# Patient Record
Sex: Female | Born: 1935
Health system: Southern US, Community
[De-identification: ages and names within clinical notes are randomized; demographics above are authoritative.]

## PROBLEM LIST (undated history)

## (undated) DIAGNOSIS — H409 Unspecified glaucoma: Secondary | ICD-10-CM

## (undated) DIAGNOSIS — J449 Chronic obstructive pulmonary disease, unspecified: Secondary | ICD-10-CM

## (undated) DIAGNOSIS — M1990 Unspecified osteoarthritis, unspecified site: Secondary | ICD-10-CM

## (undated) DIAGNOSIS — M79669 Pain in unspecified lower leg: Secondary | ICD-10-CM

## (undated) DIAGNOSIS — I739 Peripheral vascular disease, unspecified: Secondary | ICD-10-CM

## (undated) DIAGNOSIS — D509 Iron deficiency anemia, unspecified: Secondary | ICD-10-CM

## (undated) DIAGNOSIS — E119 Type 2 diabetes mellitus without complications: Secondary | ICD-10-CM

## (undated) DIAGNOSIS — C801 Malignant (primary) neoplasm, unspecified: Secondary | ICD-10-CM

## (undated) DIAGNOSIS — L930 Discoid lupus erythematosus: Secondary | ICD-10-CM

## (undated) DIAGNOSIS — J45909 Unspecified asthma, uncomplicated: Secondary | ICD-10-CM

## (undated) DIAGNOSIS — G4730 Sleep apnea, unspecified: Secondary | ICD-10-CM

## (undated) DIAGNOSIS — K219 Gastro-esophageal reflux disease without esophagitis: Secondary | ICD-10-CM

## (undated) DIAGNOSIS — I639 Cerebral infarction, unspecified: Secondary | ICD-10-CM

## (undated) DIAGNOSIS — M7989 Other specified soft tissue disorders: Secondary | ICD-10-CM

## (undated) DIAGNOSIS — J349 Unspecified disorder of nose and nasal sinuses: Secondary | ICD-10-CM

## (undated) DIAGNOSIS — E785 Hyperlipidemia, unspecified: Secondary | ICD-10-CM

## (undated) DIAGNOSIS — I10 Essential (primary) hypertension: Secondary | ICD-10-CM

## (undated) DIAGNOSIS — M2550 Pain in unspecified joint: Secondary | ICD-10-CM

## (undated) HISTORY — DX: Unspecified osteoarthritis, unspecified site: M19.90

## (undated) HISTORY — DX: Pain in unspecified lower leg: M79.669

## (undated) HISTORY — DX: Chronic obstructive pulmonary disease, unspecified: J44.9

## (undated) HISTORY — DX: Type 2 diabetes mellitus without complications: E11.9

## (undated) HISTORY — DX: Cerebral infarction, unspecified: I63.9

## (undated) HISTORY — DX: Iron deficiency anemia, unspecified: D50.9

## (undated) HISTORY — PX: EYE SURGERY: SHX253

## (undated) HISTORY — PX: BREAST SURGERY: SHX581

## (undated) HISTORY — DX: Sleep apnea, unspecified: G47.30

## (undated) HISTORY — DX: Hyperlipidemia, unspecified: E78.5

## (undated) HISTORY — DX: Discoid lupus erythematosus: L93.0

## (undated) HISTORY — DX: Essential (primary) hypertension: I10

## (undated) HISTORY — DX: Peripheral vascular disease, unspecified: I73.9

## (undated) HISTORY — DX: Gastro-esophageal reflux disease without esophagitis: K21.9

## (undated) HISTORY — PX: STENT PLACEMENT VASCULAR (ARMC HX): HXRAD1737

## (undated) HISTORY — PX: CAROTID ENDARTERECTOMY: SUR193

## (undated) HISTORY — DX: Unspecified disorder of nose and nasal sinuses: J34.9

## (undated) HISTORY — DX: Pain in unspecified joint: M25.50

## (undated) HISTORY — PX: CORONARY STENT PLACEMENT: SHX1402

## (undated) HISTORY — PX: TUBAL LIGATION: SHX77

## (undated) HISTORY — DX: Unspecified glaucoma: H40.9

## (undated) HISTORY — DX: Unspecified asthma, uncomplicated: J45.909

## (undated) HISTORY — DX: Other specified soft tissue disorders: M79.89

---

## 1985-01-08 DIAGNOSIS — I639 Cerebral infarction, unspecified: Secondary | ICD-10-CM

## 1985-01-08 HISTORY — DX: Cerebral infarction, unspecified: I63.9

## 2003-07-19 ENCOUNTER — Other Ambulatory Visit: Payer: Self-pay

## 2004-05-24 ENCOUNTER — Ambulatory Visit: Payer: Self-pay | Admitting: Surgery

## 2005-04-16 ENCOUNTER — Ambulatory Visit: Payer: Self-pay

## 2005-05-28 ENCOUNTER — Ambulatory Visit: Payer: Self-pay | Admitting: Internal Medicine

## 2005-12-24 ENCOUNTER — Ambulatory Visit (HOSPITAL_COMMUNITY): Admission: RE | Admit: 2005-12-24 | Discharge: 2005-12-24 | Payer: Self-pay | Admitting: *Deleted

## 2006-02-22 ENCOUNTER — Ambulatory Visit: Payer: Self-pay | Admitting: Internal Medicine

## 2006-03-13 ENCOUNTER — Ambulatory Visit (HOSPITAL_COMMUNITY): Admission: RE | Admit: 2006-03-13 | Discharge: 2006-03-14 | Payer: Self-pay | Admitting: *Deleted

## 2006-03-15 ENCOUNTER — Ambulatory Visit: Payer: Self-pay | Admitting: *Deleted

## 2006-04-02 ENCOUNTER — Ambulatory Visit: Payer: Self-pay | Admitting: Gastroenterology

## 2006-06-20 ENCOUNTER — Ambulatory Visit: Payer: Self-pay | Admitting: Family Medicine

## 2006-09-19 ENCOUNTER — Ambulatory Visit: Payer: Self-pay | Admitting: Gastroenterology

## 2007-01-10 ENCOUNTER — Ambulatory Visit: Payer: Self-pay | Admitting: Internal Medicine

## 2007-01-27 ENCOUNTER — Ambulatory Visit: Payer: Self-pay | Admitting: Family Medicine

## 2007-02-21 ENCOUNTER — Other Ambulatory Visit: Payer: Self-pay

## 2007-02-21 ENCOUNTER — Inpatient Hospital Stay: Payer: Self-pay | Admitting: Internal Medicine

## 2007-04-30 ENCOUNTER — Ambulatory Visit: Payer: Self-pay | Admitting: Internal Medicine

## 2007-05-27 ENCOUNTER — Ambulatory Visit: Payer: Self-pay | Admitting: Vascular Surgery

## 2007-07-22 ENCOUNTER — Ambulatory Visit: Payer: Self-pay | Admitting: Internal Medicine

## 2007-11-13 ENCOUNTER — Ambulatory Visit: Payer: Self-pay | Admitting: Internal Medicine

## 2008-07-17 ENCOUNTER — Inpatient Hospital Stay: Payer: Self-pay | Admitting: Internal Medicine

## 2008-09-09 ENCOUNTER — Ambulatory Visit: Payer: Self-pay | Admitting: Internal Medicine

## 2009-02-17 ENCOUNTER — Ambulatory Visit: Payer: Self-pay | Admitting: Gastroenterology

## 2009-04-11 ENCOUNTER — Ambulatory Visit: Payer: Self-pay | Admitting: Gastroenterology

## 2009-09-22 ENCOUNTER — Ambulatory Visit: Payer: Self-pay | Admitting: Internal Medicine

## 2010-03-13 ENCOUNTER — Ambulatory Visit: Payer: Self-pay | Admitting: Internal Medicine

## 2010-10-10 ENCOUNTER — Ambulatory Visit: Payer: Self-pay | Admitting: Internal Medicine

## 2011-01-09 HISTORY — PX: CATARACT EXTRACTION: SUR2

## 2011-07-26 ENCOUNTER — Ambulatory Visit: Payer: Self-pay | Admitting: Ophthalmology

## 2011-07-26 LAB — HEMOGLOBIN: HGB: 11.4 g/dL — ABNORMAL LOW (ref 12.0–16.0)

## 2011-08-06 ENCOUNTER — Ambulatory Visit: Payer: Self-pay | Admitting: Ophthalmology

## 2011-09-27 ENCOUNTER — Emergency Department: Payer: Self-pay | Admitting: Emergency Medicine

## 2011-09-27 LAB — CBC WITH DIFFERENTIAL/PLATELET
Basophil #: 0.1 10*3/uL (ref 0.0–0.1)
Eosinophil #: 0.1 10*3/uL (ref 0.0–0.7)
HCT: 33.6 % — ABNORMAL LOW (ref 35.0–47.0)
HGB: 10.7 g/dL — ABNORMAL LOW (ref 12.0–16.0)
Lymphocyte #: 2.5 10*3/uL (ref 1.0–3.6)
Lymphocyte %: 22.6 %
MCH: 25 pg — ABNORMAL LOW (ref 26.0–34.0)
MCHC: 32 g/dL (ref 32.0–36.0)
MCV: 78 fL — ABNORMAL LOW (ref 80–100)
Monocyte #: 1 x10 3/mm — ABNORMAL HIGH (ref 0.2–0.9)
Monocyte %: 8.6 %
Neutrophil #: 7.5 10*3/uL — ABNORMAL HIGH (ref 1.4–6.5)
Neutrophil %: 67.1 %
Platelet: 257 10*3/uL (ref 150–440)
RBC: 4.29 10*6/uL (ref 3.80–5.20)
RDW: 16.2 % — ABNORMAL HIGH (ref 11.5–14.5)

## 2011-09-27 LAB — COMPREHENSIVE METABOLIC PANEL
Anion Gap: 8 (ref 7–16)
BUN: 24 mg/dL — ABNORMAL HIGH (ref 7–18)
Calcium, Total: 9.6 mg/dL (ref 8.5–10.1)
Chloride: 108 mmol/L — ABNORMAL HIGH (ref 98–107)
Co2: 26 mmol/L (ref 21–32)
EGFR (African American): >60
EGFR (Non-African Amer.): >60
Potassium: 3.9 mmol/L (ref 3.5–5.1)
SGOT(AST): 14 U/L — ABNORMAL LOW (ref 15–37)
Sodium: 142 mmol/L (ref 136–145)
Total Protein: 7.8 g/dL (ref 6.4–8.2)

## 2011-10-15 ENCOUNTER — Ambulatory Visit: Payer: Self-pay

## 2012-10-15 ENCOUNTER — Ambulatory Visit: Payer: Self-pay

## 2013-02-19 ENCOUNTER — Encounter: Payer: Self-pay | Admitting: Podiatrist

## 2013-03-05 ENCOUNTER — Ambulatory Visit: Payer: Self-pay | Admitting: Podiatrist

## 2013-03-19 ENCOUNTER — Ambulatory Visit (INDEPENDENT_AMBULATORY_CARE_PROVIDER_SITE_OTHER): Payer: Medicare HMO

## 2013-03-19 ENCOUNTER — Encounter: Payer: Self-pay | Admitting: Podiatrist

## 2013-03-19 ENCOUNTER — Ambulatory Visit (INDEPENDENT_AMBULATORY_CARE_PROVIDER_SITE_OTHER): Payer: Medicare HMO | Admitting: Podiatrist

## 2013-03-19 VITALS — BP 178/69 | HR 59 | Resp 16 | Ht 67.0 in | Wt 222.0 lb

## 2013-03-19 DIAGNOSIS — M79609 Pain in unspecified limb: Secondary | ICD-10-CM

## 2013-03-19 DIAGNOSIS — B351 Tinea unguium: Secondary | ICD-10-CM

## 2013-03-19 DIAGNOSIS — G629 Polyneuropathy, unspecified: Secondary | ICD-10-CM

## 2013-03-19 DIAGNOSIS — L84 Corns and callosities: Secondary | ICD-10-CM

## 2013-03-19 DIAGNOSIS — M79673 Pain in unspecified foot: Secondary | ICD-10-CM

## 2013-03-19 DIAGNOSIS — M216X9 Other acquired deformities of unspecified foot: Secondary | ICD-10-CM

## 2013-03-19 DIAGNOSIS — M19079 Primary osteoarthritis, unspecified ankle and foot: Secondary | ICD-10-CM

## 2013-03-19 DIAGNOSIS — G589 Mononeuropathy, unspecified: Secondary | ICD-10-CM

## 2013-03-19 MED ORDER — AMITRIPTYLINE HCL 25 MG PO TABS
25.0000 mg | ORAL_TABLET | Freq: Every day | ORAL | Status: DC
Start: 2013-03-19 — End: 2015-12-08

## 2013-03-19 NOTE — Patient Instructions (Signed)
I am starting you on a medication for your neuropathy- it has been called into your pharmacy for you.

## 2013-03-19 NOTE — Progress Notes (Signed)
   Subjective:    Patient ID: Jennifer Carrillo, female    DOB: [birth year], 80 y.o.   MRN: [id number]  HPI Comments: N PAIN L DORSAL B/L/ TRIM TOENAILS AND CALLUS D 4-5 MONTHS O SLOWLY C WORSE A ? T 0  Foot Pain Associated symptoms include abdominal pain and fatigue.      Review of Systems  Constitutional: Positive for appetite change and fatigue.  HENT:       SINUS PROBLEMS HEARING LOSS RINGING IN EARS EAR PAIN  Eyes: Negative.   Respiratory: Positive for shortness of breath.   Cardiovascular: Positive for leg swelling.       CALF PAIN WHEN WALKING  Gastrointestinal: Positive for abdominal pain, diarrhea and blood in stool.  Endocrine: Negative.   Genitourinary: Positive for urgency.  Musculoskeletal:       JOINT PAIN  Skin:       CHANGE IN NAILS  Allergic/Immunologic: Negative.   Neurological: Negative.   Hematological: Negative.   Psychiatric/Behavioral: Negative.        Objective:   Physical Exam Vascular exam reveals palpable pedal pulses are 1/4 DP and PT bilateral. Capillary refill time is within normal limits bilateral. Neurological sensation is decreased epicriticaly to [employer] monofilament at 2/5 sites bilaterally.  She has generalized arthritic changes bilateral feet and ankles which are uncomfortable. No specific area of pinpoint pain is noted. Generalized discomfort metatarsal region bilaterally is present. Patient's toenails are elongated, thickened, discolored, dystrophic, onychomycotic and uncomfortable per the patient. She has hyperkeratotic lesions which are painful and symptomatic bilateral feet as well. Cavus deformity bilaterally noted with forefoot prominence.      Assessment & Plan:  Neuropathy, osteoarthritis, symptomatic mycotic toenails, cavus deformity, plantarflexed metatarsals, porokeratotic lesions  Plan: X-rays were taken and showed no acute abnormalities. Generalized arthritic changes noted. Recommended Elavil and a topical pain  medication for her neuropathy. I debrided her toenails and the lesions without complication. She'll be seen back as needed for followup.

## 2013-05-18 ENCOUNTER — Observation Stay: Payer: Self-pay | Admitting: Internal Medicine

## 2013-05-18 LAB — COMPREHENSIVE METABOLIC PANEL
Albumin: 3.3 g/dL — ABNORMAL LOW (ref 3.4–5.0)
Alkaline Phosphatase: 104 U/L
Anion Gap: 6 — ABNORMAL LOW (ref 7–16)
BILIRUBIN TOTAL: 0.3 mg/dL (ref 0.2–1.0)
BUN: 25 mg/dL — AB (ref 7–18)
CHLORIDE: 109 mmol/L — AB (ref 98–107)
CO2: 28 mmol/L (ref 21–32)
Calcium, Total: 9.4 mg/dL (ref 8.5–10.1)
Creatinine: 1.35 mg/dL — ABNORMAL HIGH (ref 0.60–1.30)
GFR CALC AF AMER: 44 — AB
GFR CALC NON AF AMER: 38 — AB
GLUCOSE: 213 mg/dL — AB (ref 65–99)
OSMOLALITY: 296 (ref 275–301)
Potassium: 4.3 mmol/L (ref 3.5–5.1)
SGOT(AST): 21 U/L (ref 15–37)
SGPT (ALT): 17 U/L (ref 12–78)
SODIUM: 143 mmol/L (ref 136–145)
TOTAL PROTEIN: 8 g/dL (ref 6.4–8.2)

## 2013-05-18 LAB — CBC
HCT: 34 % — ABNORMAL LOW (ref 35.0–47.0)
HGB: 10.7 g/dL — ABNORMAL LOW (ref 12.0–16.0)
MCH: 24.4 pg — ABNORMAL LOW (ref 26.0–34.0)
MCHC: 31.6 g/dL — AB (ref 32.0–36.0)
MCV: 77 fL — ABNORMAL LOW (ref 80–100)
Platelet: 261 10*3/uL (ref 150–440)
RBC: 4.4 10*6/uL (ref 3.80–5.20)
RDW: 17 % — ABNORMAL HIGH (ref 11.5–14.5)
WBC: 8.8 10*3/uL (ref 3.6–11.0)

## 2013-05-18 LAB — HEMATOCRIT: HCT: 32.5 % — ABNORMAL LOW (ref 35.0–47.0)

## 2013-05-19 LAB — CBC WITH DIFFERENTIAL/PLATELET
HCT: 30.5 % — ABNORMAL LOW (ref 35.0–47.0)
HGB: 9.7 g/dL — ABNORMAL LOW (ref 12.0–16.0)
MCV: 78 fL — ABNORMAL LOW (ref 80–100)
Platelet: 231 10*3/uL (ref 150–440)
RBC: 3.93 10*6/uL (ref 3.80–5.20)
WBC: 9.4 10*3/uL (ref 3.6–11.0)

## 2013-05-19 LAB — BASIC METABOLIC PANEL
Anion Gap: 10 (ref 7–16)
BUN: 20 mg/dL — ABNORMAL HIGH (ref 7–18)
CALCIUM: 8.7 mg/dL (ref 8.5–10.1)
CHLORIDE: 106 mmol/L (ref 98–107)
CO2: 27 mmol/L (ref 21–32)
CREATININE: 1.09 mg/dL (ref 0.60–1.30)
EGFR (Non-African Amer.): 49 — ABNORMAL LOW
GFR CALC AF AMER: 57 — AB
GLUCOSE: 204 mg/dL — AB (ref 65–99)
Osmolality: 293 (ref 275–301)
POTASSIUM: 3.5 mmol/L (ref 3.5–5.1)
Sodium: 143 mmol/L (ref 136–145)

## 2013-05-20 LAB — CBC WITH DIFFERENTIAL/PLATELET
BASOS PCT: 1.2 %
Basophil #: 0.1 10*3/uL (ref 0.0–0.1)
EOS ABS: 0.2 10*3/uL (ref 0.0–0.7)
EOS PCT: 1.8 %
HCT: 32.4 % — ABNORMAL LOW (ref 35.0–47.0)
HGB: 10.5 g/dL — ABNORMAL LOW (ref 12.0–16.0)
Lymphocyte #: 3.9 10*3/uL — ABNORMAL HIGH (ref 1.0–3.6)
Lymphocyte %: 37 %
MCH: 25.3 pg — ABNORMAL LOW (ref 26.0–34.0)
MCHC: 32.5 g/dL (ref 32.0–36.0)
MCV: 78 fL — ABNORMAL LOW (ref 80–100)
Monocyte #: 1 x10 3/mm — ABNORMAL HIGH (ref 0.2–0.9)
Monocyte %: 9.3 %
Neutrophil #: 5.4 10*3/uL (ref 1.4–6.5)
Neutrophil %: 50.7 %
PLATELETS: 254 10*3/uL (ref 150–440)
RBC: 4.16 10*6/uL (ref 3.80–5.20)
RDW: 16.8 % — AB (ref 11.5–14.5)
WBC: 10.7 10*3/uL (ref 3.6–11.0)

## 2013-05-20 LAB — BASIC METABOLIC PANEL
Anion Gap: 4 — ABNORMAL LOW (ref 7–16)
BUN: 13 mg/dL (ref 7–18)
Calcium, Total: 8.9 mg/dL (ref 8.5–10.1)
Chloride: 108 mmol/L — ABNORMAL HIGH (ref 98–107)
Co2: 30 mmol/L (ref 21–32)
Creatinine: 0.92 mg/dL (ref 0.60–1.30)
Glucose: 108 mg/dL — ABNORMAL HIGH (ref 65–99)
OSMOLALITY: 284 (ref 275–301)
Potassium: 3.7 mmol/L (ref 3.5–5.1)
SODIUM: 142 mmol/L (ref 136–145)

## 2013-06-23 ENCOUNTER — Ambulatory Visit: Payer: Self-pay | Admitting: Gastroenterology

## 2013-10-05 ENCOUNTER — Ambulatory Visit: Payer: Self-pay

## 2014-01-08 DIAGNOSIS — C801 Malignant (primary) neoplasm, unspecified: Secondary | ICD-10-CM

## 2014-01-08 HISTORY — DX: Malignant (primary) neoplasm, unspecified: C80.1

## 2014-03-05 ENCOUNTER — Emergency Department: Payer: Self-pay | Admitting: Emergency Medicine

## 2014-03-05 DIAGNOSIS — Z7982 Long term (current) use of aspirin: Secondary | ICD-10-CM | POA: Diagnosis not present

## 2014-03-05 DIAGNOSIS — J019 Acute sinusitis, unspecified: Secondary | ICD-10-CM | POA: Diagnosis not present

## 2014-03-05 DIAGNOSIS — Z79899 Other long term (current) drug therapy: Secondary | ICD-10-CM | POA: Diagnosis not present

## 2014-03-05 DIAGNOSIS — H9209 Otalgia, unspecified ear: Secondary | ICD-10-CM | POA: Diagnosis not present

## 2014-03-05 DIAGNOSIS — R51 Headache: Secondary | ICD-10-CM | POA: Diagnosis not present

## 2014-03-05 DIAGNOSIS — I10 Essential (primary) hypertension: Secondary | ICD-10-CM | POA: Diagnosis not present

## 2014-03-05 DIAGNOSIS — J3489 Other specified disorders of nose and nasal sinuses: Secondary | ICD-10-CM | POA: Diagnosis not present

## 2014-03-05 DIAGNOSIS — Z794 Long term (current) use of insulin: Secondary | ICD-10-CM | POA: Diagnosis not present

## 2014-03-05 DIAGNOSIS — R0981 Nasal congestion: Secondary | ICD-10-CM | POA: Diagnosis not present

## 2014-03-05 DIAGNOSIS — E119 Type 2 diabetes mellitus without complications: Secondary | ICD-10-CM | POA: Diagnosis not present

## 2014-03-06 DIAGNOSIS — Z794 Long term (current) use of insulin: Secondary | ICD-10-CM | POA: Diagnosis not present

## 2014-03-06 DIAGNOSIS — R51 Headache: Secondary | ICD-10-CM | POA: Diagnosis not present

## 2014-03-06 DIAGNOSIS — I10 Essential (primary) hypertension: Secondary | ICD-10-CM | POA: Diagnosis not present

## 2014-03-06 DIAGNOSIS — J019 Acute sinusitis, unspecified: Secondary | ICD-10-CM | POA: Diagnosis not present

## 2014-03-06 DIAGNOSIS — J3489 Other specified disorders of nose and nasal sinuses: Secondary | ICD-10-CM | POA: Diagnosis not present

## 2014-03-06 DIAGNOSIS — Z7982 Long term (current) use of aspirin: Secondary | ICD-10-CM | POA: Diagnosis not present

## 2014-03-06 DIAGNOSIS — H9209 Otalgia, unspecified ear: Secondary | ICD-10-CM | POA: Diagnosis not present

## 2014-03-06 DIAGNOSIS — Z79899 Other long term (current) drug therapy: Secondary | ICD-10-CM | POA: Diagnosis not present

## 2014-03-06 DIAGNOSIS — E119 Type 2 diabetes mellitus without complications: Secondary | ICD-10-CM | POA: Diagnosis not present

## 2014-03-18 DIAGNOSIS — I10 Essential (primary) hypertension: Secondary | ICD-10-CM | POA: Diagnosis not present

## 2014-03-18 DIAGNOSIS — J449 Chronic obstructive pulmonary disease, unspecified: Secondary | ICD-10-CM | POA: Diagnosis not present

## 2014-03-18 DIAGNOSIS — E538 Deficiency of other specified B group vitamins: Secondary | ICD-10-CM | POA: Diagnosis not present

## 2014-03-18 DIAGNOSIS — K219 Gastro-esophageal reflux disease without esophagitis: Secondary | ICD-10-CM | POA: Diagnosis not present

## 2014-03-18 DIAGNOSIS — E119 Type 2 diabetes mellitus without complications: Secondary | ICD-10-CM | POA: Diagnosis not present

## 2014-03-18 DIAGNOSIS — D6481 Anemia due to antineoplastic chemotherapy: Secondary | ICD-10-CM | POA: Diagnosis not present

## 2014-04-23 DIAGNOSIS — D649 Anemia, unspecified: Secondary | ICD-10-CM | POA: Diagnosis not present

## 2014-04-23 DIAGNOSIS — K5900 Constipation, unspecified: Secondary | ICD-10-CM | POA: Diagnosis not present

## 2014-04-23 DIAGNOSIS — J449 Chronic obstructive pulmonary disease, unspecified: Secondary | ICD-10-CM | POA: Diagnosis not present

## 2014-04-23 DIAGNOSIS — E1165 Type 2 diabetes mellitus with hyperglycemia: Secondary | ICD-10-CM | POA: Diagnosis not present

## 2014-04-23 DIAGNOSIS — I10 Essential (primary) hypertension: Secondary | ICD-10-CM | POA: Diagnosis not present

## 2014-04-23 DIAGNOSIS — R944 Abnormal results of kidney function studies: Secondary | ICD-10-CM | POA: Diagnosis not present

## 2014-04-27 NOTE — Op Note (Signed)
PATIENT NAME:  Jennifer Carrillo, Jennifer Carrillo MR#:  [id number] DATE OF BIRTH:  [birth year]  DATE OF PROCEDURE:  08/06/2011  PREOPERATIVE DIAGNOSIS: Visually significant cataract of the right eye.   POSTOPERATIVE DIAGNOSIS: Visually significant cataract of the right eye.   OPERATIVE PROCEDURE: Cataract extraction by phacoemulsification with implant of intraocular lens to the right eye.   SURGEON: Birder Robson, MD.   ANESTHESIA:  1. Managed anesthesia care.  2. Topical tetracaine drops followed by 2% Xylocaine jelly applied in the preoperative holding area.   COMPLICATIONS: None.   TECHNIQUE:   Stop and chop.  DESCRIPTION OF PROCEDURE: The patient was examined and consented in the preoperative holding area where the aforementioned topical anesthesia was applied to the right eye and then brought back to the operating room where the right eye was prepped and draped in the usual sterile ophthalmic fashion and a lid speculum was placed. A paracentesis was created with the side port blade and the anterior chamber was filled with viscoelastic. A near clear corneal incision was performed with the steel keratome. A continuous curvilinear capsulorrhexis was performed with a cystotome followed by the capsulorrhexis forceps. Hydrodissection and hydrodelineation were carried out with BSS on a blunt cannula. The lens was removed in a stop and chop technique and the remaining cortical material was removed with the irrigation-aspiration handpiece. The capsular bag was inflated with viscoelastic and the [employer] ZCB00 20-diopter lens, serial number [id number] was placed in the capsular bag without complication. The remaining viscoelastic was removed from the eye with the irrigation-aspiration handpiece. The wounds were hydrated. The anterior chamber was flushed with Miostat and the eye was inflated to physiologic pressure. The wounds were found to be water tight. The eye was dressed with Vigamox. The patient was given  protective glasses to wear throughout the day and a shield with which to sleep tonight. The patient was also given drops with which to begin a drop regimen today and will follow up with me in one day.    ____________________________ Livingston Diones. Arlys Scatena, MD wlp:bjt D: 08/06/2011 20:09:33 ET T: 08/07/2011 09:39:14 ET JOB#: [id number]  cc: Gustav Knueppel L. Tehilla Coffel, MD, <Dictator> Livingston Diones Mathilda Maguire MD ELECTRONICALLY SIGNED 08/14/2011 12:49

## 2014-05-01 NOTE — H&P (Signed)
PATIENT NAME:  Jennifer Carrillo, COWLEY MR#:  [id number] DATE OF BIRTH:  08/06/1933  DATE OF ADMISSION:  05/18/2013  PRIMARY CARE PROVIDER: At [place]: Orlie Dakin, MD  CHIEF COMPLAINT: She has maroon-colored stools x 3 episodes since yesterday.   HISTORY OF PRESENT ILLNESS: The patient is a 81 year old African American female with history of previous colonoscopy in 2011 showing some internal hemorrhoids. Has a history of duodenitis. States that she has been doing well except yesterday when she started having some maroon-colored stools, initially mixed with her stool and then she was able to wipe some blood off of her rectum, and then had 2 maroon-colored episodes of stool since yesterday. She came to the ED, was seen by the ED MD, had a guaiac stool which was positive. He discussed the case with Dr. Vira Agar, who recommends the patient be admitted. Otherwise, she complains of abdominal discomfort. Denies any nausea, vomiting, diarrhea. No fevers or chills. No chest pains or shortness of breath. She does have issues with intermittent chronic constipation. She otherwise is not having any hematemesis.   PAST MEDICAL HISTORY: 1.  History of anemia in the past. 2.  History of sleep apnea, but not wearing her CPAP machine.  3.  Diabetes type 2.  4.  Hypertension.  5.  Hyperlipidemia.  6.  History of lupus.  7.  History of SVT. 8.  History of peripheral vascular disease with a stent to the lower extremity.   PAST SURGICAL HISTORY: Had some sort of a lump removal from her breast; she is not sure what it was. Status post bilateral tubal ligation. Also has a history of surgery for ectopic pregnancy.   ALLERGIES: ACE INHIBITOR AND LIPITOR.   CURRENT MEDICATIONS: She is on 70/30 with 35 units of insulin b.i.d. She is on losartan 100 daily, spironolactone 100 daily, carvedilol 25 b.i.d., amlodipine 10 daily, HCTZ 25 daily, digoxin 125 mcg daily, metformin 1000  b.i.d., aspirin 81 mg 1 tab p.o. daily,   SOCIAL HISTORY: Does not smoke. Does not drink. No drugs.   FAMILY HISTORY: Positive for hypertension.   REVIEW OF SYSTEMS:    CONSTITUTIONAL: Denies any fevers. Complains of some fatigue. No pain. No weight loss. No weight gain.  EYES: No blurred or double vision. No pain. No redness. No inflammation.  EARS, NOSE, THROAT: No tinnitus. No ear pain. No hearing loss. No seasonal or year-round allergies. No epistaxis.  RESPIRATORY: Denies any cough, wheezing. No COPD. No TB.  CARDIOVASCULAR: Denies any chest pain, orthopnea, edema, or arrhythmia.  GASTROINTESTINAL: No nausea, vomiting, diarrhea. No abdominal pain. No hematemesis. Complains of maroon-colored stools. No IBS. No jaundice.  GENITOURINARY: Denies any dysuria, hematuria, renal calculus or frequency.  ENDOCRINE: Denies any polyuria or nocturia.  HEMATOLOGIC AND LYMPHATIC: Has a history of anemia. No easy bruisability or bleeding.  SKIN: No acne. No rash.  MUSCULOSKELETAL: Pain related to osteoarthritis.  NEUROLOGIC: No CVA, TIA or seizures.  PSYCHIATRIC: No anxiety, insomnia.   PHYSICAL EXAMINATION: VITAL SIGNS: Temperature 98.3, pulse 66, respirations 18, blood pressure 192/102, O2 sat 99%.  GENERAL: The patient is a well-built, well-nourished female in no acute distress.  HEENT: Head atraumatic, normocephalic. Pupils equally round, reactive to light and accommodation. There is no conjunctival pallor. No scleral icterus. Nasal exam shows no drainage or ulceration. Oropharynx is clear without any exudate.  NECK: Supple without any JVD.  CARDIOVASCULAR: Regular rate and rhythm. No murmurs, rubs, clicks, or gallops.  LUNGS: Clear  to auscultation bilaterally without any rales, rhonchi, wheezing.  ABDOMEN: Soft, nontender, nondistended. Positive bowel sounds x 4.  EXTREMITIES: No clubbing, cyanosis, or edema.  SKIN: No rash.  LYMPHATIC: No lymph nodes palpable.  VASCULAR: Good DP, PT  pulses.  PSYCHIATRIC: Not anxious or depressed.  NEUROLOGIC: Awake, alert, oriented x 3. No focal deficits.   LABORATORY DATA: Glucose 213, BUN 25, creatinine 1.35, sodium 143, potassium 4.3, chloride 109, CO2 of 28, calcium 9.4. LFTs show albumin of 3.3; otherwise, LFTs are normal. WBC 8.8, hemoglobin 10.7, platelet count 261.   ASSESSMENT AND PLAN: The patient is a 81 year old African American female with history of internal hemorrhoids, duodenitis, presents with 3 episodes of blood in the stool. The ED MD spoke to Dr. Vira Agar, who recommends admission.  1.  Gastrointestinal bleed, likely lower: Will follow hemoglobin and hematocrit. GI consult. Transfuse as needed. Further work-up based on GI evaluation. I will place her on Protonix b.i.d. in light of her duodenitis in the past. 2,  Diabetes type 2: Will do sliding scale and 70/30 insulin. Will hold metformin in light of elevated creatinine.  3.  Hypertension: Continue losartan, carvedilol, amlodipine. I will hold HCTZ and spironolactone in light of elevated creatinine.  4.  Mild renal insufficiency: Possibly due to diuretic therapy. Will give her IV fluids. Monitor her renal function.  5.  History of supraventricular tachycardia: On an unusual regimen of digoxin, however, will continue.  6.  Peripheral vascular disease: Will hold aspirin.  7.  Miscellaneous: The patient will be on sequential compression devices for deep vein thrombosis prophylaxis.   TIME SPENT: 50 minutes.    ____________________________ Lafonda Mosses Posey Pronto, MD shp:jcm D: 05/18/2013 16:30:50 ET T: 05/18/2013 17:47:43 ET JOB#: [id number]  cc: Leilanie Rauda H. Posey Pronto, MD, <Dictator> Alric Seton MD ELECTRONICALLY SIGNED 05/22/2013 8:31

## 2014-05-01 NOTE — Consult Note (Signed)
PATIENT NAME:  Jennifer Carrillo, Jennifer Carrillo MR#:  [id number] DATE OF BIRTH:  1933-03-19  DATE OF CONSULTATION:  05/19/2013  REFERRING PHYSICIAN:  Shreyang H. Posey Pronto, MD CONSULTING PHYSICIAN:  Arther Dames, MD; Corky Sox. Tiffanee Mcnee, PA-C  REASON FOR CONSULTATION: GI bleed.   HISTORY OF PRESENT ILLNESS: This is an extremely pleasant 81 year old [place] American female who presented with maroon-colored stools beginning this past Sunday, May 10, about 2 to 3 days ago. She was admitted yesterday. Each time she has a bowel movement, she notices the maroonish-red color mixed throughout the consistency of her stool. She denies anything black. She is quite confident that the blood is mixed into the consistency, as opposed to being on the surface or on the toilet tissue. She is denying any associated abdominal pain, although periodically over this past month she has noticed an intermittent left-sided discomfort. Currently she is not experiencing any pain. There is no rectal pain. No recent straining, diarrhea or constipation. Her last colonoscopy was in 2011, notable for internal hemorrhoids, and per the patient she also has a history of diverticulosis with diverticulitis. Last EGD was about 2011 as well according to the patient, with a history of duodenitis. Per the patient, she also recalls that she may have had a history of peptic ulcer disease at that time. She does get occasional indigestion and reflux-like symptoms and is not currently on any stomach medication at home. She is tolerating a regular diet and had a full breakfast this morning without any discomfort. There is no nausea or vomiting. No dysphagia or indigestion currently. She has not yet had a bowel movement today and per the patient, this is normal for her. Her normal pattern is about every other day, and the last time she saw blood in her stool was yesterday. There is no chest pain or shortness of breath. There is no fever or chills. No lightheadedness or dizziness.  No unintentional weight changes.   PAST MEDICAL HISTORY: Sleep apnea but the patient does not utilize CPAP, dyslipidemia, hypertension, diabetes mellitus, lupus, SVT, peripheral vascular disease.   PAST SURGICAL HISTORY: A stent in the lower extremities secondary to peripheral vascular disease, breast biopsy of a benign lump, bilateral tubal ligation, ectopic pregnancy requiring surgery.   ALLERGIES: ACE INHIBITORS AND LIPITOR.   HOME MEDICATIONS: Insulin, losartan, metformin, aspirin, spironolactone, carvedilol, hydrochlorothiazide, digoxin, amlodipine.   FAMILY HISTORY: There is no known family history of GI malignancy, colon polyps or IBD.   SOCIAL HISTORY: The patient denies any alcohol, tobacco or illicit drug use. No NSAIDs outside of a daily 81 mg aspirin.   REVIEW OF SYSTEMS: Ten-system review of systems was obtained on the patient. Pertinent positives are mentioned above and otherwise negative.   OBJECTIVE: VITAL SIGNS: Blood pressure 155/62, heart rate 58, respirations 18, temperature 97.9, bedside pulse oximetry 95%.  GENERAL: This is a pleasant 81 year old female resting quietly and comfortably in bed in no acute distress. Alert and oriented x 3.  HEAD: Atraumatic, normocephalic.  NECK: Supple. No lymphadenopathy noted.  HEENT: Sclerae anicteric. Mucous membranes moist.  LUNGS: Respirations are even and unlabored. Clear to auscultation bilateral anterior lung fields.  HEART: Regular rate and rhythm. S1, S2 noted.  ABDOMEN: Soft, nontender, nondistended. Normoactive bowel sounds noted in all 4 quadrants. No guarding or rebound. No masses, hernias or organomegaly appreciated.  PSYCHIATRIC: Appropriate mood and affect.  NEUROLOGIC: Cranial nerves II through XII are grossly intact.  EXTREMITIES: Negative for lower extremity edema, 2+ pulses noted in bilateral upper extremities.  LABORATORY DATA: White blood cells 9.4; hemoglobin is 9.7, down from 10.7; hematocrit 30.5, platelets  231, MCV 78. Heme-positive stool. Sodium 143, potassium 3.5, BUN 20, creatinine 1.09, glucose 204, bilirubin 0.3, alkaline phosphatase 104, ALT 17, AST 21.   ASSESSMENT: 1.  Gastrointestinal bleed described as hematochezia with maroon-colored stools for the past 3 days.  2.  Microcytic anemia, likely secondary to the above-mentioned gastrointestinal bleed.  3.  Heme-positive stool.  4.  History of sleep apnea, diabetes, and peripheral vascular disease.  5.  History of internal hemorrhoids and diverticulosis on her last colonoscopy in 2011. Per the patient, she also has a history of duodenitis and peptic ulcer disease.   PLAN: I have discussed this patient's case in detail with Dr. Arther Dames who is involved in the development of the patient's plan of care. At this time, the overall clinical picture is suggestive of a lower GI bleed. Therefore, we do recommend keeping a close eye on her hemoglobin by checking this serially and being prepared to transfuse as necessary. We do agree with the patient being on a PPI as well. We do feel she can benefit from a repeat colonoscopy, and this can be considered inpatient versus outpatient pending what her hemoglobin continues to do over the next day or so. We will start her on a clear liquid diet today in anticipation of potentially starting her on a colon prep if necessary. This was discussed with the patient, who verbalized understanding and is in agreement with these recommendations. All questions were answered.   Thank you so much for this consultation and for allowing [place] to participate in the patient's plan of care.   ATTENDING GASTROENTEROLOGIST: Arther Dames, MD  ____________________________ Corky Sox. Bradrick Kamau, PA-C kme:jcm D: 05/19/2013 14:12:33 ET T: 05/19/2013 15:05:21 ET JOB#: [id number]  cc: Corky Sox. Mana Haberl, PA-C, <Dictator> [place] PA ELECTRONICALLY SIGNED 05/19/2013 15:36

## 2014-05-01 NOTE — Consult Note (Signed)
Details:   - GI note:  I have seen and examined Jennifer Carrillo and agree with [place]'s a/p.   maroon stool for 2 days.  Hgb 10.7 to 9.7 after fluid.    Last colon 2011.  Recs: - she has been eating today so can't do colon tomorrow.  - Let's monitior her Hgb and see if she has further bleeding. - if Hgb stable tomorrow am and no bleeding by then, will plan to perform colon as outpt - otherwise, will plan for potential colon on Thursday. - clear liquids for now.   Electronic Signatures: Arther Dames (MD)  (Signed 12-May-15 17:29)  Authored: Details   Last Updated: 12-May-15 17:29 by Arther Dames (MD)

## 2014-05-01 NOTE — Discharge Summary (Signed)
PATIENT NAME:  Jennifer Carrillo, TWIDDY MR#:  [id number] DATE OF BIRTH:  05-04-1933  DATE OF ADMISSION:  05/18/2013 DATE OF DISCHARGE:  05/20/2013  DISCHARGE DIAGNOSES: 1. Rectal bleed. 2. Hematochezia.  3. Hypertension.  4. Diabetes mellitus type 2.  5. Lupus. 6. Peripheral vascular disease. 7. Sleep apnea.   DISCHARGE MEDICATIONS:  1. Aldactone 100 mg p.o. daily.  2. Pantoprazole 40 mg p.o. daily.  3. Digoxin 125 mcg p.o. daily.  4. Metformin 1 gram p.o. b.i.d.  5. Norvasc 10 mg p.o. daily.  6. Losartan 100 mg p.o. daily.  7. Hydrochlorothiazide 25 mg p.o. daily.  8. NovoLog Mix 70/30, 30 units and 70 units 35 mL b.i.d.  9. Coreg 3.125 mg p.o. b.i.d.   FOLLOWUP: The patient with followup appointment with Dr. Rayann Heman on May 27th at 2:00 p.m. and followup appointment with Dr. Devona Konig on May 21st, Thursday, at 11:30 a.m.   CONSULTATIONS: GI consult with Dr. Arther Dames.  HOSPITAL COURSE: A 81 year old African-American female with history of colonoscopy in 2011 showing internal hemorrhoids, came in because of maroon-colored stools for 3 episodes the day before admission. The patient admitted for GI bleed. The patient's aspirin was stopped. Hemoglobin was 10.7 on admission. The patient given IV Protonix for history of duodenitis. The patient's hemoglobin stayed stable around 9.7 and did not have any further hematochezia. The patient seen by GI, Dr. Arther Dames and plans to do a colonoscopy as an outpatient unless she gets worsening bleeding. The patient started on clear liquids by GI. She tolerated the clear liquids and did not have any further GI or rectal bleeding. We advanced her diet to regular diet, and she tolerated diet, and because she did not have any further bleeding, GI recommended that she can have a colonoscopy as an outpatient. Hemoglobin at the time of discharge 10.5, hematocrit 32.4, so I told her that she can go home and follow up with GI as an outpatient for colonoscopy.    PHYSICAL EXAMINATION AT DISCHARGE: VITAL SIGNS: Temperature 97.5, heart rate 59, blood pressure 170/80, saturation is 96% on room air. GENERAL: Alert, awake, oriented, elderly female not in distress.  CARDIOVASCULAR: S1, S2 regular. No murmurs.  LUNGS: Clear to auscultation. No wheeze. No rales.  GASTROINTESTINAL: Abdomen soft, nontender, nondistended. Bowel sounds present.   DISPOSITION: The patient discharged home in stable condition. The patient advised to follow up with her primary doctor and also GI.   TIME SPENT: More than 30 minutes.   ____________________________ Epifanio Lesches, MD sk:lb D: 05/22/2013 12:10:52 ET T: 05/22/2013 12:56:27 ET JOB#: [id number]  cc: Epifanio Lesches, MD, <Dictator> Epifanio Lesches MD ELECTRONICALLY SIGNED 06/03/2013 9:48

## 2014-05-03 DIAGNOSIS — R944 Abnormal results of kidney function studies: Secondary | ICD-10-CM | POA: Diagnosis not present

## 2014-05-03 DIAGNOSIS — E1165 Type 2 diabetes mellitus with hyperglycemia: Secondary | ICD-10-CM | POA: Diagnosis not present

## 2014-05-20 DIAGNOSIS — R944 Abnormal results of kidney function studies: Secondary | ICD-10-CM | POA: Diagnosis not present

## 2014-05-20 DIAGNOSIS — I10 Essential (primary) hypertension: Secondary | ICD-10-CM | POA: Diagnosis not present

## 2014-05-20 DIAGNOSIS — E119 Type 2 diabetes mellitus without complications: Secondary | ICD-10-CM | POA: Diagnosis not present

## 2014-06-10 DIAGNOSIS — E1165 Type 2 diabetes mellitus with hyperglycemia: Secondary | ICD-10-CM | POA: Diagnosis not present

## 2014-06-10 DIAGNOSIS — Z733 Stress, not elsewhere classified: Secondary | ICD-10-CM | POA: Diagnosis not present

## 2014-06-10 DIAGNOSIS — R300 Dysuria: Secondary | ICD-10-CM | POA: Diagnosis not present

## 2014-06-10 DIAGNOSIS — I10 Essential (primary) hypertension: Secondary | ICD-10-CM | POA: Diagnosis not present

## 2014-06-10 DIAGNOSIS — Z0001 Encounter for general adult medical examination with abnormal findings: Secondary | ICD-10-CM | POA: Diagnosis not present

## 2014-10-11 DIAGNOSIS — Z9289 Personal history of other medical treatment: Secondary | ICD-10-CM | POA: Diagnosis not present

## 2014-10-11 DIAGNOSIS — Z1231 Encounter for screening mammogram for malignant neoplasm of breast: Secondary | ICD-10-CM | POA: Diagnosis not present

## 2014-10-14 DIAGNOSIS — I679 Cerebrovascular disease, unspecified: Secondary | ICD-10-CM | POA: Diagnosis not present

## 2014-10-14 DIAGNOSIS — E119 Type 2 diabetes mellitus without complications: Secondary | ICD-10-CM | POA: Diagnosis not present

## 2014-10-14 DIAGNOSIS — I10 Essential (primary) hypertension: Secondary | ICD-10-CM | POA: Diagnosis not present

## 2014-10-14 DIAGNOSIS — I482 Chronic atrial fibrillation: Secondary | ICD-10-CM | POA: Diagnosis not present

## 2014-10-14 DIAGNOSIS — N182 Chronic kidney disease, stage 2 (mild): Secondary | ICD-10-CM | POA: Diagnosis not present

## 2014-10-20 DIAGNOSIS — R0989 Other specified symptoms and signs involving the circulatory and respiratory systems: Secondary | ICD-10-CM | POA: Diagnosis not present

## 2014-10-27 ENCOUNTER — Other Ambulatory Visit: Payer: Self-pay | Admitting: Nurse Practitioner

## 2014-10-27 DIAGNOSIS — R0602 Shortness of breath: Secondary | ICD-10-CM

## 2014-10-27 DIAGNOSIS — I10 Essential (primary) hypertension: Secondary | ICD-10-CM

## 2014-11-09 ENCOUNTER — Ambulatory Visit: Payer: Self-pay

## 2015-02-03 DIAGNOSIS — M064 Inflammatory polyarthropathy: Secondary | ICD-10-CM | POA: Diagnosis not present

## 2015-02-03 DIAGNOSIS — E1165 Type 2 diabetes mellitus with hyperglycemia: Secondary | ICD-10-CM | POA: Diagnosis not present

## 2015-02-03 DIAGNOSIS — I6522 Occlusion and stenosis of left carotid artery: Secondary | ICD-10-CM | POA: Diagnosis not present

## 2015-02-03 DIAGNOSIS — I119 Hypertensive heart disease without heart failure: Secondary | ICD-10-CM | POA: Diagnosis not present

## 2015-02-03 DIAGNOSIS — R42 Dizziness and giddiness: Secondary | ICD-10-CM | POA: Diagnosis not present

## 2015-02-03 DIAGNOSIS — N182 Chronic kidney disease, stage 2 (mild): Secondary | ICD-10-CM | POA: Diagnosis not present

## 2015-05-05 DIAGNOSIS — D6481 Anemia due to antineoplastic chemotherapy: Secondary | ICD-10-CM | POA: Diagnosis not present

## 2015-05-05 DIAGNOSIS — I482 Chronic atrial fibrillation: Secondary | ICD-10-CM | POA: Diagnosis not present

## 2015-05-05 DIAGNOSIS — I119 Hypertensive heart disease without heart failure: Secondary | ICD-10-CM | POA: Diagnosis not present

## 2015-05-05 DIAGNOSIS — E1122 Type 2 diabetes mellitus with diabetic chronic kidney disease: Secondary | ICD-10-CM | POA: Diagnosis not present

## 2015-05-05 DIAGNOSIS — E538 Deficiency of other specified B group vitamins: Secondary | ICD-10-CM | POA: Diagnosis not present

## 2015-05-05 DIAGNOSIS — I10 Essential (primary) hypertension: Secondary | ICD-10-CM | POA: Diagnosis not present

## 2015-07-28 DIAGNOSIS — I10 Essential (primary) hypertension: Secondary | ICD-10-CM | POA: Diagnosis not present

## 2015-07-28 DIAGNOSIS — N182 Chronic kidney disease, stage 2 (mild): Secondary | ICD-10-CM | POA: Diagnosis not present

## 2015-07-28 DIAGNOSIS — K5900 Constipation, unspecified: Secondary | ICD-10-CM | POA: Diagnosis not present

## 2015-07-28 DIAGNOSIS — K921 Melena: Secondary | ICD-10-CM | POA: Diagnosis not present

## 2015-07-28 DIAGNOSIS — Z0001 Encounter for general adult medical examination with abnormal findings: Secondary | ICD-10-CM | POA: Diagnosis not present

## 2015-07-28 DIAGNOSIS — E119 Type 2 diabetes mellitus without complications: Secondary | ICD-10-CM | POA: Diagnosis not present

## 2015-08-05 DIAGNOSIS — K921 Melena: Secondary | ICD-10-CM | POA: Diagnosis not present

## 2015-08-05 DIAGNOSIS — D509 Iron deficiency anemia, unspecified: Secondary | ICD-10-CM | POA: Diagnosis not present

## 2015-09-29 DIAGNOSIS — K921 Melena: Secondary | ICD-10-CM | POA: Diagnosis not present

## 2015-11-01 DIAGNOSIS — G2581 Restless legs syndrome: Secondary | ICD-10-CM | POA: Diagnosis not present

## 2015-11-01 DIAGNOSIS — D649 Anemia, unspecified: Secondary | ICD-10-CM | POA: Diagnosis not present

## 2015-11-01 DIAGNOSIS — N182 Chronic kidney disease, stage 2 (mild): Secondary | ICD-10-CM | POA: Diagnosis not present

## 2015-11-01 DIAGNOSIS — I119 Hypertensive heart disease without heart failure: Secondary | ICD-10-CM | POA: Diagnosis not present

## 2015-11-01 DIAGNOSIS — R350 Frequency of micturition: Secondary | ICD-10-CM | POA: Diagnosis not present

## 2015-11-01 DIAGNOSIS — I10 Essential (primary) hypertension: Secondary | ICD-10-CM | POA: Diagnosis not present

## 2015-11-01 DIAGNOSIS — E1165 Type 2 diabetes mellitus with hyperglycemia: Secondary | ICD-10-CM | POA: Diagnosis not present

## 2015-11-10 DIAGNOSIS — Z1231 Encounter for screening mammogram for malignant neoplasm of breast: Secondary | ICD-10-CM | POA: Diagnosis not present

## 2015-11-17 ENCOUNTER — Encounter: Payer: Self-pay | Admitting: Urology

## 2015-11-17 ENCOUNTER — Ambulatory Visit (INDEPENDENT_AMBULATORY_CARE_PROVIDER_SITE_OTHER): Payer: Commercial Managed Care - HMO | Admitting: Urology

## 2015-11-17 VITALS — BP 197/72 | HR 64 | Ht 67.0 in | Wt 215.8 lb

## 2015-11-17 DIAGNOSIS — R351 Nocturia: Secondary | ICD-10-CM

## 2015-11-17 DIAGNOSIS — R350 Frequency of micturition: Secondary | ICD-10-CM | POA: Diagnosis not present

## 2015-11-17 DIAGNOSIS — N952 Postmenopausal atrophic vaginitis: Secondary | ICD-10-CM | POA: Diagnosis not present

## 2015-11-17 LAB — BLADDER SCAN AMB NON-IMAGING: SCAN RESULT: 81

## 2015-11-17 MED ORDER — ESTRADIOL 0.1 MG/GM VA CREA: TOPICAL_CREAM | VAGINAL | 12 refills | Status: DC

## 2015-11-17 MED ORDER — ESTROGENS, CONJUGATED 0.625 MG/GM VA CREA: 1.0000 | TOPICAL_CREAM | Freq: Every day | VAGINAL | 12 refills | Status: DC

## 2015-11-17 NOTE — Patient Instructions (Signed)
   I have given you two prescriptions for a vaginal estrogen cream.  Estrace and Premarin.  Please take these to your pharmacy and see which one your insurance covers.  If both are too expensive, please call the office at [phone] for an alternative.  You are given a sample of vaginal estrogen cream (Premarin) and instructed to apply 0.5mg  (pea-sized amount)  just inside the vaginal introitus with a finger-tip every night for two weeks.    I have given you a prescription for prasterone [facility]).  If you find the medication too expensive, please call the office at [phone] for an alternative.

## 2015-11-17 NOTE — Progress Notes (Signed)
11/17/2015 3:28 PM   Jennifer Carrillo 01-Oct-1933 [id number]  Referring provider: [name], MD [address] [place], [place] [address]  Chief Complaint  Patient presents with  . New Patient (Initial Visit)    urinary frequency referred by Dr. Humphrey Rolls    HPI: Patient is an 82 year old African-American female who is referred to [place] by Dr. Humphrey Rolls for urinary frequency.  Patient states that she has had urinary frequency for the last 6 to 7 years.  She states that she is having to use the restroom every hour and a half during the day.  She is experiencing nocturia 4 times.  She wears 2 depends daily for urinary incontinence. She states she loses urine without her awareness.  She is also having urgency and a weak urinary stream.   She does not have a history of urinary tract infections, STI's or injury to the bladder.   She denies dysuria, gross hematuria, suprapubic pain, back pain, abdominal pain or flank pain.  She has not had any recent fevers, chills, nausea or vomiting.   She does not have a history of nephrolithiasis, GU surgery or GU trauma.   She is not sexually active.  She is post menopausal.   She denies constipation and/or diarrhea.   She is not having pain with bladder filling.    She has not had any recent imaging studies.    She is drinking 2 to 3 glasses of water daily.   She is drinking 3 caffeinated beverages daily.  She is not drinking alcohol,  Her risk factors for incontinence and frequency are obesity, a family history of incontinence, age, caffeine, diabetes, stroke, vaginal atrophy and pelvic surgery.  She is taking ACE inhibitors and diuretics.    Her PVR was 81 mL.    PMH: Past Medical History:  Diagnosis Date  . Arthritis   . Asthma   . Calf pain   . COPD (chronic obstructive pulmonary disease) ([place])   . Diabetes ([place])   . Discoid lupus   . Gastro-esophageal reflux   . Glaucoma   . Hyperlipemia   . Hypertension   . Iron deficiency anemia    . Joint pain   . Leg swelling   . Osteoarthritis   . PVD (peripheral vascular disease) ([place])   . Sinus problem   . Sleep apnea   . Stroke [facility])     Surgical History: Past Surgical History:  Procedure Laterality Date  . BREAST SURGERY    . CAROTID ENDARTERECTOMY Right   . CATARACT EXTRACTION Right 2013  . CORONARY STENT PLACEMENT     L. L. E.  . TUBAL LIGATION      Home Medications:    Medication List       Accurate as of 11/17/15  3:28 PM. Always use your most recent med list.          amitriptyline 25 MG tablet Commonly known as:  ELAVIL Take 1 tablet (25 mg total) by mouth at bedtime.   amLODipine 10 MG tablet Commonly known as:  NORVASC Take 10 mg by mouth daily.   aspirin 81 MG tablet Take 81 mg by mouth daily.   carvedilol 25 MG tablet Commonly known as:  COREG Take 25 mg by mouth 2 (two) times daily with a meal.   conjugated estrogens vaginal cream Commonly known as:  PREMARIN Place 1 Applicatorful vaginally daily. Apply 0.5mg  (pea-sized amount)  just inside the vaginal introitus with a finger-tip every night for  two weeks and then Monday, Wednesday and Friday nights.   digoxin 0.125 MG tablet Commonly known as:  LANOXIN Take by mouth daily.   docusate sodium 100 MG capsule Commonly known as:  COLACE Take 100 mg by mouth daily as needed for mild constipation.   DRISDOL 77824 units capsule Generic drug:  ergocalciferol Take 50,000 Units by mouth once a week.   estradiol 0.1 MG/GM vaginal cream Commonly known as:  ESTRACE VAGINAL Apply 0.5mg  (pea-sized amount)  just inside the vaginal introitus with a finger-tip every night for two weeks and then Monday, Wednesday and Friday nights.   hydrochlorothiazide 25 MG tablet Commonly known as:  HYDRODIURIL Take by mouth.   lisinopril 40 MG tablet Commonly known as:  PRINIVIL,ZESTRIL Take 40 mg by mouth daily.   losartan 100 MG tablet Commonly known as:  COZAAR Take 100 mg by mouth daily.     metFORMIN 1000 MG tablet Commonly known as:  GLUCOPHAGE Take 1,000 mg by mouth 2 (two) times daily with a meal.   MYRBETRIQ 50 MG Tb24 tablet Generic drug:  mirabegron ER Take 50 mg by mouth daily.   NOVOLOG MIX 70/30 (70-30) 100 UNIT/ML injection Generic drug:  insulin aspart protamine- aspart Inject 35 Units into the skin 2 (two) times daily with a meal.   pantoprazole 40 MG tablet Commonly known as:  PROTONIX Take 40 mg by mouth daily.   rOPINIRole 0.5 MG tablet Commonly known as:  REQUIP Take 0.5 mg by mouth at bedtime.   spironolactone 100 MG tablet Commonly known as:  ALDACTONE Take 150 mg by mouth daily.   ZETIA 10 MG tablet Generic drug:  ezetimibe Take 10 mg by mouth daily.       Allergies:  Allergies  Allergen Reactions  . Benazepril Other (See Comments)  . Statins     Family History: Family History  Problem Relation Age of Onset  . Diabetes Other   . Hypertension Other   . Bladder Cancer Neg Hx   . Kidney disease Neg Hx   . Prostate cancer Neg Hx     Social History:  reports that she has quit smoking. She has never used smokeless tobacco. She reports that she does not drink alcohol or use drugs.  ROS: UROLOGY Frequent Urination?: Yes Hard to postpone urination?: Yes Burning/pain with urination?: No Get up at night to urinate?: Yes Leakage of urine?: Yes Urine stream starts and stops?: No Trouble starting stream?: No Do you have to strain to urinate?: No Blood in urine?: No Urinary tract infection?: No Sexually transmitted disease?: No Injury to kidneys or bladder?: No Painful intercourse?: No Weak stream?: Yes Currently pregnant?: No Vaginal bleeding?: No Last menstrual period?: n  Gastrointestinal Nausea?: No Vomiting?: No Indigestion/heartburn?: No Diarrhea?: No Constipation?: No  Constitutional Fever: No Night sweats?: No Weight loss?: No Fatigue?: Yes  Skin Skin rash/lesions?: No Itching?: No  Eyes Blurred  vision?: Yes Double vision?: No  Ears/Nose/Throat Sore throat?: No Sinus problems?: Yes  Hematologic/Lymphatic Swollen glands?: No Easy bruising?: No  Cardiovascular Leg swelling?: Yes Chest pain?: No  Respiratory Cough?: No Shortness of breath?: Yes  Endocrine Excessive thirst?: No  Musculoskeletal Back pain?: Yes Joint pain?: Yes  Neurological Headaches?: Yes Dizziness?: No  Psychologic Depression?: No Anxiety?: No  Physical Exam: BP (!) 197/72   Pulse 64   Ht 5\' 7"  (1.702 m)   Wt 215 lb 12.8 oz (97.9 kg)   BMI 33.80 kg/m   Constitutional: Well nourished. Alert and oriented,  No acute distress. HEENT: [place] AT, moist mucus membranes. Trachea midline, no masses. Cardiovascular: No clubbing, cyanosis, or edema. Respiratory: Normal respiratory effort, no increased work of breathing. GI: Abdomen is soft, non tender, non distended, no abdominal masses. Liver and spleen not palpable.  No hernias appreciated.  Stool sample for occult testing is not indicated.   GU: No CVA tenderness.  No bladder fullness or masses.  Atrophic external genitalia, normal pubic hair distribution, no lesions.  Normal urethral meatus, no lesions, no prolapse, no discharge.   No urethral masses, tenderness and/or tenderness. No bladder fullness, tenderness or masses. Pale vagina mucosa, poor estrogen effect, no discharge, no lesions, good pelvic support, Grade II cystocele is noted.  No rectocele noted.  Cervix and uterus are surgically absent.   No adnexal/parametria masses or tenderness noted.  Anus and perineum are without rashes or lesions.    Skin: No rashes, bruises or suspicious lesions. Lymph: No cervical or inguinal adenopathy. Neurologic: Grossly intact, no focal deficits, moving all 4 extremities. Psychiatric: Normal mood and affect.  Laboratory Data: Lab Results  Component Value Date   WBC 10.7 05/20/2013   HGB 10.5 (L) 05/20/2013   HCT 32.4 (L) 05/20/2013   MCV 78 (L)  05/20/2013   PLT 254 05/20/2013    Lab Results  Component Value Date   CREATININE 0.92 05/20/2013    Lab Results  Component Value Date   AST 21 05/18/2013   Lab Results  Component Value Date   ALT 17 05/18/2013    Pertinent Imaging: Results for ALEE, GRESSMAN (MRN [id number]) as of 11/27/2015 19:44  Ref. Range 11/17/2015 15:19  Scan Result Unknown 81    Assessment & Plan:    1. Vaginal atrophy  - I explained to the patient that when women go through menopause and her estrogen levels are severely diminished, the normal vaginal flora will change.  This is due to an increase of the vaginal canal's pH. Because of this, the vaginal canal may be colonized by bacteria from the rectum instead of the protective lactobacillus.  This accompanied by the loss of the mucus barrier with vaginal atrophy is a cause of recurrent urinary tract infections.  - In some studies, the use of vaginal estrogen cream has been demonstrated to reduce  recurrent urinary tract infections to one a year.   - Patient was given a sample of vaginal estrogen cream (Premarin) and instructed to apply 0.5mg  (pea-sized amount)  just inside the vaginal introitus with a finger-tip every night for two weeks and then Monday, Wednesday and Friday nights.  I explained to the patient that vaginally administered estrogen, which causes only a slight increase in the blood estrogen levels, have fewer contraindications and adverse systemic effects that oral HT.  - I have also given prescriptions for the Estrace cream and Premarin cream, so that the patient may carry them to the pharmacy to see which one of the branded creams would be most economical for her.  If she finds both medications cost prohibitive, she is instructed to call the office.  We can then call in a compounded vaginal estrogen cream for the patient that may be more affordable.    - She will follow up in one month for an exam.    2. Frequency  - may be due to the  vaginal atrophy  - will reassess when she returns in one month  3. Nocturia  - I explained to the patient that nocturia is often multi-factorial and difficult  to treat.  Sleeping disorders, heart conditions, peripheral vascular disease, diabetes, an enlarged prostate for men, an urethral stricture causing bladder outlet obstruction and/or certain medications can contribute to nocturia.  - I have suggested that the patient avoid caffeine after noon and alcohol in the evening.  He or she may also benefit from fluid restrictions after 6:00 in the evening and voiding just prior to bedtime.  - I have explained that research studies have showed that over 84% of patients with sleep apnea reported frequent nighttime urination.   With sleep apnea, oxygen decreases, carbon dioxide increases, the blood become more acidic, the heart rate drops and blood vessels in the lung constrict.  The body is then alerted that something is very wrong. The sleeper must wake enough to reopen the airway. By this time, the heart is racing and experiences a false signal of fluid overload. The heart excretes a hormone-like protein that tells the body to get rid of sodium and water, resulting in nocturia.  -  I also informed the patient that a recent study noted that decreasing sodium intake to 2.3 grams daily, if they don't have issues with hyponatremia, can also reduce the number of nightly voids  - The patient may benefit from a discussion with his or her primary care physician to see if he or she has risk factors for sleep apnea or other sleep disturbances and obtaining a sleep study.    Return in about 1 month (around 12/17/2015) for exam.  These notes generated with voice recognition software. I apologize for typographical errors.  Zara Council, [facility] [address], [place], [place] [address] [phone]

## 2015-11-18 ENCOUNTER — Telehealth: Payer: Self-pay | Admitting: Urology

## 2015-11-18 NOTE — Telephone Encounter (Signed)
I believe that the compounded estrogen costs the same, but would you call it in for the patient?

## 2015-11-18 NOTE — Telephone Encounter (Signed)
Patient said that the medication you gave her yesterday was to much and she wants to know what else you can giver her? She said he cost $47.00   Thanks Sharyn Lull

## 2015-11-21 ENCOUNTER — Telehealth: Payer: Self-pay | Admitting: *Deleted

## 2015-11-21 NOTE — Telephone Encounter (Signed)
Returned patient call regarding co-pay for iron infusion. Explained that there would only be a co-pay if she was seeing the doctor. I also informed her that she could be billed for co-pay if necessary.

## 2015-11-21 NOTE — Telephone Encounter (Signed)
No answer

## 2015-11-22 NOTE — Telephone Encounter (Signed)
Spoke with pt in reference to estrace cream. Pt stated that she is going to work with the pharmacy in reference to $47 payments.

## 2015-11-22 NOTE — Telephone Encounter (Signed)
LMOM

## 2015-11-30 DIAGNOSIS — R922 Inconclusive mammogram: Secondary | ICD-10-CM | POA: Diagnosis not present

## 2015-11-30 DIAGNOSIS — N6323 Unspecified lump in the left breast, lower outer quadrant: Secondary | ICD-10-CM | POA: Diagnosis not present

## 2015-11-30 DIAGNOSIS — R928 Other abnormal and inconclusive findings on diagnostic imaging of breast: Secondary | ICD-10-CM | POA: Diagnosis not present

## 2015-12-07 DIAGNOSIS — N6320 Unspecified lump in the left breast, unspecified quadrant: Secondary | ICD-10-CM | POA: Diagnosis not present

## 2015-12-07 DIAGNOSIS — C50912 Malignant neoplasm of unspecified site of left female breast: Secondary | ICD-10-CM | POA: Diagnosis not present

## 2015-12-07 DIAGNOSIS — N6323 Unspecified lump in the left breast, lower outer quadrant: Secondary | ICD-10-CM | POA: Diagnosis not present

## 2015-12-07 DIAGNOSIS — R928 Other abnormal and inconclusive findings on diagnostic imaging of breast: Secondary | ICD-10-CM | POA: Diagnosis not present

## 2015-12-08 ENCOUNTER — Inpatient Hospital Stay: Payer: Commercial Managed Care - HMO | Attending: Hematology and Oncology | Admitting: Hematology and Oncology

## 2015-12-08 ENCOUNTER — Encounter: Payer: Self-pay | Admitting: Hematology and Oncology

## 2015-12-08 ENCOUNTER — Inpatient Hospital Stay: Payer: Commercial Managed Care - HMO

## 2015-12-08 DIAGNOSIS — I10 Essential (primary) hypertension: Secondary | ICD-10-CM | POA: Diagnosis not present

## 2015-12-08 DIAGNOSIS — J45909 Unspecified asthma, uncomplicated: Secondary | ICD-10-CM | POA: Diagnosis not present

## 2015-12-08 DIAGNOSIS — Z8673 Personal history of transient ischemic attack (TIA), and cerebral infarction without residual deficits: Secondary | ICD-10-CM

## 2015-12-08 DIAGNOSIS — M1990 Unspecified osteoarthritis, unspecified site: Secondary | ICD-10-CM | POA: Diagnosis not present

## 2015-12-08 DIAGNOSIS — I739 Peripheral vascular disease, unspecified: Secondary | ICD-10-CM | POA: Diagnosis not present

## 2015-12-08 DIAGNOSIS — Z86010 Personal history of colonic polyps: Secondary | ICD-10-CM | POA: Diagnosis not present

## 2015-12-08 DIAGNOSIS — D649 Anemia, unspecified: Secondary | ICD-10-CM

## 2015-12-08 DIAGNOSIS — M129 Arthropathy, unspecified: Secondary | ICD-10-CM | POA: Insufficient documentation

## 2015-12-08 DIAGNOSIS — E785 Hyperlipidemia, unspecified: Secondary | ICD-10-CM | POA: Insufficient documentation

## 2015-12-08 DIAGNOSIS — Z87891 Personal history of nicotine dependence: Secondary | ICD-10-CM | POA: Diagnosis not present

## 2015-12-08 DIAGNOSIS — Z79899 Other long term (current) drug therapy: Secondary | ICD-10-CM | POA: Diagnosis not present

## 2015-12-08 DIAGNOSIS — K219 Gastro-esophageal reflux disease without esophagitis: Secondary | ICD-10-CM | POA: Diagnosis not present

## 2015-12-08 DIAGNOSIS — M2550 Pain in unspecified joint: Secondary | ICD-10-CM | POA: Diagnosis not present

## 2015-12-08 DIAGNOSIS — R5383 Other fatigue: Secondary | ICD-10-CM | POA: Diagnosis not present

## 2015-12-08 DIAGNOSIS — J449 Chronic obstructive pulmonary disease, unspecified: Secondary | ICD-10-CM | POA: Diagnosis not present

## 2015-12-08 DIAGNOSIS — Z7982 Long term (current) use of aspirin: Secondary | ICD-10-CM | POA: Insufficient documentation

## 2015-12-08 DIAGNOSIS — Z801 Family history of malignant neoplasm of trachea, bronchus and lung: Secondary | ICD-10-CM | POA: Insufficient documentation

## 2015-12-08 DIAGNOSIS — E119 Type 2 diabetes mellitus without complications: Secondary | ICD-10-CM | POA: Diagnosis not present

## 2015-12-08 DIAGNOSIS — K649 Unspecified hemorrhoids: Secondary | ICD-10-CM | POA: Diagnosis not present

## 2015-12-08 DIAGNOSIS — G4730 Sleep apnea, unspecified: Secondary | ICD-10-CM

## 2015-12-08 DIAGNOSIS — Z794 Long term (current) use of insulin: Secondary | ICD-10-CM | POA: Diagnosis not present

## 2015-12-08 DIAGNOSIS — F5089 Other specified eating disorder: Secondary | ICD-10-CM | POA: Diagnosis not present

## 2015-12-08 DIAGNOSIS — D509 Iron deficiency anemia, unspecified: Secondary | ICD-10-CM | POA: Diagnosis not present

## 2015-12-08 LAB — CBC WITH DIFFERENTIAL/PLATELET
Basophils Absolute: 0 10*3/uL (ref 0–0.1)
Basophils Relative: 0 %
Eosinophils Absolute: 0.1 10*3/uL (ref 0–0.7)
Eosinophils Relative: 1 %
HCT: 30.4 % — ABNORMAL LOW (ref 35.0–47.0)
Hemoglobin: 9.6 g/dL — ABNORMAL LOW (ref 12.0–16.0)
Lymphocytes Relative: 37 %
Lymphs Abs: 2.8 10*3/uL (ref 1.0–3.6)
MCH: 21.3 pg — ABNORMAL LOW (ref 26.0–34.0)
MCHC: 31.4 g/dL — ABNORMAL LOW (ref 32.0–36.0)
MCV: 67.7 fL — ABNORMAL LOW (ref 80.0–100.0)
Monocytes Absolute: 0.8 10*3/uL (ref 0.2–0.9)
Monocytes Relative: 10 %
Neutro Abs: 4 10*3/uL (ref 1.4–6.5)
Neutrophils Relative %: 52 %
Platelets: 328 10*3/uL (ref 150–440)
RBC: 4.49 MIL/uL (ref 3.80–5.20)
RDW: 20.1 % — ABNORMAL HIGH (ref 11.5–14.5)
WBC: 7.8 10*3/uL (ref 3.6–11.0)

## 2015-12-08 LAB — IRON AND TIBC
Iron: 17 ug/dL — ABNORMAL LOW (ref 28–170)
Saturation Ratios: 5 % — ABNORMAL LOW (ref 10.4–31.8)
TIBC: 331 ug/dL (ref 250–450)
UIBC: 314 ug/dL

## 2015-12-08 LAB — RETICULOCYTES
RBC.: 4.49 MIL/uL (ref 3.80–5.20)
Retic Count, Absolute: 58.4 10*3/uL (ref 19.0–183.0)
Retic Ct Pct: 1.3 % (ref 0.4–3.1)

## 2015-12-08 LAB — FERRITIN: Ferritin: 19 ng/mL (ref 11–307)

## 2015-12-08 LAB — SEDIMENTATION RATE: Sed Rate: 100 mm/hr — ABNORMAL HIGH (ref 0–30)

## 2015-12-08 NOTE — Progress Notes (Signed)
[facility] day:  12/08/2015  Chief Complaint: Jennifer Carrillo is a 82 y.o. female with low iron who is referred in consultation by Dr. Suzan Garibaldi.  HPI: The patient notes a history of iron deficiency off and on for the past 15 years.  She states her diet is "not great". She eats fruit, greens, and dried beans. She eats meat 7 days a week. She can eat ice all day. She has had ice pica for the past 7-8 years.  She notes that 2 weeks ago, she couldn't get enough ice.     She notes some hemorrhoidal bleeding once in a while. She denies any hematuria or vaginal bleeding.  She states that she had an EGD in 2015 which was "okay".  Colonoscopy on 02/17/2009 revealed polyps that were resected. Colonoscopy on 06/23/2013 revealed a single nonbleeding angioectasia. There were internal and external hemorrhoids.  She has been off and on oral iron.  She was on oral iron was about 3-1/2 years ago. Oral iron has helped. She has not taken oral iron with orange juice or vitamin C. She is interested in IV iron.   Review of available labs reveals the following:  CBC on 05/03/2014 revealed a hematocrit 33.9, hemoglobin 10.6 and MCV 74.  Creatinine was 1.08.  CBC on 05/05/2015 revealed a hematocrit of 32.2, hemoglobin 9.9, MCV 77. Creatinine was 1.21. CBC on 08/05/2015 revealed a hematocrit of 30.8, hemoglobin 9.5, and MCV 73. Creatinine was 1.19.    CBC on 11/01/2015 included a hematocrit 29.5, hemoglobin 8.9, MCV 70, platelets 379,000, white count 8400 with an ANC of 4500.  Differential was unremarkable.  Creatinine was 1.3 (CrCl 39-45 ml/min), calcium 9.6, protein 6.9, and albumin 3.5.  B12 was 410 and folate 12.8.  Ferritin was 39.  Symptomatically, she feels tired. She feels "cold".  She states that she had a left breast biopsy yesterday.   Past Medical History:  Diagnosis Date  . Arthritis   . Asthma   . Calf pain   . COPD (chronic obstructive pulmonary disease)  ([place])   . Diabetes ([place])   . Discoid lupus   . Gastro-esophageal reflux   . Glaucoma   . Hyperlipemia   . Hypertension   . Iron deficiency anemia   . Joint pain   . Leg swelling   . Osteoarthritis   . PVD (peripheral vascular disease) ([place])   . Sinus problem   . Sleep apnea   . Stroke [facility])     Past Surgical History:  Procedure Laterality Date  . BREAST SURGERY    . CAROTID ENDARTERECTOMY Right   . CATARACT EXTRACTION Right 2013  . CORONARY STENT PLACEMENT     L. L. E.  . TUBAL LIGATION      Family History  Problem Relation Age of Onset  . Heart disease Mother   . Diabetes Other   . Hypertension Other   . Diabetes Sister   . Lung cancer Brother   . Leukemia Daughter   . Bladder Cancer Neg Hx   . Kidney disease Neg Hx   . Prostate cancer Neg Hx     Social History:  reports that she has quit smoking. She has never used smokeless tobacco. She reports that she does not drink alcohol or use drugs.  She lives in [place].  The patient is  alone today.  Allergies:  Allergies  Allergen Reactions  . Benazepril Other (See Comments)  . Statins  Current Medications: Current Outpatient Prescriptions  Medication Sig Dispense Refill  . amLODipine (NORVASC) 10 MG tablet Take 10 mg by mouth daily.    Marland Kitchen aspirin 81 MG tablet Take 81 mg by mouth daily.    . carvedilol (COREG) 25 MG tablet Take 25 mg by mouth 2 (two) times daily with a meal.    . conjugated estrogens (PREMARIN) vaginal cream Place 1 Applicatorful vaginally daily. Apply 0.76m (pea-sized amount)  just inside the vaginal introitus with a finger-tip every night for two weeks and then Monday, Wednesday and Friday nights. 30 g 12  . digoxin (LANOXIN) 0.125 MG tablet Take by mouth daily.    .Marland Kitchendocusate sodium (COLACE) 100 MG capsule Take 100 mg by mouth daily as needed for mild constipation.    .Marland Kitchenestradiol (ESTRACE VAGINAL) 0.1 MG/GM vaginal cream Apply 0.537m(pea-sized amount)  just inside the vaginal introitus  with a finger-tip every night for two weeks and then Monday, Wednesday and Friday nights. 30 g 12  . insulin aspart protamine- aspart (NOVOLOG MIX 70/30) (70-30) 100 UNIT/ML injection Inject 35 Units into the skin 2 (two) times daily with a meal.    . lisinopril (PRINIVIL,ZESTRIL) 40 MG tablet Take 40 mg by mouth daily.    . Marland Kitchenosartan (COZAAR) 100 MG tablet Take 100 mg by mouth daily.    . metFORMIN (GLUCOPHAGE) 1000 MG tablet Take 1,000 mg by mouth 2 (two) times daily with a meal.    . pantoprazole (PROTONIX) 40 MG tablet Take 40 mg by mouth daily.    . Marland KitchenOPINIRole (REQUIP) 0.5 MG tablet Take 0.5 mg by mouth at bedtime.    . Marland Kitchenpironolactone (ALDACTONE) 100 MG tablet Take 150 mg by mouth daily.    . ergocalciferol (DRISDOL) 50000 UNITS capsule Take 50,000 Units by mouth once a week.    . ezetimibe (ZETIA) 10 MG tablet Take 10 mg by mouth daily.    . hydrochlorothiazide (HYDRODIURIL) 25 MG tablet Take by mouth.     No current facility-administered medications for this visit.     Review of Systems:  GENERAL:  Feels tired and cold.  No fevers, sweats or weight loss. PERFORMANCE STATUS (ECOG):  1 HEENT:  No visual changes, runny nose, sore throat, mouth sores or tenderness. Lungs: No shortness of breath or cough.  No hemoptysis. Cardiac:  No chest pain, palpitations, orthopnea, or PND. GI:  No nausea, vomiting, diarrhea, constipation, melena or hematochezia. GU:  No urgency, frequency, dysuria, or hematuria. Musculoskeletal:  No back pain.  No joint pain.  No muscle tenderness. Extremities:  No pain or swelling. Skin:  No rashes or skin changes. Neuro:  No headache, numbness or weakness, balance or coordination issues. Endocrine:  Diabetes.  No thyroid issues, hot flashes or night sweats. Psych:  No mood changes, depression or anxiety. Pain:  No focal pain. Review of systems:  All other systems reviewed and found to be negative.  Physical Exam: Blood pressure (!) 180/72, pulse 64,  temperature (!) 96.9 F (36.1 C), temperature source Tympanic, resp. rate 18, height '5\' 7"'  (1.702 m), weight 213 lb 3 oz (96.7 kg), SpO2 100 %. GENERAL:  Well developed, well nourished, woman sitting comfortably in the exam room in no acute distress. MENTAL STATUS:  Alert and oriented to person, place and time. HEAD:  Styled auburn wig.  Normocephalic, atraumatic, face symmetric, no Cushingoid features. EYES:  Brown eyes.  Pupils equal round and reactive to light and accomodation.  No conjunctivitis or scleral icterus. ENT:  Oropharynx clear without lesion.  Tongue normal. Mucous membranes moist.  RESPIRATORY:  Clear to auscultation without rales, wheezes or rhonchi. CARDIOVASCULAR:  Regular rate and rhythm without murmur, rub or gallop. ABDOMEN:  Soft, non-tender, with active bowel sounds, and no appreciable hepatosplenomegaly.  No masses. SKIN:  No rashes, ulcers or lesions. EXTREMITIES: No edema, no skin discoloration or tenderness.  No palpable cords. LYMPH NODES: No palpable cervical, supraclavicular, axillary or inguinal adenopathy  NEUROLOGICAL: Unremarkable. PSYCH:  Appropriate.   No visits with results within 3 Day(s) from this visit.  Latest known visit with results is:  Office Visit on 11/17/2015  Component Date Value Ref Range Status  . Scan Result 11/17/2015 81   Final    Assessment:  Jennifer Carrillo is an 82 y.o. female with iron deficiency anemia.  Per her history, she has had iron derfciency for 15 years.  She has been off/on oral iron (last 3 1/2 years ago).  Diet appears good.  She denies any melena, hematochezia, hematuria, or vaginal bleeding.  She has had ice pica for 7-8 years.  EGD in 2015 which was "okay", per patient report.  Colonoscopy on 02/17/2009 revealed polyps that were resected. Colonoscopy on 06/23/2013 revealed a single nonbleeding angioectasia. There were internal and external hemorrhoids.  Labs on 10/202/2017 revealed the following:  hematocrit 29.5,  hemoglobin 8.9, MCV 70, platelets 379,000, white count 8400 with an ANC of 4500.  Creatinine was 1.3 (CrCl 39-45 ml/min).  B12 and folate were normal.  Ferritin was 39.  Symptomatically, she is fatigued.  She denies any melena, hematochezia, hematuria, or vaginal bleeding.  Exam is unremarkable.  Plan: 1.  Review diagnosis and management of iron deficiency anemia.  No evidence of bleeding.  Discuss diet and oral iron. Discuss IV iron.  Discuss risks and benefits.  Discuss weekly Venofer. 2.  Labs today:  CBC with diff, ferritin, iron studies, SPEP, free light chains, ESR, retic. 3.  Discuss trial of oral iron with OJ or vitamin C. 4.  Preauth IV iron (Venofer). 5.  RTC in 3 weeks for MD assessment, labs (CBC with diff, ferritin, retic- day before) and +/- Venofer   Lequita Asal, MD  12/08/2015, 2:46 PM

## 2015-12-09 LAB — KAPPA/LAMBDA LIGHT CHAINS
Kappa free light chain: 64.5 mg/L — ABNORMAL HIGH (ref 3.3–19.4)
Kappa, lambda light chain ratio: 0.97 (ref 0.26–1.65)
Lambda free light chains: 66.2 mg/L — ABNORMAL HIGH (ref 5.7–26.3)

## 2015-12-12 LAB — MULTIPLE MYELOMA PANEL, SERUM
Albumin SerPl Elph-Mcnc: 3.1 g/dL (ref 2.9–4.4)
Albumin/Glob SerPl: 0.8 (ref 0.7–1.7)
Alpha 1: 0.3 g/dL (ref 0.0–0.4)
Alpha2 Glob SerPl Elph-Mcnc: 0.9 g/dL (ref 0.4–1.0)
B-Globulin SerPl Elph-Mcnc: 1.5 g/dL — ABNORMAL HIGH (ref 0.7–1.3)
Gamma Glob SerPl Elph-Mcnc: 1.2 g/dL (ref 0.4–1.8)
Globulin, Total: 3.9 g/dL (ref 2.2–3.9)
IgA: 493 mg/dL — ABNORMAL HIGH (ref 64–422)
IgG (Immunoglobin G), Serum: 1356 mg/dL (ref 700–1600)
IgM, Serum: 80 mg/dL (ref 26–217)
Total Protein ELP: 7 g/dL (ref 6.0–8.5)

## 2015-12-14 DIAGNOSIS — C50919 Malignant neoplasm of unspecified site of unspecified female breast: Secondary | ICD-10-CM | POA: Diagnosis not present

## 2015-12-14 DIAGNOSIS — Z170 Estrogen receptor positive status [ER+]: Secondary | ICD-10-CM | POA: Diagnosis not present

## 2015-12-16 DIAGNOSIS — C50919 Malignant neoplasm of unspecified site of unspecified female breast: Secondary | ICD-10-CM | POA: Insufficient documentation

## 2015-12-16 DIAGNOSIS — Z170 Estrogen receptor positive status [ER+]: Secondary | ICD-10-CM

## 2015-12-16 DIAGNOSIS — C50912 Malignant neoplasm of unspecified site of left female breast: Secondary | ICD-10-CM | POA: Diagnosis not present

## 2015-12-19 ENCOUNTER — Ambulatory Visit: Payer: Commercial Managed Care - HMO | Admitting: Urology

## 2015-12-20 DIAGNOSIS — C50919 Malignant neoplasm of unspecified site of unspecified female breast: Secondary | ICD-10-CM | POA: Insufficient documentation

## 2015-12-20 DIAGNOSIS — C50912 Malignant neoplasm of unspecified site of left female breast: Secondary | ICD-10-CM | POA: Diagnosis not present

## 2015-12-20 DIAGNOSIS — Z6831 Body mass index (BMI) 31.0-31.9, adult: Secondary | ICD-10-CM | POA: Diagnosis not present

## 2015-12-20 DIAGNOSIS — Z170 Estrogen receptor positive status [ER+]: Secondary | ICD-10-CM | POA: Diagnosis not present

## 2015-12-20 DIAGNOSIS — E119 Type 2 diabetes mellitus without complications: Secondary | ICD-10-CM | POA: Diagnosis not present

## 2015-12-20 DIAGNOSIS — Z87891 Personal history of nicotine dependence: Secondary | ICD-10-CM | POA: Diagnosis not present

## 2015-12-20 DIAGNOSIS — I2510 Atherosclerotic heart disease of native coronary artery without angina pectoris: Secondary | ICD-10-CM | POA: Diagnosis not present

## 2015-12-20 DIAGNOSIS — I509 Heart failure, unspecified: Secondary | ICD-10-CM | POA: Diagnosis not present

## 2015-12-20 DIAGNOSIS — Z7984 Long term (current) use of oral hypoglycemic drugs: Secondary | ICD-10-CM | POA: Diagnosis not present

## 2015-12-20 DIAGNOSIS — Z79899 Other long term (current) drug therapy: Secondary | ICD-10-CM | POA: Diagnosis not present

## 2015-12-27 ENCOUNTER — Encounter: Payer: Self-pay | Admitting: Hematology and Oncology

## 2015-12-27 DIAGNOSIS — C50512 Malignant neoplasm of lower-outer quadrant of left female breast: Secondary | ICD-10-CM | POA: Diagnosis not present

## 2015-12-27 DIAGNOSIS — Z170 Estrogen receptor positive status [ER+]: Secondary | ICD-10-CM | POA: Diagnosis not present

## 2015-12-27 DIAGNOSIS — C50912 Malignant neoplasm of unspecified site of left female breast: Secondary | ICD-10-CM | POA: Diagnosis not present

## 2015-12-28 NOTE — Progress Notes (Signed)
[facility] day:  01/04/2016  Chief Complaint: Jennifer Carrillo is a 82 y.o. female with low iron who is referred in consultation by Dr. Suzan Garibaldi.  HPI: The patient notes a history of iron deficiency off and on for the past 15 years.  She states her diet is "not great". She eats fruit, greens, and dried beans. She eats meat 7 days a week. She can eat ice all day. She has had ice pica for the past 7-8 years.  She notes that 2 weeks ago, she couldn't get enough ice.     She notes some hemorrhoidal bleeding once in a while. She denies any hematuria or vaginal bleeding.  She states that she had an EGD in 2015 which was "okay".  Colonoscopy on 02/17/2009 revealed polyps that were resected. Colonoscopy on 06/23/2013 revealed a single nonbleeding angioectasia. There were internal and external hemorrhoids.  She has been off and on oral iron.  She was on oral iron was about 3-1/2 years ago. Oral iron has helped. She has not taken oral iron with orange juice or vitamin C. She is interested in IV iron.   Review of available labs reveals the following:  CBC on 05/03/2014 revealed a hematocrit 33.9, hemoglobin 10.6 and MCV 74.  Creatinine was 1.08.  CBC on 05/05/2015 revealed a hematocrit of 32.2, hemoglobin 9.9, MCV 77. Creatinine was 1.21. CBC on 08/05/2015 revealed a hematocrit of 30.8, hemoglobin 9.5, and MCV 73. Creatinine was 1.19.    CBC on 11/01/2015 included a hematocrit 29.5, hemoglobin 8.9, MCV 70, platelets 379,000, white count 8400 with an ANC of 4500.  Differential was unremarkable.  Creatinine was 1.3 (CrCl 39-45 ml/min), calcium 9.6, protein 6.9, and albumin 3.5.  B12 was 410 and folate 12.8.  Ferritin was 39.  CBC on 12/30/2015 included a hematocrit 29.1, hemoglobin 9.1, MCV 68.0, platelets 274,000, white count 7700 with an ANC of 3800.  Differential was unremarkable. Ferritin was 36.  Symptomatically, she feels tired. She feels "cold".  She states  that she had a left breast biopsy yesterday.   Past Medical History:  Diagnosis Date  . Arthritis   . Asthma   . Calf pain   . COPD (chronic obstructive pulmonary disease) ([place])   . Diabetes ([place])   . Discoid lupus   . Gastro-esophageal reflux   . Glaucoma   . Hyperlipemia   . Hypertension   . Iron deficiency anemia   . Joint pain   . Leg swelling   . Osteoarthritis   . PVD (peripheral vascular disease) ([place])   . Sinus problem   . Sleep apnea   . Stroke [facility])     Past Surgical History:  Procedure Laterality Date  . BREAST SURGERY    . CAROTID ENDARTERECTOMY Right   . CATARACT EXTRACTION Right 2013  . CORONARY STENT PLACEMENT     L. L. E.  . TUBAL LIGATION      Family History  Problem Relation Age of Onset  . Heart disease Mother   . Diabetes Other   . Hypertension Other   . Diabetes Sister   . Lung cancer Brother   . Leukemia Daughter   . Bladder Cancer Neg Hx   . Kidney disease Neg Hx   . Prostate cancer Neg Hx     Social History:  reports that she has quit smoking. She has never used smokeless tobacco. She reports that she does not drink alcohol or use drugs.  She lives in [place].  The patient is  alone today.  Allergies:  Allergies  Allergen Reactions  . Benazepril Other (See Comments)  . Statins     Current Medications: Current Outpatient Prescriptions  Medication Sig Dispense Refill  . amLODipine (NORVASC) 10 MG tablet Take 10 mg by mouth daily.    . carvedilol (COREG) 25 MG tablet Take 25 mg by mouth 2 (two) times daily with a meal.    . conjugated estrogens (PREMARIN) vaginal cream Place 1 Applicatorful vaginally daily. Apply 0.5mg  (pea-sized amount)  just inside the vaginal introitus with a finger-tip every night for two weeks and then Monday, Wednesday and Friday nights. 30 g 12  . digoxin (LANOXIN) 0.125 MG tablet Take by mouth daily.    Marland Kitchen docusate sodium (COLACE) 100 MG capsule Take 100 mg by mouth daily as needed for mild constipation.     . ergocalciferol (DRISDOL) 50000 UNITS capsule Take 50,000 Units by mouth once a week.    . estradiol (ESTRACE VAGINAL) 0.1 MG/GM vaginal cream Apply 0.5mg  (pea-sized amount)  just inside the vaginal introitus with a finger-tip every night for two weeks and then Monday, Wednesday and Friday nights. 30 g 12  . ezetimibe (ZETIA) 10 MG tablet Take 10 mg by mouth daily.    . hydrochlorothiazide (HYDRODIURIL) 25 MG tablet Take by mouth.    . insulin aspart protamine- aspart (NOVOLOG MIX 70/30) (70-30) 100 UNIT/ML injection Inject 35 Units into the skin 2 (two) times daily with a meal.    . lisinopril (PRINIVIL,ZESTRIL) 40 MG tablet Take 40 mg by mouth daily.    Marland Kitchen losartan (COZAAR) 100 MG tablet Take 100 mg by mouth daily.    . metFORMIN (GLUCOPHAGE) 1000 MG tablet Take 1,000 mg by mouth 2 (two) times daily with a meal.    . pantoprazole (PROTONIX) 40 MG tablet Take 40 mg by mouth daily.    Marland Kitchen rOPINIRole (REQUIP) 0.5 MG tablet Take 0.5 mg by mouth at bedtime.    Marland Kitchen spironolactone (ALDACTONE) 100 MG tablet Take 150 mg by mouth daily.    Marland Kitchen aspirin 81 MG tablet Take 81 mg by mouth daily.     No current facility-administered medications for this visit.     Review of Systems:  GENERAL:  Feels tired and cold.  No fevers, sweats or weight loss. PERFORMANCE STATUS (ECOG):  1 HEENT:  No visual changes, runny nose, sore throat, mouth sores or tenderness. Lungs: No shortness of breath or cough.  No hemoptysis. Cardiac:  No chest pain, palpitations, orthopnea, or PND. GI:  No nausea, vomiting, diarrhea, constipation, melena or hematochezia. GU:  No urgency, frequency, dysuria, or hematuria. Musculoskeletal:  No back pain.  No joint pain.  No muscle tenderness. Extremities:  No pain or swelling. Skin:  No rashes or skin changes. Neuro:  No headache, numbness or weakness, balance or coordination issues. Endocrine:  Diabetes.  No thyroid issues, hot flashes or night sweats. Psych:  No mood changes,  depression or anxiety. Pain:  No focal pain. Review of systems:  All other systems reviewed and found to be negative.  Physical Exam: Blood pressure (!) 174/74, pulse 60, temperature 98.6 F (37 C), temperature source Tympanic, resp. rate 18, weight 211 lb 3.2 oz (95.8 kg). GENERAL:  Well developed, well nourished, woman sitting comfortably in the exam room in no acute distress. MENTAL STATUS:  Alert and oriented to person, place and time. HEAD:  Styled [place] wig.  Normocephalic, atraumatic, face symmetric, no Cushingoid  features. EYES:  Brown eyes.  Pupils equal round and reactive to light and accomodation.  No conjunctivitis or scleral icterus. ENT:  Oropharynx clear without lesion.  Tongue normal. Mucous membranes moist.  RESPIRATORY:  Clear to auscultation without rales, wheezes or rhonchi. CARDIOVASCULAR:  Regular rate and rhythm without murmur, rub or gallop. ABDOMEN:  Soft, non-tender, with active bowel sounds, and no appreciable hepatosplenomegaly.  No masses. SKIN:  No rashes, ulcers or lesions. EXTREMITIES: No edema, no skin discoloration or tenderness.  No palpable cords. LYMPH NODES: No palpable cervical, supraclavicular, axillary or inguinal adenopathy  NEUROLOGICAL: Unremarkable. PSYCH:  Appropriate.   Appointment on 12/29/2015  Component Date Value Ref Range Status  . WBC 12/29/2015 7.7  3.6 - 11.0 K/uL Final  . RBC 12/29/2015 4.28  3.80 - 5.20 MIL/uL Final  . Hemoglobin 12/29/2015 9.1* 12.0 - 16.0 g/dL Final  . HCT 12/29/2015 29.1* 35.0 - 47.0 % Final  . MCV 12/29/2015 68.0* 80.0 - 100.0 fL Final  . MCH 12/29/2015 21.4* 26.0 - 34.0 pg Final  . MCHC 12/29/2015 31.4* 32.0 - 36.0 g/dL Final  . RDW 12/29/2015 21.1* 11.5 - 14.5 % Final  . Platelets 12/29/2015 274  150 - 440 K/uL Final  . Neutrophils Relative % 12/29/2015 50  % Final  . Neutro Abs 12/29/2015 3.8  1.4 - 6.5 K/uL Final  . Lymphocytes Relative 12/29/2015 37  % Final  . Lymphs Abs 12/29/2015 2.8  1.0 - 3.6  K/uL Final  . Monocytes Relative 12/29/2015 11  % Final  . Monocytes Absolute 12/29/2015 0.9  0.2 - 0.9 K/uL Final  . Eosinophils Relative 12/29/2015 1  % Final  . Eosinophils Absolute 12/29/2015 0.1  0 - 0.7 K/uL Final  . Basophils Relative 12/29/2015 1  % Final  . Basophils Absolute 12/29/2015 0.0  0 - 0.1 K/uL Final  . Ferritin 12/29/2015 36  11 - 307 ng/mL Final  . Retic Ct Pct 12/29/2015 1.1  0.4 - 3.1 % Final  . RBC. 12/29/2015 4.28  3.80 - 5.20 MIL/uL Final  . Retic Count, Manual 12/29/2015 47.1  19.0 - 183.0 K/uL Final    Assessment:  Jennifer Carrillo is an 82 y.o. female with iron deficiency anemia.  Per her history, she has had iron derfciency for 15 years.  She has been off/on oral iron (last 3 1/2 years ago).  Diet appears good.  She denies any melena, hematochezia, hematuria, or vaginal bleeding.  She has had ice pica for 7-8 years.  EGD in 2015 which was "okay", per patient report.  Colonoscopy on 02/17/2009 revealed polyps that were resected. Colonoscopy on 06/23/2013 revealed a single nonbleeding angioectasia. There were internal and external hemorrhoids.  Labs on 12/30/2015 revealed the following: hematocrit 29.1, hemoglobin 9.1, MCV 68.0, platelets 274,000, white count 7700 with an ANC of 3800.  Differential was unremarkable. Ferritin was 36.    Symptomatically, she feels better but still complains of fatigue.  She denies any melena, hematochezia, hematuria, or vaginal bleeding.  Exam is unremarkable.  Plan: 1.  Review diagnosis and management of iron deficiency anemia.  No evidence of bleeding. Patient is currently taking oral iron with orange juice daily. Will start 1st dose IV iron today. Will receive weekly for 4 weeks. 2.  Labs today:  CBC with diff, ferritin, iron studies 3.  Discuss trial of oral iron with OJ or vitamin C. She began taking oral iron with orange juice and is tolerating okay. She states she is taking a colace  with her PO iron and she is having no problem  with constipation. 4.  Give IV iron (Venofer) today. 5.  RTC weekly for Venofer for four weeks. 6. Return in four weeks for MD/Labs and assessment.  Faythe Casa, NP   Patient was seen and evaluated independently and I agree with the assessment and plan as dictated above.  Lloyd Huger, MD 01/04/16 1:35 PM

## 2015-12-29 ENCOUNTER — Inpatient Hospital Stay: Payer: Commercial Managed Care - HMO | Attending: Hematology and Oncology

## 2015-12-29 DIAGNOSIS — M129 Arthropathy, unspecified: Secondary | ICD-10-CM | POA: Diagnosis not present

## 2015-12-29 DIAGNOSIS — I10 Essential (primary) hypertension: Secondary | ICD-10-CM | POA: Insufficient documentation

## 2015-12-29 DIAGNOSIS — F5089 Other specified eating disorder: Secondary | ICD-10-CM | POA: Diagnosis not present

## 2015-12-29 DIAGNOSIS — D509 Iron deficiency anemia, unspecified: Secondary | ICD-10-CM | POA: Diagnosis not present

## 2015-12-29 DIAGNOSIS — G4730 Sleep apnea, unspecified: Secondary | ICD-10-CM | POA: Insufficient documentation

## 2015-12-29 DIAGNOSIS — I739 Peripheral vascular disease, unspecified: Secondary | ICD-10-CM | POA: Diagnosis not present

## 2015-12-29 DIAGNOSIS — K219 Gastro-esophageal reflux disease without esophagitis: Secondary | ICD-10-CM | POA: Insufficient documentation

## 2015-12-29 DIAGNOSIS — Z7982 Long term (current) use of aspirin: Secondary | ICD-10-CM | POA: Diagnosis not present

## 2015-12-29 DIAGNOSIS — Z8673 Personal history of transient ischemic attack (TIA), and cerebral infarction without residual deficits: Secondary | ICD-10-CM | POA: Insufficient documentation

## 2015-12-29 DIAGNOSIS — J449 Chronic obstructive pulmonary disease, unspecified: Secondary | ICD-10-CM | POA: Diagnosis not present

## 2015-12-29 DIAGNOSIS — E785 Hyperlipidemia, unspecified: Secondary | ICD-10-CM | POA: Diagnosis not present

## 2015-12-29 DIAGNOSIS — Z79899 Other long term (current) drug therapy: Secondary | ICD-10-CM | POA: Diagnosis not present

## 2015-12-29 DIAGNOSIS — E119 Type 2 diabetes mellitus without complications: Secondary | ICD-10-CM | POA: Insufficient documentation

## 2015-12-29 DIAGNOSIS — Z801 Family history of malignant neoplasm of trachea, bronchus and lung: Secondary | ICD-10-CM | POA: Diagnosis not present

## 2015-12-29 DIAGNOSIS — D649 Anemia, unspecified: Secondary | ICD-10-CM

## 2015-12-29 DIAGNOSIS — J45909 Unspecified asthma, uncomplicated: Secondary | ICD-10-CM | POA: Diagnosis not present

## 2015-12-29 DIAGNOSIS — M2550 Pain in unspecified joint: Secondary | ICD-10-CM | POA: Insufficient documentation

## 2015-12-29 LAB — RETICULOCYTES
RBC.: 4.28 MIL/uL (ref 3.80–5.20)
Retic Count, Absolute: 47.1 10*3/uL (ref 19.0–183.0)
Retic Ct Pct: 1.1 % (ref 0.4–3.1)

## 2015-12-29 LAB — CBC WITH DIFFERENTIAL/PLATELET
Basophils Absolute: 0 10*3/uL (ref 0–0.1)
Basophils Relative: 1 %
Eosinophils Absolute: 0.1 10*3/uL (ref 0–0.7)
Eosinophils Relative: 1 %
HCT: 29.1 % — ABNORMAL LOW (ref 35.0–47.0)
Hemoglobin: 9.1 g/dL — ABNORMAL LOW (ref 12.0–16.0)
Lymphocytes Relative: 37 %
Lymphs Abs: 2.8 10*3/uL (ref 1.0–3.6)
MCH: 21.4 pg — ABNORMAL LOW (ref 26.0–34.0)
MCHC: 31.4 g/dL — ABNORMAL LOW (ref 32.0–36.0)
MCV: 68 fL — ABNORMAL LOW (ref 80.0–100.0)
Monocytes Absolute: 0.9 10*3/uL (ref 0.2–0.9)
Monocytes Relative: 11 %
Neutro Abs: 3.8 10*3/uL (ref 1.4–6.5)
Neutrophils Relative %: 50 %
Platelets: 274 10*3/uL (ref 150–440)
RBC: 4.28 MIL/uL (ref 3.80–5.20)
RDW: 21.1 % — ABNORMAL HIGH (ref 11.5–14.5)
WBC: 7.7 10*3/uL (ref 3.6–11.0)

## 2015-12-29 LAB — FERRITIN: Ferritin: 36 ng/mL (ref 11–307)

## 2015-12-30 ENCOUNTER — Inpatient Hospital Stay: Payer: Commercial Managed Care - HMO

## 2015-12-30 ENCOUNTER — Inpatient Hospital Stay (HOSPITAL_BASED_OUTPATIENT_CLINIC_OR_DEPARTMENT_OTHER): Payer: Commercial Managed Care - HMO | Admitting: Oncology

## 2015-12-30 VITALS — BP 174/74 | HR 60 | Temp 98.6°F | Resp 18 | Wt 211.2 lb

## 2015-12-30 DIAGNOSIS — D509 Iron deficiency anemia, unspecified: Secondary | ICD-10-CM

## 2015-12-30 DIAGNOSIS — E785 Hyperlipidemia, unspecified: Secondary | ICD-10-CM

## 2015-12-30 DIAGNOSIS — J45909 Unspecified asthma, uncomplicated: Secondary | ICD-10-CM | POA: Diagnosis not present

## 2015-12-30 DIAGNOSIS — M129 Arthropathy, unspecified: Secondary | ICD-10-CM | POA: Diagnosis not present

## 2015-12-30 DIAGNOSIS — K219 Gastro-esophageal reflux disease without esophagitis: Secondary | ICD-10-CM | POA: Diagnosis not present

## 2015-12-30 DIAGNOSIS — Z7982 Long term (current) use of aspirin: Secondary | ICD-10-CM

## 2015-12-30 DIAGNOSIS — G4730 Sleep apnea, unspecified: Secondary | ICD-10-CM

## 2015-12-30 DIAGNOSIS — I739 Peripheral vascular disease, unspecified: Secondary | ICD-10-CM

## 2015-12-30 DIAGNOSIS — M2550 Pain in unspecified joint: Secondary | ICD-10-CM

## 2015-12-30 DIAGNOSIS — Z79899 Other long term (current) drug therapy: Secondary | ICD-10-CM | POA: Diagnosis not present

## 2015-12-30 DIAGNOSIS — Z8673 Personal history of transient ischemic attack (TIA), and cerebral infarction without residual deficits: Secondary | ICD-10-CM

## 2015-12-30 DIAGNOSIS — Z801 Family history of malignant neoplasm of trachea, bronchus and lung: Secondary | ICD-10-CM

## 2015-12-30 DIAGNOSIS — F5089 Other specified eating disorder: Secondary | ICD-10-CM | POA: Diagnosis not present

## 2015-12-30 DIAGNOSIS — E119 Type 2 diabetes mellitus without complications: Secondary | ICD-10-CM

## 2015-12-30 DIAGNOSIS — J449 Chronic obstructive pulmonary disease, unspecified: Secondary | ICD-10-CM | POA: Diagnosis not present

## 2015-12-30 DIAGNOSIS — I10 Essential (primary) hypertension: Secondary | ICD-10-CM

## 2015-12-30 MED ORDER — IRON SUCROSE 20 MG/ML IV SOLN
200.0000 mg | Freq: Once | INTRAVENOUS | Status: AC
  Administered 2015-12-30: 200 mg via INTRAVENOUS
  Filled 2015-12-30: qty 10

## 2015-12-30 MED ORDER — SODIUM CHLORIDE 0.9 % IV SOLN
Freq: Once | INTRAVENOUS | Status: AC
  Administered 2015-12-30: 11:00:00 via INTRAVENOUS
  Filled 2015-12-30: qty 1000

## 2015-12-30 MED ORDER — SODIUM CHLORIDE 0.9 % IV SOLN: 200.0000 mg | Freq: Once | INTRAVENOUS | Status: DC

## 2015-12-30 NOTE — Progress Notes (Signed)
Patient states she is here today for follow up for anemia.  Dr. Kem Parkinson patient.  States she had a tumor removed from her left breast @ [facility] last Wednesday.

## 2016-01-06 ENCOUNTER — Inpatient Hospital Stay: Payer: Commercial Managed Care - HMO

## 2016-01-06 ENCOUNTER — Inpatient Hospital Stay: Payer: Commercial Managed Care - HMO | Admitting: *Deleted

## 2016-01-06 VITALS — BP 163/72 | HR 66 | Resp 20

## 2016-01-06 DIAGNOSIS — M129 Arthropathy, unspecified: Secondary | ICD-10-CM | POA: Diagnosis not present

## 2016-01-06 DIAGNOSIS — K219 Gastro-esophageal reflux disease without esophagitis: Secondary | ICD-10-CM | POA: Diagnosis not present

## 2016-01-06 DIAGNOSIS — J45909 Unspecified asthma, uncomplicated: Secondary | ICD-10-CM | POA: Diagnosis not present

## 2016-01-06 DIAGNOSIS — D509 Iron deficiency anemia, unspecified: Secondary | ICD-10-CM | POA: Diagnosis not present

## 2016-01-06 DIAGNOSIS — E119 Type 2 diabetes mellitus without complications: Secondary | ICD-10-CM | POA: Diagnosis not present

## 2016-01-06 DIAGNOSIS — E785 Hyperlipidemia, unspecified: Secondary | ICD-10-CM | POA: Diagnosis not present

## 2016-01-06 DIAGNOSIS — J449 Chronic obstructive pulmonary disease, unspecified: Secondary | ICD-10-CM | POA: Diagnosis not present

## 2016-01-06 DIAGNOSIS — F5089 Other specified eating disorder: Secondary | ICD-10-CM | POA: Diagnosis not present

## 2016-01-06 DIAGNOSIS — Z79899 Other long term (current) drug therapy: Secondary | ICD-10-CM | POA: Diagnosis not present

## 2016-01-06 LAB — CBC WITH DIFFERENTIAL/PLATELET
Basophils Absolute: 0.1 10*3/uL (ref 0–0.1)
Basophils Relative: 1 %
Eosinophils Absolute: 0.1 10*3/uL (ref 0–0.7)
Eosinophils Relative: 1 %
HCT: 30.5 % — ABNORMAL LOW (ref 35.0–47.0)
Hemoglobin: 9.7 g/dL — ABNORMAL LOW (ref 12.0–16.0)
Lymphocytes Relative: 37 %
Lymphs Abs: 2.7 10*3/uL (ref 1.0–3.6)
MCH: 21.7 pg — ABNORMAL LOW (ref 26.0–34.0)
MCHC: 31.8 g/dL — ABNORMAL LOW (ref 32.0–36.0)
MCV: 68.1 fL — ABNORMAL LOW (ref 80.0–100.0)
Monocytes Absolute: 0.7 10*3/uL (ref 0.2–0.9)
Monocytes Relative: 10 %
Neutro Abs: 3.6 10*3/uL (ref 1.4–6.5)
Neutrophils Relative %: 51 %
Platelets: 320 10*3/uL (ref 150–440)
RBC: 4.48 MIL/uL (ref 3.80–5.20)
RDW: 21.4 % — ABNORMAL HIGH (ref 11.5–14.5)
WBC: 7.1 10*3/uL (ref 3.6–11.0)

## 2016-01-06 LAB — SEDIMENTATION RATE: Sed Rate: 79 mm/hr — ABNORMAL HIGH (ref 0–30)

## 2016-01-06 LAB — FERRITIN: Ferritin: 110 ng/mL (ref 11–307)

## 2016-01-06 MED ORDER — IRON SUCROSE 20 MG/ML IV SOLN
200.0000 mg | Freq: Once | INTRAVENOUS | Status: AC
  Administered 2016-01-06: 200 mg via INTRAVENOUS
  Filled 2016-01-06: qty 10

## 2016-01-06 MED ORDER — SODIUM CHLORIDE 0.9 % IV SOLN
Freq: Once | INTRAVENOUS | Status: AC
  Administered 2016-01-06: 15:00:00 via INTRAVENOUS
  Filled 2016-01-06: qty 1000

## 2016-01-06 MED ORDER — SODIUM CHLORIDE 0.9 % IV SOLN: 200.0000 mg | Freq: Once | INTRAVENOUS | Status: DC

## 2016-01-13 ENCOUNTER — Inpatient Hospital Stay: Payer: Medicare HMO

## 2016-01-13 ENCOUNTER — Inpatient Hospital Stay: Payer: Medicare HMO | Attending: Oncology

## 2016-01-13 DIAGNOSIS — G4730 Sleep apnea, unspecified: Secondary | ICD-10-CM | POA: Insufficient documentation

## 2016-01-13 DIAGNOSIS — M1990 Unspecified osteoarthritis, unspecified site: Secondary | ICD-10-CM | POA: Insufficient documentation

## 2016-01-13 DIAGNOSIS — E785 Hyperlipidemia, unspecified: Secondary | ICD-10-CM | POA: Insufficient documentation

## 2016-01-13 DIAGNOSIS — Z7982 Long term (current) use of aspirin: Secondary | ICD-10-CM | POA: Insufficient documentation

## 2016-01-13 DIAGNOSIS — M129 Arthropathy, unspecified: Secondary | ICD-10-CM | POA: Insufficient documentation

## 2016-01-13 DIAGNOSIS — Z806 Family history of leukemia: Secondary | ICD-10-CM | POA: Insufficient documentation

## 2016-01-13 DIAGNOSIS — D890 Polyclonal hypergammaglobulinemia: Secondary | ICD-10-CM | POA: Insufficient documentation

## 2016-01-13 DIAGNOSIS — M2550 Pain in unspecified joint: Secondary | ICD-10-CM | POA: Insufficient documentation

## 2016-01-13 DIAGNOSIS — Z801 Family history of malignant neoplasm of trachea, bronchus and lung: Secondary | ICD-10-CM | POA: Insufficient documentation

## 2016-01-13 DIAGNOSIS — Z170 Estrogen receptor positive status [ER+]: Secondary | ICD-10-CM | POA: Insufficient documentation

## 2016-01-13 DIAGNOSIS — L930 Discoid lupus erythematosus: Secondary | ICD-10-CM | POA: Insufficient documentation

## 2016-01-13 DIAGNOSIS — E119 Type 2 diabetes mellitus without complications: Secondary | ICD-10-CM | POA: Insufficient documentation

## 2016-01-13 DIAGNOSIS — Z79811 Long term (current) use of aromatase inhibitors: Secondary | ICD-10-CM | POA: Insufficient documentation

## 2016-01-13 DIAGNOSIS — M7989 Other specified soft tissue disorders: Secondary | ICD-10-CM | POA: Insufficient documentation

## 2016-01-13 DIAGNOSIS — Z79899 Other long term (current) drug therapy: Secondary | ICD-10-CM | POA: Insufficient documentation

## 2016-01-13 DIAGNOSIS — J449 Chronic obstructive pulmonary disease, unspecified: Secondary | ICD-10-CM | POA: Insufficient documentation

## 2016-01-13 DIAGNOSIS — K648 Other hemorrhoids: Secondary | ICD-10-CM | POA: Insufficient documentation

## 2016-01-13 DIAGNOSIS — Z794 Long term (current) use of insulin: Secondary | ICD-10-CM | POA: Insufficient documentation

## 2016-01-13 DIAGNOSIS — Z8673 Personal history of transient ischemic attack (TIA), and cerebral infarction without residual deficits: Secondary | ICD-10-CM | POA: Insufficient documentation

## 2016-01-13 DIAGNOSIS — C50912 Malignant neoplasm of unspecified site of left female breast: Secondary | ICD-10-CM | POA: Insufficient documentation

## 2016-01-13 DIAGNOSIS — I739 Peripheral vascular disease, unspecified: Secondary | ICD-10-CM | POA: Insufficient documentation

## 2016-01-13 DIAGNOSIS — K219 Gastro-esophageal reflux disease without esophagitis: Secondary | ICD-10-CM | POA: Insufficient documentation

## 2016-01-13 DIAGNOSIS — Z87891 Personal history of nicotine dependence: Secondary | ICD-10-CM | POA: Insufficient documentation

## 2016-01-13 DIAGNOSIS — Z9012 Acquired absence of left breast and nipple: Secondary | ICD-10-CM | POA: Insufficient documentation

## 2016-01-13 DIAGNOSIS — D509 Iron deficiency anemia, unspecified: Secondary | ICD-10-CM | POA: Insufficient documentation

## 2016-01-13 DIAGNOSIS — I10 Essential (primary) hypertension: Secondary | ICD-10-CM | POA: Insufficient documentation

## 2016-01-20 ENCOUNTER — Inpatient Hospital Stay: Payer: Medicare HMO

## 2016-01-20 DIAGNOSIS — Z9012 Acquired absence of left breast and nipple: Secondary | ICD-10-CM | POA: Diagnosis not present

## 2016-01-20 DIAGNOSIS — M1990 Unspecified osteoarthritis, unspecified site: Secondary | ICD-10-CM | POA: Diagnosis not present

## 2016-01-20 DIAGNOSIS — Z7982 Long term (current) use of aspirin: Secondary | ICD-10-CM | POA: Diagnosis not present

## 2016-01-20 DIAGNOSIS — Z87891 Personal history of nicotine dependence: Secondary | ICD-10-CM | POA: Diagnosis not present

## 2016-01-20 DIAGNOSIS — K648 Other hemorrhoids: Secondary | ICD-10-CM | POA: Diagnosis not present

## 2016-01-20 DIAGNOSIS — Z794 Long term (current) use of insulin: Secondary | ICD-10-CM | POA: Diagnosis not present

## 2016-01-20 DIAGNOSIS — D890 Polyclonal hypergammaglobulinemia: Secondary | ICD-10-CM | POA: Diagnosis not present

## 2016-01-20 DIAGNOSIS — Z8673 Personal history of transient ischemic attack (TIA), and cerebral infarction without residual deficits: Secondary | ICD-10-CM | POA: Diagnosis not present

## 2016-01-20 DIAGNOSIS — I10 Essential (primary) hypertension: Secondary | ICD-10-CM | POA: Diagnosis not present

## 2016-01-20 DIAGNOSIS — Z79811 Long term (current) use of aromatase inhibitors: Secondary | ICD-10-CM | POA: Diagnosis not present

## 2016-01-20 DIAGNOSIS — M129 Arthropathy, unspecified: Secondary | ICD-10-CM | POA: Diagnosis not present

## 2016-01-20 DIAGNOSIS — E785 Hyperlipidemia, unspecified: Secondary | ICD-10-CM | POA: Diagnosis not present

## 2016-01-20 DIAGNOSIS — D509 Iron deficiency anemia, unspecified: Secondary | ICD-10-CM

## 2016-01-20 DIAGNOSIS — Z170 Estrogen receptor positive status [ER+]: Secondary | ICD-10-CM | POA: Diagnosis not present

## 2016-01-20 DIAGNOSIS — M7989 Other specified soft tissue disorders: Secondary | ICD-10-CM | POA: Diagnosis not present

## 2016-01-20 DIAGNOSIS — C50912 Malignant neoplasm of unspecified site of left female breast: Secondary | ICD-10-CM | POA: Diagnosis not present

## 2016-01-20 DIAGNOSIS — L930 Discoid lupus erythematosus: Secondary | ICD-10-CM | POA: Diagnosis not present

## 2016-01-20 DIAGNOSIS — I739 Peripheral vascular disease, unspecified: Secondary | ICD-10-CM | POA: Diagnosis not present

## 2016-01-20 DIAGNOSIS — J449 Chronic obstructive pulmonary disease, unspecified: Secondary | ICD-10-CM | POA: Diagnosis not present

## 2016-01-20 DIAGNOSIS — G4730 Sleep apnea, unspecified: Secondary | ICD-10-CM | POA: Diagnosis not present

## 2016-01-20 DIAGNOSIS — Z79899 Other long term (current) drug therapy: Secondary | ICD-10-CM | POA: Diagnosis not present

## 2016-01-20 DIAGNOSIS — M2550 Pain in unspecified joint: Secondary | ICD-10-CM | POA: Diagnosis not present

## 2016-01-20 DIAGNOSIS — K219 Gastro-esophageal reflux disease without esophagitis: Secondary | ICD-10-CM | POA: Diagnosis not present

## 2016-01-20 DIAGNOSIS — E119 Type 2 diabetes mellitus without complications: Secondary | ICD-10-CM | POA: Diagnosis not present

## 2016-01-20 LAB — CBC WITH DIFFERENTIAL/PLATELET
Basophils Absolute: 0.1 10*3/uL (ref 0–0.1)
Basophils Relative: 1 %
Eosinophils Absolute: 0.1 10*3/uL (ref 0–0.7)
Eosinophils Relative: 1 %
HEMATOCRIT: 33.2 % — AB (ref 35.0–47.0)
Hemoglobin: 10.5 g/dL — ABNORMAL LOW (ref 12.0–16.0)
LYMPHS ABS: 3.3 10*3/uL (ref 1.0–3.6)
LYMPHS PCT: 38 %
MCH: 22 pg — AB (ref 26.0–34.0)
MCHC: 31.7 g/dL — AB (ref 32.0–36.0)
MCV: 69.6 fL — ABNORMAL LOW (ref 80.0–100.0)
MONOS PCT: 8 %
Monocytes Absolute: 0.7 10*3/uL (ref 0.2–0.9)
NEUTROS ABS: 4.5 10*3/uL (ref 1.4–6.5)
NEUTROS PCT: 52 %
Platelets: 242 10*3/uL (ref 150–440)
RBC: 4.77 MIL/uL (ref 3.80–5.20)
RDW: 23.3 % — ABNORMAL HIGH (ref 11.5–14.5)
WBC: 8.7 10*3/uL (ref 3.6–11.0)

## 2016-01-20 LAB — IRON AND TIBC
Iron: 40 ug/dL (ref 28–170)
SATURATION RATIOS: 13 % (ref 10.4–31.8)
TIBC: 305 ug/dL (ref 250–450)
UIBC: 265 ug/dL

## 2016-01-20 LAB — FERRITIN: FERRITIN: 104 ng/mL (ref 11–307)

## 2016-01-20 MED ORDER — SODIUM CHLORIDE 0.9 % IV SOLN
Freq: Once | INTRAVENOUS | Status: DC
  Filled 2016-01-20: qty 1000

## 2016-01-20 MED ORDER — IRON SUCROSE 20 MG/ML IV SOLN
200.0000 mg | Freq: Once | INTRAVENOUS | Status: DC
  Filled 2016-01-20: qty 10

## 2016-01-20 MED ORDER — SODIUM CHLORIDE 0.9 % IV SOLN: 200.0000 mg | Freq: Once | INTRAVENOUS | Status: DC

## 2016-01-24 DIAGNOSIS — D649 Anemia, unspecified: Secondary | ICD-10-CM | POA: Diagnosis not present

## 2016-01-24 DIAGNOSIS — I10 Essential (primary) hypertension: Secondary | ICD-10-CM | POA: Diagnosis not present

## 2016-01-24 DIAGNOSIS — M1990 Unspecified osteoarthritis, unspecified site: Secondary | ICD-10-CM | POA: Diagnosis not present

## 2016-01-24 DIAGNOSIS — C50512 Malignant neoplasm of lower-outer quadrant of left female breast: Secondary | ICD-10-CM | POA: Diagnosis not present

## 2016-01-24 DIAGNOSIS — M329 Systemic lupus erythematosus, unspecified: Secondary | ICD-10-CM | POA: Diagnosis not present

## 2016-01-24 DIAGNOSIS — Z9012 Acquired absence of left breast and nipple: Secondary | ICD-10-CM | POA: Diagnosis not present

## 2016-01-24 DIAGNOSIS — Z794 Long term (current) use of insulin: Secondary | ICD-10-CM | POA: Diagnosis not present

## 2016-01-24 DIAGNOSIS — E119 Type 2 diabetes mellitus without complications: Secondary | ICD-10-CM | POA: Diagnosis not present

## 2016-01-24 DIAGNOSIS — Z79811 Long term (current) use of aromatase inhibitors: Secondary | ICD-10-CM | POA: Diagnosis not present

## 2016-01-24 DIAGNOSIS — K219 Gastro-esophageal reflux disease without esophagitis: Secondary | ICD-10-CM | POA: Diagnosis not present

## 2016-01-24 DIAGNOSIS — Z170 Estrogen receptor positive status [ER+]: Secondary | ICD-10-CM | POA: Diagnosis not present

## 2016-01-27 ENCOUNTER — Other Ambulatory Visit: Payer: Self-pay | Admitting: *Deleted

## 2016-01-27 DIAGNOSIS — D508 Other iron deficiency anemias: Secondary | ICD-10-CM

## 2016-01-30 ENCOUNTER — Inpatient Hospital Stay: Payer: Medicare HMO

## 2016-01-30 ENCOUNTER — Other Ambulatory Visit: Payer: Self-pay | Admitting: Hematology and Oncology

## 2016-01-30 ENCOUNTER — Inpatient Hospital Stay (HOSPITAL_BASED_OUTPATIENT_CLINIC_OR_DEPARTMENT_OTHER): Payer: Medicare HMO | Admitting: Hematology and Oncology

## 2016-01-30 ENCOUNTER — Encounter: Payer: Self-pay | Admitting: Hematology and Oncology

## 2016-01-30 ENCOUNTER — Ambulatory Visit: Payer: Commercial Managed Care - HMO | Admitting: Oncology

## 2016-01-30 VITALS — BP 177/65 | HR 65 | Temp 95.9°F | Resp 18 | Wt 213.4 lb

## 2016-01-30 VITALS — BP 172/75 | HR 59 | Resp 18

## 2016-01-30 DIAGNOSIS — Z9012 Acquired absence of left breast and nipple: Secondary | ICD-10-CM

## 2016-01-30 DIAGNOSIS — M129 Arthropathy, unspecified: Secondary | ICD-10-CM | POA: Diagnosis not present

## 2016-01-30 DIAGNOSIS — C50912 Malignant neoplasm of unspecified site of left female breast: Secondary | ICD-10-CM

## 2016-01-30 DIAGNOSIS — E785 Hyperlipidemia, unspecified: Secondary | ICD-10-CM

## 2016-01-30 DIAGNOSIS — E119 Type 2 diabetes mellitus without complications: Secondary | ICD-10-CM | POA: Diagnosis not present

## 2016-01-30 DIAGNOSIS — Z170 Estrogen receptor positive status [ER+]: Secondary | ICD-10-CM | POA: Diagnosis not present

## 2016-01-30 DIAGNOSIS — K219 Gastro-esophageal reflux disease without esophagitis: Secondary | ICD-10-CM | POA: Diagnosis not present

## 2016-01-30 DIAGNOSIS — D509 Iron deficiency anemia, unspecified: Secondary | ICD-10-CM

## 2016-01-30 DIAGNOSIS — M1990 Unspecified osteoarthritis, unspecified site: Secondary | ICD-10-CM

## 2016-01-30 DIAGNOSIS — K648 Other hemorrhoids: Secondary | ICD-10-CM

## 2016-01-30 DIAGNOSIS — J449 Chronic obstructive pulmonary disease, unspecified: Secondary | ICD-10-CM

## 2016-01-30 DIAGNOSIS — D508 Other iron deficiency anemias: Secondary | ICD-10-CM

## 2016-01-30 DIAGNOSIS — I739 Peripheral vascular disease, unspecified: Secondary | ICD-10-CM

## 2016-01-30 DIAGNOSIS — Z79811 Long term (current) use of aromatase inhibitors: Secondary | ICD-10-CM | POA: Diagnosis not present

## 2016-01-30 DIAGNOSIS — Z794 Long term (current) use of insulin: Secondary | ICD-10-CM

## 2016-01-30 DIAGNOSIS — Z79899 Other long term (current) drug therapy: Secondary | ICD-10-CM

## 2016-01-30 DIAGNOSIS — G4730 Sleep apnea, unspecified: Secondary | ICD-10-CM

## 2016-01-30 DIAGNOSIS — Z7982 Long term (current) use of aspirin: Secondary | ICD-10-CM

## 2016-01-30 DIAGNOSIS — M2550 Pain in unspecified joint: Secondary | ICD-10-CM

## 2016-01-30 DIAGNOSIS — Z801 Family history of malignant neoplasm of trachea, bronchus and lung: Secondary | ICD-10-CM

## 2016-01-30 DIAGNOSIS — Z806 Family history of leukemia: Secondary | ICD-10-CM

## 2016-01-30 DIAGNOSIS — C50512 Malignant neoplasm of lower-outer quadrant of left female breast: Secondary | ICD-10-CM

## 2016-01-30 DIAGNOSIS — Z8673 Personal history of transient ischemic attack (TIA), and cerebral infarction without residual deficits: Secondary | ICD-10-CM

## 2016-01-30 DIAGNOSIS — D890 Polyclonal hypergammaglobulinemia: Secondary | ICD-10-CM

## 2016-01-30 DIAGNOSIS — M7989 Other specified soft tissue disorders: Secondary | ICD-10-CM

## 2016-01-30 DIAGNOSIS — I10 Essential (primary) hypertension: Secondary | ICD-10-CM

## 2016-01-30 DIAGNOSIS — Z87891 Personal history of nicotine dependence: Secondary | ICD-10-CM

## 2016-01-30 DIAGNOSIS — L930 Discoid lupus erythematosus: Secondary | ICD-10-CM | POA: Diagnosis not present

## 2016-01-30 LAB — CBC WITH DIFFERENTIAL/PLATELET
Basophils Absolute: 0 10*3/uL (ref 0–0.1)
Basophils Relative: 1 %
Eosinophils Absolute: 0.1 10*3/uL (ref 0–0.7)
Eosinophils Relative: 1 %
HCT: 33.5 % — ABNORMAL LOW (ref 35.0–47.0)
Hemoglobin: 10.6 g/dL — ABNORMAL LOW (ref 12.0–16.0)
Lymphocytes Relative: 36 %
Lymphs Abs: 2.6 10*3/uL (ref 1.0–3.6)
MCH: 22.1 pg — ABNORMAL LOW (ref 26.0–34.0)
MCHC: 31.7 g/dL — ABNORMAL LOW (ref 32.0–36.0)
MCV: 69.8 fL — ABNORMAL LOW (ref 80.0–100.0)
Monocytes Absolute: 0.8 10*3/uL (ref 0.2–0.9)
Monocytes Relative: 11 %
Neutro Abs: 3.8 10*3/uL (ref 1.4–6.5)
Neutrophils Relative %: 51 %
Platelets: 229 10*3/uL (ref 150–440)
RBC: 4.79 MIL/uL (ref 3.80–5.20)
RDW: 23.5 % — ABNORMAL HIGH (ref 11.5–14.5)
WBC: 7.3 10*3/uL (ref 3.6–11.0)

## 2016-01-30 LAB — IRON AND TIBC
Iron: 35 ug/dL (ref 28–170)
Saturation Ratios: 12 % (ref 10.4–31.8)
TIBC: 296 ug/dL (ref 250–450)
UIBC: 261 ug/dL

## 2016-01-30 LAB — SEDIMENTATION RATE: Sed Rate: 69 mm/hr — ABNORMAL HIGH (ref 0–30)

## 2016-01-30 LAB — FERRITIN: Ferritin: 78 ng/mL (ref 11–307)

## 2016-01-30 MED ORDER — SODIUM CHLORIDE 0.9 % IV SOLN
Freq: Once | INTRAVENOUS | Status: AC
  Administered 2016-01-30: 16:00:00 via INTRAVENOUS
  Filled 2016-01-30: qty 1000

## 2016-01-30 MED ORDER — IRON SUCROSE 20 MG/ML IV SOLN
200.0000 mg | Freq: Once | INTRAVENOUS | Status: AC
  Administered 2016-01-30: 200 mg via INTRAVENOUS
  Filled 2016-01-30: qty 10

## 2016-01-30 MED ORDER — IRON SUCROSE 20 MG/ML IV SOLN: 200.0000 mg | Freq: Once | INTRAVENOUS | Status: DC

## 2016-01-30 NOTE — Progress Notes (Signed)
[facility] day:  01/30/2016  Chief Complaint: Jennifer Carrillo is a 83 y.o. female with iron deficiency anemia who is seen for reassessment after IV iron.  HPI: The patient was last seen in the hematology clinic on 12/08/2015.  At that time, she was seen for initial consultation.  She was fatigued.  She denied any melena, hematochezia, hematuria, or vaginal bleeding.  We discussed oral and IV iron.  Decision was made to pursue a trial of oral iron.  Labs on 12/08/2015 included a hematocrit of 30.4, hemoglobin 9.6, and MCV 67.7.  Ferritin was 19.  Iron saturation was 5%.  Retic was 1.3%.  ESR was 100.  SPEP revealed a polyclonal gammopathy.  Kappa and lambda light chains were elevated with a normal ratio.  She saw Dr. Grayland Ormond in my absence on 12/30/2015.  CBC on 12/29/2015 revealed a hematocrit of 29.1, hemoglobin 9.1, and MCV 68.  Decision was made to pursue IV iron.  She received Venofer weekly x 2 (12/30/2015, 01/06/2016).  CBC on 01/20/2016 revealed a hematocrit of 33.2, hemoglobin 10.5, and MCV 69.6.  Ferritin was 104.  During the interim, she was diagnosed with breast cancer. Mammogram on 11/10/2015 revealed a 2 cm breast abnormality. Left breast biopsy at the 6:00 position revealed a grade I invasive ductal carcinoma. Tumor was ER+(100%), PR+ (1-2%) , and Her2/neu 2+ (FISH -).   She underwent left partial mastectomy on 12/20/2015 by Dr. Andi Devon at [facility].  Pathology revealed a 1.4 cm invasive carcinoma with associated ductal carcinoma in situ.  Final surgical margins were negative.  Pathologic stage was T1cNx.  She saw Dr. Su Grand at [facility].  Given her age, she was not benefit from radiation or chemotherapy.  She began Femara on 12/27/2015.  Symptomatically, she feels good.  She denies any breast pain.  Her ice pica has resolved.   Past Medical History:  Diagnosis Date  . Arthritis   . Asthma   . Calf pain   . COPD (chronic  obstructive pulmonary disease) ([place])   . Diabetes ([place])   . Discoid lupus   . Gastro-esophageal reflux   . Glaucoma   . Hyperlipemia   . Hypertension   . Iron deficiency anemia   . Joint pain   . Leg swelling   . Osteoarthritis   . PVD (peripheral vascular disease) ([place])   . Sinus problem   . Sleep apnea   . Stroke [facility])     Past Surgical History:  Procedure Laterality Date  . BREAST SURGERY    . CAROTID ENDARTERECTOMY Right   . CATARACT EXTRACTION Right 2013  . CORONARY STENT PLACEMENT     L. L. E.  . TUBAL LIGATION      Family History  Problem Relation Age of Onset  . Heart disease Mother   . Diabetes Other   . Hypertension Other   . Diabetes Sister   . Lung cancer Brother   . Leukemia Daughter   . Bladder Cancer Neg Hx   . Kidney disease Neg Hx   . Prostate cancer Neg Hx     Social History:  reports that she has quit smoking. She has never used smokeless tobacco. She reports that she does not drink alcohol or use drugs.  She lives in [place].  The patient is  alone today.  Allergies:  Allergies  Allergen Reactions  . Benazepril Other (See Comments)  . Statins     Current Medications: Current  Outpatient Prescriptions  Medication Sig Dispense Refill  . amLODipine (NORVASC) 10 MG tablet Take 10 mg by mouth daily.    Marland Kitchen aspirin 81 MG tablet Take 81 mg by mouth daily.    . carvedilol (COREG) 25 MG tablet Take 25 mg by mouth 2 (two) times daily with a meal.    . digoxin (LANOXIN) 0.125 MG tablet Take by mouth daily.    Marland Kitchen docusate sodium (COLACE) 100 MG capsule Take 100 mg by mouth daily as needed for mild constipation.    . ferrous sulfate 325 (65 FE) MG tablet Take 325 mg by mouth daily.    . insulin aspart protamine- aspart (NOVOLOG MIX 70/30) (70-30) 100 UNIT/ML injection Inject 35 Units into the skin 2 (two) times daily with a meal.    . lisinopril (PRINIVIL,ZESTRIL) 40 MG tablet Take 40 mg by mouth daily.    Marland Kitchen losartan (COZAAR) 100 MG tablet Take 100 mg  by mouth daily.    . metFORMIN (GLUCOPHAGE) 1000 MG tablet Take 1,000 mg by mouth 2 (two) times daily with a meal.    . rOPINIRole (REQUIP) 0.5 MG tablet Take 0.5 mg by mouth at bedtime.    . conjugated estrogens (PREMARIN) vaginal cream Place 1 Applicatorful vaginally daily. Apply 0.31m (pea-sized amount)  just inside the vaginal introitus with a finger-tip every night for two weeks and then Monday, Wednesday and Friday nights. (Patient not taking: Reported on 01/30/2016) 30 g 12  . ergocalciferol (DRISDOL) 50000 UNITS capsule Take 50,000 Units by mouth once a week.    . estradiol (ESTRACE VAGINAL) 0.1 MG/GM vaginal cream Apply 0.559m(pea-sized amount)  just inside the vaginal introitus with a finger-tip every night for two weeks and then Monday, Wednesday and Friday nights. (Patient not taking: Reported on 01/30/2016) 30 g 12  . ezetimibe (ZETIA) 10 MG tablet Take 10 mg by mouth daily.    . hydrochlorothiazide (HYDRODIURIL) 25 MG tablet Take by mouth.    . letrozole (FEMARA) 2.5 MG tablet Take 2.5 mg by mouth daily.    . pantoprazole (PROTONIX) 40 MG tablet Take 40 mg by mouth daily.    . Marland Kitchenpironolactone (ALDACTONE) 100 MG tablet Take 150 mg by mouth daily.     No current facility-administered medications for this visit.     Review of Systems:  GENERAL:  Feels good.  No fevers, sweats or weight loss. PERFORMANCE STATUS (ECOG):  1 HEENT:  No visual changes, runny nose, sore throat, mouth sores or tenderness. Lungs: No shortness of breath or cough.  No hemoptysis. Cardiac:  No chest pain, palpitations, orthopnea, or PND. GI:  No nausea, vomiting, diarrhea, constipation, melena or hematochezia. Ice pica resolved. GU:  No urgency, frequency, dysuria, or hematuria. Musculoskeletal:  No back pain.  No joint pain.  No muscle tenderness. Extremities:  No pain or swelling. Skin:  No rashes or skin changes. Neuro:  No headache, numbness or weakness, balance or coordination issues. Endocrine:   Diabetes.  No thyroid issues, hot flashes or night sweats. Psych:  No mood changes, depression or anxiety. Pain:  No focal pain. Review of systems:  All other systems reviewed and found to be negative.  Physical Exam: Blood pressure (!) 173/69, pulse 62, temperature (!) 95.9 F (35.5 C), temperature source Tympanic, resp. rate 18, weight 213 lb 6.5 oz (96.8 kg). GENERAL:  Well developed, well nourished, woman sitting comfortably in the exam room in no acute distress. MENTAL STATUS:  Alert and oriented to person, place and time.  HEAD:  Styled brown wig.  Normocephalic, atraumatic, face symmetric, no Cushingoid features. EYES:  Brown eyes.  Pupils equal round and reactive to light and accomodation.  No conjunctivitis or scleral icterus. ENT:  Oropharynx clear without lesion.  Tongue normal. Mucous membranes moist.  RESPIRATORY:  Clear to auscultation without rales, wheezes or rhonchi. CARDIOVASCULAR:  Regular rate and rhythm without murmur, rub or gallop. BREAST:  Right breast with mild fibrocystic changes.  No masses, skin changes or nipple discharge.  Left breast with post-operative edema at 6:30-7:00 position. No masses, skin changes or nipple discharge.  ABDOMEN:  Soft, non-tender, with active bowel sounds, and no appreciable hepatosplenomegaly.  No masses. SKIN:  No rashes, ulcers or lesions. EXTREMITIES: No edema, no skin discoloration or tenderness.  No palpable cords. LYMPH NODES: No palpable cervical, supraclavicular, axillary or inguinal adenopathy  NEUROLOGICAL: Unremarkable. PSYCH:  Appropriate.   Appointment on 01/30/2016  Component Date Value Ref Range Status  . WBC 01/30/2016 7.3  3.6 - 11.0 K/uL Final  . RBC 01/30/2016 4.79  3.80 - 5.20 MIL/uL Final  . Hemoglobin 01/30/2016 10.6* 12.0 - 16.0 g/dL Final  . HCT 01/30/2016 33.5* 35.0 - 47.0 % Final  . MCV 01/30/2016 69.8* 80.0 - 100.0 fL Final  . MCH 01/30/2016 22.1* 26.0 - 34.0 pg Final  . MCHC 01/30/2016 31.7* 32.0 - 36.0  g/dL Final  . RDW 01/30/2016 23.5* 11.5 - 14.5 % Final  . Platelets 01/30/2016 229  150 - 440 K/uL Final  . Neutrophils Relative % 01/30/2016 51  % Final  . Neutro Abs 01/30/2016 3.8  1.4 - 6.5 K/uL Final  . Lymphocytes Relative 01/30/2016 36  % Final  . Lymphs Abs 01/30/2016 2.6  1.0 - 3.6 K/uL Final  . Monocytes Relative 01/30/2016 11  % Final  . Monocytes Absolute 01/30/2016 0.8  0.2 - 0.9 K/uL Final  . Eosinophils Relative 01/30/2016 1  % Final  . Eosinophils Absolute 01/30/2016 0.1  0 - 0.7 K/uL Final  . Basophils Relative 01/30/2016 1  % Final  . Basophils Absolute 01/30/2016 0.0  0 - 0.1 K/uL Final  . Sed Rate 01/30/2016 69* 0 - 30 mm/hr Final    Assessment:  JEANIFER HALLIDAY is an 83 y.o. female with stage IA left breast cancer and iron deficiency anemia.   She underwent left partial mastectomy at [facility] on 12/20/2015.  Pathology revealed a 1.4 cm invasive carcinoma with associated ductal carcinoma in situ. Surgical margins were negative.   Tumor was ER+(100%), PR+ (1-2%) , and Her2/neu 2+ (FISH -).  Pathologic stage was T1cNx.  Given her age, she was not felt to  benefit from radiation or chemotherapy.  She began Femara on 12/27/2015.  She has had iron defciency for 15 years.  She has been off/on oral iron (last 3 1/2 years ago).  Diet appears good.  She denies any melena, hematochezia, hematuria, or vaginal bleeding.  She has had ice pica for 7-8 years.  EGD in 2015 which was "okay", per patient report.  Colonoscopy on 06/23/2013 revealed a single nonbleeding angioectasia. There were internal and external hemorrhoids.  Labs on 11/01/2015 revealed the following: hematocrit 29.5, hemoglobin 8.9, MCV 70, platelets 379,000, white count 8400 with an ANC of 4500.  Creatinine was 1.3 (CrCl 39-45 ml/min).  B12 and folate were normal.  Ferritin was 39.  She received 400 mg Venofer (12/30/2015 - 01/06/2016).  Ferritin has been followed: 19 on 12/08/2015, 36 on 12/29/2015, 110 on 01/06/2016,  104 on 01/20/2016,  and 78 on 01/30/2016.  ESR is elevated (69-100) which will falsely increase ferritin.  Symptomatically, she feels good.  She denies any melena, hematochezia, hematuria, or vaginal bleeding.  Ice pica has resolved.  Exam reveals post-operative changes in the left breast.  Hematocrit has improved.  Plan: 1.  Labs today:  CBC with diff, ferritin, iron studies, ESR. 2.  Discuss diagnosis, staging and management of breast cancer.  Discuss aromatase inhibitors and side effects.  Discuss bone density study. 3.  Discuss additional IV iron today.  Ferritin likely falsely elevated given increased ESR.  If RBC remain microcytic, without further increase in hemoglobin, may need to pursue hemoglobin electrophoresis to r/o thalassesmia.  Available MCVs dating back to 2013 reviewed (patient always microcytic; MCV 67.7-78). 4.  Venofer 200 mg IV 5.  Continue Femara. 6.  Bone density study. 7.  RTC in 6 weeks for labs (CBC, ferritin, ESR) 8.  RTC in 3 months for MD assess, labs (CBC with diff, ferritin, ESR, CMP, CA27.29- day before) +/- Venofer.   Lequita Asal, MD  01/30/2016, 3:03 PM

## 2016-03-06 DIAGNOSIS — I10 Essential (primary) hypertension: Secondary | ICD-10-CM | POA: Diagnosis not present

## 2016-03-06 DIAGNOSIS — C50912 Malignant neoplasm of unspecified site of left female breast: Secondary | ICD-10-CM | POA: Diagnosis not present

## 2016-03-06 DIAGNOSIS — J449 Chronic obstructive pulmonary disease, unspecified: Secondary | ICD-10-CM | POA: Diagnosis not present

## 2016-03-06 DIAGNOSIS — E1165 Type 2 diabetes mellitus with hyperglycemia: Secondary | ICD-10-CM | POA: Diagnosis not present

## 2016-03-08 DIAGNOSIS — M5136 Other intervertebral disc degeneration, lumbar region: Secondary | ICD-10-CM | POA: Diagnosis not present

## 2016-03-08 DIAGNOSIS — M7531 Calcific tendinitis of right shoulder: Secondary | ICD-10-CM | POA: Diagnosis not present

## 2016-03-08 DIAGNOSIS — M545 Low back pain: Secondary | ICD-10-CM | POA: Diagnosis not present

## 2016-03-12 ENCOUNTER — Inpatient Hospital Stay: Payer: Medicare HMO | Attending: Hematology and Oncology

## 2016-03-12 DIAGNOSIS — D509 Iron deficiency anemia, unspecified: Secondary | ICD-10-CM | POA: Insufficient documentation

## 2016-03-12 DIAGNOSIS — C50512 Malignant neoplasm of lower-outer quadrant of left female breast: Secondary | ICD-10-CM

## 2016-03-12 DIAGNOSIS — Z170 Estrogen receptor positive status [ER+]: Secondary | ICD-10-CM

## 2016-03-12 LAB — CBC WITH DIFFERENTIAL/PLATELET
Basophils Absolute: 0.1 10*3/uL (ref 0–0.1)
Basophils Relative: 1 %
Eosinophils Absolute: 0.1 10*3/uL (ref 0–0.7)
Eosinophils Relative: 1 %
HCT: 33.1 % — ABNORMAL LOW (ref 35.0–47.0)
Hemoglobin: 10.9 g/dL — ABNORMAL LOW (ref 12.0–16.0)
Lymphocytes Relative: 34 %
Lymphs Abs: 3 10*3/uL (ref 1.0–3.6)
MCH: 24.1 pg — ABNORMAL LOW (ref 26.0–34.0)
MCHC: 32.8 g/dL (ref 32.0–36.0)
MCV: 73.4 fL — ABNORMAL LOW (ref 80.0–100.0)
Monocytes Absolute: 0.8 10*3/uL (ref 0.2–0.9)
Monocytes Relative: 10 %
Neutro Abs: 4.8 10*3/uL (ref 1.4–6.5)
Neutrophils Relative %: 54 %
Platelets: 267 10*3/uL (ref 150–440)
RBC: 4.51 MIL/uL (ref 3.80–5.20)
RDW: 22.7 % — ABNORMAL HIGH (ref 11.5–14.5)
WBC: 8.8 10*3/uL (ref 3.6–11.0)

## 2016-03-12 LAB — FERRITIN: Ferritin: 93 ng/mL (ref 11–307)

## 2016-03-12 LAB — SEDIMENTATION RATE: Sed Rate: 86 mm/hr — ABNORMAL HIGH (ref 0–30)

## 2016-03-14 DIAGNOSIS — H524 Presbyopia: Secondary | ICD-10-CM | POA: Diagnosis not present

## 2016-03-14 DIAGNOSIS — E113291 Type 2 diabetes mellitus with mild nonproliferative diabetic retinopathy without macular edema, right eye: Secondary | ICD-10-CM | POA: Diagnosis not present

## 2016-03-19 DIAGNOSIS — Z780 Asymptomatic menopausal state: Secondary | ICD-10-CM | POA: Diagnosis not present

## 2016-03-20 DIAGNOSIS — H524 Presbyopia: Secondary | ICD-10-CM | POA: Diagnosis not present

## 2016-04-10 DIAGNOSIS — M2351 Chronic instability of knee, right knee: Secondary | ICD-10-CM | POA: Diagnosis not present

## 2016-04-10 DIAGNOSIS — M25572 Pain in left ankle and joints of left foot: Secondary | ICD-10-CM | POA: Diagnosis not present

## 2016-04-10 DIAGNOSIS — M1712 Unilateral primary osteoarthritis, left knee: Secondary | ICD-10-CM | POA: Diagnosis not present

## 2016-04-10 DIAGNOSIS — M19072 Primary osteoarthritis, left ankle and foot: Secondary | ICD-10-CM | POA: Diagnosis not present

## 2016-04-10 DIAGNOSIS — M19071 Primary osteoarthritis, right ankle and foot: Secondary | ICD-10-CM | POA: Diagnosis not present

## 2016-04-10 DIAGNOSIS — M25571 Pain in right ankle and joints of right foot: Secondary | ICD-10-CM | POA: Diagnosis not present

## 2016-04-10 DIAGNOSIS — M2352 Chronic instability of knee, left knee: Secondary | ICD-10-CM | POA: Diagnosis not present

## 2016-04-10 DIAGNOSIS — M1711 Unilateral primary osteoarthritis, right knee: Secondary | ICD-10-CM | POA: Diagnosis not present

## 2016-04-27 ENCOUNTER — Inpatient Hospital Stay: Payer: Medicare HMO | Attending: Hematology and Oncology

## 2016-04-27 DIAGNOSIS — C50512 Malignant neoplasm of lower-outer quadrant of left female breast: Secondary | ICD-10-CM

## 2016-04-27 DIAGNOSIS — Z87891 Personal history of nicotine dependence: Secondary | ICD-10-CM | POA: Insufficient documentation

## 2016-04-27 DIAGNOSIS — G4730 Sleep apnea, unspecified: Secondary | ICD-10-CM | POA: Insufficient documentation

## 2016-04-27 DIAGNOSIS — Z79811 Long term (current) use of aromatase inhibitors: Secondary | ICD-10-CM | POA: Diagnosis not present

## 2016-04-27 DIAGNOSIS — M129 Arthropathy, unspecified: Secondary | ICD-10-CM | POA: Insufficient documentation

## 2016-04-27 DIAGNOSIS — Z8673 Personal history of transient ischemic attack (TIA), and cerebral infarction without residual deficits: Secondary | ICD-10-CM | POA: Diagnosis not present

## 2016-04-27 DIAGNOSIS — K648 Other hemorrhoids: Secondary | ICD-10-CM | POA: Insufficient documentation

## 2016-04-27 DIAGNOSIS — I10 Essential (primary) hypertension: Secondary | ICD-10-CM | POA: Insufficient documentation

## 2016-04-27 DIAGNOSIS — Z7984 Long term (current) use of oral hypoglycemic drugs: Secondary | ICD-10-CM | POA: Insufficient documentation

## 2016-04-27 DIAGNOSIS — Z79899 Other long term (current) drug therapy: Secondary | ICD-10-CM | POA: Insufficient documentation

## 2016-04-27 DIAGNOSIS — L930 Discoid lupus erythematosus: Secondary | ICD-10-CM | POA: Insufficient documentation

## 2016-04-27 DIAGNOSIS — E785 Hyperlipidemia, unspecified: Secondary | ICD-10-CM | POA: Diagnosis not present

## 2016-04-27 DIAGNOSIS — C50912 Malignant neoplasm of unspecified site of left female breast: Secondary | ICD-10-CM | POA: Diagnosis not present

## 2016-04-27 DIAGNOSIS — E1151 Type 2 diabetes mellitus with diabetic peripheral angiopathy without gangrene: Secondary | ICD-10-CM | POA: Insufficient documentation

## 2016-04-27 DIAGNOSIS — Z809 Family history of malignant neoplasm, unspecified: Secondary | ICD-10-CM | POA: Insufficient documentation

## 2016-04-27 DIAGNOSIS — K219 Gastro-esophageal reflux disease without esophagitis: Secondary | ICD-10-CM | POA: Diagnosis not present

## 2016-04-27 DIAGNOSIS — Z7982 Long term (current) use of aspirin: Secondary | ICD-10-CM | POA: Diagnosis not present

## 2016-04-27 DIAGNOSIS — Z9012 Acquired absence of left breast and nipple: Secondary | ICD-10-CM | POA: Diagnosis not present

## 2016-04-27 DIAGNOSIS — N644 Mastodynia: Secondary | ICD-10-CM | POA: Diagnosis not present

## 2016-04-27 DIAGNOSIS — D509 Iron deficiency anemia, unspecified: Secondary | ICD-10-CM | POA: Insufficient documentation

## 2016-04-27 DIAGNOSIS — J449 Chronic obstructive pulmonary disease, unspecified: Secondary | ICD-10-CM | POA: Diagnosis not present

## 2016-04-27 DIAGNOSIS — M1990 Unspecified osteoarthritis, unspecified site: Secondary | ICD-10-CM | POA: Insufficient documentation

## 2016-04-27 DIAGNOSIS — Z170 Estrogen receptor positive status [ER+]: Secondary | ICD-10-CM | POA: Insufficient documentation

## 2016-04-27 DIAGNOSIS — J45909 Unspecified asthma, uncomplicated: Secondary | ICD-10-CM | POA: Insufficient documentation

## 2016-04-27 LAB — COMPREHENSIVE METABOLIC PANEL
ALT: 15 U/L (ref 14–54)
AST: 20 U/L (ref 15–41)
Albumin: 3.3 g/dL — ABNORMAL LOW (ref 3.5–5.0)
Alkaline Phosphatase: 95 U/L (ref 38–126)
Anion gap: 7 (ref 5–15)
BUN: 23 mg/dL — ABNORMAL HIGH (ref 6–20)
CO2: 25 mmol/L (ref 22–32)
Calcium: 9.3 mg/dL (ref 8.9–10.3)
Chloride: 108 mmol/L (ref 101–111)
Creatinine, Ser: 1.13 mg/dL — ABNORMAL HIGH (ref 0.44–1.00)
GFR calc Af Amer: 52 mL/min — ABNORMAL LOW (ref >60)
GFR calc non Af Amer: 45 mL/min — ABNORMAL LOW (ref >60)
Glucose, Bld: 120 mg/dL — ABNORMAL HIGH (ref 65–99)
Potassium: 4 mmol/L (ref 3.5–5.1)
Sodium: 140 mmol/L (ref 135–145)
Total Bilirubin: 0.4 mg/dL (ref 0.3–1.2)
Total Protein: 7.2 g/dL (ref 6.5–8.1)

## 2016-04-27 LAB — CBC WITH DIFFERENTIAL/PLATELET
Basophils Absolute: 0.1 10*3/uL (ref 0–0.1)
Basophils Relative: 1 %
Eosinophils Absolute: 0.1 10*3/uL (ref 0–0.7)
Eosinophils Relative: 2 %
HCT: 29.6 % — ABNORMAL LOW (ref 35.0–47.0)
Hemoglobin: 9.7 g/dL — ABNORMAL LOW (ref 12.0–16.0)
Lymphocytes Relative: 40 %
Lymphs Abs: 3 10*3/uL (ref 1.0–3.6)
MCH: 25.2 pg — ABNORMAL LOW (ref 26.0–34.0)
MCHC: 32.6 g/dL (ref 32.0–36.0)
MCV: 77.4 fL — ABNORMAL LOW (ref 80.0–100.0)
Monocytes Absolute: 0.7 10*3/uL (ref 0.2–0.9)
Monocytes Relative: 9 %
Neutro Abs: 3.5 10*3/uL (ref 1.4–6.5)
Neutrophils Relative %: 48 %
Platelets: 287 10*3/uL (ref 150–440)
RBC: 3.83 MIL/uL (ref 3.80–5.20)
RDW: 18.6 % — ABNORMAL HIGH (ref 11.5–14.5)
WBC: 7.4 10*3/uL (ref 3.6–11.0)

## 2016-04-27 LAB — SEDIMENTATION RATE: Sed Rate: 68 mm/hr — ABNORMAL HIGH (ref 0–30)

## 2016-04-27 LAB — FERRITIN: Ferritin: 37 ng/mL (ref 11–307)

## 2016-04-28 LAB — CANCER ANTIGEN 27.29: CA 27.29: 29.3 U/mL (ref 0.0–38.6)

## 2016-04-29 ENCOUNTER — Other Ambulatory Visit: Payer: Self-pay | Admitting: Hematology and Oncology

## 2016-04-29 NOTE — Progress Notes (Signed)
[facility] day:  04/30/2016  Chief Complaint: Jennifer Carrillo is a 83 y.o. female with stage IA left breast cancer and iron deficiency anemia who is seen for reassessment after IV iron.  HPI: The patient was last seen in the hematology clinic on 01/30/2016.  At that time, she felt good.  Ice pica had resolved.  CBC revealed a hematocrit of 33.5, hemoglobin 10.6, and MCV 69.8.   Ferritin was 78.  Iron studies revealed a saturation of 12% and a TIBC on 296.  She received Venofer 200 mg IV.   Regarding her breast cancer, at last visit she was healing well from left partial mastectomy on 12/20/2015.  She was on Femara.  A bone density study was ordered.  Bone density study at [facility] on 03/19/2016 was normal with a T score of 0.7 in L1-L4 and 0 in the left femur.  Labs on 03/12/2016 revealed a hematocrit of 33.1, hemoglobin 10.9, and MCV 73.4.  Ferritin was 93.  Sed rate was 86.   Labs on 04/27/2016 revealed a hematocrit 29.6, hemoglobin 9.7, MCV 77.4, platelets 287,000, white count 7400 with an ANC of 3500.  Ferritin was 37.  Sed rate was 68. Creatinine was 1.13.  CA27.29 was 29.3.  Symptomatically, she notes pain once in a while in her left breast. She is sleeping in her bra. She wears a sports bra.  She is eating "okay".  Some days, she is not "not".  Her brother died last week of an aneurysm. He was 81 years old. The funeral was over the weekend.  She has been stressed.  Blood pressure is elevated today.   Past Medical History:  Diagnosis Date  . Arthritis   . Asthma   . Calf pain   . COPD (chronic obstructive pulmonary disease) ([place])   . Diabetes ([place])   . Discoid lupus   . Gastro-esophageal reflux   . Glaucoma   . Hyperlipemia   . Hypertension   . Iron deficiency anemia   . Joint pain   . Leg swelling   . Osteoarthritis   . PVD (peripheral vascular disease) ([place])   . Sinus problem   . Sleep apnea   . Stroke [facility])     Past Surgical  History:  Procedure Laterality Date  . BREAST SURGERY    . CAROTID ENDARTERECTOMY Right   . CATARACT EXTRACTION Right 2013  . CORONARY STENT PLACEMENT     L. L. E.  . TUBAL LIGATION      Family History  Problem Relation Age of Onset  . Heart disease Mother   . Diabetes Other   . Hypertension Other   . Diabetes Sister   . Lung cancer Brother   . Leukemia Daughter   . Bladder Cancer Neg Hx   . Kidney disease Neg Hx   . Prostate cancer Neg Hx     Social History:  reports that she has quit smoking. She has never used smokeless tobacco. She reports that she does not drink alcohol or use drugs.  She lives in [place].  She lost her brother at age 20 last week to an aneurysm.  The patient is  alone today.  Allergies:  Allergies  Allergen Reactions  . Benazepril Other (See Comments)  . Statins     Current Medications: Current Outpatient Prescriptions  Medication Sig Dispense Refill  . amLODipine (NORVASC) 10 MG tablet Take 10 mg by mouth daily.    Marland Kitchen aspirin  81 MG tablet Take 81 mg by mouth daily.    . carvedilol (COREG) 25 MG tablet Take 25 mg by mouth 2 (two) times daily with a meal.    . digoxin (LANOXIN) 0.125 MG tablet Take by mouth daily.    Marland Kitchen docusate sodium (COLACE) 100 MG capsule Take 100 mg by mouth daily as needed for mild constipation.    . ergocalciferol (DRISDOL) 50000 UNITS capsule Take 50,000 Units by mouth once a week.    . ferrous sulfate 325 (65 FE) MG tablet Take 325 mg by mouth daily.    . insulin aspart protamine- aspart (NOVOLOG MIX 70/30) (70-30) 100 UNIT/ML injection Inject 35 Units into the skin 2 (two) times daily with a meal.    . letrozole (FEMARA) 2.5 MG tablet Take 2.5 mg by mouth daily.    Marland Kitchen lisinopril (PRINIVIL,ZESTRIL) 40 MG tablet Take 40 mg by mouth daily.    Marland Kitchen losartan (COZAAR) 100 MG tablet Take 100 mg by mouth daily.    . metFORMIN (GLUCOPHAGE) 1000 MG tablet Take 1,000 mg by mouth 2 (two) times daily with a meal.    . pantoprazole  (PROTONIX) 40 MG tablet Take 40 mg by mouth daily.    Marland Kitchen rOPINIRole (REQUIP) 0.5 MG tablet Take 0.5 mg by mouth at bedtime.    . conjugated estrogens (PREMARIN) vaginal cream Place 1 Applicatorful vaginally daily. Apply 0.62m (pea-sized amount)  just inside the vaginal introitus with a finger-tip every night for two weeks and then Monday, Wednesday and Friday nights. (Patient not taking: Reported on 01/30/2016) 30 g 12  . estradiol (ESTRACE VAGINAL) 0.1 MG/GM vaginal cream Apply 0.575m(pea-sized amount)  just inside the vaginal introitus with a finger-tip every night for two weeks and then Monday, Wednesday and Friday nights. (Patient not taking: Reported on 01/30/2016) 30 g 12  . ezetimibe (ZETIA) 10 MG tablet Take 10 mg by mouth daily.    . hydrochlorothiazide (HYDRODIURIL) 25 MG tablet Take by mouth.    . Marland KitchenROAIR HFA 108 (90 Base) MCG/ACT inhaler     . spironolactone (ALDACTONE) 100 MG tablet Take 150 mg by mouth daily.     No current facility-administered medications for this visit.     Review of Systems:  GENERAL:  Feels "ok".  No fevers, sweats or weight loss. PERFORMANCE STATUS (ECOG):  1 HEENT:  No visual changes, runny nose, sore throat, mouth sores or tenderness. Lungs: No shortness of breath or cough.  No hemoptysis. Cardiac:  No chest pain, palpitations, orthopnea, or PND. Breasts:  Once in awhile, pain in left breast. GI:  Eating fluctuates.  No nausea, vomiting, diarrhea, constipation, melena or hematochezia. Ice pica resolved. GU:  No urgency, frequency, dysuria, or hematuria. Musculoskeletal:  No back pain.  No joint pain.  No muscle tenderness. Extremities:  No pain or swelling. Skin:  No rashes or skin changes. Neuro:  No headache, numbness or weakness, balance or coordination issues. Endocrine:  Diabetes.  No thyroid issues, hot flashes or night sweats. Psych:  No mood changes, depression or anxiety.  Stress. Pain:  No focal pain. Review of systems:  All other systems  reviewed and found to be negative.  Physical Exam: Blood pressure (!) 178/82, pulse 69, temperature 97.4 F (36.3 C), temperature source Tympanic, resp. rate 18, weight 213 lb (96.6 kg). GENERAL:  Well developed, well nourished, woman sitting comfortably in the exam room in no acute distress. MENTAL STATUS:  Alert and oriented to person, place and time. HEAD:  Styled brown wig.  Normocephalic, atraumatic, face symmetric, no Cushingoid features. EYES:  Brown eyes.  Pupils equal round and reactive to light and accomodation.  No conjunctivitis or scleral icterus. ENT:  Oropharynx clear without lesion.  Tongue normal. Mucous membranes moist.  RESPIRATORY:  Clear to auscultation without rales, wheezes or rhonchi. CARDIOVASCULAR:  Regular rate and rhythm without murmur, rub or gallop. BREAST:  Right breast with mild fibrocystic changes.  No masses, skin changes or nipple discharge.  Left breast with post-operative edema at 6:30-7:00 position. No masses, skin changes or nipple discharge.  ABDOMEN:  Soft, non-tender, with active bowel sounds, and no appreciable hepatosplenomegaly.  No masses. SKIN:  No rashes, ulcers or lesions. EXTREMITIES: No edema, no skin discoloration or tenderness.  No palpable cords. LYMPH NODES: No palpable cervical, supraclavicular, axillary or inguinal adenopathy  NEUROLOGICAL: Unremarkable. PSYCH:  Appropriate.   No visits with results within 3 Day(s) from this visit.  Latest known visit with results is:  Appointment on 04/27/2016  Component Date Value Ref Range Status  . WBC 04/27/2016 7.4  3.6 - 11.0 K/uL Final  . RBC 04/27/2016 3.83  3.80 - 5.20 MIL/uL Final  . Hemoglobin 04/27/2016 9.7* 12.0 - 16.0 g/dL Final  . HCT 04/27/2016 29.6* 35.0 - 47.0 % Final  . MCV 04/27/2016 77.4* 80.0 - 100.0 fL Final  . MCH 04/27/2016 25.2* 26.0 - 34.0 pg Final  . MCHC 04/27/2016 32.6  32.0 - 36.0 g/dL Final  . RDW 04/27/2016 18.6* 11.5 - 14.5 % Final  . Platelets 04/27/2016 287   150 - 440 K/uL Final  . Neutrophils Relative % 04/27/2016 48  % Final  . Neutro Abs 04/27/2016 3.5  1.4 - 6.5 K/uL Final  . Lymphocytes Relative 04/27/2016 40  % Final  . Lymphs Abs 04/27/2016 3.0  1.0 - 3.6 K/uL Final  . Monocytes Relative 04/27/2016 9  % Final  . Monocytes Absolute 04/27/2016 0.7  0.2 - 0.9 K/uL Final  . Eosinophils Relative 04/27/2016 2  % Final  . Eosinophils Absolute 04/27/2016 0.1  0 - 0.7 K/uL Final  . Basophils Relative 04/27/2016 1  % Final  . Basophils Absolute 04/27/2016 0.1  0 - 0.1 K/uL Final  . Ferritin 04/27/2016 37  11 - 307 ng/mL Final  . Sed Rate 04/27/2016 68* 0 - 30 mm/hr Final  . Sodium 04/27/2016 140  135 - 145 mmol/L Final  . Potassium 04/27/2016 4.0  3.5 - 5.1 mmol/L Final  . Chloride 04/27/2016 108  101 - 111 mmol/L Final  . CO2 04/27/2016 25  22 - 32 mmol/L Final  . Glucose, Bld 04/27/2016 120* 65 - 99 mg/dL Final  . BUN 04/27/2016 23* 6 - 20 mg/dL Final  . Creatinine, Ser 04/27/2016 1.13* 0.44 - 1.00 mg/dL Final  . Calcium 04/27/2016 9.3  8.9 - 10.3 mg/dL Final  . Total Protein 04/27/2016 7.2  6.5 - 8.1 g/dL Final  . Albumin 04/27/2016 3.3* 3.5 - 5.0 g/dL Final  . AST 04/27/2016 20  15 - 41 U/L Final  . ALT 04/27/2016 15  14 - 54 U/L Final  . Alkaline Phosphatase 04/27/2016 95  38 - 126 U/L Final  . Total Bilirubin 04/27/2016 0.4  0.3 - 1.2 mg/dL Final  . GFR calc non Af Amer 04/27/2016 45* >60 mL/min Final  . GFR calc Af Amer 04/27/2016 52* >60 mL/min Final   Comment: (NOTE) The eGFR has been calculated using the CKD EPI equation. This calculation has not been validated in  all clinical situations. eGFR's persistently <60 mL/min signify possible Chronic Kidney Disease.   . Anion gap 04/27/2016 7  5 - 15 Final  . CA 27.29 04/27/2016 29.3  0.0 - 38.6 U/mL Final   Comment: (NOTE) [employer]ayer Cent[employer]/ACS methodology Performed At: [facility] [address] [place], [place] [id number] Lindon Romp MD OZ:[id number]      Assessment:  Jennifer Carrillo is an 83 y.o. female with stage IA left breast cancer and iron deficiency anemia.   She underwent left partial mastectomy at [facility] on 12/20/2015.  Pathology revealed a 1.4 cm invasive carcinoma with associated ductal carcinoma in situ. Surgical margins were negative.   Tumor was ER+(100%), PR+ (1-2%) , and Her2/neu 2+ (FISH -).  Pathologic stage was T1cNx.  Given her age, she was not felt to  benefit from radiation or chemotherapy.  She began Femara on 12/27/2015.  CA27.29 has been followed: 29.3 on 04/27/2016.  Bone density study at [facility] on 03/19/2016 was normal with a T score of 0.7 in L1-L4 and 0 in the left femur.  She has had iron defciency for 15 years.  She has been off/on oral iron (last 3 1/2 years ago).  Diet appears good.  She denies any melena, hematochezia, hematuria, or vaginal bleeding.  She has had ice pica for 7-8 years.  EGD in 2015 which was "okay", per patient report.  Colonoscopy on 06/23/2013 revealed a single nonbleeding angioectasia. There were internal and external hemorrhoids.  Labs on 11/01/2015 revealed the following: hematocrit 29.5, hemoglobin 8.9, MCV 70, platelets 379,000, white count 8400 with an ANC of 4500.  Creatinine was 1.3 (CrCl 39-45 ml/min).  B12 and folate were normal.  Ferritin was 39.  She received 400 mg Venofer (12/30/2015 - 01/06/2016) and 200 mg (01/30/2016).    Ferritin has been followed: 19 on 12/08/2015, 36 on 12/29/2015, 110 on 01/06/2016, 104 on 01/20/2016, 78 on 01/30/2016, 93 on 03/12/2016 and 37 on 04/27/2016.  ESR is elevated (69-100) which will falsely increase ferritin.  Symptomatically, she has intermittent left breast pain.  Exam is stable.  Hematocrit is 29.6.  MCV is 77.4.  Plan: 1.  Labs today: CBC with diff, CMP, ferritin, sed rate. 2.  Discuss bone density study- normal.  Continue calcium and vitamin D. 3.  Discuss drop in hematocrit as well as iron stores.  Ferritin may be falsely elevated  secondary to sed rate.  Discuss continuation of IV iron.  Discuss follow-up with GI. 4.  Venofer today 5.  Schedule f/u with GI (previously saw Dr Rayann Heman) for recurrent iron deficiency anemia. 6.  RTC in 1 week for Venofer 7.  RTC in 5 weeks for MD assess, labs (CBC with diff, ferritin, iron studies, ESR- day before) +/- Venofer   Lequita Asal, MD  04/30/2016

## 2016-04-30 ENCOUNTER — Inpatient Hospital Stay: Payer: Medicare HMO

## 2016-04-30 ENCOUNTER — Other Ambulatory Visit: Payer: Medicare HMO

## 2016-04-30 ENCOUNTER — Inpatient Hospital Stay (HOSPITAL_BASED_OUTPATIENT_CLINIC_OR_DEPARTMENT_OTHER): Payer: Medicare HMO | Admitting: Hematology and Oncology

## 2016-04-30 ENCOUNTER — Other Ambulatory Visit: Payer: Self-pay | Admitting: Hematology and Oncology

## 2016-04-30 ENCOUNTER — Encounter: Payer: Self-pay | Admitting: Hematology and Oncology

## 2016-04-30 VITALS — BP 178/82 | HR 69 | Temp 97.4°F | Resp 18 | Wt 213.0 lb

## 2016-04-30 DIAGNOSIS — K648 Other hemorrhoids: Secondary | ICD-10-CM | POA: Diagnosis not present

## 2016-04-30 DIAGNOSIS — K219 Gastro-esophageal reflux disease without esophagitis: Secondary | ICD-10-CM

## 2016-04-30 DIAGNOSIS — J449 Chronic obstructive pulmonary disease, unspecified: Secondary | ICD-10-CM

## 2016-04-30 DIAGNOSIS — Z79811 Long term (current) use of aromatase inhibitors: Secondary | ICD-10-CM | POA: Diagnosis not present

## 2016-04-30 DIAGNOSIS — C50912 Malignant neoplasm of unspecified site of left female breast: Secondary | ICD-10-CM

## 2016-04-30 DIAGNOSIS — D509 Iron deficiency anemia, unspecified: Secondary | ICD-10-CM | POA: Diagnosis not present

## 2016-04-30 DIAGNOSIS — Z79899 Other long term (current) drug therapy: Secondary | ICD-10-CM

## 2016-04-30 DIAGNOSIS — N644 Mastodynia: Secondary | ICD-10-CM | POA: Diagnosis not present

## 2016-04-30 DIAGNOSIS — J45909 Unspecified asthma, uncomplicated: Secondary | ICD-10-CM

## 2016-04-30 DIAGNOSIS — Z87891 Personal history of nicotine dependence: Secondary | ICD-10-CM

## 2016-04-30 DIAGNOSIS — M1990 Unspecified osteoarthritis, unspecified site: Secondary | ICD-10-CM

## 2016-04-30 DIAGNOSIS — M129 Arthropathy, unspecified: Secondary | ICD-10-CM

## 2016-04-30 DIAGNOSIS — Z170 Estrogen receptor positive status [ER+]: Secondary | ICD-10-CM | POA: Diagnosis not present

## 2016-04-30 DIAGNOSIS — L930 Discoid lupus erythematosus: Secondary | ICD-10-CM | POA: Diagnosis not present

## 2016-04-30 DIAGNOSIS — E785 Hyperlipidemia, unspecified: Secondary | ICD-10-CM

## 2016-04-30 DIAGNOSIS — Z7982 Long term (current) use of aspirin: Secondary | ICD-10-CM

## 2016-04-30 DIAGNOSIS — Z7984 Long term (current) use of oral hypoglycemic drugs: Secondary | ICD-10-CM

## 2016-04-30 DIAGNOSIS — G4730 Sleep apnea, unspecified: Secondary | ICD-10-CM

## 2016-04-30 DIAGNOSIS — Z809 Family history of malignant neoplasm, unspecified: Secondary | ICD-10-CM

## 2016-04-30 DIAGNOSIS — Z9012 Acquired absence of left breast and nipple: Secondary | ICD-10-CM

## 2016-04-30 DIAGNOSIS — I10 Essential (primary) hypertension: Secondary | ICD-10-CM

## 2016-04-30 DIAGNOSIS — E1151 Type 2 diabetes mellitus with diabetic peripheral angiopathy without gangrene: Secondary | ICD-10-CM

## 2016-04-30 DIAGNOSIS — Z8673 Personal history of transient ischemic attack (TIA), and cerebral infarction without residual deficits: Secondary | ICD-10-CM

## 2016-04-30 MED ORDER — SODIUM CHLORIDE 0.9 % IV SOLN: 200.0000 mg | Freq: Once | INTRAVENOUS | Status: DC

## 2016-04-30 MED ORDER — IRON SUCROSE 20 MG/ML IV SOLN
200.0000 mg | Freq: Once | INTRAVENOUS | Status: AC
  Administered 2016-04-30: 200 mg via INTRAVENOUS
  Filled 2016-04-30: qty 10

## 2016-04-30 MED ORDER — SODIUM CHLORIDE 0.9 % IV SOLN
Freq: Once | INTRAVENOUS | Status: AC
  Administered 2016-04-30: 15:00:00 via INTRAVENOUS
  Filled 2016-04-30: qty 1000

## 2016-04-30 NOTE — Progress Notes (Signed)
Patient states she is short of breath walking only short distances.  Otherwise no complaints.  BP elevated today.  Patient states she lost her last brother last week and his funeral was over the weekend.  She has been stressed.

## 2016-05-06 ENCOUNTER — Other Ambulatory Visit: Payer: Self-pay | Admitting: Hematology and Oncology

## 2016-05-07 ENCOUNTER — Inpatient Hospital Stay: Payer: Medicare HMO

## 2016-06-06 ENCOUNTER — Inpatient Hospital Stay: Payer: Medicare HMO | Attending: Hematology and Oncology

## 2016-06-06 DIAGNOSIS — Z7982 Long term (current) use of aspirin: Secondary | ICD-10-CM | POA: Diagnosis not present

## 2016-06-06 DIAGNOSIS — J449 Chronic obstructive pulmonary disease, unspecified: Secondary | ICD-10-CM | POA: Insufficient documentation

## 2016-06-06 DIAGNOSIS — Z8673 Personal history of transient ischemic attack (TIA), and cerebral infarction without residual deficits: Secondary | ICD-10-CM | POA: Diagnosis not present

## 2016-06-06 DIAGNOSIS — I10 Essential (primary) hypertension: Secondary | ICD-10-CM | POA: Diagnosis not present

## 2016-06-06 DIAGNOSIS — K219 Gastro-esophageal reflux disease without esophagitis: Secondary | ICD-10-CM | POA: Insufficient documentation

## 2016-06-06 DIAGNOSIS — D509 Iron deficiency anemia, unspecified: Secondary | ICD-10-CM | POA: Insufficient documentation

## 2016-06-06 DIAGNOSIS — Z7984 Long term (current) use of oral hypoglycemic drugs: Secondary | ICD-10-CM | POA: Insufficient documentation

## 2016-06-06 DIAGNOSIS — M1712 Unilateral primary osteoarthritis, left knee: Secondary | ICD-10-CM | POA: Diagnosis not present

## 2016-06-06 DIAGNOSIS — E785 Hyperlipidemia, unspecified: Secondary | ICD-10-CM | POA: Diagnosis not present

## 2016-06-06 DIAGNOSIS — Z79899 Other long term (current) drug therapy: Secondary | ICD-10-CM | POA: Diagnosis not present

## 2016-06-06 DIAGNOSIS — Z79811 Long term (current) use of aromatase inhibitors: Secondary | ICD-10-CM | POA: Diagnosis not present

## 2016-06-06 DIAGNOSIS — Z170 Estrogen receptor positive status [ER+]: Secondary | ICD-10-CM | POA: Diagnosis not present

## 2016-06-06 DIAGNOSIS — G4730 Sleep apnea, unspecified: Secondary | ICD-10-CM | POA: Diagnosis not present

## 2016-06-06 DIAGNOSIS — E1151 Type 2 diabetes mellitus with diabetic peripheral angiopathy without gangrene: Secondary | ICD-10-CM | POA: Diagnosis not present

## 2016-06-06 DIAGNOSIS — N644 Mastodynia: Secondary | ICD-10-CM | POA: Insufficient documentation

## 2016-06-06 DIAGNOSIS — C50912 Malignant neoplasm of unspecified site of left female breast: Secondary | ICD-10-CM | POA: Diagnosis not present

## 2016-06-06 LAB — CBC WITH DIFFERENTIAL/PLATELET
Basophils Absolute: 0.1 10*3/uL (ref 0–0.1)
Basophils Relative: 1 %
Eosinophils Absolute: 0.2 10*3/uL (ref 0–0.7)
Eosinophils Relative: 2 %
HCT: 31.3 % — ABNORMAL LOW (ref 35.0–47.0)
Hemoglobin: 10.2 g/dL — ABNORMAL LOW (ref 12.0–16.0)
Lymphocytes Relative: 40 %
Lymphs Abs: 3.3 10*3/uL (ref 1.0–3.6)
MCH: 24.7 pg — ABNORMAL LOW (ref 26.0–34.0)
MCHC: 32.7 g/dL (ref 32.0–36.0)
MCV: 75.6 fL — ABNORMAL LOW (ref 80.0–100.0)
Monocytes Absolute: 0.7 10*3/uL (ref 0.2–0.9)
Monocytes Relative: 8 %
Neutro Abs: 4.1 10*3/uL (ref 1.4–6.5)
Neutrophils Relative %: 49 %
Platelets: 284 10*3/uL (ref 150–440)
RBC: 4.14 MIL/uL (ref 3.80–5.20)
RDW: 17.8 % — ABNORMAL HIGH (ref 11.5–14.5)
WBC: 8.3 10*3/uL (ref 3.6–11.0)

## 2016-06-06 LAB — SEDIMENTATION RATE: Sed Rate: 96 mm/hr — ABNORMAL HIGH (ref 0–30)

## 2016-06-06 LAB — FERRITIN: Ferritin: 32 ng/mL (ref 11–307)

## 2016-06-06 LAB — IRON AND TIBC
Iron: 42 ug/dL (ref 28–170)
Saturation Ratios: 13 % (ref 10.4–31.8)
TIBC: 324 ug/dL (ref 250–450)
UIBC: 282 ug/dL

## 2016-06-07 ENCOUNTER — Inpatient Hospital Stay: Payer: Medicare HMO

## 2016-06-07 ENCOUNTER — Encounter: Payer: Self-pay | Admitting: Hematology and Oncology

## 2016-06-07 ENCOUNTER — Inpatient Hospital Stay (HOSPITAL_BASED_OUTPATIENT_CLINIC_OR_DEPARTMENT_OTHER): Payer: Medicare HMO | Admitting: Hematology and Oncology

## 2016-06-07 VITALS — BP 157/75 | HR 78 | Temp 97.9°F | Resp 18 | Wt 209.4 lb

## 2016-06-07 DIAGNOSIS — Z170 Estrogen receptor positive status [ER+]: Secondary | ICD-10-CM | POA: Diagnosis not present

## 2016-06-07 DIAGNOSIS — G4730 Sleep apnea, unspecified: Secondary | ICD-10-CM

## 2016-06-07 DIAGNOSIS — C50912 Malignant neoplasm of unspecified site of left female breast: Secondary | ICD-10-CM

## 2016-06-07 DIAGNOSIS — K219 Gastro-esophageal reflux disease without esophagitis: Secondary | ICD-10-CM

## 2016-06-07 DIAGNOSIS — I10 Essential (primary) hypertension: Secondary | ICD-10-CM | POA: Diagnosis not present

## 2016-06-07 DIAGNOSIS — M1712 Unilateral primary osteoarthritis, left knee: Secondary | ICD-10-CM | POA: Diagnosis not present

## 2016-06-07 DIAGNOSIS — Z79811 Long term (current) use of aromatase inhibitors: Secondary | ICD-10-CM

## 2016-06-07 DIAGNOSIS — E785 Hyperlipidemia, unspecified: Secondary | ICD-10-CM

## 2016-06-07 DIAGNOSIS — D509 Iron deficiency anemia, unspecified: Secondary | ICD-10-CM

## 2016-06-07 DIAGNOSIS — J449 Chronic obstructive pulmonary disease, unspecified: Secondary | ICD-10-CM | POA: Diagnosis not present

## 2016-06-07 DIAGNOSIS — N644 Mastodynia: Secondary | ICD-10-CM | POA: Diagnosis not present

## 2016-06-07 DIAGNOSIS — E1151 Type 2 diabetes mellitus with diabetic peripheral angiopathy without gangrene: Secondary | ICD-10-CM

## 2016-06-07 DIAGNOSIS — Z8673 Personal history of transient ischemic attack (TIA), and cerebral infarction without residual deficits: Secondary | ICD-10-CM

## 2016-06-07 LAB — URINALYSIS, COMPLETE (UACMP) WITH MICROSCOPIC
Bacteria, UA: NONE SEEN
Bilirubin Urine: NEGATIVE
Glucose, UA: NEGATIVE mg/dL
Hgb urine dipstick: NEGATIVE
Ketones, ur: NEGATIVE mg/dL
Leukocytes, UA: NEGATIVE
Nitrite: NEGATIVE
Protein, ur: 100 mg/dL — AB
Specific Gravity, Urine: 1.018 (ref 1.005–1.030)
pH: 5 (ref 5.0–8.0)

## 2016-06-07 MED ORDER — IRON SUCROSE 20 MG/ML IV SOLN
200.0000 mg | Freq: Once | INTRAVENOUS | Status: AC
  Administered 2016-06-07: 200 mg via INTRAVENOUS
  Filled 2016-06-07: qty 10

## 2016-06-07 MED ORDER — SODIUM CHLORIDE 0.9 % IV SOLN: 200.0000 mg | Freq: Once | INTRAVENOUS | Status: DC

## 2016-06-07 MED ORDER — SODIUM CHLORIDE 0.9 % IV SOLN
Freq: Once | INTRAVENOUS | Status: AC
  Administered 2016-06-07: 15:00:00 via INTRAVENOUS
  Filled 2016-06-07: qty 1000

## 2016-06-07 NOTE — Progress Notes (Signed)
[facility] day:  06/07/2016  Chief Complaint: Jennifer Carrillo is a 83 y.o. female with stage IA left breast cancer and iron deficiency anemia who is seen for 1 month assessment.  HPI: The patient was last seen in the hematology clinic on 04/30/2016.  At that time, she described intermittent left breast pain.  Exam was stable.  Hematocrit was 29.6.  MCV was 77.4.  Ferritin was 37 with a sed rate of 68.  She received Venofer on 04/30/2016.  She was scheduled to follow-up with GI.  Labs from yesterday included a hematocrit 31.3, hemoglobin 10.2, MCV 75.6, platelets 284,000, white count 8300 with a normal differential.  Ferritin was 32. Iron studies included a saturation of 13% a TIBC of 324.  Sedimentation rate was 96.  Symptomatically, she is "doing better".  She feels less tired.  She is limping because of her left knee (arthritis).  She has not followed up with GI.   Past Medical History:  Diagnosis Date  . Arthritis   . Asthma   . Calf pain   . COPD (chronic obstructive pulmonary disease) ([place])   . Diabetes ([place])   . Discoid lupus   . Gastro-esophageal reflux   . Glaucoma   . Hyperlipemia   . Hypertension   . Iron deficiency anemia   . Joint pain   . Leg swelling   . Osteoarthritis   . PVD (peripheral vascular disease) ([place])   . Sinus problem   . Sleep apnea   . Stroke [facility])     Past Surgical History:  Procedure Laterality Date  . BREAST SURGERY    . CAROTID ENDARTERECTOMY Right   . CATARACT EXTRACTION Right 2013  . CORONARY STENT PLACEMENT     L. L. E.  . TUBAL LIGATION      Family History  Problem Relation Age of Onset  . Heart disease Mother   . Diabetes Other   . Hypertension Other   . Diabetes Sister   . Lung cancer Brother   . Leukemia Daughter   . Bladder Cancer Neg Hx   . Kidney disease Neg Hx   . Prostate cancer Neg Hx     Social History:  reports that she has quit smoking. She has never used smokeless  tobacco. She reports that she does not drink alcohol or use drugs.  She lives in [place].  She lost her brother at age 59 last week to an aneurysm.  The patient is  alone today.  Allergies:  Allergies  Allergen Reactions  . Benazepril Other (See Comments)  . Statins     Current Medications: Current Outpatient Prescriptions  Medication Sig Dispense Refill  . amLODipine (NORVASC) 10 MG tablet Take 10 mg by mouth daily.    Marland Kitchen aspirin 81 MG tablet Take 81 mg by mouth daily.    . digoxin (LANOXIN) 0.125 MG tablet Take by mouth daily.    Marland Kitchen docusate sodium (COLACE) 100 MG capsule Take 100 mg by mouth daily as needed for mild constipation.    . ergocalciferol (DRISDOL) 50000 UNITS capsule Take 50,000 Units by mouth once a week.    . ezetimibe (ZETIA) 10 MG tablet Take 10 mg by mouth daily.    . ferrous sulfate 325 (65 FE) MG tablet Take 325 mg by mouth daily.    . hydrochlorothiazide (HYDRODIURIL) 25 MG tablet Take by mouth.    . insulin aspart protamine- aspart (NOVOLOG MIX 70/30) (70-30) 100 UNIT/ML  injection Inject 35 Units into the skin 2 (two) times daily with a meal.    . letrozole (FEMARA) 2.5 MG tablet Take 2.5 mg by mouth daily.    Marland Kitchen lisinopril (PRINIVIL,ZESTRIL) 40 MG tablet Take 40 mg by mouth daily.    Marland Kitchen losartan (COZAAR) 100 MG tablet Take 100 mg by mouth daily.    . metFORMIN (GLUCOPHAGE) 1000 MG tablet Take 1,000 mg by mouth 2 (two) times daily with a meal.    . pantoprazole (PROTONIX) 40 MG tablet Take 40 mg by mouth daily.    Marland Kitchen PROAIR HFA 108 (90 Base) MCG/ACT inhaler     . rOPINIRole (REQUIP) 0.5 MG tablet Take 0.5 mg by mouth at bedtime.     No current facility-administered medications for this visit.     Review of Systems:  GENERAL:  Feels "better".  No fevers or sweats.  Weight loss of 4 pounds. PERFORMANCE STATUS (ECOG):  1 HEENT:  No visual changes, runny nose, sore throat, mouth sores or tenderness. Lungs: No shortness of breath or cough.  No  hemoptysis. Cardiac:  No chest pain, palpitations, orthopnea, or PND. GI:  No nausea, vomiting, diarrhea, constipation, melena or hematochezia. Ice pica resolved. GU:  No urgency, frequency, dysuria, or hematuria. Musculoskeletal:  No back pain.  Left knee arthritis.  No muscle tenderness. Extremities:  No pain or swelling. Skin:  No rashes or skin changes. Neuro:  No headache, numbness or weakness, balance or coordination issues. Endocrine:  Diabetes.  No thyroid issues, hot flashes or night sweats. Psych:  No mood changes, depression or anxiety.  Stress. Pain:  No focal pain. Review of systems:  All other systems reviewed and found to be negative.  Physical Exam: Blood pressure (!) 157/75, pulse 78, temperature 97.9 F (36.6 C), temperature source Tympanic, resp. rate 18, weight 209 lb 7 oz (95 kg). GENERAL:  Well developed, well nourished, woman sitting comfortably in the exam room in no acute distress. MENTAL STATUS:  Alert and oriented to person, place and time. HEAD:  Wearing a black cap.  White curly short hair.  Normocephalic, atraumatic, face symmetric, no Cushingoid features. EYES:  Brown eyes.  Pupils equal round and reactive to light and accomodation.  No conjunctivitis or scleral icterus. ENT:  Oropharynx clear without lesion.  Tongue normal. Mucous membranes moist.  RESPIRATORY:  Clear to auscultation without rales, wheezes or rhonchi. CARDIOVASCULAR:  Regular rate and rhythm without murmur, rub or gallop. ABDOMEN:  Soft, non-tender, with active bowel sounds, and no appreciable hepatosplenomegaly.  No masses. SKIN:  No rashes, ulcers or lesions. EXTREMITIES: No edema, no skin discoloration or tenderness.  No palpable cords. LYMPH NODES: No palpable cervical, supraclavicular, axillary or inguinal adenopathy  NEUROLOGICAL: Unremarkable. PSYCH:  Appropriate.   Appointment on 06/06/2016  Component Date Value Ref Range Status  . WBC 06/06/2016 8.3  3.6 - 11.0 K/uL Final  .  RBC 06/06/2016 4.14  3.80 - 5.20 MIL/uL Final  . Hemoglobin 06/06/2016 10.2* 12.0 - 16.0 g/dL Final  . HCT 06/06/2016 31.3* 35.0 - 47.0 % Final  . MCV 06/06/2016 75.6* 80.0 - 100.0 fL Final  . MCH 06/06/2016 24.7* 26.0 - 34.0 pg Final  . MCHC 06/06/2016 32.7  32.0 - 36.0 g/dL Final  . RDW 06/06/2016 17.8* 11.5 - 14.5 % Final  . Platelets 06/06/2016 284  150 - 440 K/uL Final  . Neutrophils Relative % 06/06/2016 49  % Final  . Neutro Abs 06/06/2016 4.1  1.4 - 6.5 K/uL Final  .  Lymphocytes Relative 06/06/2016 40  % Final  . Lymphs Abs 06/06/2016 3.3  1.0 - 3.6 K/uL Final  . Monocytes Relative 06/06/2016 8  % Final  . Monocytes Absolute 06/06/2016 0.7  0.2 - 0.9 K/uL Final  . Eosinophils Relative 06/06/2016 2  % Final  . Eosinophils Absolute 06/06/2016 0.2  0 - 0.7 K/uL Final  . Basophils Relative 06/06/2016 1  % Final  . Basophils Absolute 06/06/2016 0.1  0 - 0.1 K/uL Final  . Ferritin 06/06/2016 32  11 - 307 ng/mL Final  . Iron 06/06/2016 42  28 - 170 ug/dL Final  . TIBC 06/06/2016 324  250 - 450 ug/dL Final  . Saturation Ratios 06/06/2016 13  10.4 - 31.8 % Final  . UIBC 06/06/2016 282  ug/dL Final  . Sed Rate 06/06/2016 96* 0 - 30 mm/hr Final    Assessment:  EMMERSYN KRATZKE is an 83 y.o. female with stage IA left breast cancer and iron deficiency anemia.   She underwent left partial mastectomy at [facility] on 12/20/2015.  Pathology revealed a 1.4 cm invasive carcinoma with associated ductal carcinoma in situ. Surgical margins were negative.   Tumor was ER+(100%), PR+ (1-2%) , and Her2/neu 2+ (FISH -).  Pathologic stage was T1cNx.  Given her age, she was not felt to  benefit from radiation or chemotherapy.  She began Femara on 12/27/2015.  CA27.29 has been followed: 29.3 on 04/27/2016.  Bone density study at [facility] on 03/19/2016 was normal with a T score of 0.7 in L1-L4 and 0 in the left femur.  She has had iron defciency for 15 years.  She has been off/on oral iron (last 3 1/2 years ago).   Diet appears good.  She denies any melena, hematochezia, hematuria, or vaginal bleeding.  She has had ice pica for 7-8 years.  EGD in 2015 which was "okay", per patient report.  Colonoscopy on 06/23/2013 revealed a single nonbleeding angioectasia. There were internal and external hemorrhoids.  Labs on 11/01/2015 revealed the following: hematocrit 29.5, hemoglobin 8.9, MCV 70, platelets 379,000, white count 8400 with an ANC of 4500.  Creatinine was 1.3 (CrCl 39-45 ml/min).  B12 and folate were normal.  Ferritin was 39.  She received 400 mg Venofer (12/30/2015 - 01/06/2016) and 200 mg (01/30/2016, 04/30/2016).    Ferritin has been followed: 19 on 12/08/2015, 36 on 12/29/2015, 110 on 01/06/2016, 104 on 01/20/2016, 78 on 01/30/2016, 93 on 03/12/2016, 37 on 04/27/2016, and 32 on 06/06/2016.  ESR is elevated (69-100) which will falsely increase ferritin.  Symptomatically, she has intermittent left breast pain.  Exam is stable.  Hematocrit is 31.3.  MCV is 75.6.  Plan: 1.  Review labs from yesterday.  RBC still microcytic.  Ferritin is low given elevated sedimentation rate.  Hemoglobin improved from 9.7 to 10.2 after last Venofer.  Discuss continuation of weekly Venofer.  Discuss follow-up with GI. 2.  Venofer today. 3.  Urinalysis: r/o hematuria. 4.  RTC weekly x 2 for Venofer 5.  Obtain copy of labs from [facility] 6.  Reschedule GI appt (patient missed) 7.  RTC in 6 weeks for MD assessment and labs (CBC with diff, ferritin, sed rate).   Lequita Asal, MD  06/07/2016, 2:06 PM

## 2016-06-07 NOTE — Progress Notes (Signed)
Patient here today for follow up regarding anemia.  States MD made an appointment for an endoscopy at her last visit.  She has just lost her brother and did not keep that appointment.  She states she has had endoscopy before.  She also states that she has hemorrhoids that are high up in the colon.

## 2016-06-14 ENCOUNTER — Other Ambulatory Visit: Payer: Self-pay | Admitting: Hematology and Oncology

## 2016-06-14 ENCOUNTER — Inpatient Hospital Stay: Payer: Medicare HMO

## 2016-06-14 ENCOUNTER — Inpatient Hospital Stay: Payer: Medicare HMO | Attending: Hematology and Oncology

## 2016-06-14 VITALS — BP 160/70 | HR 74 | Temp 97.0°F | Resp 18

## 2016-06-14 DIAGNOSIS — D509 Iron deficiency anemia, unspecified: Secondary | ICD-10-CM | POA: Insufficient documentation

## 2016-06-14 DIAGNOSIS — Z79899 Other long term (current) drug therapy: Secondary | ICD-10-CM | POA: Diagnosis not present

## 2016-06-14 MED ORDER — IRON SUCROSE 20 MG/ML IV SOLN
200.0000 mg | Freq: Once | INTRAVENOUS | Status: AC
  Administered 2016-06-14: 200 mg via INTRAVENOUS
  Filled 2016-06-14: qty 10

## 2016-06-14 MED ORDER — SODIUM CHLORIDE 0.9 % IV SOLN
Freq: Once | INTRAVENOUS | Status: AC
  Administered 2016-06-14: 14:00:00 via INTRAVENOUS
  Filled 2016-06-14: qty 1000

## 2016-06-14 MED ORDER — SODIUM CHLORIDE 0.9 % IV SOLN: 200.0000 mg | Freq: Once | INTRAVENOUS | Status: DC

## 2016-06-21 ENCOUNTER — Inpatient Hospital Stay: Payer: Medicare HMO

## 2016-06-21 VITALS — BP 150/73 | HR 71 | Temp 98.1°F | Resp 18

## 2016-06-21 DIAGNOSIS — Z79899 Other long term (current) drug therapy: Secondary | ICD-10-CM | POA: Diagnosis not present

## 2016-06-21 DIAGNOSIS — D509 Iron deficiency anemia, unspecified: Secondary | ICD-10-CM

## 2016-06-21 MED ORDER — SODIUM CHLORIDE 0.9 % IV SOLN: 200.0000 mg | Freq: Once | INTRAVENOUS | Status: DC

## 2016-06-21 MED ORDER — SODIUM CHLORIDE 0.9 % IV SOLN
Freq: Once | INTRAVENOUS | Status: AC
  Administered 2016-06-21: 14:00:00 via INTRAVENOUS
  Filled 2016-06-21: qty 1000

## 2016-06-21 MED ORDER — IRON SUCROSE 20 MG/ML IV SOLN
200.0000 mg | Freq: Once | INTRAVENOUS | Status: AC
  Administered 2016-06-21: 200 mg via INTRAVENOUS
  Filled 2016-06-21: qty 10

## 2016-06-26 DIAGNOSIS — D649 Anemia, unspecified: Secondary | ICD-10-CM | POA: Diagnosis not present

## 2016-06-26 DIAGNOSIS — I10 Essential (primary) hypertension: Secondary | ICD-10-CM | POA: Diagnosis not present

## 2016-06-26 DIAGNOSIS — M329 Systemic lupus erythematosus, unspecified: Secondary | ICD-10-CM | POA: Diagnosis not present

## 2016-06-26 DIAGNOSIS — Z87891 Personal history of nicotine dependence: Secondary | ICD-10-CM | POA: Diagnosis not present

## 2016-06-26 DIAGNOSIS — Z794 Long term (current) use of insulin: Secondary | ICD-10-CM | POA: Diagnosis not present

## 2016-06-26 DIAGNOSIS — C50912 Malignant neoplasm of unspecified site of left female breast: Secondary | ICD-10-CM | POA: Diagnosis not present

## 2016-06-26 DIAGNOSIS — M1990 Unspecified osteoarthritis, unspecified site: Secondary | ICD-10-CM | POA: Diagnosis not present

## 2016-06-26 DIAGNOSIS — Z170 Estrogen receptor positive status [ER+]: Secondary | ICD-10-CM | POA: Diagnosis not present

## 2016-06-26 DIAGNOSIS — E875 Hyperkalemia: Secondary | ICD-10-CM | POA: Diagnosis not present

## 2016-06-26 DIAGNOSIS — K219 Gastro-esophageal reflux disease without esophagitis: Secondary | ICD-10-CM | POA: Diagnosis not present

## 2016-06-26 DIAGNOSIS — C50919 Malignant neoplasm of unspecified site of unspecified female breast: Secondary | ICD-10-CM | POA: Diagnosis not present

## 2016-06-26 DIAGNOSIS — Z79899 Other long term (current) drug therapy: Secondary | ICD-10-CM | POA: Diagnosis not present

## 2016-06-26 DIAGNOSIS — E119 Type 2 diabetes mellitus without complications: Secondary | ICD-10-CM | POA: Diagnosis not present

## 2016-07-19 ENCOUNTER — Inpatient Hospital Stay (HOSPITAL_BASED_OUTPATIENT_CLINIC_OR_DEPARTMENT_OTHER): Payer: Medicare HMO | Admitting: Hematology and Oncology

## 2016-07-19 ENCOUNTER — Encounter: Payer: Self-pay | Admitting: Hematology and Oncology

## 2016-07-19 ENCOUNTER — Inpatient Hospital Stay: Payer: Medicare HMO | Attending: Hematology and Oncology

## 2016-07-19 VITALS — BP 188/70 | HR 77 | Temp 96.5°F | Resp 20 | Wt 209.5 lb

## 2016-07-19 DIAGNOSIS — K644 Residual hemorrhoidal skin tags: Secondary | ICD-10-CM

## 2016-07-19 DIAGNOSIS — I493 Ventricular premature depolarization: Secondary | ICD-10-CM | POA: Diagnosis not present

## 2016-07-19 DIAGNOSIS — C50912 Malignant neoplasm of unspecified site of left female breast: Secondary | ICD-10-CM | POA: Diagnosis not present

## 2016-07-19 DIAGNOSIS — Z7982 Long term (current) use of aspirin: Secondary | ICD-10-CM | POA: Insufficient documentation

## 2016-07-19 DIAGNOSIS — L930 Discoid lupus erythematosus: Secondary | ICD-10-CM | POA: Insufficient documentation

## 2016-07-19 DIAGNOSIS — Z79899 Other long term (current) drug therapy: Secondary | ICD-10-CM | POA: Diagnosis not present

## 2016-07-19 DIAGNOSIS — D509 Iron deficiency anemia, unspecified: Secondary | ICD-10-CM | POA: Diagnosis not present

## 2016-07-19 DIAGNOSIS — G4730 Sleep apnea, unspecified: Secondary | ICD-10-CM | POA: Insufficient documentation

## 2016-07-19 DIAGNOSIS — J449 Chronic obstructive pulmonary disease, unspecified: Secondary | ICD-10-CM | POA: Diagnosis not present

## 2016-07-19 DIAGNOSIS — K648 Other hemorrhoids: Secondary | ICD-10-CM

## 2016-07-19 DIAGNOSIS — Z170 Estrogen receptor positive status [ER+]: Secondary | ICD-10-CM | POA: Insufficient documentation

## 2016-07-19 DIAGNOSIS — M1990 Unspecified osteoarthritis, unspecified site: Secondary | ICD-10-CM | POA: Diagnosis not present

## 2016-07-19 DIAGNOSIS — E119 Type 2 diabetes mellitus without complications: Secondary | ICD-10-CM

## 2016-07-19 DIAGNOSIS — Z801 Family history of malignant neoplasm of trachea, bronchus and lung: Secondary | ICD-10-CM | POA: Insufficient documentation

## 2016-07-19 DIAGNOSIS — E785 Hyperlipidemia, unspecified: Secondary | ICD-10-CM | POA: Diagnosis not present

## 2016-07-19 DIAGNOSIS — K219 Gastro-esophageal reflux disease without esophagitis: Secondary | ICD-10-CM

## 2016-07-19 DIAGNOSIS — M129 Arthropathy, unspecified: Secondary | ICD-10-CM | POA: Diagnosis not present

## 2016-07-19 DIAGNOSIS — Z8673 Personal history of transient ischemic attack (TIA), and cerebral infarction without residual deficits: Secondary | ICD-10-CM | POA: Diagnosis not present

## 2016-07-19 DIAGNOSIS — J45909 Unspecified asthma, uncomplicated: Secondary | ICD-10-CM | POA: Insufficient documentation

## 2016-07-19 DIAGNOSIS — Z794 Long term (current) use of insulin: Secondary | ICD-10-CM

## 2016-07-19 DIAGNOSIS — Z79811 Long term (current) use of aromatase inhibitors: Secondary | ICD-10-CM | POA: Insufficient documentation

## 2016-07-19 DIAGNOSIS — Z9012 Acquired absence of left breast and nipple: Secondary | ICD-10-CM

## 2016-07-19 DIAGNOSIS — Z806 Family history of leukemia: Secondary | ICD-10-CM

## 2016-07-19 DIAGNOSIS — Z87891 Personal history of nicotine dependence: Secondary | ICD-10-CM | POA: Insufficient documentation

## 2016-07-19 DIAGNOSIS — I10 Essential (primary) hypertension: Secondary | ICD-10-CM | POA: Diagnosis not present

## 2016-07-19 LAB — CBC WITH DIFFERENTIAL/PLATELET
Basophils Absolute: 0.1 10*3/uL (ref 0–0.1)
Basophils Relative: 1 %
Eosinophils Absolute: 0.1 10*3/uL (ref 0–0.7)
Eosinophils Relative: 1 %
HCT: 31.6 % — ABNORMAL LOW (ref 35.0–47.0)
Hemoglobin: 10.5 g/dL — ABNORMAL LOW (ref 12.0–16.0)
Lymphocytes Relative: 38 %
Lymphs Abs: 3.6 10*3/uL (ref 1.0–3.6)
MCH: 24.9 pg — ABNORMAL LOW (ref 26.0–34.0)
MCHC: 33.1 g/dL (ref 32.0–36.0)
MCV: 75.2 fL — ABNORMAL LOW (ref 80.0–100.0)
Monocytes Absolute: 0.9 10*3/uL (ref 0.2–0.9)
Monocytes Relative: 9 %
Neutro Abs: 4.8 10*3/uL (ref 1.4–6.5)
Neutrophils Relative %: 51 %
Platelets: 304 10*3/uL (ref 150–440)
RBC: 4.2 MIL/uL (ref 3.80–5.20)
RDW: 19.8 % — ABNORMAL HIGH (ref 11.5–14.5)
WBC: 9.4 10*3/uL (ref 3.6–11.0)

## 2016-07-19 LAB — SEDIMENTATION RATE: Sed Rate: 79 mm/hr — ABNORMAL HIGH (ref 0–30)

## 2016-07-19 LAB — FERRITIN: Ferritin: 84 ng/mL (ref 11–307)

## 2016-07-19 NOTE — Progress Notes (Signed)
[place] [facility] day:  07/19/2016  Chief Complaint: Jennifer Carrillo is a 83 y.o. female with stage IA left breast cancer and iron deficiency anemia who is seen for 6 week assessment.  HPI: The patient was last seen in the hematology clinic on 06/07/2016.  At that time, she was feeling a little better.  Anemia was improving with Venofer support.  Urinalysis revealed no RBCs.  Hemoglobin was 10.2.  She received Venofer x 3 (05/31, 06/07, and 06/21/2016).  She notes resolution of pica.  She states that she was to wait on a GI appointment.  She was seen at [facility] by Dr. Su Grand on 06/26/2016.  Discussions were held regarding dental clearance to start bisphosphonates.  Bone density study on 03/20/2006 was normal with a T score of 0.7 and L1-L4.  Mammogram is scheduled in 12/2016.  Symptomatically, she denies any new complaints. She notes shortness of breath with exertion for years.   Past Medical History:  Diagnosis Date  . Arthritis   . Asthma   . Calf pain   . COPD (chronic obstructive pulmonary disease) ([place])   . Diabetes ([place])   . Discoid lupus   . Gastro-esophageal reflux   . Glaucoma   . Hyperlipemia   . Hypertension   . Iron deficiency anemia   . Joint pain   . Leg swelling   . Osteoarthritis   . PVD (peripheral vascular disease) ([place])   . Sinus problem   . Sleep apnea   . Stroke [facility])     Past Surgical History:  Procedure Laterality Date  . BREAST SURGERY    . CAROTID ENDARTERECTOMY Right   . CATARACT EXTRACTION Right 2013  . CORONARY STENT PLACEMENT     L. L. E.  . TUBAL LIGATION      Family History  Problem Relation Age of Onset  . Heart disease Mother   . Diabetes Other   . Hypertension Other   . Diabetes Sister   . Lung cancer Brother   . Leukemia Daughter   . Bladder Cancer Neg Hx   . Kidney disease Neg Hx   . Prostate cancer Neg Hx     Social History:  reports that she has quit smoking. She has never  used smokeless tobacco. She reports that she does not drink alcohol or use drugs.  She lives in [place].  She lost her brother at age 22 last week to an aneurysm.  The patient is alone today.  Allergies:  Allergies  Allergen Reactions  . Benazepril Other (See Comments)  . Statins     Current Medications: Current Outpatient Prescriptions  Medication Sig Dispense Refill  . amLODipine (NORVASC) 10 MG tablet Take 10 mg by mouth daily.    Marland Kitchen aspirin 81 MG tablet Take 81 mg by mouth daily.    . digoxin (LANOXIN) 0.125 MG tablet Take by mouth daily.    Marland Kitchen docusate sodium (COLACE) 100 MG capsule Take 100 mg by mouth daily as needed for mild constipation.    . hydrochlorothiazide (HYDRODIURIL) 25 MG tablet Take by mouth.    . insulin aspart protamine- aspart (NOVOLOG MIX 70/30) (70-30) 100 UNIT/ML injection Inject 35 Units into the skin 2 (two) times daily with a meal.    . letrozole (FEMARA) 2.5 MG tablet Take 2.5 mg by mouth daily.    Marland Kitchen losartan (COZAAR) 100 MG tablet Take 100 mg by mouth daily.    . metFORMIN (GLUCOPHAGE) 1000 MG  tablet Take 1,000 mg by mouth 2 (two) times daily with a meal.    . pantoprazole (PROTONIX) 40 MG tablet Take 40 mg by mouth daily.    Marland Kitchen PROAIR HFA 108 (90 Base) MCG/ACT inhaler     . rOPINIRole (REQUIP) 0.5 MG tablet Take 0.5 mg by mouth at bedtime.    . ergocalciferol (DRISDOL) 50000 UNITS capsule Take 50,000 Units by mouth once a week.    . ezetimibe (ZETIA) 10 MG tablet Take 10 mg by mouth daily.    . ferrous sulfate 325 (65 FE) MG tablet Take 325 mg by mouth daily.    . hydrALAZINE (APRESOLINE) 10 MG tablet Take 10 mg by mouth 2 (two) times daily.    Marland Kitchen lisinopril (PRINIVIL,ZESTRIL) 40 MG tablet Take 40 mg by mouth daily.     No current facility-administered medications for this visit.     Review of Systems:  GENERAL:  Feels "better".  No fevers or sweats.  Weight stable. PERFORMANCE STATUS (ECOG):  1 HEENT:  No visual changes, runny nose, sore throat,  mouth sores or tenderness. Lungs: No shortness of breath or cough.  No hemoptysis. Cardiac:  No chest pain, palpitations, orthopnea, or PND. GI:  No nausea, vomiting, diarrhea, constipation, melena or hematochezia. Ice pica resolved. GU:  No urgency, frequency, dysuria, or hematuria. Musculoskeletal:  No back pain.  Left knee arthritis.  No muscle tenderness.  Bone density study normal. Extremities:  No pain or swelling. Skin:  No rashes or skin changes. Neuro:  No headache, numbness or weakness, balance or coordination issues. Endocrine:  Diabetes.  No thyroid issues, hot flashes or night sweats. Psych:  No mood changes, depression or anxiety.  Stress. Pain:  No focal pain. Review of systems:  All other systems reviewed and found to be negative.  Physical Exam: Blood pressure (!) 188/70, pulse 77, temperature (!) 96.5 F (35.8 C), temperature source Tympanic, resp. rate 20, weight 209 lb 8 oz (95 kg). GENERAL:  Well developed, well nourished, woman sitting comfortably in the exam room in no acute distress. MENTAL STATUS:  Alert and oriented to person, place and time. HEAD:  White curly short hair.  Normocephalic, atraumatic, face symmetric, no Cushingoid features. EYES:  Brown eyes.  Pupils equal round and reactive to light and accomodation.  No conjunctivitis or scleral icterus. ENT:  Oropharynx clear without lesion.  Tongue normal. Mucous membranes moist.  RESPIRATORY:  Clear to auscultation without rales, wheezes or rhonchi. CARDIOVASCULAR:  Regular rate and rhythm without murmur, rub or gallop. ABDOMEN:  Soft, non-tender, with active bowel sounds, and no appreciable hepatosplenomegaly.  No masses. SKIN:  No rashes, ulcers or lesions. EXTREMITIES: No edema, no skin discoloration or tenderness.  No palpable cords. LYMPH NODES: No palpable cervical, supraclavicular, axillary or inguinal adenopathy  NEUROLOGICAL: Unremarkable. PSYCH:  Appropriate.   Appointment on 07/19/2016   Component Date Value Ref Range Status  . WBC 07/19/2016 9.4  3.6 - 11.0 K/uL Final  . RBC 07/19/2016 4.20  3.80 - 5.20 MIL/uL Final  . Hemoglobin 07/19/2016 10.5* 12.0 - 16.0 g/dL Final  . HCT 07/19/2016 31.6* 35.0 - 47.0 % Final  . MCV 07/19/2016 75.2* 80.0 - 100.0 fL Final  . MCH 07/19/2016 24.9* 26.0 - 34.0 pg Final  . MCHC 07/19/2016 33.1  32.0 - 36.0 g/dL Final  . RDW 07/19/2016 19.8* 11.5 - 14.5 % Final  . Platelets 07/19/2016 304  150 - 440 K/uL Final  . Neutrophils Relative % 07/19/2016 51  %  Final  . Neutro Abs 07/19/2016 4.8  1.4 - 6.5 K/uL Final  . Lymphocytes Relative 07/19/2016 38  % Final  . Lymphs Abs 07/19/2016 3.6  1.0 - 3.6 K/uL Final  . Monocytes Relative 07/19/2016 9  % Final  . Monocytes Absolute 07/19/2016 0.9  0.2 - 0.9 K/uL Final  . Eosinophils Relative 07/19/2016 1  % Final  . Eosinophils Absolute 07/19/2016 0.1  0 - 0.7 K/uL Final  . Basophils Relative 07/19/2016 1  % Final  . Basophils Absolute 07/19/2016 0.1  0 - 0.1 K/uL Final    Assessment:  Jennifer Carrillo is an 83 y.o. female with stage IA left breast cancer and iron deficiency anemia.   She underwent left partial mastectomy at [facility] on 12/20/2015.  Pathology revealed a 1.4 cm invasive carcinoma with associated ductal carcinoma in situ. Surgical margins were negative.   Tumor was ER+(100%), PR+ (1-2%) , and Her2/neu 2+ (FISH -).  Pathologic stage was T1cNx.  Given her age, she was not felt to  benefit from radiation or chemotherapy.  She began Femara on 12/27/2015.  CA27.29 has been followed: 29.3 on 04/27/2016.  Bone density study at [facility] on 03/19/2016 was normal with a T score of 0.7 in L1-L4 and 0 in the left femur.  She has had iron defciency for 15 years.  She has been off/on oral iron (last 3 1/2 years ago).  Diet appears good.  She denies any melena, hematochezia, hematuria, or vaginal bleeding.  She has had ice pica for 7-8 years.  Ice pica has resolved.  EGD in 2015 which was "okay", per  patient report.  Colonoscopy on 06/23/2013 revealed a single nonbleeding angioectasia. There were internal and external hemorrhoids.  Urinalysis on 06/07/2016 revealed no RBCs.  Labs on 11/01/2015 revealed the following: hematocrit 29.5, hemoglobin 8.9, MCV 70, platelets 379,000, white count 8400 with an ANC of 4500.  Creatinine was 1.3 (CrCl 39-45 ml/min).  B12 and folate were normal.  Ferritin was 39.  She received 400 mg Venofer (12/30/2015 - 01/06/2016), 400 mg (01/30/2016, 04/30/2016), and 600 mg (06/07/2016 - 06/21/2016).    Ferritin has been followed: 19 on 12/08/2015, 36 on 12/29/2015, 110 on 01/06/2016, 104 on 01/20/2016, 78 on 01/30/2016, 93 on 03/12/2016, 37 on 04/27/2016, 32 on 06/06/2016, and 84 on 07/19/2016.  ESR is elevated (69-100) which will falsely increase ferritin.  Symptomatically, she denies any complaint.  Exam is stable.  Hematocrit is 31.6 with a hemoglobin of 10.5.   MCV is 75.2.  Sed rate is 79.  Plan: 1.  Labs today:  CBC with diff, ferritin, sed rate. 2.  Discuss labs.  Hematocrit is stable despite recent IV iron.  She may have an underlying thalassesmia.  Discuss plan to follow-up with GI.  Patient wishes to defer.   3.  Discuss interval bone density study-normal. 4.  RN to call patient with ferritin. 5.  RTC in 3 months for MD assessment and labs (CBC with diff, ferritin, sed rate).   Lequita Asal, MD  07/19/2016, 3:30 PM

## 2016-07-19 NOTE — Progress Notes (Signed)
Patient offers no complaints today. 

## 2016-07-23 DIAGNOSIS — G4762 Sleep related leg cramps: Secondary | ICD-10-CM | POA: Diagnosis not present

## 2016-07-23 DIAGNOSIS — C50919 Malignant neoplasm of unspecified site of unspecified female breast: Secondary | ICD-10-CM | POA: Diagnosis not present

## 2016-07-23 DIAGNOSIS — Z6832 Body mass index (BMI) 32.0-32.9, adult: Secondary | ICD-10-CM | POA: Diagnosis not present

## 2016-07-23 DIAGNOSIS — H9113 Presbycusis, bilateral: Secondary | ICD-10-CM | POA: Diagnosis not present

## 2016-07-23 DIAGNOSIS — Z0000 Encounter for general adult medical examination without abnormal findings: Secondary | ICD-10-CM | POA: Diagnosis not present

## 2016-07-23 DIAGNOSIS — Z7982 Long term (current) use of aspirin: Secondary | ICD-10-CM | POA: Diagnosis not present

## 2016-07-23 DIAGNOSIS — I10 Essential (primary) hypertension: Secondary | ICD-10-CM | POA: Diagnosis not present

## 2016-07-23 DIAGNOSIS — I4891 Unspecified atrial fibrillation: Secondary | ICD-10-CM | POA: Diagnosis not present

## 2016-07-23 DIAGNOSIS — E119 Type 2 diabetes mellitus without complications: Secondary | ICD-10-CM | POA: Diagnosis not present

## 2016-07-23 DIAGNOSIS — N39498 Other specified urinary incontinence: Secondary | ICD-10-CM | POA: Diagnosis not present

## 2016-07-23 DIAGNOSIS — Z794 Long term (current) use of insulin: Secondary | ICD-10-CM | POA: Diagnosis not present

## 2016-07-23 DIAGNOSIS — M130 Polyarthritis, unspecified: Secondary | ICD-10-CM | POA: Diagnosis not present

## 2016-07-23 DIAGNOSIS — E669 Obesity, unspecified: Secondary | ICD-10-CM | POA: Diagnosis not present

## 2016-07-23 DIAGNOSIS — D509 Iron deficiency anemia, unspecified: Secondary | ICD-10-CM | POA: Diagnosis not present

## 2016-07-23 DIAGNOSIS — I4892 Unspecified atrial flutter: Secondary | ICD-10-CM | POA: Diagnosis not present

## 2016-08-03 DIAGNOSIS — K219 Gastro-esophageal reflux disease without esophagitis: Secondary | ICD-10-CM | POA: Diagnosis not present

## 2016-08-03 DIAGNOSIS — I482 Chronic atrial fibrillation: Secondary | ICD-10-CM | POA: Diagnosis not present

## 2016-08-03 DIAGNOSIS — G2581 Restless legs syndrome: Secondary | ICD-10-CM | POA: Diagnosis not present

## 2016-08-03 DIAGNOSIS — I10 Essential (primary) hypertension: Secondary | ICD-10-CM | POA: Diagnosis not present

## 2016-08-03 DIAGNOSIS — Z0001 Encounter for general adult medical examination with abnormal findings: Secondary | ICD-10-CM | POA: Diagnosis not present

## 2016-08-03 DIAGNOSIS — N182 Chronic kidney disease, stage 2 (mild): Secondary | ICD-10-CM | POA: Diagnosis not present

## 2016-08-03 DIAGNOSIS — C50912 Malignant neoplasm of unspecified site of left female breast: Secondary | ICD-10-CM | POA: Diagnosis not present

## 2016-08-03 DIAGNOSIS — E1165 Type 2 diabetes mellitus with hyperglycemia: Secondary | ICD-10-CM | POA: Diagnosis not present

## 2016-09-03 DIAGNOSIS — E1165 Type 2 diabetes mellitus with hyperglycemia: Secondary | ICD-10-CM | POA: Diagnosis not present

## 2016-09-03 DIAGNOSIS — R634 Abnormal weight loss: Secondary | ICD-10-CM | POA: Diagnosis not present

## 2016-09-03 DIAGNOSIS — I10 Essential (primary) hypertension: Secondary | ICD-10-CM | POA: Diagnosis not present

## 2016-09-03 DIAGNOSIS — N182 Chronic kidney disease, stage 2 (mild): Secondary | ICD-10-CM | POA: Diagnosis not present

## 2016-09-03 DIAGNOSIS — M064 Inflammatory polyarthropathy: Secondary | ICD-10-CM | POA: Diagnosis not present

## 2016-10-05 DIAGNOSIS — R944 Abnormal results of kidney function studies: Secondary | ICD-10-CM | POA: Diagnosis not present

## 2016-10-05 DIAGNOSIS — E1165 Type 2 diabetes mellitus with hyperglycemia: Secondary | ICD-10-CM | POA: Diagnosis not present

## 2016-10-05 DIAGNOSIS — I10 Essential (primary) hypertension: Secondary | ICD-10-CM | POA: Diagnosis not present

## 2016-10-05 DIAGNOSIS — Z23 Encounter for immunization: Secondary | ICD-10-CM | POA: Diagnosis not present

## 2016-10-09 DIAGNOSIS — N182 Chronic kidney disease, stage 2 (mild): Secondary | ICD-10-CM | POA: Diagnosis not present

## 2016-10-09 DIAGNOSIS — E1165 Type 2 diabetes mellitus with hyperglycemia: Secondary | ICD-10-CM | POA: Diagnosis not present

## 2016-10-18 ENCOUNTER — Other Ambulatory Visit: Payer: Medicare HMO

## 2016-10-18 ENCOUNTER — Ambulatory Visit: Payer: Medicare HMO | Admitting: Hematology and Oncology

## 2016-10-19 ENCOUNTER — Inpatient Hospital Stay: Payer: Medicare HMO | Admitting: Hematology and Oncology

## 2016-10-19 ENCOUNTER — Inpatient Hospital Stay: Payer: Medicare HMO

## 2016-10-19 NOTE — Progress Notes (Unsigned)
[place] [facility] day:  10/19/2016  Chief Complaint: Jennifer Carrillo is a 83 y.o. female with stage IA left breast cancer and iron deficiency anemia who is seen for 3 month assessment.  HPI: The patient was last seen in the hematology clinic on 07/19/2016.  At that time, she denied any complaint.  Exam was stable.  Hematocrit was 31.6 with a hemoglobin of 10.5.   MCV was 75.2.  Ferritin was 84.  Sed rate was 79.  As her hematocrit was stable despite recent IV iron, decision was made to hold additional IV iron.  She declined assessment by GI.  As her MCV had been small on review of prior CBCs, differential included thalassemia.   she was feeling a little better.  Anemia was improving with Venofer support.  Urinalysis revealed no RBCs.  Hemoglobin was 10.2.  She received Venofer x 3 (05/31, 06/07, and 06/21/2016).  She notes resolution of pica.  She states that she was to wait on a GI appointment.  She was seen at [facility] by Dr. Su Grand on 06/26/2016.  Discussions were held regarding dental clearance to start bisphosphonates.  Bone density study on 03/20/2006 was normal with a T score of 0.7 and L1-L4.  Mammogram is scheduled in 12/2016.  Symptomatically, she denies any new complaints. She notes shortness of breath with exertion for years.   Past Medical History:  Diagnosis Date  . Arthritis   . Asthma   . Calf pain   . COPD (chronic obstructive pulmonary disease) ([place])   . Diabetes ([place])   . Discoid lupus   . Gastro-esophageal reflux   . Glaucoma   . Hyperlipemia   . Hypertension   . Iron deficiency anemia   . Joint pain   . Leg swelling   . Osteoarthritis   . PVD (peripheral vascular disease) ([place])   . Sinus problem   . Sleep apnea   . Stroke [facility]     Past Surgical History:  Procedure Laterality Date  . BREAST SURGERY    . CAROTID ENDARTERECTOMY Right   . CATARACT EXTRACTION Right 2013  . CORONARY STENT PLACEMENT     L.  L. E.  . TUBAL LIGATION      Family History  Problem Relation Age of Onset  . Heart disease Mother   . Diabetes Other   . Hypertension Other   . Diabetes Sister   . Lung cancer Brother   . Leukemia Daughter   . Bladder Cancer Neg Hx   . Kidney disease Neg Hx   . Prostate cancer Neg Hx     Social History:  reports that she has quit smoking. She has never used smokeless tobacco. She reports that she does not drink alcohol or use drugs.  She lives in [place].  She lost her brother at age 75 last week to an aneurysm.  The patient is alone today.  Allergies:  Allergies  Allergen Reactions  . Benazepril Other (See Comments)  . Statins     Current Medications: Current Outpatient Prescriptions  Medication Sig Dispense Refill  . amLODipine (NORVASC) 10 MG tablet Take 10 mg by mouth daily.    Marland Kitchen aspirin 81 MG tablet Take 81 mg by mouth daily.    . digoxin (LANOXIN) 0.125 MG tablet Take by mouth daily.    Marland Kitchen docusate sodium (COLACE) 100 MG capsule Take 100 mg by mouth daily as needed for mild constipation.    . ergocalciferol (DRISDOL)  50000 UNITS capsule Take 50,000 Units by mouth once a week.    . ezetimibe (ZETIA) 10 MG tablet Take 10 mg by mouth daily.    . ferrous sulfate 325 (65 FE) MG tablet Take 325 mg by mouth daily.    . hydrALAZINE (APRESOLINE) 10 MG tablet Take 10 mg by mouth 2 (two) times daily.    . hydrochlorothiazide (HYDRODIURIL) 25 MG tablet Take by mouth.    . insulin aspart protamine- aspart (NOVOLOG MIX 70/30) (70-30) 100 UNIT/ML injection Inject 35 Units into the skin 2 (two) times daily with a meal.    . letrozole (FEMARA) 2.5 MG tablet Take 2.5 mg by mouth daily.    Marland Kitchen lisinopril (PRINIVIL,ZESTRIL) 40 MG tablet Take 40 mg by mouth daily.    Marland Kitchen losartan (COZAAR) 100 MG tablet Take 100 mg by mouth daily.    . metFORMIN (GLUCOPHAGE) 1000 MG tablet Take 1,000 mg by mouth 2 (two) times daily with a meal.    . pantoprazole (PROTONIX) 40 MG tablet Take 40 mg by mouth  daily.    Marland Kitchen PROAIR HFA 108 (90 Base) MCG/ACT inhaler     . rOPINIRole (REQUIP) 0.5 MG tablet Take 0.5 mg by mouth at bedtime.     No current facility-administered medications for this visit.     Review of Systems:  GENERAL:  Feels "better".  No fevers or sweats.  Weight stable. PERFORMANCE STATUS (ECOG):  1 HEENT:  No visual changes, runny nose, sore throat, mouth sores or tenderness. Lungs: No shortness of breath or cough.  No hemoptysis. Cardiac:  No chest pain, palpitations, orthopnea, or PND. GI:  No nausea, vomiting, diarrhea, constipation, melena or hematochezia. Ice pica resolved. GU:  No urgency, frequency, dysuria, or hematuria. Musculoskeletal:  No back pain.  Left knee arthritis.  No muscle tenderness.  Bone density study normal. Extremities:  No pain or swelling. Skin:  No rashes or skin changes. Neuro:  No headache, numbness or weakness, balance or coordination issues. Endocrine:  Diabetes.  No thyroid issues, hot flashes or night sweats. Psych:  No mood changes, depression or anxiety.  Stress. Pain:  No focal pain. Review of systems:  All other systems reviewed and found to be negative.  Physical Exam: There were no vitals taken for this visit. GENERAL:  Well developed, well nourished, woman sitting comfortably in the exam room in no acute distress. MENTAL STATUS:  Alert and oriented to person, place and time. HEAD:  White curly short hair.  Normocephalic, atraumatic, face symmetric, no Cushingoid features. EYES:  Brown eyes.  Pupils equal round and reactive to light and accomodation.  No conjunctivitis or scleral icterus. ENT:  Oropharynx clear without lesion.  Tongue normal. Mucous membranes moist.  RESPIRATORY:  Clear to auscultation without rales, wheezes or rhonchi. CARDIOVASCULAR:  Regular rate and rhythm without murmur, rub or gallop. ABDOMEN:  Soft, non-tender, with active bowel sounds, and no appreciable hepatosplenomegaly.  No masses. SKIN:  No rashes, ulcers  or lesions. EXTREMITIES: No edema, no skin discoloration or tenderness.  No palpable cords. LYMPH NODES: No palpable cervical, supraclavicular, axillary or inguinal adenopathy  NEUROLOGICAL: Unremarkable. PSYCH:  Appropriate.   No visits with results within 3 Day(s) from this visit.  Latest known visit with results is:  Appointment on 07/19/2016  Component Date Value Ref Range Status  . WBC 07/19/2016 9.4  3.6 - 11.0 K/uL Final  . RBC 07/19/2016 4.20  3.80 - 5.20 MIL/uL Final  . Hemoglobin 07/19/2016 10.5* 12.0 - 16.0  g/dL Final  . HCT 07/19/2016 31.6* 35.0 - 47.0 % Final  . MCV 07/19/2016 75.2* 80.0 - 100.0 fL Final  . MCH 07/19/2016 24.9* 26.0 - 34.0 pg Final  . MCHC 07/19/2016 33.1  32.0 - 36.0 g/dL Final  . RDW 07/19/2016 19.8* 11.5 - 14.5 % Final  . Platelets 07/19/2016 304  150 - 440 K/uL Final  . Neutrophils Relative % 07/19/2016 51  % Final  . Neutro Abs 07/19/2016 4.8  1.4 - 6.5 K/uL Final  . Lymphocytes Relative 07/19/2016 38  % Final  . Lymphs Abs 07/19/2016 3.6  1.0 - 3.6 K/uL Final  . Monocytes Relative 07/19/2016 9  % Final  . Monocytes Absolute 07/19/2016 0.9  0.2 - 0.9 K/uL Final  . Eosinophils Relative 07/19/2016 1  % Final  . Eosinophils Absolute 07/19/2016 0.1  0 - 0.7 K/uL Final  . Basophils Relative 07/19/2016 1  % Final  . Basophils Absolute 07/19/2016 0.1  0 - 0.1 K/uL Final  . Ferritin 07/19/2016 84  11 - 307 ng/mL Final  . Sed Rate 07/19/2016 79* 0 - 30 mm/hr Final    Assessment:  PATTIJO JUSTE is an 83 y.o. female with stage IA left breast cancer and iron deficiency anemia.   She underwent left partial mastectomy at [facility] on 12/20/2015.  Pathology revealed a 1.4 cm invasive carcinoma with associated ductal carcinoma in situ. Surgical margins were negative.   Tumor was ER+(100%), PR+ (1-2%) , and Her2/neu 2+ (FISH -).  Pathologic stage was T1cNx.  Given her age, she was not felt to  benefit from radiation or chemotherapy.  She began Femara on  12/27/2015.  CA27.29 has been followed: 29.3 on 04/27/2016.  Bone density study at [facility] on 03/19/2016 was normal with a T score of 0.7 in L1-L4 and 0 in the left femur.  She has had iron defciency for 15 years.  She has been off/on oral iron (last 3 1/2 years ago).  Diet appears good.  She denies any melena, hematochezia, hematuria, or vaginal bleeding.  She has had ice pica for 7-8 years.  Ice pica has resolved.  EGD in 2015 which was "okay", per patient report.  Colonoscopy on 06/23/2013 revealed a single nonbleeding angioectasia. There were internal and external hemorrhoids.  Urinalysis on 06/07/2016 revealed no RBCs.  Labs on 11/01/2015 revealed the following: hematocrit 29.5, hemoglobin 8.9, MCV 70, platelets 379,000, white count 8400 with an ANC of 4500.  Creatinine was 1.3 (CrCl 39-45 ml/min).  B12 and folate were normal.  Ferritin was 39.  She received 400 mg Venofer (12/30/2015 - 01/06/2016), 400 mg (01/30/2016, 04/30/2016), and 600 mg (06/07/2016 - 06/21/2016).    Ferritin has been followed: 19 on 12/08/2015, 36 on 12/29/2015, 110 on 01/06/2016, 104 on 01/20/2016, 78 on 01/30/2016, 93 on 03/12/2016, 37 on 04/27/2016, 32 on 06/06/2016, and 84 on 07/19/2016.  ESR is elevated (69-100) which will falsely increase ferritin.  Symptomatically, she denies any complaint.  Exam is stable.  Hematocrit is 31.6 with a hemoglobin of 10.5.   MCV is 75.2.  Sed rate is 79.  Plan: 1.  Labs today:  CBC with diff, ferritin, sed rate, hemoglobin electrophoresis.  2.  Discuss labs.  Hematocrit is stable despite recent IV iron.  She may have an underlying thalassesmia.  Discuss plan to follow-up with GI.  Patient wishes to defer.   5.  RTC in 3 months for MD assessment and labs (CBC with diff, ferritin, sed rate).   Devereaux Grayson C  Mike Gip, MD  10/19/2016, 5:18 AM   I saw and evaluated the patient, participating in the key portions of the service and reviewing pertinent diagnostic studies and records.  I  reviewed the nurse practitioner's note and agree with the findings and the plan.  The assessment and plan were discussed with the patient.  Additional diagnostic studies of *** are needed to clarify *** and would change the clinical management.  A few ***multiple questions were asked by the patient and answered.   Nolon Stalls, MD 10/19/2016,5:35 AM

## 2016-10-29 DIAGNOSIS — R269 Unspecified abnormalities of gait and mobility: Secondary | ICD-10-CM | POA: Diagnosis not present

## 2016-10-31 DIAGNOSIS — E1165 Type 2 diabetes mellitus with hyperglycemia: Secondary | ICD-10-CM | POA: Diagnosis not present

## 2016-10-31 DIAGNOSIS — R944 Abnormal results of kidney function studies: Secondary | ICD-10-CM | POA: Diagnosis not present

## 2016-10-31 DIAGNOSIS — I10 Essential (primary) hypertension: Secondary | ICD-10-CM | POA: Diagnosis not present

## 2016-10-31 DIAGNOSIS — G2581 Restless legs syndrome: Secondary | ICD-10-CM | POA: Diagnosis not present

## 2016-11-26 DIAGNOSIS — E1122 Type 2 diabetes mellitus with diabetic chronic kidney disease: Secondary | ICD-10-CM | POA: Diagnosis not present

## 2016-11-26 DIAGNOSIS — R944 Abnormal results of kidney function studies: Secondary | ICD-10-CM | POA: Diagnosis not present

## 2016-12-20 ENCOUNTER — Ambulatory Visit (INDEPENDENT_AMBULATORY_CARE_PROVIDER_SITE_OTHER): Payer: Medicare HMO | Admitting: Internal Medicine

## 2016-12-20 ENCOUNTER — Encounter: Payer: Self-pay | Admitting: Internal Medicine

## 2016-12-20 VITALS — BP 140/80 | HR 80 | Resp 16 | Ht 67.0 in | Wt 209.0 lb

## 2016-12-20 DIAGNOSIS — E782 Mixed hyperlipidemia: Secondary | ICD-10-CM

## 2016-12-20 DIAGNOSIS — M19042 Primary osteoarthritis, left hand: Secondary | ICD-10-CM

## 2016-12-20 DIAGNOSIS — E1165 Type 2 diabetes mellitus with hyperglycemia: Secondary | ICD-10-CM | POA: Diagnosis not present

## 2016-12-20 DIAGNOSIS — I10 Essential (primary) hypertension: Secondary | ICD-10-CM

## 2016-12-20 DIAGNOSIS — E1122 Type 2 diabetes mellitus with diabetic chronic kidney disease: Secondary | ICD-10-CM | POA: Insufficient documentation

## 2016-12-20 DIAGNOSIS — M19041 Primary osteoarthritis, right hand: Secondary | ICD-10-CM

## 2016-12-20 DIAGNOSIS — N186 End stage renal disease: Secondary | ICD-10-CM

## 2016-12-20 NOTE — Progress Notes (Signed)
   Subjective:    Patient ID: Jennifer Carrillo, female    DOB: July 23, 1933, 83 y.o.   MRN: [id number] Chief Complaint  Patient presents with  . Diabetes  . Hyperlipidemia  . Hypertension   HPI Pt is here for f/u on her u/s results.    Review of Systems  HENT: Negative for sinus pressure and sore throat.   Eyes: Positive for itching.  Cardiovascular: Negative for palpitations.  Musculoskeletal: Positive for arthralgias, joint swelling and myalgias.  Skin: Negative.   Psychiatric/Behavioral: Negative.        Objective:   Physical Exam  Constitutional: She appears well-developed.  HENT:  Head: Normocephalic.  Eyes: Pupils are equal, round, and reactive to light.  Neck: Normal range of motion.  Cardiovascular: Normal rate and regular rhythm.  Pulmonary/Chest: Effort normal and breath sounds normal. No respiratory distress. She has no wheezes. She has no rales. She exhibits no tenderness.  Abdominal: Soft.  Musculoskeletal: She exhibits edema.  Swelling PIP right hand  Neurological: She is alert.          Assessment & Plan:  1. Uncontrolled type 2 diabetes mellitus with hyperglycemia ([place]) Pt continues to monitor her glucose at home.  Improve diet 2. Primary osteoarthritis of both hands Tylenol arthritis prn.  3. Essential hypertension, benign Control bp aggressively   4. Mixed hyperlipidemia Low fat diet

## 2016-12-20 NOTE — Patient Instructions (Addendum)

## 2016-12-31 ENCOUNTER — Telehealth: Payer: Self-pay

## 2016-12-31 NOTE — Telephone Encounter (Signed)
Pt's husband called requesting a shower chair.  Advised that it was mentioned at last visit.  dbs

## 2017-01-07 NOTE — Progress Notes (Signed)
Husband called and requested a shower chair for pt.  Got signed order from [facility] and faxed to [place].  dbs

## 2017-01-23 DIAGNOSIS — K219 Gastro-esophageal reflux disease without esophagitis: Secondary | ICD-10-CM | POA: Diagnosis not present

## 2017-01-23 DIAGNOSIS — Z170 Estrogen receptor positive status [ER+]: Secondary | ICD-10-CM | POA: Diagnosis not present

## 2017-01-23 DIAGNOSIS — E611 Iron deficiency: Secondary | ICD-10-CM | POA: Diagnosis not present

## 2017-01-23 DIAGNOSIS — E1122 Type 2 diabetes mellitus with diabetic chronic kidney disease: Secondary | ICD-10-CM | POA: Diagnosis not present

## 2017-01-23 DIAGNOSIS — I129 Hypertensive chronic kidney disease with stage 1 through stage 4 chronic kidney disease, or unspecified chronic kidney disease: Secondary | ICD-10-CM | POA: Diagnosis not present

## 2017-01-23 DIAGNOSIS — E875 Hyperkalemia: Secondary | ICD-10-CM | POA: Diagnosis not present

## 2017-01-23 DIAGNOSIS — C50912 Malignant neoplasm of unspecified site of left female breast: Secondary | ICD-10-CM | POA: Diagnosis not present

## 2017-01-23 DIAGNOSIS — D509 Iron deficiency anemia, unspecified: Secondary | ICD-10-CM | POA: Diagnosis not present

## 2017-01-23 DIAGNOSIS — R252 Cramp and spasm: Secondary | ICD-10-CM | POA: Diagnosis not present

## 2017-01-23 DIAGNOSIS — N189 Chronic kidney disease, unspecified: Secondary | ICD-10-CM | POA: Diagnosis not present

## 2017-01-23 DIAGNOSIS — R922 Inconclusive mammogram: Secondary | ICD-10-CM | POA: Diagnosis not present

## 2017-01-23 DIAGNOSIS — M1990 Unspecified osteoarthritis, unspecified site: Secondary | ICD-10-CM | POA: Diagnosis not present

## 2017-02-21 ENCOUNTER — Ambulatory Visit: Payer: Self-pay | Admitting: Internal Medicine

## 2017-03-06 ENCOUNTER — Ambulatory Visit (INDEPENDENT_AMBULATORY_CARE_PROVIDER_SITE_OTHER): Payer: Medicare HMO | Admitting: Internal Medicine

## 2017-03-06 ENCOUNTER — Encounter: Payer: Self-pay | Admitting: Internal Medicine

## 2017-03-06 VITALS — BP 178/72 | HR 74 | Resp 16 | Ht 67.0 in | Wt 210.0 lb

## 2017-03-06 DIAGNOSIS — E1165 Type 2 diabetes mellitus with hyperglycemia: Secondary | ICD-10-CM

## 2017-03-06 DIAGNOSIS — C50512 Malignant neoplasm of lower-outer quadrant of left female breast: Secondary | ICD-10-CM | POA: Diagnosis not present

## 2017-03-06 DIAGNOSIS — E782 Mixed hyperlipidemia: Secondary | ICD-10-CM

## 2017-03-06 DIAGNOSIS — D509 Iron deficiency anemia, unspecified: Secondary | ICD-10-CM

## 2017-03-06 DIAGNOSIS — I482 Chronic atrial fibrillation, unspecified: Secondary | ICD-10-CM

## 2017-03-06 DIAGNOSIS — Z170 Estrogen receptor positive status [ER+]: Secondary | ICD-10-CM | POA: Diagnosis not present

## 2017-03-06 DIAGNOSIS — M25561 Pain in right knee: Secondary | ICD-10-CM | POA: Diagnosis not present

## 2017-03-06 DIAGNOSIS — N393 Stress incontinence (female) (male): Secondary | ICD-10-CM

## 2017-03-06 DIAGNOSIS — C50112 Malignant neoplasm of central portion of left female breast: Secondary | ICD-10-CM | POA: Diagnosis not present

## 2017-03-06 DIAGNOSIS — I10 Essential (primary) hypertension: Secondary | ICD-10-CM

## 2017-03-06 LAB — POCT GLYCOSYLATED HEMOGLOBIN (HGB A1C): Hemoglobin A1C: 8.5

## 2017-03-06 MED ORDER — OXYBUTYNIN CHLORIDE ER 10 MG PO TB24: 10.0000 mg | ORAL_TABLET | Freq: Every day | ORAL | 3 refills | Status: DC

## 2017-03-06 MED ORDER — AMLODIPINE BESYLATE 10 MG PO TABS: 10.0000 mg | ORAL_TABLET | Freq: Every day | ORAL | 3 refills | Status: DC

## 2017-03-06 MED ORDER — ROPINIROLE HCL 0.5 MG PO TABS: 0.5000 mg | ORAL_TABLET | Freq: Every day | ORAL | 3 refills | Status: DC

## 2017-03-06 MED ORDER — HYDRALAZINE HCL 25 MG PO TABS: 25.0000 mg | ORAL_TABLET | Freq: Two times a day (BID) | ORAL | 3 refills | Status: DC

## 2017-03-06 MED ORDER — DIGOXIN 125 MCG PO TABS: 0.1250 mg | ORAL_TABLET | Freq: Every day | ORAL | 3 refills | Status: DC

## 2017-03-06 MED ORDER — LETROZOLE 2.5 MG PO TABS: 2.5000 mg | ORAL_TABLET | Freq: Every day | ORAL | 3 refills | Status: DC

## 2017-03-06 MED ORDER — EZETIMIBE 10 MG PO TABS: 10.0000 mg | ORAL_TABLET | Freq: Every day | ORAL | 3 refills | Status: DC

## 2017-03-06 NOTE — Progress Notes (Signed)
[facility] [place] [place] [address]  Internal MEDICINE  Office Visit Note  Patient Name: Jennifer Carrillo  [id number]  [id number]  Date of Service: 04/09/2017  Chief Complaint  Patient presents with  . Follow-up  . Diabetes  . Knee Pain    Diabetes  She presents for her follow-up diabetic visit. She has type 2 diabetes mellitus. Her disease course has been stable. Pertinent negatives for hypoglycemia include no nervousness/anxiousness or tremors. Pertinent negatives for diabetes include no chest pain and no fatigue. She is following a diabetic diet. Her breakfast blood glucose range is generally 130-140 mg/dl. An ACE inhibitor/angiotensin II receptor blocker is being taken. She sees a podiatrist.Eye exam is current.  Knee Pain   There was no injury mechanism (Due to arthritis ). The pain is present in the right knee. The pain is at a severity of 8/10. The pain is moderate. The pain has been worsening since onset. Associated symptoms include an inability to bear weight. Pertinent negatives include no numbness. The symptoms are aggravated by movement and weight bearing. She has tried nothing for the symptoms.     Current Medication: Outpatient Encounter Medications as of 03/06/2017  Medication Sig Note  . ACCU-CHEK FASTCLIX LANCETS MISC by Does not apply route 3 (three) times daily after meals.   Marland Kitchen amLODipine (NORVASC) 10 MG tablet Take 1 tablet (10 mg total) by mouth daily.   Marland Kitchen aspirin 81 MG tablet Take 81 mg by mouth daily.   . diclofenac sodium (VOLTAREN) 1 % GEL Apply topically.   . digoxin (LANOXIN) 0.125 MG tablet Take 1 tablet (0.125 mg total) by mouth daily.   Marland Kitchen docusate sodium (COLACE) 100 MG capsule Take 100 mg by mouth daily as needed for mild constipation.   Marland Kitchen glucose blood (ACCU-CHEK SMARTVIEW) test strip 3 each by Other route 3 (three) times daily after meals. Use as instructed   . hydrALAZINE (APRESOLINE) 25 MG tablet Take 1 tablet (25 mg  total) by mouth 2 times daily at 12 noon and 4 pm.   . insulin aspart protamine- aspart (NOVOLOG MIX 70/30) (70-30) 100 UNIT/ML injection Inject 35 Units into the skin 2 (two) times daily with a meal.   . letrozole (FEMARA) 2.5 MG tablet Take 1 tablet (2.5 mg total) by mouth daily.   . pantoprazole (PROTONIX) 40 MG tablet Take 40 mg by mouth daily.   Marland Kitchen PROAIR HFA 108 (90 Base) MCG/ACT inhaler    . [DISCONTINUED] amLODipine (NORVASC) 10 MG tablet Take 10 mg by mouth daily.   . [DISCONTINUED] digoxin (LANOXIN) 0.125 MG tablet Take by mouth daily.   . [DISCONTINUED] hydrALAZINE (APRESOLINE) 25 MG tablet Take 25 mg by mouth 2 times daily at 12 noon and 4 pm.   . [DISCONTINUED] letrozole (FEMARA) 2.5 MG tablet Take 2.5 mg by mouth daily.   . [DISCONTINUED] lisinopril (PRINIVIL,ZESTRIL) 40 MG tablet Take 40 mg by mouth daily.   . [DISCONTINUED] losartan (COZAAR) 100 MG tablet Take 100 mg by mouth daily.   . [DISCONTINUED] spironolactone (ALDACTONE) 100 MG tablet Take by mouth.   . Aspirin-Calcium Carbonate 81-777 MG TABS Take 81 mg by mouth.   . ergocalciferol (DRISDOL) 50000 UNITS capsule Take 50,000 Units by mouth once a week.   . ezetimibe (ZETIA) 10 MG tablet Take 1 tablet (10 mg total) by mouth daily.   . ferrous sulfate 325 (65 FE) MG tablet Take 325 mg by mouth daily.   Marland Kitchen oxybutynin (DITROPAN XL) 10  MG 24 hr tablet Take 1 tablet (10 mg total) by mouth at bedtime.   Marland Kitchen rOPINIRole (REQUIP) 0.5 MG tablet Take 1 tablet (0.5 mg total) by mouth at bedtime.   . [DISCONTINUED] carvedilol (COREG) 25 MG tablet Take by mouth.   . [DISCONTINUED] conjugated estrogens (PREMARIN) vaginal cream Place vaginally.   . [DISCONTINUED] ezetimibe (ZETIA) 10 MG tablet Take 10 mg by mouth daily.   . [DISCONTINUED] hydrochlorothiazide (HYDRODIURIL) 25 MG tablet Take by mouth. 11/17/2015: Received from: [place]: Take 25 mg by mouth once daily.  . [DISCONTINUED] metFORMIN (GLUCOPHAGE)  1000 MG tablet Take 1,000 mg by mouth 2 (two) times daily with a meal.   . [DISCONTINUED] metFORMIN (GLUCOPHAGE) 500 MG tablet Take 500 mg by mouth 2 (two) times daily with a meal.   . [DISCONTINUED] polyethylene glycol-electrolytes (TRILYTE) 420 g solution Take by mouth.   . [DISCONTINUED] rOPINIRole (REQUIP) 0.5 MG tablet Take 0.5 mg by mouth at bedtime.    No facility-administered encounter medications on file as of 03/06/2017.     Surgical History: Past Surgical History:  Procedure Laterality Date  . BREAST SURGERY    . CAROTID ENDARTERECTOMY Right   . CATARACT EXTRACTION Right 2013  . CORONARY STENT PLACEMENT     L. L. E.  . STENT PLACEMENT VASCULAR ([place] HX) Left    stent placement on LLE  . TUBAL LIGATION      Medical History: Past Medical History:  Diagnosis Date  . Arthritis   . Asthma   . Calf pain   . COPD (chronic obstructive pulmonary disease) ([place])   . Diabetes ([place])   . Discoid lupus   . Gastro-esophageal reflux   . Glaucoma   . Hyperlipemia   . Hypertension   . Iron deficiency anemia   . Joint pain   . Leg swelling   . Osteoarthritis   . PVD (peripheral vascular disease) ([place])   . Sinus problem   . Sleep apnea   . Stroke [facility])     Family History: Family History  Problem Relation Age of Onset  . Heart disease Mother   . Diabetes Other   . Hypertension Other   . Diabetes Sister   . Lung cancer Brother   . Leukemia Daughter   . Bladder Cancer Neg Hx   . Kidney disease Neg Hx   . Prostate cancer Neg Hx     Social History   Socioeconomic History  . Marital status: Married    Spouse name: Not on file  . Number of children: Not on file  . Years of education: Not on file  . Highest education level: Not on file  Occupational History  . Not on file  Social Needs  . Financial resource strain: Not on file  . Food insecurity:    Worry: Not on file    Inability: Not on file  . Transportation needs:    Medical: Not on file    Non-medical: Not  on file  Tobacco Use  . Smoking status: Former Research scientist (life sciences)  . Smokeless tobacco: Never Used  . Tobacco comment: quit 31 years  Substance and Sexual Activity  . Alcohol use: No  . Drug use: No  . Sexual activity: Not on file  Lifestyle  . Physical activity:    Days per week: Not on file    Minutes per session: Not on file  . Stress: Not on file  Relationships  . Social connections:    Talks  on phone: Not on file    Gets together: Not on file    Attends religious service: Not on file    Active member of club or organization: Not on file    Attends meetings of clubs or organizations: Not on file    Relationship status: Not on file  . Intimate partner violence:    Fear of current or ex partner: Not on file    Emotionally abused: Not on file    Physically abused: Not on file    Forced sexual activity: Not on file  Other Topics Concern  . Not on file  Social History Narrative  . Not on file    Review of Systems  Constitutional: Negative for chills, fatigue and unexpected weight change.  HENT: Positive for postnasal drip. Negative for congestion, rhinorrhea, sneezing and sore throat.   Eyes: Negative for redness.  Respiratory: Negative for cough, chest tightness and shortness of breath.   Cardiovascular: Positive for leg swelling. Negative for chest pain and palpitations.  Gastrointestinal: Negative for abdominal pain, constipation, diarrhea, nausea and vomiting.  Genitourinary: Negative for dysuria and frequency.  Musculoskeletal: Positive for arthralgias and joint swelling. Negative for back pain and neck pain.  Skin: Negative for rash.  Neurological: Negative.  Negative for tremors and numbness.  Hematological: Negative for adenopathy. Does not bruise/bleed easily.  Psychiatric/Behavioral: Negative for behavioral problems (Depression), sleep disturbance and suicidal ideas. The patient is not nervous/anxious.     Vital Signs: BP (!) 178/72 (BP Location: Right Arm, Patient  Position: Sitting)   Pulse 74   Resp 16   Ht 5\' 7"  (1.702 m)   Wt 210 lb (95.3 kg)   SpO2 97%   BMI 32.89 kg/m    Physical Exam  Constitutional: She is oriented to person, place, and time. She appears well-developed and well-nourished. No distress.  HENT:  Head: Normocephalic and atraumatic.  Mouth/Throat: Oropharynx is clear and moist. No oropharyngeal exudate.  Eyes: EOM are normal. Pupils are equal, round, and reactive to light.  Neck: Normal range of motion. Neck supple. No JVD present. No tracheal deviation present. No thyromegaly present.  Cardiovascular: Normal rate, regular rhythm and normal heart sounds. Exam reveals no gallop and no friction rub.  No murmur heard. Pulmonary/Chest: Effort normal. No respiratory distress. She has no wheezes. She has no rales. She exhibits no tenderness.  Abdominal: Soft. Bowel sounds are normal.  Musculoskeletal: She exhibits deformity.  Decreased ROM on both knees   Lymphadenopathy:    She has no cervical adenopathy.  Neurological: She is alert and oriented to person, place, and time. No cranial nerve deficit.  Skin: Skin is warm and dry. She is not diaphoretic.  Psychiatric: She has a normal mood and affect. Her behavior is normal. Judgment and thought content normal.   Assessment/Plan: 1. Uncontrolled type 2 diabetes mellitus with hyperglycemia (HCC) - POCT HgB A1C. Slightly elevated since previous visit (8.6). Will improve her diet  - Comprehensive metabolic panel  2. Essential hypertension, benign - Comprehensive metabolic panel - Urinalysis - Microalbumin / creatinine urine ratio - amLODipine (NORVASC) 10 MG tablet; Take 1 tablet (10 mg total) by mouth daily.  Dispense: 90 tablet; Refill: 3 - hydrALAZINE (APRESOLINE) 25 MG tablet; Take 1 tablet (25 mg total) by mouth 2 times daily at 12 noon and 4 pm.  Dispense: 180 tablet; Refill: 3  3. Stress incontinence (female) (female) - oxybutynin (DITROPAN XL) 10 MG 24 hr tablet; Take 1  tablet (10 mg total) by  mouth at bedtime.  Dispense: 90 tablet; Refill: 3  4. Malignant neoplasm of central portion of left breast in female, estrogen receptor positive ([place]) -Per Oncology   5. Iron deficiency anemia, unspecified iron deficiency anemia type - CBC with Differential/Platelet  6. Malignant neoplasm of lower-outer quadrant of left breast of female, estrogen receptor positive (HCC) - RLS rOPINIRole (REQUIP) 0.5 MG tablet; Take 1 tablet (0.5 mg total) by mouth at bedtime.  Dispense: 90 tablet; Refill: 3 - letrozole (FEMARA) 2.5 MG tablet; Take 1 tablet (2.5 mg total) by mouth daily.  Dispense: 90 tablet; Refill: 3  7. Acute pain of right knee - Ambulatory referral to Orthopedic Surgery  8. Chronic atrial fibrillation (HCC) - digoxin (LANOXIN) 0.125 MG tablet; Take 1 tablet (0.125 mg total) by mouth daily.  Dispense: 90 tablet; Refill: 3 - Digoxin level  9. Mixed hyperlipidemia - Lipid Panel With LDL/HDL Ratio - TSH - T4, free - ezetimibe (ZETIA) 10 MG tablet; Take 1 tablet (10 mg total) by mouth daily.  Dispense: 90 tablet; Refill: 3  General Counseling: Gustava verbalizes understanding of the findings of todays visit and agrees with plan of treatment. I have discussed any further diagnostic evaluation that may be needed or ordered today. We also reviewed her medications today. she has been encouraged to call the office with any questions or concerns that should arise related to todays visit.   Orders Placed This Encounter  Procedures  . CBC with Differential/Platelet  . Lipid Panel With LDL/HDL Ratio  . TSH  . T4, free  . Comprehensive metabolic panel  . Urinalysis  . Microalbumin / creatinine urine ratio  . Digoxin level  . Ambulatory referral to Orthopedic Surgery  . POCT HgB A1C    Meds ordered this encounter  Medications  . oxybutynin (DITROPAN XL) 10 MG 24 hr tablet    Sig: Take 1 tablet (10 mg total) by mouth at bedtime.    Dispense:  90 tablet     Refill:  3  . amLODipine (NORVASC) 10 MG tablet    Sig: Take 1 tablet (10 mg total) by mouth daily.    Dispense:  90 tablet    Refill:  3  . rOPINIRole (REQUIP) 0.5 MG tablet    Sig: Take 1 tablet (0.5 mg total) by mouth at bedtime.    Dispense:  90 tablet    Refill:  3  . hydrALAZINE (APRESOLINE) 25 MG tablet    Sig: Take 1 tablet (25 mg total) by mouth 2 times daily at 12 noon and 4 pm.    Dispense:  180 tablet    Refill:  3  . letrozole (FEMARA) 2.5 MG tablet    Sig: Take 1 tablet (2.5 mg total) by mouth daily.    Dispense:  90 tablet    Refill:  3  . digoxin (LANOXIN) 0.125 MG tablet    Sig: Take 1 tablet (0.125 mg total) by mouth daily.    Dispense:  90 tablet    Refill:  3  . ezetimibe (ZETIA) 10 MG tablet    Sig: Take 1 tablet (10 mg total) by mouth daily.    Dispense:  90 tablet    Refill:  3    Time spent:25Minutes    Dr [name] Internal medicine

## 2017-03-12 DIAGNOSIS — M25561 Pain in right knee: Secondary | ICD-10-CM | POA: Diagnosis not present

## 2017-03-12 DIAGNOSIS — M170 Bilateral primary osteoarthritis of knee: Secondary | ICD-10-CM | POA: Diagnosis not present

## 2017-03-12 DIAGNOSIS — M25562 Pain in left knee: Secondary | ICD-10-CM | POA: Diagnosis not present

## 2017-03-15 DIAGNOSIS — E875 Hyperkalemia: Secondary | ICD-10-CM | POA: Diagnosis not present

## 2017-03-26 ENCOUNTER — Telehealth: Payer: Self-pay

## 2017-03-26 NOTE — Telephone Encounter (Signed)
Labs were ordered per [facility] 2/27. I would like to see those results before deciding about her potassium. Which doctor told her the potassium was high? And how high was it?

## 2017-03-27 ENCOUNTER — Other Ambulatory Visit: Payer: Self-pay | Admitting: Nurse Practitioner

## 2017-03-27 ENCOUNTER — Telehealth: Payer: Self-pay

## 2017-03-27 DIAGNOSIS — I10 Essential (primary) hypertension: Secondary | ICD-10-CM

## 2017-03-27 MED ORDER — TELMISARTAN 80 MG PO TABS: 80.0000 mg | ORAL_TABLET | Freq: Every day | ORAL | 3 refills | Status: DC

## 2017-03-27 NOTE — Telephone Encounter (Signed)
Left vm with daughter that pt needed to get with dr that done labs and see what she should do about her potassium medication with her levels being high since he did the lab work.  Nira Conn was informed about the information.  dbs

## 2017-03-27 NOTE — Progress Notes (Signed)
D/c losartan due to recall. Changed to telmisartan 80mg  daily. New rx sent to [facility] [address]

## 2017-03-27 NOTE — Telephone Encounter (Signed)
D/c losartan due to recall. Changed to telmisartan 80mg  daily. New rx sent to [facility] [address].

## 2017-03-27 NOTE — Telephone Encounter (Signed)
Pt daughter called to have pt losartan changed due to recall. Spoke with Leretha Pol and she d/c rx for losartan and sent in an rx for telmisartan 80 mg daily and it was sent to [facility] on [address].

## 2017-04-01 DIAGNOSIS — M170 Bilateral primary osteoarthritis of knee: Secondary | ICD-10-CM | POA: Diagnosis not present

## 2017-04-04 DIAGNOSIS — E1151 Type 2 diabetes mellitus with diabetic peripheral angiopathy without gangrene: Secondary | ICD-10-CM | POA: Diagnosis not present

## 2017-04-04 DIAGNOSIS — I10 Essential (primary) hypertension: Secondary | ICD-10-CM | POA: Diagnosis not present

## 2017-04-04 DIAGNOSIS — C50919 Malignant neoplasm of unspecified site of unspecified female breast: Secondary | ICD-10-CM | POA: Diagnosis not present

## 2017-04-04 DIAGNOSIS — K219 Gastro-esophageal reflux disease without esophagitis: Secondary | ICD-10-CM | POA: Diagnosis not present

## 2017-04-04 DIAGNOSIS — Z794 Long term (current) use of insulin: Secondary | ICD-10-CM | POA: Diagnosis not present

## 2017-04-04 DIAGNOSIS — I4891 Unspecified atrial fibrillation: Secondary | ICD-10-CM | POA: Diagnosis not present

## 2017-04-04 DIAGNOSIS — J449 Chronic obstructive pulmonary disease, unspecified: Secondary | ICD-10-CM | POA: Diagnosis not present

## 2017-04-04 DIAGNOSIS — E669 Obesity, unspecified: Secondary | ICD-10-CM | POA: Diagnosis not present

## 2017-04-04 DIAGNOSIS — E785 Hyperlipidemia, unspecified: Secondary | ICD-10-CM | POA: Diagnosis not present

## 2017-04-04 DIAGNOSIS — E1165 Type 2 diabetes mellitus with hyperglycemia: Secondary | ICD-10-CM | POA: Diagnosis not present

## 2017-04-08 DIAGNOSIS — M1811 Unilateral primary osteoarthritis of first carpometacarpal joint, right hand: Secondary | ICD-10-CM | POA: Diagnosis not present

## 2017-04-08 DIAGNOSIS — M170 Bilateral primary osteoarthritis of knee: Secondary | ICD-10-CM | POA: Diagnosis not present

## 2017-04-09 ENCOUNTER — Encounter: Payer: Self-pay | Admitting: Internal Medicine

## 2017-04-09 ENCOUNTER — Ambulatory Visit (INDEPENDENT_AMBULATORY_CARE_PROVIDER_SITE_OTHER): Payer: Medicare HMO | Admitting: Internal Medicine

## 2017-04-09 VITALS — BP 174/68 | HR 72 | Resp 16 | Ht 67.0 in | Wt 208.0 lb

## 2017-04-09 DIAGNOSIS — M170 Bilateral primary osteoarthritis of knee: Secondary | ICD-10-CM

## 2017-04-09 DIAGNOSIS — I10 Essential (primary) hypertension: Secondary | ICD-10-CM | POA: Diagnosis not present

## 2017-04-09 DIAGNOSIS — E119 Type 2 diabetes mellitus without complications: Secondary | ICD-10-CM | POA: Insufficient documentation

## 2017-04-09 DIAGNOSIS — N393 Stress incontinence (female) (male): Secondary | ICD-10-CM

## 2017-04-09 DIAGNOSIS — R413 Other amnesia: Secondary | ICD-10-CM | POA: Diagnosis not present

## 2017-04-09 DIAGNOSIS — R262 Difficulty in walking, not elsewhere classified: Secondary | ICD-10-CM

## 2017-04-09 DIAGNOSIS — E875 Hyperkalemia: Secondary | ICD-10-CM

## 2017-04-09 DIAGNOSIS — I150 Renovascular hypertension: Secondary | ICD-10-CM | POA: Insufficient documentation

## 2017-04-09 MED ORDER — DONEPEZIL HCL 5 MG PO TABS: ORAL_TABLET | ORAL | 3 refills | Status: DC

## 2017-04-09 NOTE — Progress Notes (Signed)
[facility] [place], [place] [address]  Internal MEDICINE  Office Visit Note  Patient Name: Jennifer Carrillo  [id number]  [id number]  Date of Service: 04/09/2017  Chief Complaint  Patient presents with  . Hypertension  . Hyperkalemia  . Knee Pain  . Urinary Incontinence  . Memory Loss    Other  This is a new (Hyperkalemia ) problem. The current episode started in the past 7 days (Pt was seen by her Oncologist and potassium level was high 5.5. Her Losartan was stopped. pt is on Hydralazine, norvasc and Micardis  ). Associated symptoms include arthralgias and joint swelling. Pertinent negatives include no abdominal pain, chest pain, chills, congestion, coughing, fatigue, nausea, neck pain, numbness, rash, sore throat or vomiting.  Hypertension  This is a recurrent problem. Pertinent negatives include no chest pain, neck pain, palpitations or shortness of breath. Past treatments include ACE inhibitors. The current treatment provides mild improvement.  OA. Pt is here for routine follow up for OA. She was seen by ortho for ongoing knee pain and difficulty walking. She did receive I/A injections with some improvement. Posture is poor. She is home bound, Daughters are in the room and concerned about her memory. Short term memory with repetition of questions and conversations. She will like to start for prevention. Dryness in her mouth due to Ditropan. But symptoms are better   Current Medication: Outpatient Encounter Medications as of 04/09/2017  Medication Sig  . ACCU-CHEK FASTCLIX LANCETS MISC by Does not apply route 3 (three) times daily after meals.  Marland Kitchen amLODipine (NORVASC) 10 MG tablet Take 1 tablet (10 mg total) by mouth daily.  Marland Kitchen aspirin 81 MG tablet Take 81 mg by mouth daily.  . Aspirin-Calcium Carbonate 81-777 MG TABS Take 81 mg by mouth.  . diclofenac sodium (VOLTAREN) 1 % GEL Apply topically.  . digoxin (LANOXIN) 0.125 MG tablet Take 1 tablet (0.125 mg  total) by mouth daily.  Marland Kitchen docusate sodium (COLACE) 100 MG capsule Take 100 mg by mouth daily as needed for mild constipation.  Marland Kitchen donepezil (ARICEPT) 5 MG tablet Take one tab a day for memory  . ergocalciferol (DRISDOL) 50000 UNITS capsule Take 50,000 Units by mouth once a week.  . ezetimibe (ZETIA) 10 MG tablet Take 1 tablet (10 mg total) by mouth daily.  . ferrous sulfate 325 (65 FE) MG tablet Take 325 mg by mouth daily.  Marland Kitchen glucose blood (ACCU-CHEK SMARTVIEW) test strip 3 each by Other route 3 (three) times daily after meals. Use as instructed  . hydrALAZINE (APRESOLINE) 25 MG tablet Take 1 tablet (25 mg total) by mouth 2 times daily at 12 noon and 4 pm.  . insulin aspart protamine- aspart (NOVOLOG MIX 70/30) (70-30) 100 UNIT/ML injection Inject 35 Units into the skin 2 (two) times daily with a meal.  . letrozole (FEMARA) 2.5 MG tablet Take 1 tablet (2.5 mg total) by mouth daily.  Marland Kitchen oxybutynin (DITROPAN XL) 10 MG 24 hr tablet Take 1 tablet (10 mg total) by mouth at bedtime.  . pantoprazole (PROTONIX) 40 MG tablet Take 40 mg by mouth daily.  Marland Kitchen PROAIR HFA 108 (90 Base) MCG/ACT inhaler   . rOPINIRole (REQUIP) 0.5 MG tablet Take 1 tablet (0.5 mg total) by mouth at bedtime.  Marland Kitchen telmisartan (MICARDIS) 80 MG tablet Take 1 tablet (80 mg total) by mouth daily.   No facility-administered encounter medications on file as of 04/09/2017.     Surgical History: Past Surgical History:  Procedure  Laterality Date  . BREAST SURGERY    . CAROTID ENDARTERECTOMY Right   . CATARACT EXTRACTION Right 2013  . CORONARY STENT PLACEMENT     L. L. E.  . STENT PLACEMENT VASCULAR ([place] HX) Left    stent placement on LLE  . TUBAL LIGATION     Medical History: Past Medical History:  Diagnosis Date  . Arthritis   . Asthma   . Calf pain   . COPD (chronic obstructive pulmonary disease) ([place])   . Diabetes ([place])   . Discoid lupus   . Gastro-esophageal reflux   . Glaucoma   . Hyperlipemia   . Hypertension   . Iron  deficiency anemia   . Joint pain   . Leg swelling   . Osteoarthritis   . PVD (peripheral vascular disease) ([place])   . Sinus problem   . Sleep apnea   . Stroke [facility])     Family History: Family History  Problem Relation Age of Onset  . Heart disease Mother   . Diabetes Other   . Hypertension Other   . Diabetes Sister   . Lung cancer Brother   . Leukemia Daughter   . Bladder Cancer Neg Hx   . Kidney disease Neg Hx   . Prostate cancer Neg Hx     Social History   Socioeconomic History  . Marital status: Married    Spouse name: Not on file  . Number of children: Not on file  . Years of education: Not on file  . Highest education level: Not on file  Occupational History  . Not on file  Social Needs  . Financial resource strain: Not on file  . Food insecurity:    Worry: Not on file    Inability: Not on file  . Transportation needs:    Medical: Not on file    Non-medical: Not on file  Tobacco Use  . Smoking status: Former Research scientist (life sciences)  . Smokeless tobacco: Never Used  . Tobacco comment: quit 31 years  Substance and Sexual Activity  . Alcohol use: No  . Drug use: No  . Sexual activity: Not on file  Lifestyle  . Physical activity:    Days per week: Not on file    Minutes per session: Not on file  . Stress: Not on file  Relationships  . Social connections:    Talks on phone: Not on file    Gets together: Not on file    Attends religious service: Not on file    Active member of club or organization: Not on file    Attends meetings of clubs or organizations: Not on file    Relationship status: Not on file  . Intimate partner violence:    Fear of current or ex partner: Not on file    Emotionally abused: Not on file    Physically abused: Not on file    Forced sexual activity: Not on file  Other Topics Concern  . Not on file  Social History Narrative  . Not on file    Review of Systems  Constitutional: Negative for chills, fatigue and unexpected weight change.   HENT: Negative for congestion, postnasal drip, rhinorrhea, sneezing and sore throat.   Eyes: Negative for redness.  Respiratory: Negative for cough, chest tightness and shortness of breath.   Cardiovascular: Negative for chest pain and palpitations.  Gastrointestinal: Negative for abdominal pain, constipation, diarrhea, nausea and vomiting.  Genitourinary: Negative for dysuria and frequency.  Musculoskeletal: Positive for arthralgias and  joint swelling. Negative for back pain and neck pain.  Skin: Negative for rash.  Neurological: Negative.  Negative for tremors and numbness.       Memory issues by family   Hematological: Negative for adenopathy. Does not bruise/bleed easily.  Psychiatric/Behavioral: Negative for behavioral problems (Depression), sleep disturbance and suicidal ideas. The patient is not nervous/anxious.     Vital Signs: BP (!) 174/68 (BP Location: Left Arm, Patient Position: Sitting)   Pulse 72   Resp 16   Ht 5\' 7"  (1.702 m)   Wt 208 lb (94.3 kg)   SpO2 96%   BMI 32.58 kg/m    Physical Exam  Constitutional: She is oriented to person, place, and time. She appears well-developed and well-nourished. No distress.  HENT:  Head: Normocephalic and atraumatic.  Mouth/Throat: Oropharynx is clear and moist. No oropharyngeal exudate.  Eyes: Pupils are equal, round, and reactive to light. EOM are normal.  Neck: Normal range of motion. Neck supple. No JVD present. No tracheal deviation present. No thyromegaly present.  Cardiovascular: Normal rate, regular rhythm and normal heart sounds. Exam reveals no gallop and no friction rub.  No murmur heard. Pulmonary/Chest: Effort normal. No respiratory distress. She has no wheezes. She has no rales. She exhibits no tenderness.  Abdominal: Soft. Bowel sounds are normal.  Musculoskeletal: Normal range of motion. She exhibits deformity.  Gait and posture is poor   Lymphadenopathy:    She has no cervical adenopathy.  Neurological: She  is alert and oriented to person, place, and time. No cranial nerve deficit.  Skin: Skin is warm and dry. She is not diaphoretic.  Psychiatric: She has a normal mood and affect. Her behavior is normal. Judgment and thought content normal.    Assessment/Plan: 1. Hypertension, unspecified type - Recheck potassium level, continue Norvasc, Hydralazine 25 mg bid , will increase to tid and continue Micardis.  BP is elevated, will monitor  2. Primary osteoarthritis of both knees - DME Other see comment - Ambulatory referral to [facility]  3. Difficulty walking due to knee joint - DME Other see comment - Ambulatory referral to [facility]  4. Hyperkalemia - Repeat K   5. Memory deficit - Start  donepezil (ARICEPT) 5 MG tablet; Take one tab a day for memory  Dispense: 90 tablet; Refill: 3  6. Stress incontinence (female) (female) - Dryness due to Ditropan  General Counseling: Idalis verbalizes understanding of the findings of todays visit and agrees with plan of treatment. I have discussed any further diagnostic evaluation that may be needed or ordered today. We also reviewed her medications today. she has been encouraged to call the office with any questions or concerns that should arise related to todays visit.   Orders Placed This Encounter  Procedures  . DME Other see comment  . Ambulatory referral to [facility] ordered this encounter  Medications  . donepezil (ARICEPT) 5 MG tablet    Sig: Take one tab a day for memory    Dispense:  90 tablet    Refill:  3    Time spent: 25  Minutes  Dr [name] Internal medicine

## 2017-04-10 ENCOUNTER — Telehealth: Payer: Self-pay

## 2017-04-10 DIAGNOSIS — E1165 Type 2 diabetes mellitus with hyperglycemia: Secondary | ICD-10-CM | POA: Diagnosis not present

## 2017-04-10 DIAGNOSIS — I10 Essential (primary) hypertension: Secondary | ICD-10-CM | POA: Diagnosis not present

## 2017-04-10 NOTE — Telephone Encounter (Signed)
[facility]axed order [facility] for physical theraphy and  Home  Health nurse eval and treat and also place order in epic and also advised mary([facility] )

## 2017-04-11 ENCOUNTER — Telehealth: Payer: Self-pay

## 2017-04-11 LAB — CBC WITH DIFFERENTIAL/PLATELET
BASOS ABS: 0 10*3/uL (ref 0.0–0.2)
Basos: 0 %
EOS (ABSOLUTE): 0 10*3/uL (ref 0.0–0.4)
Eos: 0 %
HEMOGLOBIN: 10.2 g/dL — AB (ref 11.1–15.9)
Hematocrit: 32.3 % — ABNORMAL LOW (ref 34.0–46.6)
IMMATURE GRANS (ABS): 0 10*3/uL (ref 0.0–0.1)
Immature Granulocytes: 0 %
LYMPHS: 34 %
Lymphocytes Absolute: 3.1 10*3/uL (ref 0.7–3.1)
MCH: 23.6 pg — AB (ref 26.6–33.0)
MCHC: 31.6 g/dL (ref 31.5–35.7)
MCV: 75 fL — ABNORMAL LOW (ref 79–97)
Monocytes Absolute: 0.6 10*3/uL (ref 0.1–0.9)
Monocytes: 7 %
NEUTROS ABS: 5.2 10*3/uL (ref 1.4–7.0)
Neutrophils: 59 %
Platelets: 390 10*3/uL — ABNORMAL HIGH (ref 150–379)
RBC: 4.32 x10E6/uL (ref 3.77–5.28)
RDW: 18.5 % — ABNORMAL HIGH (ref 12.3–15.4)
WBC: 9 10*3/uL (ref 3.4–10.8)

## 2017-04-11 LAB — LIPID PANEL WITH LDL/HDL RATIO
CHOLESTEROL TOTAL: 220 mg/dL — AB (ref 100–199)
HDL: 43 mg/dL (ref >39)
LDL CALC: 136 mg/dL — AB (ref 0–99)
LDl/HDL Ratio: 3.2 ratio (ref 0.0–3.2)
TRIGLYCERIDES: 205 mg/dL — AB (ref 0–149)
VLDL CHOLESTEROL CAL: 41 mg/dL — AB (ref 5–40)

## 2017-04-11 LAB — COMPREHENSIVE METABOLIC PANEL
ALK PHOS: 115 IU/L (ref 39–117)
ALT: 14 IU/L (ref 0–32)
AST: 17 IU/L (ref 0–40)
Albumin/Globulin Ratio: 1 — ABNORMAL LOW (ref 1.2–2.2)
Albumin: 3.5 g/dL (ref 3.5–4.7)
BUN/Creatinine Ratio: 26 (ref 12–28)
BUN: 34 mg/dL — AB (ref 8–27)
CHLORIDE: 109 mmol/L — AB (ref 96–106)
CO2: 17 mmol/L — AB (ref 20–29)
CREATININE: 1.29 mg/dL — AB (ref 0.57–1.00)
Calcium: 9.7 mg/dL (ref 8.7–10.3)
GFR calc Af Amer: 45 mL/min/{1.73_m2} — ABNORMAL LOW (ref >59)
GFR calc non Af Amer: 39 mL/min/{1.73_m2} — ABNORMAL LOW (ref >59)
Globulin, Total: 3.4 g/dL (ref 1.5–4.5)
Glucose: 212 mg/dL — ABNORMAL HIGH (ref 65–99)
Potassium: 4.8 mmol/L (ref 3.5–5.2)
Sodium: 144 mmol/L (ref 134–144)
Total Protein: 6.9 g/dL (ref 6.0–8.5)

## 2017-04-11 LAB — MICROALBUMIN / CREATININE URINE RATIO
CREATININE, UR: 68 mg/dL
Microalb/Creat Ratio: 1618.2 mg/g creat — ABNORMAL HIGH (ref 0.0–30.0)
Microalbumin, Urine: 1100.4 ug/mL

## 2017-04-11 LAB — TSH: TSH: 2.47 u[IU]/mL (ref 0.450–4.500)

## 2017-04-11 LAB — T4, FREE: FREE T4: 0.99 ng/dL (ref 0.82–1.77)

## 2017-04-11 NOTE — Telephone Encounter (Signed)
Gave order for Physical and homehealth order through [facility] to [facility] and manaual paper hand to nick

## 2017-04-15 ENCOUNTER — Other Ambulatory Visit: Payer: Self-pay

## 2017-04-15 ENCOUNTER — Telehealth: Payer: Self-pay

## 2017-04-15 DIAGNOSIS — M170 Bilateral primary osteoarthritis of knee: Secondary | ICD-10-CM | POA: Diagnosis not present

## 2017-04-15 NOTE — Telephone Encounter (Signed)
SPOKE WITH PT ABOUT PHYSICAL THERAPHY AT SHE CANNOT AFFORD IT AS PER [facility] WE GAVE HER OUT PT PHYSICAL THERPHY

## 2017-04-15 NOTE — Telephone Encounter (Signed)
[facility] CALLED BACK SAID PT HAD [facility] COP[facility]  FOR IN PT REFERAL FOR HOME HEATH AND PT SO THEY PREFERRED SHE CAN GO FOR OUT PT REHAB FACILITY AND HE RECOMMENDED [facility] AND GAVE [facility] FOR REFERAL  AND LMOM FOR PT TO CALL us BACK AND DR Humphrey Rolls AWARE

## 2017-04-24 ENCOUNTER — Telehealth: Payer: Self-pay

## 2017-04-24 NOTE — Telephone Encounter (Signed)
Labs are stable, will discuss on next visit

## 2017-04-24 NOTE — Telephone Encounter (Signed)
Advised pt that labs were stable and will discuss at next visit.  dbs

## 2017-04-25 ENCOUNTER — Telehealth: Payer: Self-pay | Admitting: Internal Medicine

## 2017-04-25 NOTE — Telephone Encounter (Signed)
Left message with [facility] p/t dept. Advised them to go forward with p/t order for outpatient therapy.Beth

## 2017-04-29 ENCOUNTER — Other Ambulatory Visit: Payer: Self-pay

## 2017-04-29 DIAGNOSIS — I482 Chronic atrial fibrillation, unspecified: Secondary | ICD-10-CM

## 2017-04-29 MED ORDER — DIGOXIN 125 MCG PO TABS: 0.1250 mg | ORAL_TABLET | Freq: Every day | ORAL | 3 refills | Status: DC

## 2017-07-02 ENCOUNTER — Ambulatory Visit: Payer: Self-pay | Admitting: Internal Medicine

## 2017-07-03 ENCOUNTER — Ambulatory Visit (INDEPENDENT_AMBULATORY_CARE_PROVIDER_SITE_OTHER): Payer: Medicare HMO | Admitting: Internal Medicine

## 2017-07-03 ENCOUNTER — Encounter: Payer: Self-pay | Admitting: Internal Medicine

## 2017-07-03 VITALS — BP 192/78 | HR 78 | Resp 16 | Ht 67.0 in | Wt 209.0 lb

## 2017-07-03 DIAGNOSIS — E1165 Type 2 diabetes mellitus with hyperglycemia: Secondary | ICD-10-CM | POA: Diagnosis not present

## 2017-07-03 DIAGNOSIS — R413 Other amnesia: Secondary | ICD-10-CM | POA: Diagnosis not present

## 2017-07-03 DIAGNOSIS — I10 Essential (primary) hypertension: Secondary | ICD-10-CM

## 2017-07-03 LAB — POCT GLYCOSYLATED HEMOGLOBIN (HGB A1C): Hemoglobin A1C: 8.1 % — AB (ref 4.0–5.6)

## 2017-07-03 MED ORDER — DONEPEZIL HCL 10 MG PO TABS: ORAL_TABLET | ORAL | 3 refills | Status: DC

## 2017-07-03 NOTE — Progress Notes (Signed)
[facility] [place], [place] [address]  Internal MEDICINE  Office Visit Note  Patient Name: Jennifer Carrillo  [id number]  [id number]  Date of Service: 07/03/2017  Chief Complaint  Patient presents with  . Diabetes    4 month follow up   . Hypertension  Pt is here for routine follow up.   Diabetes  She presents for her follow-up diabetic visit. She has type 2 diabetes mellitus. Pertinent negatives for hypoglycemia include no dizziness or headaches. Pertinent negatives for diabetes include no chest pain and no fatigue. Symptoms are worsening. Diabetic complications include nephropathy and PVD. Current diabetic treatment includes insulin injections (Pt taking Novolin 70/30 33-35units BID.  ).  Hypertension  This is a chronic problem. The current episode started more than 1 year ago. The problem has been gradually improving since onset. The problem is controlled. Pertinent negatives include no chest pain, headaches, neck pain, palpitations or shortness of breath. Risk factors for coronary artery disease include diabetes mellitus, dyslipidemia, family history, post-menopausal state, obesity and sedentary lifestyle. The current treatment provides moderate improvement. Hypertensive end-organ damage includes PVD.  Other  This is a new (Pt was started on Aricept on previous visit, Today she is alone and unable to give history ) problem. Pertinent negatives include no abdominal pain, arthralgias, chest pain, chills, coughing, diaphoresis, fatigue, headaches, nausea, neck pain or vomiting.    Current Medication: Outpatient Encounter Medications as of 07/03/2017  Medication Sig  . ACCU-CHEK FASTCLIX LANCETS MISC by Does not apply route 3 (three) times daily after meals.  Marland Kitchen amLODipine (NORVASC) 10 MG tablet Take 1 tablet (10 mg total) by mouth daily.  Marland Kitchen aspirin 81 MG tablet Take 81 mg by mouth daily.  . Aspirin-Calcium Carbonate 81-777 MG TABS Take 81 mg by mouth.  .  digoxin (LANOXIN) 0.125 MG tablet Take 1 tablet (0.125 mg total) by mouth daily.  Marland Kitchen docusate sodium (COLACE) 100 MG capsule Take 100 mg by mouth daily as needed for mild constipation.  Marland Kitchen donepezil (ARICEPT) 5 MG tablet Take one tab a day for memory  . ergocalciferol (DRISDOL) 50000 UNITS capsule Take 50,000 Units by mouth once a week.  . ezetimibe (ZETIA) 10 MG tablet Take 1 tablet (10 mg total) by mouth daily.  Marland Kitchen glucose blood (ACCU-CHEK SMARTVIEW) test strip 3 each by Other route 3 (three) times daily after meals. Use as instructed  . hydrALAZINE (APRESOLINE) 25 MG tablet Take 1 tablet (25 mg total) by mouth 2 times daily at 12 noon and 4 pm.  . insulin aspart protamine- aspart (NOVOLOG MIX 70/30) (70-30) 100 UNIT/ML injection Inject 35 Units into the skin 2 (two) times daily with a meal.  . letrozole (FEMARA) 2.5 MG tablet Take 1 tablet (2.5 mg total) by mouth daily.  Marland Kitchen oxybutynin (DITROPAN XL) 10 MG 24 hr tablet Take 1 tablet (10 mg total) by mouth at bedtime.  . pantoprazole (PROTONIX) 40 MG tablet Take 40 mg by mouth daily.  Marland Kitchen PROAIR HFA 108 (90 Base) MCG/ACT inhaler   . rOPINIRole (REQUIP) 0.5 MG tablet Take 1 tablet (0.5 mg total) by mouth at bedtime.  Marland Kitchen telmisartan (MICARDIS) 80 MG tablet Take 1 tablet (80 mg total) by mouth daily.  Marland Kitchen donepezil (ARICEPT) 10 MG tablet Take one tab po qhs for memory  . ferrous sulfate 325 (65 FE) MG tablet Take 325 mg by mouth daily.   No facility-administered encounter medications on file as of 07/03/2017.     Surgical History:  Past Surgical History:  Procedure Laterality Date  . BREAST SURGERY    . CAROTID ENDARTERECTOMY Right   . CATARACT EXTRACTION Right 2013  . CORONARY STENT PLACEMENT     L. L. E.  . STENT PLACEMENT VASCULAR ([place] HX) Left    stent placement on LLE  . TUBAL LIGATION      Medical History: Past Medical History:  Diagnosis Date  . Arthritis   . Asthma   . Calf pain   . COPD (chronic obstructive pulmonary disease) ([place])    . Diabetes ([place])   . Discoid lupus   . Gastro-esophageal reflux   . Glaucoma   . Hyperlipemia   . Hypertension   . Iron deficiency anemia   . Joint pain   . Leg swelling   . Osteoarthritis   . PVD (peripheral vascular disease) ([place])   . Sinus problem   . Sleep apnea   . Stroke [facility])     Family History: Family History  Problem Relation Age of Onset  . Heart disease Mother   . Diabetes Other   . Hypertension Other   . Diabetes Sister   . Lung cancer Brother   . Leukemia Daughter   . Bladder Cancer Neg Hx   . Kidney disease Neg Hx   . Prostate cancer Neg Hx     Social History   Socioeconomic History  . Marital status: Married    Spouse name: Not on file  . Number of children: Not on file  . Years of education: Not on file  . Highest education level: Not on file  Occupational History  . Not on file  Social Needs  . Financial resource strain: Not on file  . Food insecurity:    Worry: Not on file    Inability: Not on file  . Transportation needs:    Medical: Not on file    Non-medical: Not on file  Tobacco Use  . Smoking status: Former Research scientist (life sciences)  . Smokeless tobacco: Never Used  . Tobacco comment: quit 31 years  Substance and Sexual Activity  . Alcohol use: No  . Drug use: No  . Sexual activity: Not on file  Lifestyle  . Physical activity:    Days per week: Not on file    Minutes per session: Not on file  . Stress: Not on file  Relationships  . Social connections:    Talks on phone: Not on file    Gets together: Not on file    Attends religious service: Not on file    Active member of club or organization: Not on file    Attends meetings of clubs or organizations: Not on file    Relationship status: Not on file  . Intimate partner violence:    Fear of current or ex partner: Not on file    Emotionally abused: Not on file    Physically abused: Not on file    Forced sexual activity: Not on file  Other Topics Concern  . Not on file  Social History  Narrative  . Not on file    Review of Systems  Constitutional: Negative for chills, diaphoresis and fatigue.  HENT: Negative for ear pain, postnasal drip and sinus pressure.   Eyes: Negative for photophobia, discharge, redness, itching and visual disturbance.  Respiratory: Negative for cough, shortness of breath and wheezing.   Cardiovascular: Negative for chest pain, palpitations and leg swelling.  Gastrointestinal: Negative for abdominal pain, constipation, diarrhea, nausea and vomiting.  Genitourinary: Negative  for dysuria and flank pain.  Musculoskeletal: Negative for arthralgias, back pain, gait problem and neck pain.  Skin: Negative for color change.  Allergic/Immunologic: Negative for environmental allergies and food allergies.  Neurological: Negative for dizziness and headaches.  Hematological: Does not bruise/bleed easily.  Psychiatric/Behavioral: Negative for agitation, behavioral problems (depression) and hallucinations.   Vital Signs: BP (!) 192/78 (BP Location: Left Arm, Patient Position: Sitting, Cuff Size: Large)   Pulse 78   Resp 16   Ht 5\' 7"  (1.702 m)   Wt 209 lb (94.8 kg)   SpO2 98%   BMI 32.73 kg/m   Physical Exam  Constitutional: She is oriented to person, place, and time. She appears well-developed and well-nourished. No distress.  HENT:  Head: Normocephalic and atraumatic.  Mouth/Throat: Oropharynx is clear and moist. No oropharyngeal exudate.  Eyes: Pupils are equal, round, and reactive to light. EOM are normal.  Neck: Normal range of motion. Neck supple. No JVD present. No tracheal deviation present. No thyromegaly present.  Cardiovascular: Normal rate, regular rhythm and normal heart sounds. Exam reveals no gallop and no friction rub.  No murmur heard. Pulmonary/Chest: Effort normal. No respiratory distress. She has no wheezes. She has no rales. She exhibits no tenderness.  Abdominal: Soft. Bowel sounds are normal.  Musculoskeletal: Normal range of  motion.  Lymphadenopathy:    She has no cervical adenopathy.  Neurological: She is alert and oriented to person, place, and time. No cranial nerve deficit.  Skin: Skin is warm and dry. She is not diaphoretic.  Psychiatric: She has a normal mood and affect. Her behavior is normal. Judgment and thought content normal.   Assessment/Plan: 1. Uncontrolled type 2 diabetes mellitus with hyperglycemia (HCC) Increase Novolin 70/30 to 36units twice daily.  HgA1C trending down. Continue to log blood sugars.    - POCT HgB A1C   2. Memory deficit Increase Aricept to 10mg  at bedtime.    3. Essential hypertension, benign Continue Hydralazine 25mg  by mouth THREE times daily.  General Counseling: rifky lapre understanding of the findings of todays visit and agrees with plan of treatment. I have discussed any further diagnostic evaluation that may be needed or ordered today. We also reviewed her medications today. she has been encouraged to call the office with any questions or concerns that should arise related to todays visit.    Orders Placed This Encounter  Procedures  . POCT HgB A1C    Meds ordered this encounter  Medications  . donepezil (ARICEPT) 10 MG tablet    Sig: Take one tab po qhs for memory    Dispense:  90 tablet    Refill:  3    Time spent:25 Minutes   Dr [name] Internal medicine

## 2017-07-17 ENCOUNTER — Other Ambulatory Visit: Payer: Self-pay

## 2017-07-17 DIAGNOSIS — I10 Essential (primary) hypertension: Secondary | ICD-10-CM

## 2017-07-17 MED ORDER — TELMISARTAN 80 MG PO TABS: 80.0000 mg | ORAL_TABLET | Freq: Every day | ORAL | 3 refills | Status: DC

## 2017-07-31 DIAGNOSIS — M65332 Trigger finger, left middle finger: Secondary | ICD-10-CM | POA: Diagnosis not present

## 2017-07-31 DIAGNOSIS — C50512 Malignant neoplasm of lower-outer quadrant of left female breast: Secondary | ICD-10-CM | POA: Diagnosis not present

## 2017-07-31 DIAGNOSIS — C50912 Malignant neoplasm of unspecified site of left female breast: Secondary | ICD-10-CM | POA: Diagnosis not present

## 2017-07-31 DIAGNOSIS — Z170 Estrogen receptor positive status [ER+]: Secondary | ICD-10-CM | POA: Diagnosis not present

## 2017-08-02 DIAGNOSIS — K219 Gastro-esophageal reflux disease without esophagitis: Secondary | ICD-10-CM | POA: Diagnosis not present

## 2017-08-02 DIAGNOSIS — R300 Dysuria: Secondary | ICD-10-CM | POA: Diagnosis not present

## 2017-08-02 DIAGNOSIS — C50512 Malignant neoplasm of lower-outer quadrant of left female breast: Secondary | ICD-10-CM | POA: Diagnosis not present

## 2017-08-02 DIAGNOSIS — N182 Chronic kidney disease, stage 2 (mild): Secondary | ICD-10-CM | POA: Diagnosis not present

## 2017-08-02 DIAGNOSIS — Z87891 Personal history of nicotine dependence: Secondary | ICD-10-CM | POA: Diagnosis not present

## 2017-08-02 DIAGNOSIS — Z79811 Long term (current) use of aromatase inhibitors: Secondary | ICD-10-CM | POA: Diagnosis not present

## 2017-08-02 DIAGNOSIS — D649 Anemia, unspecified: Secondary | ICD-10-CM | POA: Diagnosis not present

## 2017-08-02 DIAGNOSIS — Z170 Estrogen receptor positive status [ER+]: Secondary | ICD-10-CM | POA: Diagnosis not present

## 2017-08-02 DIAGNOSIS — E119 Type 2 diabetes mellitus without complications: Secondary | ICD-10-CM | POA: Diagnosis not present

## 2017-08-02 DIAGNOSIS — C50912 Malignant neoplasm of unspecified site of left female breast: Secondary | ICD-10-CM | POA: Diagnosis not present

## 2017-08-02 DIAGNOSIS — Z7982 Long term (current) use of aspirin: Secondary | ICD-10-CM | POA: Diagnosis not present

## 2017-08-02 DIAGNOSIS — Z9012 Acquired absence of left breast and nipple: Secondary | ICD-10-CM | POA: Diagnosis not present

## 2017-08-02 DIAGNOSIS — Z90722 Acquired absence of ovaries, bilateral: Secondary | ICD-10-CM | POA: Diagnosis not present

## 2017-08-02 DIAGNOSIS — I10 Essential (primary) hypertension: Secondary | ICD-10-CM | POA: Diagnosis not present

## 2017-08-20 ENCOUNTER — Telehealth: Payer: Self-pay

## 2017-08-23 ENCOUNTER — Telehealth: Payer: Self-pay | Admitting: Internal Medicine

## 2017-08-23 NOTE — Telephone Encounter (Signed)
Daughter was advised on medication change

## 2017-08-23 NOTE — Telephone Encounter (Signed)
Pt can be seen by Quita Skye with me

## 2017-09-20 DIAGNOSIS — M25552 Pain in left hip: Secondary | ICD-10-CM | POA: Diagnosis not present

## 2017-09-20 DIAGNOSIS — M79604 Pain in right leg: Secondary | ICD-10-CM | POA: Diagnosis not present

## 2017-09-20 DIAGNOSIS — Z9289 Personal history of other medical treatment: Secondary | ICD-10-CM | POA: Diagnosis not present

## 2017-09-20 DIAGNOSIS — M5136 Other intervertebral disc degeneration, lumbar region: Secondary | ICD-10-CM | POA: Diagnosis not present

## 2017-09-20 DIAGNOSIS — I129 Hypertensive chronic kidney disease with stage 1 through stage 4 chronic kidney disease, or unspecified chronic kidney disease: Secondary | ICD-10-CM | POA: Diagnosis not present

## 2017-09-20 DIAGNOSIS — Z90722 Acquired absence of ovaries, bilateral: Secondary | ICD-10-CM | POA: Diagnosis not present

## 2017-09-20 DIAGNOSIS — M1711 Unilateral primary osteoarthritis, right knee: Secondary | ICD-10-CM | POA: Diagnosis not present

## 2017-09-20 DIAGNOSIS — M1611 Unilateral primary osteoarthritis, right hip: Secondary | ICD-10-CM | POA: Diagnosis not present

## 2017-09-20 DIAGNOSIS — E1122 Type 2 diabetes mellitus with diabetic chronic kidney disease: Secondary | ICD-10-CM | POA: Diagnosis not present

## 2017-09-20 DIAGNOSIS — M25561 Pain in right knee: Secondary | ICD-10-CM | POA: Diagnosis not present

## 2017-09-20 DIAGNOSIS — M5137 Other intervertebral disc degeneration, lumbosacral region: Secondary | ICD-10-CM | POA: Diagnosis not present

## 2017-09-20 DIAGNOSIS — Z9012 Acquired absence of left breast and nipple: Secondary | ICD-10-CM | POA: Diagnosis not present

## 2017-09-20 DIAGNOSIS — M25551 Pain in right hip: Secondary | ICD-10-CM | POA: Diagnosis not present

## 2017-09-20 DIAGNOSIS — Z801 Family history of malignant neoplasm of trachea, bronchus and lung: Secondary | ICD-10-CM | POA: Diagnosis not present

## 2017-09-20 DIAGNOSIS — Z87891 Personal history of nicotine dependence: Secondary | ICD-10-CM | POA: Diagnosis not present

## 2017-09-20 DIAGNOSIS — N189 Chronic kidney disease, unspecified: Secondary | ICD-10-CM | POA: Diagnosis not present

## 2017-09-24 DIAGNOSIS — R69 Illness, unspecified: Secondary | ICD-10-CM | POA: Diagnosis not present

## 2017-09-30 DIAGNOSIS — R69 Illness, unspecified: Secondary | ICD-10-CM | POA: Diagnosis not present

## 2017-10-03 ENCOUNTER — Ambulatory Visit (INDEPENDENT_AMBULATORY_CARE_PROVIDER_SITE_OTHER): Payer: Medicare HMO | Admitting: Adult Health

## 2017-10-03 ENCOUNTER — Encounter: Payer: Self-pay | Admitting: Adult Health

## 2017-10-03 VITALS — BP 182/82 | HR 71 | Resp 16 | Ht 67.0 in | Wt 204.8 lb

## 2017-10-03 DIAGNOSIS — E782 Mixed hyperlipidemia: Secondary | ICD-10-CM | POA: Diagnosis not present

## 2017-10-03 DIAGNOSIS — M170 Bilateral primary osteoarthritis of knee: Secondary | ICD-10-CM | POA: Diagnosis not present

## 2017-10-03 DIAGNOSIS — R300 Dysuria: Secondary | ICD-10-CM | POA: Diagnosis not present

## 2017-10-03 DIAGNOSIS — I10 Essential (primary) hypertension: Secondary | ICD-10-CM

## 2017-10-03 DIAGNOSIS — Z0001 Encounter for general adult medical examination with abnormal findings: Secondary | ICD-10-CM | POA: Diagnosis not present

## 2017-10-03 DIAGNOSIS — E1165 Type 2 diabetes mellitus with hyperglycemia: Secondary | ICD-10-CM

## 2017-10-03 DIAGNOSIS — K219 Gastro-esophageal reflux disease without esophagitis: Secondary | ICD-10-CM

## 2017-10-03 DIAGNOSIS — Z23 Encounter for immunization: Secondary | ICD-10-CM | POA: Diagnosis not present

## 2017-10-03 LAB — POCT GLYCOSYLATED HEMOGLOBIN (HGB A1C): HEMOGLOBIN A1C: 7.2 % — AB (ref 4.0–5.6)

## 2017-10-03 NOTE — Progress Notes (Signed)
[facility] [place], [place] [address]  Internal MEDICINE  Office Visit Note  Patient Name: Jennifer Carrillo  [id number]  [id number]  Date of Service: 10/05/2017  Chief Complaint  Patient presents with  . Annual Exam    medicare well visit   . Diabetes  . Hypertension  . Hyperlipidemia    can not take the zetia medications given cramps at night   . Gastroesophageal Reflux  . Quality Metric Gaps    foot exam     HPI Pt is here for routine health maintenance examination. The patient is 84 yo well nourished female.  She has a history of DM, HTN, HLD, GERD.  Her HgA1c is improved at this visit.  It was 8.1 in June, and is now 7.2 today.  She denies issues with her mediations.  Her blood pressure is elevated today.  Pt reports she did not take her medication this morning, and had an argument with her husband on the way to the office.  Her cholesterol is elevated on her last lab work, however she can not take statins, or Zetia, due to cramps.  Pt does not want to try any other medications.  She reports she will take fish oil. She reports good control of her GERD with Protonix.  Quality metric gaps will be addressed, Foot exam.     Current Medication: Outpatient Encounter Medications as of 10/03/2017  Medication Sig  . ACCU-CHEK FASTCLIX LANCETS MISC by Does not apply route 3 (three) times daily after meals.  Marland Kitchen amLODipine (NORVASC) 10 MG tablet Take 1 tablet (10 mg total) by mouth daily.  Marland Kitchen aspirin 81 MG tablet Take 81 mg by mouth daily.  . Aspirin-Calcium Carbonate 81-777 MG TABS Take 81 mg by mouth.  . digoxin (LANOXIN) 0.125 MG tablet Take 1 tablet (0.125 mg total) by mouth daily.  Marland Kitchen docusate sodium (COLACE) 100 MG capsule Take 100 mg by mouth daily as needed for mild constipation.  Marland Kitchen donepezil (ARICEPT) 10 MG tablet Take one tab po qhs for memory  . ergocalciferol (DRISDOL) 50000 UNITS capsule Take 50,000 Units by mouth once a week.  Marland Kitchen glucose blood  (ACCU-CHEK SMARTVIEW) test strip 3 each by Other route 3 (three) times daily after meals. Use as instructed  . hydrALAZINE (APRESOLINE) 25 MG tablet Take 1 tablet (25 mg total) by mouth 2 times daily at 12 noon and 4 pm.  . insulin aspart protamine- aspart (NOVOLOG MIX 70/30) (70-30) 100 UNIT/ML injection Inject 35 Units into the skin 2 (two) times daily with a meal.  . letrozole (FEMARA) 2.5 MG tablet Take 1 tablet (2.5 mg total) by mouth daily.  Marland Kitchen oxybutynin (DITROPAN XL) 10 MG 24 hr tablet Take 1 tablet (10 mg total) by mouth at bedtime.  . pantoprazole (PROTONIX) 40 MG tablet Take 40 mg by mouth daily.  Marland Kitchen PROAIR HFA 108 (90 Base) MCG/ACT inhaler   . rOPINIRole (REQUIP) 0.5 MG tablet Take 1 tablet (0.5 mg total) by mouth at bedtime.  Marland Kitchen telmisartan (MICARDIS) 80 MG tablet Take 1 tablet (80 mg total) by mouth daily.  Marland Kitchen ezetimibe (ZETIA) 10 MG tablet Take 1 tablet (10 mg total) by mouth daily. (Patient not taking: Reported on 10/03/2017)  . ferrous sulfate 325 (65 FE) MG tablet Take 325 mg by mouth daily.   No facility-administered encounter medications on file as of 10/03/2017.     Surgical History: Past Surgical History:  Procedure Laterality Date  . BREAST SURGERY    .  CAROTID ENDARTERECTOMY Right   . CATARACT EXTRACTION Right 2013  . CORONARY STENT PLACEMENT     L. L. E.  . STENT PLACEMENT VASCULAR ([place] HX) Left    stent placement on LLE  . TUBAL LIGATION      Medical History: Past Medical History:  Diagnosis Date  . Arthritis   . Asthma   . Calf pain   . COPD (chronic obstructive pulmonary disease) ([place])   . Diabetes ([place])   . Discoid lupus   . Gastro-esophageal reflux   . Glaucoma   . Hyperlipemia   . Hypertension   . Iron deficiency anemia   . Joint pain   . Leg swelling   . Osteoarthritis   . PVD (peripheral vascular disease) ([place])   . Sinus problem   . Sleep apnea   . Stroke [facility])     Family History: Family History  Problem Relation Age of Onset  . Heart  disease Mother   . Diabetes Other   . Hypertension Other   . Diabetes Sister   . Lung cancer Brother   . Leukemia Daughter   . Bladder Cancer Neg Hx   . Kidney disease Neg Hx   . Prostate cancer Neg Hx       Review of Systems  Constitutional: Negative for chills, fatigue and unexpected weight change.  HENT: Negative for congestion, rhinorrhea, sneezing and sore throat.   Eyes: Negative for photophobia, pain and redness.  Respiratory: Negative for cough, chest tightness and shortness of breath.   Cardiovascular: Negative for chest pain and palpitations.  Gastrointestinal: Negative for abdominal pain, constipation, diarrhea, nausea and vomiting.  Endocrine: Negative.   Genitourinary: Negative for dysuria and frequency.  Musculoskeletal: Negative for arthralgias, back pain, joint swelling and neck pain.  Skin: Negative for rash.  Allergic/Immunologic: Negative.   Neurological: Negative for tremors and numbness.  Hematological: Negative for adenopathy. Does not bruise/bleed easily.  Psychiatric/Behavioral: Negative for behavioral problems and sleep disturbance. The patient is not nervous/anxious.      Vital Signs: BP (!) 182/82   Pulse 71   Resp 16   Ht 5\' 7"  (1.702 m)   Wt 204 lb 12.8 oz (92.9 kg)   SpO2 96%   BMI 32.08 kg/m    Physical Exam  Constitutional: She is oriented to person, place, and time. She appears well-developed and well-nourished. No distress.  HENT:  Head: Normocephalic and atraumatic.  Mouth/Throat: Oropharynx is clear and moist. No oropharyngeal exudate.  Eyes: Pupils are equal, round, and reactive to light. EOM are normal.  Neck: Normal range of motion. Neck supple. No JVD present. No tracheal deviation present. No thyromegaly present.  Cardiovascular: Normal rate, regular rhythm and normal heart sounds. Exam reveals no gallop and no friction rub.  No murmur heard. Pulmonary/Chest: Effort normal and breath sounds normal. No respiratory distress.  She has no wheezes. She has no rales. She exhibits no tenderness.  Abdominal: Soft. There is no tenderness. There is no guarding.  Musculoskeletal: Normal range of motion.  Lymphadenopathy:    She has no cervical adenopathy.  Neurological: She is alert and oriented to person, place, and time. No cranial nerve deficit.  Skin: Skin is warm and dry. She is not diaphoretic.  Psychiatric: She has a normal mood and affect. Her behavior is normal. Judgment and thought content normal.  Nursing note and vitals reviewed.    LABS: Recent Results (from the past 2160 hour(s))  UA/M w/rflx Culture, Routine     Status:  Abnormal   Collection Time: 10/03/17  9:28 AM  Result Value Ref Range   Specific Gravity, UA 1.016 1.005 - 1.030   pH, UA 5.0 5.0 - 7.5   Color, UA Yellow Yellow   Appearance Ur Clear Clear   Leukocytes, UA Negative Negative   Protein, UA 2+ (A) Negative/Trace   Glucose, UA Negative Negative   Ketones, UA Negative Negative   RBC, UA Negative Negative   Bilirubin, UA Negative Negative   Urobilinogen, Ur 0.2 0.2 - 1.0 mg/dL   Nitrite, UA Negative Negative   Microscopic Examination See below:     Comment: Microscopic was indicated and was performed.   Urinalysis Reflex Comment     Comment: This specimen will not reflex to a Urine Culture.  Microscopic Examination     Status: None   Collection Time: 10/03/17  9:28 AM  Result Value Ref Range   WBC, UA 0-5 0 - 5 /hpf   RBC, UA 0-2 0 - 2 /hpf   Epithelial Cells (non renal) 0-10 0 - 10 /hpf   Casts None seen None seen /lpf   Mucus, UA Present Not Estab.   Bacteria, UA Few None seen/Few  POCT HgB A1C     Status: Abnormal   Collection Time: 10/03/17  9:47 AM  Result Value Ref Range   Hemoglobin A1C 7.2 (A) 4.0 - 5.6 %   HbA1c POC (<> result, manual entry)     HbA1c, POC (prediabetic range)     HbA1c, POC (controlled diabetic range)     Depression screen [facility] 2/9 10/03/2017 07/03/2017 03/06/2017 12/20/2016  Decreased Interest 0 0  0 1  Down, Depressed, Hopeless 0 0 0 0  PHQ - 2 Score 0 0 0 1    Functional Status Survey: Is the patient deaf or have difficulty hearing?: Yes Does the patient have difficulty seeing, even when wearing glasses/contacts?: Yes Does the patient have difficulty concentrating, remembering, or making decisions?: Yes Does the patient have difficulty walking or climbing stairs?: Yes Does the patient have difficulty dressing or bathing?: No Does the patient have difficulty doing errands alone such as visiting a doctor's office or shopping?: No  MMSE - [place] Exam 10/03/2017  Orientation to time 5  Orientation to Place 5  Registration 3  Attention/ Calculation 5  Recall 3  Language- name 2 objects 2  Language- repeat 1  Language- follow 3 step command 3  Language- read & follow direction 1  Write a sentence 1  Copy design 1  Total score 30    Fall Risk  10/03/2017 07/03/2017 03/06/2017 12/20/2016  Falls in the past year? No Yes No No  Number falls in past yr: - 1 - -  Injury with Fall? - No - -    Assessment/Plan: 1. Encounter for general adult medical examination with abnormal findings Up to date on PHM. Foot Exam performed today. - Ambulatory referral to Podiatry  2. Uncontrolled type 2 diabetes mellitus with hyperglycemia (HCC) Her current A1C improved to 7.2 from 8.1.  Continue current therapy.  - POCT HgB A1C  3. Essential hypertension, benign Elevated today, pt did not take her medications this morning.   4. Gastroesophageal reflux disease without esophagitis Continue current therapy of Protonix.  5. Mixed hyperlipidemia Pt declined medication.  She has terrible muscle cramps with Statins and zetia.   6. Primary osteoarthritis of both knees Recently went to hospital and had knee aspiration.    7. Flu vaccine need - Flu  Vaccine MDCK QUAD PF  8. Dysuria - UA/M w/rflx Culture, Routine  General Counseling: Markeshia verbalizes understanding of the findings  of todays visit and agrees with plan of treatment. I have discussed any further diagnostic evaluation that may be needed or ordered today. We also reviewed her medications today. she has been encouraged to call the office with any questions or concerns that should arise related to todays visit.   Orders Placed This Encounter  Procedures  . Microscopic Examination  . Flu Vaccine MDCK QUAD PF  . UA/M w/rflx Culture, Routine  . Ambulatory referral to Podiatry  . POCT HgB A1C    Time spent: 30 Minutes   This patient was seen by Orson Gear AGNP-C in Collaboration with Dr [name] as a part of collaborative care agreement   [name], MD  Internal Medicine

## 2017-10-03 NOTE — Patient Instructions (Signed)

## 2017-10-04 LAB — MICROSCOPIC EXAMINATION: Casts: NONE SEEN /lpf

## 2017-10-04 LAB — UA/M W/RFLX CULTURE, ROUTINE
Bilirubin, UA: NEGATIVE
Glucose, UA: NEGATIVE
KETONES UA: NEGATIVE
Leukocytes, UA: NEGATIVE
NITRITE UA: NEGATIVE
PH UA: 5 (ref 5.0–7.5)
RBC, UA: NEGATIVE
Specific Gravity, UA: 1.016 (ref 1.005–1.030)
UUROB: 0.2 mg/dL (ref 0.2–1.0)

## 2017-10-14 ENCOUNTER — Other Ambulatory Visit: Payer: Self-pay | Admitting: Adult Health

## 2017-10-14 DIAGNOSIS — I10 Essential (primary) hypertension: Secondary | ICD-10-CM

## 2017-10-14 MED ORDER — TELMISARTAN 80 MG PO TABS: 80.0000 mg | ORAL_TABLET | Freq: Every day | ORAL | 3 refills | Status: DC

## 2017-10-19 DIAGNOSIS — R69 Illness, unspecified: Secondary | ICD-10-CM | POA: Diagnosis not present

## 2017-11-18 DIAGNOSIS — M2012 Hallux valgus (acquired), left foot: Secondary | ICD-10-CM | POA: Diagnosis not present

## 2017-11-18 DIAGNOSIS — M2042 Other hammer toe(s) (acquired), left foot: Secondary | ICD-10-CM | POA: Diagnosis not present

## 2017-11-18 DIAGNOSIS — B351 Tinea unguium: Secondary | ICD-10-CM | POA: Diagnosis not present

## 2017-11-18 DIAGNOSIS — L851 Acquired keratosis [keratoderma] palmaris et plantaris: Secondary | ICD-10-CM | POA: Diagnosis not present

## 2017-11-18 DIAGNOSIS — Z794 Long term (current) use of insulin: Secondary | ICD-10-CM | POA: Diagnosis not present

## 2017-11-18 DIAGNOSIS — M2011 Hallux valgus (acquired), right foot: Secondary | ICD-10-CM | POA: Diagnosis not present

## 2017-11-18 DIAGNOSIS — M2041 Other hammer toe(s) (acquired), right foot: Secondary | ICD-10-CM | POA: Diagnosis not present

## 2017-11-18 DIAGNOSIS — E1140 Type 2 diabetes mellitus with diabetic neuropathy, unspecified: Secondary | ICD-10-CM | POA: Diagnosis not present

## 2017-11-20 ENCOUNTER — Telehealth: Payer: Self-pay

## 2017-11-20 NOTE — Telephone Encounter (Signed)
Faxed healthcare quality patient assessment form with signed note attached per medicare

## 2017-12-04 ENCOUNTER — Other Ambulatory Visit: Payer: Self-pay | Admitting: Internal Medicine

## 2017-12-04 DIAGNOSIS — I10 Essential (primary) hypertension: Secondary | ICD-10-CM

## 2017-12-13 ENCOUNTER — Ambulatory Visit: Payer: Self-pay | Admitting: Adult Health

## 2017-12-17 ENCOUNTER — Ambulatory Visit (INDEPENDENT_AMBULATORY_CARE_PROVIDER_SITE_OTHER): Payer: Medicare HMO | Admitting: Adult Health

## 2017-12-17 ENCOUNTER — Other Ambulatory Visit: Payer: Self-pay | Admitting: Adult Health

## 2017-12-17 ENCOUNTER — Encounter: Payer: Self-pay | Admitting: Adult Health

## 2017-12-17 VITALS — BP 160/60 | HR 65 | Temp 98.5°F | Resp 16 | Ht 67.0 in | Wt 208.0 lb

## 2017-12-17 DIAGNOSIS — R252 Cramp and spasm: Secondary | ICD-10-CM

## 2017-12-17 DIAGNOSIS — M79661 Pain in right lower leg: Secondary | ICD-10-CM

## 2017-12-17 DIAGNOSIS — R350 Frequency of micturition: Secondary | ICD-10-CM | POA: Diagnosis not present

## 2017-12-17 DIAGNOSIS — M79662 Pain in left lower leg: Principal | ICD-10-CM

## 2017-12-17 DIAGNOSIS — R300 Dysuria: Secondary | ICD-10-CM

## 2017-12-17 LAB — POCT URINALYSIS DIPSTICK
BILIRUBIN UA: NEGATIVE
Glucose, UA: NEGATIVE
KETONES UA: NEGATIVE
Leukocytes, UA: NEGATIVE
NITRITE UA: NEGATIVE
PH UA: 5 (ref 5.0–8.0)
PROTEIN UA: POSITIVE — AB
RBC UA: NEGATIVE
Spec Grav, UA: 1.01 (ref 1.010–1.025)
UROBILINOGEN UA: 0.2 U/dL

## 2017-12-17 NOTE — Patient Instructions (Signed)
Leg Cramps Leg cramps occur when a muscle or muscles tighten and you have no control over this tightening (involuntary muscle contraction). Muscle cramps can develop in any muscle, but the most common place is in the calf muscles of the leg. Those cramps can occur during exercise or when you are at rest. Leg cramps are painful, and they may last for a few seconds to a few minutes. Cramps may return several times before they finally stop. Usually, leg cramps are not caused by a serious medical problem. In many cases, the cause is not known. Some common causes include:  Overexertion.  Overuse from repetitive motions, or doing the same thing over and over.  Remaining in a certain position for a long period of time.  Improper preparation, form, or technique while performing a sport or an activity.  Dehydration.  Injury.  Side effects of some medicines.  Abnormally low levels of the salts and ions in your blood (electrolytes), especially potassium and calcium. These levels could be low if you are taking water pills (diuretics) or if you are pregnant.  Follow these instructions at home: Watch your condition for any changes. Taking the following actions may help to lessen any discomfort that you are feeling:  Stay well-hydrated. Drink enough fluid to keep your urine clear or pale yellow.  Try massaging, stretching, and relaxing the affected muscle. Do this for several minutes at a time.  For tight or tense muscles, use a warm towel, heating pad, or hot shower water directed to the affected area.  If you are sore or have pain after a cramp, applying ice to the affected area may relieve discomfort. ? Put ice in a plastic bag. ? Place a towel between your skin and the bag. ? Leave the ice on for 20 minutes, 2-3 times per day.  Avoid strenuous exercise for several days if you have been having frequent leg cramps.  Make sure that your diet includes the essential minerals for your muscles to  work normally.  Take medicines only as directed by your health care provider.  Contact a health care provider if:  Your leg cramps get more severe or more frequent, or they do not improve over time.  Your foot becomes cold, numb, or blue. This information is not intended to replace advice given to you by your health care provider. Make sure you discuss any questions you have with your health care provider. Document Released: 02/02/2004 Document Revised: 06/02/2015 Document Reviewed: 12/02/2013 Elsevier Interactive Patient Education  2018 [employer]  

## 2017-12-17 NOTE — Progress Notes (Signed)
[facility] [place], [place] [address]  Internal MEDICINE  Office Visit Note  Patient Name: Jennifer Carrillo  [id number]  [id number]  Date of Service: 12/17/2017  Chief Complaint  Patient presents with  . Knee Pain    muscle spasms, in both legs , pain in the back of the leg  . Urinary Frequency     HPI Pt is here for a sick visit.  She is here today complaining of intermittent urinary frequency as well as muscle spasms and leg cramps bilaterally for 2 years.  Patient reports these cramps will come and go and she initially thought it was due to something she was eating.  However she has not been able to pinpoint anything that triggers these cramps.  She reports these cramps mostly happen at night however they do at times happen during the day.    Current Medication:  Outpatient Encounter Medications as of 12/17/2017  Medication Sig  . ACCU-CHEK FASTCLIX LANCETS MISC by Does not apply route 3 (three) times daily after meals.  Mar[place] Kitchen amLODipine (NORVASC) 10 MG tablet Take 1 tablet (10 mg total) by mouth daily.  Marland Kitchen aspirin 81 MG tablet Take 81 mg by mouth daily.  . Aspirin-Calcium Carbonate 81-777 MG TABS Take 81 mg by mouth.  . digoxin (LANOXIN) 0.125 MG tablet Take 1 tablet (0.125 mg total) by mouth daily.  Marland Kitchen docusate sodium (COLACE) 100 MG capsule Take 100 mg by mouth daily as needed for mild constipation.  Marland Kitchen donepezil (ARICEPT) 10 MG tablet Take one tab po qhs for memory  . ergocalciferol (DRISDOL) 50000 UNITS capsule Take 50,000 Units by mouth once a week.  . ezetimibe (ZETIA) 10 MG tablet Take 1 tablet (10 mg total) by mouth daily.  Marland Kitchen glucose blood (ACCU-CHEK SMARTVIEW) test strip 3 each by Other route 3 (three) times daily after meals. Use as instructed  . hydrALAZINE (APRESOLINE) 25 MG tablet TAKE 1 TABLET BY MOUTH TWICE A DAY  . insulin aspart protamine- aspart (NOVOLOG MIX 70/30) (70-30) 100 UNIT/ML injection Inject 35 Units into the skin 2 (two)  times daily with a meal.  . letrozole (FEMARA) 2.5 MG tablet Take 1 tablet (2.5 mg total) by mouth daily.  Marland Kitchen oxybutynin (DITROPAN XL) 10 MG 24 hr tablet Take 1 tablet (10 mg total) by mouth at bedtime.  . pantoprazole (PROTONIX) 40 MG tablet Take 40 mg by mouth daily.  Marland Kitchen PROAIR HFA 108 (90 Base) MCG/ACT inhaler   . rOPINIRole (REQUIP) 0.5 MG tablet Take 1 tablet (0.5 mg total) by mouth at bedtime.  Marland Kitchen telmisartan (MICARDIS) 80 MG tablet Take 1 tablet (80 mg total) by mouth daily.  . [DISCONTINUED] telmisartan (MICARDIS) 80 MG tablet TAKE 1 TABLET BY MOUTH EVERY DAY (D/C LOSARTAN DUE TO RECALL)  . ferrous sulfate 325 (65 FE) MG tablet Take 325 mg by mouth daily.   No facility-administered encounter medications on file as of 12/17/2017.       Medical History: Past Medical History:  Diagnosis Date  . Arthritis   . Asthma   . Calf pain   . COPD (chronic obstructive pulmonary disease) ([place])   . Diabetes ([place])   . Discoid lupus   . Gastro-esophageal reflux   . Glaucoma   . Hyperlipemia   . Hypertension   . Iron deficiency anemia   . Joint pain   . Leg swelling   . Osteoarthritis   . PVD (peripheral vascular disease) ([place])   . Sinus problem   .  Sleep apnea   . Stroke ([place])      Vital Signs: BP (!) 160/60 (BP Location: Left Arm, Patient Position: Sitting, Cuff Size: Normal)   Pulse 65   Temp 98.5 F (36.9 C) (Oral)   Resp 16   Ht 5\' 7"  (1.702 m)   Wt 208 lb (94.3 kg)   SpO2 99%   BMI 32.58 kg/m    Review of Systems  Genitourinary: Positive for frequency.  Musculoskeletal:       Bilateral leg cramps.muscle spasms.    Physical Exam  Constitutional: She is oriented to person, place, and time. She appears well-developed and well-nourished. No distress.  HENT:  Head: Normocephalic and atraumatic.  Mouth/Throat: Oropharynx is clear and moist. No oropharyngeal exudate.  Eyes: Pupils are equal, round, and reactive to light. EOM are normal.  Neck: Normal range of motion.  Neck supple. No JVD present. No tracheal deviation present. No thyromegaly present.  Cardiovascular: Normal rate, regular rhythm and normal heart sounds. Exam reveals no gallop and no friction rub.  No murmur heard. Pulmonary/Chest: Effort normal and breath sounds normal. No respiratory distress. She has no wheezes. She has no rales. She exhibits no tenderness.  Abdominal: Soft. There is no tenderness. There is no guarding.  Musculoskeletal: Normal range of motion.  Lymphadenopathy:    She has no cervical adenopathy.  Neurological: She is alert and oriented to person, place, and time. No cranial nerve deficit.  Skin: Skin is warm and dry. She is not diaphoretic.  Psychiatric: She has a normal mood and affect. Her behavior is normal. Judgment and thought content normal.  Nursing note and vitals reviewed.   Assessment/Plan: 1. Bilateral leg cramps Will have lab work obtained to check patient's electrolytes as well as metabolic panel.  Also will order bilateral lower extremity ultrasounds to rule out vascular issues. - VAS [place] LOWER EXTREMITY VENOUS (DVT); Future - Comprehensive metabolic panel - Magnesium  2. Urinary frequency Patient's urine was clear instructed her to call or return to clinic if symptoms persist.  She cannot member the last time she had urinary frequency just wanted to mention it since she was there.  3. Dysuria Urine dip negative. - POCT Urinalysis Dipstick  General Counseling: Jennifer Carrillo verbalizes understanding of the findings of todays visit and agrees with plan of treatment. I have discussed any further diagnostic evaluation that may be needed or ordered today. We also reviewed her medications today. she has been encouraged to call the office with any questions or concerns that should arise related to todays visit.   Orders Placed This Encounter  Procedures  . Comprehensive metabolic panel  . Magnesium  . POCT Urinalysis Dipstick    No orders of the defined  types were placed in this encounter.   Time spent: 25 Minutes  This patient was seen by Orson Gear AGNP-C in Collaboration with Dr [name] as a part of collaborative care agreement.  Kendell Bane AGNP-C Internal Medicine

## 2017-12-18 LAB — MAGNESIUM: Magnesium: 2 mg/dL (ref 1.6–2.3)

## 2017-12-18 LAB — COMPREHENSIVE METABOLIC PANEL
A/G RATIO: 1.3 (ref 1.2–2.2)
ALK PHOS: 95 IU/L (ref 39–117)
ALT: 8 IU/L (ref 0–32)
AST: 13 IU/L (ref 0–40)
Albumin: 3.4 g/dL — ABNORMAL LOW (ref 3.5–4.7)
BILIRUBIN TOTAL: 0.2 mg/dL (ref 0.0–1.2)
BUN/Creatinine Ratio: 21 (ref 12–28)
BUN: 31 mg/dL — AB (ref 8–27)
CHLORIDE: 108 mmol/L — AB (ref 96–106)
CO2: 21 mmol/L (ref 20–29)
CREATININE: 1.46 mg/dL — AB (ref 0.57–1.00)
Calcium: 9.4 mg/dL (ref 8.7–10.3)
GFR, EST AFRICAN AMERICAN: 38 mL/min/{1.73_m2} — AB (ref >59)
GFR, EST NON AFRICAN AMERICAN: 33 mL/min/{1.73_m2} — AB (ref >59)
GLUCOSE: 150 mg/dL — AB (ref 65–99)
Globulin, Total: 2.7 g/dL (ref 1.5–4.5)
Potassium: 4.9 mmol/L (ref 3.5–5.2)
Sodium: 141 mmol/L (ref 134–144)
TOTAL PROTEIN: 6.1 g/dL (ref 6.0–8.5)

## 2017-12-27 ENCOUNTER — Ambulatory Visit: Payer: Medicare HMO

## 2017-12-27 DIAGNOSIS — R252 Cramp and spasm: Secondary | ICD-10-CM

## 2017-12-27 DIAGNOSIS — M79662 Pain in left lower leg: Principal | ICD-10-CM

## 2017-12-27 DIAGNOSIS — M79661 Pain in right lower leg: Secondary | ICD-10-CM

## 2018-01-05 ENCOUNTER — Other Ambulatory Visit: Payer: Self-pay | Admitting: Adult Health

## 2018-01-05 DIAGNOSIS — I10 Essential (primary) hypertension: Secondary | ICD-10-CM

## 2018-01-09 ENCOUNTER — Encounter: Payer: Self-pay | Admitting: Adult Health

## 2018-01-09 ENCOUNTER — Ambulatory Visit (INDEPENDENT_AMBULATORY_CARE_PROVIDER_SITE_OTHER): Payer: Medicare Other | Admitting: Adult Health

## 2018-01-09 VITALS — BP 199/67 | HR 75 | Resp 16 | Ht 67.0 in | Wt 211.3 lb

## 2018-01-09 DIAGNOSIS — I70203 Unspecified atherosclerosis of native arteries of extremities, bilateral legs: Secondary | ICD-10-CM | POA: Diagnosis not present

## 2018-01-09 DIAGNOSIS — R0609 Other forms of dyspnea: Secondary | ICD-10-CM | POA: Diagnosis not present

## 2018-01-09 DIAGNOSIS — R7989 Other specified abnormal findings of blood chemistry: Secondary | ICD-10-CM

## 2018-01-09 DIAGNOSIS — R0600 Dyspnea, unspecified: Secondary | ICD-10-CM

## 2018-01-09 NOTE — Progress Notes (Signed)
[facility] [place], [place] [address]  Internal MEDICINE  Office Visit Note  Jennifer Carrillo Name: Jennifer Carrillo  [id number]  [id number]  Date of Service: 01/09/2018  Chief Complaint  Jennifer Carrillo presents with  . Follow-up    labs and ultrasound    HPI Jennifer Carrillo is here for follow-up on labs and lower extremity ultrasound.  Jennifer Carrillo continues to report bilateral lower extremity pain with walking.  Jennifer Carrillo had bilateral lower extremity ultrasound.  The ultrasound showed bilateral femoral artery stenosis.  As well as, bilateral moderate arterial insufficiency.  During discussion about lab results it is noted that Jennifer Carrillo's creatinine has increased.  Will have Jennifer Carrillo follow-up with nephrology.  Jennifer Carrillo reports that Jennifer Carrillo is seen nephrology however was released from their care years ago.  Will refer her at this time to reestablish care to monitor ongoing lab work.  Jennifer Carrillo is also reporting some shortness of breath with exertion.  Jennifer Carrillo reports that just walking in from the car into the office Jennifer Carrillo feels like Jennifer Carrillo needs to sit down.  Jennifer Carrillo states that Jennifer Carrillo breathes heavy and at some point gets lightheaded.   Current Medication: Outpatient Encounter Medications as of 01/09/2018  Medication Sig  . ACCU-CHEK FASTCLIX LANCETS MISC by Does not apply route 3 (three) times daily after meals.  Marland Kitchen amLODipine (NORVASC) 10 MG tablet Take 1 tablet (10 mg total) by mouth daily.  Marland Kitchen aspirin 81 MG tablet Take 81 mg by mouth daily.  . Aspirin-Calcium Carbonate 81-777 MG TABS Take 81 mg by mouth.  . digoxin (LANOXIN) 0.125 MG tablet Take 1 tablet (0.125 mg total) by mouth daily.  Marland Kitchen docusate sodium (COLACE) 100 MG capsule Take 100 mg by mouth daily as needed for mild constipation.  Marland Kitchen donepezil (ARICEPT) 10 MG tablet Take one tab po qhs for memory  . ergocalciferol (DRISDOL) 50000 UNITS capsule Take 50,000 Units by mouth once a week.  . ezetimibe (ZETIA) 10 MG tablet Take 1 tablet (10 mg total) by mouth daily.  Marland Kitchen  glucose blood (ACCU-CHEK SMARTVIEW) test strip 3 each by Other route 3 (three) times daily after meals. Use as instructed  . hydrALAZINE (APRESOLINE) 25 MG tablet TAKE 1 TABLET BY MOUTH TWICE A DAY  . insulin aspart protamine- aspart (NOVOLOG MIX 70/30) (70-30) 100 UNIT/ML injection Inject 35 Units into the skin 2 (two) times daily with a meal.  . letrozole (FEMARA) 2.5 MG tablet Take 1 tablet (2.5 mg total) by mouth daily.  Marland Kitchen oxybutynin (DITROPAN XL) 10 MG 24 hr tablet Take 1 tablet (10 mg total) by mouth at bedtime.  . pantoprazole (PROTONIX) 40 MG tablet Take 40 mg by mouth daily.  Marland Kitchen PROAIR HFA 108 (90 Base) MCG/ACT inhaler   . rOPINIRole (REQUIP) 0.5 MG tablet Take 1 tablet (0.5 mg total) by mouth at bedtime.  Marland Kitchen telmisartan (MICARDIS) 80 MG tablet TAKE 1 TABLET BY MOUTH EVERY DAY  . ferrous sulfate 325 (65 FE) MG tablet Take 325 mg by mouth daily.   No facility-administered encounter medications on file as of 01/09/2018.     Surgical History: Past Surgical History:  Procedure Laterality Date  . BREAST SURGERY    . CAROTID ENDARTERECTOMY Right   . CATARACT EXTRACTION Right 2013  . CORONARY STENT PLACEMENT     L. L. E.  . STENT PLACEMENT VASCULAR ([place] [facility]) Left    stent placement on LLE  . TUBAL LIGATION      Medical History: Past Medical History:  Diagnosis Date  .  Arthritis   . Asthma   . Calf pain   . COPD (chronic obstructive pulmonary disease) ([place])   . Diabetes ([place])   . Discoid lupus   . Gastro-esophageal reflux   . Glaucoma   . Hyperlipemia   . Hypertension   . Iron deficiency anemia   . Joint pain   . Leg swelling   . Osteoarthritis   . PVD (peripheral vascular disease) ([place])   . Sinus problem   . Sleep apnea   . Stroke [facility])     Family History: Family History  Problem Relation Age of Onset  . Heart disease Mother   . Diabetes Other   . Hypertension Other   . Diabetes Sister   . Lung cancer Brother   . Leukemia Daughter   . Bladder Cancer Neg Hx    . Kidney disease Neg Hx   . Prostate cancer Neg Hx     Social History   Socioeconomic History  . Marital status: Married    Spouse name: Not on file  . Number of children: Not on file  . Years of education: Not on file  . Highest education level: Not on file  Occupational History  . Not on file  Social Needs  . Financial resource strain: Not on file  . Food insecurity:    Worry: Not on file    Inability: Not on file  . Transportation needs:    Medical: Not on file    Non-medical: Not on file  Tobacco Use  . Smoking status: Former Research scientist (life sciences)  . Smokeless tobacco: Never Used  . Tobacco comment: quit 31 years  Substance and Sexual Activity  . Alcohol use: No  . Drug use: No  . Sexual activity: Not on file  Lifestyle  . Physical activity:    Days per week: Not on file    Minutes per session: Not on file  . Stress: Not on file  Relationships  . Social connections:    Talks on phone: Not on file    Gets together: Not on file    Attends religious service: Not on file    Active member of club or organization: Not on file    Attends meetings of clubs or organizations: Not on file    Relationship status: Not on file  . Intimate partner violence:    Fear of current or ex partner: Not on file    Emotionally abused: Not on file    Physically abused: Not on file    Forced sexual activity: Not on file  Other Topics Concern  . Not on file  Social History Narrative  . Not on file      Review of Systems  Constitutional: Negative for chills, fatigue and unexpected weight change.  HENT: Negative for congestion, rhinorrhea, sneezing and sore throat.   Eyes: Negative for photophobia, pain and redness.  Respiratory: Negative for cough, chest tightness and shortness of breath.   Cardiovascular: Negative for chest pain and palpitations.  Gastrointestinal: Negative for abdominal pain, constipation, diarrhea, nausea and vomiting.  Endocrine: Negative.   Genitourinary: Negative for  dysuria and frequency.  Musculoskeletal: Negative for arthralgias, back pain, joint swelling and neck pain.       Continued lower extremity pain with walking  Skin: Negative for rash.  Allergic/Immunologic: Negative.   Neurological: Negative for tremors and numbness.  Hematological: Negative for adenopathy. Does not bruise/bleed easily.  Psychiatric/Behavioral: Negative for behavioral problems and sleep disturbance. The Jennifer Carrillo is not nervous/anxious.  Vital Signs: BP (!) 199/67   Pulse 75   Resp 16   Ht 5\' 7"  (1.702 m)   Wt 211 lb 4.8 oz (95.8 kg)   SpO2 96%   BMI 33.09 kg/m    Physical Exam Vitals signs and nursing note reviewed.  Constitutional:      General: Jennifer Carrillo is not in acute distress.    Appearance: Jennifer Carrillo is well-developed. Jennifer Carrillo is not diaphoretic.  HENT:     Head: Normocephalic and atraumatic.     Mouth/Throat:     Pharynx: No oropharyngeal exudate.  Eyes:     Pupils: Pupils are equal, round, and reactive to light.  Neck:     Musculoskeletal: Normal range of motion and neck supple.     Thyroid: No thyromegaly.     Vascular: No JVD.     Trachea: No tracheal deviation.  Cardiovascular:     Rate and Rhythm: Normal rate and regular rhythm.     Heart sounds: Normal heart sounds. No murmur. No friction rub. No gallop.   Pulmonary:     Effort: Pulmonary effort is normal. No respiratory distress.     Breath sounds: Normal breath sounds. No wheezing or rales.  Chest:     Chest wall: No tenderness.  Abdominal:     Palpations: Abdomen is soft.     Tenderness: There is no abdominal tenderness. There is no guarding.  Musculoskeletal: Normal range of motion.  Lymphadenopathy:     Cervical: No cervical adenopathy.  Skin:    General: Skin is warm and dry.  Neurological:     Mental Status: Jennifer Carrillo is alert and oriented to person, place, and time.     Cranial Nerves: No cranial nerve deficit.  Psychiatric:        Behavior: Behavior normal.        Thought Content: Thought  content normal.        Judgment: Judgment normal.    Assessment/Plan: 1. Elevated serum creatinine Referral for nephrology to establish care and follow Jennifer Carrillo's BUN and creatinine. - Ambulatory referral to Nephrology  2. DOE (dyspnea on exertion) Echocardiogram ordered for shortness of breath and dyspnea on exertion. - ECHOCARDIOGRAM COMPLETE; Future  3. Bilateral femoral artery stenosis (HCC) Due to Jennifer Carrillo's femoral artery stenosis and arterial insufficiency will refer to vascular for care establishment and follow-up. - Ambulatory referral to Vascular Surgery  General Counseling: johnnae impastato understanding of the findings of todays visit and agrees with plan of treatment. I have discussed any further diagnostic evaluation that may be needed or ordered today. We also reviewed her medications today. Jennifer Carrillo has been encouraged to call the office with any questions or concerns that should arise related to todays visit.    Orders Placed This Encounter  Procedures  . Ambulatory referral to Nephrology  . Ambulatory referral to Vascular Surgery  . ECHOCARDIOGRAM COMPLETE    No orders of the defined types were placed in this encounter.   Time spent: 25 Minutes   This Jennifer Carrillo was seen by Orson Gear AGNP-C in Collaboration with Dr [name] as a part of collaborative care agreement     Kendell Bane AGNP-C Internal medicine

## 2018-01-09 NOTE — Patient Instructions (Signed)
Echocardiogram An echocardiogram is a procedure that uses painless sound waves (ultrasound) to produce an image of the heart. Images from an echocardiogram can provide important information about:  Signs of coronary artery disease (CAD).  Aneurysm detection. An aneurysm is a weak or damaged part of an artery wall that bulges out from the normal force of blood pumping through the body.  Heart size and shape. Changes in the size or shape of the heart can be associated with certain conditions, including heart failure, aneurysm, and CAD.  Heart muscle function.  Heart valve function.  Signs of a past heart attack.  Fluid buildup around the heart.  Thickening of the heart muscle.  A tumor or infectious growth around the heart valves. Tell a health care provider about:  Any allergies you have.  All medicines you are taking, including vitamins, herbs, eye drops, creams, and over-the-counter medicines.  Any blood disorders you have.  Any surgeries you have had.  Any medical conditions you have.  Whether you are pregnant or may be pregnant. What are the risks? Generally, this is a safe procedure. However, problems may occur, including:  Allergic reaction to dye (contrast) that may be used during the procedure. What happens before the procedure? No specific preparation is needed. You may eat and drink normally. What happens during the procedure?   An IV tube may be inserted into one of your veins.  You may receive contrast through this tube. A contrast is an injection that improves the quality of the pictures from your heart.  A gel will be applied to your chest.  A wand-like tool (transducer) will be moved over your chest. The gel will help to transmit the sound waves from the transducer.  The sound waves will harmlessly bounce off of your heart to allow the heart images to be captured in real-time motion. The images will be recorded on a computer. The procedure may vary  among health care providers and hospitals. What happens after the procedure?  You may return to your normal, everyday life, including diet, activities, and medicines, unless your health care provider tells you not to do that. Summary  An echocardiogram is a procedure that uses painless sound waves (ultrasound) to produce an image of the heart.  Images from an echocardiogram can provide important information about the size and shape of your heart, heart muscle function, heart valve function, and fluid buildup around your heart.  You do not need to do anything to prepare before this procedure. You may eat and drink normally.  After the echocardiogram is completed, you may return to your normal, everyday life, unless your health care provider tells you not to do that. This information is not intended to replace advice given to you by your health care provider. Make sure you discuss any questions you have with your health care provider. Document Released: 12/23/1999 Document Revised: 01/28/2016 Document Reviewed: 01/28/2016 Elsevier Interactive Patient Education  2019 [employer].

## 2018-01-13 ENCOUNTER — Encounter (INDEPENDENT_AMBULATORY_CARE_PROVIDER_SITE_OTHER): Payer: Self-pay | Admitting: Vascular Surgery

## 2018-01-13 ENCOUNTER — Ambulatory Visit (INDEPENDENT_AMBULATORY_CARE_PROVIDER_SITE_OTHER): Payer: Medicare Other | Admitting: Vascular Surgery

## 2018-01-13 VITALS — BP 202/75 | HR 87 | Resp 16 | Ht 67.0 in | Wt 201.6 lb

## 2018-01-13 DIAGNOSIS — M79606 Pain in leg, unspecified: Secondary | ICD-10-CM | POA: Insufficient documentation

## 2018-01-13 DIAGNOSIS — M79605 Pain in left leg: Secondary | ICD-10-CM

## 2018-01-13 DIAGNOSIS — M79604 Pain in right leg: Secondary | ICD-10-CM

## 2018-01-13 DIAGNOSIS — E119 Type 2 diabetes mellitus without complications: Secondary | ICD-10-CM | POA: Diagnosis not present

## 2018-01-13 DIAGNOSIS — I10 Essential (primary) hypertension: Secondary | ICD-10-CM

## 2018-01-13 DIAGNOSIS — I739 Peripheral vascular disease, unspecified: Secondary | ICD-10-CM | POA: Insufficient documentation

## 2018-01-13 DIAGNOSIS — Z87891 Personal history of nicotine dependence: Secondary | ICD-10-CM

## 2018-01-13 DIAGNOSIS — R0989 Other specified symptoms and signs involving the circulatory and respiratory systems: Secondary | ICD-10-CM | POA: Diagnosis not present

## 2018-01-13 NOTE — Progress Notes (Signed)
MRN : [id number]  Jennifer Carrillo is a 85 y.o. (1933-02-09) female who presents with chief complaint of  Chief Complaint  Patient presents with  . New Patient (Initial Visit)  .  History of Present Illness:  The patient is seen for evaluation of painful lower extremities. Patient notes the pain is variable and not always associated with activity.  The pain is somewhat consistent day to day occurring on most days. The patient notes the pain also occurs with standing and routinely seems worse as the day wears on. The pain has been progressive over the past several years. The patient states these symptoms are causing  a profound negative impact on quality of life and daily activities.  Location: both legs, right more so than left Character: cramping charlie horse Severity: she states 10/10 Duration:  Episodic mostly during the night but makes it "impossible to walk" Time: pretty much every day Context: just happens Modifying:  She notes that getting up at night to urinate seems to make it better  The patient denies rest pain or dangling of an extremity off the side of the bed during the night for relief. No open wounds or sores at this time. No history of DVT or phlebitis. No prior interventions or surgeries.  There is a  history of back problems and DJD of the lumbar and sacral spine.    Current Meds  Medication Sig  . ACCU-CHEK FASTCLIX LANCETS MISC by Does not apply route 3 (three) times daily after meals.  Marland Kitchen amLODipine (NORVASC) 10 MG tablet Take 1 tablet (10 mg total) by mouth daily.  Marland Kitchen aspirin 81 MG tablet Take 81 mg by mouth daily.  . digoxin (LANOXIN) 0.125 MG tablet Take 1 tablet (0.125 mg total) by mouth daily.  Marland Kitchen docusate sodium (COLACE) 100 MG capsule Take 100 mg by mouth daily as needed for mild constipation.  Marland Kitchen donepezil (ARICEPT) 10 MG tablet Take one tab po qhs for memory  . ergocalciferol (DRISDOL) 50000 UNITS capsule Take 50,000 Units by mouth once a week.  .  ferrous sulfate 325 (65 FE) MG tablet Take 325 mg by mouth daily.  Marland Kitchen glucose blood (ACCU-CHEK SMARTVIEW) test strip 3 each by Other route 3 (three) times daily after meals. Use as instructed  . hydrALAZINE (APRESOLINE) 25 MG tablet TAKE 1 TABLET BY MOUTH TWICE A DAY  . insulin aspart protamine- aspart (NOVOLOG MIX 70/30) (70-30) 100 UNIT/ML injection Inject 35 Units into the skin 2 (two) times daily with a meal.  . pantoprazole (PROTONIX) 40 MG tablet Take 40 mg by mouth daily.  Marland Kitchen PROAIR HFA 108 (90 Base) MCG/ACT inhaler   . rOPINIRole (REQUIP) 0.5 MG tablet Take 1 tablet (0.5 mg total) by mouth at bedtime.  Marland Kitchen telmisartan (MICARDIS) 80 MG tablet TAKE 1 TABLET BY MOUTH EVERY DAY    Past Medical History:  Diagnosis Date  . Arthritis   . Asthma   . Calf pain   . COPD (chronic obstructive pulmonary disease) ([place])   . Diabetes ([place])   . Discoid lupus   . Gastro-esophageal reflux   . Glaucoma   . Hyperlipemia   . Hypertension   . Iron deficiency anemia   . Joint pain   . Leg swelling   . Osteoarthritis   . PVD (peripheral vascular disease) ([place])   . Sinus problem   . Sleep apnea   . Stroke [facility])     Past Surgical History:  Procedure Laterality Date  . BREAST  SURGERY    . CAROTID ENDARTERECTOMY Right   . CATARACT EXTRACTION Right 2013  . CORONARY STENT PLACEMENT     L. L. E.  . STENT PLACEMENT VASCULAR ([place] HX) Left    stent placement on LLE  . TUBAL LIGATION      Social History Social History   Tobacco Use  . Smoking status: Former Research scientist (life sciences)  . Smokeless tobacco: Never Used  . Tobacco comment: quit 31 years  Substance Use Topics  . Alcohol use: No  . Drug use: No    Family History Family History  Problem Relation Age of Onset  . Heart disease Mother   . Diabetes Other   . Hypertension Other   . Diabetes Sister   . Lung cancer Brother   . Leukemia Daughter   . Bladder Cancer Neg Hx   . Kidney disease Neg Hx   . Prostate cancer Neg Hx   No family history of  bleeding/clotting disorders, porphyria or autoimmune disease   Allergies  Allergen Reactions  . Benazepril Other (See Comments)  . Statins      REVIEW OF SYSTEMS (Negative unless checked)  Constitutional: [] Weight loss  [] Fever  [] Chills Cardiac: [] Chest pain   [] Chest pressure   [] Palpitations   [] Shortness of breath when laying flat   [] Shortness of breath with exertion. Vascular:  [x] Pain in legs with walking   [x] Pain in legs at rest  [] History of DVT   [] Phlebitis   [] Swelling in legs   [] Varicose veins   [] Non-healing ulcers Pulmonary:   [] Uses home oxygen   [] Productive cough   [] Hemoptysis   [] Wheeze  [] COPD   [] Asthma Neurologic:  [] Dizziness   [] Seizures   [] History of stroke   [] History of TIA  [] Aphasia   [] Vissual changes   [] Weakness or numbness in arm   [x] Weakness or numbness in leg Musculoskeletal:   [x] Joint swelling   [] Joint pain   [] Low back pain Hematologic:  [] Easy bruising  [] Easy bleeding   [] Hypercoagulable state   [] Anemic Gastrointestinal:  [] Diarrhea   [] Vomiting  [] Gastroesophageal reflux/heartburn   [] Difficulty swallowing. Genitourinary:  [] Chronic kidney disease   [] Difficult urination  [] Frequent urination   [] Blood in urine Skin:  [] Rashes   [] Ulcers  Psychological:  [] History of anxiety   []  History of major depression.  Physical Examination  Vitals:   01/13/18 0851  BP: (!) 202/75  Pulse: 87  Resp: 16  Weight: 201 lb 9.6 oz (91.4 kg)  Height: 5\' 7"  (1.702 m)   Body mass index is 31.58 kg/m. Gen: WD/WN, NAD Head: [place]/AT, No temporalis wasting.  Ear/Nose/Throat: Hearing grossly intact, nares w/o erythema or drainage, poor dentition Eyes: PER, EOMI, sclera nonicteric.  Neck: Supple, no masses.  No bruit or JVD.  Pulmonary:  Good air movement, clear to auscultation bilaterally, no use of accessory muscles.  Cardiac: RRR, normal S1, S2, no Murmurs. Vascular: carotid bruit; scattered varicosities present bilaterally.  Mild venous stasis changes  to the legs bilaterally.  2+ soft pitting edema Vessel Right Left  Radial Palpable Palpable  Brachial Palpable Palpable  Carotid Palpable Palpable  Popliteal Not Palpable Not Palpable  PT Not Palpable Not Palpable  DP Not Palpable Not Palpable  Gastrointestinal: soft, non-distended. No guarding/no peritoneal signs.  Musculoskeletal: M/S 5/5 throughout.  No deformity or atrophy.  Neurologic: CN 2-12 intact. Pain and light touch intact in extremities.  Symmetrical.  Speech is fluent. Motor exam as listed above. Psychiatric: Judgment intact, Mood & affect appropriate for  pt's clinical situation. Dermatologic: mild venous rashes no ulcers noted.  No changes consistent with cellulitis. Lymph : No Cervical lymphadenopathy, no lichenification or skin changes of chronic lymphedema.  CBC Lab Results  Component Value Date   WBC 9.0 04/10/2017   HGB 10.2 (L) 04/10/2017   HCT 32.3 (L) 04/10/2017   MCV 75 (L) 04/10/2017   PLT 390 (H) 04/10/2017    BMET    Component Value Date/Time   NA 141 12/17/2017 1032   NA 142 05/20/2013 0509   K 4.9 12/17/2017 1032   K 3.7 05/20/2013 0509   CL 108 (H) 12/17/2017 1032   CL 108 (H) 05/20/2013 0509   CO2 21 12/17/2017 1032   CO2 30 05/20/2013 0509   GLUCOSE 150 (H) 12/17/2017 1032   GLUCOSE 120 (H) 04/27/2016 1404   GLUCOSE 108 (H) 05/20/2013 0509   BUN 31 (H) 12/17/2017 1032   BUN 13 05/20/2013 0509   CREATININE 1.46 (H) 12/17/2017 1032   CREATININE 0.92 05/20/2013 0509   CALCIUM 9.4 12/17/2017 1032   CALCIUM 8.9 05/20/2013 0509   GFRNONAA 33 (L) 12/17/2017 1032   GFRNONAA >60 05/20/2013 0509   GFRAA 38 (L) 12/17/2017 1032   GFRAA >60 05/20/2013 0509   CrCl cannot be calculated (Patient's most recent lab result is older than the maximum 21 days allowed.).  COAG No results found for: INR, PROTIME  Radiology No results found.   Assessment/Plan 1. Pain in both lower extremities  Recommend:  The patient has atypical pain symptoms for  pure atherosclerotic disease. However, on physical exam there is evidence of mixed venous and arterial disease, given the diminished pulses and the edema associated with venous changes of the legs.  Noninvasive studies including ABI's and venous ultrasound of the legs will be obtained and the patient will follow up with me to review these studies.  The patient should continue walking and begin a more formal exercise program. The patient should continue his antiplatelet therapy and aggressive treatment of the lipid abnormalities.  The patient should begin wearing graduated compression socks 15-20 mmHg strength to control edema.   - VAS US AORTA/IVC/ILIACS; Future - VAS [place] ABI WITH/WO TBI; Future  2. PAD (peripheral artery disease) (HCC)  Recommend:  The patient has atypical pain symptoms for pure atherosclerotic disease. However, on physical exam there is evidence of mixed venous and arterial disease, given the diminished pulses and the edema associated with venous changes of the legs.  Noninvasive studies including ABI's and venous ultrasound of the legs will be obtained and the patient will follow up with me to review these studies.  The patient should continue walking and begin a more formal exercise program. The patient should continue his antiplatelet therapy and aggressive treatment of the lipid abnormalities.  The patient should begin wearing graduated compression socks 15-20 mmHg strength to control edema.   - VAS US AORTA/IVC/ILIACS; Future - VAS [place] ABI WITH/WO TBI; Future  3. Bilateral carotid bruits Recommend:  Given the patient's asymptomatic carotid bruits no invasive testing or surgery at this time.  Duplex ultrasound will be ordered.  Continue antiplatelet therapy as prescribed Continue management of CAD, HTN and Hyperlipidemia Healthy heart diet,  encouraged exercise at least 4 times per week  - VAS US CAROTID; Future  4. Hypertension, unspecified type Continue  antihypertensive medications as already ordered, these medications have been reviewed and there are no changes at this time.   5. Diabetes mellitus without complication (HCC) Continue hypoglycemic medications as already  ordered, these medications have been reviewed and there are no changes at this time.  Hgb A1C to be monitored as already arranged by primary service     Hortencia Pilar, MD  01/13/2018 11:42 AM

## 2018-01-16 ENCOUNTER — Ambulatory Visit (INDEPENDENT_AMBULATORY_CARE_PROVIDER_SITE_OTHER): Payer: Medicare Other

## 2018-01-16 ENCOUNTER — Ambulatory Visit (INDEPENDENT_AMBULATORY_CARE_PROVIDER_SITE_OTHER): Payer: Medicare Other | Admitting: Nurse Practitioner

## 2018-01-16 ENCOUNTER — Encounter (INDEPENDENT_AMBULATORY_CARE_PROVIDER_SITE_OTHER): Payer: Self-pay

## 2018-01-16 ENCOUNTER — Telehealth (INDEPENDENT_AMBULATORY_CARE_PROVIDER_SITE_OTHER): Payer: Self-pay

## 2018-01-16 ENCOUNTER — Telehealth (INDEPENDENT_AMBULATORY_CARE_PROVIDER_SITE_OTHER): Payer: Self-pay | Admitting: Nurse Practitioner

## 2018-01-16 ENCOUNTER — Encounter (INDEPENDENT_AMBULATORY_CARE_PROVIDER_SITE_OTHER): Payer: Self-pay | Admitting: Nurse Practitioner

## 2018-01-16 VITALS — BP 188/64 | HR 70 | Ht 67.0 in | Wt 202.3 lb

## 2018-01-16 DIAGNOSIS — I739 Peripheral vascular disease, unspecified: Secondary | ICD-10-CM

## 2018-01-16 DIAGNOSIS — M79605 Pain in left leg: Secondary | ICD-10-CM

## 2018-01-16 DIAGNOSIS — M79604 Pain in right leg: Secondary | ICD-10-CM

## 2018-01-16 DIAGNOSIS — I6523 Occlusion and stenosis of bilateral carotid arteries: Secondary | ICD-10-CM | POA: Diagnosis not present

## 2018-01-16 DIAGNOSIS — R0989 Other specified symptoms and signs involving the circulatory and respiratory systems: Secondary | ICD-10-CM

## 2018-01-16 DIAGNOSIS — I10 Essential (primary) hypertension: Secondary | ICD-10-CM

## 2018-01-16 DIAGNOSIS — E119 Type 2 diabetes mellitus without complications: Secondary | ICD-10-CM | POA: Diagnosis not present

## 2018-01-16 NOTE — Telephone Encounter (Signed)
Patient is aware, repeated number back to me to be sure she got it correct. Verbalized understanding.

## 2018-01-16 NOTE — Telephone Encounter (Signed)
I spoke with the patient's daughter with the patient present and scheduled the patient for her leg angio and pre-op 01/29/2018 and 02/04/2018 along with the pre-op being on 01/28/2018 . I went over the pre-procedure instructions and will be mailing out the information as well.

## 2018-01-16 NOTE — Progress Notes (Signed)
Subjective:    Patient ID: Jennifer Carrillo, female    DOB: [birth year], 85 y.o.   MRN: [id number] No chief complaint on file.   HPI  Jennifer Carrillo is a 85 y.o. female presents today for follow-up studies regarding painful lower extremities.  She states that the pain is consistent and it is worse with laying down at night, however the pain makes it impossible to walk.  She states that if she is sitting in a dependent position the pain is there however as soon as she lies down flat, the pain becomes intense and she is required to get up.  She states that when she is up to use the restroom melanite the pain eases off.  She describes it as an intense cramping pain.  Patient states that the right lower extremity is worse than left.  She states that this pain has progressed over the last several years.  The patient denies having any open wounds at this time.  She has no history of DVT or phlebitis.  She has no chest pain or shortness of breath.  Also during her physical exam at the previous visit, there was a bruit auscultated over the right carotid.  The patient had a right carotid endarterectomy 10 to 12 years ago per her estimation.  She has not had it followed over the years.  Today the patient underwent a carotid duplex which revealed velocities in the right internal carotid artery consistent with a 40 to 59% stenosis.  The left carotid has velocities consistent with an 80 to 99% stenosis.  The patient also underwent bilateral ABIs.  The right ABI was 0.38, with a left ABI 0.65.  The tibial artery waveforms were monophasic bilaterally.  The bilateral toe waveforms were dampened.  There is no previous study to compare to.  Past Medical History:  Diagnosis Date  . Arthritis   . Asthma   . Calf pain   . COPD (chronic obstructive pulmonary disease) ([place])   . Diabetes ([place])   . Discoid lupus   . Gastro-esophageal reflux   . Glaucoma   . Hyperlipemia   . Hypertension   . Iron deficiency  anemia   . Joint pain   . Leg swelling   . Osteoarthritis   . PVD (peripheral vascular disease) ([place])   . Sinus problem   . Sleep apnea   . Stroke [facility])     Past Surgical History:  Procedure Laterality Date  . BREAST SURGERY    . CAROTID ENDARTERECTOMY Right   . CATARACT EXTRACTION Right 2013  . CORONARY STENT PLACEMENT     L. L. E.  . STENT PLACEMENT VASCULAR ([place] HX) Left    stent placement on LLE  . TUBAL LIGATION      Social History   Socioeconomic History  . Marital status: Married    Spouse name: Not on file  . Number of children: Not on file  . Years of education: Not on file  . Highest education level: Not on file  Occupational History  . Not on file  Social Needs  . Financial resource strain: Not on file  . Food insecurity:    Worry: Not on file    Inability: Not on file  . Transportation needs:    Medical: Not on file    Non-medical: Not on file  Tobacco Use  . Smoking status: Former Research scientist (life sciences)  . Smokeless tobacco: Never Used  . Tobacco comment: quit 31 years  Substance and  Sexual Activity  . Alcohol use: No  . Drug use: No  . Sexual activity: Not on file  Lifestyle  . Physical activity:    Days per week: Not on file    Minutes per session: Not on file  . Stress: Not on file  Relationships  . Social connections:    Talks on phone: Not on file    Gets together: Not on file    Attends religious service: Not on file    Active member of club or organization: Not on file    Attends meetings of clubs or organizations: Not on file    Relationship status: Not on file  . Intimate partner violence:    Fear of current or ex partner: Not on file    Emotionally abused: Not on file    Physically abused: Not on file    Forced sexual activity: Not on file  Other Topics Concern  . Not on file  Social History Narrative  . Not on file    Family History  Problem Relation Age of Onset  . Heart disease Mother   . Diabetes Other   . Hypertension Other   .  Diabetes Sister   . Lung cancer Brother   . Leukemia Daughter   . Bladder Cancer Neg Hx   . Kidney disease Neg Hx   . Prostate cancer Neg Hx     Allergies  Allergen Reactions  . Benazepril Other (See Comments)  . Statins      Review of Systems   Review of Systems: Negative Unless Checked Constitutional: [] Weight loss  [] Fever  [] Chills Cardiac: [] Chest pain   []  Atrial Fibrillation  [] Palpitations   [] Shortness of breath when laying flat   [] Shortness of breath with exertion. [] Shortness of breath at rest Vascular:  [x] Pain in legs with walking   [x] Pain in legs with standing [x] Pain in legs when laying flat   [x] Claudication    [] Pain in feet when laying flat    [] History of DVT   [] Phlebitis   [] Swelling in legs   [] Varicose veins   [] Non-healing ulcers Pulmonary:   [] Uses home oxygen   [] Productive cough   [] Hemoptysis   [] Wheeze  [] COPD   [] Asthma Neurologic:  [] Dizziness   [] Seizures  [] Blackouts [] History of stroke   [] History of TIA  [] Aphasia   [] Temporary Blindness   [] Weakness or numbness in arm   [x] Weakness or numbness in leg Musculoskeletal:   [x] Joint swelling   [x] Joint pain   [] Low back pain  []  History of Knee Replacement [] Arthritis [] back Surgeries  []  Spinal Stenosis    Hematologic:  [] Easy bruising  [] Easy bleeding   [] Hypercoagulable state   [] Anemic Gastrointestinal:  [] Diarrhea   [] Vomiting  [] Gastroesophageal reflux/heartburn   [] Difficulty swallowing. [] Abdominal pain Genitourinary:  [] Chronic kidney disease   [] Difficult urination  [] Anuric   [] Blood in urine [] Frequent urination  [] Burning with urination   [] Hematuria Skin:  [] Rashes   [] Ulcers [] Wounds Psychological:  [] History of anxiety   []  History of major depression  []  Memory Difficulties     Objective:   Physical Exam  BP (!) 188/64 (BP Location: Left Arm, Patient Position: Sitting, Cuff Size: Large)   Pulse 70   Ht 5\' 7"  (1.702 m)   Wt 202 lb 4.8 oz (91.8 kg)   BMI 31.68 kg/m   Gen: WD/WN,  NAD Head: [place]/AT, No temporalis wasting.  Ear/Nose/Throat: Hearing grossly intact, nares w/o erythema or drainage Eyes: PER, EOMI, sclera nonicteric.  Neck: Supple, no masses.  No JVD.  Pulmonary:  Good air movement, no use of accessory muscles.  Carotid bruit auscultated on right Cardiac: RRR Vascular:  Vessel Right Left  Radial Palpable Palpable  Dorsalis Pedis Not Palpable Not Palpable  Posterior Tibial Not Palpable Not Palpable   Gastrointestinal: soft, non-distended. No guarding/no peritoneal signs.  Musculoskeletal: M/S 5/5 throughout.  No deformity or atrophy.  Neurologic: Pain and light touch intact in extremities.  Symmetrical.  Speech is fluent. Motor exam as listed above. Psychiatric: Judgment intact, Mood & affect appropriate for pt's clinical situation. Dermatologic: No Venous rashes. No Ulcers Noted.  No changes consistent with cellulitis. Lymph : No Cervical lymphadenopathy, no lichenification or skin changes of chronic lymphedema.      Assessment & Plan:   1. Bilateral carotid artery stenosis The patient remains asymptomatic with respect to the carotid stenosis.  However, the patient has now progressed and has a lesion the is >70%.  Patient should undergo CT angiography of the carotid arteries to define the degree of stenosis of the internal carotid arteries bilaterally and the anatomic suitability for surgery vs. intervention.  If the patient does indeed need surgery cardiac clearance will be required, once cleared the patient will be scheduled for surgery.  The risks, benefits and alternative therapies were reviewed in detail with the patient.  All questions were answered.  The patient agrees to proceed with imaging.  Continue antiplatelet therapy as prescribed. Continue management of CAD, HTN and Hyperlipidemia. Healthy heart diet, encouraged exercise at least 4 times per week.   - CT ANGIO NECK W OR WO CONTRAST; Future  2. PAD (peripheral artery disease)  (HCC) Recommend:  The patient has evidence of severe atherosclerotic changes of both lower extremities with rest pain that is associated with preulcerative changes and impending tissue loss of the foot.  This represents a limb threatening ischemia and places the patient at the risk for limb loss.  Patient should undergo angiography of the lower extremities with the hope for intervention for limb salvage.The right lower extremity followed by the left.  The risks and benefits as well as the alternative therapies was discussed in detail with the patient.  All questions were answered.  Patient agrees to proceed with angiography.  The patient will follow up with me in the office after the procedure.       3. Hypertension, unspecified type Continue antihypertensive medications as already ordered, these medications have been reviewed and there are no changes at this time.   4. Diabetes mellitus without complication ([place]) Continue hypoglycemic medications as already ordered, these medications have been reviewed and there are no changes at this time.  Hgb A1C to be monitored as already arranged by primary service    Current Outpatient Medications on File Prior to Visit  Medication Sig Dispense Refill  . ACCU-CHEK FASTCLIX LANCETS MISC by Does not apply route 3 (three) times daily after meals.    Marland Kitchen amLODipine (NORVASC) 10 MG tablet Take 1 tablet (10 mg total) by mouth daily. 90 tablet 3  . aspirin 81 MG tablet Take 81 mg by mouth daily.    . digoxin (LANOXIN) 0.125 MG tablet Take 1 tablet (0.125 mg total) by mouth daily. 90 tablet 3  . docusate sodium (COLACE) 100 MG capsule Take 100 mg by mouth daily as needed for mild constipation.    Marland Kitchen donepezil (ARICEPT) 10 MG tablet Take one tab po qhs for memory 90 tablet 3  . glucose blood (ACCU-CHEK SMARTVIEW)  test strip 3 each by Other route 3 (three) times daily after meals. Use as instructed    . hydrALAZINE (APRESOLINE) 25 MG tablet TAKE 1 TABLET BY  MOUTH TWICE A DAY 180 tablet 1  . insulin aspart protamine- aspart (NOVOLOG MIX 70/30) (70-30) 100 UNIT/ML injection Inject 35 Units into the skin 2 (two) times daily with a meal.    . pantoprazole (PROTONIX) 40 MG tablet Take 40 mg by mouth daily.    Marland Kitchen PROAIR HFA 108 (90 Base) MCG/ACT inhaler     . rOPINIRole (REQUIP) 0.5 MG tablet Take 1 tablet (0.5 mg total) by mouth at bedtime. 90 tablet 3   No current facility-administered medications on file prior to visit.     There are no Patient Instructions on file for this visit. No follow-ups on file.   Kris Hartmann, NP  This note was completed with Sales executive.  Any errors are purely unintentional.

## 2018-01-17 ENCOUNTER — Other Ambulatory Visit: Payer: Self-pay

## 2018-01-20 ENCOUNTER — Other Ambulatory Visit (INDEPENDENT_AMBULATORY_CARE_PROVIDER_SITE_OTHER): Payer: Self-pay | Admitting: Vascular Surgery

## 2018-01-20 ENCOUNTER — Telehealth (INDEPENDENT_AMBULATORY_CARE_PROVIDER_SITE_OTHER): Payer: Self-pay | Admitting: Nurse Practitioner

## 2018-01-20 NOTE — Telephone Encounter (Signed)
See below in regards to surgery

## 2018-01-20 NOTE — Telephone Encounter (Signed)
Spoke with the patient and gave her the updated day and time of her pre-op and procedure with Dr. Delana Meyer. The patient's procedure has been changed from 01/29/2018 to 01/22/2018 with a 1:30 pm arrival time. The patient's pre-op has been changed from 01/28/2018 to 01/21/2018 with a 10:45 am arrival time.

## 2018-01-21 ENCOUNTER — Telehealth (INDEPENDENT_AMBULATORY_CARE_PROVIDER_SITE_OTHER): Payer: Self-pay

## 2018-01-21 ENCOUNTER — Telehealth (INDEPENDENT_AMBULATORY_CARE_PROVIDER_SITE_OTHER): Payer: Self-pay | Admitting: Nurse Practitioner

## 2018-01-21 ENCOUNTER — Encounter (INDEPENDENT_AMBULATORY_CARE_PROVIDER_SITE_OTHER): Payer: Self-pay | Admitting: Nurse Practitioner

## 2018-01-21 ENCOUNTER — Encounter
Admission: RE | Admit: 2018-01-21 | Discharge: 2018-01-21 | Disposition: A | Discharge status: Home / Self Care | Payer: Medicare Other | Source: Ambulatory Visit | Attending: Vascular Surgery | Admitting: Vascular Surgery

## 2018-01-21 DIAGNOSIS — I6523 Occlusion and stenosis of bilateral carotid arteries: Secondary | ICD-10-CM | POA: Diagnosis not present

## 2018-01-21 DIAGNOSIS — Z888 Allergy status to other drugs, medicaments and biological substances status: Secondary | ICD-10-CM | POA: Diagnosis not present

## 2018-01-21 DIAGNOSIS — H409 Unspecified glaucoma: Secondary | ICD-10-CM | POA: Diagnosis not present

## 2018-01-21 DIAGNOSIS — Z87891 Personal history of nicotine dependence: Secondary | ICD-10-CM | POA: Diagnosis not present

## 2018-01-21 DIAGNOSIS — Z833 Family history of diabetes mellitus: Secondary | ICD-10-CM | POA: Diagnosis not present

## 2018-01-21 DIAGNOSIS — E785 Hyperlipidemia, unspecified: Secondary | ICD-10-CM | POA: Diagnosis not present

## 2018-01-21 DIAGNOSIS — K219 Gastro-esophageal reflux disease without esophagitis: Secondary | ICD-10-CM | POA: Diagnosis not present

## 2018-01-21 DIAGNOSIS — Z95820 Peripheral vascular angioplasty status with implants and grafts: Secondary | ICD-10-CM | POA: Diagnosis not present

## 2018-01-21 DIAGNOSIS — Z01812 Encounter for preprocedural laboratory examination: Secondary | ICD-10-CM

## 2018-01-21 DIAGNOSIS — G4730 Sleep apnea, unspecified: Secondary | ICD-10-CM | POA: Diagnosis not present

## 2018-01-21 DIAGNOSIS — E119 Type 2 diabetes mellitus without complications: Secondary | ICD-10-CM | POA: Diagnosis not present

## 2018-01-21 DIAGNOSIS — Z8673 Personal history of transient ischemic attack (TIA), and cerebral infarction without residual deficits: Secondary | ICD-10-CM | POA: Diagnosis not present

## 2018-01-21 DIAGNOSIS — I739 Peripheral vascular disease, unspecified: Secondary | ICD-10-CM

## 2018-01-21 DIAGNOSIS — M1990 Unspecified osteoarthritis, unspecified site: Secondary | ICD-10-CM | POA: Diagnosis not present

## 2018-01-21 DIAGNOSIS — I10 Essential (primary) hypertension: Secondary | ICD-10-CM | POA: Diagnosis not present

## 2018-01-21 DIAGNOSIS — Z9851 Tubal ligation status: Secondary | ICD-10-CM | POA: Diagnosis not present

## 2018-01-21 DIAGNOSIS — Z79899 Other long term (current) drug therapy: Secondary | ICD-10-CM | POA: Diagnosis not present

## 2018-01-21 DIAGNOSIS — I70223 Atherosclerosis of native arteries of extremities with rest pain, bilateral legs: Secondary | ICD-10-CM | POA: Diagnosis not present

## 2018-01-21 DIAGNOSIS — Z8249 Family history of ischemic heart disease and other diseases of the circulatory system: Secondary | ICD-10-CM | POA: Diagnosis not present

## 2018-01-21 DIAGNOSIS — J449 Chronic obstructive pulmonary disease, unspecified: Secondary | ICD-10-CM | POA: Diagnosis not present

## 2018-01-21 DIAGNOSIS — Z794 Long term (current) use of insulin: Secondary | ICD-10-CM | POA: Diagnosis not present

## 2018-01-21 DIAGNOSIS — Z7982 Long term (current) use of aspirin: Secondary | ICD-10-CM | POA: Diagnosis not present

## 2018-01-21 HISTORY — DX: Malignant (primary) neoplasm, unspecified: C80.1

## 2018-01-21 LAB — CREATININE, SERUM
Creatinine, Ser: 1.34 mg/dL — ABNORMAL HIGH (ref 0.44–1.00)
GFR calc non Af Amer: 37 mL/min — ABNORMAL LOW (ref >60)
GFR, EST AFRICAN AMERICAN: 43 mL/min — AB (ref >60)

## 2018-01-21 LAB — BUN: BUN: 26 mg/dL — ABNORMAL HIGH (ref 8–23)

## 2018-01-21 MED ORDER — CEFAZOLIN SODIUM-DEXTROSE 2-4 GM/100ML-% IV SOLN: 2.0000 g | Freq: Once | INTRAVENOUS | Status: DC

## 2018-01-21 NOTE — Telephone Encounter (Signed)
Charity at the pre service center called and left a message on the triage line and stated that she needs a call back in regards to the patients prior authorization for her surgery. Please advise  Call back is [phone]

## 2018-01-21 NOTE — Telephone Encounter (Signed)
Please review message below.

## 2018-01-22 ENCOUNTER — Encounter
Admission: RE | Disposition: A | Discharge status: Home / Self Care | Payer: Self-pay | Source: Home / Self Care | Attending: Vascular Surgery

## 2018-01-22 ENCOUNTER — Ambulatory Visit
Admission: RE | Admit: 2018-01-22 | Discharge: 2018-01-22 | Disposition: A | Discharge status: Home / Self Care | Payer: Medicare Other | Attending: Vascular Surgery | Admitting: Vascular Surgery

## 2018-01-22 ENCOUNTER — Ambulatory Visit: Admission: RE | Admit: 2018-01-22 | Payer: Medicare Other | Source: Ambulatory Visit

## 2018-01-22 ENCOUNTER — Other Ambulatory Visit: Payer: Self-pay

## 2018-01-22 DIAGNOSIS — Z87891 Personal history of nicotine dependence: Secondary | ICD-10-CM | POA: Diagnosis not present

## 2018-01-22 DIAGNOSIS — Z888 Allergy status to other drugs, medicaments and biological substances status: Secondary | ICD-10-CM | POA: Diagnosis not present

## 2018-01-22 DIAGNOSIS — Z8673 Personal history of transient ischemic attack (TIA), and cerebral infarction without residual deficits: Secondary | ICD-10-CM | POA: Insufficient documentation

## 2018-01-22 DIAGNOSIS — Z9851 Tubal ligation status: Secondary | ICD-10-CM | POA: Insufficient documentation

## 2018-01-22 DIAGNOSIS — M1990 Unspecified osteoarthritis, unspecified site: Secondary | ICD-10-CM | POA: Insufficient documentation

## 2018-01-22 DIAGNOSIS — E119 Type 2 diabetes mellitus without complications: Secondary | ICD-10-CM | POA: Insufficient documentation

## 2018-01-22 DIAGNOSIS — K219 Gastro-esophageal reflux disease without esophagitis: Secondary | ICD-10-CM | POA: Diagnosis not present

## 2018-01-22 DIAGNOSIS — Z8249 Family history of ischemic heart disease and other diseases of the circulatory system: Secondary | ICD-10-CM | POA: Insufficient documentation

## 2018-01-22 DIAGNOSIS — H409 Unspecified glaucoma: Secondary | ICD-10-CM | POA: Diagnosis not present

## 2018-01-22 DIAGNOSIS — E785 Hyperlipidemia, unspecified: Secondary | ICD-10-CM | POA: Insufficient documentation

## 2018-01-22 DIAGNOSIS — G4730 Sleep apnea, unspecified: Secondary | ICD-10-CM | POA: Diagnosis not present

## 2018-01-22 DIAGNOSIS — I10 Essential (primary) hypertension: Secondary | ICD-10-CM | POA: Diagnosis not present

## 2018-01-22 DIAGNOSIS — Z95820 Peripheral vascular angioplasty status with implants and grafts: Secondary | ICD-10-CM | POA: Insufficient documentation

## 2018-01-22 DIAGNOSIS — Z79899 Other long term (current) drug therapy: Secondary | ICD-10-CM | POA: Diagnosis not present

## 2018-01-22 DIAGNOSIS — J449 Chronic obstructive pulmonary disease, unspecified: Secondary | ICD-10-CM | POA: Diagnosis not present

## 2018-01-22 DIAGNOSIS — Z794 Long term (current) use of insulin: Secondary | ICD-10-CM | POA: Insufficient documentation

## 2018-01-22 DIAGNOSIS — Z833 Family history of diabetes mellitus: Secondary | ICD-10-CM | POA: Insufficient documentation

## 2018-01-22 DIAGNOSIS — Z7982 Long term (current) use of aspirin: Secondary | ICD-10-CM | POA: Diagnosis not present

## 2018-01-22 DIAGNOSIS — I6523 Occlusion and stenosis of bilateral carotid arteries: Secondary | ICD-10-CM | POA: Diagnosis not present

## 2018-01-22 DIAGNOSIS — I70223 Atherosclerosis of native arteries of extremities with rest pain, bilateral legs: Secondary | ICD-10-CM | POA: Diagnosis not present

## 2018-01-22 DIAGNOSIS — I70229 Atherosclerosis of native arteries of extremities with rest pain, unspecified extremity: Secondary | ICD-10-CM

## 2018-01-22 HISTORY — PX: LOWER EXTREMITY ANGIOGRAPHY: CATH118251

## 2018-01-22 LAB — GLUCOSE, CAPILLARY
Glucose-Capillary: 95 mg/dL (ref 70–99)
Glucose-Capillary: 94 mg/dL (ref 70–99)
Glucose-Capillary: 110 mg/dL — ABNORMAL HIGH (ref 70–99)

## 2018-01-22 SURGERY — LOWER EXTREMITY ANGIOGRAPHY
Anesthesia: Moderate Sedation | Site: Leg Lower | Laterality: Right

## 2018-01-22 MED ORDER — FENTANYL CITRATE (PF) 100 MCG/2ML IJ SOLN
INTRAMUSCULAR | Status: DC | PRN
  Administered 2018-01-22 (×2): 50 ug via INTRAVENOUS

## 2018-01-22 MED ORDER — HYDROMORPHONE HCL 1 MG/ML IJ SOLN: 1.0000 mg | Freq: Once | INTRAMUSCULAR | Status: DC | PRN

## 2018-01-22 MED ORDER — SODIUM CHLORIDE 0.9 % IV BOLUS: 500.0000 mL | Freq: Once | INTRAVENOUS | Status: DC

## 2018-01-22 MED ORDER — SODIUM CHLORIDE 0.9 % IV SOLN: INTRAVENOUS | Status: DC

## 2018-01-22 MED ORDER — ONDANSETRON HCL 4 MG/2ML IJ SOLN: 4.0000 mg | Freq: Four times a day (QID) | INTRAMUSCULAR | Status: DC | PRN

## 2018-01-22 MED ORDER — MIDAZOLAM HCL 5 MG/5ML IJ SOLN
INTRAMUSCULAR | Status: AC
  Filled 2018-01-22: qty 5

## 2018-01-22 MED ORDER — HEPARIN (PORCINE) IN NACL 1000-0.9 UT/500ML-% IV SOLN
INTRAVENOUS | Status: AC
  Filled 2018-01-22: qty 1000

## 2018-01-22 MED ORDER — HEPARIN SODIUM (PORCINE) 1000 UNIT/ML IJ SOLN
INTRAMUSCULAR | Status: DC | PRN
  Administered 2018-01-22: 5000 [IU] via INTRAVENOUS

## 2018-01-22 MED ORDER — MORPHINE SULFATE (PF) 2 MG/ML IV SOLN
INTRAVENOUS | Status: AC
  Administered 2018-01-22: 2 mg via INTRAVENOUS
  Filled 2018-01-22: qty 1

## 2018-01-22 MED ORDER — FENTANYL CITRATE (PF) 100 MCG/2ML IJ SOLN
INTRAMUSCULAR | Status: AC
  Filled 2018-01-22: qty 4

## 2018-01-22 MED ORDER — HYDRALAZINE HCL 20 MG/ML IJ SOLN
INTRAMUSCULAR | Status: DC | PRN
  Administered 2018-01-22 (×2): 10 mg via INTRAVENOUS

## 2018-01-22 MED ORDER — MORPHINE SULFATE (PF) 4 MG/ML IV SOLN
2.0000 mg | INTRAVENOUS | Status: DC | PRN
  Administered 2018-01-22: 2 mg via INTRAVENOUS

## 2018-01-22 MED ORDER — CLOPIDOGREL BISULFATE 75 MG PO TABS: 75.0000 mg | ORAL_TABLET | Freq: Every day | ORAL | 4 refills | Status: DC

## 2018-01-22 MED ORDER — METHYLPREDNISOLONE SODIUM SUCC 125 MG IJ SOLR: 125.0000 mg | INTRAMUSCULAR | Status: DC | PRN

## 2018-01-22 MED ORDER — CLOPIDOGREL BISULFATE 75 MG PO TABS
ORAL_TABLET | ORAL | Status: AC
  Filled 2018-01-22: qty 4

## 2018-01-22 MED ORDER — ATORVASTATIN CALCIUM 10 MG PO TABS: 10.0000 mg | ORAL_TABLET | Freq: Every day | ORAL | 11 refills | Status: DC

## 2018-01-22 MED ORDER — MIDAZOLAM HCL 2 MG/2ML IJ SOLN
INTRAMUSCULAR | Status: DC | PRN
  Administered 2018-01-22 (×2): 1 mg via INTRAVENOUS

## 2018-01-22 MED ORDER — IOPAMIDOL (ISOVUE-300) INJECTION 61%
INTRAVENOUS | Status: DC | PRN
  Administered 2018-01-22: 60 mL via INTRA_ARTERIAL

## 2018-01-22 MED ORDER — HEPARIN SODIUM (PORCINE) 1000 UNIT/ML IJ SOLN
INTRAMUSCULAR | Status: AC
  Filled 2018-01-22: qty 1

## 2018-01-22 MED ORDER — LABETALOL HCL 5 MG/ML IV SOLN
INTRAVENOUS | Status: AC
  Filled 2018-01-22: qty 4

## 2018-01-22 MED ORDER — FAMOTIDINE 20 MG PO TABS: 40.0000 mg | ORAL_TABLET | ORAL | Status: DC | PRN

## 2018-01-22 MED ORDER — HYDRALAZINE HCL 20 MG/ML IJ SOLN
INTRAMUSCULAR | Status: AC
  Filled 2018-01-22: qty 1

## 2018-01-22 MED ORDER — LABETALOL HCL 5 MG/ML IV SOLN
INTRAVENOUS | Status: DC | PRN
  Administered 2018-01-22 (×2): 10 mg via INTRAVENOUS

## 2018-01-22 MED ORDER — CLOPIDOGREL BISULFATE 300 MG PO TABS
300.0000 mg | ORAL_TABLET | ORAL | Status: AC
  Administered 2018-01-22: 300 mg via ORAL

## 2018-01-22 MED ORDER — LIDOCAINE HCL (PF) 1 % IJ SOLN
INTRAMUSCULAR | Status: AC
  Filled 2018-01-22: qty 30

## 2018-01-22 MED ORDER — DIPHENHYDRAMINE HCL 50 MG/ML IJ SOLN: 50.0000 mg | Freq: Once | INTRAMUSCULAR | Status: DC

## 2018-01-22 MED ORDER — SODIUM CHLORIDE 0.9 % IV BOLUS
500.0000 mL | Freq: Once | INTRAVENOUS | Status: AC
  Administered 2018-01-22: 1000 mL via INTRAVENOUS

## 2018-01-22 SURGICAL SUPPLY — 14 items
CATH PIG 70CM (CATHETERS) ×1 IMPLANT
DEVICE PRESTO INFLATION (MISCELLANEOUS) ×2 IMPLANT
DEVICE STARCLOSE SE CLOSURE (Vascular Products) ×2 IMPLANT
NDL ENTRY 21GA 7CM ECHOTIP (NEEDLE) IMPLANT
NEEDLE ENTRY 21GA 7CM ECHOTIP (NEEDLE) ×2 IMPLANT
PACK ANGIOGRAPHY (CUSTOM PROCEDURE TRAY) ×2 IMPLANT
SET INTRO CAPELLA COAXIAL (SET/KITS/TRAYS/PACK) ×1 IMPLANT
SHEATH BRITE TIP 5FRX11 (SHEATH) ×1 IMPLANT
SHEATH BRITE TIP 7FRX11 (SHEATH) ×2 IMPLANT
SHIELD X-DRAPE GOLD 12X17 (MISCELLANEOUS) ×1 IMPLANT
STENT LIFESTREAM 8X58X80 (Permanent Stent) ×2 IMPLANT
TUBING CONTRAST HIGH PRESS 72 (TUBING) ×1 IMPLANT
WIRE J 3MM .035X145CM (WIRE) ×1 IMPLANT
WIRE MAGIC TOR.035 180C (WIRE) ×2 IMPLANT

## 2018-01-22 NOTE — Op Note (Signed)
[name]  Percutaneous Study/Intervention Procedural Note   Date of Surgery: 01/22/2018  Surgeon:[name], [name]   Pre-operative Diagnosis: Atherosclerotic occlusive disease bilateral lower extremities with rest pain symptoms  Post-operative diagnosis:  Same  Procedure(s) Performed:  1.  Abdominal aortogram  2.  Bilateral distal runoff  3.  Percutaneous transluminal angioplasty and stent placement right common iliac artery; "kissing balloon" technique  4.  Percutaneous transluminal and plasty and stent placement left common iliac artery; "kissing balloon" technique  5.  Ultrasound guided access bilateral common femoral arteries  6.  StarClose closure device bilateral common femoral arteries  Anesthesia: Conscious sedation was administered under my direct supervision by the interventional radiology RN. IV Versed plus fentanyl were utilized. Continuous ECG, pulse oximetry and blood pressure was monitored throughout the entire procedure. Conscious sedation was for a total of 41 minutes.  Sheath: 7 French sheath bilateral common femoral arteries retrograde  Contrast: 60  Fluoroscopy Time: 2.7  Indications: The patient presented to the office complaining of increased pain in her lower extremities right more severe than left both with activity but also now at rest.  Physical examination as well as noninvasive studies demonstrated severe atherosclerotic occlusive disease.  Risks and benefits for angiography for intervention and relief of rest pain as well as limb salvage were reviewed.  All questions have been answered patient agrees to proceed.  Procedure:  Jennifer Carrillo a 85 y.o. female who was identified and appropriate procedural time out was performed.  The patient was then placed supine on the table and prepped and draped in the usual sterile fashion.  Ultrasound was used to evaluate the left common femoral artery.  It was echolucent and pulsatile  indicating it is patent .  An ultrasound image was acquired for the permanent record.  A micropuncture needle was used to access the left common femoral artery under direct ultrasound guidance.  The microwire was then advanced under fluoroscopic guidance without difficulty followed by the micro-sheath  A 0.035 J wire was advanced without resistance and a 5Fr sheath was placed.    The pigtail catheter was then positioned at the level of T12 and an AP image of the aorta was obtained. After review the images the pigtail catheter was repositioned above the aortic bifurcation and bilateral oblique views of the pelvis were obtained. Subsequently the detector was returned to the AP position and bilateral lower extremity runoff was obtained.  After review the images the ultrasound was delivered back onto the sterile field. The right common femoral was then imaged with the ultrasound it was noted to be echolucent and pulsatile indicating patency. Images recorded for the permanent record. Under real-time visualization a microneedle was inserted into the anterior wall the common femoral artery microwire was then advanced without difficulty under fluoroscopic guidance followed by placement of the micro-sheath.  A Magic torque wire was then negotiated under fluoroscopic guidance into the aorta. 7  French sheath was then placed.  4000 units of heparin was given and allowed to circulate for proximally 4 minutes.  The left 5 French sheath was then upsized to a 7 [place] sheath as well after a Magic torque wire was advanced through the pigtail catheter on the left. Magnified images of the aortic bifurcation were then made using hand injection contrast from the femoral sheaths. After appropriate sizing a 8 mm x 58 mm lifestream stent was selected for the right and a 8 mm x 58 mm lifestream stent was selected for the  left. There were then advanced and positioned just above the aortic bifurcation. Insufflation for full expansion  of the stents was performed simultaneously. Follow-up imaging was then performed.  The pigtail catheter was then introduced up the right and bolus injection of contrast was used to perform final imaging of the distal aortic reconstruction.  Hand-injection of contrast through the right sheath was then used to perform distal runoff.  Oblique views were then obtained of the groins in succession and Star close device is deployed without difficulty. There were no immediate complications   Findings:   Aortogram:  The abdominal aorta is opacified with a bolus injection contrast. Demonstrates diffuse disease but there are no hemodynamically significant lesions noted until the distal aortic bifurcation where bilateral greater than 80% ostial common iliac lesions are identified. There is mild poststenotic dilatation noted of the common iliac arteries as well.  In the mid common iliac arteries there are greater than 60% lesions identified also.  The right internal iliac artery is widely patent the left internal iliac artery is diffusely diseased and stenotic.  The external iliac arteries are patent without hemodynamically significant lesions bilaterally.  Incidental notation is made of bilateral renal artery stenosis with a greater than 80% stenosis on the left and a 60 to 70% stenosis on the right.  Right Lower Extremity: The right common femoral is opacified with contrast it is fairly short and there is a flush occlusion of the SFA.  The profunda femoris appears to be patent and free of hemodynamically significant stenoses.  There are extensive collaterals from the profunda femoris reconstituting the popliteal.  The SFA does reconstitute in the mid thigh although it is diffusely diseased it is identified down to Hunter's canal where the popliteal artery is more fully reconstituted and is patent throughout its course.  The trifurcation is diseased with hemodynamically significant stenosis within the tibioperoneal  trunk and peroneal.  The posterior tibial appears patent down to the foot.  The anterior tibial is patent throughout its entire course and is the dominant runoff from its origin down to the foot.  Left Lower Extremity: The left common femoral and profunda femoris is opacified with a bolus injection of contrast.  There is moderate disease within the common femoral on the left there is significant SFA disease.  Profunda femoris is patent without hemodynamically significant stenosis identified.  Following placement of the iliac stents there is now wide patency of the iliac arteries bilaterally with less than 5% residual stenosis with rapid flow through the aortic bifurcation bilaterally.  Summary:  Successful reconstruction of the distal aorta and bilateral iliac arteries  Disposition: Patient was taken to the recovery room in stable condition having tolerated the procedure well.  Belenda Cruise Wylie Russon 01/22/2018,4:48 PM

## 2018-01-22 NOTE — Progress Notes (Signed)
Patient remains clinically stable post procedure. Vitals stable. Bil. Groin sites without bleeding nor hematoma, Dr Delana Meyer out to speak with patient with questions answered to daughter and patient. Po plavix given per orders, along with home prescription given to daughter.

## 2018-01-22 NOTE — Telephone Encounter (Signed)
I returned the call to the patient's daughter and the patient answered. I explained that I could not move her procedure from 02/04/2018 to 02/05/2018 and that if moved it would be moved to February. The patient did not want to move and requested that her procedure remain on 02/04/2018.

## 2018-01-22 NOTE — H&P (Signed)
[facility] History & Physical Update  The patient was interviewed and re-examined.  The patient's previous History and Physical has been reviewed and is unchanged.  There is no change in the plan of care. We plan to proceed with the scheduled procedure.  Hortencia Pilar, MD  01/22/2018, 3:38 PM

## 2018-01-22 NOTE — Telephone Encounter (Signed)
This has been forwarded to [place] to check prior authorization.

## 2018-01-24 ENCOUNTER — Other Ambulatory Visit: Payer: Self-pay

## 2018-01-27 ENCOUNTER — Other Ambulatory Visit (INDEPENDENT_AMBULATORY_CARE_PROVIDER_SITE_OTHER): Payer: Self-pay | Admitting: Nurse Practitioner

## 2018-01-27 ENCOUNTER — Ambulatory Visit
Admission: RE | Admit: 2018-01-27 | Discharge: 2018-01-27 | Disposition: A | Discharge status: Home / Self Care | Payer: Medicare Other | Source: Ambulatory Visit | Attending: Nurse Practitioner | Admitting: Nurse Practitioner

## 2018-01-27 DIAGNOSIS — I6523 Occlusion and stenosis of bilateral carotid arteries: Secondary | ICD-10-CM | POA: Insufficient documentation

## 2018-01-27 MED ORDER — IOPAMIDOL (ISOVUE-370) INJECTION 76%
60.0000 mL | Freq: Once | INTRAVENOUS | Status: AC | PRN
  Administered 2018-01-27: 60 mL via INTRAVENOUS

## 2018-01-27 NOTE — Progress Notes (Signed)
She has an appointment with me on 02/07/2017 with ABIs.  Can we move her to a day with Dr. Delana Meyer, to his schedule ? Could you call her to reschedule this? Keep the ABIs and he can review this CT as well thanks.

## 2018-01-28 ENCOUNTER — Encounter: Payer: Self-pay | Admitting: Vascular Surgery

## 2018-01-28 ENCOUNTER — Inpatient Hospital Stay: Admission: RE | Admit: 2018-01-28 | Payer: Medicare HMO | Source: Ambulatory Visit

## 2018-01-30 ENCOUNTER — Other Ambulatory Visit (INDEPENDENT_AMBULATORY_CARE_PROVIDER_SITE_OTHER): Payer: Self-pay | Admitting: Nurse Practitioner

## 2018-01-30 ENCOUNTER — Telehealth (INDEPENDENT_AMBULATORY_CARE_PROVIDER_SITE_OTHER): Payer: Self-pay | Admitting: Vascular Surgery

## 2018-01-30 DIAGNOSIS — I739 Peripheral vascular disease, unspecified: Secondary | ICD-10-CM

## 2018-01-30 MED ORDER — HYDROCODONE-ACETAMINOPHEN 5-325 MG PO TABS: 1.0000 | ORAL_TABLET | Freq: Four times a day (QID) | ORAL | 0 refills | Status: AC | PRN

## 2018-01-30 NOTE — Telephone Encounter (Signed)
This has been sent in  

## 2018-01-30 NOTE — Telephone Encounter (Signed)
Spoke with pt and informed her of Fallon's message. Arna Medici, pt agreed to try some pain medication. She verified that her pharmacy is what we have on file the [facility] in [place].

## 2018-01-30 NOTE — Telephone Encounter (Signed)
Jennifer Carrillo is likely still suffering from rest pain in her left leg.  We have worked on right leg and the intervention for her left leg is scheduled on Tuesday.  Unfortunately we wouldn't be able to move it up due to the schedule currently being full.  I can call a one time prescription for something for pain to help until her procedure.  If she is amenable to this, please confirm the pharmacy on file.

## 2018-01-30 NOTE — Telephone Encounter (Signed)
Please advise on below  

## 2018-02-02 ENCOUNTER — Other Ambulatory Visit: Payer: Self-pay | Admitting: Internal Medicine

## 2018-02-02 DIAGNOSIS — I482 Chronic atrial fibrillation, unspecified: Secondary | ICD-10-CM

## 2018-02-03 ENCOUNTER — Other Ambulatory Visit (INDEPENDENT_AMBULATORY_CARE_PROVIDER_SITE_OTHER): Payer: Self-pay | Admitting: Nurse Practitioner

## 2018-02-03 MED ORDER — DEXTROSE 5 % IV SOLN
2.0000 g | Freq: Once | INTRAVENOUS | Status: DC
  Filled 2018-02-03: qty 20

## 2018-02-04 ENCOUNTER — Other Ambulatory Visit: Payer: Self-pay | Admitting: Internal Medicine

## 2018-02-04 ENCOUNTER — Encounter: Admission: RE | Payer: Self-pay | Source: Home / Self Care

## 2018-02-04 ENCOUNTER — Ambulatory Visit: Admission: RE | Admit: 2018-02-04 | Payer: Medicare Other | Source: Home / Self Care | Admitting: Vascular Surgery

## 2018-02-04 ENCOUNTER — Telehealth (INDEPENDENT_AMBULATORY_CARE_PROVIDER_SITE_OTHER): Payer: Self-pay

## 2018-02-04 ENCOUNTER — Other Ambulatory Visit: Payer: Self-pay | Admitting: Adult Health

## 2018-02-04 DIAGNOSIS — I10 Essential (primary) hypertension: Secondary | ICD-10-CM

## 2018-02-04 SURGERY — LOWER EXTREMITY ANGIOGRAPHY
Anesthesia: Moderate Sedation | Site: Leg Lower | Laterality: Left

## 2018-02-04 MED ORDER — AMLODIPINE BESYLATE 10 MG PO TABS: 10.0000 mg | ORAL_TABLET | Freq: Every day | ORAL | 3 refills | Status: DC

## 2018-02-04 NOTE — Telephone Encounter (Signed)
Patient's daughter called stating that a call came from our office about the patient's procedure this morning. The daughter stated she canceled the procedure a couple of weeks ago. The procedure was never canceled, there is a call about moving the procedure but once it was explained that it would be moved to February the patient requested that it stay on 02/04/2018. The procedure has been canceled at this time.

## 2018-02-05 ENCOUNTER — Ambulatory Visit (INDEPENDENT_AMBULATORY_CARE_PROVIDER_SITE_OTHER): Payer: Medicare Other | Admitting: Adult Health

## 2018-02-05 ENCOUNTER — Encounter: Payer: Self-pay | Admitting: Adult Health

## 2018-02-05 VITALS — BP 130/70 | HR 71 | Resp 16 | Ht 67.0 in | Wt 205.0 lb

## 2018-02-05 DIAGNOSIS — Z801 Family history of malignant neoplasm of trachea, bronchus and lung: Secondary | ICD-10-CM | POA: Diagnosis not present

## 2018-02-05 DIAGNOSIS — Z79899 Other long term (current) drug therapy: Secondary | ICD-10-CM | POA: Diagnosis not present

## 2018-02-05 DIAGNOSIS — K219 Gastro-esophageal reflux disease without esophagitis: Secondary | ICD-10-CM | POA: Diagnosis not present

## 2018-02-05 DIAGNOSIS — E875 Hyperkalemia: Secondary | ICD-10-CM | POA: Diagnosis not present

## 2018-02-05 DIAGNOSIS — M6530 Trigger finger, unspecified finger: Secondary | ICD-10-CM | POA: Diagnosis not present

## 2018-02-05 DIAGNOSIS — Z7983 Long term (current) use of bisphosphonates: Secondary | ICD-10-CM | POA: Diagnosis not present

## 2018-02-05 DIAGNOSIS — M329 Systemic lupus erythematosus, unspecified: Secondary | ICD-10-CM | POA: Diagnosis not present

## 2018-02-05 DIAGNOSIS — I10 Essential (primary) hypertension: Secondary | ICD-10-CM | POA: Diagnosis not present

## 2018-02-05 DIAGNOSIS — C50512 Malignant neoplasm of lower-outer quadrant of left female breast: Secondary | ICD-10-CM | POA: Diagnosis not present

## 2018-02-05 DIAGNOSIS — Z87891 Personal history of nicotine dependence: Secondary | ICD-10-CM | POA: Diagnosis not present

## 2018-02-05 DIAGNOSIS — Z170 Estrogen receptor positive status [ER+]: Secondary | ICD-10-CM | POA: Diagnosis not present

## 2018-02-05 DIAGNOSIS — Z79811 Long term (current) use of aromatase inhibitors: Secondary | ICD-10-CM | POA: Diagnosis not present

## 2018-02-05 DIAGNOSIS — Z7982 Long term (current) use of aspirin: Secondary | ICD-10-CM | POA: Diagnosis not present

## 2018-02-05 DIAGNOSIS — D649 Anemia, unspecified: Secondary | ICD-10-CM | POA: Insufficient documentation

## 2018-02-05 DIAGNOSIS — Z823 Family history of stroke: Secondary | ICD-10-CM | POA: Diagnosis not present

## 2018-02-05 DIAGNOSIS — E1122 Type 2 diabetes mellitus with diabetic chronic kidney disease: Secondary | ICD-10-CM | POA: Diagnosis not present

## 2018-02-05 DIAGNOSIS — R079 Chest pain, unspecified: Secondary | ICD-10-CM

## 2018-02-05 DIAGNOSIS — R262 Difficulty in walking, not elsewhere classified: Secondary | ICD-10-CM | POA: Diagnosis not present

## 2018-02-05 DIAGNOSIS — R252 Cramp and spasm: Secondary | ICD-10-CM | POA: Diagnosis not present

## 2018-02-05 DIAGNOSIS — I129 Hypertensive chronic kidney disease with stage 1 through stage 4 chronic kidney disease, or unspecified chronic kidney disease: Secondary | ICD-10-CM | POA: Diagnosis not present

## 2018-02-05 DIAGNOSIS — N182 Chronic kidney disease, stage 2 (mild): Secondary | ICD-10-CM | POA: Diagnosis not present

## 2018-02-05 DIAGNOSIS — M1990 Unspecified osteoarthritis, unspecified site: Secondary | ICD-10-CM | POA: Diagnosis not present

## 2018-02-05 DIAGNOSIS — D630 Anemia in neoplastic disease: Secondary | ICD-10-CM | POA: Diagnosis not present

## 2018-02-05 DIAGNOSIS — Z7902 Long term (current) use of antithrombotics/antiplatelets: Secondary | ICD-10-CM | POA: Diagnosis not present

## 2018-02-05 DIAGNOSIS — Z9012 Acquired absence of left breast and nipple: Secondary | ICD-10-CM | POA: Diagnosis not present

## 2018-02-05 NOTE — Progress Notes (Signed)
[facility] [place], [place] [address]  Internal MEDICINE  Office Visit Note  Patient Name: Jennifer Carrillo  [id number]  [id number]  Date of Service: 02/19/2018  Chief Complaint  Patient presents with  . Diabetes  . Hypertension  . Hyperlipidemia    HPI  Is here for follow-up on diabetes, hypertension, and hyperlipidemia.  During this visit patient reports she has had some intermittent chest discomfort.  She has difficulty describing it however she describes it as a tightness.  She denies any shortness of breath.  Her blood pressure is stable.  She denies any chest pain, shortness of breath, headache or other symptoms at this time.   Current Medication: Outpatient Encounter Medications as of 02/05/2018  Medication Sig  . ACCU-CHEK FASTCLIX LANCETS MISC by Does not apply route 3 (three) times daily after meals.  Marland Kitchen amLODipine (NORVASC) 10 MG tablet Take 1 tablet (10 mg total) by mouth daily.  Marland Kitchen aspirin 81 MG tablet Take 81 mg by mouth daily.  Marland Kitchen atorvastatin (LIPITOR) 10 MG tablet Take 1 tablet (10 mg total) by mouth daily.  . cholecalciferol (VITAMIN D3) 25 MCG (1000 UT) tablet Take 1,000 Units by mouth daily.  . clopidogrel (PLAVIX) 75 MG tablet Take 1 tablet (75 mg total) by mouth daily.  . digoxin (LANOXIN) 0.125 MG tablet TAKE 1 TABLET (0.125 MG TOTAL) BY MOUTH DAILY.  Marland Kitchen docusate sodium (COLACE) 100 MG capsule Take 100 mg by mouth daily as needed for mild constipation.  Marland Kitchen donepezil (ARICEPT) 10 MG tablet Take one tab po qhs for memory (Patient taking differently: Take 10 mg by mouth at bedtime. Take one tab po qhs for memory)  . ferrous sulfate 325 (65 FE) MG EC tablet Take 325 mg by mouth daily with breakfast.  . glucose blood (ACCU-CHEK SMARTVIEW) test strip 3 each by Other route 3 (three) times daily after meals. Use as instructed  . hydrALAZINE (APRESOLINE) 25 MG tablet TAKE 1 TABLET BY MOUTH TWICE A DAY (Patient taking differently: Take 25 mg by  mouth 2 (two) times daily. )  . insulin aspart protamine- aspart (NOVOLOG MIX 70/30) (70-30) 100 UNIT/ML injection Inject 35 Units into the skin 2 (two) times daily with a meal.  . pantoprazole (PROTONIX) 40 MG tablet Take 40 mg by mouth daily.  Marland Kitchen PROAIR HFA 108 (90 Base) MCG/ACT inhaler Inhale 2 puffs into the lungs every 6 (six) hours as needed for wheezing or shortness of breath.   . telmisartan (MICARDIS) 80 MG tablet Take 80 mg by mouth daily.  . vitamin C (ASCORBIC ACID) 500 MG tablet Take 500 mg by mouth daily as needed (cold symptoms).  . [DISCONTINUED] rOPINIRole (REQUIP) 0.5 MG tablet Take 1 tablet (0.5 mg total) by mouth at bedtime.  . [DISCONTINUED] ceFAZolin (ANCEF) 2 g in dextrose 5 % 50 mL IVPB    No facility-administered encounter medications on file as of 02/05/2018.     Surgical History: Past Surgical History:  Procedure Laterality Date  . BREAST SURGERY Left    left lumpectomy  . CAROTID ENDARTERECTOMY Right   . CATARACT EXTRACTION Right 2013  . CORONARY STENT PLACEMENT     L. L. E.  . EYE SURGERY Bilateral    cataract  . LOWER EXTREMITY ANGIOGRAPHY Right 01/22/2018   Procedure: LOWER EXTREMITY ANGIOGRAPHY;  Surgeon: Katha Cabal, MD;  Location: [facility];  Service: Cardiovascular;  Laterality: Right;  . STENT PLACEMENT VASCULAR ([facility] HX) Left    stent  placement on LLE  . TUBAL LIGATION      Medical History: Past Medical History:  Diagnosis Date  . Arthritis   . Asthma   . Calf pain   . Cancer [facility]) 2016   left breast  . COPD (chronic obstructive pulmonary disease) ([place])   . Diabetes ([place])   . Discoid lupus   . Gastro-esophageal reflux   . Glaucoma   . Hyperlipemia   . Hypertension   . Iron deficiency anemia   . Joint pain   . Leg swelling   . Osteoarthritis   . PVD (peripheral vascular disease) ([place])   . Sinus problem   . Sleep apnea    No CPAP/ Can't tolerate  . Stroke [facility]) 1987    Family History: Family History  Problem  Relation Age of Onset  . Heart disease Mother   . Diabetes Other   . Hypertension Other   . Diabetes Sister   . Lung cancer Brother   . Leukemia Daughter   . Bladder Cancer Neg Hx   . Kidney disease Neg Hx   . Prostate cancer Neg Hx     Social History   Socioeconomic History  . Marital status: Married    Spouse name: Not on file  . Number of children: Not on file  . Years of education: Not on file  . Highest education level: Not on file  Occupational History  . Not on file  Social Needs  . Financial resource strain: Not on file  . Food insecurity:    Worry: Not on file    Inability: Not on file  . Transportation needs:    Medical: Not on file    Non-medical: Not on file  Tobacco Use  . Smoking status: Former Smoker    Last attempt to quit: 1987    Years since quitting: 33.1  . Smokeless tobacco: Never Used  . Tobacco comment: quit 31 years  Substance and Sexual Activity  . Alcohol use: No  . Drug use: No  . Sexual activity: Not on file  Lifestyle  . Physical activity:    Days per week: Not on file    Minutes per session: Not on file  . Stress: Not on file  Relationships  . Social connections:    Talks on phone: Not on file    Gets together: Not on file    Attends religious service: Not on file    Active member of club or organization: Not on file    Attends meetings of clubs or organizations: Not on file    Relationship status: Not on file  . Intimate partner violence:    Fear of current or ex partner: Not on file    Emotionally abused: Not on file    Physically abused: Not on file    Forced sexual activity: Not on file  Other Topics Concern  . Not on file  Social History Narrative  . Not on file      Review of Systems  Constitutional: Negative for chills, fatigue and unexpected weight change.  HENT: Negative for congestion, rhinorrhea, sneezing and sore throat.   Eyes: Negative for photophobia, pain and redness.  Respiratory: Negative for cough,  chest tightness and shortness of breath.   Cardiovascular: Negative for chest pain and palpitations.  Gastrointestinal: Negative for abdominal pain, constipation, diarrhea, nausea and vomiting.  Endocrine: Negative.   Genitourinary: Negative for dysuria and frequency.  Musculoskeletal: Negative for arthralgias, back pain, joint swelling and neck pain.  Skin: Negative for rash.  Allergic/Immunologic: Negative.   Neurological: Negative for tremors and numbness.  Hematological: Negative for adenopathy. Does not bruise/bleed easily.  Psychiatric/Behavioral: Negative for behavioral problems and sleep disturbance. The patient is not nervous/anxious.     Vital Signs: BP 130/70   Pulse 71   Resp 16   Ht 5\' 7"  (1.702 m)   Wt 205 lb (93 kg)   SpO2 100%   BMI 32.11 kg/m    Physical Exam Vitals signs and nursing note reviewed.  Constitutional:      General: She is not in acute distress.    Appearance: She is well-developed. She is not diaphoretic.  HENT:     Head: Normocephalic and atraumatic.     Mouth/Throat:     Pharynx: No oropharyngeal exudate.  Eyes:     Pupils: Pupils are equal, round, and reactive to light.  Neck:     Musculoskeletal: Normal range of motion and neck supple.     Thyroid: No thyromegaly.     Vascular: No JVD.     Trachea: No tracheal deviation.  Cardiovascular:     Rate and Rhythm: Normal rate and regular rhythm.     Heart sounds: Normal heart sounds. No murmur. No friction rub. No gallop.   Pulmonary:     Effort: Pulmonary effort is normal. No respiratory distress.     Breath sounds: Normal breath sounds. No wheezing or rales.  Chest:     Chest wall: No tenderness.  Abdominal:     Palpations: Abdomen is soft.     Tenderness: There is no abdominal tenderness. There is no guarding.  Musculoskeletal: Normal range of motion.  Lymphadenopathy:     Cervical: No cervical adenopathy.  Skin:    General: Skin is warm and dry.  Neurological:     Mental  Status: She is alert and oriented to person, place, and time.     Cranial Nerves: No cranial nerve deficit.  Psychiatric:        Behavior: Behavior normal.        Thought Content: Thought content normal.        Judgment: Judgment normal.    Assessment/Plan: 1. Chest pain, unspecified type We spent some significant time in the exam room discussing chest pain and possible etiology of such.  Patient is adamant that she does not want to go to the emergency room for work-up.  A 12-lead EKG was performed and patient agreed to see cardiology for follow-up.  Given that patient's chest pain is nonreproducible and is intermittent over the last week or so I emphatically encouraged the patient to be evaluated as soon as possible.  Also discussed that if patient had any worsening of symptoms or differences she should go directly to the emergency department.  Patient reports that she understands and will follow-up as suggested. - Ambulatory referral to Cardiology - EKG 12-Lead  2. Essential hypertension Stable at this time, continue current therapy.  General Counseling: abigaile rossie understanding of the findings of todays visit and agrees with plan of treatment. I have discussed any further diagnostic evaluation that may be needed or ordered today. We also reviewed her medications today. she has been encouraged to call the office with any questions or concerns that should arise related to todays visit.    Orders Placed This Encounter  Procedures  . Ambulatory referral to Cardiology  . EKG 12-Lead    No orders of the defined types were placed in this encounter.   Time  spent: 25 Minutes   This patient was seen by Orson Gear AGNP-C in Collaboration with Dr [name] as a part of collaborative care agreement     Kendell Bane AGNP-C Internal medicine

## 2018-02-05 NOTE — Patient Instructions (Signed)

## 2018-02-06 ENCOUNTER — Telehealth: Payer: Self-pay

## 2018-02-06 ENCOUNTER — Other Ambulatory Visit: Payer: Self-pay | Admitting: Internal Medicine

## 2018-02-06 DIAGNOSIS — C50512 Malignant neoplasm of lower-outer quadrant of left female breast: Secondary | ICD-10-CM

## 2018-02-06 DIAGNOSIS — Z170 Estrogen receptor positive status [ER+]: Principal | ICD-10-CM

## 2018-02-06 NOTE — Telephone Encounter (Signed)
Spoke with patient daughter in detail about appointment with our office 02/05/2018 and advised her that during the appointment provider addressed patient complaint of chest pain and we did EKG and pt has canceled her echocardiogram so provider did referral for cardiology dr Nehemiah Massed so patient could get cardio work up. Advised her they did discuss blood sugar and due to our a1c machine issues she could come back next week for nurse visit and we can draw a1c quickly in office and keep her 3 month follow up as well. Beth

## 2018-02-07 ENCOUNTER — Ambulatory Visit (INDEPENDENT_AMBULATORY_CARE_PROVIDER_SITE_OTHER): Payer: Medicare Other | Admitting: Nurse Practitioner

## 2018-02-07 ENCOUNTER — Encounter (INDEPENDENT_AMBULATORY_CARE_PROVIDER_SITE_OTHER): Payer: Medicare Other

## 2018-02-09 DIAGNOSIS — D649 Anemia, unspecified: Secondary | ICD-10-CM | POA: Diagnosis not present

## 2018-02-10 DIAGNOSIS — I10 Essential (primary) hypertension: Secondary | ICD-10-CM | POA: Diagnosis not present

## 2018-02-10 DIAGNOSIS — D649 Anemia, unspecified: Secondary | ICD-10-CM | POA: Diagnosis not present

## 2018-02-12 DIAGNOSIS — D649 Anemia, unspecified: Secondary | ICD-10-CM | POA: Diagnosis not present

## 2018-02-13 ENCOUNTER — Other Ambulatory Visit (INDEPENDENT_AMBULATORY_CARE_PROVIDER_SITE_OTHER): Payer: Self-pay | Admitting: Vascular Surgery

## 2018-02-13 ENCOUNTER — Ambulatory Visit (INDEPENDENT_AMBULATORY_CARE_PROVIDER_SITE_OTHER): Payer: Medicare Other | Admitting: Vascular Surgery

## 2018-02-13 ENCOUNTER — Ambulatory Visit (INDEPENDENT_AMBULATORY_CARE_PROVIDER_SITE_OTHER): Payer: Medicare Other

## 2018-02-13 ENCOUNTER — Encounter (INDEPENDENT_AMBULATORY_CARE_PROVIDER_SITE_OTHER): Payer: Self-pay | Admitting: Vascular Surgery

## 2018-02-13 VITALS — BP 206/67 | HR 73 | Resp 16 | Ht 67.0 in | Wt 205.0 lb

## 2018-02-13 DIAGNOSIS — I70223 Atherosclerosis of native arteries of extremities with rest pain, bilateral legs: Secondary | ICD-10-CM

## 2018-02-13 DIAGNOSIS — I6523 Occlusion and stenosis of bilateral carotid arteries: Secondary | ICD-10-CM

## 2018-02-13 DIAGNOSIS — I150 Renovascular hypertension: Secondary | ICD-10-CM

## 2018-02-13 DIAGNOSIS — Z95820 Peripheral vascular angioplasty status with implants and grafts: Secondary | ICD-10-CM

## 2018-02-13 DIAGNOSIS — E119 Type 2 diabetes mellitus without complications: Secondary | ICD-10-CM

## 2018-02-13 DIAGNOSIS — I739 Peripheral vascular disease, unspecified: Secondary | ICD-10-CM

## 2018-02-13 DIAGNOSIS — Z87891 Personal history of nicotine dependence: Secondary | ICD-10-CM

## 2018-02-13 DIAGNOSIS — I701 Atherosclerosis of renal artery: Secondary | ICD-10-CM | POA: Diagnosis not present

## 2018-02-13 NOTE — Progress Notes (Signed)
MRN : [id number]  Jennifer Carrillo is a 85 y.o. ([birth year]) female who presents with chief complaint of No chief complaint on file. Marland Kitchen  History of Present Illness:   The patient is seen for follow up evaluation of carotid stenosis status post CT angiogram. CT scan was done 01/27/2018. Patient reports that the test went well with no problems or complications.   The patient denies interval amaurosis fugax. There is no recent or interval TIA symptoms or focal motor deficits. There is no prior documented CVA.  The patient is taking enteric-coated aspirin 81 mg daily.  There is no history of migraine headaches. There is no history of seizures.  The patient also returns to the office for followup and review status post angiogram with intervention on 01/22/2018.  Procedure(s) Performed:             1.  Abdominal aortogram             2.  Bilateral distal runoff             3.  Percutaneous transluminal angioplasty and stent placement right common iliac artery; "kissing balloon" technique             4.  Percutaneous transluminal and plasty and stent placement left common iliac artery; "kissing balloon" technique             5.  Ultrasound guided access bilateral common femoral arteries             6.  StarClose closure device bilateral common femoral arteries   The patient notes improvement in the lower extremity symptoms. No interval shortening of the patient's claudication distance or rest pain symptoms. Previous wounds have now healed.  No new ulcers or wounds have occurred since the last visit.  1 of the findings on CT was hemodynamically significant stenosis of the renal artery.  Her blood pressure has been very poorly controlled and she is on greater than 3 antihypertensives.  The patient denies history of DVT, PE or superficial thrombophlebitis. The patient denies recent episodes of angina or shortness of breath.   ABI's Rt= noncompressible and Lt= 0.72 (previous ABI's Rt= 0.38 and  Lt= 0.65) Duplex US of the renal arteries demonstrates greater than 70% stenosis of the left renal artery with a greater than 50% stenosis of the right renal artery   CT angiogram is reviewed by me personally and shows critical calcified stenosis of the left internal carotid artery with widely patent right internal carotid artery status post endarterectomy.   No outpatient medications have been marked as taking for the 02/13/18 encounter (Appointment) with Delana Meyer, [name], MD.    Past Medical History:  Diagnosis Date  . Arthritis   . Asthma   . Calf pain   . Cancer [facility]) 2016   left breast  . COPD (chronic obstructive pulmonary disease) ([place])   . Diabetes ([place])   . Discoid lupus   . Gastro-esophageal reflux   . Glaucoma   . Hyperlipemia   . Hypertension   . Iron deficiency anemia   . Joint pain   . Leg swelling   . Osteoarthritis   . PVD (peripheral vascular disease) ([place])   . Sinus problem   . Sleep apnea    No CPAP/ Can't tolerate  . Stroke [facility]) 1987    Past Surgical History:  Procedure Laterality Date  . BREAST SURGERY Left    left lumpectomy  . CAROTID ENDARTERECTOMY Right   . CATARACT  EXTRACTION Right 2013  . CORONARY STENT PLACEMENT     L. L. E.  . EYE SURGERY Bilateral    cataract  . LOWER EXTREMITY ANGIOGRAPHY Right 01/22/2018   Procedure: LOWER EXTREMITY ANGIOGRAPHY;  Surgeon: Katha Cabal, MD;  Location: [facility];  Service: Cardiovascular;  Laterality: Right;  . STENT PLACEMENT VASCULAR ([facility] HX) Left    stent placement on LLE  . TUBAL LIGATION      Social History Social History   Tobacco Use  . Smoking status: Former Smoker    Last attempt to quit: 1987    Years since quitting: 33.1  . Smokeless tobacco: Never Used  . Tobacco comment: quit 31 years  Substance Use Topics  . Alcohol use: No  . Drug use: No    Family History Family History  Problem Relation Age of Onset  . Heart disease Mother   . Diabetes Other   .  Hypertension Other   . Diabetes Sister   . Lung cancer Brother   . Leukemia Daughter   . Bladder Cancer Neg Hx   . Kidney disease Neg Hx   . Prostate cancer Neg Hx     Allergies  Allergen Reactions  . Benazepril Nausea Only     REVIEW OF SYSTEMS (Negative unless checked)  Constitutional: [] Weight loss  [] Fever  [] Chills Cardiac: [] Chest pain   [] Chest pressure   [] Palpitations   [] Shortness of breath when laying flat   [x] Shortness of breath with exertion. Vascular:  [x] Pain in legs with walking   [] Pain in legs at rest  [] History of DVT   [] Phlebitis   [x] Swelling in legs   [] Varicose veins   [] Non-healing ulcers Pulmonary:   [] Uses home oxygen   [] Productive cough   [] Hemoptysis   [] Wheeze  [x] COPD   [] Asthma Neurologic:  [] Dizziness   [] Seizures   [] History of stroke   [] History of TIA  [] Aphasia   [] Vissual changes   [] Weakness or numbness in arm   [] Weakness or numbness in leg Musculoskeletal:   [] Joint swelling   [x] Joint pain   [] Low back pain Hematologic:  [] Easy bruising  [] Easy bleeding   [] Hypercoagulable state   [] Anemic Gastrointestinal:  [] Diarrhea   [] Vomiting  [] Gastroesophageal reflux/heartburn   [] Difficulty swallowing. Genitourinary:  [] Chronic kidney disease   [] Difficult urination  [] Frequent urination   [] Blood in urine Skin:  [] Rashes   [] Ulcers  Psychological:  [] History of anxiety   []  History of major depression.  Physical Examination  There were no vitals filed for this visit. There is no height or weight on file to calculate BMI. Gen: WD/WN, NAD Head: [place]/AT, No temporalis wasting.  Ear/Nose/Throat: Hearing grossly intact, nares w/o erythema or drainage Eyes: PER, EOMI, sclera nonicteric.  Neck: Supple, no large masses.   Pulmonary:  Good air movement, no audible wheezing bilaterally, no use of accessory muscles.  Cardiac: RRR, no JVD Vascular: Left carotid bruit no right carotid bruit.  Feet are warm with good capillary refill Vessel Right Left    Radial Palpable Palpable  Brachial Palpable Palpable  Carotid Palpable Palpable  PT  not palpable  not palpable  DP  not palpable  not palpable  Gastrointestinal: Non-distended. No guarding/no peritoneal signs.  Musculoskeletal: M/S 5/5 throughout.  No deformity or atrophy.  Neurologic: CN 2-12 intact. Symmetrical.  Speech is fluent. Motor exam as listed above. Psychiatric: Judgment intact, Mood & affect appropriate for pt's clinical situation. Dermatologic: No rashes or ulcers noted.  No changes consistent  with cellulitis. Lymph : No lichenification or skin changes of chronic lymphedema.  CBC Lab Results  Component Value Date   WBC 9.0 04/10/2017   HGB 10.2 (L) 04/10/2017   HCT 32.3 (L) 04/10/2017   MCV 75 (L) 04/10/2017   PLT 390 (H) 04/10/2017    BMET    Component Value Date/Time   NA 141 12/17/2017 1032   NA 142 05/20/2013 0509   K 4.9 12/17/2017 1032   K 3.7 05/20/2013 0509   CL 108 (H) 12/17/2017 1032   CL 108 (H) 05/20/2013 0509   CO2 21 12/17/2017 1032   CO2 30 05/20/2013 0509   GLUCOSE 150 (H) 12/17/2017 1032   GLUCOSE 120 (H) 04/27/2016 1404   GLUCOSE 108 (H) 05/20/2013 0509   BUN 26 (H) 01/21/2018 1113   BUN 31 (H) 12/17/2017 1032   BUN 13 05/20/2013 0509   CREATININE 1.34 (H) 01/21/2018 1113   CREATININE 0.92 05/20/2013 0509   CALCIUM 9.4 12/17/2017 1032   CALCIUM 8.9 05/20/2013 0509   GFRNONAA 37 (L) 01/21/2018 1113   GFRNONAA >60 05/20/2013 0509   GFRAA 43 (L) 01/21/2018 1113   GFRAA >60 05/20/2013 0509   CrCl cannot be calculated (Patient's most recent lab result is older than the maximum 21 days allowed.).  COAG No results found for: INR, PROTIME  Radiology Ct Angio Neck W Or Wo Contrast  Result Date: 01/27/2018 CLINICAL DATA:  Follow-up carotid stenosis. Prior right carotid endarterectomy. EXAM: CT ANGIOGRAPHY NECK TECHNIQUE: Multidetector CT imaging of the neck was performed using the standard protocol during bolus administration of  intravenous contrast. Multiplanar CT image reconstructions and MIPs were obtained to evaluate the vascular anatomy. Carotid stenosis measurements (when applicable) are obtained utilizing NASCET criteria, using the distal internal carotid diameter as the denominator. CONTRAST:  38mL ISOVUE-370 IOPAMIDOL (ISOVUE-370) INJECTION 76% COMPARISON:  03/13/2010 FINDINGS: Aortic arch: Standard 3 vessel aortic arch with mild atherosclerotic plaque. Calcified plaque at the brachiocephalic artery origin and mild calcified and soft plaque in the subclavian arteries without significant associated stenosis. Right carotid system: Patent with evidence of prior endarterectomy. Soft plaque/intimal thickening results in mild common carotid and proximal ICA luminal irregularity with less than 50% proximal ICA narrowing. Carotid siphon atherosclerosis results in moderate to severe cavernous and mild-to-moderate paraclinoid stenosis. Left carotid system: Patent with extensive calcified and soft plaque in the proximal ICA resulting in 70-75% stenosis, progressed from prior. Calcified plaque in the more distal cervical ICA results in 50% stenosis. Carotid siphon atherosclerosis results in moderate cavernous segment stenosis. Vertebral arteries: Patent and dominant left vertebral artery with atherosclerosis resulting and mild V1, proximal V2, and V4 stenoses. Chronic occlusion of the non dominant right vertebral artery at its origin with reconstitution and intermittent reocclusion of the V2 and more distal segments. Skeleton: Mild cervical spondylosis. Advanced upper cervical facet arthrosis with right facet ankylosis at C2-3. Other neck: Small bilateral thyroid nodules, not large enough to recommend routine ultrasound evaluation. Upper chest: Mild centrilobular emphysema. IMPRESSION: 1. 70-75% proximal left ICA stenosis, progressed from prior. 2. Moderate to severe right greater than left cavernous ICA stenoses. 3. Prior right carotid  endarterectomy without significant recurrent stenosis. 4. Chronic occlusion of the nondominant right vertebral artery. 5. Aortic Atherosclerosis (ICD10-I70.0) and Emphysema (ICD10-J43.9). Electronically Signed   By: Logan Bores M.D.   On: 01/27/2018 16:51   Vas [place] Burnard Bunting With/wo Tbi  Result Date: 01/16/2018 LOWER EXTREMITY DOPPLER STUDY Indications: Peripheral artery disease. High Risk Factors: Hypertension.  Vascular Interventions: Hx  of Right endarterectomy 12 years ago per patient. Performing Technologist: Charlane Ferretti RT (R)(VS)  Examination Guidelines: A complete evaluation includes at minimum, Doppler waveform signals and systolic blood pressure reading at the level of bilateral brachial, anterior tibial, and posterior tibial arteries, when vessel segments are accessible. Bilateral testing is considered an integral part of a complete examination. Photoelectric Plethysmograph (PPG) waveforms and toe systolic pressure readings are included as required and additional duplex testing as needed. Limited examinations for reoccurring indications may be performed as noted.  ABI Findings: +---------+------------------+-----+----------+----------------+ Right    Rt Pressure (mmHg)IndexWaveform  Comment          +---------+------------------+-----+----------+----------------+ Brachial 208                                               +---------+------------------+-----+----------+----------------+ ATA                             monophasicNon compressable +---------+------------------+-----+----------+----------------+ PTA      84                0.38 monophasic                 +---------+------------------+-----+----------+----------------+ Great Toe86                0.39 Abnormal                   +---------+------------------+-----+----------+----------------+ +---------+------------------+-----+----------+-------+ Left     Lt Pressure (mmHg)IndexWaveform  Comment  +---------+------------------+-----+----------+-------+ Brachial 219                                      +---------+------------------+-----+----------+-------+ ATA      142               0.65 monophasic        +---------+------------------+-----+----------+-------+ PTA      135               0.62 monophasic        +---------+------------------+-----+----------+-------+ Great Toe101               0.46 Abnormal          +---------+------------------+-----+----------+-------+ +-------+-----------+-----------+------------+------------+ ABI/TBIToday's ABIToday's TBIPrevious ABIPrevious TBI +-------+-----------+-----------+------------+------------+ Right  .38        .39                                 +-------+-----------+-----------+------------+------------+ Left   .65        .46                                 +-------+-----------+-----------+------------+------------+ Summary: Right: Resting right ankle-brachial index indicates severe right lower extremity arterial disease. The right toe-brachial index is abnormal. Left: Resting left ankle-brachial index indicates moderate left lower extremity arterial disease. The left toe-brachial index is abnormal.  *See table(s) above for measurements and observations.  Electronically signed by Hortencia Pilar MD on 01/16/2018 at 5:24:38 PM.   Final    Vas US Carotid  Result Date: 01/16/2018 Carotid Arterial Duplex Study Performing Technologist: Charlane Ferretti RT (R)(VS)  Examination Guidelines: A complete evaluation includes B-mode imaging, spectral  Doppler, color Doppler, and power Doppler as needed of all accessible portions of each vessel. Bilateral testing is considered an integral part of a complete examination. Limited examinations for reoccurring indications may be performed as noted.  Right Carotid Findings: +----------+--------+--------+--------+--------+--------+           PSV cm/sEDV cm/sStenosisDescribeComments  +----------+--------+--------+--------+--------+--------+ CCA Prox  140     13                               +----------+--------+--------+--------+--------+--------+ CCA Mid   105     17                               +----------+--------+--------+--------+--------+--------+ CCA Distal93      15                               +----------+--------+--------+--------+--------+--------+ ICA Prox  138     32                               +----------+--------+--------+--------+--------+--------+ ICA Mid   138     30                               +----------+--------+--------+--------+--------+--------+ ICA Distal95      20                               +----------+--------+--------+--------+--------+--------+ ECA       164     5                                +----------+--------+--------+--------+--------+--------+ Right vertebral antegrade and resistive. Right ICA/CCA ratio = 1.31.  Left Carotid Findings: +----------+--------+--------+--------+--------+--------+           PSV cm/sEDV cm/sStenosisDescribeComments +----------+--------+--------+--------+--------+--------+ CCA Prox  85      10                               +----------+--------+--------+--------+--------+--------+ CCA Mid   121     16                               +----------+--------+--------+--------+--------+--------+ CCA Distal100     15                               +----------+--------+--------+--------+--------+--------+ ICA Prox  175     40                               +----------+--------+--------+--------+--------+--------+ ICA Mid   447     100     80-99%                   +----------+--------+--------+--------+--------+--------+ ICA Distal117     34                               +----------+--------+--------+--------+--------+--------+  ECA       248     55                               +----------+--------+--------+--------+--------+--------+  Left ICA/CCA ratio = 4.47.  Summary: Right Carotid: Velocities in the right ICA are consistent with a 40-59%                stenosis. Left Carotid: Velocities in the left ICA are consistent with a 80-99% stenosis.               The ECA appears >50% stenosed. Vertebrals:  Right vertebral artery demonstrates antegrade flow. Right vertebral              artery was not visualized. Left vertebral artery demonstrates              antegrade flow. Subclavians: Normal flow hemodynamics were seen in bilateral subclavian              arteries. *See table(s) above for measurements and observations.  Electronically signed by Hortencia Pilar MD on 01/16/2018 at 5:24:44 PM.   Final    Vas US Aorta/ivc/iliacs  Result Date: 01/16/2018 ABDOMINAL AORTA STUDY Risk Factors: Hypertension. Limitations: Air/bowel gas and obesity.  Performing Technologist: Charlane Ferretti RT (R)(VS)  Examination Guidelines: A complete evaluation includes B-mode imaging, spectral Doppler, color Doppler, and power Doppler as needed of all accessible portions of each vessel. Bilateral testing is considered an integral part of a complete examination. Limited examinations for reoccurring indications may be performed as noted.  Abdominal Aorta Findings: +-----------+-------+----------+----------+--------+--------+--------+ Location   AP (cm)Trans (cm)PSV (cm/s)WaveformThrombusComments +-----------+-------+----------+----------+--------+--------+--------+ Proximal   2.48   2.53      70                                 +-----------+-------+----------+----------+--------+--------+--------+ Mid        1.81   1.88      108                                +-----------+-------+----------+----------+--------+--------+--------+ Distal     1.90   1.80      76                                 +-----------+-------+----------+----------+--------+--------+--------+ RT CIA Prox1.0    0.9       391                                 +-----------+-------+----------+----------+--------+--------+--------+ LT CIA Prox0.8    0.9       178                                +-----------+-------+----------+----------+--------+--------+--------+  Visualization of the Distal Abdominal Aorta, Right CIA Proximal artery, Right CIA Mid artery, Right CIA Distal artery, Left CIA Proximal artery, Left CIA Mid artery and Left CIA Distal artery was limited. Very limited visualization due to bowel gas and abdominal girth.  Summary: Abdominal Aorta: No evidence of an abdominal aortic aneurysm was visualized. The largest aortic measurement is 2.5 cm. Stenosis: +------------------+-------------+ Location  Stenosis      +------------------+-------------+ Right Common Iliac>50% stenosis +------------------+-------------+  *See table(s) above for measurements and observations.  Electronically signed by Hortencia Pilar MD on 01/16/2018 at 5:24:41 PM.   Final       Assessment/Plan 1. Renovascular hypertension BP today was not acceptable with systolic reading > 903 and diastolic reading >00 while taking 3 medications.  Given that optimal control of the patient's hypertension is important to minimize the risk of heart attack and/or CVA.  The patient's BP and noninvasive studies suggest the possibility of a hemodynamically significant stricture or stenosis.  Angiography was discussed with the patient, the risks and benefits were reviewed and all questions were answered.  The patient has agreed to proceed with angiography and the intention of intervention.     Arrangements will be made to treat the left renal artery.  The patient will continue the current medications, no changes at this time.  The primary medical service will continue aggressive antihypertensive therapy as per the AHA guidelines     2. Renal artery stenosis (HCC) BP today was not acceptable with systolic reading > 923 and diastolic reading >30 while taking 3  medications.  Given that optimal control of the patient's hypertension is important to minimize the risk of heart attack and/or CVA.  The patient's BP and noninvasive studies suggest the possibility of a hemodynamically significant stricture or stenosis.  Angiography was discussed with the patient, the risks and benefits were reviewed and all questions were answered.  The patient has agreed to proceed with angiography and the intention of intervention.     Arrangements will be made to treat the left renal artery.  The patient will continue the current medications, no changes at this time.  The primary medical service will continue aggressive antihypertensive therapy as per the AHA guidelines     3. Bilateral carotid artery stenosis Recommend:  The patient remains asymptomatic with respect to the carotid stenosis.  However, the patient has now progressed and has a lesion the is >75%.  Patient's CT angiography of the carotid arteries confirms >75% left ICA stenosis.  The anatomical considerations support surgery over stenting.  This was discussed in detail with the patient.  The patient does indeed need surgery, therefore, cardiac clearance will be arranged. Once cleared the patient will be scheduled for surgery.  The risks, benefits and alternative therapies were reviewed in detail with the patient.  All questions were answered.  The patient agrees to proceed with surgery of the left carotid artery.  Continue antiplatelet therapy as prescribed. Continue management of CAD, HTN and Hyperlipidemia. Healthy heart diet, encouraged exercise at least 4 times per week.    4. PAD (peripheral artery disease) ([place]) Recommend:  The patient is status post successful angiogram with intervention.  The patient reports that the claudication symptoms and leg pain is essentially gone.   The patient denies lifestyle limiting changes at this point in time.  No further invasive studies, angiography or  surgery at this time The patient should continue walking and begin a more formal exercise program.  The patient should continue antiplatelet therapy and aggressive treatment of the lipid abnormalities  Smoking cessation was again discussed  The patient should continue wearing graduated compression socks 10-15 mmHg strength to control the mild edema.  Patient should undergo noninvasive studies as ordered. The patient will follow up with me after the studies.    5. Diabetes mellitus without complication ([place]) Continue hypoglycemic medications as already ordered, these medications have  been reviewed and there are no changes at this time.  Hgb A1C to be monitored as already arranged by primary service    Hortencia Pilar, MD  02/13/2018 9:57 AM

## 2018-02-14 ENCOUNTER — Ambulatory Visit: Payer: Self-pay | Admitting: Adult Health

## 2018-02-14 ENCOUNTER — Encounter (INDEPENDENT_AMBULATORY_CARE_PROVIDER_SITE_OTHER): Payer: Self-pay

## 2018-02-19 DIAGNOSIS — D649 Anemia, unspecified: Secondary | ICD-10-CM | POA: Diagnosis not present

## 2018-02-24 ENCOUNTER — Other Ambulatory Visit (INDEPENDENT_AMBULATORY_CARE_PROVIDER_SITE_OTHER): Payer: Self-pay | Admitting: Vascular Surgery

## 2018-02-25 MED ORDER — CEFAZOLIN SODIUM-DEXTROSE 2-4 GM/100ML-% IV SOLN
2.0000 g | Freq: Once | INTRAVENOUS | Status: AC
  Administered 2018-02-26: 2 g via INTRAVENOUS

## 2018-02-26 ENCOUNTER — Encounter
Admission: RE | Disposition: A | Discharge status: Home / Self Care | Payer: Self-pay | Source: Home / Self Care | Attending: Vascular Surgery

## 2018-02-26 ENCOUNTER — Encounter (INDEPENDENT_AMBULATORY_CARE_PROVIDER_SITE_OTHER): Payer: Self-pay | Admitting: Vascular Surgery

## 2018-02-26 ENCOUNTER — Encounter: Payer: Self-pay | Admitting: *Deleted

## 2018-02-26 ENCOUNTER — Other Ambulatory Visit: Payer: Self-pay

## 2018-02-26 ENCOUNTER — Ambulatory Visit: Payer: Self-pay | Admitting: Adult Health

## 2018-02-26 ENCOUNTER — Ambulatory Visit
Admission: RE | Admit: 2018-02-26 | Discharge: 2018-02-26 | Disposition: A | Discharge status: Home / Self Care | Payer: Medicare Other | Attending: Vascular Surgery | Admitting: Vascular Surgery

## 2018-02-26 DIAGNOSIS — I6523 Occlusion and stenosis of bilateral carotid arteries: Secondary | ICD-10-CM | POA: Insufficient documentation

## 2018-02-26 DIAGNOSIS — H409 Unspecified glaucoma: Secondary | ICD-10-CM | POA: Diagnosis not present

## 2018-02-26 DIAGNOSIS — J449 Chronic obstructive pulmonary disease, unspecified: Secondary | ICD-10-CM | POA: Diagnosis not present

## 2018-02-26 DIAGNOSIS — Z87891 Personal history of nicotine dependence: Secondary | ICD-10-CM | POA: Diagnosis not present

## 2018-02-26 DIAGNOSIS — Z888 Allergy status to other drugs, medicaments and biological substances status: Secondary | ICD-10-CM | POA: Insufficient documentation

## 2018-02-26 DIAGNOSIS — I701 Atherosclerosis of renal artery: Secondary | ICD-10-CM | POA: Diagnosis not present

## 2018-02-26 DIAGNOSIS — M1990 Unspecified osteoarthritis, unspecified site: Secondary | ICD-10-CM | POA: Diagnosis not present

## 2018-02-26 DIAGNOSIS — E119 Type 2 diabetes mellitus without complications: Secondary | ICD-10-CM | POA: Insufficient documentation

## 2018-02-26 DIAGNOSIS — Z833 Family history of diabetes mellitus: Secondary | ICD-10-CM | POA: Insufficient documentation

## 2018-02-26 DIAGNOSIS — Z8249 Family history of ischemic heart disease and other diseases of the circulatory system: Secondary | ICD-10-CM | POA: Insufficient documentation

## 2018-02-26 DIAGNOSIS — E785 Hyperlipidemia, unspecified: Secondary | ICD-10-CM | POA: Insufficient documentation

## 2018-02-26 DIAGNOSIS — G4730 Sleep apnea, unspecified: Secondary | ICD-10-CM | POA: Diagnosis not present

## 2018-02-26 DIAGNOSIS — Z95820 Peripheral vascular angioplasty status with implants and grafts: Secondary | ICD-10-CM | POA: Diagnosis not present

## 2018-02-26 DIAGNOSIS — I150 Renovascular hypertension: Secondary | ICD-10-CM | POA: Insufficient documentation

## 2018-02-26 DIAGNOSIS — K219 Gastro-esophageal reflux disease without esophagitis: Secondary | ICD-10-CM | POA: Diagnosis not present

## 2018-02-26 DIAGNOSIS — Z955 Presence of coronary angioplasty implant and graft: Secondary | ICD-10-CM | POA: Diagnosis not present

## 2018-02-26 DIAGNOSIS — Z9851 Tubal ligation status: Secondary | ICD-10-CM | POA: Diagnosis not present

## 2018-02-26 DIAGNOSIS — I6529 Occlusion and stenosis of unspecified carotid artery: Secondary | ICD-10-CM | POA: Insufficient documentation

## 2018-02-26 DIAGNOSIS — I739 Peripheral vascular disease, unspecified: Secondary | ICD-10-CM | POA: Insufficient documentation

## 2018-02-26 DIAGNOSIS — Z8673 Personal history of transient ischemic attack (TIA), and cerebral infarction without residual deficits: Secondary | ICD-10-CM | POA: Diagnosis not present

## 2018-02-26 HISTORY — PX: RENAL ANGIOGRAPHY: CATH118260

## 2018-02-26 LAB — CREATININE, SERUM
Creatinine, Ser: 1.29 mg/dL — ABNORMAL HIGH (ref 0.44–1.00)
GFR calc Af Amer: 45 mL/min — ABNORMAL LOW (ref >60)
GFR, EST NON AFRICAN AMERICAN: 39 mL/min — AB (ref >60)

## 2018-02-26 LAB — GLUCOSE, CAPILLARY
Glucose-Capillary: 107 mg/dL — ABNORMAL HIGH (ref 70–99)
Glucose-Capillary: 105 mg/dL — ABNORMAL HIGH (ref 70–99)

## 2018-02-26 LAB — BUN: BUN: 30 mg/dL — ABNORMAL HIGH (ref 8–23)

## 2018-02-26 SURGERY — RENAL ANGIOGRAPHY
Anesthesia: Moderate Sedation | Laterality: Left

## 2018-02-26 MED ORDER — PANTOPRAZOLE SODIUM 40 MG PO TBEC: 40.0000 mg | DELAYED_RELEASE_TABLET | Freq: Every day | ORAL | 1 refills | Status: DC

## 2018-02-26 MED ORDER — METHYLPREDNISOLONE SODIUM SUCC 125 MG IJ SOLR: 125.0000 mg | INTRAMUSCULAR | Status: DC | PRN

## 2018-02-26 MED ORDER — HYDRALAZINE HCL 20 MG/ML IJ SOLN: 5.0000 mg | INTRAMUSCULAR | Status: DC | PRN

## 2018-02-26 MED ORDER — LIDOCAINE HCL (PF) 1 % IJ SOLN
INTRAMUSCULAR | Status: AC
  Filled 2018-02-26: qty 30

## 2018-02-26 MED ORDER — LABETALOL HCL 5 MG/ML IV SOLN: 10.0000 mg | INTRAVENOUS | Status: DC | PRN

## 2018-02-26 MED ORDER — OXYCODONE HCL 5 MG PO TABS: 5.0000 mg | ORAL_TABLET | ORAL | Status: DC | PRN

## 2018-02-26 MED ORDER — HYDRALAZINE HCL 20 MG/ML IJ SOLN
INTRAMUSCULAR | Status: AC
  Filled 2018-02-26: qty 1

## 2018-02-26 MED ORDER — SODIUM CHLORIDE FLUSH 0.9 % IV SOLN
INTRAVENOUS | Status: AC
  Filled 2018-02-26: qty 30

## 2018-02-26 MED ORDER — IOPAMIDOL (ISOVUE-300) INJECTION 61%
INTRAVENOUS | Status: DC | PRN
  Administered 2018-02-26: 70 mL via INTRA_ARTERIAL

## 2018-02-26 MED ORDER — FENTANYL CITRATE (PF) 100 MCG/2ML IJ SOLN
INTRAMUSCULAR | Status: DC | PRN
  Administered 2018-02-26 (×2): 25 ug via INTRAVENOUS
  Administered 2018-02-26: 50 ug via INTRAVENOUS

## 2018-02-26 MED ORDER — HYDROMORPHONE HCL 1 MG/ML IJ SOLN: 1.0000 mg | Freq: Once | INTRAMUSCULAR | Status: DC | PRN

## 2018-02-26 MED ORDER — SODIUM CHLORIDE 0.9 % IV SOLN: 250.0000 mL | INTRAVENOUS | Status: DC | PRN

## 2018-02-26 MED ORDER — FAMOTIDINE 20 MG PO TABS: 40.0000 mg | ORAL_TABLET | ORAL | Status: DC | PRN

## 2018-02-26 MED ORDER — FENTANYL CITRATE (PF) 100 MCG/2ML IJ SOLN
INTRAMUSCULAR | Status: AC
  Filled 2018-02-26: qty 2

## 2018-02-26 MED ORDER — SODIUM CHLORIDE 0.9% FLUSH: 3.0000 mL | INTRAVENOUS | Status: DC | PRN

## 2018-02-26 MED ORDER — ACETAMINOPHEN 325 MG PO TABS: 650.0000 mg | ORAL_TABLET | ORAL | Status: DC | PRN

## 2018-02-26 MED ORDER — ONDANSETRON HCL 4 MG/2ML IJ SOLN: 4.0000 mg | Freq: Four times a day (QID) | INTRAMUSCULAR | Status: DC | PRN

## 2018-02-26 MED ORDER — HEPARIN SODIUM (PORCINE) 1000 UNIT/ML IJ SOLN
INTRAMUSCULAR | Status: AC
  Filled 2018-02-26: qty 1

## 2018-02-26 MED ORDER — DIPHENHYDRAMINE HCL 50 MG/ML IJ SOLN: 50.0000 mg | Freq: Once | INTRAMUSCULAR | Status: DC

## 2018-02-26 MED ORDER — MIDAZOLAM HCL 2 MG/ML PO SYRP: 8.0000 mg | ORAL_SOLUTION | Freq: Once | ORAL | Status: DC | PRN

## 2018-02-26 MED ORDER — SODIUM CHLORIDE 0.9 % IV SOLN: INTRAVENOUS | Status: DC

## 2018-02-26 MED ORDER — MORPHINE SULFATE (PF) 4 MG/ML IV SOLN: 2.0000 mg | INTRAVENOUS | Status: DC | PRN

## 2018-02-26 MED ORDER — SODIUM CHLORIDE 0.9% FLUSH: 3.0000 mL | Freq: Two times a day (BID) | INTRAVENOUS | Status: DC

## 2018-02-26 MED ORDER — MIDAZOLAM HCL 5 MG/5ML IJ SOLN
INTRAMUSCULAR | Status: AC
  Filled 2018-02-26: qty 5

## 2018-02-26 MED ORDER — LIDOCAINE-EPINEPHRINE (PF) 1 %-1:200000 IJ SOLN
INTRAMUSCULAR | Status: AC
  Filled 2018-02-26: qty 30

## 2018-02-26 MED ORDER — SODIUM CHLORIDE 0.9 % IV SOLN
INTRAVENOUS | Status: DC
  Administered 2018-02-26: 12:00:00 via INTRAVENOUS

## 2018-02-26 MED ORDER — HYDRALAZINE HCL 20 MG/ML IJ SOLN
INTRAMUSCULAR | Status: DC | PRN
  Administered 2018-02-26 (×2): 10 mg via INTRAVENOUS

## 2018-02-26 MED ORDER — MIDAZOLAM HCL 2 MG/2ML IJ SOLN
INTRAMUSCULAR | Status: DC | PRN
  Administered 2018-02-26: 0.5 mg via INTRAVENOUS
  Administered 2018-02-26: 2 mg via INTRAVENOUS
  Administered 2018-02-26: 1 mg via INTRAVENOUS

## 2018-02-26 MED ORDER — HEPARIN SODIUM (PORCINE) 1000 UNIT/ML IJ SOLN
INTRAMUSCULAR | Status: DC | PRN
  Administered 2018-02-26: 4000 [IU] via INTRAVENOUS

## 2018-02-26 MED ORDER — HEPARIN (PORCINE) IN NACL 1000-0.9 UT/500ML-% IV SOLN
INTRAVENOUS | Status: AC
  Filled 2018-02-26: qty 1000

## 2018-02-26 SURGICAL SUPPLY — 19 items
CATH BEACON 5 .035 65 C2 TIP (CATHETERS) ×1 IMPLANT
CATH PIG 70CM (CATHETERS) ×1 IMPLANT
CATH VS15FR (CATHETERS) ×1 IMPLANT
DEVICE PRESTO INFLATION (MISCELLANEOUS) ×1 IMPLANT
DEVICE SAFEGUARD 24CM (GAUZE/BANDAGES/DRESSINGS) ×1 IMPLANT
DEVICE STARCLOSE SE CLOSURE (Vascular Products) ×1 IMPLANT
DEVICE TORQUE .025-.038 (MISCELLANEOUS) ×1 IMPLANT
GUIDEWIRE ANGLED .035 180CM (WIRE) ×1 IMPLANT
NDL ENTRY 21GA 7CM ECHOTIP (NEEDLE) IMPLANT
NEEDLE ENTRY 21GA 7CM ECHOTIP (NEEDLE) ×2 IMPLANT
PACK ANGIOGRAPHY (CUSTOM PROCEDURE TRAY) ×1 IMPLANT
SET INTRO CAPELLA COAXIAL (SET/KITS/TRAYS/PACK) ×1 IMPLANT
SHEATH BRITE TIP 5FRX11 (SHEATH) ×1 IMPLANT
SHEATH HIGHFLEX ANSEL 6FRX55 (SHEATH) ×1 IMPLANT
STENT LIFESTREAM 6X26X80 (Permanent Stent) ×1 IMPLANT
SYR MEDRAD MARK V 150ML (SYRINGE) ×1 IMPLANT
TUBING CONTRAST HIGH PRESS 72 (TUBING) ×1 IMPLANT
WIRE J 3MM .035X145CM (WIRE) ×2 IMPLANT
WIRE MAGIC TOR.035 180C (WIRE) ×1 IMPLANT

## 2018-02-26 NOTE — Op Note (Signed)
[facility] Percutaneous Study/Intervention Procedural Note    Surgeon(s): Nurse, children's: None  Pre-operative Diagnosis: Greater than 80% left renal artery stenosis, renovascular hypertension  Post-operative diagnosis: Same  Procedure(s) Performed: 1. Ultrasound guidance for vascular access right common femoral artery 2. Catheter placement into left and right renal arteries from right femoral approach 3. Aortogram and selective left and right renal angiogram 4. Balloon expandable stent placement to 6 mm with a 6 mm diameter x 24 mm length lifestream stent 5. StarClose closure device right femoral artery  Contrast: 70 cc  EBL: Less than 10 cc  Fluoro Time: 16.0 minutes  Moderate conscious sedation: Approximately 39 minutes with intravenous Versed and Fentanyl.  Continuous ECG pulse oximetry and cardiopulmonary pulmonary monitoring was performed throughout the entire procedure by the interventional radiology nurse.  Indications: The patient is a 85 year old woman with worsening severe hypertension despite 4 antihypertensive medications. The patient has suboptimal blood pressure control despite multiple antihypertensives and a noninvasive study demonstrating hemodynamically significant left renal artery stenosis with a possible greater than 60% right renal artery stenosis. Given the clinical scenario and the noninvasive findings, angiogram is indicated for further evaluation of her renal artery and potential treatment. Risks and benefits are discussed and informed consent is obtained.  Procedure: The patient was identified and appropriate procedural time out was performed. The patient was then placed supine on the table and prepped and draped in the usual sterile fashion.Moderate conscious sedation was administered with a face to face encounter with the patient  throughout the procedure with my supervision of the RN administering medicines and monitoring the patients vital signs and mental status throughout from the start of the procedure until the patient was taken to the recovery room  Ultrasound was used to evaluate the right common femoral artery. It was patent . A digital ultrasound image was acquired. A Seldinger needle was used to access the right common femoral artery under direct ultrasound guidance and a permanent image was performed. A 0.035 J wire was advanced without resistance and a 5Fr sheath was placed. Pigtail catheter was placed into the aorta at the L1 level and an AP aortogram was performed. This demonstrated the origins of the renal arteries at the L2 level. The patient was then systemically heparinized with 4000 units of intravenous heparin.   I used a V S1 catheter to cannulate the left renal artery and selective imaging was performed. This confirmed a 90% of the left renal artery.  At this point I selected the Magic torque wire and crossed the lesion without difficulty.  With the V S1 catheter and wire engaging the renal the sheath was advanced so that the tip of the sheath was even with the ostia of the renal artery.  I then selected a 6 mm diameter x 26 mm length balloon expandable stent and brought this across the lesion.  This was deployed encompassing the lesion with its proximal extent going back into the aorta for a mm or two.  This was inflated to 10 ATM and the waist resolved.  Completion angiogram showed less than 5% residual stenosis.  The wire was then removed and advanced proximally.  The V S1 catheter was then reintroduced and the right renal artery was selected.  Hand-injection of contrast was then performed to demonstrate the right renal artery.  After review of this image it appeared that the stenosis of the proximal right renal artery is 50%.  Given this finding, I will monitor the  patient and if her hypertension is now  under control I will continue to observe the right renal artery.  However, if her hypertension remains so poorly controlled consideration for right renal artery intervention will be given.   The guide catheter was removed. Oblique arteriogram was performed of the right femoral artery and StarClose closure device was deployed in the usual fashion with excellent hemostatic result. The patient was taken to the recovery room in stable condition having tolerated the procedure well.  Findings:  Aortogram/Renal Arteries:Abdominal aorta is opacified with a bolus injection of contrast.  Bilateral renal arteries are noted at the L2 level.  There are no hemodynamically significant lesions noted in the aorta.  The left renal artery demonstrates a greater than 80% stenosis at its proximal segment.  This is treated with a 6 mm x 26 mm lifestream stent.  Post deployment there is less than 5% residual stenosis with preservation of distal flow.  The right renal artery appears to have approximately 50% stenosis there does appear to be some mild poststenotic dilatation.  Distally there are no abnormalities identified.  As noted above if we now regain good control of her hypertension with medical therapy I would continue to observe the right renal artery.  However, should her hypertension continue to be poorly controlled then consideration for right renal artery intervention will be given and this will be discussed with the patient.   Condition:  Stable  Complications: None   Jennifer Carrillo 02/26/2018 2:37 PM  This note was created with Dragon Medical transcription system. Any errors in dictation are purely unintentional.

## 2018-02-26 NOTE — H&P (Signed)
[facility] History & Physical Update  The patient was interviewed and re-examined.  The patient's previous History and Physical has been reviewed and is unchanged.  There is no change in the plan of care. We plan to proceed with the scheduled procedure.  Hortencia Pilar, MD  02/26/2018, 12:31 PM

## 2018-02-27 ENCOUNTER — Encounter: Payer: Self-pay | Admitting: Vascular Surgery

## 2018-03-07 DIAGNOSIS — I70219 Atherosclerosis of native arteries of extremities with intermittent claudication, unspecified extremity: Secondary | ICD-10-CM | POA: Insufficient documentation

## 2018-03-07 DIAGNOSIS — I10 Essential (primary) hypertension: Secondary | ICD-10-CM | POA: Diagnosis not present

## 2018-03-07 DIAGNOSIS — R0602 Shortness of breath: Secondary | ICD-10-CM | POA: Diagnosis not present

## 2018-03-07 DIAGNOSIS — R9431 Abnormal electrocardiogram [ECG] [EKG]: Secondary | ICD-10-CM | POA: Insufficient documentation

## 2018-03-07 DIAGNOSIS — I6523 Occlusion and stenosis of bilateral carotid arteries: Secondary | ICD-10-CM | POA: Insufficient documentation

## 2018-03-07 DIAGNOSIS — I739 Peripheral vascular disease, unspecified: Secondary | ICD-10-CM | POA: Insufficient documentation

## 2018-03-07 DIAGNOSIS — I208 Other forms of angina pectoris: Secondary | ICD-10-CM | POA: Diagnosis not present

## 2018-03-12 DIAGNOSIS — K5900 Constipation, unspecified: Secondary | ICD-10-CM | POA: Diagnosis not present

## 2018-03-12 DIAGNOSIS — N189 Chronic kidney disease, unspecified: Secondary | ICD-10-CM | POA: Diagnosis not present

## 2018-03-12 DIAGNOSIS — C50412 Malignant neoplasm of upper-outer quadrant of left female breast: Secondary | ICD-10-CM | POA: Diagnosis not present

## 2018-03-12 DIAGNOSIS — Z7951 Long term (current) use of inhaled steroids: Secondary | ICD-10-CM | POA: Diagnosis not present

## 2018-03-12 DIAGNOSIS — D509 Iron deficiency anemia, unspecified: Secondary | ICD-10-CM | POA: Diagnosis not present

## 2018-03-12 DIAGNOSIS — Z9012 Acquired absence of left breast and nipple: Secondary | ICD-10-CM | POA: Diagnosis not present

## 2018-03-12 DIAGNOSIS — K219 Gastro-esophageal reflux disease without esophagitis: Secondary | ICD-10-CM | POA: Diagnosis not present

## 2018-03-12 DIAGNOSIS — D649 Anemia, unspecified: Secondary | ICD-10-CM | POA: Diagnosis not present

## 2018-03-12 DIAGNOSIS — Z79811 Long term (current) use of aromatase inhibitors: Secondary | ICD-10-CM | POA: Diagnosis not present

## 2018-03-12 DIAGNOSIS — Z170 Estrogen receptor positive status [ER+]: Secondary | ICD-10-CM | POA: Diagnosis not present

## 2018-03-12 DIAGNOSIS — M329 Systemic lupus erythematosus, unspecified: Secondary | ICD-10-CM | POA: Diagnosis not present

## 2018-03-12 DIAGNOSIS — Z7983 Long term (current) use of bisphosphonates: Secondary | ICD-10-CM | POA: Diagnosis not present

## 2018-03-12 DIAGNOSIS — Z853 Personal history of malignant neoplasm of breast: Secondary | ICD-10-CM | POA: Diagnosis not present

## 2018-03-12 DIAGNOSIS — I129 Hypertensive chronic kidney disease with stage 1 through stage 4 chronic kidney disease, or unspecified chronic kidney disease: Secondary | ICD-10-CM | POA: Diagnosis not present

## 2018-03-12 DIAGNOSIS — Z87891 Personal history of nicotine dependence: Secondary | ICD-10-CM | POA: Diagnosis not present

## 2018-03-12 DIAGNOSIS — E1122 Type 2 diabetes mellitus with diabetic chronic kidney disease: Secondary | ICD-10-CM | POA: Diagnosis not present

## 2018-03-12 DIAGNOSIS — R252 Cramp and spasm: Secondary | ICD-10-CM | POA: Diagnosis not present

## 2018-03-12 DIAGNOSIS — Z7982 Long term (current) use of aspirin: Secondary | ICD-10-CM | POA: Diagnosis not present

## 2018-03-12 DIAGNOSIS — M79606 Pain in leg, unspecified: Secondary | ICD-10-CM | POA: Diagnosis not present

## 2018-03-13 ENCOUNTER — Ambulatory Visit (INDEPENDENT_AMBULATORY_CARE_PROVIDER_SITE_OTHER): Payer: Medicare Other | Admitting: Vascular Surgery

## 2018-03-13 ENCOUNTER — Encounter (INDEPENDENT_AMBULATORY_CARE_PROVIDER_SITE_OTHER): Payer: Self-pay | Admitting: Vascular Surgery

## 2018-03-13 ENCOUNTER — Other Ambulatory Visit: Payer: Self-pay

## 2018-03-13 VITALS — BP 191/45 | HR 58 | Resp 12 | Ht 67.5 in | Wt 203.0 lb

## 2018-03-13 DIAGNOSIS — Z7982 Long term (current) use of aspirin: Secondary | ICD-10-CM

## 2018-03-13 DIAGNOSIS — I701 Atherosclerosis of renal artery: Secondary | ICD-10-CM

## 2018-03-13 DIAGNOSIS — I739 Peripheral vascular disease, unspecified: Secondary | ICD-10-CM

## 2018-03-13 DIAGNOSIS — I150 Renovascular hypertension: Secondary | ICD-10-CM

## 2018-03-13 DIAGNOSIS — I6523 Occlusion and stenosis of bilateral carotid arteries: Secondary | ICD-10-CM | POA: Diagnosis not present

## 2018-03-13 DIAGNOSIS — Z79899 Other long term (current) drug therapy: Secondary | ICD-10-CM

## 2018-03-13 DIAGNOSIS — Z7902 Long term (current) use of antithrombotics/antiplatelets: Secondary | ICD-10-CM

## 2018-03-13 DIAGNOSIS — M79605 Pain in left leg: Secondary | ICD-10-CM

## 2018-03-13 DIAGNOSIS — M79604 Pain in right leg: Secondary | ICD-10-CM

## 2018-03-13 DIAGNOSIS — Z87891 Personal history of nicotine dependence: Secondary | ICD-10-CM

## 2018-03-13 MED ORDER — MAGNESIUM OXIDE 400 (241.3 MG) MG PO TABS: 400.0000 mg | ORAL_TABLET | Freq: Every day | ORAL | 3 refills | Status: DC

## 2018-03-13 NOTE — Progress Notes (Signed)
MRN : [id number]  Jennifer Carrillo is a 85 y.o. (03-Jul-1933) female who presents with chief complaint of  Chief Complaint  Patient presents with  . Follow-up  .  History of Present Illness:   She has done well with her renal artery stent placement.  Her BP has decreased substantially.  The patient denies interval amaurosis fugax. There is no recent or interval TIA symptoms or focal motor deficits. There is no prior documented CVA.  The patient is taking enteric-coated aspirin 81 mg daily.  There is no history of migraine headaches. There is no history of seizures.  The patient has a history of coronary artery disease, no recent episodes of angina or shortness of breath. The patient denies PAD or claudication symptoms. There is a history of hyperlipidemia which is being treated with a statin.    CT angiogram is reviewed by me personally and shows >90% stenosis consistent with calcified plaque at the origin of the LICA internal carotid artery.   Current Meds  Medication Sig  . ACCU-CHEK FASTCLIX LANCETS MISC by Does not apply route 3 (three) times daily after meals.  Marland Kitchen acetaminophen (TYLENOL) 500 MG tablet Take 1,000 mg by mouth every 6 (six) hours as needed for moderate pain or headache.  Marland Kitchen amLODipine (NORVASC) 10 MG tablet Take 1 tablet (10 mg total) by mouth daily.  Marland Kitchen anastrozole (ARIMIDEX) 1 MG tablet Take 1 mg by mouth daily.  Marland Kitchen aspirin 81 MG tablet Take 81 mg by mouth daily.  . Calcium Citrate-Vitamin D (CALCIUM + D PO) Take 1 tablet by mouth daily.  . carvedilol (COREG) 25 MG tablet Take 25 mg by mouth 2 (two) times daily with a meal.  . clopidogrel (PLAVIX) 75 MG tablet Take 1 tablet (75 mg total) by mouth daily.  . Cranberry-Vitamin C-Vitamin E (CRANBERRY PLUS VITAMIN C PO) Take 1 tablet by mouth every other day.  . digoxin (LANOXIN) 0.125 MG tablet TAKE 1 TABLET (0.125 MG TOTAL) BY MOUTH DAILY.  Marland Kitchen docusate sodium (COLACE) 100 MG capsule Take 100 mg by mouth daily as  needed for mild constipation.  Marland Kitchen donepezil (ARICEPT) 10 MG tablet Take one tab po qhs for memory (Patient taking differently: Take 10 mg by mouth at bedtime. Take one tab po qhs for memory)  . Ergocalciferol (VITAMIN D2) 10 MCG (400 UNIT) TABS Take 400 Units by mouth daily.  . ferrous sulfate 325 (65 FE) MG EC tablet Take 325 mg by mouth every other day.   Marland Kitchen glucose blood (ACCU-CHEK SMARTVIEW) test strip 3 each by Other route 3 (three) times daily after meals. Use as instructed  . hydrALAZINE (APRESOLINE) 25 MG tablet TAKE 1 TABLET BY MOUTH TWICE A DAY (Patient taking differently: Take 25 mg by mouth 2 (two) times daily. )  . hydrochlorothiazide (MICROZIDE) 12.5 MG capsule Take 12.5 mg by mouth daily.  Marland Kitchen ibandronate (BONIVA) 150 MG tablet Take 150 mg by mouth every 30 (thirty) days. Take in the morning with a full glass of water, on an empty stomach, and do not take anything else by mouth or lie down for the next 30 min.  . insulin NPH-regular Human (70-30) 100 UNIT/ML injection Inject 30 Units into the skin 2 (two) times daily.  . pantoprazole (PROTONIX) 40 MG tablet Take 1 tablet (40 mg total) by mouth daily.  Marland Kitchen PROAIR HFA 108 (90 Base) MCG/ACT inhaler Inhale 2 puffs into the lungs every 6 (six) hours as needed for wheezing or shortness of breath.   Marland Kitchen  rOPINIRole (REQUIP) 0.5 MG tablet TAKE 1 TABLET BY MOUTH AT BEDTIME (Patient taking differently: Take 0.5 mg by mouth at bedtime. )  . telmisartan (MICARDIS) 80 MG tablet Take 80 mg by mouth daily.  . vitamin C (ASCORBIC ACID) 500 MG tablet Take 500 mg by mouth daily.     Past Medical History:  Diagnosis Date  . Arthritis   . Asthma   . Calf pain   . Cancer [facility]) 2016   left breast  . COPD (chronic obstructive pulmonary disease) ([place])   . Diabetes ([place])   . Discoid lupus   . Gastro-esophageal reflux   . Glaucoma   . Hyperlipemia   . Hypertension   . Iron deficiency anemia   . Joint pain   . Leg swelling   . Osteoarthritis   . PVD  (peripheral vascular disease) ([place])   . Sinus problem   . Sleep apnea    No CPAP/ Can't tolerate  . Stroke [facility]) 1987    Past Surgical History:  Procedure Laterality Date  . BREAST SURGERY Left    left lumpectomy  . CAROTID ENDARTERECTOMY Right   . CATARACT EXTRACTION Right 2013  . CORONARY STENT PLACEMENT     L. L. E.  . EYE SURGERY Bilateral    cataract  . LOWER EXTREMITY ANGIOGRAPHY Right 01/22/2018   Procedure: LOWER EXTREMITY ANGIOGRAPHY;  Surgeon: Katha Cabal, MD;  Location: [place] [facility];  Service: Cardiovascular;  Laterality: Right;  . RENAL ANGIOGRAPHY Left 02/26/2018   Procedure: RENAL ANGIOGRAPHY;  Surgeon: Katha Cabal, MD;  Location: [place] [facility];  Service: Cardiovascular;  Laterality: Left;  . STENT PLACEMENT VASCULAR ([facility] HX) Left    stent placement on LLE  . TUBAL LIGATION      Social History Social History   Tobacco Use  . Smoking status: Former Smoker    Last attempt to quit: 1987    Years since quitting: 33.1  . Smokeless tobacco: Never Used  . Tobacco comment: quit 31 years  Substance Use Topics  . Alcohol use: No  . Drug use: No    Family History Family History  Problem Relation Age of Onset  . Heart disease Mother   . Diabetes Other   . Hypertension Other   . Diabetes Sister   . Lung cancer Brother   . Leukemia Daughter   . Bladder Cancer Neg Hx   . Kidney disease Neg Hx   . Prostate cancer Neg Hx     Allergies  Allergen Reactions  . Benazepril Nausea Only     REVIEW OF SYSTEMS (Negative unless checked)  Constitutional: [] Weight loss  [] Fever  [] Chills Cardiac: [] Chest pain   [] Chest pressure   [] Palpitations   [] Shortness of breath when laying flat   [] Shortness of breath with exertion. Vascular:  [] Pain in legs with walking   [] Pain in legs at rest  [] History of DVT   [] Phlebitis   [] Swelling in legs   [] Varicose veins   [] Non-healing ulcers Pulmonary:   [] Uses home oxygen   [] Productive cough    [] Hemoptysis   [] Wheeze  [] COPD   [] Asthma Neurologic:  [] Dizziness   [] Seizures   [] History of stroke   [] History of TIA  [] Aphasia   [] Vissual changes   [] Weakness or numbness in arm   [] Weakness or numbness in leg Musculoskeletal:   [] Joint swelling   [] Joint pain   [] Low back pain Hematologic:  [] Easy bruising  [] Easy bleeding   [] Hypercoagulable state   []   Anemic Gastrointestinal:  [] Diarrhea   [] Vomiting  [] Gastroesophageal reflux/heartburn   [] Difficulty swallowing. Genitourinary:  [] Chronic kidney disease   [] Difficult urination  [] Frequent urination   [] Blood in urine Skin:  [] Rashes   [] Ulcers  Psychological:  [] History of anxiety   []  History of major depression.  Physical Examination  Vitals:   03/13/18 0852  BP: (!) 191/45  Pulse: (!) 58  Resp: 12  Weight: 203 lb (92.1 kg)  Height: 5' 7.5" (1.715 m)   Body mass index is 31.33 kg/m. Gen: WD/WN, NAD Head: /AT, No temporalis wasting.  Ear/Nose/Throat: Hearing grossly intact, nares w/o erythema or drainage Eyes: PER, EOMI, sclera nonicteric.  Neck: Supple, no large masses.   Pulmonary:  Good air movement, no audible wheezing bilaterally, no use of accessory muscles.  Cardiac: RRR, no JVD Vascular:  Left arotid bruit Vessel Right Left  Radial Palpable Palpable  Brachial Palpable Palpable  Carotid Palpable Palpable  Gastrointestinal: Non-distended. No guarding/no peritoneal signs.  Musculoskeletal: M/S 5/5 throughout.  No deformity or atrophy.  Neurologic: CN 2-12 intact. Symmetrical.  Speech is fluent. Motor exam as listed above. Psychiatric: Judgment intact, Mood & affect appropriate for pt's clinical situation. Dermatologic: No rashes or ulcers noted.  No changes consistent with cellulitis. Lymph : No lichenification or skin changes of chronic lymphedema.  CBC Lab Results  Component Value Date   WBC 9.0 04/10/2017   HGB 10.2 (L) 04/10/2017   HCT 32.3 (L) 04/10/2017   MCV 75 (L) 04/10/2017   PLT 390 (H)  04/10/2017    BMET    Component Value Date/Time   NA 141 12/17/2017 1032   NA 142 05/20/2013 0509   K 4.9 12/17/2017 1032   K 3.7 05/20/2013 0509   CL 108 (H) 12/17/2017 1032   CL 108 (H) 05/20/2013 0509   CO2 21 12/17/2017 1032   CO2 30 05/20/2013 0509   GLUCOSE 150 (H) 12/17/2017 1032   GLUCOSE 120 (H) 04/27/2016 1404   GLUCOSE 108 (H) 05/20/2013 0509   BUN 30 (H) 02/26/2018 1209   BUN 31 (H) 12/17/2017 1032   BUN 13 05/20/2013 0509   CREATININE 1.29 (H) 02/26/2018 1209   CREATININE 0.92 05/20/2013 0509   CALCIUM 9.4 12/17/2017 1032   CALCIUM 8.9 05/20/2013 0509   GFRNONAA 39 (L) 02/26/2018 1209   GFRNONAA >60 05/20/2013 0509   GFRAA 45 (L) 02/26/2018 1209   GFRAA >60 05/20/2013 0509   Estimated Creatinine Clearance: 39.5 mL/min (A) (by C-G formula based on SCr of 1.29 mg/dL (H)).  COAG No results found for: INR, PROTIME  Radiology Vas [place] Abi With/wo Tbi  Result Date: 02/13/2018 LOWER EXTREMITY DOPPLER STUDY Indications: Peripheral artery disease.  Vascular Interventions: 01/22/18: Bilateral CIA stents;. Comparison Study: Angiogram on 01/22/18: Right SFA occlusion with significant                   right TP trunk & peroneal artery stenosis Performing Technologist: Blondell Reveal RT, RDMS, RVT  Examination Guidelines: A complete evaluation includes at minimum, Doppler waveform signals and systolic blood pressure reading at the level of bilateral brachial, anterior tibial, and posterior tibial arteries, when vessel segments are accessible. Bilateral testing is considered an integral part of a complete examination. Photoelectric Plethysmograph (PPG) waveforms and toe systolic pressure readings are included as required and additional duplex testing as needed. Limited examinations for reoccurring indications may be performed as noted.  ABI Findings: +---------+------------------+-----+------------+----------------+ Right    Rt Pressure (mmHg)IndexWaveform    Comment           +---------+------------------+-----+------------+----------------+  Brachial 198                                                 +---------+------------------+-----+------------+----------------+ ATA                             monophasic  Non-compressible +---------+------------------+-----+------------+----------------+ PTA                             not detected                 +---------+------------------+-----+------------+----------------+ PERO     187               0.94 monophasic                   +---------+------------------+-----+------------+----------------+ Great Toe69                0.35 Abnormal                     +---------+------------------+-----+------------+----------------+ +---------+------------------+-----+--------+-------+ Left     Lt Pressure (mmHg)IndexWaveformComment +---------+------------------+-----+--------+-------+ Brachial 196                                    +---------+------------------+-----+--------+-------+ ATA      143               0.72 biphasic        +---------+------------------+-----+--------+-------+ PTA      131               0.66 biphasic        +---------+------------------+-----+--------+-------+ Great Toe86                0.43 Abnormal        +---------+------------------+-----+--------+-------+ +-------+----------------+-----------+------------+------------+ ABI/TBIToday's ABI     Today's TBIPrevious ABIPrevious TBI +-------+----------------+-----------+------------+------------+ Right  Non-compressible0.35       0.38        0.39         +-------+----------------+-----------+------------+------------+ Left   0.72            0.43       0.65        0.46         +-------+----------------+-----------+------------+------------+ Unable to adequately compare to the previous right ABI due to non-compressible vessels. Mild increase in the left ABI when compared to the previous exam on 01/16/18.   Summary: Right: Resting right ankle-brachial index indicates noncompressible right lower extremity arteries.The right toe-brachial index is abnormal. ABIs are unreliable. Left: Resting left ankle-brachial index indicates moderate left lower extremity arterial disease however ankle pressures may be unreliable due to possible vessel calcification. The left toe-brachial index is abnormal.  *See table(s) above for measurements and observations.  Electronically signed by Hortencia Pilar MD on 02/13/2018 at 2:33:16 PM.    Final    Vas US Renal Artery Duplex  Result Date: 02/13/2018 ABDOMINAL VISCERAL Indications: Renal artery stenosis Limitations: Air/bowel gas and obesity. Comparison Study: Angiogram on 01/22/18: 60-70% right renal artery stenosis &                   >80% left renal artery stenosis; Bilateral CIA stents Performing Technologist: Blondell Reveal RT, RDMS, RVT  Examination  Guidelines: A complete evaluation includes B-mode imaging, spectral Doppler, color Doppler, and power Doppler as needed of all accessible portions of each vessel. Bilateral testing is considered an integral part of a complete examination. Limited examinations for reoccurring indications may be performed as noted.  Duplex Findings: +----------+--------+--------+------+--------+           PSV cm/sEDV cm/sPlaqueComments +----------+--------+--------+------+--------+ Aorta Prox   90                          +----------+--------+--------+------+--------+ Aorta Mid    80                          +----------+--------+--------+------+--------+ Bilateral common iliac arteries were not visualized due to overlying bowel gas.  +------------------+--------+--------+-------+ Right Renal ArteryPSV cm/sEDV cm/sComment +------------------+--------+--------+-------+ Proximal            320                   +------------------+--------+--------+-------+ Mid                 147                    +------------------+--------+--------+-------+ Distal              182                   +------------------+--------+--------+-------+ +-----------------+--------+--------+-------+ Left Renal ArteryPSV cm/sEDV cm/sComment +-----------------+--------+--------+-------+ Proximal           408                   +-----------------+--------+--------+-------+ Mid                138                   +-----------------+--------+--------+-------+ Distal              88                   +-----------------+--------+--------+-------+  +------------------+-----+------------------+-----+ Right Kidney           Left Kidney             +------------------+-----+------------------+-----+ RAR (manual)      4.0  RAR (manual)      5.1   +------------------+-----+------------------+-----+ Kidney length (cm)11.30Kidney length (cm)11.80 +------------------+-----+------------------+-----+  Summary: Renal:  Right: Evidence of a greater than 60% stenosis of the right renal        artery. Normal size right kidney. RRV flow present. Left:  Evidence of a > 60% stenosis in the left renal artery. Normal        size of left kidney. LRV flow present.         Doppler velocities appear consistent with known bilateral        renal artery stenosis noted on the previous angiogram on        01/22/18.  *See table(s) above for measurements and observations.  Diagnosing physician: Hortencia Pilar MD  Electronically signed by Hortencia Pilar MD on 02/13/2018 at 2:33:11 PM.    Final     Assessment/Plan 1. Renal artery stenosis [facility]) Patient doing well s/p intervention   Continue to monitor with duplex.  - VAS US RENAL ARTERY DUPLEX; Future  2. Bilateral carotid artery stenosis Recommend:  The patient remains asymptomatic with respect to the carotid stenosis.  However, the patient has now progressed and has a lesion the  is >75%.  Patient's CT angiography of the carotid arteries confirms >75% left ICA  stenosis.  The anatomical considerations support surgery over stenting.  This was discussed in detail with the patient.  The patient does indeed need surgery, therefore, cardiac clearance will be arranged. Once cleared the patient will be scheduled for surgery.  The risks, benefits and alternative therapies were reviewed in detail with the patient.  All questions were answered.  The patient agrees to proceed with surgery of the left carotid artery.  Continue antiplatelet therapy as prescribed. Continue management of CAD, HTN and Hyperlipidemia. Healthy heart diet, encouraged exercise at least 4 times per week.    3. PAD (peripheral artery disease) (HCC)  Recommend:  The patient has evidence of atherosclerosis of the lower extremities with claudication.  The patient does not voice lifestyle limiting changes at this point in time.  Noninvasive studies do not suggest clinically significant change.  No invasive studies, angiography or surgery at this time The patient should continue walking and begin a more formal exercise program.  The patient should continue antiplatelet therapy and aggressive treatment of the lipid abnormalities  No changes in the patient's medications at this time  The patient should continue wearing graduated compression socks 10-15 mmHg strength to control the mild edema.   - VAS [place] ABI WITH/WO TBI; Future  4. Pain in both lower extremities See #3  5. Renovascular hypertension Improved  See #1    Hortencia Pilar, MD  03/13/2018 9:03 AM

## 2018-03-24 DIAGNOSIS — I208 Other forms of angina pectoris: Secondary | ICD-10-CM | POA: Diagnosis not present

## 2018-03-24 DIAGNOSIS — R0602 Shortness of breath: Secondary | ICD-10-CM | POA: Diagnosis not present

## 2018-03-24 DIAGNOSIS — Z01818 Encounter for other preprocedural examination: Secondary | ICD-10-CM | POA: Diagnosis not present

## 2018-03-26 DIAGNOSIS — D649 Anemia, unspecified: Secondary | ICD-10-CM | POA: Diagnosis not present

## 2018-03-28 ENCOUNTER — Encounter: Payer: Self-pay | Admitting: Adult Health

## 2018-03-28 ENCOUNTER — Ambulatory Visit (INDEPENDENT_AMBULATORY_CARE_PROVIDER_SITE_OTHER): Payer: Medicare Other | Admitting: Adult Health

## 2018-03-28 ENCOUNTER — Other Ambulatory Visit: Payer: Self-pay

## 2018-03-28 VITALS — BP 170/60 | HR 69 | Temp 98.3°F | Resp 18 | Ht 67.0 in | Wt 202.0 lb

## 2018-03-28 DIAGNOSIS — J3089 Other allergic rhinitis: Secondary | ICD-10-CM

## 2018-03-28 DIAGNOSIS — I10 Essential (primary) hypertension: Secondary | ICD-10-CM

## 2018-03-28 DIAGNOSIS — J0110 Acute frontal sinusitis, unspecified: Secondary | ICD-10-CM | POA: Diagnosis not present

## 2018-03-28 MED ORDER — AMOXICILLIN-POT CLAVULANATE 875-125 MG PO TABS: 1.0000 | ORAL_TABLET | Freq: Two times a day (BID) | ORAL | 0 refills | Status: DC

## 2018-03-28 MED ORDER — MONTELUKAST SODIUM 10 MG PO TABS: 10.0000 mg | ORAL_TABLET | Freq: Every day | ORAL | 3 refills | Status: DC

## 2018-03-28 NOTE — Progress Notes (Signed)
[facility] [place], [place] [address]  Internal MEDICINE  Office Visit Note  Patient Name: Jennifer Carrillo  [id number]  [id number]  Date of Service: 04/08/2018  Chief Complaint  Patient presents with  . Nasal Congestion  . Headache     HPI Pt is here for a sick visit. Pt reports 3 days of nasal congestion, and headache.  She reports she woke up sweating. She denies any chills, or fever.  She has had a decreased appetite. Denies diarrhea, vomiting, dizziness or nausea.       Current Medication:  Outpatient Encounter Medications as of 03/28/2018  Medication Sig Note  . ACCU-CHEK FASTCLIX LANCETS MISC by Does not apply route 3 (three) times daily after meals.   Marland Kitchen acetaminophen (TYLENOL) 500 MG tablet Take 1,000 mg by mouth every 6 (six) hours as needed for moderate pain or headache.   . anastrozole (ARIMIDEX) 1 MG tablet Take 1 mg by mouth daily.   Marland Kitchen aspirin 81 MG tablet Take 81 mg by mouth daily. 03/27/2018: On hold due to upcoming procedure.  . calcium carbonate (OSCAL) 1500 (600 Ca) MG TABS tablet Take 600 mg by mouth daily.   . Cranberry-Vitamin C-Vitamin E (CRANBERRY PLUS VITAMIN C PO) Take 1 tablet by mouth every other day.   . docusate sodium (COLACE) 100 MG capsule Take 200 mg by mouth at bedtime.    . Ergocalciferol (VITAMIN D2) 10 MCG (400 UNIT) TABS Take 400 Units by mouth every other day.    . ferrous sulfate 325 (65 FE) MG EC tablet Take 325 mg by mouth 4 (four) times a week.    Marland Kitchen glucose blood (ACCU-CHEK SMARTVIEW) test strip 3 each by Other route 3 (three) times daily after meals. Use as instructed   . hydrochlorothiazide (HYDRODIURIL) 12.5 MG tablet Take 12.5 mg by mouth daily.   Marland Kitchen ibandronate (BONIVA) 150 MG tablet Take 150 mg by mouth every 30 (thirty) days. Take in the morning with a full glass of water, on an empty stomach, and do not take anything else by mouth or lie down for the next 30 min.   . insulin NPH-regular Human (70-30)  100 UNIT/ML injection Inject 30 Units into the skin 2 (two) times daily.   . isosorbide mononitrate (IMDUR) 30 MG 24 hr tablet Take 30 mg by mouth daily at 12 noon.   . magnesium oxide (MAG-OX) 400 (241.3 Mg) MG tablet Take 1 tablet (400 mg total) by mouth daily. (Patient taking differently: Take 400 mg by mouth daily at 12 noon. )   . PROAIR HFA 108 (90 Base) MCG/ACT inhaler Inhale 2 puffs into the lungs every 6 (six) hours as needed for wheezing or shortness of breath.    . vitamin C (ASCORBIC ACID) 500 MG tablet Take 500 mg by mouth daily.    . [DISCONTINUED] amLODipine (NORVASC) 10 MG tablet Take 1 tablet (10 mg total) by mouth daily.   . [DISCONTINUED] atorvastatin (LIPITOR) 10 MG tablet Take 1 tablet (10 mg total) by mouth daily. (Patient taking differently: Take 10 mg by mouth daily with supper. )   . [DISCONTINUED] clopidogrel (PLAVIX) 75 MG tablet Take 1 tablet (75 mg total) by mouth daily. (Patient taking differently: Take 75 mg by mouth daily with supper. )   . [DISCONTINUED] digoxin (LANOXIN) 0.125 MG tablet TAKE 1 TABLET (0.125 MG TOTAL) BY MOUTH DAILY.   . [DISCONTINUED] donepezil (ARICEPT) 10 MG tablet Take one tab po qhs for memory (Patient taking  differently: Take 10 mg by mouth at bedtime. Take one tab po qhs for memory)   . [DISCONTINUED] hydrALAZINE (APRESOLINE) 25 MG tablet TAKE 1 TABLET BY MOUTH TWICE A DAY (Patient taking differently: Take 25 mg by mouth 2 (two) times daily. )   . [DISCONTINUED] pantoprazole (PROTONIX) 40 MG tablet Take 1 tablet (40 mg total) by mouth daily.   . [DISCONTINUED] rOPINIRole (REQUIP) 0.5 MG tablet TAKE 1 TABLET BY MOUTH AT BEDTIME (Patient taking differently: Take 0.5 mg by mouth at bedtime. )   . [DISCONTINUED] telmisartan (MICARDIS) 80 MG tablet Take 80 mg by mouth daily.   Marland Kitchen amoxicillin-clavulanate (AUGMENTIN) 875-125 MG tablet Take 1 tablet by mouth 2 (two) times daily.   . [DISCONTINUED] montelukast (SINGULAIR) 10 MG tablet Take 1 tablet (10 mg  total) by mouth at bedtime.    No facility-administered encounter medications on file as of 03/28/2018.       Medical History: Past Medical History:  Diagnosis Date  . Arthritis   . Asthma   . Calf pain   . Cancer [facility]) 2016   left breast  . COPD (chronic obstructive pulmonary disease) ([place])   . Diabetes ([place])   . Discoid lupus   . Gastro-esophageal reflux   . Glaucoma   . Hyperlipemia   . Hypertension   . Iron deficiency anemia   . Joint pain   . Leg swelling   . Osteoarthritis   . PVD (peripheral vascular disease) ([place])   . Sinus problem   . Sleep apnea    No CPAP/ Can't tolerate  . Stroke ([place]) 1987     Vital Signs: BP (!) 170/60   Pulse 69   Temp 98.3 F (36.8 C)   Resp 18   Ht 5\' 7"  (1.702 m)   Wt 202 lb (91.6 kg)   SpO2 98%   BMI 31.64 kg/m    Review of Systems  Constitutional: Negative for chills, fatigue and unexpected weight change.  HENT: Positive for sinus pressure. Negative for congestion, rhinorrhea, sneezing and sore throat.   Eyes: Negative for photophobia, pain and redness.  Respiratory: Negative for cough, chest tightness and shortness of breath.   Cardiovascular: Negative for chest pain and palpitations.  Gastrointestinal: Negative for abdominal pain, constipation, diarrhea, nausea and vomiting.  Endocrine: Negative.   Genitourinary: Negative for dysuria and frequency.  Musculoskeletal: Negative for arthralgias, back pain, joint swelling and neck pain.  Skin: Negative for rash.  Allergic/Immunologic: Negative.   Neurological: Positive for headaches. Negative for tremors and numbness.  Hematological: Negative for adenopathy. Does not bruise/bleed easily.  Psychiatric/Behavioral: Negative for behavioral problems and sleep disturbance. The patient is not nervous/anxious.     Physical Exam Vitals signs and nursing note reviewed.  Constitutional:      General: She is not in acute distress.    Appearance: She is well-developed. She is not  diaphoretic.  HENT:     Head: Normocephalic and atraumatic.     Mouth/Throat:     Pharynx: No oropharyngeal exudate.  Eyes:     Pupils: Pupils are equal, round, and reactive to light.  Neck:     Musculoskeletal: Normal range of motion and neck supple.     Thyroid: No thyromegaly.     Vascular: No JVD.     Trachea: No tracheal deviation.  Cardiovascular:     Rate and Rhythm: Normal rate and regular rhythm.     Heart sounds: Normal heart sounds. No murmur. No friction rub. No gallop.  Pulmonary:     Effort: Pulmonary effort is normal. No respiratory distress.     Breath sounds: Normal breath sounds. No wheezing or rales.  Chest:     Chest wall: No tenderness.  Abdominal:     Palpations: Abdomen is soft.     Tenderness: There is no abdominal tenderness. There is no guarding.  Musculoskeletal: Normal range of motion.  Lymphadenopathy:     Cervical: No cervical adenopathy.  Skin:    General: Skin is warm and dry.  Neurological:     Mental Status: She is alert and oriented to person, place, and time.     Cranial Nerves: No cranial nerve deficit.  Psychiatric:        Behavior: Behavior normal.        Thought Content: Thought content normal.        Judgment: Judgment normal.    Assessment/Plan: 1. Acute non-recurrent frontal sinusitis Pt given course of Augmentin.  Advised patient to take entire course of antibiotics as prescribed with food. Pt should return to clinic in 7-10 days if symptoms fail to improve or new symptoms develop.  - amoxicillin-clavulanate (AUGMENTIN) 875-125 MG tablet; Take 1 tablet by mouth 2 (two) times daily.  Dispense: 14 tablet; Refill: 0  2. Seasonal allergic rhinitis due to other allergic trigger Stable, encouraged patient to take her medications as prescribed.   3. Essential hypertension Pt has slightly elevated bp at this visit. BP rechecked and found to be 160/70 Continue to follow at future visits.    General Counseling: norita meigs  understanding of the findings of todays visit and agrees with plan of treatment. I have discussed any further diagnostic evaluation that may be needed or ordered today. We also reviewed her medications today. she has been encouraged to call the office with any questions or concerns that should arise related to todays visit.   No orders of the defined types were placed in this encounter.   Meds ordered this encounter  Medications  . amoxicillin-clavulanate (AUGMENTIN) 875-125 MG tablet    Sig: Take 1 tablet by mouth 2 (two) times daily.    Dispense:  14 tablet    Refill:  0  . DISCONTD: montelukast (SINGULAIR) 10 MG tablet    Sig: Take 1 tablet (10 mg total) by mouth at bedtime.    Dispense:  30 tablet    Refill:  3    Time spent: 25 Minutes  This patient was seen by Orson Gear AGNP-C in Collaboration with Dr [name] as a part of collaborative care agreement.  Kendell Bane AGNP-C Internal Medicine

## 2018-03-31 DIAGNOSIS — I208 Other forms of angina pectoris: Secondary | ICD-10-CM | POA: Diagnosis present

## 2018-04-01 ENCOUNTER — Other Ambulatory Visit: Payer: Self-pay | Admitting: Adult Health

## 2018-04-01 DIAGNOSIS — I10 Essential (primary) hypertension: Secondary | ICD-10-CM

## 2018-04-02 ENCOUNTER — Other Ambulatory Visit: Payer: Self-pay | Admitting: Adult Health

## 2018-04-02 DIAGNOSIS — I482 Chronic atrial fibrillation, unspecified: Secondary | ICD-10-CM

## 2018-04-02 DIAGNOSIS — C50512 Malignant neoplasm of lower-outer quadrant of left female breast: Secondary | ICD-10-CM

## 2018-04-02 DIAGNOSIS — J3089 Other allergic rhinitis: Secondary | ICD-10-CM

## 2018-04-02 DIAGNOSIS — I10 Essential (primary) hypertension: Secondary | ICD-10-CM

## 2018-04-02 DIAGNOSIS — Z170 Estrogen receptor positive status [ER+]: Principal | ICD-10-CM

## 2018-04-02 MED ORDER — DIGOXIN 125 MCG PO TABS: 0.1250 mg | ORAL_TABLET | Freq: Every day | ORAL | 3 refills | Status: DC

## 2018-04-02 MED ORDER — AMLODIPINE BESYLATE 10 MG PO TABS: 10.0000 mg | ORAL_TABLET | Freq: Every day | ORAL | 3 refills | Status: DC

## 2018-04-02 MED ORDER — ROPINIROLE HCL 0.5 MG PO TABS: 0.5000 mg | ORAL_TABLET | Freq: Every day | ORAL | 2 refills | Status: DC

## 2018-04-02 MED ORDER — ATORVASTATIN CALCIUM 10 MG PO TABS: 10.0000 mg | ORAL_TABLET | Freq: Every day | ORAL | 3 refills | Status: DC

## 2018-04-02 MED ORDER — DONEPEZIL HCL 10 MG PO TABS: 10.0000 mg | ORAL_TABLET | Freq: Every day | ORAL | 2 refills | Status: DC

## 2018-04-02 MED ORDER — MONTELUKAST SODIUM 10 MG PO TABS: 10.0000 mg | ORAL_TABLET | Freq: Every day | ORAL | 3 refills | Status: DC

## 2018-04-02 MED ORDER — CLOPIDOGREL BISULFATE 75 MG PO TABS: 75.0000 mg | ORAL_TABLET | Freq: Every day | ORAL | 2 refills | Status: DC

## 2018-04-03 ENCOUNTER — Ambulatory Visit: Payer: Self-pay | Admitting: Adult Health

## 2018-04-03 ENCOUNTER — Other Ambulatory Visit: Payer: Self-pay | Admitting: Adult Health

## 2018-04-03 DIAGNOSIS — I10 Essential (primary) hypertension: Secondary | ICD-10-CM

## 2018-04-03 MED ORDER — TELMISARTAN 80 MG PO TABS: 80.0000 mg | ORAL_TABLET | Freq: Every day | ORAL | 3 refills | Status: DC

## 2018-04-03 MED ORDER — PANTOPRAZOLE SODIUM 40 MG PO TBEC: 40.0000 mg | DELAYED_RELEASE_TABLET | Freq: Every day | ORAL | 1 refills | Status: DC

## 2018-04-04 ENCOUNTER — Other Ambulatory Visit: Payer: Self-pay | Admitting: Adult Health

## 2018-04-04 MED ORDER — ATORVASTATIN CALCIUM 10 MG PO TABS: 10.0000 mg | ORAL_TABLET | Freq: Every day | ORAL | 3 refills | Status: DC

## 2018-04-07 ENCOUNTER — Other Ambulatory Visit: Payer: Self-pay

## 2018-04-07 DIAGNOSIS — I10 Essential (primary) hypertension: Secondary | ICD-10-CM

## 2018-04-07 MED ORDER — HYDRALAZINE HCL 25 MG PO TABS: 25.0000 mg | ORAL_TABLET | Freq: Two times a day (BID) | ORAL | 1 refills | Status: DC

## 2018-04-14 ENCOUNTER — Other Ambulatory Visit (INDEPENDENT_AMBULATORY_CARE_PROVIDER_SITE_OTHER): Payer: Self-pay | Admitting: Vascular Surgery

## 2018-04-22 ENCOUNTER — Ambulatory Visit: Admission: RE | Admit: 2018-04-22 | Payer: Medicare Other | Source: Home / Self Care | Admitting: Internal Medicine

## 2018-04-22 ENCOUNTER — Encounter: Admission: RE | Payer: Self-pay | Source: Home / Self Care

## 2018-04-22 SURGERY — LEFT HEART CATH AND CORONARY ANGIOGRAPHY
Anesthesia: Moderate Sedation | Laterality: Left

## 2018-04-25 DIAGNOSIS — Z170 Estrogen receptor positive status [ER+]: Secondary | ICD-10-CM | POA: Diagnosis not present

## 2018-04-25 DIAGNOSIS — C50512 Malignant neoplasm of lower-outer quadrant of left female breast: Secondary | ICD-10-CM | POA: Diagnosis not present

## 2018-04-25 DIAGNOSIS — D649 Anemia, unspecified: Secondary | ICD-10-CM | POA: Diagnosis not present

## 2018-04-28 DIAGNOSIS — I208 Other forms of angina pectoris: Secondary | ICD-10-CM | POA: Diagnosis not present

## 2018-04-28 DIAGNOSIS — R0602 Shortness of breath: Secondary | ICD-10-CM | POA: Diagnosis not present

## 2018-04-28 DIAGNOSIS — I10 Essential (primary) hypertension: Secondary | ICD-10-CM | POA: Diagnosis not present

## 2018-04-28 DIAGNOSIS — Z01818 Encounter for other preprocedural examination: Secondary | ICD-10-CM | POA: Diagnosis not present

## 2018-05-02 DIAGNOSIS — C50912 Malignant neoplasm of unspecified site of left female breast: Secondary | ICD-10-CM | POA: Diagnosis not present

## 2018-05-06 ENCOUNTER — Ambulatory Visit
Admission: RE | Admit: 2018-05-06 | Discharge: 2018-05-06 | Disposition: A | Discharge status: Home / Self Care | Payer: Medicare Other | Attending: Internal Medicine | Admitting: Internal Medicine

## 2018-05-06 ENCOUNTER — Other Ambulatory Visit: Payer: Self-pay

## 2018-05-06 ENCOUNTER — Encounter
Admission: RE | Disposition: A | Discharge status: Home / Self Care | Payer: Self-pay | Source: Home / Self Care | Attending: Internal Medicine

## 2018-05-06 DIAGNOSIS — I471 Supraventricular tachycardia: Secondary | ICD-10-CM | POA: Insufficient documentation

## 2018-05-06 DIAGNOSIS — I25118 Atherosclerotic heart disease of native coronary artery with other forms of angina pectoris: Secondary | ICD-10-CM | POA: Insufficient documentation

## 2018-05-06 DIAGNOSIS — I25119 Atherosclerotic heart disease of native coronary artery with unspecified angina pectoris: Secondary | ICD-10-CM | POA: Diagnosis not present

## 2018-05-06 DIAGNOSIS — D649 Anemia, unspecified: Secondary | ICD-10-CM | POA: Diagnosis not present

## 2018-05-06 DIAGNOSIS — Z8673 Personal history of transient ischemic attack (TIA), and cerebral infarction without residual deficits: Secondary | ICD-10-CM | POA: Insufficient documentation

## 2018-05-06 DIAGNOSIS — R079 Chest pain, unspecified: Secondary | ICD-10-CM

## 2018-05-06 DIAGNOSIS — M1990 Unspecified osteoarthritis, unspecified site: Secondary | ICD-10-CM | POA: Insufficient documentation

## 2018-05-06 DIAGNOSIS — E782 Mixed hyperlipidemia: Secondary | ICD-10-CM | POA: Insufficient documentation

## 2018-05-06 DIAGNOSIS — Z79899 Other long term (current) drug therapy: Secondary | ICD-10-CM | POA: Diagnosis not present

## 2018-05-06 DIAGNOSIS — I739 Peripheral vascular disease, unspecified: Secondary | ICD-10-CM | POA: Diagnosis not present

## 2018-05-06 DIAGNOSIS — I129 Hypertensive chronic kidney disease with stage 1 through stage 4 chronic kidney disease, or unspecified chronic kidney disease: Secondary | ICD-10-CM | POA: Diagnosis not present

## 2018-05-06 DIAGNOSIS — R9430 Abnormal result of cardiovascular function study, unspecified: Secondary | ICD-10-CM

## 2018-05-06 DIAGNOSIS — Z7982 Long term (current) use of aspirin: Secondary | ICD-10-CM | POA: Insufficient documentation

## 2018-05-06 DIAGNOSIS — E1122 Type 2 diabetes mellitus with diabetic chronic kidney disease: Secondary | ICD-10-CM | POA: Diagnosis not present

## 2018-05-06 DIAGNOSIS — M359 Systemic involvement of connective tissue, unspecified: Secondary | ICD-10-CM | POA: Insufficient documentation

## 2018-05-06 DIAGNOSIS — Z794 Long term (current) use of insulin: Secondary | ICD-10-CM | POA: Diagnosis not present

## 2018-05-06 DIAGNOSIS — I208 Other forms of angina pectoris: Secondary | ICD-10-CM | POA: Diagnosis present

## 2018-05-06 DIAGNOSIS — N189 Chronic kidney disease, unspecified: Secondary | ICD-10-CM | POA: Diagnosis not present

## 2018-05-06 DIAGNOSIS — Z888 Allergy status to other drugs, medicaments and biological substances status: Secondary | ICD-10-CM | POA: Diagnosis not present

## 2018-05-06 DIAGNOSIS — E559 Vitamin D deficiency, unspecified: Secondary | ICD-10-CM | POA: Insufficient documentation

## 2018-05-06 HISTORY — PX: LEFT HEART CATH AND CORONARY ANGIOGRAPHY: CATH118249

## 2018-05-06 LAB — GLUCOSE, CAPILLARY
Glucose-Capillary: 164 mg/dL — ABNORMAL HIGH (ref 70–99)
Glucose-Capillary: 211 mg/dL — ABNORMAL HIGH (ref 70–99)

## 2018-05-06 SURGERY — LEFT HEART CATH AND CORONARY ANGIOGRAPHY
Anesthesia: Moderate Sedation | Laterality: Left

## 2018-05-06 MED ORDER — HEPARIN (PORCINE) IN NACL 2000-0.9 UNIT/L-% IV SOLN
INTRAVENOUS | Status: DC | PRN
  Administered 2018-05-06: 500 mL

## 2018-05-06 MED ORDER — FENTANYL CITRATE (PF) 100 MCG/2ML IJ SOLN
INTRAMUSCULAR | Status: AC
  Filled 2018-05-06: qty 2

## 2018-05-06 MED ORDER — HYDRALAZINE HCL 20 MG/ML IJ SOLN: 10.0000 mg | INTRAMUSCULAR | Status: DC | PRN

## 2018-05-06 MED ORDER — SODIUM CHLORIDE 0.9% FLUSH: 3.0000 mL | INTRAVENOUS | Status: DC | PRN

## 2018-05-06 MED ORDER — ASPIRIN 81 MG PO CHEW
CHEWABLE_TABLET | ORAL | Status: AC
  Filled 2018-05-06: qty 1

## 2018-05-06 MED ORDER — MIDAZOLAM HCL 2 MG/2ML IJ SOLN
INTRAMUSCULAR | Status: AC
  Filled 2018-05-06: qty 2

## 2018-05-06 MED ORDER — LABETALOL HCL 5 MG/ML IV SOLN: 10.0000 mg | INTRAVENOUS | Status: DC | PRN

## 2018-05-06 MED ORDER — SODIUM CHLORIDE 0.9% FLUSH: 3.0000 mL | Freq: Two times a day (BID) | INTRAVENOUS | Status: DC

## 2018-05-06 MED ORDER — HEPARIN (PORCINE) IN NACL 1000-0.9 UT/500ML-% IV SOLN
INTRAVENOUS | Status: AC
  Filled 2018-05-06: qty 1000

## 2018-05-06 MED ORDER — ACETAMINOPHEN 325 MG PO TABS: 650.0000 mg | ORAL_TABLET | ORAL | Status: DC | PRN

## 2018-05-06 MED ORDER — LABETALOL HCL 5 MG/ML IV SOLN
INTRAVENOUS | Status: AC
  Filled 2018-05-06: qty 4

## 2018-05-06 MED ORDER — ASPIRIN 81 MG PO CHEW
81.0000 mg | CHEWABLE_TABLET | ORAL | Status: AC
  Administered 2018-05-06: 81 mg via ORAL

## 2018-05-06 MED ORDER — SODIUM CHLORIDE 0.9 % IV SOLN: 250.0000 mL | INTRAVENOUS | Status: DC | PRN

## 2018-05-06 MED ORDER — FENTANYL CITRATE (PF) 100 MCG/2ML IJ SOLN
INTRAMUSCULAR | Status: DC | PRN
  Administered 2018-05-06: 25 ug via INTRAVENOUS

## 2018-05-06 MED ORDER — LABETALOL HCL 5 MG/ML IV SOLN
INTRAVENOUS | Status: DC | PRN
  Administered 2018-05-06: 10 mg via INTRAVENOUS
  Administered 2018-05-06: 20 mg via INTRAVENOUS
  Administered 2018-05-06: 10 mg via INTRAVENOUS

## 2018-05-06 MED ORDER — SODIUM CHLORIDE 0.9 % WEIGHT BASED INFUSION: 1.0000 mL/kg/h | INTRAVENOUS | Status: DC

## 2018-05-06 MED ORDER — MIDAZOLAM HCL 2 MG/2ML IJ SOLN
INTRAMUSCULAR | Status: DC | PRN
  Administered 2018-05-06: 1 mg via INTRAVENOUS

## 2018-05-06 MED ORDER — IOPAMIDOL (ISOVUE-300) INJECTION 61%
INTRAVENOUS | Status: DC | PRN
  Administered 2018-05-06: 160 mL via INTRA_ARTERIAL

## 2018-05-06 MED ORDER — ONDANSETRON HCL 4 MG/2ML IJ SOLN: 4.0000 mg | Freq: Four times a day (QID) | INTRAMUSCULAR | Status: DC | PRN

## 2018-05-06 MED ORDER — SODIUM CHLORIDE 0.9 % WEIGHT BASED INFUSION
3.0000 mL/kg/h | INTRAVENOUS | Status: AC
  Administered 2018-05-06: 3 mL/kg/h via INTRAVENOUS

## 2018-05-06 SURGICAL SUPPLY — 12 items
CATH INFINITI 5FR ANG PIGTAIL (CATHETERS) ×1 IMPLANT
CATH INFINITI 5FR JL4 (CATHETERS) ×1 IMPLANT
CATH INFINITI JR4 5F (CATHETERS) ×1 IMPLANT
DEVICE CLOSURE MYNXGRIP 5F (Vascular Products) ×1 IMPLANT
GLIDESHEATH SLEND SS 6F .021 (SHEATH) IMPLANT
KIT MANI 3VAL PERCEP (MISCELLANEOUS) ×2 IMPLANT
NDL PERC 18GX7CM (NEEDLE) IMPLANT
NEEDLE PERC 18GX7CM (NEEDLE) ×2 IMPLANT
PACK CARDIAC CATH (CUSTOM PROCEDURE TRAY) ×2 IMPLANT
SHEATH AVANTI 5FR X 11CM (SHEATH) ×1 IMPLANT
WIRE GUIDERIGHT .035X150 (WIRE) ×1 IMPLANT
WIRE ROSEN-J .035X260CM (WIRE) IMPLANT

## 2018-05-06 NOTE — Progress Notes (Signed)
DR. Nehemiah Massed spoke with pt. At bedside re: cath results. MD and RN attempted to call pt. Husband--no answer while MD here. Pt. Spouse [name] then called back and RN spoke with husband re: DC instructions and follow-up. Reviewed BP and groin care; follow-up. Spouse verbalized understanding of instructions.

## 2018-05-07 ENCOUNTER — Encounter: Payer: Self-pay | Admitting: Internal Medicine

## 2018-05-15 ENCOUNTER — Ambulatory Visit: Payer: Medicare Other | Admitting: Nurse Practitioner

## 2018-05-19 DIAGNOSIS — I252 Old myocardial infarction: Secondary | ICD-10-CM | POA: Diagnosis not present

## 2018-05-19 DIAGNOSIS — I6523 Occlusion and stenosis of bilateral carotid arteries: Secondary | ICD-10-CM | POA: Diagnosis not present

## 2018-05-19 DIAGNOSIS — I2510 Atherosclerotic heart disease of native coronary artery without angina pectoris: Secondary | ICD-10-CM | POA: Diagnosis not present

## 2018-05-19 DIAGNOSIS — I10 Essential (primary) hypertension: Secondary | ICD-10-CM | POA: Diagnosis not present

## 2018-06-11 ENCOUNTER — Other Ambulatory Visit: Payer: Self-pay

## 2018-06-11 ENCOUNTER — Ambulatory Visit (INDEPENDENT_AMBULATORY_CARE_PROVIDER_SITE_OTHER): Payer: Medicare Other | Admitting: Nurse Practitioner

## 2018-06-11 VITALS — BP 180/70 | HR 59 | Resp 16 | Ht 67.5 in | Wt 200.8 lb

## 2018-06-11 DIAGNOSIS — I10 Essential (primary) hypertension: Secondary | ICD-10-CM

## 2018-06-11 DIAGNOSIS — L0233 Carbuncle of buttock: Secondary | ICD-10-CM | POA: Diagnosis not present

## 2018-06-11 MED ORDER — CEPHALEXIN 500 MG PO CAPS: 500.0000 mg | ORAL_CAPSULE | Freq: Three times a day (TID) | ORAL | 0 refills | Status: DC

## 2018-06-11 MED ORDER — LIDOCAINE VISCOUS HCL 2 % MT SOLN: OROMUCOSAL | 0 refills | Status: DC

## 2018-06-11 NOTE — Progress Notes (Signed)
[facility] [place], [place] [address]  Internal MEDICINE  Office Visit Note  Patient Name: Jennifer Carrillo  [id number]  [id number]  Date of Service: 06/18/2018   Pt is here for a sick visit  Chief Complaint  Patient presents with  . Recurrent Skin Infections    pt has soreness from boil, that is located on the left side of buttocks, hurts to sit down and hurts alot at night time, pt noticed the pain about 2wks ago, pain went away and came back  . Pain    pain is about a 7 right now     The patient is here for sick visit. She has boil located on the left buttock. Continues to get bigger. Is very painful. Hurts even to sit now. Has has not noted any drainage from the boil. Blood pressure is elevated and this is likely due to pain. She denies fever, chills, or body aches.   .      Current Medication:  Outpatient Encounter Medications as of 06/11/2018  Medication Sig Note  . ACCU-CHEK FASTCLIX LANCETS MISC by Does not apply route 3 (three) times daily after meals.   Marland Kitchen acetaminophen (TYLENOL) 500 MG tablet Take 1,000 mg by mouth every 6 (six) hours as needed for moderate pain or headache.   Marland Kitchen amLODipine (NORVASC) 10 MG tablet Take 1 tablet (10 mg total) by mouth daily.   Marland Kitchen anastrozole (ARIMIDEX) 1 MG tablet Take 1 mg by mouth daily.   Marland Kitchen aspirin 81 MG tablet Take 81 mg by mouth daily. 03/27/2018: On hold due to upcoming procedure.  Marland Kitchen atorvastatin (LIPITOR) 10 MG tablet Take 1 tablet (10 mg total) by mouth daily with supper.   . calcium carbonate (OSCAL) 1500 (600 Ca) MG TABS tablet Take 600 mg by mouth daily.   . clopidogrel (PLAVIX) 75 MG tablet TAKE 1 TABLET BY MOUTH DAILY (Patient taking differently: Take 75 mg by mouth every evening. )   . Cranberry-Vitamin C-Vitamin E (CRANBERRY PLUS VITAMIN C PO) Take 1 tablet by mouth every other day.   . digoxin (LANOXIN) 0.125 MG tablet Take 1 tablet (0.125 mg total) by mouth daily.   Marland Kitchen docusate sodium (COLACE)  100 MG capsule Take 200 mg by mouth at bedtime as needed for mild constipation.    Marland Kitchen donepezil (ARICEPT) 10 MG tablet Take 1 tablet (10 mg total) by mouth at bedtime. Take one tab po qhs for memory   . Ergocalciferol (VITAMIN D2) 10 MCG (400 UNIT) TABS Take 400 Units by mouth every other day.    . ferrous sulfate 325 (65 FE) MG EC tablet Take 325 mg by mouth daily.    Marland Kitchen glucose blood (ACCU-CHEK SMARTVIEW) test strip 3 each by Other route 3 (three) times daily after meals. Use as instructed   . hydrALAZINE (APRESOLINE) 25 MG tablet Take 1 tablet (25 mg total) by mouth 2 (two) times daily.   . hydrochlorothiazide (HYDRODIURIL) 12.5 MG tablet Take 12.5 mg by mouth daily.   Marland Kitchen ibandronate (BONIVA) 150 MG tablet Take 150 mg by mouth every 30 (thirty) days. Take in the morning with a full glass of water, on an empty stomach, and do not take anything else by mouth or lie down for the next 30 min.   . insulin NPH-regular Human (70-30) 100 UNIT/ML injection Inject 30 Units into the skin 2 (two) times daily.   . isosorbide mononitrate (IMDUR) 30 MG 24 hr tablet Take 30 mg by mouth  daily.    . magnesium oxide (MAG-OX) 400 (241.3 Mg) MG tablet Take 1 tablet (400 mg total) by mouth daily. (Patient taking differently: Take 400 mg by mouth daily at 12 noon. )   . montelukast (SINGULAIR) 10 MG tablet Take 1 tablet (10 mg total) by mouth at bedtime. (Patient taking differently: Take 10 mg by mouth daily as needed (allergies.). )   . pantoprazole (PROTONIX) 40 MG tablet Take 1 tablet (40 mg total) by mouth daily. (Patient taking differently: Take 40 mg by mouth daily before breakfast. )   . PROAIR HFA 108 (90 Base) MCG/ACT inhaler Inhale 2 puffs into the lungs every 6 (six) hours as needed for wheezing or shortness of breath.    Marland Kitchen rOPINIRole (REQUIP) 0.5 MG tablet Take 1 tablet (0.5 mg total) by mouth at bedtime.   Marland Kitchen telmisartan (MICARDIS) 80 MG tablet Take 1 tablet (80 mg total) by mouth daily.   . vitamin C (ASCORBIC  ACID) 500 MG tablet Take 500 mg by mouth daily.    . cephALEXin (KEFLEX) 500 MG capsule Take 1 capsule (500 mg total) by mouth 3 (three) times daily.   Marland Kitchen lidocaine (XYLOCAINE) 2 % solution Apply very small amount to affected area QID prn pain    No facility-administered encounter medications on file as of 06/11/2018.       Medical History: Past Medical History:  Diagnosis Date  . Arthritis   . Asthma   . Calf pain   . Cancer [facility]) 2016   left breast  . COPD (chronic obstructive pulmonary disease) ([place])   . Diabetes ([place])   . Discoid lupus   . Gastro-esophageal reflux   . Glaucoma   . Hyperlipemia   . Hypertension   . Iron deficiency anemia   . Joint pain   . Leg swelling   . Osteoarthritis   . PVD (peripheral vascular disease) ([place])   . Sinus problem   . Sleep apnea    No CPAP/ Can't tolerate  . Stroke ([place]) 1987     Today's Vitals   06/11/18 1041  BP: (!) 180/70  Pulse: (!) 59  Resp: 16  SpO2: 99%  Weight: 200 lb 12.8 oz (91.1 kg)  Height: 5' 7.5" (1.715 m)   Body mass index is 30.99 kg/m.  Review of Systems  Constitutional: Positive for fatigue. Negative for chills, fever and unexpected weight change.  HENT: Negative for congestion, postnasal drip, rhinorrhea, sneezing and sore throat.   Respiratory: Negative for cough, chest tightness, shortness of breath and wheezing.   Cardiovascular: Negative for chest pain and palpitations.       Elevated blood pressure today.   Gastrointestinal: Negative for abdominal pain, constipation, diarrhea, nausea and vomiting.  Genitourinary: Positive for genital sores.       Boil along the left buttock.  Musculoskeletal: Negative for arthralgias, back pain, joint swelling and neck pain.  Skin: Negative for rash.       Boil along left buttock.   Neurological: Negative for dizziness, tremors, numbness and headaches.  Hematological: Negative for adenopathy. Does not bruise/bleed easily.  Psychiatric/Behavioral: Negative for  behavioral problems (Depression), sleep disturbance and suicidal ideas. The patient is not nervous/anxious.     Physical Exam Vitals signs and nursing note reviewed.  Constitutional:      General: She is not in acute distress.    Appearance: Normal appearance. She is well-developed. She is not diaphoretic.     Comments: Appears to be in pain.   HENT:  Head: Normocephalic and atraumatic.     Mouth/Throat:     Pharynx: No oropharyngeal exudate.  Eyes:     Pupils: Pupils are equal, round, and reactive to light.  Neck:     Musculoskeletal: Normal range of motion and neck supple.     Thyroid: No thyromegaly.     Vascular: No JVD.     Trachea: No tracheal deviation.  Cardiovascular:     Rate and Rhythm: Normal rate and regular rhythm.     Heart sounds: Normal heart sounds. No murmur. No friction rub. No gallop.   Pulmonary:     Effort: Pulmonary effort is normal. No respiratory distress.     Breath sounds: Normal breath sounds. No wheezing or rales.  Chest:     Chest wall: No tenderness.  Abdominal:     General: Bowel sounds are normal.     Palpations: Abdomen is soft.  Musculoskeletal: Normal range of motion.  Lymphadenopathy:     Cervical: No cervical adenopathy.  Skin:    General: Skin is warm and dry.     Comments: There is red, hard, very tender abscess on the left buttock. Skin warm to touch. Skin intact with o drainage present.   Neurological:     Mental Status: She is alert and oriented to person, place, and time.     Cranial Nerves: No cranial nerve deficit.  Psychiatric:        Behavior: Behavior normal.        Thought Content: Thought content normal.        Judgment: Judgment normal.    Assessment/Plan: 1. Carbuncle and furuncle of buttock Start keflex 500mg  three times daily for next 10 days. Patient given prescription for viscous lidocaine. She should apply very small amount to affected area to help with pain control. Encouraged her to apply warm, moist  compress to the area to hasten drainage.  - cephALEXin (KEFLEX) 500 MG capsule; Take 1 capsule (500 mg total) by mouth 3 (three) times daily.  Dispense: 30 capsule; Refill: 0 - lidocaine (XYLOCAINE) 2 % solution; Apply very small amount to affected area QID prn pain  Dispense: 15 mL; Refill: 0  2. Essential hypertension BP elevated today, likely due to pain. Will monitor closely and adjust medication as indicated.   General Counseling: roquel burgin understanding of the findings of todays visit and agrees with plan of treatment. I have discussed any further diagnostic evaluation that may be needed or ordered today. We also reviewed her medications today. she has been encouraged to call the office with any questions or concerns that should arise related to todays visit.    Counseling:  This patient was seen by [place] with Dr [name] as a part of collaborative care agreement  Meds ordered this encounter  Medications  . cephALEXin (KEFLEX) 500 MG capsule    Sig: Take 1 capsule (500 mg total) by mouth 3 (three) times daily.    Dispense:  30 capsule    Refill:  0    Order Specific Question:   Supervising Provider    Answer:   [name] [[id number]]  . lidocaine (XYLOCAINE) 2 % solution    Sig: Apply very small amount to affected area QID prn pain    Dispense:  15 mL    Refill:  0    Order Specific Question:   Supervising Provider    Answer:   [name] [[id number]]    Time spent: 25 Minutes

## 2018-06-11 NOTE — Progress Notes (Signed)
Pt blood pressure elevated, taken twice,  1st reading 197/77 2nd reading 196/80 Pulse low  Informed provider, Pt is in a lot of pain at the visit.

## 2018-06-16 ENCOUNTER — Other Ambulatory Visit: Payer: Self-pay

## 2018-06-16 ENCOUNTER — Ambulatory Visit (INDEPENDENT_AMBULATORY_CARE_PROVIDER_SITE_OTHER): Payer: Medicare Other

## 2018-06-16 ENCOUNTER — Encounter (INDEPENDENT_AMBULATORY_CARE_PROVIDER_SITE_OTHER): Payer: Self-pay | Admitting: Vascular Surgery

## 2018-06-16 ENCOUNTER — Other Ambulatory Visit (INDEPENDENT_AMBULATORY_CARE_PROVIDER_SITE_OTHER): Payer: Self-pay | Admitting: Vascular Surgery

## 2018-06-16 ENCOUNTER — Ambulatory Visit (INDEPENDENT_AMBULATORY_CARE_PROVIDER_SITE_OTHER): Payer: Medicare Other | Admitting: Vascular Surgery

## 2018-06-16 VITALS — BP 184/66 | HR 71 | Resp 10 | Ht 67.0 in | Wt 200.0 lb

## 2018-06-16 DIAGNOSIS — Z7982 Long term (current) use of aspirin: Secondary | ICD-10-CM

## 2018-06-16 DIAGNOSIS — I739 Peripheral vascular disease, unspecified: Secondary | ICD-10-CM

## 2018-06-16 DIAGNOSIS — I10 Essential (primary) hypertension: Secondary | ICD-10-CM

## 2018-06-16 DIAGNOSIS — I70219 Atherosclerosis of native arteries of extremities with intermittent claudication, unspecified extremity: Secondary | ICD-10-CM | POA: Diagnosis not present

## 2018-06-16 DIAGNOSIS — I6523 Occlusion and stenosis of bilateral carotid arteries: Secondary | ICD-10-CM | POA: Diagnosis not present

## 2018-06-16 DIAGNOSIS — I2510 Atherosclerotic heart disease of native coronary artery without angina pectoris: Secondary | ICD-10-CM

## 2018-06-16 DIAGNOSIS — I701 Atherosclerosis of renal artery: Secondary | ICD-10-CM

## 2018-06-16 DIAGNOSIS — Z87891 Personal history of nicotine dependence: Secondary | ICD-10-CM

## 2018-06-16 DIAGNOSIS — I150 Renovascular hypertension: Secondary | ICD-10-CM | POA: Diagnosis not present

## 2018-06-16 DIAGNOSIS — Z79899 Other long term (current) drug therapy: Secondary | ICD-10-CM

## 2018-06-16 NOTE — Progress Notes (Signed)
MRN : [id number]  Jennifer Carrillo is a 85 y.o. (08-29-1933) female who presents with chief complaint of No chief complaint on file. Marland Kitchen  History of Present Illness:   She has done well with her renal artery stent placement.  Her BP has decreased substantially.  The patient denies interval amaurosis fugax. There is no recent or interval TIA symptoms or focal motor deficits. There is no prior documented CVA.  The patient is taking enteric-coated aspirin 81 mg daily.  There is no history of migraine headaches. There is no history of seizures.  The patient has a history of coronary artery disease, no recent episodes of angina or shortness of breath. The patient denies PAD or claudication symptoms. There is a history of hyperlipidemia which is being treated with a statin.   CT angiogram is reviewed by me personally and shows >90% stenosis consistent with calcified plaque at the origin of the LICA internal carotid artery.   No outpatient medications have been marked as taking for the 06/16/18 encounter (Appointment) with Delana Meyer, [name], MD.    Past Medical History:  Diagnosis Date  . Arthritis   . Asthma   . Calf pain   . Cancer [facility]) 2016   left breast  . COPD (chronic obstructive pulmonary disease) ([place])   . Diabetes ([place])   . Discoid lupus   . Gastro-esophageal reflux   . Glaucoma   . Hyperlipemia   . Hypertension   . Iron deficiency anemia   . Joint pain   . Leg swelling   . Osteoarthritis   . PVD (peripheral vascular disease) ([place])   . Sinus problem   . Sleep apnea    No CPAP/ Can't tolerate  . Stroke [facility]) 1987    Past Surgical History:  Procedure Laterality Date  . BREAST SURGERY Left    left lumpectomy  . CAROTID ENDARTERECTOMY Right   . CATARACT EXTRACTION Right 2013  . CORONARY STENT PLACEMENT     L. L. E.  . EYE SURGERY Bilateral    cataract  . LEFT HEART CATH AND CORONARY ANGIOGRAPHY Left 05/06/2018   Procedure: LEFT HEART CATH AND CORONARY  ANGIOGRAPHY;  Surgeon: Corey Skains, MD;  Location: [place] [facility];  Service: Cardiovascular;  Laterality: Left;  . LOWER EXTREMITY ANGIOGRAPHY Right 01/22/2018   Procedure: LOWER EXTREMITY ANGIOGRAPHY;  Surgeon: Katha Cabal, MD;  Location: [place] [facility];  Service: Cardiovascular;  Laterality: Right;  . RENAL ANGIOGRAPHY Left 02/26/2018   Procedure: RENAL ANGIOGRAPHY;  Surgeon: Katha Cabal, MD;  Location: [place] [facility];  Service: Cardiovascular;  Laterality: Left;  . STENT PLACEMENT VASCULAR ([facility] HX) Left    stent placement on LLE  . TUBAL LIGATION      Social History Social History   Tobacco Use  . Smoking status: Former Smoker    Last attempt to quit: 1987    Years since quitting: 33.4  . Smokeless tobacco: Never Used  . Tobacco comment: quit 31 years  Substance Use Topics  . Alcohol use: No  . Drug use: No    Family History Family History  Problem Relation Age of Onset  . Heart disease Mother   . Diabetes Other   . Hypertension Other   . Diabetes Sister   . Lung cancer Brother   . Leukemia Daughter   . Bladder Cancer Neg Hx   . Kidney disease Neg Hx   . Prostate cancer Neg Hx  Allergies  Allergen Reactions  . Benazepril Nausea Only     REVIEW OF SYSTEMS (Negative unless checked)  Constitutional: [] Weight loss  [] Fever  [] Chills Cardiac: [] Chest pain   [] Chest pressure   [] Palpitations   [] Shortness of breath when laying flat   [] Shortness of breath with exertion. Vascular:  [x] Pain in legs with walking   [x] Pain in legs at rest  [] History of DVT   [] Phlebitis   [] Swelling in legs   [] Varicose veins   [] Non-healing ulcers Pulmonary:   [] Uses home oxygen   [] Productive cough   [] Hemoptysis   [] Wheeze  [] COPD   [] Asthma Neurologic:  [] Dizziness   [] Seizures   [] History of stroke   [] History of TIA  [] Aphasia   [x] Vissual changes   [] Weakness or numbness in arm   [] Weakness or numbness in leg Musculoskeletal:   [] Joint  swelling   [] Joint pain   [] Low back pain Hematologic:  [] Easy bruising  [] Easy bleeding   [] Hypercoagulable state   [] Anemic Gastrointestinal:  [] Diarrhea   [] Vomiting  [] Gastroesophageal reflux/heartburn   [] Difficulty swallowing. Genitourinary:  [x] Chronic kidney disease   [] Difficult urination  [] Frequent urination   [] Blood in urine Skin:  [] Rashes   [] Ulcers  Psychological:  [] History of anxiety   []  History of major depression.  Physical Examination  There were no vitals filed for this visit. There is no height or weight on file to calculate BMI. Gen: WD/WN, NAD Head: [place]/AT, No temporalis wasting.  Ear/Nose/Throat: Hearing grossly intact, nares w/o erythema or drainage Eyes: PER, EOMI, sclera nonicteric.  Neck: Supple, no large masses.   Pulmonary:  Good air movement, no audible wheezing bilaterally, no use of accessory muscles.  Cardiac: RRR, no JVD Vascular: bilateral carotid bruit Vessel Right Left  Radial Palpable Palpable  Popliteal Not Palpable Not Palpable  PT Not Palpable Not Palpable  DP Not Palpable Not Palpable  Gastrointestinal: Non-distended. No guarding/no peritoneal signs.  Musculoskeletal: M/S 5/5 throughout.  No deformity or atrophy.  Neurologic: CN 2-12 intact. Symmetrical.  Speech is fluent. Motor exam as listed above. Psychiatric: Judgment intact, Mood & affect appropriate for pt's clinical situation. Dermatologic: No rashes or ulcers noted.  No changes consistent with cellulitis. Lymph : No lichenification or skin changes of chronic lymphedema.  CBC Lab Results  Component Value Date   WBC 9.0 04/10/2017   HGB 10.2 (L) 04/10/2017   HCT 32.3 (L) 04/10/2017   MCV 75 (L) 04/10/2017   PLT 390 (H) 04/10/2017    BMET    Component Value Date/Time   NA 141 12/17/2017 1032   NA 142 05/20/2013 0509   K 4.9 12/17/2017 1032   K 3.7 05/20/2013 0509   CL 108 (H) 12/17/2017 1032   CL 108 (H) 05/20/2013 0509   CO2 21 12/17/2017 1032   CO2 30 05/20/2013  0509   GLUCOSE 150 (H) 12/17/2017 1032   GLUCOSE 120 (H) 04/27/2016 1404   GLUCOSE 108 (H) 05/20/2013 0509   BUN 30 (H) 02/26/2018 1209   BUN 31 (H) 12/17/2017 1032   BUN 13 05/20/2013 0509   CREATININE 1.29 (H) 02/26/2018 1209   CREATININE 0.92 05/20/2013 0509   CALCIUM 9.4 12/17/2017 1032   CALCIUM 8.9 05/20/2013 0509   GFRNONAA 39 (L) 02/26/2018 1209   GFRNONAA >60 05/20/2013 0509   GFRAA 45 (L) 02/26/2018 1209   GFRAA >60 05/20/2013 0509   CrCl cannot be calculated (Patient's most recent lab result is older than the maximum 21 days allowed.).  COAG No  results found for: INR, PROTIME  Radiology No results found.   Assessment/Plan 1. Bilateral carotid artery stenosis Recommend:  The patient is symptomatic with respect to the carotid stenosis.  The patient now has progressed and has a lesion the is >65% of the LICA.  Patient's CT angiography of the carotid arteries confirms >70% left ICA stenosis.  The anatomical considerations support stenting over surgery.  This was discussed in detail with the patient.  The risks, benefits and alternative therapies were reviewed in detail with the patient.  All questions were answered.  The patient agrees to proceed with stenting of the left carotid artery.  Continue antiplatelet therapy as prescribed. Continue management of CAD, HTN and Hyperlipidemia. Healthy heart diet, encouraged exercise at least 4 times per week.    A total of 30 minutes was spent with this patient and greater than 50% was spent in counseling and coordination of care with the patient.  Discussion included the treatment options for vascular disease including indications for surgery and intervention.  Also discussed is the appropriate timing of treatment.  In addition medical therapy was discussed.  2. Renovascular hypertension BP today was not acceptable with systolic reading > 035 and diastolic reading >46 while taking 3 medications.  Given that optimal control  of the patient's hypertension is important to minimize the risk of heart attack and/or CVA.  The patient's BP and noninvasive studies suggest the possibility of a hemodynamically significant stricture or stenosis.  Angiography was discussed with the patient, the risks and benefits were reviewed and all questions were answered.  The patient has agreed to proceed with angiography and the intention of intervention.     Arrangements will be made to treat the left renal artery.  The carotid will be treated first and then the renal can be addressed.   The patient will continue the current medications, no changes at this time.  The primary medical service will continue aggressive antihypertensive therapy as per the AHA guidelines     3. Atherosclerotic peripheral vascular disease with intermittent claudication (HCC) Recommend:  The patient has evidence of severe atherosclerotic changes of both lower extremities with rest pain that is associated with preulcerative changes and impending tissue loss of the foot.  This represents a limb threatening ischemia and places the patient at the risk for limb loss.  Patient should undergo angiography of the lower extremities with the hope for intervention for limb salvage.  This will be undertaken after the carotid and the renal arteries are addressed.  The risks and benefits as well as the alternative therapies was discussed in detail with the patient.  All questions were answered.  Patient agrees to proceed with angiography.  The patient will follow up with me in the office after the procedure.   4. Benign essential HTN Continue antihypertensive medications as already ordered, these medications have been reviewed and there are no changes at this time.   5. Coronary artery disease involving native coronary artery of native heart, angina presence unspecified Continue cardiac and antihypertensive medications as already ordered and reviewed, no changes at this  time.  Continue statin as ordered and reviewed, no changes at this time  Nitrates PRN for chest pain     Hortencia Pilar, MD  06/16/2018 9:06 AM

## 2018-06-17 ENCOUNTER — Telehealth (INDEPENDENT_AMBULATORY_CARE_PROVIDER_SITE_OTHER): Payer: Self-pay

## 2018-06-17 ENCOUNTER — Encounter: Payer: Self-pay | Admitting: Adult Health

## 2018-06-17 ENCOUNTER — Ambulatory Visit (INDEPENDENT_AMBULATORY_CARE_PROVIDER_SITE_OTHER): Payer: Medicare Other | Admitting: Adult Health

## 2018-06-17 VITALS — Ht 67.0 in | Wt 200.0 lb

## 2018-06-17 DIAGNOSIS — I10 Essential (primary) hypertension: Secondary | ICD-10-CM

## 2018-06-17 DIAGNOSIS — L0233 Carbuncle of buttock: Secondary | ICD-10-CM | POA: Diagnosis not present

## 2018-06-17 MED ORDER — TRAMADOL HCL 50 MG PO TABS: 50.0000 mg | ORAL_TABLET | Freq: Three times a day (TID) | ORAL | 0 refills | Status: AC | PRN

## 2018-06-17 NOTE — Telephone Encounter (Signed)
I received a call from the patient's daughter about medication that was to be called into the pharmacy and it not being there. The daughter wanted me to call the patient. I called the patient to ask about what was the medication that was to be called into the pharmacy. Dr. Delana Meyer saw the patient on 06/16/2018 for carotid ultrasound and f/u. Patient stated she was put on Keflex by Dr. Humphrey Rolls and she has been taking the medication for a week and nothing has changed. I again asked what the medication was that she was to have called in and patient was unsure of what that was. The husband was in the background stating the patient was getting a call from Dr. Laurelyn Sickle office today at 11:00 am and they would call back to our office after that.

## 2018-06-17 NOTE — Progress Notes (Signed)
[facility] [place], [place] [address]  Internal MEDICINE  Telephone Visit  Patient Name: EMSLEE Carrillo  [id number]  [id number]  Date of Service: 06/17/2018  I connected with the patient at 1145 by telephone and verified the patients identity using two identifiers.   I discussed the limitations, risks, security and privacy concerns of performing an evaluation and management service by telephone and the availability of in person appointments. I also discussed with the patient that there may be a patient responsible charge related to the service.  The patient expressed understanding and agrees to proceed.    Chief Complaint  Patient presents with  . Telephone Assessment  . Telephone Screen  . Recurrent Skin Infections    still having bad pain cannot sleep     HPI  Pt seen for telephone visit. She reports continued pain in her leg and buttocks.  However, she reports since she has been on antibiotics the boil has gotten better, but the pain and swelling have persisted.  She is unable to sleep due to the pain.     Current Medication: Outpatient Encounter Medications as of 06/17/2018  Medication Sig Note  . ACCU-CHEK FASTCLIX LANCETS MISC by Does not apply route 3 (three) times daily after meals.   Marland Kitchen acetaminophen (TYLENOL) 500 MG tablet Take 1,000 mg by mouth every 6 (six) hours as needed for moderate pain or headache.   Marland Kitchen amLODipine (NORVASC) 10 MG tablet Take 1 tablet (10 mg total) by mouth daily.   Marland Kitchen anastrozole (ARIMIDEX) 1 MG tablet Take 1 mg by mouth daily.   Marland Kitchen aspirin 81 MG tablet Take 81 mg by mouth daily. 03/27/2018: On hold due to upcoming procedure.  Marland Kitchen atorvastatin (LIPITOR) 10 MG tablet Take 1 tablet (10 mg total) by mouth daily with supper.   . calcium carbonate (OSCAL) 1500 (600 Ca) MG TABS tablet Take 600 mg by mouth daily.   . cephALEXin (KEFLEX) 500 MG capsule Take 1 capsule (500 mg total) by mouth 3 (three) times daily.   . clopidogrel  (PLAVIX) 75 MG tablet TAKE 1 TABLET BY MOUTH DAILY (Patient taking differently: Take 75 mg by mouth every evening. )   . Cranberry-Vitamin C-Vitamin E (CRANBERRY PLUS VITAMIN C PO) Take 1 tablet by mouth every other day.   . digoxin (LANOXIN) 0.125 MG tablet Take 1 tablet (0.125 mg total) by mouth daily.   Marland Kitchen docusate sodium (COLACE) 100 MG capsule Take 200 mg by mouth at bedtime as needed for mild constipation.    Marland Kitchen donepezil (ARICEPT) 10 MG tablet Take 1 tablet (10 mg total) by mouth at bedtime. Take one tab po qhs for memory   . Ergocalciferol (VITAMIN D2) 10 MCG (400 UNIT) TABS Take 400 Units by mouth every other day.    . ferrous sulfate 325 (65 FE) MG EC tablet Take 325 mg by mouth daily.    Marland Kitchen glucose blood (ACCU-CHEK SMARTVIEW) test strip 3 each by Other route 3 (three) times daily after meals. Use as instructed   . hydrALAZINE (APRESOLINE) 25 MG tablet Take 1 tablet (25 mg total) by mouth 2 (two) times daily.   . hydrochlorothiazide (HYDRODIURIL) 12.5 MG tablet Take 12.5 mg by mouth daily.   Marland Kitchen ibandronate (BONIVA) 150 MG tablet Take 150 mg by mouth every 30 (thirty) days. Take in the morning with a full glass of water, on an empty stomach, and do not take anything else by mouth or lie down for the next 30  min.   . insulin NPH-regular Human (70-30) 100 UNIT/ML injection Inject 30 Units into the skin 2 (two) times daily.   . isosorbide mononitrate (IMDUR) 30 MG 24 hr tablet Take 30 mg by mouth daily.    Marland Kitchen lidocaine (XYLOCAINE) 2 % solution Apply very small amount to affected area QID prn pain   . magnesium oxide (MAG-OX) 400 (241.3 Mg) MG tablet Take 1 tablet (400 mg total) by mouth daily. (Patient taking differently: Take 400 mg by mouth daily at 12 noon. )   . montelukast (SINGULAIR) 10 MG tablet Take 1 tablet (10 mg total) by mouth at bedtime. (Patient taking differently: Take 10 mg by mouth daily as needed (allergies.). )   . pantoprazole (PROTONIX) 40 MG tablet Take 1 tablet (40 mg total)  by mouth daily. (Patient taking differently: Take 40 mg by mouth daily before breakfast. )   . PROAIR HFA 108 (90 Base) MCG/ACT inhaler Inhale 2 puffs into the lungs every 6 (six) hours as needed for wheezing or shortness of breath.    Marland Kitchen rOPINIRole (REQUIP) 0.5 MG tablet Take 1 tablet (0.5 mg total) by mouth at bedtime.   Marland Kitchen telmisartan (MICARDIS) 80 MG tablet Take 1 tablet (80 mg total) by mouth daily.   . vitamin C (ASCORBIC ACID) 500 MG tablet Take 500 mg by mouth daily.    . traMADol (ULTRAM) 50 MG tablet Take 1-2 tablets (50-100 mg total) by mouth every 8 (eight) hours as needed for up to 5 days.    No facility-administered encounter medications on file as of 06/17/2018.     Surgical History: Past Surgical History:  Procedure Laterality Date  . BREAST SURGERY Left    left lumpectomy  . CAROTID ENDARTERECTOMY Right   . CATARACT EXTRACTION Right 2013  . CORONARY STENT PLACEMENT     L. L. E.  . EYE SURGERY Bilateral    cataract  . LEFT HEART CATH AND CORONARY ANGIOGRAPHY Left 05/06/2018   Procedure: LEFT HEART CATH AND CORONARY ANGIOGRAPHY;  Surgeon: Corey Skains, MD;  Location: [place] [facility];  Service: Cardiovascular;  Laterality: Left;  . LOWER EXTREMITY ANGIOGRAPHY Right 01/22/2018   Procedure: LOWER EXTREMITY ANGIOGRAPHY;  Surgeon: Katha Cabal, MD;  Location: [place] [facility];  Service: Cardiovascular;  Laterality: Right;  . RENAL ANGIOGRAPHY Left 02/26/2018   Procedure: RENAL ANGIOGRAPHY;  Surgeon: Katha Cabal, MD;  Location: [place] [facility];  Service: Cardiovascular;  Laterality: Left;  . STENT PLACEMENT VASCULAR ([facility] HX) Left    stent placement on LLE  . TUBAL LIGATION      Medical History: Past Medical History:  Diagnosis Date  . Arthritis   . Asthma   . Calf pain   . Cancer [facility]) 2016   left breast  . COPD (chronic obstructive pulmonary disease) ([place])   . Diabetes ([place])   . Discoid lupus   . Gastro-esophageal reflux   . Glaucoma    . Hyperlipemia   . Hypertension   . Iron deficiency anemia   . Joint pain   . Leg swelling   . Osteoarthritis   . PVD (peripheral vascular disease) ([place])   . Sinus problem   . Sleep apnea    No CPAP/ Can't tolerate  . Stroke [facility]) 1987    Family History: Family History  Problem Relation Age of Onset  . Heart disease Mother   . Diabetes Other   . Hypertension Other   . Diabetes Sister   .  Lung cancer Brother   . Leukemia Daughter   . Bladder Cancer Neg Hx   . Kidney disease Neg Hx   . Prostate cancer Neg Hx     Social History   Socioeconomic History  . Marital status: Married    Spouse name: Not on file  . Number of children: Not on file  . Years of education: Not on file  . Highest education level: Not on file  Occupational History  . Not on file  Social Needs  . Financial resource strain: Not on file  . Food insecurity:    Worry: Not on file    Inability: Not on file  . Transportation needs:    Medical: Not on file    Non-medical: Not on file  Tobacco Use  . Smoking status: Former Smoker    Last attempt to quit: 1987    Years since quitting: 33.4  . Smokeless tobacco: Never Used  . Tobacco comment: quit 31 years  Substance and Sexual Activity  . Alcohol use: No  . Drug use: No  . Sexual activity: Not on file  Lifestyle  . Physical activity:    Days per week: Not on file    Minutes per session: Not on file  . Stress: Not on file  Relationships  . Social connections:    Talks on phone: Not on file    Gets together: Not on file    Attends religious service: Not on file    Active member of club or organization: Not on file    Attends meetings of clubs or organizations: Not on file    Relationship status: Not on file  . Intimate partner violence:    Fear of current or ex partner: Not on file    Emotionally abused: Not on file    Physically abused: Not on file    Forced sexual activity: Not on file  Other Topics Concern  . Not on file  Social  History Narrative  . Not on file      Review of Systems  Constitutional: Negative for chills, fatigue and unexpected weight change.  HENT: Negative for congestion, rhinorrhea, sneezing and sore throat.   Eyes: Negative for photophobia, pain and redness.  Respiratory: Negative for cough, chest tightness and shortness of breath.   Cardiovascular: Negative for chest pain and palpitations.  Gastrointestinal: Negative for abdominal pain, constipation, diarrhea, nausea and vomiting.  Endocrine: Negative.   Genitourinary: Negative for dysuria and frequency.  Musculoskeletal: Negative for arthralgias, back pain, joint swelling and neck pain.  Skin: Negative for rash.  Allergic/Immunologic: Negative.   Neurological: Negative for tremors and numbness.  Hematological: Negative for adenopathy. Does not bruise/bleed easily.  Psychiatric/Behavioral: Negative for behavioral problems and sleep disturbance. The patient is not nervous/anxious.     Vital Signs: Ht 5\' 7"  (1.702 m)   Wt 200 lb (90.7 kg)   BMI 31.32 kg/m    Observation/Objective:  Pt appears well, but fatigued from not being able to sleep. NAD noted   Assessment/Plan: 1. Carbuncle and furuncle of buttock Take tramadol as prescribed for pain.  Complete antibiotic course that patient is currently taking.  Pain continues or infected area does not resolve patient will return to clinic.  2. Essential hypertension Stable at this time continue present management.  General Counseling: lamar naef understanding of the findings of today's phone visit and agrees with plan of treatment. I have discussed any further diagnostic evaluation that may be needed or ordered today. We also  reviewed her medications today. she has been encouraged to call the office with any questions or concerns that should arise related to todays visit.    No orders of the defined types were placed in this encounter.   Meds ordered this encounter   Medications  . traMADol (ULTRAM) 50 MG tablet    Sig: Take 1-2 tablets (50-100 mg total) by mouth every 8 (eight) hours as needed for up to 5 days.    Dispense:  15 tablet    Refill:  0    Time spent: [place] AGNP-C Internal medicine

## 2018-06-18 ENCOUNTER — Encounter: Payer: Self-pay | Admitting: Nurse Practitioner

## 2018-06-18 ENCOUNTER — Other Ambulatory Visit (INDEPENDENT_AMBULATORY_CARE_PROVIDER_SITE_OTHER): Payer: Self-pay | Admitting: Nurse Practitioner

## 2018-06-18 DIAGNOSIS — I10 Essential (primary) hypertension: Secondary | ICD-10-CM | POA: Insufficient documentation

## 2018-06-18 DIAGNOSIS — L0233 Carbuncle of buttock: Secondary | ICD-10-CM | POA: Insufficient documentation

## 2018-06-20 ENCOUNTER — Other Ambulatory Visit: Payer: Medicare Other

## 2018-06-23 ENCOUNTER — Other Ambulatory Visit
Admission: RE | Admit: 2018-06-23 | Discharge: 2018-06-23 | Disposition: A | Discharge status: Home / Self Care | Payer: Medicare Other | Source: Ambulatory Visit | Attending: Vascular Surgery | Admitting: Vascular Surgery

## 2018-06-23 ENCOUNTER — Encounter (INDEPENDENT_AMBULATORY_CARE_PROVIDER_SITE_OTHER): Payer: Self-pay

## 2018-06-23 ENCOUNTER — Telehealth (INDEPENDENT_AMBULATORY_CARE_PROVIDER_SITE_OTHER): Payer: Self-pay

## 2018-06-23 DIAGNOSIS — Z1159 Encounter for screening for other viral diseases: Secondary | ICD-10-CM | POA: Insufficient documentation

## 2018-06-23 NOTE — Telephone Encounter (Signed)
Spoke with the patient's husband this morning regarding getting the patient Covid tested and her arrival time for her procedure on 06/25/2018. I asked the patient's husband to come by our office and I gave him a print out of the pre-procedure instructions and went over also.

## 2018-06-24 LAB — NOVEL CORONAVIRUS, NAA (HOSP ORDER, SEND-OUT TO REF LAB; TAT 18-24 HRS): SARS-CoV-2, NAA: NOT DETECTED

## 2018-06-24 MED ORDER — CEFAZOLIN SODIUM-DEXTROSE 2-4 GM/100ML-% IV SOLN
2.0000 g | Freq: Once | INTRAVENOUS | Status: AC
  Administered 2018-06-25: 2 g via INTRAVENOUS

## 2018-06-25 ENCOUNTER — Other Ambulatory Visit: Payer: Self-pay

## 2018-06-25 ENCOUNTER — Inpatient Hospital Stay: Payer: Medicare Other

## 2018-06-25 ENCOUNTER — Other Ambulatory Visit: Payer: Medicare Other

## 2018-06-25 ENCOUNTER — Encounter: Payer: Self-pay | Admitting: Certified Registered"

## 2018-06-25 ENCOUNTER — Encounter
Admission: RE | Disposition: A | Discharge status: Home / Self Care | Payer: Self-pay | Source: Home / Self Care | Attending: Vascular Surgery

## 2018-06-25 ENCOUNTER — Inpatient Hospital Stay
Admission: RE | Admit: 2018-06-25 | Discharge: 2018-06-27 | DRG: 035 | Disposition: A | Discharge status: Home / Self Care | Payer: Medicare Other | Attending: Vascular Surgery | Admitting: Vascular Surgery

## 2018-06-25 DIAGNOSIS — Z888 Allergy status to other drugs, medicaments and biological substances status: Secondary | ICD-10-CM

## 2018-06-25 DIAGNOSIS — I129 Hypertensive chronic kidney disease with stage 1 through stage 4 chronic kidney disease, or unspecified chronic kidney disease: Secondary | ICD-10-CM | POA: Diagnosis not present

## 2018-06-25 DIAGNOSIS — Z79899 Other long term (current) drug therapy: Secondary | ICD-10-CM | POA: Diagnosis not present

## 2018-06-25 DIAGNOSIS — I2510 Atherosclerotic heart disease of native coronary artery without angina pectoris: Secondary | ICD-10-CM | POA: Diagnosis present

## 2018-06-25 DIAGNOSIS — H409 Unspecified glaucoma: Secondary | ICD-10-CM | POA: Diagnosis present

## 2018-06-25 DIAGNOSIS — E1151 Type 2 diabetes mellitus with diabetic peripheral angiopathy without gangrene: Secondary | ICD-10-CM | POA: Diagnosis not present

## 2018-06-25 DIAGNOSIS — L930 Discoid lupus erythematosus: Secondary | ICD-10-CM | POA: Diagnosis present

## 2018-06-25 DIAGNOSIS — E1122 Type 2 diabetes mellitus with diabetic chronic kidney disease: Secondary | ICD-10-CM | POA: Diagnosis not present

## 2018-06-25 DIAGNOSIS — Z853 Personal history of malignant neoplasm of breast: Secondary | ICD-10-CM

## 2018-06-25 DIAGNOSIS — R4701 Aphasia: Secondary | ICD-10-CM | POA: Diagnosis not present

## 2018-06-25 DIAGNOSIS — I70213 Atherosclerosis of native arteries of extremities with intermittent claudication, bilateral legs: Secondary | ICD-10-CM | POA: Diagnosis not present

## 2018-06-25 DIAGNOSIS — Z8673 Personal history of transient ischemic attack (TIA), and cerebral infarction without residual deficits: Secondary | ICD-10-CM

## 2018-06-25 DIAGNOSIS — K219 Gastro-esophageal reflux disease without esophagitis: Secondary | ICD-10-CM | POA: Diagnosis present

## 2018-06-25 DIAGNOSIS — I150 Renovascular hypertension: Secondary | ICD-10-CM | POA: Diagnosis present

## 2018-06-25 DIAGNOSIS — H539 Unspecified visual disturbance: Secondary | ICD-10-CM | POA: Diagnosis not present

## 2018-06-25 DIAGNOSIS — I6522 Occlusion and stenosis of left carotid artery: Secondary | ICD-10-CM

## 2018-06-25 DIAGNOSIS — J449 Chronic obstructive pulmonary disease, unspecified: Secondary | ICD-10-CM | POA: Diagnosis not present

## 2018-06-25 DIAGNOSIS — E785 Hyperlipidemia, unspecified: Secondary | ICD-10-CM | POA: Diagnosis present

## 2018-06-25 DIAGNOSIS — R4182 Altered mental status, unspecified: Secondary | ICD-10-CM | POA: Diagnosis not present

## 2018-06-25 DIAGNOSIS — F0390 Unspecified dementia without behavioral disturbance: Secondary | ICD-10-CM | POA: Diagnosis present

## 2018-06-25 DIAGNOSIS — G4733 Obstructive sleep apnea (adult) (pediatric): Secondary | ICD-10-CM | POA: Diagnosis not present

## 2018-06-25 DIAGNOSIS — N179 Acute kidney failure, unspecified: Secondary | ICD-10-CM | POA: Diagnosis not present

## 2018-06-25 DIAGNOSIS — Z87891 Personal history of nicotine dependence: Secondary | ICD-10-CM

## 2018-06-25 DIAGNOSIS — Z801 Family history of malignant neoplasm of trachea, bronchus and lung: Secondary | ICD-10-CM

## 2018-06-25 DIAGNOSIS — R531 Weakness: Secondary | ICD-10-CM | POA: Diagnosis not present

## 2018-06-25 DIAGNOSIS — Z95828 Presence of other vascular implants and grafts: Secondary | ICD-10-CM | POA: Diagnosis not present

## 2018-06-25 DIAGNOSIS — Z7902 Long term (current) use of antithrombotics/antiplatelets: Secondary | ICD-10-CM

## 2018-06-25 DIAGNOSIS — N183 Chronic kidney disease, stage 3 (moderate): Secondary | ICD-10-CM | POA: Diagnosis present

## 2018-06-25 DIAGNOSIS — T508X5A Adverse effect of diagnostic agents, initial encounter: Secondary | ICD-10-CM | POA: Diagnosis not present

## 2018-06-25 DIAGNOSIS — Z955 Presence of coronary angioplasty implant and graft: Secondary | ICD-10-CM

## 2018-06-25 DIAGNOSIS — Z8249 Family history of ischemic heart disease and other diseases of the circulatory system: Secondary | ICD-10-CM

## 2018-06-25 DIAGNOSIS — J45909 Unspecified asthma, uncomplicated: Secondary | ICD-10-CM | POA: Diagnosis present

## 2018-06-25 DIAGNOSIS — Z7951 Long term (current) use of inhaled steroids: Secondary | ICD-10-CM

## 2018-06-25 DIAGNOSIS — Z794 Long term (current) use of insulin: Secondary | ICD-10-CM

## 2018-06-25 DIAGNOSIS — I6529 Occlusion and stenosis of unspecified carotid artery: Secondary | ICD-10-CM | POA: Diagnosis present

## 2018-06-25 DIAGNOSIS — Z806 Family history of leukemia: Secondary | ICD-10-CM

## 2018-06-25 DIAGNOSIS — I6523 Occlusion and stenosis of bilateral carotid arteries: Principal | ICD-10-CM | POA: Diagnosis present

## 2018-06-25 DIAGNOSIS — I701 Atherosclerosis of renal artery: Secondary | ICD-10-CM | POA: Diagnosis not present

## 2018-06-25 DIAGNOSIS — Z833 Family history of diabetes mellitus: Secondary | ICD-10-CM

## 2018-06-25 HISTORY — PX: CAROTID ANGIOGRAPHY: CATH118230

## 2018-06-25 HISTORY — PX: CAROTID PTA/STENT INTERVENTION: CATH118231

## 2018-06-25 LAB — GLUCOSE, CAPILLARY
Glucose-Capillary: 91 mg/dL (ref 70–99)
Glucose-Capillary: 98 mg/dL (ref 70–99)
Glucose-Capillary: 88 mg/dL (ref 70–99)
Glucose-Capillary: 111 mg/dL — ABNORMAL HIGH (ref 70–99)
Glucose-Capillary: 133 mg/dL — ABNORMAL HIGH (ref 70–99)

## 2018-06-25 LAB — POCT ACTIVATED CLOTTING TIME: Activated Clotting Time: 230 seconds

## 2018-06-25 LAB — CREATININE, SERUM
Creatinine, Ser: 1.81 mg/dL — ABNORMAL HIGH (ref 0.44–1.00)
GFR calc Af Amer: 29 mL/min — ABNORMAL LOW (ref >60)
GFR calc non Af Amer: 25 mL/min — ABNORMAL LOW (ref >60)

## 2018-06-25 LAB — BUN: BUN: 57 mg/dL — ABNORMAL HIGH (ref 8–23)

## 2018-06-25 LAB — MRSA PCR SCREENING: MRSA by PCR: NEGATIVE

## 2018-06-25 SURGERY — CAROTID ANGIOGRAPHY
Anesthesia: Moderate Sedation | Laterality: Left

## 2018-06-25 SURGERY — CAROTID PTA/STENT INTERVENTION
Anesthesia: Moderate Sedation | Laterality: Left

## 2018-06-25 MED ORDER — ACETAMINOPHEN 325 MG PO TABS
325.0000 mg | ORAL_TABLET | ORAL | Status: DC | PRN
  Administered 2018-06-25: 650 mg via ORAL
  Filled 2018-06-25: qty 2

## 2018-06-25 MED ORDER — ROPINIROLE HCL 1 MG PO TABS
0.5000 mg | ORAL_TABLET | Freq: Every day | ORAL | Status: DC
  Administered 2018-06-25 – 2018-06-26 (×2): 0.5 mg via ORAL
  Filled 2018-06-25 (×2): qty 2

## 2018-06-25 MED ORDER — CEFAZOLIN SODIUM-DEXTROSE 2-4 GM/100ML-% IV SOLN
2.0000 g | Freq: Three times a day (TID) | INTRAVENOUS | Status: AC
  Administered 2018-06-25 (×2): 2 g via INTRAVENOUS
  Filled 2018-06-25 (×2): qty 100

## 2018-06-25 MED ORDER — SODIUM CHLORIDE 0.9 % IV SOLN: INTRAVENOUS | Status: DC

## 2018-06-25 MED ORDER — ONDANSETRON HCL 4 MG/2ML IJ SOLN: 4.0000 mg | Freq: Four times a day (QID) | INTRAMUSCULAR | Status: DC | PRN

## 2018-06-25 MED ORDER — MIDAZOLAM HCL 2 MG/2ML IJ SOLN
INTRAMUSCULAR | Status: DC | PRN
  Administered 2018-06-25: 1 mg via INTRAVENOUS

## 2018-06-25 MED ORDER — ATROPINE SULFATE 1 MG/10ML IJ SOSY
PREFILLED_SYRINGE | INTRAMUSCULAR | Status: AC
  Filled 2018-06-25: qty 10

## 2018-06-25 MED ORDER — FERROUS SULFATE 325 (65 FE) MG PO TABS
325.0000 mg | ORAL_TABLET | ORAL | Status: DC
  Filled 2018-06-25: qty 1

## 2018-06-25 MED ORDER — ASPIRIN EC 81 MG PO TBEC
81.0000 mg | DELAYED_RELEASE_TABLET | Freq: Every day | ORAL | Status: DC
  Administered 2018-06-25 – 2018-06-27 (×3): 81 mg via ORAL
  Filled 2018-06-25 (×3): qty 1

## 2018-06-25 MED ORDER — CEPHALEXIN 500 MG PO CAPS
500.0000 mg | ORAL_CAPSULE | Freq: Three times a day (TID) | ORAL | Status: DC
  Filled 2018-06-25 (×3): qty 1

## 2018-06-25 MED ORDER — DOPAMINE-DEXTROSE 3.2-5 MG/ML-% IV SOLN
INTRAVENOUS | Status: AC
  Filled 2018-06-25: qty 250

## 2018-06-25 MED ORDER — SODIUM CHLORIDE 0.9 % IV SOLN
INTRAVENOUS | Status: DC
  Administered 2018-06-25: 10:00:00 via INTRAVENOUS

## 2018-06-25 MED ORDER — DIGOXIN 125 MCG PO TABS
0.1250 mg | ORAL_TABLET | Freq: Every day | ORAL | Status: DC
  Filled 2018-06-25 (×3): qty 1

## 2018-06-25 MED ORDER — CALCIUM CARBONATE 1500 (600 CA) MG PO TABS
600.0000 mg | ORAL_TABLET | ORAL | Status: DC
  Filled 2018-06-25: qty 1

## 2018-06-25 MED ORDER — FERROUS SULFATE 325 (65 FE) MG PO TABS
325.0000 mg | ORAL_TABLET | ORAL | Status: DC
  Administered 2018-06-26: 325 mg via ORAL
  Filled 2018-06-25 (×2): qty 1

## 2018-06-25 MED ORDER — DOCUSATE SODIUM 100 MG PO CAPS
100.0000 mg | ORAL_CAPSULE | Freq: Every day | ORAL | Status: DC
  Administered 2018-06-26 – 2018-06-27 (×2): 100 mg via ORAL
  Filled 2018-06-25 (×2): qty 1

## 2018-06-25 MED ORDER — ATORVASTATIN CALCIUM 10 MG PO TABS
10.0000 mg | ORAL_TABLET | Freq: Every day | ORAL | Status: DC
  Administered 2018-06-26: 10 mg via ORAL
  Filled 2018-06-25: qty 1

## 2018-06-25 MED ORDER — SODIUM CHLORIDE 0.9 % IV BOLUS: 500.0000 mL | Freq: Once | INTRAVENOUS | Status: DC

## 2018-06-25 MED ORDER — VITAMIN C 500 MG PO TABS
500.0000 mg | ORAL_TABLET | Freq: Every day | ORAL | Status: DC
  Administered 2018-06-26 – 2018-06-27 (×2): 500 mg via ORAL
  Filled 2018-06-25: qty 1

## 2018-06-25 MED ORDER — DONEPEZIL HCL 5 MG PO TABS
10.0000 mg | ORAL_TABLET | Freq: Every day | ORAL | Status: DC
  Administered 2018-06-25 – 2018-06-26 (×2): 10 mg via ORAL
  Filled 2018-06-25 (×3): qty 2

## 2018-06-25 MED ORDER — ALBUTEROL SULFATE HFA 108 (90 BASE) MCG/ACT IN AERS: 2.0000 | INHALATION_SPRAY | Freq: Four times a day (QID) | RESPIRATORY_TRACT | Status: DC | PRN

## 2018-06-25 MED ORDER — HEPARIN SODIUM (PORCINE) 1000 UNIT/ML IJ SOLN
INTRAMUSCULAR | Status: AC
  Filled 2018-06-25: qty 1

## 2018-06-25 MED ORDER — CEPHALEXIN 500 MG PO CAPS
500.0000 mg | ORAL_CAPSULE | Freq: Three times a day (TID) | ORAL | Status: DC
  Administered 2018-06-26 – 2018-06-27 (×4): 500 mg via ORAL
  Filled 2018-06-25 (×4): qty 1

## 2018-06-25 MED ORDER — HEPARIN SODIUM (PORCINE) 1000 UNIT/ML IJ SOLN
INTRAMUSCULAR | Status: DC | PRN
  Administered 2018-06-25: 3000 [IU] via INTRAVENOUS
  Administered 2018-06-25: 6000 [IU] via INTRAVENOUS

## 2018-06-25 MED ORDER — HYDRALAZINE HCL 20 MG/ML IJ SOLN
5.0000 mg | INTRAMUSCULAR | Status: DC | PRN
  Administered 2018-06-26: 5 mg via INTRAVENOUS
  Filled 2018-06-25: qty 1

## 2018-06-25 MED ORDER — FENTANYL CITRATE (PF) 100 MCG/2ML IJ SOLN
INTRAMUSCULAR | Status: AC
  Filled 2018-06-25: qty 2

## 2018-06-25 MED ORDER — MIDAZOLAM HCL 5 MG/5ML IJ SOLN
INTRAMUSCULAR | Status: AC
  Filled 2018-06-25: qty 5

## 2018-06-25 MED ORDER — MONTELUKAST SODIUM 10 MG PO TABS
10.0000 mg | ORAL_TABLET | Freq: Every day | ORAL | Status: DC
  Administered 2018-06-25 – 2018-06-26 (×2): 10 mg via ORAL
  Filled 2018-06-25 (×2): qty 1

## 2018-06-25 MED ORDER — IODIXANOL 320 MG/ML IV SOLN
INTRAVENOUS | Status: DC | PRN
  Administered 2018-06-25: 100 mL via INTRAVENOUS

## 2018-06-25 MED ORDER — KCL IN DEXTROSE-NACL 20-5-0.9 MEQ/L-%-% IV SOLN
INTRAVENOUS | Status: DC
  Administered 2018-06-25 – 2018-06-26 (×2): via INTRAVENOUS
  Filled 2018-06-25 (×3): qty 1000

## 2018-06-25 MED ORDER — DIPHENHYDRAMINE HCL 50 MG/ML IJ SOLN: 50.0000 mg | Freq: Once | INTRAMUSCULAR | Status: DC | PRN

## 2018-06-25 MED ORDER — PHENYLEPHRINE HCL (PRESSORS) 10 MG/ML IV SOLN
INTRAVENOUS | Status: AC
  Filled 2018-06-25: qty 1

## 2018-06-25 MED ORDER — FENTANYL CITRATE (PF) 100 MCG/2ML IJ SOLN
INTRAMUSCULAR | Status: DC | PRN
  Administered 2018-06-25: 25 ug via INTRAVENOUS
  Administered 2018-06-25: 50 ug via INTRAVENOUS

## 2018-06-25 MED ORDER — ACETAMINOPHEN 325 MG RE SUPP
325.0000 mg | RECTAL | Status: DC | PRN
  Filled 2018-06-25: qty 2

## 2018-06-25 MED ORDER — ESMOLOL HCL-SODIUM CHLORIDE 2000 MG/100ML IV SOLN
INTRAVENOUS | Status: AC
  Filled 2018-06-25: qty 100

## 2018-06-25 MED ORDER — INSULIN ASPART 100 UNIT/ML ~~LOC~~ SOLN
0.0000 [IU] | Freq: Three times a day (TID) | SUBCUTANEOUS | Status: DC
  Administered 2018-06-26: 09:00:00 3 [IU] via SUBCUTANEOUS
  Filled 2018-06-25: qty 1

## 2018-06-25 MED ORDER — MIDAZOLAM HCL 2 MG/ML PO SYRP: 8.0000 mg | ORAL_SOLUTION | Freq: Once | ORAL | Status: DC | PRN

## 2018-06-25 MED ORDER — HYDRALAZINE HCL 25 MG PO TABS
25.0000 mg | ORAL_TABLET | Freq: Two times a day (BID) | ORAL | Status: DC
  Administered 2018-06-25 – 2018-06-27 (×4): 25 mg via ORAL
  Filled 2018-06-25 (×5): qty 1

## 2018-06-25 MED ORDER — ALUM & MAG HYDROXIDE-SIMETH 200-200-20 MG/5ML PO SUSP: 15.0000 mL | ORAL | Status: DC | PRN

## 2018-06-25 MED ORDER — ANASTROZOLE 1 MG PO TABS
1.0000 mg | ORAL_TABLET | Freq: Every evening | ORAL | Status: DC
  Administered 2018-06-25 – 2018-06-26 (×2): 1 mg via ORAL
  Filled 2018-06-25 (×3): qty 1

## 2018-06-25 MED ORDER — ATROPINE SULFATE 1 MG/10ML IJ SOSY
PREFILLED_SYRINGE | INTRAMUSCULAR | Status: DC | PRN
  Administered 2018-06-25: 1 mg via INTRAVENOUS

## 2018-06-25 MED ORDER — PANTOPRAZOLE SODIUM 40 MG PO TBEC
40.0000 mg | DELAYED_RELEASE_TABLET | Freq: Every day | ORAL | Status: DC
  Administered 2018-06-25 – 2018-06-27 (×3): 40 mg via ORAL
  Filled 2018-06-25 (×3): qty 1

## 2018-06-25 MED ORDER — HEART ATTACK BOUNCING BOOK: Freq: Once | Status: DC

## 2018-06-25 MED ORDER — ACETAMINOPHEN 500 MG PO TABS: 1000.0000 mg | ORAL_TABLET | Freq: Four times a day (QID) | ORAL | Status: DC | PRN

## 2018-06-25 MED ORDER — METOPROLOL TARTRATE 5 MG/5ML IV SOLN: 2.0000 mg | INTRAVENOUS | Status: DC | PRN

## 2018-06-25 MED ORDER — SODIUM CHLORIDE 0.9 % IV SOLN
500.0000 mL | Freq: Once | INTRAVENOUS | Status: AC | PRN
  Administered 2018-06-25: 12:00:00 500 mL via INTRAVENOUS

## 2018-06-25 MED ORDER — MAGNESIUM OXIDE 400 (241.3 MG) MG PO TABS
400.0000 mg | ORAL_TABLET | Freq: Every day | ORAL | Status: DC
  Administered 2018-06-25 – 2018-06-27 (×3): 400 mg via ORAL
  Filled 2018-06-25 (×3): qty 1

## 2018-06-25 MED ORDER — IODIXANOL 320 MG/ML IV SOLN
INTRAVENOUS | Status: DC | PRN
  Administered 2018-06-25: 80 mL via INTRA_ARTERIAL

## 2018-06-25 MED ORDER — ISOSORBIDE MONONITRATE ER 30 MG PO TB24
30.0000 mg | ORAL_TABLET | Freq: Every day | ORAL | Status: DC
  Administered 2018-06-26 – 2018-06-27 (×2): 30 mg via ORAL
  Filled 2018-06-25 (×2): qty 1

## 2018-06-25 MED ORDER — DOCUSATE SODIUM 100 MG PO CAPS: 200.0000 mg | ORAL_CAPSULE | Freq: Every evening | ORAL | Status: DC | PRN

## 2018-06-25 MED ORDER — FAMOTIDINE 20 MG PO TABS: 40.0000 mg | ORAL_TABLET | Freq: Once | ORAL | Status: DC | PRN

## 2018-06-25 MED ORDER — METHYLPREDNISOLONE SODIUM SUCC 125 MG IJ SOLR: 125.0000 mg | Freq: Once | INTRAMUSCULAR | Status: DC | PRN

## 2018-06-25 MED ORDER — CHOLECALCIFEROL 10 MCG (400 UNIT) PO TABS
400.0000 [IU] | ORAL_TABLET | ORAL | Status: DC
  Administered 2018-06-25 – 2018-06-27 (×2): 400 [IU] via ORAL
  Filled 2018-06-25 (×2): qty 1

## 2018-06-25 MED ORDER — AMLODIPINE BESYLATE 10 MG PO TABS
10.0000 mg | ORAL_TABLET | Freq: Every day | ORAL | Status: DC
  Administered 2018-06-26 – 2018-06-27 (×2): 10 mg via ORAL
  Filled 2018-06-25: qty 2
  Filled 2018-06-25: qty 1

## 2018-06-25 MED ORDER — ALBUTEROL SULFATE (2.5 MG/3ML) 0.083% IN NEBU: 2.5000 mg | INHALATION_SOLUTION | Freq: Four times a day (QID) | RESPIRATORY_TRACT | Status: DC | PRN

## 2018-06-25 MED ORDER — HYDROMORPHONE HCL 1 MG/ML IJ SOLN: 1.0000 mg | Freq: Once | INTRAMUSCULAR | Status: DC | PRN

## 2018-06-25 MED ORDER — GUAIFENESIN-DM 100-10 MG/5ML PO SYRP: 15.0000 mL | ORAL_SOLUTION | ORAL | Status: DC | PRN

## 2018-06-25 MED ORDER — CALCIUM CARBONATE ANTACID 500 MG PO CHEW
600.0000 mg | CHEWABLE_TABLET | ORAL | Status: DC
  Administered 2018-06-26: 10:00:00 600 mg via ORAL
  Filled 2018-06-25 (×2): qty 3

## 2018-06-25 MED ORDER — CLOPIDOGREL BISULFATE 75 MG PO TABS
75.0000 mg | ORAL_TABLET | Freq: Every day | ORAL | Status: DC
  Administered 2018-06-25 – 2018-06-27 (×3): 75 mg via ORAL
  Filled 2018-06-25 (×3): qty 1

## 2018-06-25 MED ORDER — LABETALOL HCL 5 MG/ML IV SOLN
10.0000 mg | INTRAVENOUS | Status: DC | PRN
  Administered 2018-06-26: 10 mg via INTRAVENOUS
  Filled 2018-06-25: qty 4

## 2018-06-25 MED ORDER — PHENOL 1.4 % MT LIQD
1.0000 | OROMUCOSAL | Status: DC | PRN
  Filled 2018-06-25: qty 177

## 2018-06-25 MED ORDER — HYDROCHLOROTHIAZIDE 25 MG PO TABS
12.5000 mg | ORAL_TABLET | Freq: Every day | ORAL | Status: DC
  Filled 2018-06-25: qty 1

## 2018-06-25 MED ORDER — HEPARIN SODIUM (PORCINE) 1000 UNIT/ML IJ SOLN
3000.0000 [IU] | Freq: Once | INTRAMUSCULAR | Status: AC
  Administered 2018-06-25: 3000 [IU] via INTRAVENOUS

## 2018-06-25 SURGICAL SUPPLY — 17 items
CATH ANGIO 5F 100CM .035 PIG (CATHETERS) ×1 IMPLANT
CATH BEACON 5 .035 100 H1 TIP (CATHETERS) ×1 IMPLANT
DEVICE SAFEGUARD 24CM (GAUZE/BANDAGES/DRESSINGS) ×1 IMPLANT
DEVICE STARCLOSE SE CLOSURE (Vascular Products) ×1 IMPLANT
DEVICE TORQUE .025-.038 (MISCELLANEOUS) ×1 IMPLANT
GLIDEWIRE ANGLED SS 035X260CM (WIRE) ×1 IMPLANT
KIT CAROTID MANIFOLD (MISCELLANEOUS) ×1 IMPLANT
NDL ENTRY 21GA 7CM ECHOTIP (NEEDLE) IMPLANT
NEEDLE ENTRY 21GA 7CM ECHOTIP (NEEDLE) ×2 IMPLANT
PACK ANGIOGRAPHY (CUSTOM PROCEDURE TRAY) ×1 IMPLANT
SET INTRO CAPELLA COAXIAL (SET/KITS/TRAYS/PACK) ×1 IMPLANT
SHEATH BRITE TIP 6FRX11 (SHEATH) ×1 IMPLANT
SHEATH SHUTTLE SELECT 6F (SHEATH) ×1 IMPLANT
SYR MEDRAD MARK 7 150ML (SYRINGE) ×1 IMPLANT
TUBING CONTRAST HIGH PRESS 72 (TUBING) ×1 IMPLANT
WIRE G VAS 035X260 STIFF (WIRE) ×1 IMPLANT
WIRE J 3MM .035X145CM (WIRE) ×1 IMPLANT

## 2018-06-25 SURGICAL SUPPLY — 21 items
BALLN VIATRAC 5X30X135 (BALLOONS) ×2
BALLOON VIATRAC 5X30X135 (BALLOONS) IMPLANT
CATH ANGIO 5F 100CM .035 PIG (CATHETERS) ×1 IMPLANT
CATH BEACON 5 .035 100 H1 TIP (CATHETERS) ×1 IMPLANT
DEVICE EMBOSHIELD NAV6 4.0-7.0 (FILTER) ×1 IMPLANT
DEVICE PRESTO INFLATION (MISCELLANEOUS) ×1 IMPLANT
DEVICE SAFEGUARD 24CM (GAUZE/BANDAGES/DRESSINGS) ×1 IMPLANT
DEVICE STARCLOSE SE CLOSURE (Vascular Products) ×1 IMPLANT
DEVICE TORQUE .025-.038 (MISCELLANEOUS) ×1 IMPLANT
GLIDEWIRE ANGLED SS 035X260CM (WIRE) ×1 IMPLANT
KIT CAROTID MANIFOLD (MISCELLANEOUS) ×1 IMPLANT
NDL ENTRY 21GA 7CM ECHOTIP (NEEDLE) IMPLANT
NEEDLE ENTRY 21GA 7CM ECHOTIP (NEEDLE) ×2 IMPLANT
PACK ANGIOGRAPHY (CUSTOM PROCEDURE TRAY) ×1 IMPLANT
SET INTRO CAPELLA COAXIAL (SET/KITS/TRAYS/PACK) ×1 IMPLANT
SHEATH BRITE TIP 6FRX11 (SHEATH) ×1 IMPLANT
SHEATH SHUTTLE 6FR (SHEATH) ×1 IMPLANT
STENT XACT CAR 10-8X40X136 (Permanent Stent) ×1 IMPLANT
TUBING CONTRAST HIGH PRESS 72 (TUBING) ×1 IMPLANT
WIRE G VAS 035X260 STIFF (WIRE) ×1 IMPLANT
WIRE J 3MM .035X145CM (WIRE) ×1 IMPLANT

## 2018-06-25 NOTE — Progress Notes (Signed)
At 11:35 patient moaning loudly, moaning, shaking, breathing rapidly, states "somethings wrong".  fsbs 95 vss unable to follow commands and pupils slowly responsive, and doesn't respond appropriately to stimuli   Dr. Delana Meyer made aware of changes in condition and dr. Lucky Cowboy at bedside assessing situation. Heparin 3000u given as well as ns bolus of 519ml started.

## 2018-06-25 NOTE — Progress Notes (Signed)
Pt arrived on unit at this time. Pt is now alert and oriented, able to follow commands. SB on monitor and all other vs stable. Pt lying flat on back. Right PAD sight assessed.

## 2018-06-25 NOTE — Op Note (Signed)
[facility]   OPERATIVE NOTE  DATE: 06/25/2018  PRE-OPERATIVE DIAGNOSIS: 1.  Status post left carotid artery stent placement now with a aphasia and right-sided weakness   POST-OPERATIVE DIAGNOSIS: Same as above  PROCEDURE: 1.   Ultrasound Guidance for vascular access right femoral artery 2.   Catheter placement into left common carotid artery from right femoral approach 3.   Thoracic aortogram 4.   Cervical and cerebral left carotid angiogram 5.   StarClose closure device right femoral artery  SURGEON: Hortencia Pilar, MD  ASSISTANT(S): None  ANESTHESIA: Moderate conscious sedation  ESTIMATED BLOOD LOSS: Less than 20 cc  FLUORO TIME: 5.7 minutes  CONTRAST: 80 cc  MODERATE CONSCIOUS SEDATION TIME: Approximately 35 minutes using one half of Versed and 25 Mcg of Fentanyl  FINDING(S): 1.  Widely patent stent no filling defects intracranially were identified.  No evidence of thrombus at the site of intervention.  SPECIMEN(S):  None  INDICATIONS:   Patient is a 85 y.o.female who presents with abrupt onset of a aphasia and right-sided weakness approximately 1 hour after left carotid stenting.  Patient is being returned to the angios suite to verify the stent remains patent and that there are no obvious intracranial abnormalities which could be addressed with interventional techniques.  Risks and benefits are discussed and informed consent was obtained.  DESCRIPTION: After obtaining full informed written consent, the patient was brought back to the operating room and placed supine upon the vascular suite table.  After obtaining adequate anesthesia, the patient was prepped and draped in the standard fashion.  Moderate conscious sedation was administered during a face to face encounter with the patient throughout the procedure with my supervision of the RN administering medicines and monitoring the patients vital signs and mental status throughout from the start  of the procedure until the patient was taken to the recovery room.  The right femoral artery was visualized with ultrasound and found to be calcific but patent. It was then accessed under direct ultrasound guidance without difficulty with a micropuncture needle.  Microwire followed by micro-sheath is inserted under fluoroscopic guidance.  A J-wire and 5 French sheath were placed and a permanent image was recorded.   A pigtail catheter was placed into the ascending aorta and an LAO projection thoracic aortogram was performed. This showed arch is unchanged type I widely patent ostia of the great vessels.  I then selected a H1 catheter and cannulated the left common carotid artery. Cervical and cerebral carotid angiography were then performed on the left side. The intracranial flow was found to be unchanged no obvious filling defects identified. The cervical carotid artery was widely patent with a stent in good position no abnormalities identified.   The diagnostic catheter was removed. Oblique arteriogram was performed of the right femoral artery and StarClose closure device was deployed in usual fashion with excellent hemostatic result. The patient tolerated the procedure well and was taken to the recovery room in stable condition.  Emergent noncontrasted CT will be obtained to exclude a intracranial bleed after which Aggrastat will be started and blood pressure will continue to be managed with the intent of keeping her blood pressure between 130 and 726 mmHg systolic.  COMPLICATIONS: None  CONDITION: Stable   Hortencia Pilar 06/25/2018 12:51 PM   This note was created with Dragon Medical transcription system. Any errors in dictation are purely unintentional.

## 2018-06-25 NOTE — Op Note (Signed)
OPERATIVE NOTE DATE: 06/25/2018  PROCEDURE: 1.  Ultrasound guidance for vascular access right femoral artery 2.  Placement of a 10 x 8 exact stent with the use of the NAV-6 embolic protection device in the left carotid artery  PRE-OPERATIVE DIAGNOSIS: 1.  Critical greater than 90% stenosis of the left internal carotid artery. 2.  Visual changes  POST-OPERATIVE DIAGNOSIS:  Same as above  SURGEON: Hortencia Pilar, MD  ASSISTANT(S): Erskine Squibb dew, MD  ANESTHESIA: local/MCS  ESTIMATED BLOOD LOSS: 75 cc  CONTRAST: 80 cc  FLUORO TIME: 11.4 minutes  MODERATE CONSCIOUS SEDATION TIME: Continuous ECG pulse oximetry and cardiopulmonary monitoring was performed throughout the entire procedure by the interventional radiology nurse total sedation time was 70 minutes  FINDING(S): 1.   Greater than 90% stenosis left internal carotid artery stenosis  SPECIMEN(S):   none  INDICATIONS:   Patient is a 85 y.o. female who presents with critical left internal carotid artery stenosis.  The patient has a very high bifurcation with a lengthy lesion that extends to C1 and carotid artery stenting was felt to be preferred to endarterectomy for that reason.  Risks and benefits were discussed and informed consent was obtained.   DESCRIPTION: After obtaining full informed written consent, the patient was brought back to the vascular suite and placed supine upon the table.  The patient received IV antibiotics prior to induction. Moderate conscious sedation was administered during a face to face encounter with the patient throughout the procedure with my supervision of the RN administering medicines and monitoring the patients vital signs and mental status throughout from the start of the procedure until the patient was taken to the recovery room.    After obtaining adequate sedation, the patient was prepped and draped in the standard fashion.    A first assistant is required in order to allow for a safe and  more efficient operation.  Duties include wire manipulations as well as assistance with pinning the sheath and positioning the detector for proper angle, assistance and deploying the stent in the proper position and appropriate images.  Further duties include assisting with patient positioning during the procedure.  I believe that this procedure requires a first assistant in order for it to be performed at a level in keeping with the high standards of this institution.  The right femoral artery was visualized with ultrasound and found to be widely patent. It was then accessed under direct ultrasound guidance without difficulty with a micropuncture needle. A permanent image was recorded.  A microwire was then advanced without difficulty under fluoroscopic guidance followed by a micro-sheath.  A J-wire was placed and we then placed a 6 French sheath. The patient was then heparinized and a total of 6000 units of heparin with an additional 3000 units of intravenous heparin were given and an ACT was checked to confirm successful anticoagulation.   A pigtail catheter was then placed into the ascending aorta. This showed a type I arch no evidence of hemodynamically significant ostial stenosis of the great vessels. The left artery was then selectively cannulated without difficulty with a H1 catheter and the catheter advanced into the mid left common carotid artery.  Cervical and cerebral carotid angiography was then performed. There were no obvious intracranial filling defects. The carotid bifurcation demonstrated greater than 90% stenosis of the left internal carotid artery extending over 2 cm.  I then advanced into the external carotid artery with a Glidewire and the H1 catheter and then exchanged for the  Amplatz Super Stiff wire. Over the Amplatz Super Stiff wire, a 6 [place] shuttle sheath was placed into the mid common carotid artery. I then used the NAV-6  Embolic protection device and crossed the lesion and parked  this in the distal internal carotid artery at the base of the skull.  I then selected a 10 x 8 exact stent. This was deployed across the lesion encompassing it in its entirety. A 5 mm diameter by 30 mm length balloon was used to post dilate the stent. Only about a 15 % residual stenosis was present after angioplasty. Completion angiogram showed normal intracranial filling without new defects. At this point I elected to terminate the procedure. The sheath was removed and StarClose closure device was deployed in the right femoral artery with excellent hemostatic result. The patient was taken to the recovery room in stable condition having tolerated the procedure well.  COMPLICATIONS: none  CONDITION: stable  Hortencia Pilar 06/25/2018 11:22 AM   This note was created with Dragon Medical transcription system. Any errors in dictation are purely unintentional.

## 2018-06-25 NOTE — H&P (Signed)
[facility] History & Physical Update  The patient was interviewed and re-examined.  The patient's previous History and Physical has been reviewed and is unchanged.  There is no change in the plan of care. We plan to proceed with the scheduled procedure.  Hortencia Pilar, MD  06/25/2018, 9:47 AM

## 2018-06-26 ENCOUNTER — Encounter: Payer: Self-pay | Admitting: Vascular Surgery

## 2018-06-26 DIAGNOSIS — Z9889 Other specified postprocedural states: Secondary | ICD-10-CM

## 2018-06-26 DIAGNOSIS — I6522 Occlusion and stenosis of left carotid artery: Secondary | ICD-10-CM

## 2018-06-26 LAB — CBC
HCT: 28.8 % — ABNORMAL LOW (ref 36.0–46.0)
Hemoglobin: 8.8 g/dL — ABNORMAL LOW (ref 12.0–15.0)
MCH: 25.8 pg — ABNORMAL LOW (ref 26.0–34.0)
MCHC: 30.6 g/dL (ref 30.0–36.0)
MCV: 84.5 fL (ref 80.0–100.0)
Platelets: 214 10*3/uL (ref 150–400)
RBC: 3.41 MIL/uL — ABNORMAL LOW (ref 3.87–5.11)
RDW: 15.8 % — ABNORMAL HIGH (ref 11.5–15.5)
WBC: 8.6 10*3/uL (ref 4.0–10.5)
nRBC: 0 % (ref 0.0–0.2)

## 2018-06-26 LAB — BASIC METABOLIC PANEL
Anion gap: 6 (ref 5–15)
BUN: 44 mg/dL — ABNORMAL HIGH (ref 8–23)
CO2: 21 mmol/L — ABNORMAL LOW (ref 22–32)
Calcium: 8.7 mg/dL — ABNORMAL LOW (ref 8.9–10.3)
Chloride: 115 mmol/L — ABNORMAL HIGH (ref 98–111)
Creatinine, Ser: 1.99 mg/dL — ABNORMAL HIGH (ref 0.44–1.00)
GFR calc Af Amer: 26 mL/min — ABNORMAL LOW (ref >60)
GFR calc non Af Amer: 23 mL/min — ABNORMAL LOW (ref >60)
Glucose, Bld: 182 mg/dL — ABNORMAL HIGH (ref 70–99)
Potassium: 4.9 mmol/L (ref 3.5–5.1)
Sodium: 142 mmol/L (ref 135–145)

## 2018-06-26 LAB — GLUCOSE, CAPILLARY
Glucose-Capillary: 175 mg/dL — ABNORMAL HIGH (ref 70–99)
Glucose-Capillary: 191 mg/dL — ABNORMAL HIGH (ref 70–99)
Glucose-Capillary: 136 mg/dL — ABNORMAL HIGH (ref 70–99)
Glucose-Capillary: 154 mg/dL — ABNORMAL HIGH (ref 70–99)

## 2018-06-26 MED ORDER — INSULIN ASPART 100 UNIT/ML ~~LOC~~ SOLN
0.0000 [IU] | Freq: Three times a day (TID) | SUBCUTANEOUS | Status: DC
  Administered 2018-06-26: 17:00:00 3 [IU] via SUBCUTANEOUS
  Administered 2018-06-26 – 2018-06-27 (×3): 4 [IU] via SUBCUTANEOUS
  Filled 2018-06-26 (×4): qty 1

## 2018-06-26 MED ORDER — INSULIN ASPART 100 UNIT/ML ~~LOC~~ SOLN: 0.0000 [IU] | Freq: Every day | SUBCUTANEOUS | Status: DC

## 2018-06-26 NOTE — Progress Notes (Signed)
[facility] Daily Progress Note   Subjective: 1 Day Post-Op: 1.   Ultrasound Guidance for vascular access right femoral artery 2.   Catheter placement into left common carotid artery from right femoral approach 3.   Thoracic aortogram 4.   Cervical and cerebral left carotid angiogram 5.   StarClose closure device right femoral artery  Patient with some post procedure strokelike symptoms yesterday afternoon.  This has completely resolved. CT head: No acute findings. Mild age related atrophy and chronic ischemic microvascular disease. Small old left thalamic lacunar infarct.  Patient is without complaint this a.m.  No issues overnight.  Objective: Vitals:   06/26/18 0200 06/26/18 0300 06/26/18 0400 06/26/18 0500  BP: (!) 152/31 (!) 168/68 (!) 160/34 (!) 179/39  Pulse: (!) 41 (!) 42 (!) 45 (!) 40  Resp: 19 (!) 23 20 (!) 22  Temp:   99.3 F (37.4 C)   TempSrc:   Oral   SpO2: 97% 98% 97% 99%  Weight:      Height:        Intake/Output Summary (Last 24 hours) at 06/26/2018 1023 Last data filed at 06/26/2018 0600 Gross per 24 hour  Intake 1406.51 ml  Output 800 ml  Net 606.51 ml   Physical Exam: A&Ox3, NAD Face: Symmetrical.  Tongue is midline. Neck: Trachea midline.  No swelling or ecchymosis noted. CV: RRR Pulmonary: CTA Bilaterally Abdomen: Soft, Nontender, Nondistended Right Groin: Access site is clean dry and intact.  No swelling or ecchymosis noted. Vascular: Warm, nontender, minimal edema Neuro: Intact.  Upper/lower, right/left 5 out of 5. Intact.    Laboratory: CBC    Component Value Date/Time   WBC 8.6 06/26/2018 0233   HGB 8.8 (L) 06/26/2018 0233   HGB 10.2 (L) 04/10/2017 0847   HCT 28.8 (L) 06/26/2018 0233   HCT 32.3 (L) 04/10/2017 0847   PLT 214 06/26/2018 0233   PLT 390 (H) 04/10/2017 0847   BMET    Component Value Date/Time   NA 142 06/26/2018 0233   NA 141 12/17/2017 1032   NA 142 05/20/2013 0509   K 4.9 06/26/2018 0233   K  3.7 05/20/2013 0509   CL 115 (H) 06/26/2018 0233   CL 108 (H) 05/20/2013 0509   CO2 21 (L) 06/26/2018 0233   CO2 30 05/20/2013 0509   GLUCOSE 182 (H) 06/26/2018 0233   GLUCOSE 108 (H) 05/20/2013 0509   BUN 44 (H) 06/26/2018 0233   BUN 31 (H) 12/17/2017 1032   BUN 13 05/20/2013 0509   CREATININE 1.99 (H) 06/26/2018 0233   CREATININE 0.92 05/20/2013 0509   CALCIUM 8.7 (L) 06/26/2018 0233   CALCIUM 8.9 05/20/2013 0509   GFRNONAA 23 (L) 06/26/2018 0233   GFRNONAA >60 05/20/2013 0509   GFRAA 26 (L) 06/26/2018 0233   GFRAA >60 05/20/2013 0509   Assessment/Planning: The patient is an 85 year old female with carotid artery disease status post left carotid artery stent placement postop day 1 1) doing well.  Transfer out of the ICU into the surgical floor overnight for blood pressure monitoring - would like to keep around 160's 2) will order physical therapy.  Encourage ambulation 3) AM labs - watch CBC and renal function  Discussed with Dr. Eber Hong Stegmayer PA-C 06/26/2018 10:23 AM

## 2018-06-26 NOTE — Consult Note (Signed)
[facility]  CONSULT NOTE    Date: 06/26/2018                  Patient Name:  Jennifer Carrillo  MRN: [id number]  DOB: 02-03-1933  Age / Sex: 85 y.o., female         PCP: [name], MD                 Service Requesting Consult: Dr. Delana Meyer                 Reason for Consult: Acute renal failure            History of Present Illness: Patient underwent left carotid stent placement by Dr. Delana Meyer on 6/17. Post procedure patient had altered mental status. Patient is now back to baseline.   Medications: Outpatient medications: Medications Prior to Admission  Medication Sig Dispense Refill Last Dose  . acetaminophen (TYLENOL) 500 MG tablet Take 1,000 mg by mouth every 6 (six) hours as needed for moderate pain or headache.   06/24/2018 at Unknown time  . amLODipine (NORVASC) 10 MG tablet Take 1 tablet (10 mg total) by mouth daily. 90 tablet 3 06/25/2018 at 0830  . anastrozole (ARIMIDEX) 1 MG tablet Take 1 mg by mouth every evening.    06/24/2018 at Unknown time  . atorvastatin (LIPITOR) 10 MG tablet Take 1 tablet (10 mg total) by mouth daily with supper. (Patient taking differently: Take 10 mg by mouth daily at 3 pm. ) 90 tablet 3 06/24/2018 at Unknown time  . calcium carbonate (OSCAL) 1500 (600 Ca) MG TABS tablet Take 600 mg by mouth 4 (four) times a week.    06/23/2018  . cholecalciferol (VITAMIN D-400) 10 MCG (400 UNIT) TABS tablet Take 400 Units by mouth 3 (three) times a week.     . clopidogrel (PLAVIX) 75 MG tablet TAKE 1 TABLET BY MOUTH DAILY (Patient taking differently: Take 75 mg by mouth daily with supper. ) 90 tablet 1 06/24/2018 at Unknown time  . CRANBERRY PO Take 4,200 mg by mouth 4 (four) times a week.   Past Week at Unknown time  . digoxin (LANOXIN) 0.125 MG tablet Take 1 tablet (0.125 mg total) by mouth daily. 90 tablet 3 06/25/2018 at 0830  . donepezil (ARICEPT) 10 MG tablet Take 1 tablet (10 mg total) by mouth at bedtime. Take one tab po qhs for  memory 90 tablet 2 Past Week at Unknown time  . ferrous sulfate 325 (65 FE) MG EC tablet Take 325 mg by mouth 4 (four) times a week.    06/21/2018  . hydrALAZINE (APRESOLINE) 25 MG tablet Take 1 tablet (25 mg total) by mouth 2 (two) times daily. 180 tablet 1 06/25/2018 at Unknown time  . hydrochlorothiazide (HYDRODIURIL) 12.5 MG tablet Take 12.5 mg by mouth daily.   06/24/2018 at Unknown time  . ibandronate (BONIVA) 150 MG tablet Take 150 mg by mouth every 30 (thirty) days. Take in the morning with a full glass of water, on an empty stomach, and do not take anything else by mouth or lie down for the next 30 min.   05/09/2018  . insulin NPH-regular Human (70-30) 100 UNIT/ML injection Inject 30 Units into the skin 2 (two) times daily.   06/25/2018 at Unknown time  . isosorbide mononitrate (IMDUR) 30 MG 24 hr tablet Take 30 mg by mouth daily.    06/24/2018 at Unknown time  . magnesium oxide (MAG-OX) 400 (241.3 Mg)  MG tablet Take 1 tablet (400 mg total) by mouth daily. (Patient taking differently: Take 400 mg by mouth daily at 3 pm. ) 90 tablet 3   . montelukast (SINGULAIR) 10 MG tablet Take 1 tablet (10 mg total) by mouth at bedtime. (Patient taking differently: Take 10 mg by mouth daily as needed (allergies.). ) 90 tablet 3 Past Month at Unknown time  . pantoprazole (PROTONIX) 40 MG tablet Take 1 tablet (40 mg total) by mouth daily. (Patient taking differently: Take 40 mg by mouth daily before breakfast. ) 90 tablet 1 06/24/2018 at Unknown time  . PROAIR HFA 108 (90 Base) MCG/ACT inhaler Inhale 2 puffs into the lungs every 6 (six) hours as needed for wheezing or shortness of breath.    Past Week at Unknown time  . rOPINIRole (REQUIP) 0.5 MG tablet Take 1 tablet (0.5 mg total) by mouth at bedtime. 90 tablet 2 06/24/2018 at Unknown time  . telmisartan (MICARDIS) 80 MG tablet Take 1 tablet (80 mg total) by mouth daily. (Patient taking differently: Take 80 mg by mouth every evening. ) 90 tablet 3 06/24/2018 at Unknown  time  . vitamin C (ASCORBIC ACID) 500 MG tablet Take by mouth daily.    06/22/2018  . ACCU-CHEK FASTCLIX LANCETS MISC by Does not apply route 3 (three) times daily after meals.     . cephALEXin (KEFLEX) 500 MG capsule Take 1 capsule (500 mg total) by mouth 3 (three) times daily. (Patient not taking: Reported on 06/25/2018) 30 capsule 0 06/23/2018 at Unknown time  . docusate sodium (COLACE) 100 MG capsule Take 200 mg by mouth at bedtime as needed for mild constipation.    06/22/2018  . glucose blood (ACCU-CHEK SMARTVIEW) test strip 3 each by Other route 3 (three) times daily after meals. Use as instructed     . lidocaine (XYLOCAINE) 2 % solution Apply very small amount to affected area QID prn pain (Patient not taking: Reported on 06/20/2018) 15 mL 0 Not Taking at Unknown time    Current medications: Current Facility-Administered Medications  Medication Dose Route Frequency Provider Last Rate Last Dose  . acetaminophen (TYLENOL) tablet 325-650 mg  325-650 mg Oral Q4H PRN Schnier, [name], MD   650 mg at 06/25/18 2153   Or  . acetaminophen (TYLENOL) suppository 325-650 mg  325-650 mg Rectal Q4H PRN Schnier, [name], MD      . albuterol (PROVENTIL) (2.5 MG/3ML) 0.083% nebulizer solution 2.5 mg  2.5 mg Nebulization Q6H PRN Charlett Nose, RPH      . alum & mag hydroxide-simeth (MAALOX/MYLANTA) 200-200-20 MG/5ML suspension 15-30 mL  15-30 mL Oral Q2H PRN Schnier, [name], MD      . amLODipine (NORVASC) tablet 10 mg  10 mg Oral Daily Schnier, [name], MD   10 mg at 06/26/18 1000  . anastrozole (ARIMIDEX) tablet 1 mg  1 mg Oral QPM Schnier, [name], MD   1 mg at 06/25/18 1635  . aspirin EC tablet 81 mg  81 mg Oral Daily Schnier, [name], MD   81 mg at 06/26/18 1000  . atorvastatin (LIPITOR) tablet 10 mg  10 mg Oral Q supper Schnier, [name], MD      . calcium carbonate (TUMS - dosed in mg elemental calcium) chewable tablet 600 mg of elemental calcium  600 mg of elemental calcium Oral 4  times weekly Schnier, [name], MD   600 mg of elemental calcium at 06/26/18 1000  . cephALEXin (KEFLEX) capsule 500  mg  500 mg Oral TID Charlett Nose, RPH   500 mg at 06/26/18 1000  . cholecalciferol (VITAMIN D3) tablet 400 Units  400 Units Oral 3 times weekly Delana Meyer [name], MD   400 Units at 06/25/18 1634  . clopidogrel (PLAVIX) tablet 75 mg  75 mg Oral Daily Schnier, [name], MD   75 mg at 06/26/18 1000  . digoxin (LANOXIN) tablet 0.125 mg  0.125 mg Oral Daily Schnier, [name], MD      . docusate sodium (COLACE) capsule 100 mg  100 mg Oral Daily Schnier, [name], MD   100 mg at 06/26/18 1000  . docusate sodium (COLACE) capsule 200 mg  200 mg Oral QHS PRN Schnier, [name], MD      . donepezil (ARICEPT) tablet 10 mg  10 mg Oral QHS Schnier, [name], MD   10 mg at 06/25/18 2147  . ferrous sulfate tablet 325 mg  325 mg Oral 4 times weekly Delana Meyer [name], MD   325 mg at 06/26/18 1000  . guaiFENesin-dextromethorphan (ROBITUSSIN DM) 100-10 MG/5ML syrup 15 mL  15 mL Oral Q4H PRN Schnier, [name], MD      . hydrALAZINE (APRESOLINE) injection 5 mg  5 mg Intravenous Q20 Min PRN Schnier, [name], MD      . hydrALAZINE (APRESOLINE) tablet 25 mg  25 mg Oral BID Schnier, [name], MD   25 mg at 06/26/18 1000  . insulin aspart (novoLOG) injection 0-20 Units  0-20 Units Subcutaneous TID WC Stegmayer, Kimberly A, PA-C   4 Units at 06/26/18 1230  . insulin aspart (novoLOG) injection 0-5 Units  0-5 Units Subcutaneous QHS Stegmayer, Kimberly A, PA-C      . isosorbide mononitrate (IMDUR) 24 hr tablet 30 mg  30 mg Oral Daily Schnier, [name], MD   30 mg at 06/26/18 1000  . labetalol (NORMODYNE) injection 10 mg  10 mg Intravenous Q10 min PRN Schnier, [name], MD      . magnesium oxide (MAG-OX) tablet 400 mg  400 mg Oral Daily Schnier, [name], MD   400 mg at 06/26/18 1000  . metoprolol tartrate (LOPRESSOR) injection 2-5 mg  2-5 mg Intravenous Q2H PRN Schnier, [name], MD      .  montelukast (SINGULAIR) tablet 10 mg  10 mg Oral QHS Schnier, [name], MD   10 mg at 06/25/18 2146  . ondansetron (ZOFRAN) injection 4 mg  4 mg Intravenous Q6H PRN Schnier, [name], MD      . pantoprazole (PROTONIX) EC tablet 40 mg  40 mg Oral Daily Schnier, [name], MD   40 mg at 06/26/18 1000  . phenol (CHLORASEPTIC) mouth spray 1 spray  1 spray Mouth/Throat PRN Schnier, [name], MD      . rOPINIRole (REQUIP) tablet 0.5 mg  0.5 mg Oral QHS Schnier, [name], MD   0.5 mg at 06/25/18 2148  . sodium chloride 0.9 % bolus 500 mL  500 mL Intravenous Once Schnier, [name], MD      . vitamin C (ASCORBIC ACID) tablet 500 mg  500 mg Oral Daily Schnier, [name], MD   500 mg at 06/26/18 1000      Allergies: Allergies  Allergen Reactions  . Benazepril Nausea Only      Past Medical History: Past Medical History:  Diagnosis Date  . Arthritis   . Asthma   . Calf pain   . Cancer ([place]) 2016   left breast  .  COPD (chronic obstructive pulmonary disease) ([place])   . Diabetes ([place])   . Discoid lupus   . Gastro-esophageal reflux   . Glaucoma   . Hyperlipemia   . Hypertension   . Iron deficiency anemia   . Joint pain   . Leg swelling   . Osteoarthritis   . PVD (peripheral vascular disease) ([place])   . Sinus problem   . Sleep apnea    No CPAP/ Can't tolerate  . Stroke [facility]) 1987     Past Surgical History: Past Surgical History:  Procedure Laterality Date  . BREAST SURGERY Left    left lumpectomy  . CAROTID ANGIOGRAPHY Left 06/25/2018   Procedure: CAROTID ANGIOGRAPHY, possible intervention;  Surgeon: Katha Cabal, MD;  Location: [place] [facility];  Service: Cardiovascular;  Laterality: Left;  . CAROTID ENDARTERECTOMY Right   . CAROTID PTA/STENT INTERVENTION Left 06/25/2018   Procedure: CAROTID PTA/STENT INTERVENTION;  Surgeon: Katha Cabal, MD;  Location: [place] [facility];  Service: Cardiovascular;  Laterality: Left;  . CATARACT EXTRACTION Right 2013  .  CORONARY STENT PLACEMENT     L. L. E.  . EYE SURGERY Bilateral    cataract  . LEFT HEART CATH AND CORONARY ANGIOGRAPHY Left 05/06/2018   Procedure: LEFT HEART CATH AND CORONARY ANGIOGRAPHY;  Surgeon: Corey Skains, MD;  Location: [place] [facility];  Service: Cardiovascular;  Laterality: Left;  . LOWER EXTREMITY ANGIOGRAPHY Right 01/22/2018   Procedure: LOWER EXTREMITY ANGIOGRAPHY;  Surgeon: Katha Cabal, MD;  Location: [place] [facility];  Service: Cardiovascular;  Laterality: Right;  . RENAL ANGIOGRAPHY Left 02/26/2018   Procedure: RENAL ANGIOGRAPHY;  Surgeon: Katha Cabal, MD;  Location: [facility];  Service: Cardiovascular;  Laterality: Left;  . STENT PLACEMENT VASCULAR ([facility] HX) Left    stent placement on LLE  . TUBAL LIGATION       Family History: Family History  Problem Relation Age of Onset  . Heart disease Mother   . Diabetes Other   . Hypertension Other   . Diabetes Sister   . Lung cancer Brother   . Leukemia Daughter   . Bladder Cancer Neg Hx   . Kidney disease Neg Hx   . Prostate cancer Neg Hx      Social History: Social History   Socioeconomic History  . Marital status: Married    Spouse name: Not on file  . Number of children: Not on file  . Years of education: Not on file  . Highest education level: Not on file  Occupational History  . Not on file  Social Needs  . Financial resource strain: Not on file  . Food insecurity    Worry: Not on file    Inability: Not on file  . Transportation needs    Medical: Not on file    Non-medical: Not on file  Tobacco Use  . Smoking status: Former Smoker    Quit date: 1987    Years since quitting: 33.4  . Smokeless tobacco: Never Used  . Tobacco comment: quit 31 years  Substance and Sexual Activity  . Alcohol use: No  . Drug use: No  . Sexual activity: Not on file  Lifestyle  . Physical activity    Days per week: Not on file    Minutes per session: Not on file  . Stress: Not on  file  Relationships  . Social Herbalist on phone: Not on file    Gets together:  Not on file    Attends religious service: Not on file    Active member of club or organization: Not on file    Attends meetings of clubs or organizations: Not on file    Relationship status: Not on file  . Intimate partner violence    Fear of current or ex partner: Not on file    Emotionally abused: Not on file    Physically abused: Not on file    Forced sexual activity: Not on file  Other Topics Concern  . Not on file  Social History Narrative  . Not on file     Review of Systems: ROS  Vital Signs: Blood pressure (!) 186/46, pulse (!) 48, temperature 98.7 F (37.1 C), temperature source Oral, resp. rate (!) 26, height 5\' 7"  (1.702 m), weight 93.7 kg, SpO2 100 %.  Weight trends: Filed Weights   06/25/18 0931 06/25/18 1300  Weight: 93.9 kg 93.7 kg    Physical Exam: General: NAD,   Head: Normocephalic, atraumatic. Moist oral mucosal membranes  Eyes: Anicteric, PERRL  Neck: Supple, trachea midline  Lungs:  Clear to auscultation  Heart: Regular rate and rhythm  Abdomen:  Soft, nontender,   Extremities:  no peripheral edema.  Neurologic: Nonfocal, moving all four extremities  Skin: No lesions         Lab results: Basic Metabolic Panel: Recent Labs  Lab 06/25/18 0929 06/26/18 0233  NA  --  142  K  --  4.9  CL  --  115*  CO2  --  21*  GLUCOSE  --  182*  BUN 57* 44*  CREATININE 1.81* 1.99*  CALCIUM  --  8.7*    Liver Function Tests: No results for input(s): AST, ALT, ALKPHOS, BILITOT, PROT, ALBUMIN in the last 168 hours. No results for input(s): LIPASE, AMYLASE in the last 168 hours. No results for input(s): AMMONIA in the last 168 hours.  CBC: Recent Labs  Lab 06/26/18 0233  WBC 8.6  HGB 8.8*  HCT 28.8*  MCV 84.5  PLT 214    Cardiac Enzymes: No results for input(s): CKTOTAL, CKMB, CKMBINDEX, TROPONINI in the last 168 hours.  BNP: Invalid input(s):  POCBNP  CBG: Recent Labs  Lab 06/25/18 1334 06/25/18 1752 06/25/18 2139 06/26/18 0751 06/26/18 1116  GLUCAP 88 111* 133* 175* 191*    Microbiology: Results for orders placed or performed during the hospital encounter of 06/25/18  MRSA PCR Screening     Status: None   Collection Time: 06/25/18  2:39 PM   Specimen: Nasopharyngeal  Result Value Ref Range Status   MRSA by PCR NEGATIVE NEGATIVE Final    Comment:        The GeneXpert MRSA Assay (FDA approved for NASAL specimens only), is one component of a comprehensive MRSA colonization surveillance program. It is not intended to diagnose MRSA infection nor to guide or monitor treatment for MRSA infections. Performed at [facility], [address] [place], [place] [address]     Coagulation Studies: No results for input(s): LABPROT, INR in the last 72 hours.  Urinalysis: No results for input(s): COLORURINE, LABSPEC, PHURINE, GLUCOSEU, HGBUR, BILIRUBINUR, KETONESUR, PROTEINUR, UROBILINOGEN, NITRITE, LEUKOCYTESUR in the last 72 hours.  Invalid input(s): APPERANCEUR    Imaging: Ct Head Wo Contrast  Result Date: 06/25/2018 CLINICAL DATA:  Altered mental status this morning. Heparin given. Patient is post right femoral artery vascular access. EXAM: CT HEAD WITHOUT CONTRAST TECHNIQUE: Contiguous axial images were obtained from the base of the  skull through the vertex without intravenous contrast. COMPARISON:  03/06/2014 FINDINGS: Brain: Exam demonstrates age related atrophic change as the ventricles, cisterns and other CSF spaces are otherwise unchanged. There is mild chronic ischemic microvascular disease. Bilateral basal ganglia calcifications. Tiny old lacunar infarct over the left thalamus. No mass, mass effect, shift of midline structures or acute hemorrhage. No evidence of acute infarction. Vascular: No hyperdense vessel or unexpected calcification. Skull: Normal. Negative for fracture or focal lesion.  Sinuses/Orbits: No acute finding. Other: None. IMPRESSION: No acute findings. Mild age related atrophy and chronic ischemic microvascular disease. Small old left thalamic lacunar infarct. Electronically Signed   By: Marin Olp M.D.   On: 06/25/2018 13:31      Assessment & Plan: Ms. TANVI GATLING is a 85 y.o. black female with hypertension, CVA, obstructive sleep apnea, peripheral vascular disease, glaucoma, dementia, carotid stenosis, diabetes mellitus type II, COPD and history of breast cancer, who was admitted to [facility] on 06/25/2018 for Carotid stenosis, symptomatic w/o infarct [I65.29]   1. Acute renal failure on chronic kidney disease stage III: baseline creatinine of 1.29, GFR of 45 on 02/26/2018. Bland urine.  Chronic kidney disease is secondary to renal vascular disease and hypertension.  Acute renal failure from IV contrast exposure and perfusion injury.  - Holding telmisartan - Discontinue hydrochlorothiazide.  - Check urine studies, renal ultrasound and baseline CKD labs.   2. Hypertension: elevated. Allow for elevations for cranial perfusion. Home regimen of telmisartan, hydrochlorothiazide, hydralazine, amlodipine      LOS: 1 Makiah Clauson 6/18/20204:32 PM

## 2018-06-26 NOTE — Evaluation (Signed)
Physical Therapy Evaluation Patient Details Name: Jennifer Carrillo MRN: [id number] DOB: Jan 23, 1933 Today's Date: 06/26/2018   History of Present Illness  85 y/o female s/p vascular surgery 06/25/18.  Had Catheter placement into left common carotid artery from right femoral approach, Thoracic aortogram,  Cervical and cerebral left carotid angiogram.  Clinical Impression  Pt did relatively well with PT exam and was able to ambulate ~200 ft with heavy reliance on walker but stable vitals and only mild c/o fatigue. She reports she is not quite at her baseline but feels good about being able to go home and get back into her normal routine without further PT intervention.  Pt with some limp secondary to R knee OA/pain, but no LOBs or overt safety issues.  Will maintain on PT caseload to maintain activity and assess stair negotiation before d/c.    Follow Up Recommendations No PT follow up(will maintain on PT caseload while in hospital)    Equipment Recommendations       Recommendations for Other Services       Precautions / Restrictions Precautions Precautions: Fall Restrictions Weight Bearing Restrictions: No      Mobility  Bed Mobility Overal bed mobility: Independent             General bed mobility comments: Pt with some extra effort, but able to get to sitting EOB w/o direct assist.  Transfers Overall transfer level: Modified independent Equipment used: Rolling walker (2 wheeled)             General transfer comment: Pt was able to rise with relative ease, able to maintain balance with modest use of walker  Ambulation/Gait Ambulation/Gait assistance: Min guard Gait Distance (Feet): 200 Feet Assistive device: Rolling walker (2 wheeled)       General Gait Details: Pt with forward flexed posture/reliance on the walker along with lack of full TKE R>L with shortened, choppy cadence and occasionally veering L but able to self correct and avoid obstacles  Stairs             Wheelchair Mobility    Modified Rankin (Stroke Patients Only)       Balance Overall balance assessment: Modified Independent                                           Pertinent Vitals/Pain Pain Assessment: 0-10 Pain Score: 3  Pain Location: chronic arthritic R knee pain    Home Living Family/patient expects to be discharged to:: Private residence Living Arrangements: Spouse/significant other;Children Available Help at Discharge: Family;Available 24 hours/day   Home Access: Stairs to enter Entrance Stairs-Rails: Right Entrance Stairs-Number of Steps: 1   Home Equipment: Walker - 2 wheels;Walker - 4 wheels;Cane - single point      Prior Function Level of Independence: Independent with assistive device(s)         Comments: Pt does not drive, but reports daughter takes her into the community she is able to manage in the home w/o assist     Hand Dominance        Extremity/Trunk Assessment   Upper Extremity Assessment Upper Extremity Assessment: Overall WFL for tasks assessed    Lower Extremity Assessment Lower Extremity Assessment: Overall WFL for tasks assessed(lacks TKE b/l)       Communication   Communication: No difficulties  Cognition Arousal/Alertness: Awake/alert Behavior During Therapy: WFL for tasks assessed/performed Overall Cognitive  Status: Within Functional Limits for tasks assessed                                        General Comments      Exercises     Assessment/Plan    PT Assessment Patient needs continued PT services  PT Problem List Decreased strength;Decreased activity tolerance;Decreased mobility;Decreased balance;Decreased knowledge of use of DME;Decreased safety awareness       PT Treatment Interventions DME instruction;Gait training;Stair training;Functional mobility training;Therapeutic activities;Therapeutic exercise;Balance training;Patient/family education    PT Goals  (Current goals can be found in the Care Plan section)  Acute Rehab PT Goals Patient Stated Goal: go home PT Goal Formulation: With patient    Frequency Min 2X/week   Barriers to discharge        Co-evaluation               AM-PAC PT "6 Clicks" Mobility  Outcome Measure Help needed turning from your back to your side while in a flat bed without using bedrails?: None Help needed moving from lying on your back to sitting on the side of a flat bed without using bedrails?: None Help needed moving to and from a bed to a chair (including a wheelchair)?: None Help needed standing up from a chair using your arms (e.g., wheelchair or bedside chair)?: None Help needed to walk in hospital room?: A Little Help needed climbing 3-5 steps with a railing? : A Little 6 Click Score: 22    End of Session Equipment Utilized During Treatment: Gait belt Activity Tolerance: Patient tolerated treatment well;Patient limited by fatigue Patient left: in chair;with call bell/phone within reach Nurse Communication: Mobility status PT Visit Diagnosis: Muscle weakness (generalized) (M62.81);Difficulty in walking, not elsewhere classified (R26.2)    Time: 0383-3383 PT Time Calculation (min) (ACUTE ONLY): 37 min   Charges:   PT Evaluation $PT Eval Low Complexity: 1 Low PT Treatments $Gait Training: 8-22 mins        Kreg Shropshire, DPT 06/26/2018, 5:12 PM

## 2018-06-27 LAB — COMPREHENSIVE METABOLIC PANEL
ALT: 8 U/L (ref 0–44)
AST: 14 U/L — ABNORMAL LOW (ref 15–41)
Albumin: 2.9 g/dL — ABNORMAL LOW (ref 3.5–5.0)
Alkaline Phosphatase: 54 U/L (ref 38–126)
Anion gap: 8 (ref 5–15)
BUN: 41 mg/dL — ABNORMAL HIGH (ref 8–23)
CO2: 18 mmol/L — ABNORMAL LOW (ref 22–32)
Calcium: 8.7 mg/dL — ABNORMAL LOW (ref 8.9–10.3)
Chloride: 114 mmol/L — ABNORMAL HIGH (ref 98–111)
Creatinine, Ser: 1.88 mg/dL — ABNORMAL HIGH (ref 0.44–1.00)
GFR calc Af Amer: 28 mL/min — ABNORMAL LOW (ref >60)
GFR calc non Af Amer: 24 mL/min — ABNORMAL LOW (ref >60)
Glucose, Bld: 174 mg/dL — ABNORMAL HIGH (ref 70–99)
Potassium: 4.2 mmol/L (ref 3.5–5.1)
Sodium: 140 mmol/L (ref 135–145)
Total Bilirubin: 0.5 mg/dL (ref 0.3–1.2)
Total Protein: 6.4 g/dL — ABNORMAL LOW (ref 6.5–8.1)

## 2018-06-27 LAB — CBC
HCT: 28.1 % — ABNORMAL LOW (ref 36.0–46.0)
Hemoglobin: 8.6 g/dL — ABNORMAL LOW (ref 12.0–15.0)
MCH: 25.6 pg — ABNORMAL LOW (ref 26.0–34.0)
MCHC: 30.6 g/dL (ref 30.0–36.0)
MCV: 83.6 fL (ref 80.0–100.0)
Platelets: 199 10*3/uL (ref 150–400)
RBC: 3.36 MIL/uL — ABNORMAL LOW (ref 3.87–5.11)
RDW: 15.5 % (ref 11.5–15.5)
WBC: 7.9 10*3/uL (ref 4.0–10.5)
nRBC: 0 % (ref 0.0–0.2)

## 2018-06-27 LAB — URINALYSIS, ROUTINE W REFLEX MICROSCOPIC
Bacteria, UA: NONE SEEN
Bilirubin Urine: NEGATIVE
Glucose, UA: NEGATIVE mg/dL
Hgb urine dipstick: NEGATIVE
Ketones, ur: NEGATIVE mg/dL
Leukocytes,Ua: NEGATIVE
Nitrite: NEGATIVE
Protein, ur: 100 mg/dL — AB
Specific Gravity, Urine: 1.015 (ref 1.005–1.030)
pH: 5 (ref 5.0–8.0)

## 2018-06-27 LAB — MAGNESIUM: Magnesium: 2.2 mg/dL (ref 1.7–2.4)

## 2018-06-27 LAB — PROTEIN / CREATININE RATIO, URINE
Creatinine, Urine: 119 mg/dL
Protein Creatinine Ratio: 0.59 mg/mg{Cre} — ABNORMAL HIGH (ref 0.00–0.15)
Total Protein, Urine: 70 mg/dL

## 2018-06-27 LAB — GLUCOSE, CAPILLARY
Glucose-Capillary: 166 mg/dL — ABNORMAL HIGH (ref 70–99)
Glucose-Capillary: 176 mg/dL — ABNORMAL HIGH (ref 70–99)

## 2018-06-27 MED ORDER — SODIUM BICARBONATE 650 MG PO TABS
650.0000 mg | ORAL_TABLET | Freq: Two times a day (BID) | ORAL | Status: DC
  Administered 2018-06-27: 650 mg via ORAL
  Filled 2018-06-27: qty 1

## 2018-06-27 MED ORDER — ASPIRIN 81 MG PO TBEC: 81.0000 mg | DELAYED_RELEASE_TABLET | Freq: Every day | ORAL | 3 refills | Status: DC

## 2018-06-27 NOTE — Progress Notes (Signed)
[facility]  ROUNDING NOTE   Subjective:   Working with PT this morning.  Moved from ICU to 2A  Objective:  Vital signs in last 24 hours:  Temp:  [98.4 F (36.9 C)-99.3 F (37.4 C)] 99.1 F (37.3 C) (06/19 0751) Pulse Rate:  [55-66] 55 (06/19 0751) Resp:  [18-27] 20 (06/19 0751) BP: (152-180)/(43-62) 174/47 (06/19 0751) SpO2:  [95 %-100 %] 95 % (06/19 0751) Weight:  [91.9 kg] 91.9 kg (06/18 1952)  Weight change: -1.996 kg Filed Weights   06/25/18 0931 06/25/18 1300 06/26/18 1952  Weight: 93.9 kg 93.7 kg 91.9 kg    Intake/Output: I/O last 3 completed shifts: In: 1496.2 [P.O.:570; I.V.:826.2; IV Piggyback:100] Out: 7939 [Urine:1770]   Intake/Output this shift:  Total I/O In: 240 [P.O.:240] Out: -   Physical Exam: General: NAD,   Head: Normocephalic, atraumatic. Moist oral mucosal membranes  Eyes: Anicteric, PERRL  Neck: Supple, trachea midline  Lungs:  Clear to auscultation  Heart: Regular rate and rhythm  Abdomen:  Soft, nontender,   Extremities: no peripheral edema.  Neurologic: Nonfocal, moving all four extremities  Skin: No lesions       Basic Metabolic Panel: Recent Labs  Lab 06/25/18 0929 06/26/18 0233 06/27/18 0427  NA  --  142 140  K  --  4.9 4.2  CL  --  115* 114*  CO2  --  21* 18*  GLUCOSE  --  182* 174*  BUN 57* 44* 41*  CREATININE 1.81* 1.99* 1.88*  CALCIUM  --  8.7* 8.7*  MG  --   --  2.2    Liver Function Tests: Recent Labs  Lab 06/27/18 0427  AST 14*  ALT 8  ALKPHOS 54  BILITOT 0.5  PROT 6.4*  ALBUMIN 2.9*   No results for input(s): LIPASE, AMYLASE in the last 168 hours. No results for input(s): AMMONIA in the last 168 hours.  CBC: Recent Labs  Lab 06/26/18 0233 06/27/18 0427  WBC 8.6 7.9  HGB 8.8* 8.6*  HCT 28.8* 28.1*  MCV 84.5 83.6  PLT 214 199    Cardiac Enzymes: No results for input(s): CKTOTAL, CKMB, CKMBINDEX, TROPONINI in the last 168 hours.  BNP: Invalid input(s):  POCBNP  CBG: Recent Labs  Lab 06/26/18 1116 06/26/18 1652 06/26/18 2147 06/27/18 0817 06/27/18 1123  GLUCAP 191* 136* 154* 166* 176*    Microbiology: Results for orders placed or performed during the hospital encounter of 06/25/18  MRSA PCR Screening     Status: None   Collection Time: 06/25/18  2:39 PM   Specimen: Nasopharyngeal  Result Value Ref Range Status   MRSA by PCR NEGATIVE NEGATIVE Final    Comment:        The GeneXpert MRSA Assay (FDA approved for NASAL specimens only), is one component of a comprehensive MRSA colonization surveillance program. It is not intended to diagnose MRSA infection nor to guide or monitor treatment for MRSA infections. Performed at [facility], [address] [place], [place] [address]     Coagulation Studies: No results for input(s): LABPROT, INR in the last 72 hours.  Urinalysis: Recent Labs    06/27/18 0408  COLORURINE YELLOW*  LABSPEC 1.015  PHURINE 5.0  GLUCOSEU NEGATIVE  HGBUR NEGATIVE  BILIRUBINUR NEGATIVE  KETONESUR NEGATIVE  PROTEINUR 100*  NITRITE NEGATIVE  LEUKOCYTESUR NEGATIVE      Imaging: No results found.   Medications:   . sodium chloride     . amLODipine  10 mg Oral Daily  .  anastrozole  1 mg Oral QPM  . aspirin EC  81 mg Oral Daily  . atorvastatin  10 mg Oral Q supper  . calcium carbonate  600 mg of elemental calcium Oral 4 times weekly  . cephALEXin  500 mg Oral TID  . cholecalciferol  400 Units Oral 3 times weekly  . clopidogrel  75 mg Oral Daily  . digoxin  0.125 mg Oral Daily  . docusate sodium  100 mg Oral Daily  . donepezil  10 mg Oral QHS  . ferrous sulfate  325 mg Oral 4 times weekly  . hydrALAZINE  25 mg Oral BID  . insulin aspart  0-20 Units Subcutaneous TID WC  . insulin aspart  0-5 Units Subcutaneous QHS  . isosorbide mononitrate  30 mg Oral Daily  . magnesium oxide  400 mg Oral Daily  . montelukast  10 mg Oral QHS  . pantoprazole  40 mg Oral Daily  .  rOPINIRole  0.5 mg Oral QHS  . sodium bicarbonate  650 mg Oral BID  . vitamin C  500 mg Oral Daily   acetaminophen **OR** acetaminophen, albuterol, alum & mag hydroxide-simeth, docusate sodium, guaiFENesin-dextromethorphan, hydrALAZINE, labetalol, metoprolol tartrate, ondansetron, phenol  Assessment/ Plan:  Jennifer Carrillo is a 85 y.o. black female with hypertension, CVA, obstructive sleep apnea, peripheral vascular disease, glaucoma, dementia, carotid stenosis, diabetes mellitus type II, COPD and history of breast cancer, who was admitted to [facility] on 06/25/2018 for Carotid stenosis, status post left carotid artery stenting   1. Acute renal failure on chronic kidney disease stage III: baseline creatinine of 1.29, GFR of 45 on 02/26/2018. Bland urine.  Chronic kidney disease is secondary to renal vascular disease and hypertension.  Acute renal failure from IV contrast exposure and perfusion injury.  - Holding telmisartan - Discontinue hydrochlorothiazide.  - CKD Labs reviewed with patient.   2. Hypertension: elevated. Allow for elevations for cranial perfusion. Home regimen of telmisartan, hydrochlorothiazide, hydralazine, amlodipine   LOS: 2 Emeline Simpson 6/19/20201:48 PM

## 2018-06-27 NOTE — Plan of Care (Signed)
  Problem: Clinical Measurements: Goal: Ability to maintain clinical measurements within normal limits will improve Outcome: Progressing Goal: Diagnostic test results will improve Outcome: Progressing   Problem: Activity: Goal: Risk for activity intolerance will decrease Outcome: Progressing   Problem: Elimination: Goal: Will not experience complications related to urinary retention Outcome: Progressing   Problem: Pain Managment: Goal: General experience of comfort will improve Outcome: Progressing   Problem: Safety: Goal: Ability to remain free from injury will improve Outcome: Progressing

## 2018-06-27 NOTE — Evaluation (Signed)
Occupational Therapy Evaluation Patient Details Name: Jennifer Carrillo MRN: [id number] DOB: [birth year] Today's Date: 06/27/2018    History of Present Illness 85 y/o female s/p vascular surgery 06/25/18. Had Catheter placement into left common carotid artery from right femoral approach, Thoracic aortogram, Cervical and cerebral left carotid angiogram.   Clinical Impression   Pt seen for OT evaluation. Pt polite, agreeable to therapy. Pt reports difficulty with shower transfers but family assists with this. Family also assists with transportation, as pt does not drive. Pt eager to return home and take a nap "in my own bed." Pt has 24/7 sup/assist from family. Pt performed bed mobility without issue, denies dizziness. Pt able to doff/don socks and perform transfers without difficulty. Pt uses AD at baseline for mobility. No neuro deficits appreciated upon assessment. Pt does report discomfort in RUE near shoulder but is not functionally limited. Pt near baseline. Do not anticipate need for further skilled OT. Will sign off. Please re-consult if additional needs arise.     Follow Up Recommendations  No OT follow up    Equipment Recommendations  None recommended by OT    Recommendations for Other Services       Precautions / Restrictions Precautions Precautions: Fall Restrictions Weight Bearing Restrictions: No      Mobility Bed Mobility Overal bed mobility: Independent                Transfers Overall transfer level: Modified independent Equipment used: Rolling walker (2 wheeled)                  Balance Overall balance assessment: Modified Independent                                         ADL either performed or assessed with clinical judgement   ADL Overall ADL's : Needs assistance/impaired                                       General ADL Comments: supervision level for ADL and HH assist without AD or CGA for functional  mobility with RW     Vision Baseline Vision/History: Wears glasses Wears Glasses: Reading only Patient Visual Report: No change from baseline       Perception     Praxis      Pertinent Vitals/Pain Pain Assessment: No/denies pain     Hand Dominance     Extremity/Trunk Assessment Upper Extremity Assessment Upper Extremity Assessment: Overall WFL for tasks assessed;RUE deficits/detail RUE Deficits / Details: R shoulder sore/weakness 4-/5, all else 5/5 bilat RUE Sensation: WNL RUE Coordination: WNL   Lower Extremity Assessment Lower Extremity Assessment: Overall WFL for tasks assessed(lacks TKE b/l)       Communication Communication Communication: No difficulties   Cognition Arousal/Alertness: Awake/alert Behavior During Therapy: WFL for tasks assessed/performed Overall Cognitive Status: Within Functional Limits for tasks assessed                                     General Comments       Exercises Other Exercises Other Exercises: pt educated in falls prevention, using shower chair at home for seated shower   Shoulder Instructions      Home Living Family/patient expects to  be discharged to:: Private residence Living Arrangements: Spouse/significant other;Children Available Help at Discharge: Family;Available 24 hours/day Type of Home: House Home Access: Stairs to enter [employer] of Steps: 1 Entrance Stairs-Rails: Right Home Layout: One level     Bathroom Shower/Tub: Walk-in shower         Home Equipment: Environmental consultant - 2 wheels;Walker - 4 wheels;Cane - single point;Shower seat          Prior Functioning/Environment Level of Independence: Independent with assistive device(s)        Comments: Pt does not drive, but reports daughter takes her into the community she is able to manage in the home w/o assist.        OT Problem List: Decreased activity tolerance;Pain      OT Treatment/Interventions:      OT Goals(Current  goals can be found in the care plan section) Acute Rehab OT Goals Patient Stated Goal: go home OT Goal Formulation: All assessment and education complete, DC therapy  OT Frequency:     Barriers to D/C:            Co-evaluation              AM-PAC OT "6 Clicks" Daily Activity     Outcome Measure Help from another person eating meals?: None Help from another person taking care of personal grooming?: None Help from another person toileting, which includes using toliet, bedpan, or urinal?: A Little Help from another person bathing (including washing, rinsing, drying)?: A Little Help from another person to put on and taking off regular upper body clothing?: None Help from another person to put on and taking off regular lower body clothing?: A Little 6 Click Score: 21   End of Session    Activity Tolerance: Patient tolerated treatment well Patient left: in bed;with call bell/phone within reach;with bed alarm set  OT Visit Diagnosis: Other abnormalities of gait and mobility (R26.89)                Time: 5374-8270 OT Time Calculation (min): 9 min Charges:  OT General Charges $OT Visit: 1 Visit OT Evaluation $OT Eval Low Complexity: 1 Low  [name], MPH, MS, OTR/L ascom [phone] 06/27/18, 10:54 AM

## 2018-06-27 NOTE — Discharge Instructions (Signed)
Vascular Surgery Discharge Instructions 1) You may shower. Keep groins clean and dry.  2) Tylenol for pain. 3) No driving until you follow up in our office.

## 2018-06-27 NOTE — Care Management Important Message (Signed)
Important Message  Patient Details  Name: Jennifer Carrillo MRN: [id number] Date of Birth: 20-Dec-1933   Medicare Important Message Given:  Yes  Initial Medicare IM given by Patient Access Associate on 06/26/2018 at 1027.  Still valid.   Dannette Barbara 06/27/2018, 9:21 AM

## 2018-06-27 NOTE — Progress Notes (Signed)
Discharge instructions explained to pt/ verbalized an understanding/ iv and tele removed/ R groin site WNL/ will transport off unit when ride arrives

## 2018-06-27 NOTE — Discharge Summary (Signed)
[facility]    Discharge Summary  Patient ID:  ALEANA FIFITA MRN: [id number] DOB/AGE: 1933-12-25 85 y.o.  Admit date: 06/25/2018 Discharge date: 06/27/2018 Date of Surgery: 06/25/2018 Surgeon: Surgeon(s): Schnier, [name], MD  Admission Diagnosis: Carotid stenosis, symptomatic w/o infarct [I65.29]  Discharge Diagnoses:  Carotid stenosis, symptomatic w/o infarct [I65.29]  Secondary Diagnoses: Past Medical History:  Diagnosis Date  . Arthritis   . Asthma   . Calf pain   . Cancer [facility]) 2016   left breast  . COPD (chronic obstructive pulmonary disease) ([place])   . Diabetes ([place])   . Discoid lupus   . Gastro-esophageal reflux   . Glaucoma   . Hyperlipemia   . Hypertension   . Iron deficiency anemia   . Joint pain   . Leg swelling   . Osteoarthritis   . PVD (peripheral vascular disease) ([place])   . Sinus problem   . Sleep apnea    No CPAP/ Can't tolerate  . Stroke ([place]) 1987   Procedure(s): LEFT CAROTID STENT PLACEMENT  Discharged Condition: Good  HPI / Hospital Course:  The patient is a 85 year old female with multiple medical issues (see above) who presents with critical greater than 90% stenosis of the left internal carotid artery and visual changes.  On June 25, 2018 and the patient underwent: 1.  Ultrasound guidance for vascular access right femoral artery 2.  Placement of a 10 x 8 exact stent with the use of the NAV-6 embolic protection device in the left carotid artery  The patient tolerated procedure and was transferred to the recovery room where she started to experience aphasia and right-sided weakness were noted by nursing staff.  The patient was emergently taken back to the endovascular suite and underwent: 1.   Ultrasound Guidance for vascular access right femoral artery 2.   Catheter placement into left common carotid artery from right femoral approach 3.   Thoracic aortogram 4.   Cervical and cerebral left carotid  angiogram 5.   StarClose closure device right femoral artery  Findings: This showed arch is unchanged type I widely patent ostia of the great vessels.  I then selected a H1 catheter and cannulated the left common carotid artery. Cervical and cerebral carotid angiography were then performed on the left side. The intracranial flow was found to be unchanged no obvious filling defects identified. The cervical carotid artery was widely patent with a stent in good position no abnormalities identified.   06/25/18 / CTA Head: 1) No acute findings. 2) Mild age related atrophy and chronic ischemic microvascular disease. Small old left thalamic lacunar infarct  The patient's night of surgery was unremarkable.  Postop day #1 the patient's diet was advanced, her Foley was removed, her pain was controlled to the use of p.o. pain medication and she was ambulating independently.  The patient was discharged home on postop day #2 with stable vital signs and afebrile.  Physical exam was unremarkable.  Extubated: POD # 0  Physical exam:  A&Ox3, NAD Face: Symmetrical.  Tongue is midline. Neck: Trachea midline.  No swelling or ecchymosis noted. CV: RRR Pulmonary: CTA Bilaterally Abdomen: Soft, Nontender, Nondistended Right Groin: Access site is clean dry and intact.  No swelling or ecchymosis noted. Vascular: Warm, nontender, minimal edema Neuro: Intact.  Upper/lower, right/left 5 out of 5. Intact.  Post-op wounds clean, dry, intact or healing well  Pt. Ambulating, voiding and taking PO diet without difficulty.  Pt pain controlled with PO pain meds.  Labs as below  Complications:none  Consults:  Nephrology  Significant Diagnostic Studies: CBC Lab Results  Component Value Date   WBC 7.9 06/27/2018   HGB 8.6 (L) 06/27/2018   HCT 28.1 (L) 06/27/2018   MCV 83.6 06/27/2018   PLT 199 06/27/2018   BMET    Component Value Date/Time   NA 140 06/27/2018 0427   NA 141 12/17/2017 1032   NA 142  05/20/2013 0509   K 4.2 06/27/2018 0427   K 3.7 05/20/2013 0509   CL 114 (H) 06/27/2018 0427   CL 108 (H) 05/20/2013 0509   CO2 18 (L) 06/27/2018 0427   CO2 30 05/20/2013 0509   GLUCOSE 174 (H) 06/27/2018 0427   GLUCOSE 108 (H) 05/20/2013 0509   BUN 41 (H) 06/27/2018 0427   BUN 31 (H) 12/17/2017 1032   BUN 13 05/20/2013 0509   CREATININE 1.88 (H) 06/27/2018 0427   CREATININE 0.92 05/20/2013 0509   CALCIUM 8.7 (L) 06/27/2018 0427   CALCIUM 8.9 05/20/2013 0509   GFRNONAA 24 (L) 06/27/2018 0427   GFRNONAA >60 05/20/2013 0509   GFRAA 28 (L) 06/27/2018 0427   GFRAA >60 05/20/2013 0509   COAG No results found for: INR, PROTIME  Disposition:  Discharge to :Home  Allergies as of 06/27/2018      Reactions   Benazepril Nausea Only      Medication List    STOP taking these medications   cephALEXin 500 MG capsule Commonly known as: Oasis these medications   Accu-Chek FastClix Lancets Misc by Does not apply route 3 (three) times daily after meals.   Accu-Chek SmartView test strip Generic drug: glucose blood 3 each by Other route 3 (three) times daily after meals. Use as instructed   acetaminophen 500 MG tablet Commonly known as: TYLENOL Take 1,000 mg by mouth every 6 (six) hours as needed for moderate pain or headache.   amLODipine 10 MG tablet Commonly known as: NORVASC Take 1 tablet (10 mg total) by mouth daily.   anastrozole 1 MG tablet Commonly known as: ARIMIDEX Take 1 mg by mouth every evening.   aspirin 81 MG EC tablet Take 1 tablet (81 mg total) by mouth daily. Start taking on: June 28, 2018   atorvastatin 10 MG tablet Commonly known as: Lipitor Take 1 tablet (10 mg total) by mouth daily with supper. What changed: when to take this   calcium carbonate 1500 (600 Ca) MG Tabs tablet Commonly known as: OSCAL Take 600 mg by mouth 4 (four) times a week.   clopidogrel 75 MG tablet Commonly known as: PLAVIX TAKE 1 TABLET BY MOUTH DAILY What  changed: when to take this   CRANBERRY PO Take 4,200 mg by mouth 4 (four) times a week.   digoxin 0.125 MG tablet Commonly known as: LANOXIN Take 1 tablet (0.125 mg total) by mouth daily.   docusate sodium 100 MG capsule Commonly known as: COLACE Take 200 mg by mouth at bedtime as needed for mild constipation.   donepezil 10 MG tablet Commonly known as: ARICEPT Take 1 tablet (10 mg total) by mouth at bedtime. Take one tab po qhs for memory   ferrous sulfate 325 (65 FE) MG EC tablet Take 325 mg by mouth 4 (four) times a week.   hydrALAZINE 25 MG tablet Commonly known as: APRESOLINE Take 1 tablet (25 mg total) by mouth 2 (two) times daily.   hydrochlorothiazide 12.5 MG tablet Commonly known as: HYDRODIURIL Take 12.5 mg by  mouth daily.   ibandronate 150 MG tablet Commonly known as: BONIVA Take 150 mg by mouth every 30 (thirty) days. Take in the morning with a full glass of water, on an empty stomach, and do not take anything else by mouth or lie down for the next 30 min.   insulin NPH-regular Human (70-30) 100 UNIT/ML injection Inject 30 Units into the skin 2 (two) times daily.   isosorbide mononitrate 30 MG 24 hr tablet Commonly known as: IMDUR Take 30 mg by mouth daily.   lidocaine 2 % solution Commonly known as: XYLOCAINE Apply very small amount to affected area QID prn pain   magnesium oxide 400 (241.3 Mg) MG tablet Commonly known as: MAG-OX Take 1 tablet (400 mg total) by mouth daily. What changed: when to take this   montelukast 10 MG tablet Commonly known as: SINGULAIR Take 1 tablet (10 mg total) by mouth at bedtime. What changed:   when to take this  reasons to take this   pantoprazole 40 MG tablet Commonly known as: PROTONIX Take 1 tablet (40 mg total) by mouth daily. What changed: when to take this   ProAir HFA 108 (90 Base) MCG/ACT inhaler Generic drug: albuterol Inhale 2 puffs into the lungs every 6 (six) hours as needed for wheezing or  shortness of breath.   rOPINIRole 0.5 MG tablet Commonly known as: REQUIP Take 1 tablet (0.5 mg total) by mouth at bedtime.   telmisartan 80 MG tablet Commonly known as: MICARDIS Take 1 tablet (80 mg total) by mouth daily. What changed: when to take this   vitamin C 500 MG tablet Commonly known as: ASCORBIC ACID Take by mouth daily.   Vitamin D-400 10 MCG (400 UNIT) Tabs tablet Generic drug: cholecalciferol Take 400 Units by mouth 3 (three) times a week.      Verbal and written Discharge instructions given to the patient. Wound care per Discharge AVS Follow-up Information    Schnier, [name], MD In 1 week.   Specialties: Vascular Surgery, Cardiology, Radiology, Vascular Surgery Why: Can see Arna Medici. First post-op check.; Office will call back w/ Appt Contact information: [place] [address] [phone]        Lavonia Dana, MD In 2 weeks.   Specialty: Internal Medicine Why: Seen as consult in hospital. Continued follow up. ; Office will call back w/ Appt Contact information: [address] [place] [address] [phone]          Signed: Sela Hua, PA-C  06/27/2018, 4:18 PM

## 2018-06-27 NOTE — Progress Notes (Signed)
OT Cancellation Note  Patient Details Name: Jennifer Carrillo MRN: [id number] DOB: Sep 25, 1933   Cancelled Treatment:    Reason Eval/Treat Not Completed: Other (comment). Consult received, chart reviewed. Spoke with RN who cleared pt to work with therapy, however, pt just started to eat breakfast. Will re-attempt OT evaluation at later time.   [name], MPH, MS, OTR/L ascom [phone] 06/27/18, 8:56 AM

## 2018-06-28 LAB — PARATHYROID HORMONE, INTACT (NO CA): PTH: 35 pg/mL (ref 15–65)

## 2018-06-30 ENCOUNTER — Other Ambulatory Visit: Payer: Self-pay

## 2018-06-30 LAB — PROTEIN ELECTROPHORESIS, SERUM
A/G Ratio: 0.9 (ref 0.7–1.7)
Albumin ELP: 2.8 g/dL — ABNORMAL LOW (ref 2.9–4.4)
Alpha-1-Globulin: 0.3 g/dL (ref 0.0–0.4)
Alpha-2-Globulin: 0.9 g/dL (ref 0.4–1.0)
Beta Globulin: 1 g/dL (ref 0.7–1.3)
Gamma Globulin: 0.9 g/dL (ref 0.4–1.8)
Globulin, Total: 3 g/dL (ref 2.2–3.9)
Total Protein ELP: 5.8 g/dL — ABNORMAL LOW (ref 6.0–8.5)

## 2018-06-30 LAB — PROTEIN ELECTRO, RANDOM URINE
Albumin ELP, Urine: 80.4 %
Alpha-1-Globulin, U: 1.2 %
Alpha-2-Globulin, U: 3.8 %
Beta Globulin, U: 7 %
Gamma Globulin, U: 7.6 %
Total Protein, Urine: 73.4 mg/dL

## 2018-06-30 NOTE — Patient Outreach (Signed)
[facility]) Care Management  06/30/2018  Jennifer Carrillo 28-Jan-1933 [id number]   EMMI- General Discharge RED ON EMMI ALERT Day # 1 Date: 06/29/2018 Red Alert Reason:  Read discharge papers? No  Know who to call about changes in condition? No    Outreach attempt: spoke with patient. She is able to verify HIPAA.  Addressed red alerts.  Patient states that her daughter read her discharge instructions to her.  She denies any questions or problems with discharge instruction. She states that she know to get in contact with her doctor for any problems.  Patient states she is trying to get over a cold.  She states she has not taken any thing but just drinking hot tea.  Advised patient that if she starts running a fever or feeling worse to contact her physician. She verbalized understanding and denies any further problems.     Plan: RN CM will close case.   Jone Baseman, RN, MSN [facility] Care Management Care Management Coordinator Direct Line [phone] Toll Free: [phone]  Fax: [phone]

## 2018-07-03 ENCOUNTER — Other Ambulatory Visit: Payer: Self-pay

## 2018-07-03 NOTE — Patient Outreach (Signed)
[facility]) Care Management  07/03/2018  Jennifer Carrillo [birth year] [id number]   EMMI- General Discharge RED ON EMMI ALERT Day # 4 Date: 07/02/2018 Red Alert Reason: Sad/hopeless, anxious/empty? yes  Outreach attempt: Spoke with patient.  She is able to verify HIPAA.  Discussed automated phone call. Patient states that she gets down sometimes with not being as active as she was but denies depression.  She states she lives with spouse, son, and her daughter comes by often.  She states that she sees the doctor on tomorrow for a follow up and her daughter will take her. Discussed depression with patient and that if she feels she needs assistance she can discuss with her physician. She verbalized understanding.  Advised patient that CM would send letter and brochure.  She is agreeable.     Plan: RN CM will close case.    Jone Baseman, RN, MSN [facility] Care Management Care Management Coordinator Direct Line [phone] Toll Free: [phone]  Fax: [phone]

## 2018-07-04 ENCOUNTER — Other Ambulatory Visit: Payer: Self-pay

## 2018-07-04 ENCOUNTER — Encounter (INDEPENDENT_AMBULATORY_CARE_PROVIDER_SITE_OTHER): Payer: Self-pay | Admitting: Vascular Surgery

## 2018-07-04 ENCOUNTER — Ambulatory Visit (INDEPENDENT_AMBULATORY_CARE_PROVIDER_SITE_OTHER): Payer: Medicare Other | Admitting: Vascular Surgery

## 2018-07-04 VITALS — BP 181/48 | HR 66 | Resp 16 | Wt 198.4 lb

## 2018-07-04 DIAGNOSIS — Z9889 Other specified postprocedural states: Secondary | ICD-10-CM

## 2018-07-04 DIAGNOSIS — I6522 Occlusion and stenosis of left carotid artery: Secondary | ICD-10-CM

## 2018-07-04 DIAGNOSIS — I10 Essential (primary) hypertension: Secondary | ICD-10-CM

## 2018-07-04 DIAGNOSIS — Z79899 Other long term (current) drug therapy: Secondary | ICD-10-CM

## 2018-07-04 NOTE — Progress Notes (Signed)
Patient ID: Jennifer Carrillo, female   DOB: [birth year], 85 y.o.   MRN: [id number]  Chief Complaint  Patient presents with  . Follow-up    [facility] 1week post op    HPI Jennifer Carrillo is a 85 y.o. female.  Patient returns today a week after carotid stent placement.  With the procedure and the perioperative lying flat, she has sciatic nerve irritation on the right side.  This affects her back, hip and buttocks, and goes down her leg.  She is otherwise okay.  No focal neurologic deficits.  Access site is well-healed.   Past Medical History:  Diagnosis Date  . Arthritis   . Asthma   . Calf pain   . Cancer [facility]) 2016   left breast  . COPD (chronic obstructive pulmonary disease) ([place])   . Diabetes ([place])   . Discoid lupus   . Gastro-esophageal reflux   . Glaucoma   . Hyperlipemia   . Hypertension   . Iron deficiency anemia   . Joint pain   . Leg swelling   . Osteoarthritis   . PVD (peripheral vascular disease) ([place])   . Sinus problem   . Sleep apnea    No CPAP/ Can't tolerate  . Stroke [facility]) 1987    Past Surgical History:  Procedure Laterality Date  . BREAST SURGERY Left    left lumpectomy  . CAROTID ANGIOGRAPHY Left 06/25/2018   Procedure: CAROTID ANGIOGRAPHY, possible intervention;  Surgeon: Katha Cabal, MD;  Location: [place] CV LAB;  Service: Cardiovascular;  Laterality: Left;  . CAROTID ENDARTERECTOMY Right   . CAROTID PTA/STENT INTERVENTION Left 06/25/2018   Procedure: CAROTID PTA/STENT INTERVENTION;  Surgeon: Katha Cabal, MD;  Location: [place] CV LAB;  Service: Cardiovascular;  Laterality: Left;  . CATARACT EXTRACTION Right 2013  . CORONARY STENT PLACEMENT     L. L. E.  . EYE SURGERY Bilateral    cataract  . LEFT HEART CATH AND CORONARY ANGIOGRAPHY Left 05/06/2018   Procedure: LEFT HEART CATH AND CORONARY ANGIOGRAPHY;  Surgeon: Corey Skains, MD;  Location: [place] CV LAB;  Service: Cardiovascular;  Laterality: Left;  . LOWER  EXTREMITY ANGIOGRAPHY Right 01/22/2018   Procedure: LOWER EXTREMITY ANGIOGRAPHY;  Surgeon: Katha Cabal, MD;  Location: [place] CV LAB;  Service: Cardiovascular;  Laterality: Right;  . RENAL ANGIOGRAPHY Left 02/26/2018   Procedure: RENAL ANGIOGRAPHY;  Surgeon: Katha Cabal, MD;  Location: [place] CV LAB;  Service: Cardiovascular;  Laterality: Left;  . STENT PLACEMENT VASCULAR ([facility] HX) Left    stent placement on LLE  . TUBAL LIGATION        Allergies  Allergen Reactions  . Benazepril Nausea Only    Current Outpatient Medications  Medication Sig Dispense Refill  . ACCU-CHEK FASTCLIX LANCETS MISC by Does not apply route 3 (three) times daily after meals.    Marland Kitchen acetaminophen (TYLENOL) 500 MG tablet Take 1,000 mg by mouth every 6 (six) hours as needed for moderate pain or headache.    Marland Kitchen amLODipine (NORVASC) 10 MG tablet Take 1 tablet (10 mg total) by mouth daily. 90 tablet 3  . anastrozole (ARIMIDEX) 1 MG tablet Take 1 mg by mouth every evening.     Marland Kitchen aspirin EC 81 MG EC tablet Take 1 tablet (81 mg total) by mouth daily. 90 tablet 3  . atorvastatin (LIPITOR) 10 MG tablet Take 1 tablet (10 mg total) by mouth daily with supper. (Patient taking differently: Take 10  mg by mouth daily at 3 pm. ) 90 tablet 3  . calcium carbonate (OSCAL) 1500 (600 Ca) MG TABS tablet Take 600 mg by mouth 4 (four) times a week.     . cholecalciferol (VITAMIN D-400) 10 MCG (400 UNIT) TABS tablet Take 400 Units by mouth 3 (three) times a week.    . clopidogrel (PLAVIX) 75 MG tablet TAKE 1 TABLET BY MOUTH DAILY (Patient taking differently: Take 75 mg by mouth daily with supper. ) 90 tablet 1  . CRANBERRY PO Take 4,200 mg by mouth 4 (four) times a week.    . digoxin (LANOXIN) 0.125 MG tablet Take 1 tablet (0.125 mg total) by mouth daily. 90 tablet 3  . docusate sodium (COLACE) 100 MG capsule Take 200 mg by mouth at bedtime as needed for mild constipation.     Marland Kitchen donepezil (ARICEPT) 10 MG tablet Take  1 tablet (10 mg total) by mouth at bedtime. Take one tab po qhs for memory 90 tablet 2  . ferrous sulfate 325 (65 FE) MG EC tablet Take 325 mg by mouth 4 (four) times a week.     Marland Kitchen glucose blood (ACCU-CHEK SMARTVIEW) test strip 3 each by Other route 3 (three) times daily after meals. Use as instructed    . hydrALAZINE (APRESOLINE) 25 MG tablet Take 1 tablet (25 mg total) by mouth 2 (two) times daily. 180 tablet 1  . hydrochlorothiazide (HYDRODIURIL) 12.5 MG tablet Take 12.5 mg by mouth daily.    Marland Kitchen ibandronate (BONIVA) 150 MG tablet Take 150 mg by mouth every 30 (thirty) days. Take in the morning with a full glass of water, on an empty stomach, and do not take anything else by mouth or lie down for the next 30 min.    . insulin NPH-regular Human (70-30) 100 UNIT/ML injection Inject 30 Units into the skin 2 (two) times daily.    . isosorbide mononitrate (IMDUR) 30 MG 24 hr tablet Take 30 mg by mouth daily.     . magnesium oxide (MAG-OX) 400 (241.3 Mg) MG tablet Take 1 tablet (400 mg total) by mouth daily. (Patient taking differently: Take 400 mg by mouth daily at 3 pm. ) 90 tablet 3  . montelukast (SINGULAIR) 10 MG tablet Take 1 tablet (10 mg total) by mouth at bedtime. (Patient taking differently: Take 10 mg by mouth daily as needed (allergies.). ) 90 tablet 3  . pantoprazole (PROTONIX) 40 MG tablet Take 1 tablet (40 mg total) by mouth daily. (Patient taking differently: Take 40 mg by mouth daily before breakfast. ) 90 tablet 1  . PROAIR HFA 108 (90 Base) MCG/ACT inhaler Inhale 2 puffs into the lungs every 6 (six) hours as needed for wheezing or shortness of breath.     Marland Kitchen rOPINIRole (REQUIP) 0.5 MG tablet Take 1 tablet (0.5 mg total) by mouth at bedtime. 90 tablet 2  . telmisartan (MICARDIS) 80 MG tablet Take 1 tablet (80 mg total) by mouth daily. (Patient taking differently: Take 80 mg by mouth every evening. ) 90 tablet 3  . vitamin C (ASCORBIC ACID) 500 MG tablet Take by mouth daily.     Marland Kitchen lidocaine  (XYLOCAINE) 2 % solution Apply very small amount to affected area QID prn pain (Patient not taking: Reported on 06/20/2018) 15 mL 0   No current facility-administered medications for this visit.         Physical Exam BP (!) 181/48 (BP Location: Right Arm)   Pulse 66   Resp 16  Wt 198 lb 6.4 oz (90 kg)   BMI 31.07 kg/m  Gen:  WD/WN, NAD Skin: incision C/D/I Neuro: no focal neurologic deficits appreciated     Assessment/Plan:  Benign essential HTN blood pressure control important in reducing the progression of atherosclerotic disease. On appropriate oral medications.   Carotid stenosis, symptomatic w/o infarct 1 week status post left carotid stent placement with no focal neurologic deficits and doing well.  Continue current medical regimen.  Recheck with duplex in 3 months.      Leotis Pain 07/04/2018, 10:30 AM   This note was created with Dragon medical transcription system.  Any errors from dictation are unintentional.

## 2018-07-04 NOTE — Assessment & Plan Note (Signed)
1 week status post left carotid stent placement with no focal neurologic deficits and doing well.  Continue current medical regimen.  Recheck with duplex in 3 months.

## 2018-07-04 NOTE — Assessment & Plan Note (Signed)
blood pressure control important in reducing the progression of atherosclerotic disease. On appropriate oral medications.  

## 2018-07-18 ENCOUNTER — Telehealth: Payer: Self-pay

## 2018-07-18 NOTE — Telephone Encounter (Signed)
Pt called that she was sweating and light headache advised pt check her glucose was 142 and alsoadvised her check her bp she don't have a batter y her husband going to get batteries call back again in few minutes she was feeling fine advised her any chest pain,bp high  She need go  To ED and also call back Monday and make appt need to been seen

## 2018-07-18 NOTE — Telephone Encounter (Signed)
Ok. Thank you.

## 2018-07-24 DIAGNOSIS — N183 Chronic kidney disease, stage 3 (moderate): Secondary | ICD-10-CM | POA: Diagnosis not present

## 2018-07-24 DIAGNOSIS — D631 Anemia in chronic kidney disease: Secondary | ICD-10-CM | POA: Diagnosis not present

## 2018-07-24 DIAGNOSIS — N179 Acute kidney failure, unspecified: Secondary | ICD-10-CM | POA: Diagnosis not present

## 2018-07-24 DIAGNOSIS — I129 Hypertensive chronic kidney disease with stage 1 through stage 4 chronic kidney disease, or unspecified chronic kidney disease: Secondary | ICD-10-CM | POA: Diagnosis not present

## 2018-07-29 ENCOUNTER — Other Ambulatory Visit: Payer: Self-pay

## 2018-07-29 DIAGNOSIS — I10 Essential (primary) hypertension: Secondary | ICD-10-CM

## 2018-07-29 MED ORDER — HYDRALAZINE HCL 25 MG PO TABS: 25.0000 mg | ORAL_TABLET | Freq: Two times a day (BID) | ORAL | 1 refills | Status: DC

## 2018-08-01 DIAGNOSIS — C50512 Malignant neoplasm of lower-outer quadrant of left female breast: Secondary | ICD-10-CM | POA: Diagnosis not present

## 2018-08-01 DIAGNOSIS — Z170 Estrogen receptor positive status [ER+]: Secondary | ICD-10-CM | POA: Diagnosis not present

## 2018-08-01 DIAGNOSIS — D649 Anemia, unspecified: Secondary | ICD-10-CM | POA: Diagnosis not present

## 2018-08-01 DIAGNOSIS — M818 Other osteoporosis without current pathological fracture: Secondary | ICD-10-CM | POA: Diagnosis not present

## 2018-08-06 DIAGNOSIS — D649 Anemia, unspecified: Secondary | ICD-10-CM | POA: Diagnosis not present

## 2018-08-18 ENCOUNTER — Other Ambulatory Visit: Payer: Self-pay | Admitting: Adult Health

## 2018-09-17 ENCOUNTER — Ambulatory Visit: Payer: Medicare Other | Admitting: Adult Health

## 2018-09-22 DIAGNOSIS — I2510 Atherosclerotic heart disease of native coronary artery without angina pectoris: Secondary | ICD-10-CM | POA: Diagnosis not present

## 2018-09-22 DIAGNOSIS — R0602 Shortness of breath: Secondary | ICD-10-CM | POA: Diagnosis not present

## 2018-09-22 DIAGNOSIS — I10 Essential (primary) hypertension: Secondary | ICD-10-CM | POA: Diagnosis not present

## 2018-09-22 DIAGNOSIS — I70219 Atherosclerosis of native arteries of extremities with intermittent claudication, unspecified extremity: Secondary | ICD-10-CM | POA: Diagnosis not present

## 2018-09-22 DIAGNOSIS — R9431 Abnormal electrocardiogram [ECG] [EKG]: Secondary | ICD-10-CM | POA: Diagnosis not present

## 2018-10-04 DIAGNOSIS — Z1159 Encounter for screening for other viral diseases: Secondary | ICD-10-CM | POA: Diagnosis not present

## 2018-10-04 DIAGNOSIS — Z01812 Encounter for preprocedural laboratory examination: Secondary | ICD-10-CM | POA: Diagnosis not present

## 2018-10-06 ENCOUNTER — Ambulatory Visit (INDEPENDENT_AMBULATORY_CARE_PROVIDER_SITE_OTHER): Payer: Medicare Other | Admitting: Vascular Surgery

## 2018-10-06 ENCOUNTER — Encounter (INDEPENDENT_AMBULATORY_CARE_PROVIDER_SITE_OTHER): Payer: Medicare Other

## 2018-10-06 ENCOUNTER — Ambulatory Visit: Payer: Self-pay | Admitting: Adult Health

## 2018-10-07 DIAGNOSIS — K5730 Diverticulosis of large intestine without perforation or abscess without bleeding: Secondary | ICD-10-CM | POA: Diagnosis not present

## 2018-10-07 DIAGNOSIS — D126 Benign neoplasm of colon, unspecified: Secondary | ICD-10-CM | POA: Diagnosis not present

## 2018-10-07 DIAGNOSIS — Z8673 Personal history of transient ischemic attack (TIA), and cerebral infarction without residual deficits: Secondary | ICD-10-CM | POA: Diagnosis not present

## 2018-10-07 DIAGNOSIS — R195 Other fecal abnormalities: Secondary | ICD-10-CM | POA: Diagnosis not present

## 2018-10-07 DIAGNOSIS — E119 Type 2 diabetes mellitus without complications: Secondary | ICD-10-CM | POA: Diagnosis not present

## 2018-10-07 DIAGNOSIS — K219 Gastro-esophageal reflux disease without esophagitis: Secondary | ICD-10-CM | POA: Diagnosis not present

## 2018-10-07 DIAGNOSIS — Z9012 Acquired absence of left breast and nipple: Secondary | ICD-10-CM | POA: Diagnosis not present

## 2018-10-07 DIAGNOSIS — K635 Polyp of colon: Secondary | ICD-10-CM | POA: Diagnosis not present

## 2018-10-07 DIAGNOSIS — K222 Esophageal obstruction: Secondary | ICD-10-CM | POA: Diagnosis not present

## 2018-10-07 DIAGNOSIS — D120 Benign neoplasm of cecum: Secondary | ICD-10-CM | POA: Diagnosis not present

## 2018-10-07 DIAGNOSIS — I10 Essential (primary) hypertension: Secondary | ICD-10-CM | POA: Diagnosis not present

## 2018-10-07 DIAGNOSIS — Z9289 Personal history of other medical treatment: Secondary | ICD-10-CM | POA: Diagnosis not present

## 2018-10-07 DIAGNOSIS — Z87891 Personal history of nicotine dependence: Secondary | ICD-10-CM | POA: Diagnosis not present

## 2018-10-07 DIAGNOSIS — K228 Other specified diseases of esophagus: Secondary | ICD-10-CM | POA: Diagnosis not present

## 2018-10-07 DIAGNOSIS — D123 Benign neoplasm of transverse colon: Secondary | ICD-10-CM | POA: Diagnosis not present

## 2018-10-08 DIAGNOSIS — E113393 Type 2 diabetes mellitus with moderate nonproliferative diabetic retinopathy without macular edema, bilateral: Secondary | ICD-10-CM | POA: Diagnosis not present

## 2018-10-23 ENCOUNTER — Other Ambulatory Visit: Payer: Self-pay

## 2018-10-23 NOTE — Patient Outreach (Signed)
[facility]) Care Management  10/23/2018  Jennifer Carrillo 1933/06/08 [id number]   Medication Adherence call to Jennifer Carrillo patient did not answer patient is past due on Telmisartan 80 mg under [place].   [place] Management Direct Dial [phone]  Fax [phone] [email or web address]

## 2018-10-27 ENCOUNTER — Ambulatory Visit
Admission: RE | Admit: 2018-10-27 | Discharge: 2018-10-27 | Disposition: A | Discharge status: Home / Self Care | Payer: Medicare Other | Source: Ambulatory Visit | Attending: Adult Health | Admitting: Adult Health

## 2018-10-27 ENCOUNTER — Ambulatory Visit (INDEPENDENT_AMBULATORY_CARE_PROVIDER_SITE_OTHER): Payer: Medicare Other | Admitting: Internal Medicine

## 2018-10-27 ENCOUNTER — Other Ambulatory Visit: Payer: Self-pay

## 2018-10-27 VITALS — BP 130/88 | HR 86 | Temp 97.3°F | Resp 16 | Ht 67.0 in | Wt 201.0 lb

## 2018-10-27 DIAGNOSIS — I10 Essential (primary) hypertension: Secondary | ICD-10-CM

## 2018-10-27 DIAGNOSIS — R0600 Dyspnea, unspecified: Secondary | ICD-10-CM | POA: Insufficient documentation

## 2018-10-27 DIAGNOSIS — R079 Chest pain, unspecified: Secondary | ICD-10-CM

## 2018-10-27 DIAGNOSIS — R0609 Other forms of dyspnea: Secondary | ICD-10-CM

## 2018-10-27 DIAGNOSIS — R0602 Shortness of breath: Secondary | ICD-10-CM | POA: Diagnosis not present

## 2018-10-27 NOTE — Progress Notes (Signed)
[facility] [place], [place] [address]  Pulmonary Sleep Medicine   Office Visit Note  Patient Name: Jennifer Carrillo DOB: 19-Feb-1933 MRN [id number]  Date of Service: 10/27/2018   Complaints/HPI: Pt is here to establish care with pulmonary.  She is a 85 yo female.  She reports over the last few months she has been having intermittent sob and DOE.  She sees Dr. Nehemiah Massed for cardiology.  She has not seen him in a few months.  She denies ever having sob like this. She reports she has been having intermittent chest pain that will last 10 minutes or so.    ROS  General: (-) fever, (-) chills, (-) night sweats, (-) weakness Skin: (-) rashes, (-) itching,. Eyes: (-) visual changes, (-) redness, (-) itching. Nose and Sinuses: (-) nasal stuffiness or itchiness, (-) postnasal drip, (-) nosebleeds, (-) sinus trouble. Mouth and Throat: (-) sore throat, (-) hoarseness. Neck: (-) swollen glands, (-) enlarged thyroid, (-) neck pain. Respiratory: - cough, (-) bloody sputum, + shortness of breath, - wheezing. Cardiovascular: + ankle swelling, (-) chest pain. Lymphatic: (-) lymph node enlargement. Neurologic: (-) numbness, (-) tingling. Psychiatric: (-) anxiety, (-) depression   Current Medication: Outpatient Encounter Medications as of 10/27/2018  Medication Sig Note  . ACCU-CHEK FASTCLIX LANCETS MISC by Does not apply route 3 (three) times daily after meals.   Marland Kitchen acetaminophen (TYLENOL) 500 MG tablet Take 1,000 mg by mouth every 6 (six) hours as needed for moderate pain or headache.   Marland Kitchen amLODipine (NORVASC) 10 MG tablet Take 1 tablet (10 mg total) by mouth daily.   Marland Kitchen anastrozole (ARIMIDEX) 1 MG tablet Take 1 mg by mouth every evening.    Marland Kitchen aspirin EC 81 MG EC tablet Take 1 tablet (81 mg total) by mouth daily.   Marland Kitchen atorvastatin (LIPITOR) 10 MG tablet Take 1 tablet (10 mg total) by mouth daily with supper. (Patient taking differently: Take 10 mg by mouth daily at 3 pm. )    . calcium carbonate (OSCAL) 1500 (600 Ca) MG TABS tablet Take 600 mg by mouth 4 (four) times a week.    . cholecalciferol (VITAMIN D-400) 10 MCG (400 UNIT) TABS tablet Take 400 Units by mouth 3 (three) times a week.   . clopidogrel (PLAVIX) 75 MG tablet TAKE 1 TABLET BY MOUTH DAILY (Patient taking differently: Take 75 mg by mouth daily with supper. )   . CRANBERRY PO Take 4,200 mg by mouth 4 (four) times a week.   . digoxin (LANOXIN) 0.125 MG tablet Take 1 tablet (0.125 mg total) by mouth daily.   Marland Kitchen docusate sodium (COLACE) 100 MG capsule Take 200 mg by mouth at bedtime as needed for mild constipation.    Marland Kitchen donepezil (ARICEPT) 10 MG tablet Take 1 tablet (10 mg total) by mouth at bedtime. Take one tab po qhs for memory   . ferrous sulfate 325 (65 FE) MG EC tablet Take 325 mg by mouth 4 (four) times a week.    Marland Kitchen glucose blood (ACCU-CHEK SMARTVIEW) test strip 3 each by Other route 3 (three) times daily after meals. Use as instructed   . hydrALAZINE (APRESOLINE) 25 MG tablet Take 1 tablet (25 mg total) by mouth 2 (two) times daily.   . hydrochlorothiazide (HYDRODIURIL) 12.5 MG tablet Take 12.5 mg by mouth daily.   Marland Kitchen ibandronate (BONIVA) 150 MG tablet Take 150 mg by mouth every 30 (thirty) days. Take in the morning with a full glass of water,  on an empty stomach, and do not take anything else by mouth or lie down for the next 30 min.   . insulin NPH-regular Human (70-30) 100 UNIT/ML injection Inject 30 Units into the skin 2 (two) times daily. 06/25/2018: Took 15 U this AM 0815  . isosorbide mononitrate (IMDUR) 30 MG 24 hr tablet Take 30 mg by mouth daily.    Marland Kitchen lidocaine (XYLOCAINE) 2 % solution Apply very small amount to affected area QID prn pain   . magnesium oxide (MAG-OX) 400 (241.3 Mg) MG tablet Take 1 tablet (400 mg total) by mouth daily. (Patient taking differently: Take 400 mg by mouth daily at 3 pm. )   . montelukast (SINGULAIR) 10 MG tablet Take 1 tablet (10 mg total) by mouth at bedtime.  (Patient taking differently: Take 10 mg by mouth daily as needed (allergies.). )   . pantoprazole (PROTONIX) 40 MG tablet TAKE 1 TABLET BY MOUTH  DAILY   . PROAIR HFA 108 (90 Base) MCG/ACT inhaler Inhale 2 puffs into the lungs every 6 (six) hours as needed for wheezing or shortness of breath.    Marland Kitchen rOPINIRole (REQUIP) 0.5 MG tablet Take 1 tablet (0.5 mg total) by mouth at bedtime.   Marland Kitchen telmisartan (MICARDIS) 80 MG tablet Take 1 tablet (80 mg total) by mouth daily. (Patient taking differently: Take 80 mg by mouth every evening. )   . vitamin C (ASCORBIC ACID) 500 MG tablet Take by mouth daily.     No facility-administered encounter medications on file as of 10/27/2018.     Surgical History: Past Surgical History:  Procedure Laterality Date  . BREAST SURGERY Left    left lumpectomy  . CAROTID ANGIOGRAPHY Left 06/25/2018   Procedure: CAROTID ANGIOGRAPHY, possible intervention;  Surgeon: Katha Cabal, MD;  Location: [place] CV LAB;  Service: Cardiovascular;  Laterality: Left;  . CAROTID ENDARTERECTOMY Right   . CAROTID PTA/STENT INTERVENTION Left 06/25/2018   Procedure: CAROTID PTA/STENT INTERVENTION;  Surgeon: Katha Cabal, MD;  Location: [place] CV LAB;  Service: Cardiovascular;  Laterality: Left;  . CATARACT EXTRACTION Right 2013  . CORONARY STENT PLACEMENT     L. L. E.  . EYE SURGERY Bilateral    cataract  . LEFT HEART CATH AND CORONARY ANGIOGRAPHY Left 05/06/2018   Procedure: LEFT HEART CATH AND CORONARY ANGIOGRAPHY;  Surgeon: Corey Skains, MD;  Location: [place] [facility] LAB;  Service: Cardiovascular;  Laterality: Left;  . LOWER EXTREMITY ANGIOGRAPHY Right 01/22/2018   Procedure: LOWER EXTREMITY ANGIOGRAPHY;  Surgeon: Katha Cabal, MD;  Location: [place] CV LAB;  Service: Cardiovascular;  Laterality: Right;  . RENAL ANGIOGRAPHY Left 02/26/2018   Procedure: RENAL ANGIOGRAPHY;  Surgeon: Katha Cabal, MD;  Location: [place] [facility];  Service:  Cardiovascular;  Laterality: Left;  . STENT PLACEMENT VASCULAR ([facility] HX) Left    stent placement on LLE  . TUBAL LIGATION      Medical History: Past Medical History:  Diagnosis Date  . Arthritis   . Asthma   . Calf pain   . Cancer [facility]) 2016   left breast  . COPD (chronic obstructive pulmonary disease) ([place])   . Diabetes ([place])   . Discoid lupus   . Gastro-esophageal reflux   . Glaucoma   . Hyperlipemia   . Hypertension   . Iron deficiency anemia   . Joint pain   . Leg swelling   . Osteoarthritis   . PVD (peripheral vascular disease) ([place])   .  Sinus problem   . Sleep apnea    No CPAP/ Can't tolerate  . Stroke [facility]) 1987    Family History: Family History  Problem Relation Age of Onset  . Heart disease Mother   . Diabetes Other   . Hypertension Other   . Diabetes Sister   . Lung cancer Brother   . Leukemia Daughter   . Bladder Cancer Neg Hx   . Kidney disease Neg Hx   . Prostate cancer Neg Hx     Social History: Social History   Socioeconomic History  . Marital status: Married    Spouse name: Not on file  . Number of children: Not on file  . Years of education: Not on file  . Highest education level: Not on file  Occupational History  . Not on file  Social Needs  . Financial resource strain: Not on file  . Food insecurity    Worry: Not on file    Inability: Not on file  . Transportation needs    Medical: Not on file    Non-medical: Not on file  Tobacco Use  . Smoking status: Former Smoker    Quit date: 1987    Years since quitting: 33.8  . Smokeless tobacco: Never Used  . Tobacco comment: quit 31 years  Substance and Sexual Activity  . Alcohol use: No  . Drug use: No  . Sexual activity: Not on file  Lifestyle  . Physical activity    Days per week: Not on file    Minutes per session: Not on file  . Stress: Not on file  Relationships  . Social Herbalist on phone: Not on file    Gets together: Not on file    Attends religious  service: Not on file    Active member of club or organization: Not on file    Attends meetings of clubs or organizations: Not on file    Relationship status: Not on file  . Intimate partner violence    Fear of current or ex partner: Not on file    Emotionally abused: Not on file    Physically abused: Not on file    Forced sexual activity: Not on file  Other Topics Concern  . Not on file  Social History Narrative  . Not on file    Vital Signs: Blood pressure 130/88, pulse 86, temperature (!) 97.3 F (36.3 C), resp. rate 16, height 5\' 7"  (1.702 m), weight 201 lb (91.2 kg), SpO2 98 %.  Examination: General Appearance: The patient is well-developed, well-nourished, and in no distress. Skin: Gross inspection of skin unremarkable. Head: normocephalic, no gross deformities. Eyes: no gross deformities noted. ENT: ears appear grossly normal no exudates. Neck: Supple. No thyromegaly. No LAD. Respiratory: clear bilaterally. Cardiovascular: Normal S1 and S2 without murmur or rub. Extremities: No cyanosis. pulses are equal, slight edema noted to ankles bilaterally. Neurologic: Alert and oriented. No involuntary movements.  LABS: No results found for this or any previous visit (from the past 2160 hour(s)).  Radiology: No results found.  No results found.  No results found.    Assessment and Plan: Patient Active Problem List   Diagnosis Date Noted  . Carotid stenosis, symptomatic w/o infarct 06/25/2018  . Hypertension 06/18/2018  . Carbuncle and furuncle of buttock 06/18/2018  . Coronary artery disease involving native coronary artery of native heart 05/19/2018  . Old inferior wall myocardial infarction 05/19/2018  . Stable angina ([place]) 03/31/2018  . Abnormal ECG  03/07/2018  . Benign essential HTN 03/07/2018  . Bilateral carotid artery stenosis 03/07/2018  . SOBOE (shortness of breath on exertion) 03/07/2018  . Atherosclerotic peripheral vascular disease with intermittent  claudication ([place]) 03/07/2018  . Carotid stenosis 02/26/2018  . Renal artery stenosis ([place]) 02/26/2018  . Anemia 02/05/2018  . Leg pain 01/13/2018  . PAD (peripheral artery disease) ([place]) 01/13/2018  . Diabetes mellitus without complication ([place]) 56/86/1683  . Renovascular hypertension 04/09/2017  . Primary osteoarthritis of both knees 03/12/2017  . Malignant neoplasm of breast in female, estrogen receptor positive ([place]) 12/16/2015  . Iron deficiency anemia 12/08/2015    1. DOE (dyspnea on exertion) EKG with RBBB, unchanged since last ekg.  Stat CXR shows no focal abnormality, no fluid.  Will get Echo and pFT.  - EKG 12-Lead - DG Chest 2 View; Future - Pulmonary Function Test; Future - ECHOCARDIOGRAM COMPLETE; Future  2. Chest pain, unspecified type Unchanged at this time.  - EKG 12-Lead  3. Essential hypertension Controlled, continue present management.   General Counseling: I have discussed the findings of the evaluation and examination with Tarra.  I have also discussed any further diagnostic evaluation thatmay be needed or ordered today. Bettie verbalizes understanding of the findings of todays visit. We also reviewed her medications today and discussed drug interactions and side effects including but not limited excessive drowsiness and altered mental states. We also discussed that there is always a risk not just to her but also people around her. she has been encouraged to call the office with any questions or concerns that should arise related to todays visit.    Time spent: 25 This patient was seen by Orson Gear AGNP-C in Collaboration with Dr. Devona Konig as a part of collaborative care agreement.   I have personally obtained a history, examined the patient, evaluated laboratory and imaging results, formulated the assessment and plan and placed orders.    Allyne Gee, MD [facility] Pulmonary and Critical Care Sleep medicine

## 2018-11-10 DIAGNOSIS — I25118 Atherosclerotic heart disease of native coronary artery with other forms of angina pectoris: Secondary | ICD-10-CM | POA: Diagnosis not present

## 2018-11-10 DIAGNOSIS — I119 Hypertensive heart disease without heart failure: Secondary | ICD-10-CM | POA: Insufficient documentation

## 2018-11-10 DIAGNOSIS — R079 Chest pain, unspecified: Secondary | ICD-10-CM | POA: Diagnosis not present

## 2018-11-10 DIAGNOSIS — I10 Essential (primary) hypertension: Secondary | ICD-10-CM | POA: Diagnosis not present

## 2018-11-12 ENCOUNTER — Ambulatory Visit: Payer: Medicare Other | Admitting: Internal Medicine

## 2018-11-14 ENCOUNTER — Other Ambulatory Visit: Payer: Medicare Other

## 2018-11-17 DIAGNOSIS — C50512 Malignant neoplasm of lower-outer quadrant of left female breast: Secondary | ICD-10-CM | POA: Diagnosis not present

## 2018-11-17 DIAGNOSIS — D508 Other iron deficiency anemias: Secondary | ICD-10-CM | POA: Diagnosis not present

## 2018-11-17 DIAGNOSIS — D649 Anemia, unspecified: Secondary | ICD-10-CM | POA: Diagnosis not present

## 2018-11-17 DIAGNOSIS — Z170 Estrogen receptor positive status [ER+]: Secondary | ICD-10-CM | POA: Diagnosis not present

## 2018-11-18 DIAGNOSIS — D649 Anemia, unspecified: Secondary | ICD-10-CM | POA: Diagnosis not present

## 2018-11-24 ENCOUNTER — Ambulatory Visit: Payer: Medicare Other | Admitting: Internal Medicine

## 2018-11-24 DIAGNOSIS — Z7983 Long term (current) use of bisphosphonates: Secondary | ICD-10-CM | POA: Diagnosis not present

## 2018-11-24 DIAGNOSIS — Z9010 Acquired absence of unspecified breast and nipple: Secondary | ICD-10-CM | POA: Diagnosis not present

## 2018-11-24 DIAGNOSIS — C50912 Malignant neoplasm of unspecified site of left female breast: Secondary | ICD-10-CM | POA: Diagnosis not present

## 2018-11-24 DIAGNOSIS — D509 Iron deficiency anemia, unspecified: Secondary | ICD-10-CM | POA: Diagnosis not present

## 2018-11-24 DIAGNOSIS — Z8673 Personal history of transient ischemic attack (TIA), and cerebral infarction without residual deficits: Secondary | ICD-10-CM | POA: Diagnosis not present

## 2018-11-24 DIAGNOSIS — Z7982 Long term (current) use of aspirin: Secondary | ICD-10-CM | POA: Diagnosis not present

## 2018-11-24 DIAGNOSIS — Z170 Estrogen receptor positive status [ER+]: Secondary | ICD-10-CM | POA: Diagnosis not present

## 2018-11-24 DIAGNOSIS — Z7902 Long term (current) use of antithrombotics/antiplatelets: Secondary | ICD-10-CM | POA: Diagnosis not present

## 2018-11-24 DIAGNOSIS — Z801 Family history of malignant neoplasm of trachea, bronchus and lung: Secondary | ICD-10-CM | POA: Diagnosis not present

## 2018-11-24 DIAGNOSIS — E119 Type 2 diabetes mellitus without complications: Secondary | ICD-10-CM | POA: Diagnosis not present

## 2018-11-24 DIAGNOSIS — I10 Essential (primary) hypertension: Secondary | ICD-10-CM | POA: Diagnosis not present

## 2018-11-24 DIAGNOSIS — Z87891 Personal history of nicotine dependence: Secondary | ICD-10-CM | POA: Diagnosis not present

## 2018-11-24 DIAGNOSIS — Z79811 Long term (current) use of aromatase inhibitors: Secondary | ICD-10-CM | POA: Diagnosis not present

## 2018-11-24 DIAGNOSIS — K922 Gastrointestinal hemorrhage, unspecified: Secondary | ICD-10-CM | POA: Diagnosis not present

## 2018-11-24 DIAGNOSIS — K219 Gastro-esophageal reflux disease without esophagitis: Secondary | ICD-10-CM | POA: Diagnosis not present

## 2018-11-24 DIAGNOSIS — Z9289 Personal history of other medical treatment: Secondary | ICD-10-CM | POA: Diagnosis not present

## 2018-11-24 DIAGNOSIS — M25569 Pain in unspecified knee: Secondary | ICD-10-CM | POA: Diagnosis not present

## 2018-12-09 DIAGNOSIS — D508 Other iron deficiency anemias: Secondary | ICD-10-CM | POA: Diagnosis not present

## 2018-12-09 DIAGNOSIS — Z170 Estrogen receptor positive status [ER+]: Secondary | ICD-10-CM | POA: Diagnosis not present

## 2018-12-09 DIAGNOSIS — D649 Anemia, unspecified: Secondary | ICD-10-CM | POA: Diagnosis not present

## 2018-12-09 DIAGNOSIS — C50512 Malignant neoplasm of lower-outer quadrant of left female breast: Secondary | ICD-10-CM | POA: Diagnosis not present

## 2018-12-11 ENCOUNTER — Other Ambulatory Visit: Payer: Self-pay | Admitting: Internal Medicine

## 2018-12-11 NOTE — Telephone Encounter (Signed)
It can be virtual, also I would call one of her daughters

## 2018-12-11 NOTE — Telephone Encounter (Signed)
Called patient to schedule a follow up patient does not want to come in at this time will call back to reschedule appointment. klh

## 2018-12-16 NOTE — Telephone Encounter (Signed)
Per patients husband patient has so many prior appointments and infusions that are scheduled patient would like to wait till first of year to schedule appointment for a follow up. kim

## 2018-12-23 ENCOUNTER — Other Ambulatory Visit: Payer: Self-pay | Admitting: Adult Health

## 2018-12-23 DIAGNOSIS — C50512 Malignant neoplasm of lower-outer quadrant of left female breast: Secondary | ICD-10-CM

## 2018-12-23 DIAGNOSIS — I70203 Unspecified atherosclerosis of native arteries of extremities, bilateral legs: Secondary | ICD-10-CM

## 2019-01-21 DIAGNOSIS — M25561 Pain in right knee: Secondary | ICD-10-CM | POA: Diagnosis not present

## 2019-01-21 DIAGNOSIS — G8929 Other chronic pain: Secondary | ICD-10-CM | POA: Diagnosis not present

## 2019-01-21 DIAGNOSIS — D649 Anemia, unspecified: Secondary | ICD-10-CM | POA: Diagnosis not present

## 2019-01-21 DIAGNOSIS — C50512 Malignant neoplasm of lower-outer quadrant of left female breast: Secondary | ICD-10-CM | POA: Diagnosis not present

## 2019-01-21 DIAGNOSIS — Z170 Estrogen receptor positive status [ER+]: Secondary | ICD-10-CM | POA: Diagnosis not present

## 2019-01-21 DIAGNOSIS — M5430 Sciatica, unspecified side: Secondary | ICD-10-CM | POA: Diagnosis not present

## 2019-01-28 ENCOUNTER — Telehealth: Payer: Self-pay

## 2019-01-28 NOTE — Telephone Encounter (Signed)
Contacted patient to reschedule awv from 09/2018 and patient stated will call back in February to schedule.

## 2019-02-02 DIAGNOSIS — D509 Iron deficiency anemia, unspecified: Secondary | ICD-10-CM | POA: Diagnosis not present

## 2019-02-02 DIAGNOSIS — D649 Anemia, unspecified: Secondary | ICD-10-CM | POA: Diagnosis not present

## 2019-02-03 ENCOUNTER — Other Ambulatory Visit: Payer: Self-pay

## 2019-02-03 DIAGNOSIS — I482 Chronic atrial fibrillation, unspecified: Secondary | ICD-10-CM

## 2019-02-03 DIAGNOSIS — I10 Essential (primary) hypertension: Secondary | ICD-10-CM

## 2019-02-03 DIAGNOSIS — I70203 Unspecified atherosclerosis of native arteries of extremities, bilateral legs: Secondary | ICD-10-CM

## 2019-02-03 MED ORDER — MAGNESIUM OXIDE 400 (241.3 MG) MG PO TABS: 400.0000 mg | ORAL_TABLET | Freq: Every day | ORAL | 0 refills | Status: DC

## 2019-02-03 MED ORDER — AMLODIPINE BESYLATE 10 MG PO TABS: 10.0000 mg | ORAL_TABLET | Freq: Every day | ORAL | 0 refills | Status: DC

## 2019-02-03 MED ORDER — ISOSORBIDE MONONITRATE ER 30 MG PO TB24: 30.0000 mg | ORAL_TABLET | Freq: Every day | ORAL | 0 refills | Status: DC

## 2019-02-03 MED ORDER — DIGOXIN 125 MCG PO TABS: 0.1250 mg | ORAL_TABLET | Freq: Every day | ORAL | 0 refills | Status: DC

## 2019-02-03 MED ORDER — CLOPIDOGREL BISULFATE 75 MG PO TABS: ORAL_TABLET | ORAL | 0 refills | Status: DC

## 2019-02-03 MED ORDER — TELMISARTAN 80 MG PO TABS: 80.0000 mg | ORAL_TABLET | Freq: Every day | ORAL | 0 refills | Status: DC

## 2019-02-03 MED ORDER — HYDRALAZINE HCL 25 MG PO TABS: 25.0000 mg | ORAL_TABLET | Freq: Two times a day (BID) | ORAL | 0 refills | Status: DC

## 2019-02-03 MED ORDER — DONEPEZIL HCL 10 MG PO TABS: ORAL_TABLET | ORAL | 0 refills | Status: DC

## 2019-02-11 ENCOUNTER — Telehealth: Payer: Self-pay

## 2019-02-11 NOTE — Telephone Encounter (Signed)
Confirmed appointment on 02/13/2019 and screened for covid. klh

## 2019-02-13 ENCOUNTER — Other Ambulatory Visit: Payer: Self-pay

## 2019-02-13 ENCOUNTER — Ambulatory Visit: Payer: Medicare Other | Admitting: Adult Health

## 2019-02-13 ENCOUNTER — Telehealth: Payer: Self-pay

## 2019-02-13 ENCOUNTER — Ambulatory Visit (INDEPENDENT_AMBULATORY_CARE_PROVIDER_SITE_OTHER): Payer: Medicare HMO | Admitting: Adult Health

## 2019-02-13 ENCOUNTER — Encounter: Payer: Self-pay | Admitting: Adult Health

## 2019-02-13 VITALS — BP 188/65 | HR 88 | Temp 97.3°F | Resp 16 | Ht 67.0 in | Wt 202.6 lb

## 2019-02-13 DIAGNOSIS — E0789 Other specified disorders of thyroid: Secondary | ICD-10-CM | POA: Diagnosis not present

## 2019-02-13 DIAGNOSIS — E1165 Type 2 diabetes mellitus with hyperglycemia: Secondary | ICD-10-CM

## 2019-02-13 DIAGNOSIS — G8929 Other chronic pain: Secondary | ICD-10-CM

## 2019-02-13 DIAGNOSIS — E782 Mixed hyperlipidemia: Secondary | ICD-10-CM | POA: Diagnosis not present

## 2019-02-13 DIAGNOSIS — E039 Hypothyroidism, unspecified: Secondary | ICD-10-CM | POA: Diagnosis not present

## 2019-02-13 DIAGNOSIS — M25561 Pain in right knee: Secondary | ICD-10-CM | POA: Diagnosis not present

## 2019-02-13 DIAGNOSIS — D509 Iron deficiency anemia, unspecified: Secondary | ICD-10-CM

## 2019-02-13 DIAGNOSIS — Z0001 Encounter for general adult medical examination with abnormal findings: Secondary | ICD-10-CM | POA: Diagnosis not present

## 2019-02-13 DIAGNOSIS — I10 Essential (primary) hypertension: Secondary | ICD-10-CM

## 2019-02-13 LAB — POCT GLYCOSYLATED HEMOGLOBIN (HGB A1C): Hemoglobin A1C: 7.2 % — AB (ref 4.0–5.6)

## 2019-02-13 MED ORDER — ESCITALOPRAM OXALATE 10 MG PO TABS: 10.0000 mg | ORAL_TABLET | Freq: Every day | ORAL | 1 refills | Status: DC

## 2019-02-13 NOTE — Telephone Encounter (Signed)
Pt daughter called that pt don't want to take lexapro they like to canceled pres as per adam called phar and canceled lexapro and advised if they change mind call us back

## 2019-02-13 NOTE — Progress Notes (Signed)
[facility] [place],  [address]  Internal MEDICINE  Office Visit Note  Patient Name: Jennifer Carrillo  [id number]  [id number]  Date of Service: 02/13/2019  Chief Complaint  Patient presents with  . Medicare Wellness    bp is high she forgot take her BP med in the morning   . Hypertension  . Hyperlipidemia  . Diabetes     HPI Pt is here for routine health maintenance examination. Patient is tearful today and expresses sadness due to the loss of her daughter recently. She explains she feels depressed, is having difficulty sleeping at night and cries most of the day. Her other children are very supportive and have been checking in on her frequently. Her blood pressure today is elevated, typically elevated on initial check and improves with re-check. Today, re-check still elevated, reports she has not taken her daily medications yet and that she felt rushed this morning to get to her appointment. Denies chest pain, headaches or visual changes. Reports that when she checks her BP at home it usually runs in the 140's. Followed closely by hematology as she has iron deficiency anemia, last iron infusion was in January. Scheduled to see her hematologist again in the next few weeks. They routinely monitor her CBC levels and iron panels. A1C today 7.2%, same as last check. No issues to report, continues to take 70/30 insulin. Continues to have complaints of right knee pain, this is a chronic issue for her. She continues to use acetaminophen as needed for pain that does help relieve her symptoms. Reports she has a good appetite, no issues with urinating or bowel movements, ambulating well with a cane and walker at times, no recent falls.  Current Medication: Outpatient Encounter Medications as of 02/13/2019  Medication Sig Note  . ACCU-CHEK FASTCLIX LANCETS MISC by Does not apply route 3 (three) times daily after meals.   Marland Kitchen acetaminophen (TYLENOL) 500 MG tablet Take  1,000 mg by mouth every 6 (six) hours as needed for moderate pain or headache.   Marland Kitchen amLODipine (NORVASC) 10 MG tablet Take 1 tablet (10 mg total) by mouth daily.   Marland Kitchen anastrozole (ARIMIDEX) 1 MG tablet Take 1 mg by mouth every evening.    Marland Kitchen aspirin EC 81 MG EC tablet Take 1 tablet (81 mg total) by mouth daily.   Marland Kitchen atorvastatin (LIPITOR) 10 MG tablet Take 1 tablet (10 mg total) by mouth daily with supper. (Patient taking differently: Take 10 mg by mouth daily at 3 pm. )   . calcium carbonate (OSCAL) 1500 (600 Ca) MG TABS tablet Take 600 mg by mouth 4 (four) times a week.    . cholecalciferol (VITAMIN D-400) 10 MCG (400 UNIT) TABS tablet Take 400 Units by mouth 3 (three) times a week.   . clopidogrel (PLAVIX) 75 MG tablet TAKE 1 TABLET BY MOUTH  DAILY WITH SUPPER   . CRANBERRY PO Take 4,200 mg by mouth 4 (four) times a week.   . digoxin (LANOXIN) 0.125 MG tablet Take 1 tablet (0.125 mg total) by mouth daily.   Marland Kitchen docusate sodium (COLACE) 100 MG capsule Take 200 mg by mouth at bedtime as needed for mild constipation.    Marland Kitchen donepezil (ARICEPT) 10 MG tablet TAKE 1 TABLET BY MOUTH AT  BEDTIME FOR MEMORY   . ferrous sulfate 325 (65 FE) MG EC tablet Take 325 mg by mouth 4 (four) times a week.    Marland Kitchen glucose blood (ACCU-CHEK SMARTVIEW) test strip 3  each by Other route 3 (three) times daily after meals. Use as instructed   . hydrALAZINE (APRESOLINE) 25 MG tablet Take 1 tablet (25 mg total) by mouth 2 (two) times daily.   . hydrochlorothiazide (HYDRODIURIL) 12.5 MG tablet Take 12.5 mg by mouth daily.   Marland Kitchen ibandronate (BONIVA) 150 MG tablet Take 150 mg by mouth every 30 (thirty) days. Take in the morning with a full glass of water, on an empty stomach, and do not take anything else by mouth or lie down for the next 30 min.   . insulin NPH-regular Human (70-30) 100 UNIT/ML injection Inject 30 Units into the skin 2 (two) times daily. 06/25/2018: Took 15 U this AM 0815  . isosorbide mononitrate (IMDUR) 30 MG 24 hr tablet  Take 1 tablet (30 mg total) by mouth daily.   Marland Kitchen lidocaine (XYLOCAINE) 2 % solution Apply very small amount to affected area QID prn pain   . magnesium oxide (MAG-OX) 400 (241.3 Mg) MG tablet Take 1 tablet (400 mg total) by mouth daily.   . montelukast (SINGULAIR) 10 MG tablet Take 1 tablet (10 mg total) by mouth at bedtime. (Patient taking differently: Take 10 mg by mouth daily as needed (allergies.). )   . pantoprazole (PROTONIX) 40 MG tablet TAKE 1 TABLET BY MOUTH  DAILY   . PROAIR HFA 108 (90 Base) MCG/ACT inhaler Inhale 2 puffs into the lungs every 6 (six) hours as needed for wheezing or shortness of breath.    Marland Kitchen rOPINIRole (REQUIP) 0.5 MG tablet TAKE 1 TABLET BY MOUTH AT  BEDTIME   . telmisartan (MICARDIS) 80 MG tablet Take 1 tablet (80 mg total) by mouth daily.   . vitamin C (ASCORBIC ACID) 500 MG tablet Take by mouth daily.    Marland Kitchen FLUZONE HIGH-DOSE QUADRIVALENT 0.7 ML SUSY     No facility-administered encounter medications on file as of 02/13/2019.    Surgical History: Past Surgical History:  Procedure Laterality Date  . BREAST SURGERY Left    left lumpectomy  . CAROTID ANGIOGRAPHY Left 06/25/2018   Procedure: CAROTID ANGIOGRAPHY, possible intervention;  Surgeon: Katha Cabal, MD;  Location: [place] [facility];  Service: Cardiovascular;  Laterality: Left;  . CAROTID ENDARTERECTOMY Right   . CAROTID PTA/STENT INTERVENTION Left 06/25/2018   Procedure: CAROTID PTA/STENT INTERVENTION;  Surgeon: Katha Cabal, MD;  Location: [place] [facility];  Service: Cardiovascular;  Laterality: Left;  . CATARACT EXTRACTION Right 2013  . CORONARY STENT PLACEMENT     L. L. E.  . EYE SURGERY Bilateral    cataract  . LEFT HEART CATH AND CORONARY ANGIOGRAPHY Left 05/06/2018   Procedure: LEFT HEART CATH AND CORONARY ANGIOGRAPHY;  Surgeon: Corey Skains, MD;  Location: [place] [facility];  Service: Cardiovascular;  Laterality: Left;  . LOWER EXTREMITY ANGIOGRAPHY Right 01/22/2018    Procedure: LOWER EXTREMITY ANGIOGRAPHY;  Surgeon: Katha Cabal, MD;  Location: [place] [facility];  Service: Cardiovascular;  Laterality: Right;  . RENAL ANGIOGRAPHY Left 02/26/2018   Procedure: RENAL ANGIOGRAPHY;  Surgeon: Katha Cabal, MD;  Location: [place] [facility];  Service: Cardiovascular;  Laterality: Left;  . STENT PLACEMENT VASCULAR ([facility] HX) Left    stent placement on LLE  . TUBAL LIGATION      Medical History: Past Medical History:  Diagnosis Date  . Arthritis   . Asthma   . Calf pain   . Cancer [facility]) 2016   left breast  . COPD (chronic obstructive pulmonary disease) (  [place])   . Diabetes ([place])   . Discoid lupus   . Gastro-esophageal reflux   . Glaucoma   . Hyperlipemia   . Hypertension   . Iron deficiency anemia   . Joint pain   . Leg swelling   . Osteoarthritis   . PVD (peripheral vascular disease) ([place])   . Sinus problem   . Sleep apnea    No CPAP/ Can't tolerate  . Stroke [facility]) 1987    Family History: Family History  Problem Relation Age of Onset  . Heart disease Mother   . Diabetes Other   . Hypertension Other   . Diabetes Sister   . Lung cancer Brother   . Leukemia Daughter   . Bladder Cancer Neg Hx   . Kidney disease Neg Hx   . Prostate cancer Neg Hx     Review of Systems  Constitutional: Negative for chills, fatigue and unexpected weight change.  HENT: Negative for congestion, rhinorrhea, sneezing and sore throat.   Eyes: Negative for photophobia, pain and redness.  Respiratory: Negative for cough, chest tightness and shortness of breath.   Cardiovascular: Negative for chest pain and palpitations.  Gastrointestinal: Negative for abdominal pain, constipation, diarrhea, nausea and vomiting.  Endocrine: Negative.   Genitourinary: Negative for dysuria and frequency.  Musculoskeletal: Negative for arthralgias, back pain, joint swelling and neck pain.       Right knee pain, chronic  Skin: Negative for rash.  Allergic/Immunologic:  Negative.   Neurological: Negative for tremors and numbness.  Hematological: Negative for adenopathy. Does not bruise/bleed easily.  Psychiatric/Behavioral: Negative for behavioral problems and sleep disturbance. The patient is not nervous/anxious.        Sad and depressed moods   Vital Signs: BP (!) 188/65   Pulse 88   Temp (!) 97.3 F (36.3 C)   Resp 16   Ht 5\' 7"  (1.702 m)   Wt 202 lb 9.6 oz (91.9 kg)   SpO2 99%   BMI 31.73 kg/m    Physical Exam Vitals and nursing note reviewed.  Constitutional:      General: She is not in acute distress.    Appearance: She is well-developed. She is not diaphoretic.  HENT:     Head: Normocephalic and atraumatic.     Mouth/Throat:     Pharynx: No oropharyngeal exudate.  Eyes:     Pupils: Pupils are equal, round, and reactive to light.  Neck:     Thyroid: No thyromegaly.     Vascular: No JVD.     Trachea: No tracheal deviation.  Cardiovascular:     Rate and Rhythm: Normal rate and regular rhythm.     Heart sounds: Normal heart sounds. No murmur. No friction rub. No gallop.   Pulmonary:     Effort: Pulmonary effort is normal. No respiratory distress.     Breath sounds: Normal breath sounds. No wheezing or rales.  Chest:     Chest wall: No tenderness.  Abdominal:     Palpations: Abdomen is soft.     Tenderness: There is no abdominal tenderness. There is no guarding.  Musculoskeletal:        General: Normal range of motion.     Cervical back: Normal range of motion and neck supple.  Lymphadenopathy:     Cervical: No cervical adenopathy.  Skin:    General: Skin is warm and dry.  Neurological:     Mental Status: She is alert and oriented to person, place, and time.  Cranial Nerves: No cranial nerve deficit.  Psychiatric:        Mood and Affect: Affect is tearful.        Behavior: Behavior normal.        Thought Content: Thought content normal.        Cognition and Memory: Memory is impaired.        Judgment: Judgment normal.     LABS: Recent Results (from the past 2160 hour(s))  POCT HgB A1C     Status: Abnormal   Collection Time: 02/13/19  9:44 AM  Result Value Ref Range   Hemoglobin A1C 7.2 (A) 4.0 - 5.6 %   HbA1c POC (<> result, manual entry)     HbA1c, POC (prediabetic range)     HbA1c, POC (controlled diabetic range)       Assessment/Plan: 1. Encounter for general adult medical examination with abnormal findings Well appearing 86 year old female, up to date on PHM. - Lipid Panel With LDL/HDL Ratio - TSH - T4, free - Comprehensive metabolic panel  2. Uncontrolled type 2 diabetes mellitus with hyperglycemia (HCC) A1C today 7.2%, continues to be stable on current therapy, continue to monitor. - POCT HgB A1C  3. Essential hypertension Possible white coat syndrome as her BP is typically elevated at office visits, usually improved on re-check. Today re-check still elevated, reports she has not taken her daily medications and was stressed for time to get to her appointment this morning.  4. Iron deficiency anemia, unspecified iron deficiency anemia type Followed closely by hematology, last iron infusion was in January. Also taking a daily iron supplement. Next appointment with hematology 2/15. Continue to monitor iron panel.  5. Chronic pain of right knee Stable chronic pain, using walker and cane to ambulate. Uses acetaminophen as needed for pain control. Continue to monitor.  General Counseling: aviannah castoro understanding of the findings of todays visit and agrees with plan of treatment. I have discussed any further diagnostic evaluation that may be needed or ordered today. We also reviewed her medications today. she has been encouraged to call the office with any questions or concerns that should arise related to todays visit.   Orders Placed This Encounter  Procedures  . POCT HgB A1C    No orders of the defined types were placed in this encounter.   Time spent: 30 Minutes   This  patient was seen by Orson Gear AGNP-C in Collaboration with Dr [name] as a part of collaborative care agreement    Kendell Bane AGNP-C Internal Medicine

## 2019-02-14 LAB — LIPID PANEL WITH LDL/HDL RATIO
Cholesterol, Total: 347 mg/dL — ABNORMAL HIGH (ref 100–199)
HDL: 47 mg/dL (ref >39)
LDL Chol Calc (NIH): 239 mg/dL — ABNORMAL HIGH (ref 0–99)
LDL/HDL Ratio: 5.1 ratio — ABNORMAL HIGH (ref 0.0–3.2)
Triglycerides: 288 mg/dL — ABNORMAL HIGH (ref 0–149)
VLDL Cholesterol Cal: 61 mg/dL — ABNORMAL HIGH (ref 5–40)

## 2019-02-14 LAB — TSH: TSH: 3.5 u[IU]/mL (ref 0.450–4.500)

## 2019-02-14 LAB — COMPREHENSIVE METABOLIC PANEL
ALT: 10 IU/L (ref 0–32)
AST: 12 IU/L (ref 0–40)
Albumin/Globulin Ratio: 1.3 (ref 1.2–2.2)
Albumin: 3.5 g/dL — ABNORMAL LOW (ref 3.6–4.6)
Alkaline Phosphatase: 91 IU/L (ref 39–117)
BUN/Creatinine Ratio: 14 (ref 12–28)
BUN: 25 mg/dL (ref 8–27)
Bilirubin Total: 0.2 mg/dL (ref 0.0–1.2)
CO2: 21 mmol/L (ref 20–29)
Calcium: 9.5 mg/dL (ref 8.7–10.3)
Chloride: 108 mmol/L — ABNORMAL HIGH (ref 96–106)
Creatinine, Ser: 1.73 mg/dL — ABNORMAL HIGH (ref 0.57–1.00)
GFR calc Af Amer: 31 mL/min/{1.73_m2} — ABNORMAL LOW (ref >59)
GFR calc non Af Amer: 27 mL/min/{1.73_m2} — ABNORMAL LOW (ref >59)
Globulin, Total: 2.7 g/dL (ref 1.5–4.5)
Glucose: 157 mg/dL — ABNORMAL HIGH (ref 65–99)
Potassium: 4.6 mmol/L (ref 3.5–5.2)
Sodium: 142 mmol/L (ref 134–144)
Total Protein: 6.2 g/dL (ref 6.0–8.5)

## 2019-02-14 LAB — T4, FREE: Free T4: 1 ng/dL (ref 0.82–1.77)

## 2019-03-24 ENCOUNTER — Telehealth: Payer: Self-pay

## 2019-03-24 NOTE — Telephone Encounter (Signed)
CONFIRMED AND SCREENED FOR 03-26-19 OV.

## 2019-03-26 ENCOUNTER — Encounter: Payer: Self-pay | Admitting: Adult Health

## 2019-03-26 ENCOUNTER — Other Ambulatory Visit: Payer: Self-pay

## 2019-03-26 ENCOUNTER — Ambulatory Visit (INDEPENDENT_AMBULATORY_CARE_PROVIDER_SITE_OTHER): Payer: Medicare HMO | Admitting: Adult Health

## 2019-03-26 VITALS — BP 198/72 | HR 66 | Temp 97.3°F | Resp 16 | Ht 67.0 in | Wt 204.0 lb

## 2019-03-26 DIAGNOSIS — M25561 Pain in right knee: Secondary | ICD-10-CM

## 2019-03-26 DIAGNOSIS — K219 Gastro-esophageal reflux disease without esophagitis: Secondary | ICD-10-CM | POA: Diagnosis not present

## 2019-03-26 DIAGNOSIS — E782 Mixed hyperlipidemia: Secondary | ICD-10-CM | POA: Diagnosis not present

## 2019-03-26 DIAGNOSIS — R413 Other amnesia: Secondary | ICD-10-CM | POA: Diagnosis not present

## 2019-03-26 DIAGNOSIS — E1165 Type 2 diabetes mellitus with hyperglycemia: Secondary | ICD-10-CM | POA: Diagnosis not present

## 2019-03-26 DIAGNOSIS — G8929 Other chronic pain: Secondary | ICD-10-CM | POA: Diagnosis not present

## 2019-03-26 DIAGNOSIS — I10 Essential (primary) hypertension: Secondary | ICD-10-CM | POA: Diagnosis not present

## 2019-03-26 DIAGNOSIS — D509 Iron deficiency anemia, unspecified: Secondary | ICD-10-CM | POA: Diagnosis not present

## 2019-03-26 MED ORDER — CELECOXIB 200 MG PO CAPS: 200.0000 mg | ORAL_CAPSULE | Freq: Two times a day (BID) | ORAL | 1 refills | Status: DC | PRN

## 2019-03-26 NOTE — Progress Notes (Signed)
[facility] [place], [place] [address]  Internal MEDICINE  Office Visit Note  Patient Name: Jennifer Carrillo  [id number]  [id number]  Date of Service: 03/26/2019  Chief Complaint  Patient presents with  . Hypertension  . Hyperlipidemia  . Gastroesophageal Reflux  . Diabetes  . Follow-up    review labs  . Knee Pain    has gotten worse  . Medication Refill    celecoxib 200 mg cap    HPI Pt is here for follow up on HTN, HLD, GERD.  We reviewed her labs today.  Her cholesterol is very high.  Patient reports she is no longer cooking and is eating out for every meal.  We discussed making better choices for food, and avoiding fried, greasy or fatty foods.  Her sugar was elevated, her A1C was 7.2. She continues have bilateral knee pain. She reports ortho wants her to have knee replacement but she is not interested in this. She states "i'm almost 86 years old, i'm not going through that misery". Her blood pressure is initially elevated today.  On recheck was somewhat improved (172/76).     Current Medication: Outpatient Encounter Medications as of 03/26/2019  Medication Sig Note  . ACCU-CHEK FASTCLIX LANCETS MISC by Does not apply route 3 (three) times daily after meals.   Marland Kitchen acetaminophen (TYLENOL) 500 MG tablet Take 1,000 mg by mouth every 6 (six) hours as needed for moderate pain or headache.   Marland Kitchen amLODipine (NORVASC) 10 MG tablet Take 1 tablet (10 mg total) by mouth daily.   Marland Kitchen anastrozole (ARIMIDEX) 1 MG tablet Take 1 mg by mouth every evening.    Marland Kitchen aspirin EC 81 MG EC tablet Take 1 tablet (81 mg total) by mouth daily.   Marland Kitchen atorvastatin (LIPITOR) 10 MG tablet Take 1 tablet (10 mg total) by mouth daily with supper. (Patient taking differently: Take 10 mg by mouth daily at 3 pm. )   . calcium carbonate (OSCAL) 1500 (600 Ca) MG TABS tablet Take 600 mg by mouth 4 (four) times a week.    . cholecalciferol (VITAMIN D-400) 10 MCG (400 UNIT) TABS tablet Take 400 Units  by mouth 3 (three) times a week.   . clopidogrel (PLAVIX) 75 MG tablet TAKE 1 TABLET BY MOUTH  DAILY WITH SUPPER   . CRANBERRY PO Take 4,200 mg by mouth 4 (four) times a week.   . digoxin (LANOXIN) 0.125 MG tablet Take 1 tablet (0.125 mg total) by mouth daily.   Marland Kitchen docusate sodium (COLACE) 100 MG capsule Take 200 mg by mouth at bedtime as needed for mild constipation.    Marland Kitchen donepezil (ARICEPT) 10 MG tablet TAKE 1 TABLET BY MOUTH AT  BEDTIME FOR MEMORY   . escitalopram (LEXAPRO) 10 MG tablet Take 1 tablet (10 mg total) by mouth at bedtime.   . ferrous sulfate 325 (65 FE) MG EC tablet Take 325 mg by mouth 4 (four) times a week.    Marland Kitchen FLUZONE HIGH-DOSE QUADRIVALENT 0.7 ML SUSY    . glucose blood (ACCU-CHEK SMARTVIEW) test strip 3 each by Other route 3 (three) times daily after meals. Use as instructed   . hydrALAZINE (APRESOLINE) 25 MG tablet Take 1 tablet (25 mg total) by mouth 2 (two) times daily.   . hydrochlorothiazide (HYDRODIURIL) 12.5 MG tablet Take 12.5 mg by mouth daily.   Marland Kitchen ibandronate (BONIVA) 150 MG tablet Take 150 mg by mouth every 30 (thirty) days. Take in the morning with a  full glass of water, on an empty stomach, and do not take anything else by mouth or lie down for the next 30 min.   . insulin NPH-regular Human (70-30) 100 UNIT/ML injection Inject 30 Units into the skin 2 (two) times daily. 06/25/2018: Took 15 U this AM 0815  . isosorbide mononitrate (IMDUR) 30 MG 24 hr tablet Take 1 tablet (30 mg total) by mouth daily.   Marland Kitchen lidocaine (XYLOCAINE) 2 % solution Apply very small amount to affected area QID prn pain   . magnesium oxide (MAG-OX) 400 (241.3 Mg) MG tablet Take 1 tablet (400 mg total) by mouth daily.   . montelukast (SINGULAIR) 10 MG tablet Take 1 tablet (10 mg total) by mouth at bedtime. (Patient taking differently: Take 10 mg by mouth daily as needed (allergies.). )   . pantoprazole (PROTONIX) 40 MG tablet TAKE 1 TABLET BY MOUTH  DAILY   . PROAIR HFA 108 (90 Base) MCG/ACT  inhaler Inhale 2 puffs into the lungs every 6 (six) hours as needed for wheezing or shortness of breath.    Marland Kitchen rOPINIRole (REQUIP) 0.5 MG tablet TAKE 1 TABLET BY MOUTH AT  BEDTIME   . telmisartan (MICARDIS) 80 MG tablet Take 1 tablet (80 mg total) by mouth daily.   . vitamin C (ASCORBIC ACID) 500 MG tablet Take by mouth daily.    . celecoxib (CELEBREX) 200 MG capsule Take 1 capsule (200 mg total) by mouth 2 (two) times daily as needed.    No facility-administered encounter medications on file as of 03/26/2019.    Surgical History: Past Surgical History:  Procedure Laterality Date  . BREAST SURGERY Left    left lumpectomy  . CAROTID ANGIOGRAPHY Left 06/25/2018   Procedure: CAROTID ANGIOGRAPHY, possible intervention;  Surgeon: Katha Cabal, MD;  Location: [place] [facility];  Service: Cardiovascular;  Laterality: Left;  . CAROTID ENDARTERECTOMY Right   . CAROTID PTA/STENT INTERVENTION Left 06/25/2018   Procedure: CAROTID PTA/STENT INTERVENTION;  Surgeon: Katha Cabal, MD;  Location: [place] [facility];  Service: Cardiovascular;  Laterality: Left;  . CATARACT EXTRACTION Right 2013  . CORONARY STENT PLACEMENT     L. L. E.  . EYE SURGERY Bilateral    cataract  . LEFT HEART CATH AND CORONARY ANGIOGRAPHY Left 05/06/2018   Procedure: LEFT HEART CATH AND CORONARY ANGIOGRAPHY;  Surgeon: Corey Skains, MD;  Location: [place] [facility];  Service: Cardiovascular;  Laterality: Left;  . LOWER EXTREMITY ANGIOGRAPHY Right 01/22/2018   Procedure: LOWER EXTREMITY ANGIOGRAPHY;  Surgeon: Katha Cabal, MD;  Location: [place] [facility];  Service: Cardiovascular;  Laterality: Right;  . RENAL ANGIOGRAPHY Left 02/26/2018   Procedure: RENAL ANGIOGRAPHY;  Surgeon: Katha Cabal, MD;  Location: [place] [facility];  Service: Cardiovascular;  Laterality: Left;  . STENT PLACEMENT VASCULAR ([facility] HX) Left    stent placement on LLE  . TUBAL LIGATION      Medical History: Past Medical  History:  Diagnosis Date  . Arthritis   . Asthma   . Calf pain   . Cancer [facility]) 2016   left breast  . COPD (chronic obstructive pulmonary disease) ([place])   . Diabetes ([place])   . Discoid lupus   . Gastro-esophageal reflux   . Glaucoma   . Hyperlipemia   . Hypertension   . Iron deficiency anemia   . Joint pain   . Leg swelling   . Osteoarthritis   . PVD (peripheral vascular disease) ([place])   .  Sinus problem   . Sleep apnea    No CPAP/ Can't tolerate  . Stroke [facility]) 1987    Family History: Family History  Problem Relation Age of Onset  . Heart disease Mother   . Diabetes Other   . Hypertension Other   . Diabetes Sister   . Lung cancer Brother   . Leukemia Daughter   . Bladder Cancer Neg Hx   . Kidney disease Neg Hx   . Prostate cancer Neg Hx     Social History   Socioeconomic History  . Marital status: Married    Spouse name: Not on file  . Number of children: Not on file  . Years of education: Not on file  . Highest education level: Not on file  Occupational History  . Not on file  Tobacco Use  . Smoking status: Former Smoker    Quit date: 1987    Years since quitting: 34.2  . Smokeless tobacco: Never Used  . Tobacco comment: quit 31 years  Substance and Sexual Activity  . Alcohol use: No  . Drug use: No  . Sexual activity: Not on file  Other Topics Concern  . Not on file  Social History Narrative  . Not on file   Social Determinants of Health   Financial Resource Strain:   . Difficulty of Paying Living Expenses:   Food Insecurity:   . Worried About Charity fundraiser in the Last Year:   . Arboriculturist in the Last Year:   Transportation Needs:   . Film/video editor (Medical):   Marland Kitchen Lack of Transportation (Non-Medical):   Physical Activity:   . Days of Exercise per Week:   . Minutes of Exercise per Session:   Stress:   . Feeling of Stress :   Social Connections:   . Frequency of Communication with Friends and Family:   . Frequency of  Social Gatherings with Friends and Family:   . Attends Religious Services:   . Active Member of Clubs or Organizations:   . Attends Archivist Meetings:   Marland Kitchen Marital Status:   Intimate Partner Violence:   . Fear of Current or Ex-Partner:   . Emotionally Abused:   Marland Kitchen Physically Abused:   . Sexually Abused:       Review of Systems  Constitutional: Positive for fever. Negative for chills, fatigue and unexpected weight change.  HENT: Negative for congestion, rhinorrhea, sneezing and sore throat.   Eyes: Negative for photophobia, pain and redness.  Respiratory: Negative for cough, chest tightness and shortness of breath.   Cardiovascular: Negative for chest pain and palpitations.  Gastrointestinal: Negative for abdominal pain, constipation, diarrhea, nausea and vomiting.  Endocrine: Negative.   Genitourinary: Negative for dysuria and frequency.  Musculoskeletal: Negative for arthralgias, back pain, joint swelling and neck pain.  Skin: Negative for rash.  Allergic/Immunologic: Negative.   Neurological: Negative for tremors and numbness.  Hematological: Negative for adenopathy. Does not bruise/bleed easily.  Psychiatric/Behavioral: Negative for behavioral problems and sleep disturbance. The patient is not nervous/anxious.     Vital Signs: BP (!) 198/72   Pulse 66   Temp (!) 97.3 F (36.3 C)   Resp 16   Ht 5\' 7"  (1.702 m)   Wt 204 lb (92.5 kg)   SpO2 99%   BMI 31.95 kg/m    Physical Exam Vitals and nursing note reviewed.  Constitutional:      General: She is not in acute distress.  Appearance: She is well-developed. She is not diaphoretic.  HENT:     Head: Normocephalic and atraumatic.     Mouth/Throat:     Pharynx: No oropharyngeal exudate.  Eyes:     Pupils: Pupils are equal, round, and reactive to light.  Neck:     Thyroid: No thyromegaly.     Vascular: No JVD.     Trachea: No tracheal deviation.  Cardiovascular:     Rate and Rhythm: Normal rate and  regular rhythm.     Heart sounds: Normal heart sounds. No murmur. No friction rub. No gallop.   Pulmonary:     Effort: Pulmonary effort is normal. No respiratory distress.     Breath sounds: Normal breath sounds. No wheezing or rales.  Chest:     Chest wall: No tenderness.  Abdominal:     Palpations: Abdomen is soft.     Tenderness: There is no abdominal tenderness. There is no guarding.  Musculoskeletal:        General: Normal range of motion.     Cervical back: Normal range of motion and neck supple.  Lymphadenopathy:     Cervical: No cervical adenopathy.  Skin:    General: Skin is warm and dry.  Neurological:     Mental Status: She is alert and oriented to person, place, and time.     Cranial Nerves: No cranial nerve deficit.  Psychiatric:        Behavior: Behavior normal.        Thought Content: Thought content normal.        Judgment: Judgment normal.     Assessment/Plan: 1. Uncontrolled type 2 diabetes mellitus with hyperglycemia (HCC) A1C is 7.2, we discussed keeping it below 8, and for her to continue her medications as directed. We discussed dietary modification as well.   2. Essential hypertension Take blood pressure medication as directed.   3. Iron deficiency anemia, unspecified iron deficiency anemia type Continue to see hematology as before.   4. Chronic pain of right knee Refilled Celebrex as discussed.  Continue to use, without other NSAIDS. - celecoxib (CELEBREX) 200 MG capsule; Take 1 capsule (200 mg total) by mouth 2 (two) times daily as needed.  Dispense: 180 capsule; Refill: 1  5. Gastroesophageal reflux disease without esophagitis Stable, continue with protonix as discussed.   6. Mixed hyperlipidemia PT is not taking Lipitor, her daughter read about side effects online and decided to stop the medication.  7. Memory deficit Increasing issues with memory.  Encouraged continued compliance with aricept, offered neurology referral.   General  Counseling: sheng pritz understanding of the findings of todays visit and agrees with plan of treatment. I have discussed any further diagnostic evaluation that may be needed or ordered today. We also reviewed her medications today. she has been encouraged to call the office with any questions or concerns that should arise related to todays visit.    No orders of the defined types were placed in this encounter.   Meds ordered this encounter  Medications  . celecoxib (CELEBREX) 200 MG capsule    Sig: Take 1 capsule (200 mg total) by mouth 2 (two) times daily as needed.    Dispense:  180 capsule    Refill:  1    Time spent: 30 Minutes   This patient was seen by Orson Gear AGNP-C in Collaboration with Dr [name] as a part of collaborative care agreement     Kendell Bane AGNP-C Internal medicine

## 2019-04-06 DIAGNOSIS — I129 Hypertensive chronic kidney disease with stage 1 through stage 4 chronic kidney disease, or unspecified chronic kidney disease: Secondary | ICD-10-CM | POA: Diagnosis not present

## 2019-04-06 DIAGNOSIS — Z853 Personal history of malignant neoplasm of breast: Secondary | ICD-10-CM | POA: Diagnosis not present

## 2019-04-06 DIAGNOSIS — C50512 Malignant neoplasm of lower-outer quadrant of left female breast: Secondary | ICD-10-CM | POA: Diagnosis not present

## 2019-04-06 DIAGNOSIS — N181 Chronic kidney disease, stage 1: Secondary | ICD-10-CM | POA: Diagnosis not present

## 2019-04-06 DIAGNOSIS — R922 Inconclusive mammogram: Secondary | ICD-10-CM | POA: Diagnosis not present

## 2019-04-06 DIAGNOSIS — M5134 Other intervertebral disc degeneration, thoracic region: Secondary | ICD-10-CM | POA: Diagnosis not present

## 2019-04-06 DIAGNOSIS — Z170 Estrogen receptor positive status [ER+]: Secondary | ICD-10-CM | POA: Diagnosis not present

## 2019-04-06 DIAGNOSIS — C50812 Malignant neoplasm of overlapping sites of left female breast: Secondary | ICD-10-CM | POA: Diagnosis not present

## 2019-04-06 DIAGNOSIS — E1122 Type 2 diabetes mellitus with diabetic chronic kidney disease: Secondary | ICD-10-CM | POA: Diagnosis not present

## 2019-04-06 DIAGNOSIS — M5431 Sciatica, right side: Secondary | ICD-10-CM | POA: Diagnosis not present

## 2019-04-06 DIAGNOSIS — D509 Iron deficiency anemia, unspecified: Secondary | ICD-10-CM | POA: Diagnosis not present

## 2019-04-06 DIAGNOSIS — D630 Anemia in neoplastic disease: Secondary | ICD-10-CM | POA: Diagnosis not present

## 2019-04-06 DIAGNOSIS — M545 Low back pain: Secondary | ICD-10-CM | POA: Diagnosis not present

## 2019-04-08 ENCOUNTER — Other Ambulatory Visit: Payer: Self-pay

## 2019-04-08 ENCOUNTER — Encounter: Payer: Self-pay | Admitting: Adult Health

## 2019-04-08 ENCOUNTER — Ambulatory Visit (INDEPENDENT_AMBULATORY_CARE_PROVIDER_SITE_OTHER): Payer: Medicare HMO | Admitting: Adult Health

## 2019-04-08 VITALS — BP 160/64 | HR 58 | Temp 97.5°F | Resp 16 | Ht 67.0 in | Wt 204.4 lb

## 2019-04-08 DIAGNOSIS — I10 Essential (primary) hypertension: Secondary | ICD-10-CM | POA: Diagnosis not present

## 2019-04-08 DIAGNOSIS — G8929 Other chronic pain: Secondary | ICD-10-CM

## 2019-04-08 DIAGNOSIS — E782 Mixed hyperlipidemia: Secondary | ICD-10-CM

## 2019-04-08 DIAGNOSIS — E1165 Type 2 diabetes mellitus with hyperglycemia: Secondary | ICD-10-CM

## 2019-04-08 DIAGNOSIS — K219 Gastro-esophageal reflux disease without esophagitis: Secondary | ICD-10-CM

## 2019-04-08 DIAGNOSIS — M25561 Pain in right knee: Secondary | ICD-10-CM

## 2019-04-08 MED ORDER — FENOFIBRATE 54 MG PO TABS: 54.0000 mg | ORAL_TABLET | Freq: Every day | ORAL | 1 refills | Status: DC

## 2019-04-08 NOTE — Progress Notes (Signed)
[facility] [place], [place] [address]  Internal MEDICINE  Office Visit Note  Patient Name: Jennifer Carrillo  [id number]  [id number]  Date of Service: 04/08/2019  Chief Complaint  Patient presents with  . Follow-up    review labs and meds  . Diabetes  . Hyperlipidemia  . Hypertension  . Gastroesophageal Reflux    HPI  PT is here for follow up on DM, HTN and HLD. Her daughter is here in exam room.  Generally she is doing well.  Her Last A1C was 7.2, she denies any issues at this time with her blood sugar. Her bp is slightly elevated today, on recheck was 160/64.  Denies Chest pain, Shortness of breath, palpitations, headache, or blurred vision. We discussed her HLD on labs, and will be starting fenofibrate as she is statin intolerant. Her gerd is also controlled. She complaints of chronic right knee pain, she is not interested in knee replacement which is what orthopedics as told her she needs.    Current Medication: Outpatient Encounter Medications as of 04/08/2019  Medication Sig Note  . ACCU-CHEK FASTCLIX LANCETS MISC by Does not apply route 3 (three) times daily after meals.   Marland Kitchen acetaminophen (TYLENOL) 500 MG tablet Take 1,000 mg by mouth every 6 (six) hours as needed for moderate pain or headache.   Marland Kitchen amLODipine (NORVASC) 10 MG tablet Take 1 tablet (10 mg total) by mouth daily.   Marland Kitchen anastrozole (ARIMIDEX) 1 MG tablet Take 1 mg by mouth every evening.    Marland Kitchen aspirin EC 81 MG EC tablet Take 1 tablet (81 mg total) by mouth daily.   . calcium carbonate (OSCAL) 1500 (600 Ca) MG TABS tablet Take 600 mg by mouth 4 (four) times a week.    . cholecalciferol (VITAMIN D-400) 10 MCG (400 UNIT) TABS tablet Take 400 Units by mouth 3 (three) times a week.   . clopidogrel (PLAVIX) 75 MG tablet TAKE 1 TABLET BY MOUTH  DAILY WITH SUPPER   . CRANBERRY PO Take 4,200 mg by mouth 4 (four) times a week.   . digoxin (LANOXIN) 0.125 MG tablet Take 1 tablet (0.125 mg total) by  mouth daily.   Marland Kitchen docusate sodium (COLACE) 100 MG capsule Take 200 mg by mouth at bedtime as needed for mild constipation.    Marland Kitchen donepezil (ARICEPT) 10 MG tablet TAKE 1 TABLET BY MOUTH AT  BEDTIME FOR MEMORY   . escitalopram (LEXAPRO) 10 MG tablet Take 1 tablet (10 mg total) by mouth at bedtime.   . ferrous sulfate 325 (65 FE) MG EC tablet Take 325 mg by mouth 4 (four) times a week.    Marland Kitchen FLUZONE HIGH-DOSE QUADRIVALENT 0.7 ML SUSY    . glucose blood (ACCU-CHEK SMARTVIEW) test strip 3 each by Other route 3 (three) times daily after meals. Use as instructed   . hydrALAZINE (APRESOLINE) 25 MG tablet Take 1 tablet (25 mg total) by mouth 2 (two) times daily.   . hydrochlorothiazide (HYDRODIURIL) 12.5 MG tablet Take 12.5 mg by mouth daily.   Marland Kitchen ibandronate (BONIVA) 150 MG tablet Take 150 mg by mouth every 30 (thirty) days. Take in the morning with a full glass of water, on an empty stomach, and do not take anything else by mouth or lie down for the next 30 min.   . insulin NPH-regular Human (70-30) 100 UNIT/ML injection Inject 30 Units into the skin 2 (two) times daily. 06/25/2018: Took 15 U this AM 0815  . isosorbide  mononitrate (IMDUR) 30 MG 24 hr tablet Take 1 tablet (30 mg total) by mouth daily.   Marland Kitchen lidocaine (XYLOCAINE) 2 % solution Apply very small amount to affected area QID prn pain   . magnesium oxide (MAG-OX) 400 (241.3 Mg) MG tablet Take 1 tablet (400 mg total) by mouth daily.   . montelukast (SINGULAIR) 10 MG tablet Take 1 tablet (10 mg total) by mouth at bedtime. (Patient taking differently: Take 10 mg by mouth daily as needed (allergies.). )   . pantoprazole (PROTONIX) 40 MG tablet TAKE 1 TABLET BY MOUTH  DAILY   . PROAIR HFA 108 (90 Base) MCG/ACT inhaler Inhale 2 puffs into the lungs every 6 (six) hours as needed for wheezing or shortness of breath.    Marland Kitchen rOPINIRole (REQUIP) 0.5 MG tablet TAKE 1 TABLET BY MOUTH AT  BEDTIME   . telmisartan (MICARDIS) 80 MG tablet Take 1 tablet (80 mg total) by  mouth daily.   . vitamin C (ASCORBIC ACID) 500 MG tablet Take by mouth daily.    . [DISCONTINUED] celecoxib (CELEBREX) 200 MG capsule Take 1 capsule (200 mg total) by mouth 2 (two) times daily as needed.   . fenofibrate 54 MG tablet Take 1 tablet (54 mg total) by mouth daily.   . [DISCONTINUED] atorvastatin (LIPITOR) 10 MG tablet Take 1 tablet (10 mg total) by mouth daily with supper. (Patient taking differently: Take 10 mg by mouth daily at 3 pm. )    No facility-administered encounter medications on file as of 04/08/2019.    Surgical History: Past Surgical History:  Procedure Laterality Date  . BREAST SURGERY Left    left lumpectomy  . CAROTID ANGIOGRAPHY Left 06/25/2018   Procedure: CAROTID ANGIOGRAPHY, possible intervention;  Surgeon: Katha Cabal, MD;  Location: [place] [facility];  Service: Cardiovascular;  Laterality: Left;  . CAROTID ENDARTERECTOMY Right   . CAROTID PTA/STENT INTERVENTION Left 06/25/2018   Procedure: CAROTID PTA/STENT INTERVENTION;  Surgeon: Katha Cabal, MD;  Location: [place] [facility];  Service: Cardiovascular;  Laterality: Left;  . CATARACT EXTRACTION Right 2013  . CORONARY STENT PLACEMENT     L. L. E.  . EYE SURGERY Bilateral    cataract  . LEFT HEART CATH AND CORONARY ANGIOGRAPHY Left 05/06/2018   Procedure: LEFT HEART CATH AND CORONARY ANGIOGRAPHY;  Surgeon: Corey Skains, MD;  Location: [place] [facility];  Service: Cardiovascular;  Laterality: Left;  . LOWER EXTREMITY ANGIOGRAPHY Right 01/22/2018   Procedure: LOWER EXTREMITY ANGIOGRAPHY;  Surgeon: Katha Cabal, MD;  Location: [place] [facility];  Service: Cardiovascular;  Laterality: Right;  . RENAL ANGIOGRAPHY Left 02/26/2018   Procedure: RENAL ANGIOGRAPHY;  Surgeon: Katha Cabal, MD;  Location: [place] [facility];  Service: Cardiovascular;  Laterality: Left;  . STENT PLACEMENT VASCULAR ([facility] HX) Left    stent placement on LLE  . TUBAL LIGATION      Medical  History: Past Medical History:  Diagnosis Date  . Arthritis   . Asthma   . Calf pain   . Cancer [facility]) 2016   left breast  . COPD (chronic obstructive pulmonary disease) ([place])   . Diabetes ([place])   . Discoid lupus   . Gastro-esophageal reflux   . Glaucoma   . Hyperlipemia   . Hypertension   . Iron deficiency anemia   . Joint pain   . Leg swelling   . Osteoarthritis   . PVD (peripheral vascular disease) ([place])   . Sinus problem   .  Sleep apnea    No CPAP/ Can't tolerate  . Stroke [facility]) 1987    Family History: Family History  Problem Relation Age of Onset  . Heart disease Mother   . Diabetes Other   . Hypertension Other   . Diabetes Sister   . Lung cancer Brother   . Leukemia Daughter   . Bladder Cancer Neg Hx   . Kidney disease Neg Hx   . Prostate cancer Neg Hx     Social History   Socioeconomic History  . Marital status: Married    Spouse name: Not on file  . Number of children: Not on file  . Years of education: Not on file  . Highest education level: Not on file  Occupational History  . Not on file  Tobacco Use  . Smoking status: Former Smoker    Quit date: 1987    Years since quitting: 34.2  . Smokeless tobacco: Never Used  . Tobacco comment: quit 31 years  Substance and Sexual Activity  . Alcohol use: No  . Drug use: No  . Sexual activity: Not on file  Other Topics Concern  . Not on file  Social History Narrative  . Not on file   Social Determinants of Health   Financial Resource Strain:   . Difficulty of Paying Living Expenses:   Food Insecurity:   . Worried About Charity fundraiser in the Last Year:   . Arboriculturist in the Last Year:   Transportation Needs:   . Film/video editor (Medical):   Marland Kitchen Lack of Transportation (Non-Medical):   Physical Activity:   . Days of Exercise per Week:   . Minutes of Exercise per Session:   Stress:   . Feeling of Stress :   Social Connections:   . Frequency of Communication with Friends and  Family:   . Frequency of Social Gatherings with Friends and Family:   . Attends Religious Services:   . Active Member of Clubs or Organizations:   . Attends Archivist Meetings:   Marland Kitchen Marital Status:   Intimate Partner Violence:   . Fear of Current or Ex-Partner:   . Emotionally Abused:   Marland Kitchen Physically Abused:   . Sexually Abused:       Review of Systems  Constitutional: Negative for chills, fatigue and unexpected weight change.  HENT: Negative for congestion, rhinorrhea, sneezing and sore throat.   Eyes: Negative for photophobia, pain and redness.  Respiratory: Negative for cough, chest tightness and shortness of breath.   Cardiovascular: Negative for chest pain and palpitations.  Gastrointestinal: Negative for abdominal pain, constipation, diarrhea, nausea and vomiting.  Endocrine: Negative.   Genitourinary: Negative for dysuria and frequency.  Musculoskeletal: Negative for arthralgias, back pain, joint swelling and neck pain.  Skin: Negative for rash.  Allergic/Immunologic: Negative.   Neurological: Negative for tremors and numbness.  Hematological: Negative for adenopathy. Does not bruise/bleed easily.  Psychiatric/Behavioral: Negative for behavioral problems and sleep disturbance. The patient is not nervous/anxious.     Vital Signs: BP (!) 187/60   Pulse (!) 58   Temp (!) 97.5 F (36.4 C)   Resp 16   Ht 5\' 7"  (1.702 m)   Wt 204 lb 6.4 oz (92.7 kg)   SpO2 98%   BMI 32.01 kg/m    Physical Exam Vitals and nursing note reviewed.  Constitutional:      General: She is not in acute distress.    Appearance: She is well-developed. She  is not diaphoretic.  HENT:     Head: Normocephalic and atraumatic.     Mouth/Throat:     Pharynx: No oropharyngeal exudate.  Eyes:     Pupils: Pupils are equal, round, and reactive to light.  Neck:     Thyroid: No thyromegaly.     Vascular: No JVD.     Trachea: No tracheal deviation.  Cardiovascular:     Rate and Rhythm:  Normal rate and regular rhythm.     Heart sounds: Normal heart sounds. No murmur. No friction rub. No gallop.   Pulmonary:     Effort: Pulmonary effort is normal. No respiratory distress.     Breath sounds: Normal breath sounds. No wheezing or rales.  Chest:     Chest wall: No tenderness.  Abdominal:     Palpations: Abdomen is soft.     Tenderness: There is no abdominal tenderness. There is no guarding.  Musculoskeletal:        General: Normal range of motion.     Cervical back: Normal range of motion and neck supple.  Lymphadenopathy:     Cervical: No cervical adenopathy.  Skin:    General: Skin is warm and dry.  Neurological:     Mental Status: She is alert and oriented to person, place, and time.     Cranial Nerves: No cranial nerve deficit.  Psychiatric:        Behavior: Behavior normal.        Thought Content: Thought content normal.        Judgment: Judgment normal.    Assessment/Plan: 1. Chronic pain of right knee Continue current management.   2. Uncontrolled type 2 diabetes mellitus with hyperglycemia ([place]) Once again discussed dietary modifications to help improve blood sugar.  Discussed medication compliance.    3. Mixed hyperlipidemia Start fenofibrate as discussed.   4. Essential hypertension Elevated, discussed the importance of medication compliance.  Pt is unsure if she took her medicine this morning.   5. Gastroesophageal reflux disease without esophagitis *Controlled, continue current meds.   General Counseling: anaika santillano understanding of the findings of todays visit and agrees with plan of treatment. I have discussed any further diagnostic evaluation that may be needed or ordered today. We also reviewed her medications today. she has been encouraged to call the office with any questions or concerns that should arise related to todays visit.    No orders of the defined types were placed in this encounter.   Meds ordered this encounter   Medications  . fenofibrate 54 MG tablet    Sig: Take 1 tablet (54 mg total) by mouth daily.    Dispense:  30 tablet    Refill:  1    Time spent: 30 Minutes   This patient was seen by Orson Gear AGNP-C in Collaboration with Dr [name] as a part of collaborative care agreement     Kendell Bane AGNP-C Internal medicine

## 2019-04-13 ENCOUNTER — Ambulatory Visit: Payer: Medicare HMO | Admitting: Adult Health

## 2019-04-15 ENCOUNTER — Other Ambulatory Visit: Payer: Self-pay

## 2019-05-04 ENCOUNTER — Other Ambulatory Visit: Payer: Self-pay | Admitting: Adult Health

## 2019-05-04 DIAGNOSIS — I10 Essential (primary) hypertension: Secondary | ICD-10-CM

## 2019-05-27 ENCOUNTER — Other Ambulatory Visit: Payer: Self-pay

## 2019-05-27 DIAGNOSIS — I70203 Unspecified atherosclerosis of native arteries of extremities, bilateral legs: Secondary | ICD-10-CM

## 2019-05-27 DIAGNOSIS — I10 Essential (primary) hypertension: Secondary | ICD-10-CM

## 2019-05-27 MED ORDER — HYDRALAZINE HCL 25 MG PO TABS: 25.0000 mg | ORAL_TABLET | Freq: Two times a day (BID) | ORAL | 0 refills | Status: DC

## 2019-05-27 MED ORDER — DONEPEZIL HCL 10 MG PO TABS: ORAL_TABLET | ORAL | 0 refills | Status: DC

## 2019-05-27 MED ORDER — CLOPIDOGREL BISULFATE 75 MG PO TABS: ORAL_TABLET | ORAL | 0 refills | Status: DC

## 2019-05-27 MED ORDER — FENOFIBRATE 54 MG PO TABS: 54.0000 mg | ORAL_TABLET | Freq: Every day | ORAL | 1 refills | Status: DC

## 2019-05-28 ENCOUNTER — Other Ambulatory Visit: Payer: Self-pay | Admitting: Internal Medicine

## 2019-05-28 ENCOUNTER — Other Ambulatory Visit: Payer: Self-pay

## 2019-05-28 DIAGNOSIS — I10 Essential (primary) hypertension: Secondary | ICD-10-CM

## 2019-05-28 DIAGNOSIS — I70203 Unspecified atherosclerosis of native arteries of extremities, bilateral legs: Secondary | ICD-10-CM

## 2019-05-28 MED ORDER — CLOPIDOGREL BISULFATE 75 MG PO TABS: ORAL_TABLET | ORAL | 0 refills | Status: DC

## 2019-05-28 MED ORDER — DONEPEZIL HCL 10 MG PO TABS: ORAL_TABLET | ORAL | 0 refills | Status: DC

## 2019-05-28 MED ORDER — HYDRALAZINE HCL 25 MG PO TABS: 25.0000 mg | ORAL_TABLET | Freq: Two times a day (BID) | ORAL | 0 refills | Status: DC

## 2019-05-29 ENCOUNTER — Other Ambulatory Visit: Payer: Self-pay

## 2019-05-29 DIAGNOSIS — I482 Chronic atrial fibrillation, unspecified: Secondary | ICD-10-CM

## 2019-05-29 MED ORDER — DIGOXIN 125 MCG PO TABS: 0.1250 mg | ORAL_TABLET | Freq: Every day | ORAL | 0 refills | Status: DC

## 2019-06-01 ENCOUNTER — Telehealth: Payer: Self-pay

## 2019-06-01 NOTE — Telephone Encounter (Signed)
Confirmed appointment on 06/03/2019 and screened for covid. klh

## 2019-06-03 ENCOUNTER — Other Ambulatory Visit: Payer: Self-pay

## 2019-06-03 ENCOUNTER — Ambulatory Visit (INDEPENDENT_AMBULATORY_CARE_PROVIDER_SITE_OTHER): Payer: Medicare HMO | Admitting: Adult Health

## 2019-06-03 ENCOUNTER — Encounter: Payer: Self-pay | Admitting: Adult Health

## 2019-06-03 VITALS — BP 160/64 | HR 60 | Temp 96.9°F | Resp 16 | Ht 67.0 in | Wt 201.0 lb

## 2019-06-03 DIAGNOSIS — E782 Mixed hyperlipidemia: Secondary | ICD-10-CM

## 2019-06-03 DIAGNOSIS — D509 Iron deficiency anemia, unspecified: Secondary | ICD-10-CM | POA: Diagnosis not present

## 2019-06-03 DIAGNOSIS — E1165 Type 2 diabetes mellitus with hyperglycemia: Secondary | ICD-10-CM | POA: Diagnosis not present

## 2019-06-03 DIAGNOSIS — R413 Other amnesia: Secondary | ICD-10-CM | POA: Diagnosis not present

## 2019-06-03 DIAGNOSIS — K219 Gastro-esophageal reflux disease without esophagitis: Secondary | ICD-10-CM

## 2019-06-03 DIAGNOSIS — I10 Essential (primary) hypertension: Secondary | ICD-10-CM | POA: Diagnosis not present

## 2019-06-03 LAB — POCT GLYCOSYLATED HEMOGLOBIN (HGB A1C): Hemoglobin A1C: 7.5 % — AB (ref 4.0–5.6)

## 2019-06-03 NOTE — Progress Notes (Signed)
[facility] [place], [place] [address]  Internal MEDICINE  Office Visit Note  Patient Name: Jennifer Carrillo  [id number]  [id number]  Date of Service: 06/03/2019  Chief Complaint  Patient presents with  . Follow-up    runny nose,go over meds  . Hypertension  . Hyperlipidemia  . Diabetes  . Gastroesophageal Reflux    HPI  Pt is here for follow up on HTN, HLD, DM and GERD.  Overall she is doing well.  Her blood pressure is elevated initially.  She Denies Chest pain, Shortness of breath, palpitations, headache, or blurred vision.  Her blood sugars have been variable. Her A1C is 7.5 today.  She is taking her medications. Her GERD is controlled with Protonix. She does complain of chronic runny nose. She is intermittently taking some OTC meds, without much relief.       Current Medication: Outpatient Encounter Medications as of 06/03/2019  Medication Sig Note  . ACCU-CHEK FASTCLIX LANCETS MISC by Does not apply route 3 (three) times daily after meals.   Marland Kitchen acetaminophen (TYLENOL) 500 MG tablet Take 1,000 mg by mouth every 6 (six) hours as needed for moderate pain or headache.   Marland Kitchen amLODipine (NORVASC) 10 MG tablet Take 1 tablet (10 mg total) by mouth daily.   Marland Kitchen anastrozole (ARIMIDEX) 1 MG tablet Take 1 mg by mouth every evening.    Marland Kitchen aspirin EC 81 MG EC tablet Take 1 tablet (81 mg total) by mouth daily.   . calcium carbonate (OSCAL) 1500 (600 Ca) MG TABS tablet Take 600 mg by mouth 4 (four) times a week.    . cholecalciferol (VITAMIN D-400) 10 MCG (400 UNIT) TABS tablet Take 400 Units by mouth 3 (three) times a week.   . clopidogrel (PLAVIX) 75 MG tablet TAKE 1 TABLET BY MOUTH  DAILY WITH SUPPER   . CRANBERRY PO Take 4,200 mg by mouth 4 (four) times a week.   . digoxin (LANOXIN) 0.125 MG tablet Take 1 tablet (0.125 mg total) by mouth daily.   Marland Kitchen docusate sodium (COLACE) 100 MG capsule Take 200 mg by mouth at bedtime as needed for mild constipation.    Marland Kitchen donepezil  (ARICEPT) 10 MG tablet TAKE ONE TAB BY MOUTH AT BEDTIME FOR MEMORY   . fenofibrate 54 MG tablet Take 1 tablet (54 mg total) by mouth daily.   . ferrous sulfate 325 (65 FE) MG EC tablet Take 325 mg by mouth 4 (four) times a week.    Marland Kitchen FLUZONE HIGH-DOSE QUADRIVALENT 0.7 ML SUSY    . glucose blood (ACCU-CHEK SMARTVIEW) test strip 3 each by Other route 3 (three) times daily after meals. Use as instructed   . hydrALAZINE (APRESOLINE) 25 MG tablet Take 1 tablet (25 mg total) by mouth 2 (two) times daily.   . insulin NPH-regular Human (70-30) 100 UNIT/ML injection Inject 30 Units into the skin 2 (two) times daily. 06/25/2018: Took 15 U this AM 0815  . isosorbide mononitrate (IMDUR) 30 MG 24 hr tablet Take 1 tablet (30 mg total) by mouth daily.   . magnesium oxide (MAG-OX) 400 (241.3 Mg) MG tablet Take 1 tablet (400 mg total) by mouth daily.   . montelukast (SINGULAIR) 10 MG tablet Take 1 tablet (10 mg total) by mouth at bedtime. (Patient taking differently: Take 10 mg by mouth daily as needed (allergies.). )   . pantoprazole (PROTONIX) 40 MG tablet TAKE 1 TABLET BY MOUTH  DAILY   . PROAIR HFA 108 (90  Base) MCG/ACT inhaler Inhale 2 puffs into the lungs every 6 (six) hours as needed for wheezing or shortness of breath.    . telmisartan (MICARDIS) 80 MG tablet TAKE 1 TABLET EVERY DAY   . vitamin C (ASCORBIC ACID) 500 MG tablet Take by mouth daily.     No facility-administered encounter medications on file as of 06/03/2019.    Surgical History: Past Surgical History:  Procedure Laterality Date  . BREAST SURGERY Left    left lumpectomy  . CAROTID ANGIOGRAPHY Left 06/25/2018   Procedure: CAROTID ANGIOGRAPHY, possible intervention;  Surgeon: Katha Cabal, MD;  Location: [place] [facility];  Service: Cardiovascular;  Laterality: Left;  . CAROTID ENDARTERECTOMY Right   . CAROTID PTA/STENT INTERVENTION Left 06/25/2018   Procedure: CAROTID PTA/STENT INTERVENTION;  Surgeon: Katha Cabal, MD;   Location: [place] [facility];  Service: Cardiovascular;  Laterality: Left;  . CATARACT EXTRACTION Right 2013  . CORONARY STENT PLACEMENT     L. L. E.  . EYE SURGERY Bilateral    cataract  . LEFT HEART CATH AND CORONARY ANGIOGRAPHY Left 05/06/2018   Procedure: LEFT HEART CATH AND CORONARY ANGIOGRAPHY;  Surgeon: Corey Skains, MD;  Location: [place] [facility];  Service: Cardiovascular;  Laterality: Left;  . LOWER EXTREMITY ANGIOGRAPHY Right 01/22/2018   Procedure: LOWER EXTREMITY ANGIOGRAPHY;  Surgeon: Katha Cabal, MD;  Location: [place] [facility];  Service: Cardiovascular;  Laterality: Right;  . RENAL ANGIOGRAPHY Left 02/26/2018   Procedure: RENAL ANGIOGRAPHY;  Surgeon: Katha Cabal, MD;  Location: [place] [facility];  Service: Cardiovascular;  Laterality: Left;  . STENT PLACEMENT VASCULAR ([facility] HX) Left    stent placement on LLE  . TUBAL LIGATION      Medical History: Past Medical History:  Diagnosis Date  . Arthritis   . Asthma   . Calf pain   . Cancer [facility]) 2016   left breast  . COPD (chronic obstructive pulmonary disease) ([place])   . Diabetes ([place])   . Discoid lupus   . Gastro-esophageal reflux   . Glaucoma   . Hyperlipemia   . Hypertension   . Iron deficiency anemia   . Joint pain   . Leg swelling   . Osteoarthritis   . PVD (peripheral vascular disease) ([place])   . Sinus problem   . Sleep apnea    No CPAP/ Can't tolerate  . Stroke [facility]) 1987    Family History: Family History  Problem Relation Age of Onset  . Heart disease Mother   . Diabetes Other   . Hypertension Other   . Diabetes Sister   . Lung cancer Brother   . Leukemia Daughter   . Bladder Cancer Neg Hx   . Kidney disease Neg Hx   . Prostate cancer Neg Hx     Social History   Socioeconomic History  . Marital status: Married    Spouse name: Not on file  . Number of children: Not on file  . Years of education: Not on file  . Highest education level: Not on file   Occupational History  . Not on file  Tobacco Use  . Smoking status: Former Smoker    Quit date: 1987    Years since quitting: 34.4  . Smokeless tobacco: Never Used  . Tobacco comment: quit 31 years  Substance and Sexual Activity  . Alcohol use: No  . Drug use: No  . Sexual activity: Not on file  Other Topics Concern  .  Not on file  Social History Narrative  . Not on file   Social Determinants of Health   Financial Resource Strain:   . Difficulty of Paying Living Expenses:   Food Insecurity:   . Worried About Charity fundraiser in the Last Year:   . Arboriculturist in the Last Year:   Transportation Needs:   . Film/video editor (Medical):   Marland Kitchen Lack of Transportation (Non-Medical):   Physical Activity:   . Days of Exercise per Week:   . Minutes of Exercise per Session:   Stress:   . Feeling of Stress :   Social Connections:   . Frequency of Communication with Friends and Family:   . Frequency of Social Gatherings with Friends and Family:   . Attends Religious Services:   . Active Member of Clubs or Organizations:   . Attends Archivist Meetings:   Marland Kitchen Marital Status:   Intimate Partner Violence:   . Fear of Current or Ex-Partner:   . Emotionally Abused:   Marland Kitchen Physically Abused:   . Sexually Abused:       Review of Systems  Constitutional: Negative for chills, fatigue and unexpected weight change.  HENT: Negative for congestion, rhinorrhea, sneezing and sore throat.   Eyes: Negative for photophobia, pain and redness.  Respiratory: Negative for cough, chest tightness and shortness of breath.   Cardiovascular: Negative for chest pain and palpitations.  Gastrointestinal: Negative for abdominal pain, constipation, diarrhea, nausea and vomiting.  Endocrine: Negative.   Genitourinary: Negative for dysuria and frequency.  Musculoskeletal: Negative for arthralgias, back pain, joint swelling and neck pain.  Skin: Negative for rash.  Allergic/Immunologic:  Negative.   Neurological: Negative for tremors and numbness.  Hematological: Negative for adenopathy. Does not bruise/bleed easily.  Psychiatric/Behavioral: Negative for behavioral problems and sleep disturbance. The patient is not nervous/anxious.     Vital Signs: BP (!) 182/67   Pulse 60   Temp (!) 96.9 F (36.1 C)   Resp 16   Ht 5\' 7"  (1.702 m)   Wt 201 lb (91.2 kg)   SpO2 97%   BMI 31.48 kg/m    Physical Exam Vitals and nursing note reviewed.  Constitutional:      General: She is not in acute distress.    Appearance: She is well-developed. She is not diaphoretic.  HENT:     Head: Normocephalic and atraumatic.     Mouth/Throat:     Pharynx: No oropharyngeal exudate.  Eyes:     Pupils: Pupils are equal, round, and reactive to light.  Neck:     Thyroid: No thyromegaly.     Vascular: No JVD.     Trachea: No tracheal deviation.  Cardiovascular:     Rate and Rhythm: Normal rate and regular rhythm.     Heart sounds: Normal heart sounds. No murmur. No friction rub. No gallop.   Pulmonary:     Effort: Pulmonary effort is normal. No respiratory distress.     Breath sounds: Normal breath sounds. No wheezing or rales.  Chest:     Chest wall: No tenderness.  Abdominal:     Palpations: Abdomen is soft.     Tenderness: There is no abdominal tenderness. There is no guarding.  Musculoskeletal:        General: Normal range of motion.     Cervical back: Normal range of motion and neck supple.  Lymphadenopathy:     Cervical: No cervical adenopathy.  Skin:    General:  Skin is warm and dry.  Neurological:     Mental Status: She is alert and oriented to person, place, and time.     Cranial Nerves: No cranial nerve deficit.  Psychiatric:        Behavior: Behavior normal.        Thought Content: Thought content normal.        Judgment: Judgment normal.    Assessment/Plan: 1. Uncontrolled type 2 diabetes mellitus with hyperglycemia (HCC) A1C slightly increased to 7.5. Patient  will continue lifestyle changes, and encouraged her husband/daughter to monitor medication administration closely as patient is forgetful of taking medications.  - POCT HgB A1C  2. Iron deficiency anemia, unspecified iron deficiency anemia type Recheck labs and follow up when results available.  - CBC w/Diff/Platelet - Fe+TIBC+Fer - B12 and Folate Panel - Vitamin D (25 hydroxy)  3. Essential hypertension Slightly elevated, continue current medications.  4. Mixed hyperlipidemia Recheck Lipid panel.  5. Memory deficit Family reports increased memory deficit. Discussed Neurology eval, family would like to wait currently.    6. Gastroesophageal reflux disease without esophagitis No recent complaints. Continue as before.   General Counseling: keely drennan understanding of the findings of todays visit and agrees with plan of treatment. I have discussed any further diagnostic evaluation that may be needed or ordered today. We also reviewed her medications today. she has been encouraged to call the office with any questions or concerns that should arise related to todays visit.    Orders Placed This Encounter  Procedures  . POCT HgB A1C    No orders of the defined types were placed in this encounter.   Time spent:30 Minutes   This patient was seen by Orson Gear AGNP-C in Collaboration with Dr [name] as a part of collaborative care agreement     Kendell Bane AGNP-C Internal medicine

## 2019-06-09 ENCOUNTER — Other Ambulatory Visit: Payer: Self-pay

## 2019-06-09 DIAGNOSIS — I482 Chronic atrial fibrillation, unspecified: Secondary | ICD-10-CM

## 2019-06-09 DIAGNOSIS — I10 Essential (primary) hypertension: Secondary | ICD-10-CM

## 2019-06-09 MED ORDER — HYDRALAZINE HCL 25 MG PO TABS: 25.0000 mg | ORAL_TABLET | Freq: Two times a day (BID) | ORAL | 1 refills | Status: DC

## 2019-06-09 MED ORDER — ISOSORBIDE MONONITRATE ER 30 MG PO TB24: 30.0000 mg | ORAL_TABLET | Freq: Every day | ORAL | 1 refills | Status: DC

## 2019-06-09 MED ORDER — AMLODIPINE BESYLATE 10 MG PO TABS: 10.0000 mg | ORAL_TABLET | Freq: Every day | ORAL | 0 refills | Status: DC

## 2019-06-09 MED ORDER — DIGOXIN 125 MCG PO TABS: 0.1250 mg | ORAL_TABLET | Freq: Every day | ORAL | 1 refills | Status: DC

## 2019-06-10 ENCOUNTER — Other Ambulatory Visit: Payer: Self-pay | Admitting: Adult Health

## 2019-06-10 ENCOUNTER — Other Ambulatory Visit: Payer: Self-pay

## 2019-06-10 DIAGNOSIS — I10 Essential (primary) hypertension: Secondary | ICD-10-CM

## 2019-06-10 MED ORDER — ACCU-CHEK SOFTCLIX LANCETS MISC: 1 refills | Status: DC

## 2019-06-10 MED ORDER — BD SWAB SINGLE USE REGULAR PADS: MEDICATED_PAD | 1 refills | Status: DC

## 2019-06-10 MED ORDER — ACCU-CHEK GUIDE VI STRP: ORAL_STRIP | 1 refills | Status: DC

## 2019-06-10 MED ORDER — ACCU-CHEK GUIDE ME W/DEVICE KIT: PACK | 0 refills | Status: DC

## 2019-06-15 ENCOUNTER — Other Ambulatory Visit: Payer: Self-pay

## 2019-06-15 ENCOUNTER — Telehealth: Payer: Self-pay

## 2019-06-15 MED ORDER — BD SWAB SINGLE USE REGULAR PADS: MEDICATED_PAD | 1 refills | Status: DC

## 2019-06-15 NOTE — Telephone Encounter (Signed)
Pt daughter called that isosorbide she suppose to take 60 mg we have 30 mg advised her contact cardiology and she what dose they like her to take and let them send new pres

## 2019-06-18 ENCOUNTER — Other Ambulatory Visit: Payer: Self-pay

## 2019-06-18 DIAGNOSIS — I10 Essential (primary) hypertension: Secondary | ICD-10-CM

## 2019-06-18 MED ORDER — ACCU-CHEK SOFTCLIX LANCETS MISC: 3 refills | Status: DC

## 2019-06-18 MED ORDER — ACCU-CHEK GUIDE ME W/DEVICE KIT: PACK | 0 refills | Status: AC

## 2019-06-18 MED ORDER — ACCU-CHEK GUIDE VI STRP: ORAL_STRIP | 3 refills | Status: DC

## 2019-06-18 MED ORDER — AMLODIPINE BESYLATE 10 MG PO TABS: 10.0000 mg | ORAL_TABLET | Freq: Every day | ORAL | 0 refills | Status: DC

## 2019-06-19 ENCOUNTER — Other Ambulatory Visit: Payer: Self-pay

## 2019-06-19 DIAGNOSIS — I10 Essential (primary) hypertension: Secondary | ICD-10-CM

## 2019-06-19 MED ORDER — AMLODIPINE BESYLATE 10 MG PO TABS: 10.0000 mg | ORAL_TABLET | Freq: Every day | ORAL | 0 refills | Status: DC

## 2019-06-22 ENCOUNTER — Other Ambulatory Visit: Payer: Self-pay

## 2019-06-22 DIAGNOSIS — I10 Essential (primary) hypertension: Secondary | ICD-10-CM

## 2019-06-22 MED ORDER — AMLODIPINE BESYLATE 10 MG PO TABS: 10.0000 mg | ORAL_TABLET | Freq: Every day | ORAL | 1 refills | Status: DC

## 2019-06-22 MED ORDER — BD SWAB SINGLE USE REGULAR PADS: MEDICATED_PAD | 1 refills | Status: AC

## 2019-06-29 ENCOUNTER — Other Ambulatory Visit: Payer: Self-pay

## 2019-06-29 DIAGNOSIS — I10 Essential (primary) hypertension: Secondary | ICD-10-CM

## 2019-06-29 MED ORDER — AMLODIPINE BESYLATE 10 MG PO TABS: 10.0000 mg | ORAL_TABLET | Freq: Every day | ORAL | 1 refills | Status: DC

## 2019-07-10 ENCOUNTER — Telehealth: Payer: Self-pay

## 2019-07-10 NOTE — Telephone Encounter (Signed)
Confirmed appointment on 07/15/2019 and screened for covid. klh 

## 2019-07-11 ENCOUNTER — Other Ambulatory Visit: Payer: Self-pay | Admitting: Adult Health

## 2019-07-11 DIAGNOSIS — I10 Essential (primary) hypertension: Secondary | ICD-10-CM

## 2019-07-15 ENCOUNTER — Encounter: Payer: Self-pay | Admitting: Adult Health

## 2019-07-15 ENCOUNTER — Other Ambulatory Visit: Payer: Self-pay

## 2019-07-15 ENCOUNTER — Ambulatory Visit (INDEPENDENT_AMBULATORY_CARE_PROVIDER_SITE_OTHER): Payer: Medicare HMO | Admitting: Adult Health

## 2019-07-15 VITALS — BP 142/57 | HR 68 | Temp 97.6°F | Resp 16 | Ht 67.0 in | Wt 205.0 lb

## 2019-07-15 DIAGNOSIS — E1165 Type 2 diabetes mellitus with hyperglycemia: Secondary | ICD-10-CM | POA: Diagnosis not present

## 2019-07-15 DIAGNOSIS — R413 Other amnesia: Secondary | ICD-10-CM

## 2019-07-15 DIAGNOSIS — E782 Mixed hyperlipidemia: Secondary | ICD-10-CM | POA: Diagnosis not present

## 2019-07-15 DIAGNOSIS — I10 Essential (primary) hypertension: Secondary | ICD-10-CM | POA: Diagnosis not present

## 2019-07-15 DIAGNOSIS — D509 Iron deficiency anemia, unspecified: Secondary | ICD-10-CM

## 2019-07-15 DIAGNOSIS — K219 Gastro-esophageal reflux disease without esophagitis: Secondary | ICD-10-CM | POA: Diagnosis not present

## 2019-07-15 DIAGNOSIS — D510 Vitamin B12 deficiency anemia due to intrinsic factor deficiency: Secondary | ICD-10-CM | POA: Diagnosis not present

## 2019-07-15 DIAGNOSIS — E559 Vitamin D deficiency, unspecified: Secondary | ICD-10-CM | POA: Diagnosis not present

## 2019-07-15 NOTE — Progress Notes (Signed)
[facility] [place], [place] [address]  Internal MEDICINE  Office Visit Note  Patient Name: Jennifer Carrillo  [id number]  [id number]  Date of Service: 07/15/2019  Chief Complaint  Patient presents with  . Follow-up    had cancerous tumor removed 59yr ago now has an odor under left arm  . Diabetes  . Gastroesophageal Reflux  . Hyperlipidemia  . Hypertension  . Leg Pain    HPI  Pt is here for follow up on DM, GERD, HTN and HLD.  Overall she is at her baseline.  She reports her memory is getting worse. Her daughter is fixing her medications in the pill box for daily consumption.  She states she is taking all her medications as prescribed.  Her GERD is well controlled.  Her blood pressure is at baseline. Denies Chest pain, Shortness of breath, palpitations, headache, or blurred vision. She does continue to report leg pain bilaterally. She states her blood sugars have been "ok" she forgets to take it every day.  She was eating a lot of fruit, but she is cutting back on that.  At last visit labs were ordered, and she has not had them drawn.     Current Medication: Outpatient Encounter Medications as of 07/15/2019  Medication Sig Note  . Accu-Chek Softclix Lancets lancets Use as instructed 3 times a day DX e11.65   . acetaminophen (TYLENOL) 500 MG tablet Take 1,000 mg by mouth every 6 (six) hours as needed for moderate pain or headache.   . Alcohol Swabs (B-D SINGLE USE SWABS REGULAR) PADS Use as directed for 3 times daily DX E11.65   . amLODipine (NORVASC) 10 MG tablet TAKE 1 TABLET BY MOUTH EVERY DAY   . anastrozole (ARIMIDEX) 1 MG tablet Take 1 mg by mouth every evening.    .Marland Kitchenaspirin EC 81 MG EC tablet Take 1 tablet (81 mg total) by mouth daily.   . Blood Glucose Monitoring Suppl (ACCU-CHEK GUIDE ME) w/Device KIT Use as instructed. DX e11.65   . calcium carbonate (OSCAL) 1500 (600 Ca) MG TABS tablet Take 600 mg by mouth 4 (four) times a week.    .  cholecalciferol (VITAMIN D-400) 10 MCG (400 UNIT) TABS tablet Take 400 Units by mouth 3 (three) times a week.   . clopidogrel (PLAVIX) 75 MG tablet TAKE 1 TABLET BY MOUTH  DAILY WITH SUPPER   . CRANBERRY PO Take 4,200 mg by mouth 4 (four) times a week.   . digoxin (LANOXIN) 0.125 MG tablet Take 1 tablet (0.125 mg total) by mouth daily.   .Marland Kitchendocusate sodium (COLACE) 100 MG capsule Take 200 mg by mouth at bedtime as needed for mild constipation.    .Marland Kitchendonepezil (ARICEPT) 10 MG tablet TAKE ONE TAB BY MOUTH AT BEDTIME FOR MEMORY   . fenofibrate 54 MG tablet Take 1 tablet (54 mg total) by mouth daily.   . ferrous sulfate 325 (65 FE) MG EC tablet Take 325 mg by mouth 4 (four) times a week.    .Marland KitchenFLUZONE HIGH-DOSE QUADRIVALENT 0.7 ML SUSY    . glucose blood (ACCU-CHEK GUIDE) test strip Use as instructed three times a daily Dx E11.65   . hydrALAZINE (APRESOLINE) 25 MG tablet Take 1 tablet (25 mg total) by mouth 2 (two) times daily.   . insulin NPH-regular Human (70-30) 100 UNIT/ML injection Inject 30 Units into the skin 2 (two) times daily. 06/25/2018: Took 15 U this AM 0815  . isosorbide mononitrate (IMDUR)  30 MG 24 hr tablet Take 1 tablet (30 mg total) by mouth daily.   . magnesium oxide (MAG-OX) 400 (241.3 Mg) MG tablet TAKE 1 TABLET EVERY DAY   . montelukast (SINGULAIR) 10 MG tablet Take 1 tablet (10 mg total) by mouth at bedtime. (Patient taking differently: Take 10 mg by mouth daily as needed (allergies.). )   . pantoprazole (PROTONIX) 40 MG tablet TAKE 1 TABLET BY MOUTH  DAILY   . PROAIR HFA 108 (90 Base) MCG/ACT inhaler Inhale 2 puffs into the lungs every 6 (six) hours as needed for wheezing or shortness of breath.    . telmisartan (MICARDIS) 80 MG tablet TAKE 1 TABLET EVERY DAY   . vitamin C (ASCORBIC ACID) 500 MG tablet Take by mouth daily.     No facility-administered encounter medications on file as of 07/15/2019.    Surgical History: Past Surgical History:  Procedure Laterality Date  .  BREAST SURGERY Left    left lumpectomy  . CAROTID ANGIOGRAPHY Left 06/25/2018   Procedure: CAROTID ANGIOGRAPHY, possible intervention;  Surgeon: Katha Cabal, MD;  Location: [facility];  Service: Cardiovascular;  Laterality: Left;  . CAROTID ENDARTERECTOMY Right   . CAROTID PTA/STENT INTERVENTION Left 06/25/2018   Procedure: CAROTID PTA/STENT INTERVENTION;  Surgeon: Katha Cabal, MD;  Location: [facility];  Service: Cardiovascular;  Laterality: Left;  . CATARACT EXTRACTION Right 2013  . CORONARY STENT PLACEMENT     L. L. E.  . EYE SURGERY Bilateral    cataract  . LEFT HEART CATH AND CORONARY ANGIOGRAPHY Left 05/06/2018   Procedure: LEFT HEART CATH AND CORONARY ANGIOGRAPHY;  Surgeon: Corey Skains, MD;  Location: [facility];  Service: Cardiovascular;  Laterality: Left;  . LOWER EXTREMITY ANGIOGRAPHY Right 01/22/2018   Procedure: LOWER EXTREMITY ANGIOGRAPHY;  Surgeon: Katha Cabal, MD;  Location: [facility];  Service: Cardiovascular;  Laterality: Right;  . RENAL ANGIOGRAPHY Left 02/26/2018   Procedure: RENAL ANGIOGRAPHY;  Surgeon: Katha Cabal, MD;  Location: [facility];  Service: Cardiovascular;  Laterality: Left;  . STENT PLACEMENT VASCULAR ([facility] HX) Left    stent placement on LLE  . TUBAL LIGATION      Medical History: Past Medical History:  Diagnosis Date  . Arthritis   . Asthma   . Calf pain   . Cancer [facility]) 2016   left breast  . COPD (chronic obstructive pulmonary disease) ([place])   . Diabetes ([place])   . Discoid lupus   . Gastro-esophageal reflux   . Glaucoma   . Hyperlipemia   . Hypertension   . Iron deficiency anemia   . Joint pain   . Leg swelling   . Osteoarthritis   . PVD (peripheral vascular disease) ([place])   . Sinus problem   . Sleep apnea    No CPAP/ Can't tolerate  . Stroke [facility]) 1987    Family History: Family History  Problem Relation Age of Onset  . Heart disease Mother   .  Diabetes Other   . Hypertension Other   . Diabetes Sister   . Lung cancer Brother   . Leukemia Daughter   . Bladder Cancer Neg Hx   . Kidney disease Neg Hx   . Prostate cancer Neg Hx     Social History   Socioeconomic History  . Marital status: Married    Spouse name: Not on file  . Number of children: Not on file  . Years of  education: Not on file  . Highest education level: Not on file  Occupational History  . Not on file  Tobacco Use  . Smoking status: Former Smoker    Quit date: 1987    Years since quitting: 34.5  . Smokeless tobacco: Never Used  . Tobacco comment: quit 31 years  Vaping Use  . Vaping Use: Never used  Substance and Sexual Activity  . Alcohol use: No  . Drug use: No  . Sexual activity: Not on file  Other Topics Concern  . Not on file  Social History Narrative  . Not on file   Social Determinants of Health   Financial Resource Strain:   . Difficulty of Paying Living Expenses:   Food Insecurity:   . Worried About Charity fundraiser in the Last Year:   . Arboriculturist in the Last Year:   Transportation Needs:   . Film/video editor (Medical):   Marland Kitchen Lack of Transportation (Non-Medical):   Physical Activity:   . Days of Exercise per Week:   . Minutes of Exercise per Session:   Stress:   . Feeling of Stress :   Social Connections:   . Frequency of Communication with Friends and Family:   . Frequency of Social Gatherings with Friends and Family:   . Attends Religious Services:   . Active Member of Clubs or Organizations:   . Attends Archivist Meetings:   Marland Kitchen Marital Status:   Intimate Partner Violence:   . Fear of Current or Ex-Partner:   . Emotionally Abused:   Marland Kitchen Physically Abused:   . Sexually Abused:       Review of Systems  Constitutional: Negative for chills, fatigue and unexpected weight change.  HENT: Negative for congestion, rhinorrhea, sneezing and sore throat.   Eyes: Negative for photophobia, pain and redness.   Respiratory: Negative for cough, chest tightness and shortness of breath.   Cardiovascular: Negative for chest pain and palpitations.  Gastrointestinal: Negative for abdominal pain, constipation, diarrhea, nausea and vomiting.  Endocrine: Negative.   Genitourinary: Negative for dysuria and frequency.  Musculoskeletal: Negative for arthralgias, back pain, joint swelling and neck pain.  Skin: Negative for rash.  Allergic/Immunologic: Negative.   Neurological: Negative for tremors and numbness.  Hematological: Negative for adenopathy. Does not bruise/bleed easily.  Psychiatric/Behavioral: Negative for behavioral problems and sleep disturbance. The patient is not nervous/anxious.     Vital Signs: BP (!) 168/57   Pulse 68   Temp 97.6 F (36.4 C)   Resp 16   Ht '5\' 7"'  (1.702 m)   Wt 205 lb (93 kg)   SpO2 96%   BMI 32.11 kg/m    Physical Exam Vitals and nursing note reviewed.  Constitutional:      General: She is not in acute distress.    Appearance: She is well-developed. She is not diaphoretic.  HENT:     Head: Normocephalic and atraumatic.     Mouth/Throat:     Pharynx: No oropharyngeal exudate.  Eyes:     Pupils: Pupils are equal, round, and reactive to light.  Neck:     Thyroid: No thyromegaly.     Vascular: No JVD.     Trachea: No tracheal deviation.  Cardiovascular:     Rate and Rhythm: Normal rate and regular rhythm.     Heart sounds: Normal heart sounds. No murmur heard.  No friction rub. No gallop.   Pulmonary:     Effort: Pulmonary effort is normal.  No respiratory distress.     Breath sounds: Normal breath sounds. No wheezing or rales.  Chest:     Chest wall: No tenderness.  Abdominal:     Palpations: Abdomen is soft.     Tenderness: There is no abdominal tenderness. There is no guarding.  Musculoskeletal:        General: Normal range of motion.     Cervical back: Normal range of motion and neck supple.  Lymphadenopathy:     Cervical: No cervical  adenopathy.  Skin:    General: Skin is warm and dry.  Neurological:     Mental Status: She is alert and oriented to person, place, and time.     Cranial Nerves: No cranial nerve deficit.  Psychiatric:        Behavior: Behavior normal.        Thought Content: Thought content normal.        Judgment: Judgment normal.    Assessment/Plan: 1. Uncontrolled type 2 diabetes mellitus with hyperglycemia ([place]) Continue to check blood glucose at home.  Will recheck A1C at future visit.   2. Essential hypertension Stable, continue current medications.   3. Mixed hyperlipidemia Controlled, continue to monitor.  4. Memory deficit Continued issues, continue medication  5. Gastroesophageal reflux disease without esophagitis Stable, continue present management.   6. Iron deficiency anemia, unspecified iron deficiency anemia type Patient needs repeat labs, encouraged her and her husband to have labs drawn before next appt  General Counseling: Desaree verbalizes understanding of the findings of todays visit and agrees with plan of treatment. I have discussed any further diagnostic evaluation that may be needed or ordered today. We also reviewed her medications today. she has been encouraged to call the office with any questions or concerns that should arise related to todays visit.    No orders of the defined types were placed in this encounter.   No orders of the defined types were placed in this encounter.   Time spent: 30 Minutes   This patient was seen by Orson Gear AGNP-C in Collaboration with Dr [name] as a part of collaborative care agreement     Kendell Bane AGNP-C Internal medicine

## 2019-07-16 LAB — IRON,TIBC AND FERRITIN PANEL
Ferritin: 172 ng/mL — ABNORMAL HIGH (ref 15–150)
Iron Saturation: 12 % — ABNORMAL LOW (ref 15–55)
Iron: 39 ug/dL (ref 27–139)
Total Iron Binding Capacity: 314 ug/dL (ref 250–450)
UIBC: 275 ug/dL (ref 118–369)

## 2019-07-16 LAB — CBC WITH DIFFERENTIAL/PLATELET
Basophils Absolute: 0 10*3/uL (ref 0.0–0.2)
Basos: 1 %
EOS (ABSOLUTE): 0.1 10*3/uL (ref 0.0–0.4)
Eos: 1 %
Hematocrit: 33 % — ABNORMAL LOW (ref 34.0–46.6)
Hemoglobin: 10.1 g/dL — ABNORMAL LOW (ref 11.1–15.9)
Immature Grans (Abs): 0.1 10*3/uL (ref 0.0–0.1)
Immature Granulocytes: 1 %
Lymphocytes Absolute: 2.2 10*3/uL (ref 0.7–3.1)
Lymphs: 29 %
MCH: 25.5 pg — ABNORMAL LOW (ref 26.6–33.0)
MCHC: 30.6 g/dL — ABNORMAL LOW (ref 31.5–35.7)
MCV: 83 fL (ref 79–97)
Monocytes Absolute: 0.6 10*3/uL (ref 0.1–0.9)
Monocytes: 8 %
Neutrophils Absolute: 4.8 10*3/uL (ref 1.4–7.0)
Neutrophils: 60 %
Platelets: 341 10*3/uL (ref 150–450)
RBC: 3.96 x10E6/uL (ref 3.77–5.28)
RDW: 13.9 % (ref 11.7–15.4)
WBC: 7.8 10*3/uL (ref 3.4–10.8)

## 2019-07-16 LAB — VITAMIN D 25 HYDROXY (VIT D DEFICIENCY, FRACTURES): Vit D, 25-Hydroxy: 12.2 ng/mL — ABNORMAL LOW (ref 30.0–100.0)

## 2019-07-16 LAB — B12 AND FOLATE PANEL
Folate: 8.8 ng/mL (ref >3.0)
Vitamin B-12: 710 pg/mL (ref 232–1245)

## 2019-07-24 ENCOUNTER — Other Ambulatory Visit: Payer: Self-pay | Admitting: Adult Health

## 2019-07-24 DIAGNOSIS — I70203 Unspecified atherosclerosis of native arteries of extremities, bilateral legs: Secondary | ICD-10-CM

## 2019-07-31 ENCOUNTER — Other Ambulatory Visit: Payer: Self-pay | Admitting: Internal Medicine

## 2019-07-31 ENCOUNTER — Telehealth: Payer: Self-pay

## 2019-07-31 DIAGNOSIS — E1165 Type 2 diabetes mellitus with hyperglycemia: Secondary | ICD-10-CM

## 2019-07-31 NOTE — Telephone Encounter (Signed)
Yes that will be fine, an order is done

## 2019-08-03 ENCOUNTER — Other Ambulatory Visit: Payer: Self-pay | Admitting: Adult Health

## 2019-08-03 DIAGNOSIS — I10 Essential (primary) hypertension: Secondary | ICD-10-CM

## 2019-08-12 ENCOUNTER — Ambulatory Visit: Payer: Medicare HMO | Admitting: Adult Health

## 2019-08-13 ENCOUNTER — Other Ambulatory Visit: Payer: Self-pay | Admitting: Adult Health

## 2019-08-17 ENCOUNTER — Other Ambulatory Visit: Payer: Self-pay

## 2019-08-20 ENCOUNTER — Telehealth: Payer: Self-pay

## 2019-08-20 NOTE — Telephone Encounter (Signed)
Disability parking placard ready for pickup at front desk. Patient advised.

## 2019-08-21 ENCOUNTER — Other Ambulatory Visit: Payer: Self-pay | Admitting: Adult Health

## 2019-08-24 DIAGNOSIS — H40013 Open angle with borderline findings, low risk, bilateral: Secondary | ICD-10-CM | POA: Diagnosis not present

## 2019-08-24 DIAGNOSIS — H0288B Meibomian gland dysfunction left eye, upper and lower eyelids: Secondary | ICD-10-CM | POA: Diagnosis not present

## 2019-08-24 DIAGNOSIS — H0102B Squamous blepharitis left eye, upper and lower eyelids: Secondary | ICD-10-CM | POA: Diagnosis not present

## 2019-08-24 DIAGNOSIS — H0102A Squamous blepharitis right eye, upper and lower eyelids: Secondary | ICD-10-CM | POA: Diagnosis not present

## 2019-08-24 DIAGNOSIS — H16143 Punctate keratitis, bilateral: Secondary | ICD-10-CM | POA: Diagnosis not present

## 2019-08-24 DIAGNOSIS — H5213 Myopia, bilateral: Secondary | ICD-10-CM | POA: Diagnosis not present

## 2019-08-24 DIAGNOSIS — Z961 Presence of intraocular lens: Secondary | ICD-10-CM | POA: Diagnosis not present

## 2019-08-24 DIAGNOSIS — G453 Amaurosis fugax: Secondary | ICD-10-CM | POA: Diagnosis not present

## 2019-08-24 DIAGNOSIS — H0288A Meibomian gland dysfunction right eye, upper and lower eyelids: Secondary | ICD-10-CM | POA: Diagnosis not present

## 2019-08-25 ENCOUNTER — Other Ambulatory Visit: Payer: Self-pay

## 2019-08-25 ENCOUNTER — Telehealth: Payer: Self-pay

## 2019-08-25 DIAGNOSIS — I70203 Unspecified atherosclerosis of native arteries of extremities, bilateral legs: Secondary | ICD-10-CM

## 2019-08-25 MED ORDER — CLOPIDOGREL BISULFATE 75 MG PO TABS: ORAL_TABLET | ORAL | 3 refills | Status: DC

## 2019-08-25 NOTE — Telephone Encounter (Signed)
Please make app to see dfk

## 2019-09-01 ENCOUNTER — Ambulatory Visit: Payer: Medicare HMO | Admitting: Adult Health

## 2019-09-01 ENCOUNTER — Ambulatory Visit: Payer: Medicare HMO | Admitting: Hospice and Palliative Medicine

## 2019-09-03 ENCOUNTER — Telehealth: Payer: Self-pay

## 2019-09-03 NOTE — Telephone Encounter (Signed)
LMOM for office visit on 8/31

## 2019-09-08 ENCOUNTER — Other Ambulatory Visit: Payer: Self-pay

## 2019-09-08 ENCOUNTER — Ambulatory Visit (INDEPENDENT_AMBULATORY_CARE_PROVIDER_SITE_OTHER): Payer: Medicare HMO | Admitting: Hospice and Palliative Medicine

## 2019-09-08 ENCOUNTER — Encounter: Payer: Self-pay | Admitting: Hospice and Palliative Medicine

## 2019-09-08 DIAGNOSIS — E1165 Type 2 diabetes mellitus with hyperglycemia: Secondary | ICD-10-CM

## 2019-09-08 DIAGNOSIS — E559 Vitamin D deficiency, unspecified: Secondary | ICD-10-CM | POA: Diagnosis not present

## 2019-09-08 DIAGNOSIS — I10 Essential (primary) hypertension: Secondary | ICD-10-CM | POA: Diagnosis not present

## 2019-09-08 DIAGNOSIS — G8929 Other chronic pain: Secondary | ICD-10-CM | POA: Diagnosis not present

## 2019-09-08 DIAGNOSIS — R413 Other amnesia: Secondary | ICD-10-CM | POA: Diagnosis not present

## 2019-09-08 DIAGNOSIS — D509 Iron deficiency anemia, unspecified: Secondary | ICD-10-CM

## 2019-09-08 DIAGNOSIS — I776 Arteritis, unspecified: Secondary | ICD-10-CM

## 2019-09-08 DIAGNOSIS — R0989 Other specified symptoms and signs involving the circulatory and respiratory systems: Secondary | ICD-10-CM | POA: Diagnosis not present

## 2019-09-08 LAB — POCT GLYCOSYLATED HEMOGLOBIN (HGB A1C): Hemoglobin A1C: 7.6 % — AB (ref 4.0–5.6)

## 2019-09-08 MED ORDER — VITAMIN D (CHOLECALCIFEROL) 25 MCG (1000 UT) PO CAPS: 1000.0000 [IU] | ORAL_CAPSULE | ORAL | 0 refills | Status: DC

## 2019-09-08 NOTE — Progress Notes (Signed)
[facility] [place], [place] [address]  Internal MEDICINE  Office Visit Note  Patient Name: Jennifer Carrillo  [id number]  [id number]  Date of Service: 09/11/2019  Chief Complaint  Patient presents with  . Follow-up    sore right arm, both knees hurt, runny nose all the time, right temple jumps and it hurts   . Diabetes  . Hyperlipidemia  . Hypertension  . Sleep Apnea  . Quality Metric Gaps    TDAP    HPI Patient is here for routine follow-up. 1. Right temple pain-Continues to have pain in her right temple. About 1 month prior had an episode where she lost her vision in bilateral eyes. She is also having issues with her new glasses, not able to see out of them. Was seen by Ophthalmology earlier this month. 2. Diabetes-She has been trying to eat better. Still has trouble remembering to take her blood sugar levels at home.  3. Right arm pain/knee pain/back pain-Some days pain is worse that others. Several nights her knee and back pain are preventing her from sleeping. She tosses and turns in the bed trying to find a comfortable position. Tylenol is no longer helping to ease her pain. She is tired of hurting all of the time. 4. Memory loss-She feels her memory continues to decline. She is relying more and more on her family to help with day to day activities. Requested that I call her granddaughter and/or daughter after out visit to inform them of what went on at today's visit. She says she just cannot remember and will be unable to tell them what we discussed.    Current Medication: Outpatient Encounter Medications as of 09/08/2019  Medication Sig Note  . Accu-Chek Softclix Lancets lancets Use as instructed 3 times a day DX e11.65   . acetaminophen (TYLENOL) 500 MG tablet Take 1,000 mg by mouth every 6 (six) hours as needed for moderate pain or headache.   . Alcohol Swabs (B-D SINGLE USE SWABS REGULAR) PADS Use as directed for 3 times daily DX E11.65   .  amLODipine (NORVASC) 10 MG tablet TAKE 1 TABLET BY MOUTH EVERY DAY   . anastrozole (ARIMIDEX) 1 MG tablet Take 1 mg by mouth every evening.    Marland Kitchen aspirin EC 81 MG EC tablet Take 1 tablet (81 mg total) by mouth daily.   . Blood Glucose Monitoring Suppl (ACCU-CHEK GUIDE ME) w/Device KIT Use as instructed. DX e11.65   . calcium carbonate (OSCAL) 1500 (600 Ca) MG TABS tablet Take 600 mg by mouth 4 (four) times a week.    . clopidogrel (PLAVIX) 75 MG tablet Take 1 tablet by mouth daily with supper   . CRANBERRY PO Take 4,200 mg by mouth 4 (four) times a week.   . digoxin (LANOXIN) 0.125 MG tablet Take 1 tablet (0.125 mg total) by mouth daily.   Marland Kitchen docusate sodium (COLACE) 100 MG capsule Take 200 mg by mouth at bedtime as needed for mild constipation.    Marland Kitchen donepezil (ARICEPT) 10 MG tablet TAKE ONE TAB BY MOUTH AT BEDTIME FOR MEMORY   . fenofibrate 54 MG tablet TAKE 1 TABLET EVERY DAY   . ferrous sulfate 325 (65 FE) MG EC tablet Take 325 mg by mouth 4 (four) times a week.    Marland Kitchen FLUZONE HIGH-DOSE QUADRIVALENT 0.7 ML SUSY    . hydrALAZINE (APRESOLINE) 25 MG tablet Take 1 tablet (25 mg total) by mouth 2 (two) times daily.   Marland Kitchen  insulin NPH-regular Human (70-30) 100 UNIT/ML injection Inject 30 Units into the skin 2 (two) times daily. 06/25/2018: Took 15 U this AM 0815  . isosorbide mononitrate (IMDUR) 30 MG 24 hr tablet Take 1 tablet (30 mg total) by mouth daily.   . magnesium oxide (MAG-OX) 400 (241.3 Mg) MG tablet TAKE 1 TABLET EVERY DAY   . pantoprazole (PROTONIX) 40 MG tablet TAKE 1 TABLET BY MOUTH  DAILY   . PROAIR HFA 108 (90 Base) MCG/ACT inhaler Inhale 2 puffs into the lungs every 6 (six) hours as needed for wheezing or shortness of breath.    . telmisartan (MICARDIS) 80 MG tablet TAKE 1 TABLET EVERY DAY   . vitamin C (ASCORBIC ACID) 500 MG tablet Take by mouth daily.    . [DISCONTINUED] cholecalciferol (VITAMIN D-400) 10 MCG (400 UNIT) TABS tablet Take 400 Units by mouth 3 (three) times a week.   .  [DISCONTINUED] glucose blood (ACCU-CHEK GUIDE) test strip Use as instructed three times a daily Dx E11.65   . Vitamin D, Cholecalciferol, 25 MCG (1000 UT) CAPS Take 1,000 Units by mouth every Monday, Wednesday, and Friday.    No facility-administered encounter medications on file as of 09/08/2019.    Surgical History: Past Surgical History:  Procedure Laterality Date  . BREAST SURGERY Left    left lumpectomy  . CAROTID ANGIOGRAPHY Left 06/25/2018   Procedure: CAROTID ANGIOGRAPHY, possible intervention;  Surgeon: Katha Cabal, MD;  Location: [facility];  Service: Cardiovascular;  Laterality: Left;  . CAROTID ENDARTERECTOMY Right   . CAROTID PTA/STENT INTERVENTION Left 06/25/2018   Procedure: CAROTID PTA/STENT INTERVENTION;  Surgeon: Katha Cabal, MD;  Location: [facility];  Service: Cardiovascular;  Laterality: Left;  . CATARACT EXTRACTION Right 2013  . CORONARY STENT PLACEMENT     L. L. E.  . EYE SURGERY Bilateral    cataract  . LEFT HEART CATH AND CORONARY ANGIOGRAPHY Left 05/06/2018   Procedure: LEFT HEART CATH AND CORONARY ANGIOGRAPHY;  Surgeon: Corey Skains, MD;  Location: [facility];  Service: Cardiovascular;  Laterality: Left;  . LOWER EXTREMITY ANGIOGRAPHY Right 01/22/2018   Procedure: LOWER EXTREMITY ANGIOGRAPHY;  Surgeon: Katha Cabal, MD;  Location: [facility];  Service: Cardiovascular;  Laterality: Right;  . RENAL ANGIOGRAPHY Left 02/26/2018   Procedure: RENAL ANGIOGRAPHY;  Surgeon: Katha Cabal, MD;  Location: [facility];  Service: Cardiovascular;  Laterality: Left;  . STENT PLACEMENT VASCULAR ([facility] HX) Left    stent placement on LLE  . TUBAL LIGATION      Medical History: Past Medical History:  Diagnosis Date  . Arthritis   . Asthma   . Calf pain   . Cancer [facility]) 2016   left breast  . COPD (chronic obstructive pulmonary disease) ([place])   . Diabetes ([place])   . Discoid lupus   .  Gastro-esophageal reflux   . Glaucoma   . Hyperlipemia   . Hypertension   . Iron deficiency anemia   . Joint pain   . Leg swelling   . Osteoarthritis   . PVD (peripheral vascular disease) ([place])   . Sinus problem   . Sleep apnea    No CPAP/ Can't tolerate  . Stroke [facility]) 1987    Family History: Family History  Problem Relation Age of Onset  . Heart disease Mother   . Diabetes Other   . Hypertension Other   . Diabetes Sister   . Lung cancer Brother   .  Leukemia Daughter   . Bladder Cancer Neg Hx   . Kidney disease Neg Hx   . Prostate cancer Neg Hx     Social History   Socioeconomic History  . Marital status: Married    Spouse name: Not on file  . Number of children: Not on file  . Years of education: Not on file  . Highest education level: Not on file  Occupational History  . Not on file  Tobacco Use  . Smoking status: Former Smoker    Quit date: 1987    Years since quitting: 34.6  . Smokeless tobacco: Never Used  . Tobacco comment: quit 31 years  Vaping Use  . Vaping Use: Never used  Substance and Sexual Activity  . Alcohol use: No  . Drug use: No  . Sexual activity: Not on file  Other Topics Concern  . Not on file  Social History Narrative  . Not on file   Social Determinants of Health   Financial Resource Strain:   . Difficulty of Paying Living Expenses: Not on file  Food Insecurity:   . Worried About Charity fundraiser in the Last Year: Not on file  . Ran Out of Food in the Last Year: Not on file  Transportation Needs:   . Lack of Transportation (Medical): Not on file  . Lack of Transportation (Non-Medical): Not on file  Physical Activity:   . Days of Exercise per Week: Not on file  . Minutes of Exercise per Session: Not on file  Stress:   . Feeling of Stress : Not on file  Social Connections:   . Frequency of Communication with Friends and Family: Not on file  . Frequency of Social Gatherings with Friends and Family: Not on file  . Attends  Religious Services: Not on file  . Active Member of Clubs or Organizations: Not on file  . Attends Archivist Meetings: Not on file  . Marital Status: Not on file  Intimate Partner Violence:   . Fear of Current or Ex-Partner: Not on file  . Emotionally Abused: Not on file  . Physically Abused: Not on file  . Sexually Abused: Not on file      Review of Systems  Constitutional: Positive for fatigue. Negative for appetite change, chills and diaphoresis.  HENT: Positive for rhinorrhea. Negative for congestion, postnasal drip, sinus pressure, sinus pain, sore throat, trouble swallowing and voice change.        Right temple pain  Eyes: Positive for visual disturbance. Negative for photophobia, discharge, redness and itching.  Respiratory: Negative for cough, shortness of breath and wheezing.   Cardiovascular: Negative for chest pain, palpitations and leg swelling.  Gastrointestinal: Negative for abdominal pain, constipation, diarrhea, nausea and vomiting.  Genitourinary: Negative for dysuria and flank pain.  Musculoskeletal: Positive for arthralgias, back pain and myalgias. Negative for gait problem and neck pain.       Bilateral knee pain, right wrist, elbow pain  Skin: Negative for color change.  Allergic/Immunologic: Negative for environmental allergies and food allergies.  Neurological: Positive for weakness. Negative for dizziness, tremors, speech difficulty, numbness and headaches.  Hematological: Does not bruise/bleed easily.  Psychiatric/Behavioral: Positive for sleep disturbance. Negative for agitation, behavioral problems (depression) and hallucinations.       Memory loss    Vital Signs: BP (!) 142/62   Pulse 64   Temp 98.5 F (36.9 C)   Resp 16   Ht _0  (1.702 m)   Wt 204  lb 9.6 oz (92.8 kg)   SpO2 97%   BMI 32.04 kg/m    Physical Exam Constitutional:      Appearance: Normal appearance.  HENT:     Mouth/Throat:     Mouth: Mucous membranes are moist.      Pharynx: Oropharynx is clear.  Cardiovascular:     Rate and Rhythm: Normal rate and regular rhythm.     Pulses: Normal pulses.          Carotid pulses are on the right side with bruit and on the left side with bruit.    Heart sounds: Normal heart sounds.  Pulmonary:     Effort: Pulmonary effort is normal.     Breath sounds: Normal breath sounds.  Abdominal:     General: Abdomen is flat.     Palpations: Abdomen is soft.  Musculoskeletal:        General: Normal range of motion.     Cervical back: Normal range of motion.     Comments: Tenderness to right wrist  Skin:    General: Skin is warm.  Neurological:     General: No focal deficit present.     Mental Status: She is alert and oriented to person, place, and time. Mental status is at baseline.  Psychiatric:        Mood and Affect: Mood normal.        Behavior: Behavior normal.        Thought Content: Thought content normal.     Assessment/Plan: 1. Uncontrolled type 2 diabetes mellitus with hyperglycemia (HCC) A1C today 7.6, stable over last several months. Will continue with current therapy and continue to monitor A1C levels. - POCT HgB A1C  2. Essential hypertension Elevated BP today, was improved on manual recheck. Initial HR low, better on recheck. Will monitor both BP and HR. If continues to have low HR may need to consider adjusting digoxin and/or imdur therapy. Will consult with cardiology for further management if HR and BP continue to be issue.  3. Iron deficiency anemia, unspecified iron deficiency anemia type History of iron deficiency anemia, currently taking iron supplements 4 times a week. Iron levels as well as hemoglobin remain stable at this time. Has required iron infusions in the past, has not been seen by hematologist in quite some time. Will continue to closely monitor levels, will refer back to hematology if levels warrant further management.  4. Bilateral carotid bruits Will need to assess for  progression or stabilization of vessel disease. Concerning for vessel disease due to episode of loss of vision followed by change in vision as well as right sided temporal pain. Left carotid angiogram and stent placement 2020. - US Carotid Duplex Bilateral; Future  5. Memory deficit Continues to have issues with her memory, she feels it is progressively worsening. Lives with husband and has a daughter and granddaughter that assist with her care. May benefit and need to consider in home care with ADL assistance and medication management assistance.  6. Arteritis ([place]) Right sided temporal artery pain, episode of complete loss of vision followed by visual changes. Sed rate, rule out GCA//may need high dose steroids US carotid//rule out vessel disease, possible TIA, may need further management from vascular surgery - US Carotid Duplex Bilateral; Future - Sedimentation rate  7. Other chronic pain Continues to complain of chronic bilateral knee pain as well as low back pain. Last several weeks she is having right sided wrist, elbow and finger pain. May benefit from low  dose pain medication if pain continues to disrupt sleep. Will need to discuss pain management options with daughter/granddaughter.  8. Vitamin D deficiency Vitamin D level 12.2 at this time, has been taking 400 units three times a week. Discontinue current therapy and begin 1000 units daily for 3 months and recheck levels. Taking calcium supplementation. - Vitamin D, Cholecalciferol, 25 MCG (1000 UT) CAPS; Take 1,000 Units by mouth every Monday, Wednesday, and Friday.  Dispense: 60 capsule; Refill: 0  General Counseling: Arushi verbalizes understanding of the findings of todays visit and agrees with plan of treatment. I have discussed any further diagnostic evaluation that may be needed or ordered today. We also reviewed her medications today. she has been encouraged to call the office with any questions or concerns that should arise  related to todays visit.  Spoke with her granddaughter to get better and more accurate insight to her problems and living home conditions. Advance care planning might be needed   Orders Placed This Encounter  Procedures  . US Carotid Duplex Bilateral  . Sedimentation rate  . POCT HgB A1C    Meds ordered this encounter  Medications  . Vitamin D, Cholecalciferol, 25 MCG (1000 UT) CAPS    Sig: Take 1,000 Units by mouth every Monday, Wednesday, and Friday.    Dispense:  60 capsule    Refill:  0    Time spent: 40 Minutes   This patient was seen by Theodoro Grist AGNP-C in Collaboration with Dr [name] as a part of collaborative care agreement  High and complex medical problems involved critical thinking and decision making.    Tanna Furry Dicy Smigel AGNP-C Internal medicine

## 2019-09-09 ENCOUNTER — Other Ambulatory Visit: Payer: Self-pay | Admitting: Adult Health

## 2019-09-22 ENCOUNTER — Other Ambulatory Visit: Payer: Self-pay | Admitting: Adult Health

## 2019-09-28 ENCOUNTER — Other Ambulatory Visit: Payer: Self-pay | Admitting: Adult Health

## 2019-09-28 DIAGNOSIS — I10 Essential (primary) hypertension: Secondary | ICD-10-CM

## 2019-10-09 ENCOUNTER — Other Ambulatory Visit: Payer: Self-pay | Admitting: Adult Health

## 2019-10-15 ENCOUNTER — Other Ambulatory Visit: Payer: Self-pay | Admitting: Adult Health

## 2019-10-15 DIAGNOSIS — I482 Chronic atrial fibrillation, unspecified: Secondary | ICD-10-CM

## 2019-10-19 DIAGNOSIS — N184 Chronic kidney disease, stage 4 (severe): Secondary | ICD-10-CM | POA: Diagnosis not present

## 2019-10-19 DIAGNOSIS — I10 Essential (primary) hypertension: Secondary | ICD-10-CM | POA: Diagnosis not present

## 2019-10-19 DIAGNOSIS — E782 Mixed hyperlipidemia: Secondary | ICD-10-CM | POA: Insufficient documentation

## 2019-10-19 DIAGNOSIS — E1169 Type 2 diabetes mellitus with other specified complication: Secondary | ICD-10-CM | POA: Diagnosis not present

## 2019-10-19 DIAGNOSIS — I2510 Atherosclerotic heart disease of native coronary artery without angina pectoris: Secondary | ICD-10-CM | POA: Diagnosis not present

## 2019-10-19 DIAGNOSIS — E1122 Type 2 diabetes mellitus with diabetic chronic kidney disease: Secondary | ICD-10-CM | POA: Diagnosis not present

## 2019-10-19 DIAGNOSIS — I739 Peripheral vascular disease, unspecified: Secondary | ICD-10-CM | POA: Diagnosis not present

## 2019-10-19 DIAGNOSIS — Z794 Long term (current) use of insulin: Secondary | ICD-10-CM | POA: Diagnosis not present

## 2019-10-19 DIAGNOSIS — E1142 Type 2 diabetes mellitus with diabetic polyneuropathy: Secondary | ICD-10-CM | POA: Diagnosis not present

## 2019-10-19 DIAGNOSIS — E1159 Type 2 diabetes mellitus with other circulatory complications: Secondary | ICD-10-CM | POA: Diagnosis not present

## 2019-10-19 DIAGNOSIS — E559 Vitamin D deficiency, unspecified: Secondary | ICD-10-CM | POA: Diagnosis not present

## 2019-10-22 ENCOUNTER — Telehealth: Payer: Self-pay

## 2019-10-22 NOTE — Telephone Encounter (Signed)
Patient has refused carotid ultrasound and will follow up with a1c in February. Jennifer Carrillo

## 2019-10-22 NOTE — Telephone Encounter (Signed)
-----   Message from Luiz Ochoa, NP sent at 10/21/2019  7:53 PM EDT ----- Her A1C was 7.6 at last visit--will need further monitoring, would be ok to space them out longer than 3 months if that helps?  I ordered US of carotids bc she complained of intermittent vision loss and temple pain, but it is ultimately up to her. If she continues to refuse I will have it adequately documented. Thanks for the heads up!  ----- Message ----- From: Laurie Panda Sent: 10/21/2019   3:23 PM EDT To: [name], MD, Luiz Ochoa, NP  Pt has canceled carotid and a1c folow up twice doesn't think she needs them

## 2019-10-23 ENCOUNTER — Other Ambulatory Visit: Payer: Self-pay | Admitting: Internal Medicine

## 2019-10-23 ENCOUNTER — Other Ambulatory Visit: Payer: Medicare HMO

## 2019-10-23 DIAGNOSIS — I70203 Unspecified atherosclerosis of native arteries of extremities, bilateral legs: Secondary | ICD-10-CM

## 2019-11-02 DIAGNOSIS — E1122 Type 2 diabetes mellitus with diabetic chronic kidney disease: Secondary | ICD-10-CM | POA: Diagnosis not present

## 2019-11-02 DIAGNOSIS — I2510 Atherosclerotic heart disease of native coronary artery without angina pectoris: Secondary | ICD-10-CM | POA: Diagnosis not present

## 2019-11-02 DIAGNOSIS — E785 Hyperlipidemia, unspecified: Secondary | ICD-10-CM | POA: Diagnosis not present

## 2019-11-02 DIAGNOSIS — E1159 Type 2 diabetes mellitus with other circulatory complications: Secondary | ICD-10-CM | POA: Diagnosis not present

## 2019-11-02 DIAGNOSIS — Z794 Long term (current) use of insulin: Secondary | ICD-10-CM | POA: Diagnosis not present

## 2019-11-02 DIAGNOSIS — I10 Essential (primary) hypertension: Secondary | ICD-10-CM | POA: Diagnosis not present

## 2019-11-02 DIAGNOSIS — E1169 Type 2 diabetes mellitus with other specified complication: Secondary | ICD-10-CM | POA: Diagnosis not present

## 2019-11-02 DIAGNOSIS — E1142 Type 2 diabetes mellitus with diabetic polyneuropathy: Secondary | ICD-10-CM | POA: Diagnosis not present

## 2019-11-02 DIAGNOSIS — N184 Chronic kidney disease, stage 4 (severe): Secondary | ICD-10-CM | POA: Diagnosis not present

## 2019-11-03 ENCOUNTER — Ambulatory Visit: Payer: Medicare HMO | Admitting: Hospice and Palliative Medicine

## 2019-11-04 ENCOUNTER — Other Ambulatory Visit: Payer: Self-pay | Admitting: Hospice and Palliative Medicine

## 2019-11-13 ENCOUNTER — Other Ambulatory Visit: Payer: Medicare HMO

## 2019-11-27 DIAGNOSIS — I10 Essential (primary) hypertension: Secondary | ICD-10-CM | POA: Diagnosis not present

## 2019-11-27 DIAGNOSIS — I25118 Atherosclerotic heart disease of native coronary artery with other forms of angina pectoris: Secondary | ICD-10-CM | POA: Diagnosis not present

## 2019-11-27 DIAGNOSIS — I70219 Atherosclerosis of native arteries of extremities with intermittent claudication, unspecified extremity: Secondary | ICD-10-CM | POA: Diagnosis not present

## 2019-11-27 DIAGNOSIS — R0789 Other chest pain: Secondary | ICD-10-CM | POA: Diagnosis not present

## 2019-11-27 DIAGNOSIS — I119 Hypertensive heart disease without heart failure: Secondary | ICD-10-CM | POA: Diagnosis not present

## 2019-12-09 ENCOUNTER — Other Ambulatory Visit: Payer: Self-pay | Admitting: Adult Health

## 2019-12-09 DIAGNOSIS — I10 Essential (primary) hypertension: Secondary | ICD-10-CM

## 2019-12-16 ENCOUNTER — Other Ambulatory Visit: Payer: Self-pay

## 2019-12-16 DIAGNOSIS — I10 Essential (primary) hypertension: Secondary | ICD-10-CM

## 2019-12-16 MED ORDER — HYDRALAZINE HCL 25 MG PO TABS: 25.0000 mg | ORAL_TABLET | Freq: Two times a day (BID) | ORAL | 0 refills | Status: DC

## 2020-01-04 DIAGNOSIS — E1122 Type 2 diabetes mellitus with diabetic chronic kidney disease: Secondary | ICD-10-CM | POA: Diagnosis not present

## 2020-01-04 DIAGNOSIS — I10 Essential (primary) hypertension: Secondary | ICD-10-CM | POA: Diagnosis not present

## 2020-01-04 DIAGNOSIS — N184 Chronic kidney disease, stage 4 (severe): Secondary | ICD-10-CM | POA: Diagnosis not present

## 2020-01-13 ENCOUNTER — Other Ambulatory Visit (INDEPENDENT_AMBULATORY_CARE_PROVIDER_SITE_OTHER): Payer: Self-pay | Admitting: Nephrology

## 2020-01-13 DIAGNOSIS — I701 Atherosclerosis of renal artery: Secondary | ICD-10-CM | POA: Diagnosis not present

## 2020-01-13 DIAGNOSIS — N184 Chronic kidney disease, stage 4 (severe): Secondary | ICD-10-CM | POA: Diagnosis not present

## 2020-01-13 DIAGNOSIS — I10 Essential (primary) hypertension: Secondary | ICD-10-CM | POA: Diagnosis not present

## 2020-01-13 DIAGNOSIS — E1122 Type 2 diabetes mellitus with diabetic chronic kidney disease: Secondary | ICD-10-CM | POA: Diagnosis not present

## 2020-01-15 ENCOUNTER — Ambulatory Visit (INDEPENDENT_AMBULATORY_CARE_PROVIDER_SITE_OTHER): Payer: Medicare HMO

## 2020-01-15 ENCOUNTER — Other Ambulatory Visit: Payer: Self-pay

## 2020-01-15 DIAGNOSIS — I701 Atherosclerosis of renal artery: Secondary | ICD-10-CM | POA: Diagnosis not present

## 2020-01-21 DIAGNOSIS — I6523 Occlusion and stenosis of bilateral carotid arteries: Secondary | ICD-10-CM | POA: Diagnosis not present

## 2020-01-21 DIAGNOSIS — I25118 Atherosclerotic heart disease of native coronary artery with other forms of angina pectoris: Secondary | ICD-10-CM | POA: Diagnosis not present

## 2020-01-25 ENCOUNTER — Other Ambulatory Visit: Payer: Self-pay | Admitting: Internal Medicine

## 2020-01-28 ENCOUNTER — Other Ambulatory Visit: Payer: Self-pay | Admitting: Internal Medicine

## 2020-01-28 DIAGNOSIS — I25118 Atherosclerotic heart disease of native coronary artery with other forms of angina pectoris: Secondary | ICD-10-CM

## 2020-02-11 ENCOUNTER — Encounter (INDEPENDENT_AMBULATORY_CARE_PROVIDER_SITE_OTHER): Payer: Medicare Other

## 2020-02-11 ENCOUNTER — Encounter (INDEPENDENT_AMBULATORY_CARE_PROVIDER_SITE_OTHER): Payer: Medicare Other | Admitting: Vascular Surgery

## 2020-02-15 ENCOUNTER — Ambulatory Visit: Payer: Medicare HMO | Admitting: Internal Medicine

## 2020-02-17 ENCOUNTER — Other Ambulatory Visit: Payer: Self-pay

## 2020-02-17 MED ORDER — FENOFIBRATE 54 MG PO TABS
54.0000 mg | ORAL_TABLET | Freq: Every day | ORAL | 0 refills | Status: DC
Start: 2020-02-17 — End: 2020-02-18

## 2020-02-18 ENCOUNTER — Other Ambulatory Visit: Payer: Self-pay

## 2020-02-18 DIAGNOSIS — I10 Essential (primary) hypertension: Secondary | ICD-10-CM

## 2020-02-18 MED ORDER — TELMISARTAN 80 MG PO TABS: 80.0000 mg | ORAL_TABLET | Freq: Every day | ORAL | 0 refills | Status: DC

## 2020-02-18 MED ORDER — FENOFIBRATE 54 MG PO TABS: 54.0000 mg | ORAL_TABLET | Freq: Every day | ORAL | 0 refills | Status: DC

## 2020-02-22 ENCOUNTER — Other Ambulatory Visit: Payer: Self-pay | Admitting: Internal Medicine

## 2020-02-22 DIAGNOSIS — I10 Essential (primary) hypertension: Secondary | ICD-10-CM

## 2020-02-25 ENCOUNTER — Other Ambulatory Visit: Payer: Self-pay

## 2020-02-25 MED ORDER — DONEPEZIL HCL 10 MG PO TABS: ORAL_TABLET | ORAL | 0 refills | Status: DC

## 2020-03-02 ENCOUNTER — Other Ambulatory Visit: Payer: Self-pay

## 2020-03-02 MED ORDER — DONEPEZIL HCL 10 MG PO TABS: ORAL_TABLET | ORAL | 0 refills | Status: DC

## 2020-03-22 ENCOUNTER — Other Ambulatory Visit: Payer: Self-pay | Admitting: Internal Medicine

## 2020-03-22 DIAGNOSIS — I70203 Unspecified atherosclerosis of native arteries of extremities, bilateral legs: Secondary | ICD-10-CM

## 2020-03-24 ENCOUNTER — Encounter: Payer: Self-pay | Admitting: Physician Assistant

## 2020-03-24 ENCOUNTER — Ambulatory Visit (INDEPENDENT_AMBULATORY_CARE_PROVIDER_SITE_OTHER): Payer: Medicare HMO | Admitting: Physician Assistant

## 2020-03-24 ENCOUNTER — Other Ambulatory Visit: Payer: Self-pay

## 2020-03-24 DIAGNOSIS — Z1231 Encounter for screening mammogram for malignant neoplasm of breast: Secondary | ICD-10-CM | POA: Diagnosis not present

## 2020-03-24 DIAGNOSIS — Z124 Encounter for screening for malignant neoplasm of cervix: Secondary | ICD-10-CM | POA: Diagnosis not present

## 2020-03-24 DIAGNOSIS — E1165 Type 2 diabetes mellitus with hyperglycemia: Secondary | ICD-10-CM | POA: Diagnosis not present

## 2020-03-24 DIAGNOSIS — R413 Other amnesia: Secondary | ICD-10-CM | POA: Diagnosis not present

## 2020-03-24 DIAGNOSIS — E782 Mixed hyperlipidemia: Secondary | ICD-10-CM

## 2020-03-24 DIAGNOSIS — R300 Dysuria: Secondary | ICD-10-CM

## 2020-03-24 DIAGNOSIS — I700 Atherosclerosis of aorta: Secondary | ICD-10-CM

## 2020-03-24 DIAGNOSIS — I701 Atherosclerosis of renal artery: Secondary | ICD-10-CM

## 2020-03-24 DIAGNOSIS — I482 Chronic atrial fibrillation, unspecified: Secondary | ICD-10-CM

## 2020-03-24 DIAGNOSIS — I70219 Atherosclerosis of native arteries of extremities with intermittent claudication, unspecified extremity: Secondary | ICD-10-CM | POA: Diagnosis not present

## 2020-03-24 DIAGNOSIS — E559 Vitamin D deficiency, unspecified: Secondary | ICD-10-CM

## 2020-03-24 DIAGNOSIS — Z0001 Encounter for general adult medical examination with abnormal findings: Secondary | ICD-10-CM | POA: Diagnosis not present

## 2020-03-24 DIAGNOSIS — I10 Essential (primary) hypertension: Secondary | ICD-10-CM | POA: Diagnosis not present

## 2020-03-24 NOTE — Progress Notes (Signed)
[facility] [place], [place] [address]  Internal MEDICINE  Office Visit Note  Patient Name: Jennifer Carrillo  [id number]  [id number]  Date of Service: 03/24/2020  Chief Complaint  Patient presents with  . Medicare Wellness    Mammogram, left side pt had tumor removed about 3 years ago, pt noticed an odd smell recently from that area, review meds and check for refills   . Quality Metric Gaps    Eye exam was done last November  . Diabetes  . Gastroesophageal Reflux  . Sleep Apnea  . Hyperlipidemia  . Hypertension  . COPD  . Asthma  . Anemia     HPI Pt is here for routine health maintenance examination  -Temple pain has improved. Occasional headaches but no constant pain. Did not go to have sed rate done. -Had [place] of carotids (results not avail?) and echo but could not complete stress test in office per pt. This was done by Dr. Nehemiah Massed. Echo showed EF 55%, mild-mod MR, trivial TR, mild PR. -Hx of tumor removal in L breast. Letter stating she is due for mammogram and needs to schedule this.  -Pt does have some dementia and is on Aricept. She has difficulty remembering her medications and is not sure what she is taking or needs refills for. Pt's husband will bring her medications when he comes by for nurse visit next week and have CMA update her medications in the chart. -She is due for A1c today, she is followed by endocrinology -She denies any pain currently -She is followed by nephrology Vas US Renal Artery 01/15/20:  Right: RRV flow present. No evidence of right renal artery stenosis.     Normal size right kidney.  Left: Normal size of left kidney. LRV flow present. Cyst(s) noted.     Evidence of patent stent in the proximal RA. Upper limits     normal range RI. Improved flow in the left RA compared to     previous exam.  Current Medication: Outpatient Encounter Medications as of 03/24/2020  Medication Sig Note  . ACCU-CHEK GUIDE  test strip TEST BLOOD SUGAR THREE TIMES DAILY  AS  DIRECTED   . Accu-Chek Softclix Lancets lancets USE AS INSTRUCTED 3 TIMES A DAY   . acetaminophen (TYLENOL) 500 MG tablet Take 1,000 mg by mouth every 6 (six) hours as needed for moderate pain or headache.   . Alcohol Swabs (B-D SINGLE USE SWABS REGULAR) PADS Use as directed for 3 times daily DX E11.65   . amLODipine (NORVASC) 10 MG tablet TAKE 1 TABLET BY MOUTH EVERY DAY   . anastrozole (ARIMIDEX) 1 MG tablet Take 1 mg by mouth every evening.    Marland Kitchen aspirin EC 81 MG EC tablet Take 1 tablet (81 mg total) by mouth daily.   . Blood Glucose Monitoring Suppl (ACCU-CHEK GUIDE ME) w/Device KIT Use as instructed. DX e11.65   . calcium carbonate (OSCAL) 1500 (600 Ca) MG TABS tablet Take 600 mg by mouth 4 (four) times a week.    . cholecalciferol (VITAMIN D) 25 MCG (1000 UNIT) tablet TAKE 1 TABLET EVERY MONDAY, WEDNESDAY, AND FRIDAY.   Marland Kitchen clopidogrel (PLAVIX) 75 MG tablet TAKE 1 TABLET EVERY DAY WITH DINNER   . CRANBERRY PO Take 4,200 mg by mouth 4 (four) times a week.   . digoxin (LANOXIN) 0.125 MG tablet TAKE 1 TABLET EVERY DAY   . docusate sodium (COLACE) 100 MG capsule Take 200 mg by mouth at bedtime as  needed for mild constipation.    Marland Kitchen donepezil (ARICEPT) 10 MG tablet TAKE 1 TABLET BY MOUTH AT  BEDTIME FOR MEMORY   . fenofibrate 54 MG tablet Take 1 tablet (54 mg total) by mouth daily.   . ferrous sulfate 325 (65 FE) MG EC tablet Take 325 mg by mouth 4 (four) times a week.    Marland Kitchen FLUZONE HIGH-DOSE QUADRIVALENT 0.7 ML SUSY    . hydrALAZINE (APRESOLINE) 25 MG tablet TAKE 1 TABLET TWICE DAILY (NEED APPOINTMENT)   . insulin NPH-regular Human (70-30) 100 UNIT/ML injection Inject 30 Units into the skin 2 (two) times daily. 06/25/2018: Took 15 U this AM 0815  . isosorbide mononitrate (IMDUR) 30 MG 24 hr tablet Take 1 tablet (30 mg total) by mouth daily.   . magnesium oxide (MAG-OX) 400 (241.3 Mg) MG tablet TAKE 1 TABLET EVERY DAY   . pantoprazole (PROTONIX) 40  MG tablet TAKE 1 TABLET BY MOUTH  DAILY   . PROAIR HFA 108 (90 Base) MCG/ACT inhaler Inhale 2 puffs into the lungs every 6 (six) hours as needed for wheezing or shortness of breath.    . telmisartan (MICARDIS) 80 MG tablet Take 1 tablet (80 mg total) by mouth daily.   . vitamin C (ASCORBIC ACID) 500 MG tablet Take by mouth daily.    . Vitamin D, Cholecalciferol, 25 MCG (1000 UT) CAPS Take 1,000 Units by mouth every Monday, Wednesday, and Friday.    No facility-administered encounter medications on file as of 03/24/2020.    Surgical History: Past Surgical History:  Procedure Laterality Date  . BREAST SURGERY Left    left lumpectomy  . CAROTID ANGIOGRAPHY Left 06/25/2018   Procedure: CAROTID ANGIOGRAPHY, possible intervention;  Surgeon: Katha Cabal, MD;  Location: [facility];  Service: Cardiovascular;  Laterality: Left;  . CAROTID ENDARTERECTOMY Right   . CAROTID PTA/STENT INTERVENTION Left 06/25/2018   Procedure: CAROTID PTA/STENT INTERVENTION;  Surgeon: Katha Cabal, MD;  Location: [facility];  Service: Cardiovascular;  Laterality: Left;  . CATARACT EXTRACTION Right 2013  . CORONARY STENT PLACEMENT     L. L. E.  . EYE SURGERY Bilateral    cataract  . LEFT HEART CATH AND CORONARY ANGIOGRAPHY Left 05/06/2018   Procedure: LEFT HEART CATH AND CORONARY ANGIOGRAPHY;  Surgeon: Corey Skains, MD;  Location: [place] [facility];  Service: Cardiovascular;  Laterality: Left;  . LOWER EXTREMITY ANGIOGRAPHY Right 01/22/2018   Procedure: LOWER EXTREMITY ANGIOGRAPHY;  Surgeon: Katha Cabal, MD;  Location: [place] [facility];  Service: Cardiovascular;  Laterality: Right;  . RENAL ANGIOGRAPHY Left 02/26/2018   Procedure: RENAL ANGIOGRAPHY;  Surgeon: Katha Cabal, MD;  Location: [facility];  Service: Cardiovascular;  Laterality: Left;  . STENT PLACEMENT VASCULAR ([facility] HX) Left    stent placement on LLE  . TUBAL LIGATION      Medical  History: Past Medical History:  Diagnosis Date  . Arthritis   . Asthma   . Calf pain   . Cancer [facility]) 2016   left breast  . COPD (chronic obstructive pulmonary disease) ([place])   . Diabetes ([place])   . Discoid lupus   . Gastro-esophageal reflux   . Glaucoma   . Hyperlipemia   . Hypertension   . Iron deficiency anemia   . Joint pain   . Leg swelling   . Osteoarthritis   . PVD (peripheral vascular disease) ([place])   . Sinus problem   . Sleep apnea  No CPAP/ Can't tolerate  . Stroke [facility]) 1987    Family History: Family History  Problem Relation Age of Onset  . Heart disease Mother   . Diabetes Other   . Hypertension Other   . Diabetes Sister   . Lung cancer Brother   . Leukemia Daughter   . Bladder Cancer Neg Hx   . Kidney disease Neg Hx   . Prostate cancer Neg Hx       Review of Systems  Constitutional: Negative for chills, fatigue and unexpected weight change.  HENT: Negative for congestion, postnasal drip, rhinorrhea, sneezing and sore throat.   Eyes: Negative for redness.  Respiratory: Negative for cough, chest tightness and shortness of breath.   Cardiovascular: Negative for chest pain and palpitations.  Gastrointestinal: Negative for abdominal pain, constipation, diarrhea, nausea and vomiting.  Genitourinary: Negative for dysuria and frequency.  Musculoskeletal: Positive for arthralgias and gait problem. Negative for back pain, joint swelling and neck pain.       Walks with cane  Skin: Negative for rash.  Neurological: Negative for tremors, numbness and headaches.  Hematological: Negative for adenopathy. Does not bruise/bleed easily.  Psychiatric/Behavioral: Negative for behavioral problems (Depression), sleep disturbance and suicidal ideas. The patient is not nervous/anxious.      Vital Signs: BP (!) 178/64   Pulse 68   Temp (!) 97.4 F (36.3 C)   Resp 16   Ht _0  (1.702 m)   Wt 194 lb (88 kg)   SpO2 99%   BMI 30.38 kg/m    Physical  Exam Constitutional:      General: She is not in acute distress.    Appearance: She is well-developed. She is obese. She is not diaphoretic.  HENT:     Head: Normocephalic and atraumatic.     Right Ear: External ear normal.     Left Ear: External ear normal.     Nose: Nose normal.     Mouth/Throat:     Pharynx: No oropharyngeal exudate.  Eyes:     General: No scleral icterus.       Right eye: No discharge.        Left eye: No discharge.     Conjunctiva/sclera: Conjunctivae normal.     Pupils: Pupils are equal, round, and reactive to light.  Neck:     Thyroid: No thyromegaly.     Vascular: No JVD.     Trachea: No tracheal deviation.  Cardiovascular:     Rate and Rhythm: Normal rate and regular rhythm.     Heart sounds: Normal heart sounds. No murmur heard. No friction rub. No gallop.   Pulmonary:     Effort: Pulmonary effort is normal. No respiratory distress.     Breath sounds: Normal breath sounds. No stridor. No wheezing or rales.  Chest:     Chest wall: No tenderness.  Breasts:     Right: Normal. No mass.     Left: Normal. No mass.    Abdominal:     General: Bowel sounds are normal. There is no distension.     Palpations: Abdomen is soft. There is no mass.     Tenderness: There is no abdominal tenderness. There is no guarding or rebound.  Musculoskeletal:        General: No tenderness or deformity. Normal range of motion.     Cervical back: Normal range of motion and neck supple.  Lymphadenopathy:     Cervical: No cervical adenopathy.  Skin:    General:  Skin is warm and dry.     Coloration: Skin is not pale.     Findings: No erythema or rash.  Neurological:     Mental Status: She is alert.     Cranial Nerves: No cranial nerve deficit.     Motor: No abnormal muscle tone.     Coordination: Coordination normal.     Gait: Gait abnormal.     Deep Tendon Reflexes: Reflexes are normal and symmetric.     Comments: Walks with cane; has difficulty remembering  medications  Psychiatric:        Behavior: Behavior normal.        Thought Content: Thought content normal.        Judgment: Judgment normal.      LABS: No results found for this or any previous visit (from the past 2160 hour(s)).      Assessment/Plan: 1. Encounter for general adult medical examination with abnormal findings Pt had labs done in July. She is due for mammogram and will schedule this.  2. Uncontrolled type 2 diabetes mellitus with hyperglycemia (HCC) - POCT HgB A1C is 5.9 today. Pt is followed by endocrinology.  3. Essential hypertension BP elevated in office today. She does not monitor at home. She is also not sure which medications she is taking. Her husband will bring her medications next week. Pt is also followed by cardiology.  4. Memory deficit Pt has difficulty remembering medications. She will continue on Aricept.  5. Atherosclerotic peripheral vascular disease with intermittent claudication (HCC) Pt will continue on plavix and atorvastatin per cardiology, however statin no longer on med list. Will check on this when meds are brought in.  6. Atherosclerosis of aorta ([place]) Pt will continue on plavix and atorvastatin per cardiology, however statin no longer on med list. Will check on this when meds brought in. She is also supposed to have a stress test with cardiology, and they are monitoring carotid US--results not available on char review.  7. Vitamin D deficiency Continue supplement.  8. Encounter for screening mammogram for malignant neoplasm of breast - MM DIGITAL SCREENING BILATERAL; Future  9. Mixed hyperlipidemia Will confirm pt is still taking atorvastatin 72m.  10. [place] by [place].  11. Dysuria - UA/M w/rflx Culture, Routine   General Counseling: Myleen verbalizes understanding of the findings of todays visit and agrees with plan of treatment. I have discussed any further diagnostic evaluation that  may be needed or ordered today. We also reviewed her medications today. she has been encouraged to call the office with any questions or concerns that should arise related to todays visit.    Counseling:    Orders Placed This Encounter  Procedures  . MM DIGITAL SCREENING BILATERAL  . UA/M w/rflx Culture, Routine  . POCT HgB A1C    No orders of the defined types were placed in this encounter.   This patient was seen by LDrema Dallas PA-C in collaboration with Dr. FClayborn Bignessas a part of collaborative care agreement.  Total time spent:30 Minutes  Time spent includes review of chart, medications, test results, and follow up plan with the patient.     FLavera Guise MD  Internal Medicine

## 2020-03-24 NOTE — Patient Instructions (Signed)

## 2020-03-25 LAB — UA/M W/RFLX CULTURE, ROUTINE
Bilirubin, UA: NEGATIVE
Glucose, UA: NEGATIVE
Ketones, UA: NEGATIVE
Leukocytes,UA: NEGATIVE
Nitrite, UA: NEGATIVE
RBC, UA: NEGATIVE
Specific Gravity, UA: 1.014 (ref 1.005–1.030)
Urobilinogen, Ur: 0.2 mg/dL (ref 0.2–1.0)
pH, UA: 5 (ref 5.0–7.5)

## 2020-03-25 LAB — MICROSCOPIC EXAMINATION
Bacteria, UA: NONE SEEN
Casts: NONE SEEN /lpf
RBC, Urine: NONE SEEN /hpf (ref 0–2)

## 2020-03-25 LAB — POCT GLYCOSYLATED HEMOGLOBIN (HGB A1C): Hemoglobin A1C: 5.9 % — AB (ref 4.0–5.6)

## 2020-03-25 MED ORDER — DIGOXIN 125 MCG PO TABS: 125.0000 ug | ORAL_TABLET | Freq: Every day | ORAL | 1 refills | Status: DC

## 2020-03-27 ENCOUNTER — Encounter: Payer: Self-pay | Admitting: Internal Medicine

## 2020-03-28 ENCOUNTER — Other Ambulatory Visit: Payer: Self-pay | Admitting: Internal Medicine

## 2020-03-28 DIAGNOSIS — Z1231 Encounter for screening mammogram for malignant neoplasm of breast: Secondary | ICD-10-CM

## 2020-03-30 ENCOUNTER — Telehealth: Payer: Self-pay

## 2020-03-30 NOTE — Telephone Encounter (Signed)
Pt came in clinic today and states that Lauren asked her to bring all her medications for [place] to go thru them.  I printed off med list from pt's chart and went thru all meds.  Pt had several kinds of meds that she had duplicates to.  I advised pt to pour all same medication in the newest bottle if there is room and if not she needed to take extra meds and put in a ziploc or a basket until she needs them.  There was 2 of the medications (ATORVASTATIN 40 MG) AND RANOLAZINE ER ) that pt is taking that is not on her med list, and there were several on her med list that she isn't taking.

## 2020-04-08 ENCOUNTER — Other Ambulatory Visit: Payer: Self-pay | Admitting: Internal Medicine

## 2020-04-08 DIAGNOSIS — E1165 Type 2 diabetes mellitus with hyperglycemia: Secondary | ICD-10-CM

## 2020-04-08 DIAGNOSIS — E782 Mixed hyperlipidemia: Secondary | ICD-10-CM

## 2020-04-08 DIAGNOSIS — R300 Dysuria: Secondary | ICD-10-CM

## 2020-04-08 DIAGNOSIS — Z1231 Encounter for screening mammogram for malignant neoplasm of breast: Secondary | ICD-10-CM

## 2020-04-08 DIAGNOSIS — E559 Vitamin D deficiency, unspecified: Secondary | ICD-10-CM

## 2020-04-08 DIAGNOSIS — I70219 Atherosclerosis of native arteries of extremities with intermittent claudication, unspecified extremity: Secondary | ICD-10-CM

## 2020-04-08 DIAGNOSIS — I10 Essential (primary) hypertension: Secondary | ICD-10-CM

## 2020-04-08 DIAGNOSIS — Z0001 Encounter for general adult medical examination with abnormal findings: Secondary | ICD-10-CM

## 2020-04-08 DIAGNOSIS — R413 Other amnesia: Secondary | ICD-10-CM

## 2020-04-08 DIAGNOSIS — I482 Chronic atrial fibrillation, unspecified: Secondary | ICD-10-CM

## 2020-04-08 DIAGNOSIS — I700 Atherosclerosis of aorta: Secondary | ICD-10-CM

## 2020-04-08 DIAGNOSIS — I701 Atherosclerosis of renal artery: Secondary | ICD-10-CM

## 2020-04-08 NOTE — Telephone Encounter (Signed)
Pls check

## 2020-04-23 ENCOUNTER — Other Ambulatory Visit: Payer: Self-pay | Admitting: Internal Medicine

## 2020-04-23 DIAGNOSIS — I10 Essential (primary) hypertension: Secondary | ICD-10-CM

## 2020-04-27 ENCOUNTER — Other Ambulatory Visit: Payer: Self-pay | Admitting: Internal Medicine

## 2020-04-29 ENCOUNTER — Other Ambulatory Visit: Payer: Self-pay | Admitting: Internal Medicine

## 2020-04-29 DIAGNOSIS — I10 Essential (primary) hypertension: Secondary | ICD-10-CM

## 2020-05-04 DIAGNOSIS — N1832 Chronic kidney disease, stage 3b: Secondary | ICD-10-CM | POA: Diagnosis not present

## 2020-05-04 DIAGNOSIS — E1142 Type 2 diabetes mellitus with diabetic polyneuropathy: Secondary | ICD-10-CM | POA: Diagnosis not present

## 2020-05-04 DIAGNOSIS — E1122 Type 2 diabetes mellitus with diabetic chronic kidney disease: Secondary | ICD-10-CM | POA: Diagnosis not present

## 2020-05-04 DIAGNOSIS — Z794 Long term (current) use of insulin: Secondary | ICD-10-CM | POA: Diagnosis not present

## 2020-05-04 DIAGNOSIS — E1159 Type 2 diabetes mellitus with other circulatory complications: Secondary | ICD-10-CM | POA: Diagnosis not present

## 2020-05-04 DIAGNOSIS — E1169 Type 2 diabetes mellitus with other specified complication: Secondary | ICD-10-CM | POA: Diagnosis not present

## 2020-05-04 DIAGNOSIS — E785 Hyperlipidemia, unspecified: Secondary | ICD-10-CM | POA: Diagnosis not present

## 2020-05-04 DIAGNOSIS — I10 Essential (primary) hypertension: Secondary | ICD-10-CM | POA: Diagnosis not present

## 2020-05-04 DIAGNOSIS — I700 Atherosclerosis of aorta: Secondary | ICD-10-CM | POA: Insufficient documentation

## 2020-05-05 ENCOUNTER — Telehealth: Payer: Self-pay

## 2020-05-05 NOTE — Telephone Encounter (Signed)
Faxed Mammogram order to [facility]. Copy placed in scan. Loma Sousa

## 2020-05-09 ENCOUNTER — Other Ambulatory Visit: Payer: Self-pay | Admitting: Physician Assistant

## 2020-05-09 DIAGNOSIS — Z1231 Encounter for screening mammogram for malignant neoplasm of breast: Secondary | ICD-10-CM

## 2020-05-12 ENCOUNTER — Telehealth: Payer: Self-pay

## 2020-05-12 NOTE — Telephone Encounter (Signed)
Patient scheduled for Mammogram at [facility] on 05-25-20 at 9:35a. Jennifer Carrillo

## 2020-05-18 ENCOUNTER — Other Ambulatory Visit: Payer: Self-pay | Admitting: Internal Medicine

## 2020-05-20 DIAGNOSIS — B351 Tinea unguium: Secondary | ICD-10-CM | POA: Diagnosis not present

## 2020-05-20 DIAGNOSIS — E1140 Type 2 diabetes mellitus with diabetic neuropathy, unspecified: Secondary | ICD-10-CM | POA: Diagnosis not present

## 2020-05-20 DIAGNOSIS — L851 Acquired keratosis [keratoderma] palmaris et plantaris: Secondary | ICD-10-CM | POA: Diagnosis not present

## 2020-05-20 DIAGNOSIS — Z794 Long term (current) use of insulin: Secondary | ICD-10-CM | POA: Diagnosis not present

## 2020-05-25 DIAGNOSIS — Z853 Personal history of malignant neoplasm of breast: Secondary | ICD-10-CM | POA: Diagnosis not present

## 2020-05-25 DIAGNOSIS — R922 Inconclusive mammogram: Secondary | ICD-10-CM | POA: Diagnosis not present

## 2020-05-25 DIAGNOSIS — Z08 Encounter for follow-up examination after completed treatment for malignant neoplasm: Secondary | ICD-10-CM | POA: Diagnosis not present

## 2020-06-01 ENCOUNTER — Other Ambulatory Visit: Payer: Self-pay

## 2020-06-01 DIAGNOSIS — I10 Essential (primary) hypertension: Secondary | ICD-10-CM

## 2020-06-01 MED ORDER — ANASTROZOLE 1 MG PO TABS: 1.0000 mg | ORAL_TABLET | Freq: Every evening | ORAL | 1 refills | Status: DC

## 2020-06-01 MED ORDER — AMLODIPINE BESYLATE 10 MG PO TABS: 1.0000 | ORAL_TABLET | Freq: Every day | ORAL | 1 refills | Status: DC

## 2020-06-20 ENCOUNTER — Telehealth: Payer: Self-pay | Admitting: Pharmacist

## 2020-06-20 ENCOUNTER — Telehealth: Payer: Self-pay | Admitting: Internal Medicine

## 2020-06-20 NOTE — Progress Notes (Addendum)
Chronic Care Management Pharmacy Assistant   Name: BONNIE OVERDORF  MRN: [id number] DOB: 21-Nov-1933  Jennifer Carrillo is an 87 y.o. year old female who presents for his initial CCM visit with the clinical pharmacist.  Reason for Encounter: Chart Prep    Conditions to be addressed/monitored: HTN, PAD, Type 2 DM, RAD.  Primary concerns for visit include: HTN.  Recent office visits:  03/24/20 Mylinda Latina, PA-C. For general adult medical examination. No medication changes.   Recent consult visits:  05/20/20 Podiatry Solum, Felipa Evener, MD. No medication changes.  05/04/20 Endocrinology Solum, Felipa Evener, MD For follow-up. No medication changes. 01/21/20 Cardiology Corey Skains. No information given. 01/15/20 Vascular Surgery Schnier, [name]. For Atherosclerosis of renal artery 01/03/21 Nephrology Murlean Iba For follow-up. No information given.  Hospital visits:  None in previous 6 months  Medication History: Telmisartan 80 mg 90 DS 02/19/20  Medications: Outpatient Encounter Medications as of 06/20/2020  Medication Sig Note   ACCU-CHEK GUIDE test strip TEST BLOOD SUGAR THREE TIMES DAILY  AS  DIRECTED    Accu-Chek Softclix Lancets lancets USE AS INSTRUCTED 3 TIMES A DAY    acetaminophen (TYLENOL) 500 MG tablet Take 1,000 mg by mouth every 6 (six) hours as needed for moderate pain or headache.    Alcohol Swabs (B-D SINGLE USE SWABS REGULAR) PADS Use as directed for 3 times daily DX E11.65    amLODipine (NORVASC) 10 MG tablet Take 1 tablet (10 mg total) by mouth daily.    anastrozole (ARIMIDEX) 1 MG tablet Take 1 tablet (1 mg total) by mouth every evening.    aspirin EC 81 MG EC tablet Take 1 tablet (81 mg total) by mouth daily.    Blood Glucose Monitoring Suppl (ACCU-CHEK GUIDE ME) w/Device KIT Use as instructed. DX e11.65    calcium carbonate (OSCAL) 1500 (600 Ca) MG TABS tablet Take 600 mg by mouth 4 (four) times a week.     cholecalciferol (VITAMIN D) 25 MCG  (1000 UNIT) tablet TAKE 1 TABLET EVERY MONDAY, WEDNESDAY, AND FRIDAY.    clopidogrel (PLAVIX) 75 MG tablet TAKE 1 TABLET EVERY DAY WITH DINNER    CRANBERRY PO Take 4,200 mg by mouth 4 (four) times a week.    digoxin (LANOXIN) 0.125 MG tablet Take 1 tablet (125 mcg total) by mouth daily.    docusate sodium (COLACE) 100 MG capsule Take 200 mg by mouth at bedtime as needed for mild constipation.     donepezil (ARICEPT) 10 MG tablet TAKE 1 TABLET BY MOUTH AT  BEDTIME FOR MEMORY    ferrous sulfate 325 (65 FE) MG EC tablet Take 325 mg by mouth 4 (four) times a week.     FLUZONE HIGH-DOSE QUADRIVALENT 0.7 ML SUSY     hydrALAZINE (APRESOLINE) 25 MG tablet TAKE 1 TABLET TWICE DAILY (NEED APPOINTMENT)    insulin NPH-regular Human (70-30) 100 UNIT/ML injection Inject 30 Units into the skin 2 (two) times daily. 06/25/2018: Took 15 U this AM 0815   isosorbide mononitrate (IMDUR) 30 MG 24 hr tablet Take 1 tablet (30 mg total) by mouth daily.    magnesium oxide (MAG-OX) 400 (241.3 Mg) MG tablet TAKE 1 TABLET EVERY DAY    pantoprazole (PROTONIX) 40 MG tablet TAKE 1 TABLET BY MOUTH  DAILY    PROAIR HFA 108 (90 Base) MCG/ACT inhaler Inhale 2 puffs into the lungs every 6 (six) hours as needed for wheezing or shortness of breath.     telmisartan (  MICARDIS) 80 MG tablet TAKE 1 TABLET EVERY DAY    vitamin C (ASCORBIC ACID) 500 MG tablet Take by mouth daily.     Vitamin D, Cholecalciferol, 25 MCG (1000 UT) CAPS Take 1,000 Units by mouth every Monday, Wednesday, and Friday.    No facility-administered encounter medications on file as of 06/20/2020.   Have you seen any other providers since your last visit? Patient stated no.  Any changes in your medications or health? Patient stated no.  Any side effects from any medications? Patient stated no.  Do you have an symptoms or problems not managed by your medications? Patient stated no.  Any concerns about your health right now? Patient stated no.  Has your provider  asked that you check blood pressure, blood sugar, or follow special diet at home? Patiens husband stated no.  Do you get any type of exercise on a regular basis? Patients husband stated no.  Can you think of a goal you would like to reach for your health? Patients husband she wants to improve her energy.  Do you have any problems getting your medications? Patients husband stated her insulin is very expensive.   Is there anything that you would like to discuss during the appointment? Patients husband stated no.  Please bring medications and supplements to appointment, patients husband reminded of his wife's appointment on 06/22/20 1:30 pm.   Follow-Up:Pharmacist Review  Charlann Lange, [place] Pharmacist Assistant [phone]

## 2020-06-20 NOTE — Chronic Care Management (AMB) (Signed)
  Chronic Care Management   Note  06/20/2020 Name: Jennifer Carrillo MRN: [id number] DOB: [birth year]  Jennifer Carrillo is a 87 y.o. year old female who is a primary care patient of [name], MD. I reached out to [name] by phone today in response to a referral sent by Ms. Daiva Nakayama Corkern's PCP, [name], MD.   Ms. Azbell was given information about Chronic Care Management services today including:  CCM service includes personalized support from designated clinical staff supervised by her physician, including individualized plan of care and coordination with other care providers 24/7 contact phone numbers for assistance for urgent and routine care needs. Service will only be billed when office clinical staff spend 20 minutes or more in a month to coordinate care. Only one practitioner may furnish and bill the service in a calendar month. The patient may stop CCM services at any time (effective at the end of the month) by phone call to the office staff.   Fritzie Prioleau  verbally agreed to assistance and services provided by embedded care coordination/care management team today.  Follow up plan:   Tatjana Secretary/administrator

## 2020-06-21 NOTE — Progress Notes (Signed)
Chronic Care Management Pharmacy Note  06/23/2020 Name:  Jennifer Carrillo MRN:  [id number] DOB:  1933-02-09  Summary: Initial visit with PharmD.  Discussed elevated LDL and the importance of statin, she reports 100% adherence lately.  No home monitoring for BP currently.  Updated medication list to include statin and Ranexa.  Recommendations/Changes made from today's visit: Recommend repeat lipid panel ASAP Set patient up with RPM monitoring for BP  Plan: F/u in 3 months on phone   Subjective: Jennifer Carrillo is an 87 y.o. year old female who is a primary patient of Humphrey Rolls, Timoteo Gaul, MD.  The CCM team was consulted for assistance with disease management and care coordination needs.    Engaged with patient by telephone for initial visit in response to provider referral for pharmacy case management and/or care coordination services.   Consent to Services:  The patient was given the following information about Chronic Care Management services today, agreed to services, and gave verbal consent: 1. CCM service includes personalized support from designated clinical staff supervised by the primary care provider, including individualized plan of care and coordination with other care providers 2. 24/7 contact phone numbers for assistance for urgent and routine care needs. 3. Service will only be billed when office clinical staff spend 20 minutes or more in a month to coordinate care. 4. Only one practitioner may furnish and bill the service in a calendar month. 5.The patient may stop CCM services at any time (effective at the end of the month) by phone call to the office staff. 6. The patient will be responsible for cost sharing (co-pay) of up to 20% of the service fee (after annual deductible is met). Patient agreed to services and consent obtained.  Patient Care Team: [name], MD as PCP - General (Internal Medicine) Edythe Clarity, [facility] as Pharmacist (Pharmacist)  Recent office visits:   03/24/20 Mylinda Latina, PA-C. For general adult medical examination. No medication changes.    Recent consult visits:  05/20/20 Podiatry Solum, Felipa Evener, MD. No medication changes. 05/04/20 Endocrinology Solum, Felipa Evener, MD For follow-up. No medication changes. 01/21/20 Cardiology Corey Skains. No information given. 01/15/20 Vascular Surgery Schnier, [name]. For Atherosclerosis of renal artery 01/03/21 Nephrology Murlean Iba For follow-up. No information given.   Hospital visits:  None in previous 6 months   Medication History: Telmisartan 80 mg 90 DS 02/19/20   Objective:  Lab Results  Component Value Date   CREATININE 1.73 (H) 02/13/2019   BUN 25 02/13/2019   GFRNONAA 27 (L) 02/13/2019   GFRAA 31 (L) 02/13/2019   NA 142 02/13/2019   K 4.6 02/13/2019   CALCIUM 9.5 02/13/2019   CO2 21 02/13/2019   GLUCOSE 157 (H) 02/13/2019    Lab Results  Component Value Date/Time   HGBA1C 5.9 (A) 03/25/2020 03:56 PM   HGBA1C 7.6 (A) 09/08/2019 09:06 AM    Last diabetic Eye exam: No results found for: HMDIABEYEEXA  Last diabetic Foot exam: No results found for: HMDIABFOOTEX   Lab Results  Component Value Date   CHOL 347 (H) 02/13/2019   HDL 47 02/13/2019   LDLCALC 239 (H) 02/13/2019   TRIG 288 (H) 02/13/2019    Hepatic Function Latest Ref Rng & Units 02/13/2019 06/27/2018 12/17/2017  Total Protein 6.0 - 8.5 g/dL 6.2 6.4(L) 6.1  Albumin 3.6 - 4.6 g/dL 3.5(L) 2.9(L) 3.4(L)  AST 0 - 40 IU/L 12 14(L) 13  ALT 0 - 32 IU/L 10 8 8  Alk Phosphatase 39 - 117 IU/L 91 54 95  Total Bilirubin 0.0 - 1.2 mg/dL 0.2 0.5 0.2    Lab Results  Component Value Date/Time   TSH 3.500 02/13/2019 10:45 AM   TSH 2.470 04/10/2017 08:47 AM   FREET4 1.00 02/13/2019 10:45 AM   FREET4 0.99 04/10/2017 08:47 AM    CBC Latest Ref Rng & Units 07/15/2019 06/27/2018 06/26/2018  WBC 3.4 - 10.8 x10E3/uL 7.8 7.9 8.6  Hemoglobin 11.1 - 15.9 g/dL 10.1(L) 8.6(L) 8.8(L)  Hematocrit 34.0 - 46.6 %  33.0(L) 28.1(L) 28.8(L)  Platelets 150 - 450 x10E3/uL 341 199 214    Lab Results  Component Value Date/Time   VD25OH 12.2 (L) 07/15/2019 11:48 AM    Clinical ASCVD: Yes  The ASCVD Risk score Mikey Bussing DC Jr., et al., 2013) failed to calculate for the following reasons:   The 2013 ASCVD risk score is only valid for ages 77 to 58   The patient has a prior MI or stroke diagnosis    Depression screen [facility] 2/9 03/24/2020 09/08/2019 02/13/2019  Decreased Interest 0 0 1  Down, Depressed, Hopeless 0 0 1  PHQ - 2 Score 0 0 2  Altered sleeping - - 0  Tired, decreased energy - - 2  Change in appetite - - 1  Feeling bad or failure about yourself  - - 1  Trouble concentrating - - 3  Moving slowly or fidgety/restless - - 1  Suicidal thoughts - - 0  PHQ-9 Score - - 10     Social History   Tobacco Use  Smoking Status Former   Pack years: 0.00   Types: Cigarettes   Quit date: 1987   Years since quitting: 35.4  Smokeless Tobacco Never  Tobacco Comments   quit 31 years   BP Readings from Last 3 Encounters:  03/24/20 (!) 178/64  09/08/19 (!) 142/62  07/15/19 (!) 142/57   Pulse Readings from Last 3 Encounters:  03/24/20 68  09/08/19 64  07/15/19 68   Wt Readings from Last 3 Encounters:  03/24/20 194 lb (88 kg)  09/08/19 204 lb 9.6 oz (92.8 kg)  07/15/19 205 lb (93 kg)   BMI Readings from Last 3 Encounters:  03/24/20 30.38 kg/m  09/08/19 32.04 kg/m  07/15/19 32.11 kg/m    Assessment/Interventions: Review of patient past medical history, allergies, medications, health status, including review of consultants reports, laboratory and other test data, was performed as part of comprehensive evaluation and provision of chronic care management services.   SDOH:  (Social Determinants of Health) assessments and interventions performed: Yes  Financial Resource Strain: Low Risk    Difficulty of Paying Living Expenses: Not very hard     SDOH Screenings   Alcohol Screen: Not on file   Depression (PHQ2-9): Low Risk    PHQ-2 Score: 0  Financial Resource Strain: Low Risk    Difficulty of Paying Living Expenses: Not very hard  Food Insecurity: Not on file  Housing: Not on file  Physical Activity: Not on file  Social Connections: Not on file  Stress: Not on file  Tobacco Use: Medium Risk   Smoking Tobacco Use: Former   Smokeless Tobacco Use: Never  Transportation Needs: Not on file    [place]  Allergies  Allergen Reactions   Benazepril Nausea Only    Medications Reviewed Today     Reviewed by Edythe Clarity, [facility] (Pharmacist) on 06/23/20 at [place] Status: <None>   Medication Order Taking? Sig  Documenting Provider Last Dose Status Informant  ACCU-CHEK GUIDE test strip [id number] Yes TEST BLOOD SUGAR THREE TIMES DAILY  AS  DIRECTED [name], MD Taking Active   Accu-Chek Softclix Lancets lancets [id number] Yes USE AS INSTRUCTED 3 TIMES A DAY [name], MD Taking Active   acetaminophen (TYLENOL) 500 MG tablet [id number] Yes Take 1,000 mg by mouth every 6 (six) hours as needed for moderate pain or headache. [provider] Taking Active Self  Alcohol Swabs (B-D SINGLE USE SWABS REGULAR) PADS [id number] Yes Use as directed for 3 times daily DX E11.65 Kendell Bane, NP Taking Active   amLODipine (NORVASC) 10 MG tablet [id number] Yes Take 1 tablet (10 mg total) by mouth daily. [name], MD Taking Active   anastrozole (ARIMIDEX) 1 MG tablet [id number] Yes Take 1 tablet (1 mg total) by mouth every evening. [name], MD Taking Active   aspirin EC 81 MG EC tablet [id number] Yes Take 1 tablet (81 mg total) by mouth daily. Stegmayer, Janalyn Harder, PA-C Taking Active   atorvastatin (LIPITOR) 40 MG tablet [id number] Yes Take 40 mg by mouth daily. [provider] Taking Active   Blood Glucose Monitoring Suppl (ACCU-CHEK GUIDE ME) w/Device KIT [id number] Yes Use as instructed. DX e11.65 Kendell Bane, NP Taking Active   calcium  carbonate (OSCAL) 1500 (600 Ca) MG TABS tablet [id number] Yes Take 600 mg by mouth 4 (four) times a week.  [provider] Taking Active Self  cholecalciferol (VITAMIN D) 25 MCG (1000 UNIT) tablet [id number] No TAKE 1 TABLET EVERY MONDAY, WEDNESDAY, AND FRIDAY.  Patient not taking: Reported on 06/22/2020   Luiz Ochoa, NP Not Taking Active   clopidogrel (PLAVIX) 75 MG tablet [id number] Yes TAKE 1 TABLET EVERY DAY WITH DINNER [name], MD Taking Active   CRANBERRY PO [id number]  Take 4,200 mg by mouth 4 (four) times a week. [provider]  Active   digoxin (LANOXIN) 0.125 MG tablet [id number] Yes Take 1 tablet (125 mcg total) by mouth daily. McDonough, Si Gaul, PA-C Taking Active   docusate sodium (COLACE) 100 MG capsule [id number] Yes Take 200 mg by mouth at bedtime as needed for mild constipation.  [provider] Taking Active Self  donepezil (ARICEPT) 10 MG tablet [id number] Yes TAKE 1 TABLET BY MOUTH AT  BEDTIME FOR MEMORY Luiz Ochoa, NP Taking Active   ferrous sulfate 325 (65 FE) MG EC tablet [id number] No Take 325 mg by mouth 4 (four) times a week.   Patient not taking: Reported on 06/22/2020   [provider] Not Taking Active Self  FLUZONE HIGH-DOSE QUADRIVALENT 0.7 ML SUSY [id number]   [provider]  Active   hydrALAZINE (APRESOLINE) 25 MG tablet [id number] Yes TAKE 1 TABLET TWICE DAILY (NEED APPOINTMENT) [name], MD Taking Active   insulin NPH-regular Human (70-30) 100 UNIT/ML injection [id number] Yes Inject 30 Units into the skin 2 (two) times daily. [provider] Taking Active Self           Med Note Charlott Rakes, ROBIN R   Wed Jun 25, 2018  9:13 AM) Took 15 U this AM 0815  isosorbide mononitrate (IMDUR) 30 MG 24 hr tablet [id number] Yes Take 1 tablet (30 mg total) by mouth daily. Kendell Bane, NP Taking Active   magnesium oxide (MAG-OX) 400 (241.3 Mg) MG tablet [id number] Yes TAKE 1 TABLET EVERY DAY [name], MD  Taking Active   pantoprazole (PROTONIX) 40 MG  tablet [id number] No TAKE 1 TABLET BY MOUTH  DAILY  Patient not taking: Reported on 06/22/2020   Kendell Bane, NP Not Taking Active   PROAIR HFA 108 [phone] Base) MCG/ACT inhaler [id number] Yes Inhale 2 puffs into the lungs every 6 (six) hours as needed for wheezing or shortness of breath.  [provider] Taking Active Self  ranolazine (RANEXA) 500 MG 12 hr tablet [id number] Yes Take 500 mg by mouth 2 (two) times daily. [provider]  Active   telmisartan (MICARDIS) 80 MG tablet [id number] Yes TAKE 1 TABLET EVERY DAY [name], MD Taking Active   vitamin C (ASCORBIC ACID) 500 MG tablet [id number] Yes Take by mouth daily. [provider] Taking Active Self  Vitamin D, Cholecalciferol, 25 MCG (1000 UT) CAPS [id number]  Take 1,000 Units by mouth every Monday, Wednesday, and Friday. Luiz Ochoa, NP  Active             Patient Active Problem List   Diagnosis Date Noted   Carotid stenosis, symptomatic w/o infarct 06/25/2018   Hypertension 06/18/2018   Carbuncle and furuncle of buttock 06/18/2018   Coronary artery disease involving native coronary artery of native heart 05/19/2018   Old inferior wall myocardial infarction 05/19/2018   Stable angina ([place]) 03/31/2018   Abnormal ECG 03/07/2018   Benign essential HTN 03/07/2018   Bilateral carotid artery stenosis 03/07/2018   SOBOE (shortness of breath on exertion) 03/07/2018   Atherosclerotic peripheral vascular disease with intermittent claudication ([place]) 03/07/2018   Carotid stenosis 02/26/2018   Renal artery stenosis (HCC) 02/26/2018   Anemia 02/05/2018   Leg pain 01/13/2018   PAD (peripheral artery disease) ([place]) 01/13/2018   Diabetes mellitus without complication ([place]) 59/74/1638   Renovascular hypertension 04/09/2017   Primary osteoarthritis of both knees 03/12/2017   Malignant neoplasm of breast in female, estrogen receptor positive ([place]) 12/16/2015    Iron deficiency anemia 12/08/2015    Immunization History  Administered Date(s) Administered   Influenza Nasal 10/08/2017   Influenza, High Dose Seasonal PF 10/19/2017, 09/26/2018   Influenza, Quadrivalent, Recombinant, Inj, Pf 12/11/2018   Influenza-Unspecified 09/28/2015, 10/05/2016, 10/19/2017, 10/16/2019   PFIZER(Purple Top)SARS-COV-2 Vaccination 03/25/2019, 10/31/2019   Pneumococcal Polysaccharide-23 08/02/2017   Pneumococcal-Unspecified 09/26/2015    Conditions to be addressed/monitored:  CAD, HTN, DM, Osteoarthritis, Shortness of Breath  Care Plan : General Pharmacy (Adult)  Updates made by Edythe Clarity, RPH since 06/23/2020 12:00 AM     Problem: CAD, HTN, DM, Osteoarthritis, Shortness of Breath   Priority: High  Onset Date: 06/22/2020     Goal: Patient-Specific Goal   Note:   Current Barriers:  Unable to achieve control of cholesterol   Pharmacist Clinical Goal(s):  Patient will achieve adherence to monitoring guidelines and medication adherence to achieve therapeutic efficacy achieve control of cholesterol as evidenced by lipid panel adhere to prescribed medication regimen as evidenced by fill dates contact provider office for questions/concerns as evidenced notation of same in electronic health record through collaboration with PharmD and provider.   Interventions: 1:1 collaboration with [name], MD regarding development and update of comprehensive plan of care as evidenced by provider attestation and co-signature Inter-disciplinary care team collaboration (see longitudinal plan of care) Comprehensive medication review performed; medication list updated in electronic medical record  Hypertension (BP goal <130/80) -Unknown at today's visit whether controlled -Current treatment: Telmisartan 81m daily - afternoon Hydralazine 263mBID Imdur 3054m4hr daily Amlodipine 79m56mily Digoxin 0.125mg42mly -Medications previously tried: spironolactone   -  Current home readings: no logs, meter is not working -Current dietary habits: reports a good appetite, does love her sweets and junk food, workingo n limiting these -Current exercise habits: minimal -Denies hypotensive/hypertensive symptoms -Educated on BP goals and benefits of medications for prevention of heart attack, stroke and kidney damage; Daily salt intake goal < 2300 mg; Exercise goal of 150 minutes per week; Importance of home blood pressure monitoring; Symptoms of hypotension and importance of maintaining adequate hydration; -Counseled to monitor BP at home a few times per week, document, and provide log at future appointments -Counseled on diet and exercise extensively Recommended to continue current medication Patient would greatly benefit from having a BP cuff at home to monitor BP given her extensive CV history.  Her meter is currently broken, therefore I think the would benefit greatly from having a RPM monitoring device at home that would also communicate BP to MD office and allow [place] to monitor remotely.  Patient is agreeable and believes this is a good idea.  Diabetes (A1c goal <7%) -Controlled -Current medications: Novolin 70/30 30 units 35 units qpm -Medications previously tried: metformin  -Current home glucose readings home glucose: reports all glucose readings < 200; does not have specific logs  -Denies hypoglycemic/hyperglycemic symptoms  -Current exercise: minimal -Educated on A1c and blood sugar goals; Complications of diabetes including kidney damage, retinal damage, and cardiovascular disease; Exercise goal of 150 minutes per week; Prevention and management of hypoglycemic episodes; Benefits of routine self-monitoring of blood sugar; -Counseled to check feet daily and get yearly eye exams -Counseled on diet and exercise extensively Recommended to continue current medication Assessed patient finances. She reports that her copay for her insulin is high  and the amount she is using is increasing.  Based on income I believe she would qualify for PAP through [employer].  Will have application mailed to home for patient to complete.  Hyperlipidemia/PAD/CAD: (LDL goal < 70) -Uncontrolled -Current treatment: Atorvastatin 31m daily -Medications previously tried: Zetia  -LDL was very elevated at last check, it has been over a year since we have done lipid panel, she has been taking statin off and on since then due to perceived negative side effects.  She has since started taking it regularly and reports 100% compliance. -Educated on Cholesterol goals;  Benefits of statin for ASCVD risk reduction; Importance of limiting foods high in cholesterol; Strategies to manage statin-induced myalgias; -Counseled on diet and exercise extensively Recommended to continue current medication Recommended updated lipid panel ASAP to check efficacy of statin, if remains elevated would recommend Crestor 242mdaily.  SOB (Goal: Minimize symptoms) -Controlled -Current treatment  Proair HFA 90 mcg prn -Medications previously tried: none noted -Reports breathing has been controlled lately, not much use of inhalers  -Recommended to continue current medication  Osteoarthritis (Goal: Minimize symptoms) -Controlled -Current treatment  APAP 50065m6h prn -Medications previously tried: none noted -Pain controlled for the most part  -Recommended to continue current medication  Patient Goals/Self-Care Activities Patient will:  - take medications as prescribed focus on medication adherence by pill box check blood pressure daily, document, and provide at future appointments collaborate with provider on medication access solutions  Follow Up Plan: The care management team will reach out to the patient again over the next 90 days.        Medication Assistance: Application for novo nordisk  medication assistance program. in process.  Anticipated assistance start  date unknown.  See plan of care for additional detail.  Compliance/Adherence/Medication  fill history: Care Gaps: Foot and eye exams needed  Star-Rating Drugs: Telmisartan 80 mg 90 DS 02/19/20  Patient's preferred pharmacy is:  [id number] [address]: [phone]: [phone] [facility]  [address]Phone: [phone]: [address] [address] [place]Phone: [phone]: [phone] Uses pill box? Yes Pt endorses 100% compliance  We discussed: Benefits of medication synchronization, packaging and delivery as well as enhanced pharmacist oversight with [employer]. Patient decided to: Continue current medication management strategy  Care Plan and Follow Up Patient Decision:  Patient agrees to Care Plan and Follow-up.  Plan: The care management team will reach out to the patient again over the next 90 days.  CBeverly Milch PharmD Clinical Pharmacist [facility]([phone]

## 2020-06-22 ENCOUNTER — Ambulatory Visit: Payer: Medicare HMO | Admitting: Pharmacist

## 2020-06-22 DIAGNOSIS — E119 Type 2 diabetes mellitus without complications: Secondary | ICD-10-CM

## 2020-06-22 DIAGNOSIS — I10 Essential (primary) hypertension: Secondary | ICD-10-CM

## 2020-06-22 DIAGNOSIS — R0602 Shortness of breath: Secondary | ICD-10-CM

## 2020-06-22 DIAGNOSIS — M170 Bilateral primary osteoarthritis of knee: Secondary | ICD-10-CM

## 2020-06-23 ENCOUNTER — Telehealth: Payer: Self-pay | Admitting: Pharmacist

## 2020-06-23 NOTE — Progress Notes (Addendum)
Chronic Care Management Pharmacy Assistant   Name: Jennifer Carrillo  MRN: [id number] DOB: [birth year]  Reason for Encounter: Initial RPM  Medications: Outpatient Encounter Medications as of 06/23/2020  Medication Sig Note   ACCU-CHEK GUIDE test strip TEST BLOOD SUGAR THREE TIMES DAILY  AS  DIRECTED    Accu-Chek Softclix Lancets lancets USE AS INSTRUCTED 3 TIMES A DAY    acetaminophen (TYLENOL) 500 MG tablet Take 1,000 mg by mouth every 6 (six) hours as needed for moderate pain or headache.    Alcohol Swabs (B-D SINGLE USE SWABS REGULAR) PADS Use as directed for 3 times daily DX E11.65    amLODipine (NORVASC) 10 MG tablet Take 1 tablet (10 mg total) by mouth daily.    anastrozole (ARIMIDEX) 1 MG tablet Take 1 tablet (1 mg total) by mouth every evening.    aspirin EC 81 MG EC tablet Take 1 tablet (81 mg total) by mouth daily.    atorvastatin (LIPITOR) 40 MG tablet Take 40 mg by mouth daily.    Blood Glucose Monitoring Suppl (ACCU-CHEK GUIDE ME) w/Device KIT Use as instructed. DX e11.65    calcium carbonate (OSCAL) 1500 (600 Ca) MG TABS tablet Take 600 mg by mouth 4 (four) times a week.     cholecalciferol (VITAMIN D) 25 MCG (1000 UNIT) tablet TAKE 1 TABLET EVERY MONDAY, WEDNESDAY, AND FRIDAY. (Patient not taking: Reported on 06/22/2020)    clopidogrel (PLAVIX) 75 MG tablet TAKE 1 TABLET EVERY DAY WITH DINNER    CRANBERRY PO Take 4,200 mg by mouth 4 (four) times a week.    digoxin (LANOXIN) 0.125 MG tablet Take 1 tablet (125 mcg total) by mouth daily.    docusate sodium (COLACE) 100 MG capsule Take 200 mg by mouth at bedtime as needed for mild constipation.     donepezil (ARICEPT) 10 MG tablet TAKE 1 TABLET BY MOUTH AT  BEDTIME FOR MEMORY    ferrous sulfate 325 (65 FE) MG EC tablet Take 325 mg by mouth 4 (four) times a week.  (Patient not taking: Reported on 06/22/2020)    FLUZONE HIGH-DOSE QUADRIVALENT 0.7 ML SUSY     hydrALAZINE (APRESOLINE) 25 MG tablet TAKE 1 TABLET TWICE DAILY (NEED  APPOINTMENT)    insulin NPH-regular Human (70-30) 100 UNIT/ML injection Inject 30 Units into the skin 2 (two) times daily. 06/25/2018: Took 15 U this AM 0815   isosorbide mononitrate (IMDUR) 30 MG 24 hr tablet Take 1 tablet (30 mg total) by mouth daily.    magnesium oxide (MAG-OX) 400 (241.3 Mg) MG tablet TAKE 1 TABLET EVERY DAY    pantoprazole (PROTONIX) 40 MG tablet TAKE 1 TABLET BY MOUTH  DAILY (Patient not taking: Reported on 06/22/2020)    PROAIR HFA 108 (90 Base) MCG/ACT inhaler Inhale 2 puffs into the lungs every 6 (six) hours as needed for wheezing or shortness of breath.     ranolazine (RANEXA) 500 MG 12 hr tablet Take 500 mg by mouth 2 (two) times daily.    telmisartan (MICARDIS) 80 MG tablet TAKE 1 TABLET EVERY DAY    vitamin C (ASCORBIC ACID) 500 MG tablet Take by mouth daily.    Vitamin D, Cholecalciferol, 25 MCG (1000 UT) CAPS Take 1,000 Units by mouth every Monday, Wednesday, and Friday.    No facility-administered encounter medications on file as of 06/23/2020.   Spoke with the patient in regards of the RPM program for checking her blood pressure. We discussed the process and that she had to  check her blood pressure at least 16 times a month.She voiced understandings and is looking forward to someone coming to her home to show her how to use the blood pressure machine.    Follow-Up:Pharmacist Review  Charlann Lange, [place] Pharmacist Assistant [phone]

## 2020-06-23 NOTE — Progress Notes (Addendum)
Chronic Care Management Pharmacy Assistant   Name: COZETTA SEIF  MRN: [id number] DOB: 07-02-1933  Reason for Encounter: PAP  Medications: Outpatient Encounter Medications as of 06/23/2020  Medication Sig Note   ACCU-CHEK GUIDE test strip TEST BLOOD SUGAR THREE TIMES DAILY  AS  DIRECTED    Accu-Chek Softclix Lancets lancets USE AS INSTRUCTED 3 TIMES A DAY    acetaminophen (TYLENOL) 500 MG tablet Take 1,000 mg by mouth every 6 (six) hours as needed for moderate pain or headache.    Alcohol Swabs (B-D SINGLE USE SWABS REGULAR) PADS Use as directed for 3 times daily DX E11.65    amLODipine (NORVASC) 10 MG tablet Take 1 tablet (10 mg total) by mouth daily.    anastrozole (ARIMIDEX) 1 MG tablet Take 1 tablet (1 mg total) by mouth every evening.    aspirin EC 81 MG EC tablet Take 1 tablet (81 mg total) by mouth daily.    atorvastatin (LIPITOR) 40 MG tablet Take 40 mg by mouth daily.    Blood Glucose Monitoring Suppl (ACCU-CHEK GUIDE ME) w/Device KIT Use as instructed. DX e11.65    calcium carbonate (OSCAL) 1500 (600 Ca) MG TABS tablet Take 600 mg by mouth 4 (four) times a week.     cholecalciferol (VITAMIN D) 25 MCG (1000 UNIT) tablet TAKE 1 TABLET EVERY MONDAY, WEDNESDAY, AND FRIDAY. (Patient not taking: Reported on 06/22/2020)    clopidogrel (PLAVIX) 75 MG tablet TAKE 1 TABLET EVERY DAY WITH DINNER    CRANBERRY PO Take 4,200 mg by mouth 4 (four) times a week.    digoxin (LANOXIN) 0.125 MG tablet Take 1 tablet (125 mcg total) by mouth daily.    docusate sodium (COLACE) 100 MG capsule Take 200 mg by mouth at bedtime as needed for mild constipation.     donepezil (ARICEPT) 10 MG tablet TAKE 1 TABLET BY MOUTH AT  BEDTIME FOR MEMORY    ferrous sulfate 325 (65 FE) MG EC tablet Take 325 mg by mouth 4 (four) times a week.  (Patient not taking: Reported on 06/22/2020)    FLUZONE HIGH-DOSE QUADRIVALENT 0.7 ML SUSY     hydrALAZINE (APRESOLINE) 25 MG tablet TAKE 1 TABLET TWICE DAILY (NEED  APPOINTMENT)    insulin NPH-regular Human (70-30) 100 UNIT/ML injection Inject 30 Units into the skin 2 (two) times daily. 06/25/2018: Took 15 U this AM 0815   isosorbide mononitrate (IMDUR) 30 MG 24 hr tablet Take 1 tablet (30 mg total) by mouth daily.    magnesium oxide (MAG-OX) 400 (241.3 Mg) MG tablet TAKE 1 TABLET EVERY DAY    pantoprazole (PROTONIX) 40 MG tablet TAKE 1 TABLET BY MOUTH  DAILY (Patient not taking: Reported on 06/22/2020)    PROAIR HFA 108 (90 Base) MCG/ACT inhaler Inhale 2 puffs into the lungs every 6 (six) hours as needed for wheezing or shortness of breath.     ranolazine (RANEXA) 500 MG 12 hr tablet Take 500 mg by mouth 2 (two) times daily.    telmisartan (MICARDIS) 80 MG tablet TAKE 1 TABLET EVERY DAY    vitamin C (ASCORBIC ACID) 500 MG tablet Take by mouth daily.    Vitamin D, Cholecalciferol, 25 MCG (1000 UT) CAPS Take 1,000 Units by mouth every Monday, Wednesday, and Friday.    No facility-administered encounter medications on file as of 06/23/2020.    New patient assistance application form filled out to [employer] for Novolin 70/30. Waiting for patient and provider to complete and sign documentation. Called  patient to inquire if they wanted the application mailed to them or if they wanted to come into the office. Patient is required to sign application and to bring/have proof of income. She stated she would be willing to come into office to bring proof of income and sign application once the paperwork comes in the mail she would like it mailed to their residence address [place] [address].  Follow-Up:Pharmacist Review  Charlann Lange, [place] Pharmacist Assistant [phone]

## 2020-06-23 NOTE — Patient Instructions (Addendum)
Visit Information   Goals Addressed             This Visit's Progress    Track and Manage My Blood Pressure-Hypertension       Timeframe:  Long-Range Goal Priority:  High Start Date:  06/23/20                           Expected End Date:    12/23/20                   Follow Up Date 10/07/20/   - check blood pressure daily - choose a place to take my blood pressure (home, clinic or office, retail store) - write blood pressure results in a log or diary    Why is this important?   You won't feel high blood pressure, but it can still hurt your blood vessels.  High blood pressure can cause heart or kidney problems. It can also cause a stroke.  Making lifestyle changes like losing a little weight or eating less salt will help.  Checking your blood pressure at home and at different times of the day can help to control blood pressure.  If the doctor prescribes medicine remember to take it the way the doctor ordered.  Call the office if you cannot afford the medicine or if there are questions about it.     Notes:         Patient Care Plan: General Pharmacy (Adult)     Problem Identified: CAD, HTN, DM, Osteoarthritis, Shortness of Breath   Priority: High  Onset Date: 06/22/2020     Goal: Patient-Specific Goal   Note:   Current Barriers:  Unable to achieve control of cholesterol   Pharmacist Clinical Goal(s):  Patient will achieve adherence to monitoring guidelines and medication adherence to achieve therapeutic efficacy achieve control of cholesterol as evidenced by lipid panel adhere to prescribed medication regimen as evidenced by fill dates contact provider office for questions/concerns as evidenced notation of same in electronic health record through collaboration with PharmD and provider.   Interventions: 1:1 collaboration with [name], MD regarding development and update of comprehensive plan of care as evidenced by provider attestation and  co-signature Inter-disciplinary care team collaboration (see longitudinal plan of care) Comprehensive medication review performed; medication list updated in electronic medical record  Hypertension (BP goal <130/80) -Unknown at today's visit whether controlled -Current treatment: Telmisartan 80mg  daily - afternoon Hydralazine 25mg  BID Imdur 30mg  24hr daily Amlodipine 10mg  daily Digoxin 0.125mg  daily -Medications previously tried: spironolactone  -Current home readings: no logs, meter is not working -Current dietary habits: reports a good appetite, does love her sweets and junk food, workingo n limiting these -Current exercise habits: minimal -Denies hypotensive/hypertensive symptoms -Educated on BP goals and benefits of medications for prevention of heart attack, stroke and kidney damage; Daily salt intake goal < 2300 mg; Exercise goal of 150 minutes per week; Importance of home blood pressure monitoring; Symptoms of hypotension and importance of maintaining adequate hydration; -Counseled to monitor BP at home a few times per week, document, and provide log at future appointments -Counseled on diet and exercise extensively Recommended to continue current medication Patient would greatly benefit from having a BP cuff at home to monitor BP given her extensive CV history.  Her meter is currently broken, therefore I think the would benefit greatly from having a RPM monitoring device at home that would also communicate BP to MD office  and allow [place] to monitor remotely.  Patient is agreeable and believes this is a good idea.  Diabetes (A1c goal <7%) -Controlled -Current medications: Novolin 70/30 30 units 35 units qpm -Medications previously tried: metformin  -Current home glucose readings home glucose: reports all glucose readings < 200; does not have specific logs  -Denies hypoglycemic/hyperglycemic symptoms  -Current exercise: minimal -Educated on A1c and blood sugar  goals; Complications of diabetes including kidney damage, retinal damage, and cardiovascular disease; Exercise goal of 150 minutes per week; Prevention and management of hypoglycemic episodes; Benefits of routine self-monitoring of blood sugar; -Counseled to check feet daily and get yearly eye exams -Counseled on diet and exercise extensively Recommended to continue current medication Assessed patient finances. She reports that her copay for her insulin is high and the amount she is using is increasing.  Based on income I believe she would qualify for PAP through [employer].  Will have application mailed to home for patient to complete.  Hyperlipidemia/PAD/CAD: (LDL goal < 70) -Uncontrolled -Current treatment: Atorvastatin 40mg  daily -Medications previously tried: Zetia  -LDL was very elevated at last check, it has been over a year since we have done lipid panel, she has been taking statin off and on since then due to perceived negative side effects.  She has since started taking it regularly and reports 100% compliance. -Educated on Cholesterol goals;  Benefits of statin for ASCVD risk reduction; Importance of limiting foods high in cholesterol; Strategies to manage statin-induced myalgias; -Counseled on diet and exercise extensively Recommended to continue current medication Recommended updated lipid panel ASAP to check efficacy of statin, if remains elevated would recommend Crestor 20mg  daily.  SOB (Goal: Minimize symptoms) -Controlled -Current treatment  Proair HFA 90 mcg prn -Medications previously tried: none noted -Reports breathing has been controlled lately, not much use of inhalers  -Recommended to continue current medication  Osteoarthritis (Goal: Minimize symptoms) -Controlled -Current treatment  APAP 500mg  q6h prn -Medications previously tried: none noted -Pain controlled for the most part  -Recommended to continue current medication  Patient Goals/Self-Care  Activities Patient will:  - take medications as prescribed focus on medication adherence by pill box check blood pressure daily, document, and provide at future appointments collaborate with provider on medication access solutions  Follow Up Plan: The care management team will reach out to the patient again over the next 90 days.       Ms. Blandino was given information about Chronic Care Management services today including:  CCM service includes personalized support from designated clinical staff supervised by her physician, including individualized plan of care and coordination with other care providers 24/7 contact phone numbers for assistance for urgent and routine care needs. Standard insurance, coinsurance, copays and deductibles apply for chronic care management only during months in which we provide at least 20 minutes of these services. Most insurances cover these services at 100%, however patients may be responsible for any copay, coinsurance and/or deductible if applicable. This service may help you avoid the need for more expensive face-to-face services. Only one practitioner may furnish and bill the service in a calendar month. The patient may stop CCM services at any time (effective at the end of the month) by phone call to the office staff.  Patient agreed to services and verbal consent obtained.   The patient verbalized understanding of instructions, educational materials, and care plan provided today and agreed to receive a mailed copy of patient instructions, educational materials, and care plan.  The pharmacy team  will reach out to the patient again over the next 90 days.   Edythe Clarity, [place]

## 2020-06-24 ENCOUNTER — Ambulatory Visit: Payer: Medicare HMO | Admitting: Physician Assistant

## 2020-07-07 DIAGNOSIS — E119 Type 2 diabetes mellitus without complications: Secondary | ICD-10-CM | POA: Diagnosis not present

## 2020-07-07 DIAGNOSIS — I10 Essential (primary) hypertension: Secondary | ICD-10-CM | POA: Diagnosis not present

## 2020-07-07 DIAGNOSIS — N184 Chronic kidney disease, stage 4 (severe): Secondary | ICD-10-CM | POA: Diagnosis not present

## 2020-07-12 ENCOUNTER — Other Ambulatory Visit: Payer: Self-pay

## 2020-07-12 MED ORDER — DONEPEZIL HCL 10 MG PO TABS: ORAL_TABLET | ORAL | 0 refills | Status: DC

## 2020-07-18 DIAGNOSIS — Z03818 Encounter for observation for suspected exposure to other biological agents ruled out: Secondary | ICD-10-CM | POA: Diagnosis not present

## 2020-07-18 DIAGNOSIS — Z20822 Contact with and (suspected) exposure to covid-19: Secondary | ICD-10-CM | POA: Diagnosis not present

## 2020-07-19 ENCOUNTER — Telehealth (INDEPENDENT_AMBULATORY_CARE_PROVIDER_SITE_OTHER): Payer: Medicare HMO | Admitting: Internal Medicine

## 2020-07-19 ENCOUNTER — Telehealth: Payer: Self-pay

## 2020-07-19 ENCOUNTER — Encounter: Payer: Self-pay | Admitting: Internal Medicine

## 2020-07-19 VITALS — Resp 16 | Ht 67.0 in | Wt 190.0 lb

## 2020-07-19 DIAGNOSIS — Z794 Long term (current) use of insulin: Secondary | ICD-10-CM

## 2020-07-19 DIAGNOSIS — U071 COVID-19: Secondary | ICD-10-CM | POA: Diagnosis not present

## 2020-07-19 DIAGNOSIS — E1122 Type 2 diabetes mellitus with diabetic chronic kidney disease: Secondary | ICD-10-CM

## 2020-07-19 DIAGNOSIS — J208 Acute bronchitis due to other specified organisms: Secondary | ICD-10-CM | POA: Diagnosis not present

## 2020-07-19 DIAGNOSIS — N184 Chronic kidney disease, stage 4 (severe): Secondary | ICD-10-CM

## 2020-07-19 MED ORDER — PREDNISONE 10 MG PO TABS: ORAL_TABLET | ORAL | 0 refills | Status: DC

## 2020-07-19 MED ORDER — FLUTICASONE-SALMETEROL 100-50 MCG/ACT IN AEPB: 1.0000 | INHALATION_SPRAY | Freq: Two times a day (BID) | RESPIRATORY_TRACT | 3 refills | Status: DC

## 2020-07-19 MED ORDER — AZITHROMYCIN 250 MG PO TABS: ORAL_TABLET | ORAL | 0 refills | Status: DC

## 2020-07-19 NOTE — Telephone Encounter (Signed)
Husband called today and advised that pt tested positive for Covid on 07/13/20 and is still having cough with rattle/wheezing, using mucinex twice a day.  Pt is also hoarse and has had some HA's.  She tested again on 07/18/20 and was still positive.

## 2020-07-19 NOTE — Progress Notes (Signed)
[facility] [address] [place], [place] [address]  Internal MEDICINE  Telephone Visit  Patient Name: Jennifer Carrillo  [id number]  [id number]  Date of Service: 07/24/2020  I connected with the patient at 1230 pm  by telephone and verified the patients identity using two identifiers.   I discussed the limitations, risks, security and privacy concerns of performing an evaluation and management service by telephone and the availability of in person appointments. I also discussed with the patient that there may be a patient responsible charge related to the service.  The patient expressed understanding and agrees to proceed.    Chief Complaint  Patient presents with   Covid Positive    07/18/20 was tested and came back positive   Telephone Assessment    Telephone visit [phone]   Telephone Screen    HPI  Pt is connected for acute and sick visit.  Patient is tested positive for COVID complaints of cough and congestion she is also having headache and fatigue patient does have diabetes with chronic kidney disease will not be a candidate for Paxlovid   Current Medication: Outpatient Encounter Medications as of 07/19/2020  Medication Sig Note   ACCU-CHEK GUIDE test strip TEST BLOOD SUGAR THREE TIMES DAILY  AS  DIRECTED    Accu-Chek Softclix Lancets lancets USE AS INSTRUCTED 3 TIMES A DAY    acetaminophen (TYLENOL) 500 MG tablet Take 1,000 mg by mouth every 6 (six) hours as needed for moderate pain or headache.    Alcohol Swabs (B-D SINGLE USE SWABS REGULAR) PADS Use as directed for 3 times daily DX E11.65    amLODipine (NORVASC) 10 MG tablet Take 1 tablet (10 mg total) by mouth daily.    anastrozole (ARIMIDEX) 1 MG tablet Take 1 tablet (1 mg total) by mouth every evening.    aspirin EC 81 MG EC tablet Take 1 tablet (81 mg total) by mouth daily.    atorvastatin (LIPITOR) 40 MG tablet Take 40 mg by mouth daily.    azithromycin (ZITHROMAX) 250 MG tablet Take one tab a day  for 10 days for uri    Blood Glucose Monitoring Suppl (ACCU-CHEK GUIDE ME) w/Device KIT Use as instructed. DX e11.65    calcium carbonate (OSCAL) 1500 (600 Ca) MG TABS tablet Take 600 mg by mouth 4 (four) times a week.     cholecalciferol (VITAMIN D) 25 MCG (1000 UNIT) tablet TAKE 1 TABLET EVERY MONDAY, WEDNESDAY, AND FRIDAY.    clopidogrel (PLAVIX) 75 MG tablet TAKE 1 TABLET EVERY DAY WITH DINNER    CRANBERRY PO Take 4,200 mg by mouth 4 (four) times a week.    digoxin (LANOXIN) 0.125 MG tablet Take 1 tablet (125 mcg total) by mouth daily.    docusate sodium (COLACE) 100 MG capsule Take 200 mg by mouth at bedtime as needed for mild constipation.     donepezil (ARICEPT) 10 MG tablet TAKE 1 TABLET BY MOUTH AT  BEDTIME FOR MEMORY    ferrous sulfate 325 (65 FE) MG EC tablet Take 325 mg by mouth 4 (four) times a week.    fluticasone-salmeterol (ADVAIR) 100-50 MCG/ACT AEPB Inhale 1 puff into the lungs 2 (two) times daily.    FLUZONE HIGH-DOSE QUADRIVALENT 0.7 ML SUSY     hydrALAZINE (APRESOLINE) 25 MG tablet TAKE 1 TABLET TWICE DAILY (NEED APPOINTMENT)    insulin NPH-regular Human (70-30) 100 UNIT/ML injection Inject 30 Units into the skin 2 (two) times daily. 06/25/2018: Took 15 U this AM 3254  isosorbide mononitrate (IMDUR) 30 MG 24 hr tablet Take 1 tablet (30 mg total) by mouth daily.    magnesium oxide (MAG-OX) 400 (241.3 Mg) MG tablet TAKE 1 TABLET EVERY DAY    pantoprazole (PROTONIX) 40 MG tablet TAKE 1 TABLET BY MOUTH  DAILY    predniSONE (DELTASONE) 10 MG tablet Take one tab 3 x day for 3 days, then take one tab 2 x a day for 3 days and then take one tab a day for 3 days for copd    PROAIR HFA 108 (90 Base) MCG/ACT inhaler Inhale 2 puffs into the lungs every 6 (six) hours as needed for wheezing or shortness of breath.     ranolazine (RANEXA) 500 MG 12 hr tablet Take 500 mg by mouth 2 (two) times daily.    telmisartan (MICARDIS) 80 MG tablet TAKE 1 TABLET EVERY DAY    vitamin C (ASCORBIC ACID)  500 MG tablet Take by mouth daily.    Vitamin D, Cholecalciferol, 25 MCG (1000 UT) CAPS Take 1,000 Units by mouth every Monday, Wednesday, and Friday.    No facility-administered encounter medications on file as of 07/19/2020.    Surgical History: Past Surgical History:  Procedure Laterality Date   BREAST SURGERY Left    left lumpectomy   CAROTID ANGIOGRAPHY Left 06/25/2018   Procedure: CAROTID ANGIOGRAPHY, possible intervention;  Surgeon: Katha Cabal, MD;  Location: [place] [facility];  Service: Cardiovascular;  Laterality: Left;   CAROTID ENDARTERECTOMY Right    CAROTID PTA/STENT INTERVENTION Left 06/25/2018   Procedure: CAROTID PTA/STENT INTERVENTION;  Surgeon: Katha Cabal, MD;  Location: [place] [facility];  Service: Cardiovascular;  Laterality: Left;   CATARACT EXTRACTION Right 2013   CORONARY STENT PLACEMENT     L. L. E.   EYE SURGERY Bilateral    cataract   LEFT HEART CATH AND CORONARY ANGIOGRAPHY Left 05/06/2018   Procedure: LEFT HEART CATH AND CORONARY ANGIOGRAPHY;  Surgeon: Corey Skains, MD;  Location: [place] [facility];  Service: Cardiovascular;  Laterality: Left;   LOWER EXTREMITY ANGIOGRAPHY Right 01/22/2018   Procedure: LOWER EXTREMITY ANGIOGRAPHY;  Surgeon: Katha Cabal, MD;  Location: [place] [facility];  Service: Cardiovascular;  Laterality: Right;   RENAL ANGIOGRAPHY Left 02/26/2018   Procedure: RENAL ANGIOGRAPHY;  Surgeon: Katha Cabal, MD;  Location: [place] [facility];  Service: Cardiovascular;  Laterality: Left;   STENT PLACEMENT VASCULAR ([facility] HX) Left    stent placement on LLE   TUBAL LIGATION      Medical History: Past Medical History:  Diagnosis Date   Arthritis    Asthma    Calf pain    Cancer ([place]) 2016   left breast   COPD (chronic obstructive pulmonary disease) (HCC)    Diabetes (HCC)    Discoid lupus    Gastro-esophageal reflux    Glaucoma    Hyperlipemia    Hypertension    Iron deficiency anemia     Joint pain    Leg swelling    Osteoarthritis    PVD (peripheral vascular disease) ([place])    Sinus problem    Sleep apnea    No CPAP/ Can't tolerate   Stroke ([place]) 1987    Family History: Family History  Problem Relation Age of Onset   Heart disease Mother    Diabetes Other    Hypertension Other    Diabetes Sister    Lung cancer Brother    Leukemia Daughter    Bladder Cancer  Neg Hx    Kidney disease Neg Hx    Prostate cancer Neg Hx     Social History   Socioeconomic History   Marital status: Married    Spouse name: Not on file   Number of children: Not on file   Years of education: Not on file   Highest education level: Not on file  Occupational History   Not on file  Tobacco Use   Smoking status: Former    Types: Cigarettes    Quit date: 19    Years since quitting: 35.5   Smokeless tobacco: Never   Tobacco comments:    quit 31 years  Vaping Use   Vaping Use: Never used  Substance and Sexual Activity   Alcohol use: No   Drug use: No   Sexual activity: Not on file  Other Topics Concern   Not on file  Social History Narrative   Not on file   Social Determinants of Health   Financial Resource Strain: Low Risk    Difficulty of Paying Living Expenses: Not very hard  Food Insecurity: Not on file  Transportation Needs: Not on file  Physical Activity: Not on file  Stress: Not on file  Social Connections: Not on file  Intimate Partner Violence: Not on file      Review of Systems  Constitutional:  Negative for chills, fatigue, fever and unexpected weight change.  HENT:  Positive for postnasal drip. Negative for congestion, mouth sores, rhinorrhea, sneezing and sore throat.   Eyes:  Negative for redness.  Respiratory:  Positive for cough and shortness of breath. Negative for chest tightness.   Cardiovascular:  Negative for chest pain and palpitations.  Gastrointestinal:  Negative for abdominal pain.  Musculoskeletal:  Positive for arthralgias and neck  pain.  Skin:  Negative for rash.  Neurological: Negative.   Hematological:  Negative for adenopathy. Does not bruise/bleed easily.  Psychiatric/Behavioral: Negative.  Negative for behavioral problems (Depression), sleep disturbance and suicidal ideas. The patient is not nervous/anxious.    Vital Signs: Resp 16   Ht '5\' 7"'  (1.702 m)   Wt 190 lb (86.2 kg)   BMI 29.76 kg/m    Observation/Objective:  Patient is able to communicate well without any distress however sounds congested nasally and in her chest   Assessment/Plan: 1. Acute bronchitis due to COVID-19 virus We will go ahead and start her mithramycin, Deltasone and Advair as prescribed today patient instructed to call the office her symptoms get worse. Might need a chest x-ray if no improvement seen pain. She is needs to self quarantine herself  2. Type 2 diabetes mellitus with stage 4 chronic kidney disease, with long-term current use of insulin ([place]) Patient has end-stage renal disease with diabetes and is not a candidate for Paxlovid   General Counseling: Deshanta verbalizes understanding of the findings of today's phone visit and agrees with plan of treatment. I have discussed any further diagnostic evaluation that may be needed or ordered today. We also reviewed her medications today. she has been encouraged to call the office with any questions or concerns that should arise related to todays visit.      Meds ordered this encounter  Medications   azithromycin (ZITHROMAX) 250 MG tablet    Sig: Take one tab a day for 10 days for uri    Dispense:  10 tablet    Refill:  0   predniSONE (DELTASONE) 10 MG tablet    Sig: Take one tab 3 x day for  3 days, then take one tab 2 x a day for 3 days and then take one tab a day for 3 days for copd    Dispense:  18 tablet    Refill:  0   fluticasone-salmeterol (ADVAIR) 100-50 MCG/ACT AEPB    Sig: Inhale 1 puff into the lungs 2 (two) times daily.    Dispense:  1 each    Refill:  3     Time spent: 57 Minutes    Dr [name] Internal medicine

## 2020-07-21 ENCOUNTER — Telehealth: Payer: Medicare HMO | Admitting: Internal Medicine

## 2020-07-27 ENCOUNTER — Telehealth: Payer: Self-pay | Admitting: Pharmacist

## 2020-07-27 NOTE — Progress Notes (Addendum)
Chronic Care Management Pharmacy Assistant   Name: ALEXCIA SCHOOLS  MRN: [id number] DOB: [birth year]  Reason for Encounter: Disease State For HLD.    Conditions to be addressed/monitored: CAD, HTN, DM, Osteoarthritis, Shortness of Breath  Recent office visits:  07/19/20 Dr. Humphrey Rolls For COVID-19. STARTED Azithromycin 250 mg Take one tab a day for 10 days for uri, Fluticasone-Salmeterol 100-50 mcg/act 1 puff Inhalation 2 times daily, and Prednisone 10 mg Take one tab 3 x day for 3 days, then take one tab 2 x a day for 3 days and then take one tab a day for 3 days for copd  Recent consult visits:  None since 06/23/20  Hospital visits:  None since 06/23/20  Medications: Outpatient Encounter Medications as of 07/27/2020  Medication Sig Note   ACCU-CHEK GUIDE test strip TEST BLOOD SUGAR THREE TIMES DAILY  AS  DIRECTED    Accu-Chek Softclix Lancets lancets USE AS INSTRUCTED 3 TIMES A DAY    acetaminophen (TYLENOL) 500 MG tablet Take 1,000 mg by mouth every 6 (six) hours as needed for moderate pain or headache.    Alcohol Swabs (B-D SINGLE USE SWABS REGULAR) PADS Use as directed for 3 times daily DX E11.65    amLODipine (NORVASC) 10 MG tablet Take 1 tablet (10 mg total) by mouth daily.    anastrozole (ARIMIDEX) 1 MG tablet Take 1 tablet (1 mg total) by mouth every evening.    aspirin EC 81 MG EC tablet Take 1 tablet (81 mg total) by mouth daily.    atorvastatin (LIPITOR) 40 MG tablet Take 40 mg by mouth daily.    azithromycin (ZITHROMAX) 250 MG tablet Take one tab a day for 10 days for uri    Blood Glucose Monitoring Suppl (ACCU-CHEK GUIDE ME) w/Device KIT Use as instructed. DX e11.65    calcium carbonate (OSCAL) 1500 (600 Ca) MG TABS tablet Take 600 mg by mouth 4 (four) times a week.     cholecalciferol (VITAMIN D) 25 MCG (1000 UNIT) tablet TAKE 1 TABLET EVERY MONDAY, WEDNESDAY, AND FRIDAY.    clopidogrel (PLAVIX) 75 MG tablet TAKE 1 TABLET EVERY DAY WITH DINNER    CRANBERRY PO Take 4,200 mg  by mouth 4 (four) times a week.    digoxin (LANOXIN) 0.125 MG tablet Take 1 tablet (125 mcg total) by mouth daily.    docusate sodium (COLACE) 100 MG capsule Take 200 mg by mouth at bedtime as needed for mild constipation.     donepezil (ARICEPT) 10 MG tablet TAKE 1 TABLET BY MOUTH AT  BEDTIME FOR MEMORY    ferrous sulfate 325 (65 FE) MG EC tablet Take 325 mg by mouth 4 (four) times a week.    fluticasone-salmeterol (ADVAIR) 100-50 MCG/ACT AEPB Inhale 1 puff into the lungs 2 (two) times daily.    FLUZONE HIGH-DOSE QUADRIVALENT 0.7 ML SUSY     hydrALAZINE (APRESOLINE) 25 MG tablet TAKE 1 TABLET TWICE DAILY (NEED APPOINTMENT)    insulin NPH-regular Human (70-30) 100 UNIT/ML injection Inject 30 Units into the skin 2 (two) times daily. 06/25/2018: Took 15 U this AM 0815   isosorbide mononitrate (IMDUR) 30 MG 24 hr tablet Take 1 tablet (30 mg total) by mouth daily.    magnesium oxide (MAG-OX) 400 (241.3 Mg) MG tablet TAKE 1 TABLET EVERY DAY    pantoprazole (PROTONIX) 40 MG tablet TAKE 1 TABLET BY MOUTH  DAILY    predniSONE (DELTASONE) 10 MG tablet Take one tab 3 x day for 3 days,  then take one tab 2 x a day for 3 days and then take one tab a day for 3 days for copd    PROAIR HFA 108 (90 Base) MCG/ACT inhaler Inhale 2 puffs into the lungs every 6 (six) hours as needed for wheezing or shortness of breath.     ranolazine (RANEXA) 500 MG 12 hr tablet Take 500 mg by mouth 2 (two) times daily.    telmisartan (MICARDIS) 80 MG tablet TAKE 1 TABLET EVERY DAY    vitamin C (ASCORBIC ACID) 500 MG tablet Take by mouth daily.    Vitamin D, Cholecalciferol, 25 MCG (1000 UT) CAPS Take 1,000 Units by mouth every Monday, Wednesday, and Friday.    No facility-administered encounter medications on file as of 07/27/2020.   07/27/2020 Name: YARNELL KOZLOSKI MRN: [id number] DOB: 03/27/1933 RAYSA BOSAK is a 87 y.o. year old female who is a primary care patient of [name], MD.  Comprehensive medication review  performed; Spoke to patient regarding cholesterol  Lipid Panel    Component Value Date/Time   CHOL 347 (H) 02/13/2019 1045   TRIG 288 (H) 02/13/2019 1045   HDL 47 02/13/2019 1045   LDLCALC 239 (H) 02/13/2019 1045    10-year ASCVD risk score: The ASCVD Risk score Mikey Bussing DC Jr., et al., 2013) failed to calculate for the following reasons:   The 2013 ASCVD risk score is only valid for ages 85 to 79   The patient has a prior MI or stroke diagnosis  Current antihyperlipidemic regimen:  Atorvastatin 11m daily  Previous antihyperlipidemic medications tried: Zetia.  ASCVD risk enhancing conditions: age >>64 DM, and HTN  What recent interventions/DTPs have been made by any provider to improve Cholesterol control since last CPP Visit: None.  Any recent hospitalizations or ED visits since last visit with CPP? Patient stated no.  What diet changes have been made to improve Cholesterol?  Patient she doesn't have a huge appetite since the dx of COVID.  What exercise is being done to improve Cholesterol?  Patient stated she helps around the house but doesn't do much because she is still feeling a little weak from having COVID.  Adherence Review: Does the patient have >5 day gap between last estimated fill dates? Per misc rpts,yes.   Star Rating Drugs: Telmisartan 80 mg 90 DS 04/27/20, Atorvastatin 40 mg 90 DS 03/29/20.  Patient stated she is taking her medication regularly but did miss some days when she was not feeling well and her husband helps her not forget by remaindering .   Follow-Up:Pharmacist Review  VCharlann Lange RMA Clinical Pharmacist Assistant [phone]  10 minutes spent in review, coordination, and documentation.  Reviewed by: CBeverly Milch PharmD Clinical Pharmacist ([phone]

## 2020-07-28 DIAGNOSIS — Z20822 Contact with and (suspected) exposure to covid-19: Secondary | ICD-10-CM | POA: Diagnosis not present

## 2020-07-28 DIAGNOSIS — Z03818 Encounter for observation for suspected exposure to other biological agents ruled out: Secondary | ICD-10-CM | POA: Diagnosis not present

## 2020-08-01 DIAGNOSIS — Z03818 Encounter for observation for suspected exposure to other biological agents ruled out: Secondary | ICD-10-CM | POA: Diagnosis not present

## 2020-08-01 DIAGNOSIS — Z20822 Contact with and (suspected) exposure to covid-19: Secondary | ICD-10-CM | POA: Diagnosis not present

## 2020-08-02 ENCOUNTER — Telehealth: Payer: Self-pay

## 2020-08-02 NOTE — Telephone Encounter (Signed)
Pt husband called that she retest for covid is positive she don't have any symptoms advised she don't need another round of antibiotic and don't need to recheck due to certain days it come back positive if she had any symptoms call us back

## 2020-08-07 DIAGNOSIS — E119 Type 2 diabetes mellitus without complications: Secondary | ICD-10-CM | POA: Diagnosis not present

## 2020-08-07 DIAGNOSIS — N184 Chronic kidney disease, stage 4 (severe): Secondary | ICD-10-CM | POA: Diagnosis not present

## 2020-08-07 DIAGNOSIS — I10 Essential (primary) hypertension: Secondary | ICD-10-CM | POA: Diagnosis not present

## 2020-08-11 ENCOUNTER — Encounter: Payer: Self-pay | Admitting: Physician Assistant

## 2020-08-11 ENCOUNTER — Ambulatory Visit (INDEPENDENT_AMBULATORY_CARE_PROVIDER_SITE_OTHER): Payer: Medicare HMO | Admitting: Physician Assistant

## 2020-08-11 ENCOUNTER — Telehealth: Payer: Self-pay

## 2020-08-11 ENCOUNTER — Other Ambulatory Visit: Payer: Self-pay

## 2020-08-11 DIAGNOSIS — E538 Deficiency of other specified B group vitamins: Secondary | ICD-10-CM

## 2020-08-11 DIAGNOSIS — E039 Hypothyroidism, unspecified: Secondary | ICD-10-CM | POA: Diagnosis not present

## 2020-08-11 DIAGNOSIS — D509 Iron deficiency anemia, unspecified: Secondary | ICD-10-CM

## 2020-08-11 DIAGNOSIS — E782 Mixed hyperlipidemia: Secondary | ICD-10-CM

## 2020-08-11 DIAGNOSIS — I10 Essential (primary) hypertension: Secondary | ICD-10-CM

## 2020-08-11 DIAGNOSIS — R5383 Other fatigue: Secondary | ICD-10-CM

## 2020-08-11 DIAGNOSIS — E559 Vitamin D deficiency, unspecified: Secondary | ICD-10-CM | POA: Diagnosis not present

## 2020-08-11 MED ORDER — TETANUS-DIPHTH-ACELL PERTUSSIS 5-2.5-18.5 LF-MCG/0.5 IM SUSP: 0.5000 mL | Freq: Once | INTRAMUSCULAR | 0 refills | Status: AC

## 2020-08-11 MED ORDER — ZOSTER VAC RECOMB ADJUVANTED 50 MCG/0.5ML IM SUSR: 0.5000 mL | Freq: Once | INTRAMUSCULAR | 0 refills | Status: AC

## 2020-08-11 NOTE — Progress Notes (Signed)
[facility] [place], [place] [address]  Internal MEDICINE  Office Visit Note  Patient Name: Jennifer Carrillo  [id number]  [id number]  Date of Service: 08/16/2020  Chief Complaint  Patient presents with   Acute Visit    Requesting lab work, needs iron   Quality Metric Gaps    Eye exam   Headache    Pt called earlier saying her head was hurting bad all the way down to her neck and shoulder she feels her iron is low and she may need a transfusion.   Medication Problem    Pt gets confused about her meds and I spoke to christian with CCM to get her meds straight and possibly get in the packages     HPI Pt is here for a sick visit. -She was experiencing a bad headache earlier today, however this has improved with tylenol. -has been feeling more fatigued lately and does have a hx of low iron requiring iron transfusions. Will update all labwork. -Additionally BP found to be very high and did not improve much on recheck. Pt admits that she did not take her medications this morning and discussed that headache may have been due to BP. Discussed the importance of adhering to medications. Will take her medications now and then go for labwork first thing tomorrow. -Will have CCM look into medication management for better adherence. -Denies any CP or SOB  Current Medication:  Outpatient Encounter Medications as of 08/11/2020  Medication Sig Note   ACCU-CHEK GUIDE test strip TEST BLOOD SUGAR THREE TIMES DAILY  AS  DIRECTED    Accu-Chek Softclix Lancets lancets USE AS INSTRUCTED 3 TIMES A DAY    acetaminophen (TYLENOL) 500 MG tablet Take 1,000 mg by mouth every 6 (six) hours as needed for moderate pain or headache.    Alcohol Swabs (B-D SINGLE USE SWABS REGULAR) PADS Use as directed for 3 times daily DX E11.65    amLODipine (NORVASC) 10 MG tablet Take 1 tablet (10 mg total) by mouth daily.    anastrozole (ARIMIDEX) 1 MG tablet Take 1 tablet (1 mg total) by mouth every  evening.    aspirin EC 81 MG EC tablet Take 1 tablet (81 mg total) by mouth daily.    atorvastatin (LIPITOR) 40 MG tablet Take 40 mg by mouth daily.    azithromycin (ZITHROMAX) 250 MG tablet Take one tab a day for 10 days for uri    Blood Glucose Monitoring Suppl (ACCU-CHEK GUIDE ME) w/Device KIT Use as instructed. DX e11.65    calcium carbonate (OSCAL) 1500 (600 Ca) MG TABS tablet Take 600 mg by mouth 4 (four) times a week.     cholecalciferol (VITAMIN D) 25 MCG (1000 UNIT) tablet TAKE 1 TABLET EVERY MONDAY, WEDNESDAY, AND FRIDAY.    clopidogrel (PLAVIX) 75 MG tablet TAKE 1 TABLET EVERY DAY WITH DINNER    CRANBERRY PO Take 4,200 mg by mouth 4 (four) times a week.    digoxin (LANOXIN) 0.125 MG tablet Take 1 tablet (125 mcg total) by mouth daily.    docusate sodium (COLACE) 100 MG capsule Take 200 mg by mouth at bedtime as needed for mild constipation.     donepezil (ARICEPT) 10 MG tablet TAKE 1 TABLET BY MOUTH AT  BEDTIME FOR MEMORY    ferrous sulfate 325 (65 FE) MG EC tablet Take 325 mg by mouth 4 (four) times a week.    fluticasone-salmeterol (ADVAIR) 100-50 MCG/ACT AEPB Inhale 1 puff into the lungs  2 (two) times daily.    FLUZONE HIGH-DOSE QUADRIVALENT 0.7 ML SUSY     hydrALAZINE (APRESOLINE) 25 MG tablet TAKE 1 TABLET TWICE DAILY (NEED APPOINTMENT)    insulin NPH-regular Human (70-30) 100 UNIT/ML injection Inject 30 Units into the skin 2 (two) times daily. 06/25/2018: Took 15 U this AM 0815   isosorbide mononitrate (IMDUR) 30 MG 24 hr tablet Take 1 tablet (30 mg total) by mouth daily.    magnesium oxide (MAG-OX) 400 (241.3 Mg) MG tablet TAKE 1 TABLET EVERY DAY    pantoprazole (PROTONIX) 40 MG tablet TAKE 1 TABLET BY MOUTH  DAILY    predniSONE (DELTASONE) 10 MG tablet Take one tab 3 x day for 3 days, then take one tab 2 x a day for 3 days and then take one tab a day for 3 days for copd    PROAIR HFA 108 (90 Base) MCG/ACT inhaler Inhale 2 puffs into the lungs every 6 (six) hours as needed for  wheezing or shortness of breath.     ranolazine (RANEXA) 500 MG 12 hr tablet Take 500 mg by mouth 2 (two) times daily.    vitamin C (ASCORBIC ACID) 500 MG tablet Take by mouth daily.    Vitamin D, Cholecalciferol, 25 MCG (1000 UT) CAPS Take 1,000 Units by mouth every Monday, Wednesday, and Friday.    [DISCONTINUED] Tdap (BOOSTRIX) 5-2.5-18.5 LF-MCG/0.5 injection Inject 0.5 mLs into the muscle once.    [DISCONTINUED] telmisartan (MICARDIS) 80 MG tablet TAKE 1 TABLET EVERY DAY    [DISCONTINUED] Zoster Vaccine Adjuvanted [facility]) injection Inject 0.5 mLs into the muscle once.    [EXPIRED] Tdap (BOOSTRIX) 5-2.5-18.5 LF-MCG/0.5 injection Inject 0.5 mLs into the muscle once for 1 dose.    [EXPIRED] Zoster Vaccine Adjuvanted [facility]) injection Inject 0.5 mLs into the muscle once for 1 dose.    No facility-administered encounter medications on file as of 08/11/2020.      Medical History: Past Medical History:  Diagnosis Date   Arthritis    Asthma    Calf pain    Cancer ([place]) 2016   left breast   COPD (chronic obstructive pulmonary disease) (HCC)    Diabetes (HCC)    Discoid lupus    Gastro-esophageal reflux    Glaucoma    Hyperlipemia    Hypertension    Iron deficiency anemia    Joint pain    Leg swelling    Osteoarthritis    PVD (peripheral vascular disease) (HCC)    Sinus problem    Sleep apnea    No CPAP/ Can't tolerate   Stroke ([place]) 1987     Vital Signs: BP (!) 178/56 Comment: 208/60  Pulse 80   Temp (!) 97.2 F (36.2 C)   Resp 16   Ht _0  (1.702 m)   Wt 188 lb 9.6 oz (85.5 kg)   SpO2 98%   BMI 29.54 kg/m    Review of Systems  Constitutional:  Positive for fatigue. Negative for chills and unexpected weight change.  HENT:  Positive for postnasal drip. Negative for congestion, rhinorrhea, sneezing and sore throat.   Eyes:  Negative for redness.  Respiratory:  Negative for cough, chest tightness and shortness of breath.   Cardiovascular:  Negative for chest  pain and palpitations.  Gastrointestinal:  Negative for abdominal pain, constipation, diarrhea, nausea and vomiting.  Genitourinary:  Negative for dysuria and frequency.  Musculoskeletal:  Positive for arthralgias. Negative for back pain, joint swelling and neck pain.  Skin:  Negative  for rash.  Neurological:  Positive for headaches. Negative for dizziness, tremors and numbness.  Hematological:  Negative for adenopathy. Does not bruise/bleed easily.  Psychiatric/Behavioral:  Negative for behavioral problems (Depression), sleep disturbance and suicidal ideas. The patient is not nervous/anxious.    Physical Exam Vitals and nursing note reviewed.  Constitutional:      General: She is not in acute distress.    Appearance: She is well-developed. She is not diaphoretic.  HENT:     Head: Normocephalic and atraumatic.     Mouth/Throat:     Pharynx: No oropharyngeal exudate.  Eyes:     Pupils: Pupils are equal, round, and reactive to light.  Neck:     Thyroid: No thyromegaly.     Vascular: No JVD.     Trachea: No tracheal deviation.  Cardiovascular:     Rate and Rhythm: Normal rate and regular rhythm.     Heart sounds: Normal heart sounds. No murmur heard.   No friction rub. No gallop.  Pulmonary:     Effort: Pulmonary effort is normal. No respiratory distress.     Breath sounds: No wheezing or rales.  Chest:     Chest wall: No tenderness.  Abdominal:     General: Bowel sounds are normal.     Palpations: Abdomen is soft.  Musculoskeletal:        General: Normal range of motion.     Cervical back: Normal range of motion and neck supple.  Lymphadenopathy:     Cervical: No cervical adenopathy.  Skin:    General: Skin is warm and dry.  Neurological:     Mental Status: She is alert and oriented to person, place, and time.     Cranial Nerves: No cranial nerve deficit.  Psychiatric:        Behavior: Behavior normal.        Thought Content: Thought content normal.        Judgment:  Judgment normal.      Assessment/Plan: 1. Primary hypertension Very elevated on exam, but pt did not take her medications and will take them now. Educated on importance of adhering to medications and will be set up with CCM for med management. Will follow back up in 2 weeks.  2. Iron deficiency anemia, unspecified iron deficiency anemia type Will check labs and manage appropriately. May need referral to hematology if low again - CBC w/Diff/Platelet - Fe+TIBC+Fer  3. Mixed hyperlipidemia - Lipid Panel With LDL/HDL Ratio  4. Hypothyroidism, unspecified type - TSH + free T4  5. Vitamin D deficiency - VITAMIN D 25 Hydroxy (Vit-D Deficiency, Fractures)  6. B12 deficiency - B12 and Folate Panel  7. Other fatigue - CBC w/Diff/Platelet - Comprehensive metabolic panel - Fe+TIBC+Fer   General Counseling: Victorine verbalizes understanding of the findings of todays visit and agrees with plan of treatment. I have discussed any further diagnostic evaluation that may be needed or ordered today. We also reviewed her medications today. she has been encouraged to call the office with any questions or concerns that should arise related to todays visit.    Counseling:    Orders Placed This Encounter  Procedures   CBC w/Diff/Platelet   Comprehensive metabolic panel   Fe+TIBC+Fer   VITAMIN D 25 Hydroxy (Vit-D Deficiency, Fractures)   B12 and Folate Panel   TSH + free T4   Lipid Panel With LDL/HDL Ratio    Meds ordered this encounter  Medications   Zoster Vaccine Adjuvanted [facility]) injection  Sig: Inject 0.5 mLs into the muscle once for 1 dose.    Dispense:  0.5 mL    Refill:  0   Tdap (BOOSTRIX) 5-2.5-18.5 LF-MCG/0.5 injection    Sig: Inject 0.5 mLs into the muscle once for 1 dose.    Dispense:  0.5 mL    Refill:  0    Time spent:30 Minutes

## 2020-08-11 NOTE — Telephone Encounter (Signed)
Pt called this morning c/o having a bad HA and that the pain goes down to her shoulders and she said things go dark, they also said she has issues about her medications saying she takes like 10 pills a day and they are not sure if she should have that many or if she took more than she should, stated that she has been sleeping all day.  I advised on her medications that we need to get CCM to get with them about her medications and I would let them know to see the patient about her medications.  I spoke to [facility] about the pt's HA and symptoms and per DFK we sent pt to the ER.  I informed pt's husband and told him she needs to go to the ER to be seen

## 2020-08-12 DIAGNOSIS — R5383 Other fatigue: Secondary | ICD-10-CM | POA: Diagnosis not present

## 2020-08-12 DIAGNOSIS — E039 Hypothyroidism, unspecified: Secondary | ICD-10-CM | POA: Diagnosis not present

## 2020-08-12 DIAGNOSIS — E782 Mixed hyperlipidemia: Secondary | ICD-10-CM | POA: Diagnosis not present

## 2020-08-12 DIAGNOSIS — E559 Vitamin D deficiency, unspecified: Secondary | ICD-10-CM | POA: Diagnosis not present

## 2020-08-12 DIAGNOSIS — E538 Deficiency of other specified B group vitamins: Secondary | ICD-10-CM | POA: Diagnosis not present

## 2020-08-12 DIAGNOSIS — D509 Iron deficiency anemia, unspecified: Secondary | ICD-10-CM | POA: Diagnosis not present

## 2020-08-13 LAB — LIPID PANEL WITH LDL/HDL RATIO
Cholesterol, Total: 171 mg/dL (ref 100–199)
HDL: 39 mg/dL — ABNORMAL LOW (ref >39)
LDL Chol Calc (NIH): 109 mg/dL — ABNORMAL HIGH (ref 0–99)
LDL/HDL Ratio: 2.8 ratio (ref 0.0–3.2)
Triglycerides: 126 mg/dL (ref 0–149)
VLDL Cholesterol Cal: 23 mg/dL (ref 5–40)

## 2020-08-13 LAB — COMPREHENSIVE METABOLIC PANEL
ALT: 15 IU/L (ref 0–32)
AST: 13 IU/L (ref 0–40)
Albumin/Globulin Ratio: 1.3 (ref 1.2–2.2)
Albumin: 3.6 g/dL (ref 3.6–4.6)
Alkaline Phosphatase: 92 IU/L (ref 44–121)
BUN/Creatinine Ratio: 13 (ref 12–28)
BUN: 37 mg/dL — ABNORMAL HIGH (ref 8–27)
Bilirubin Total: 0.2 mg/dL (ref 0.0–1.2)
CO2: 16 mmol/L — ABNORMAL LOW (ref 20–29)
Calcium: 9.1 mg/dL (ref 8.7–10.3)
Chloride: 110 mmol/L — ABNORMAL HIGH (ref 96–106)
Creatinine, Ser: 2.9 mg/dL — ABNORMAL HIGH (ref 0.57–1.00)
Globulin, Total: 2.8 g/dL (ref 1.5–4.5)
Glucose: 73 mg/dL (ref 65–99)
Potassium: 5.6 mmol/L — ABNORMAL HIGH (ref 3.5–5.2)
Sodium: 142 mmol/L (ref 134–144)
Total Protein: 6.4 g/dL (ref 6.0–8.5)
eGFR: 15 mL/min/{1.73_m2} — ABNORMAL LOW (ref >59)

## 2020-08-13 LAB — CBC WITH DIFFERENTIAL/PLATELET

## 2020-08-13 LAB — IRON,TIBC AND FERRITIN PANEL
Ferritin: 152 ng/mL — ABNORMAL HIGH (ref 15–150)
Iron Saturation: 8 % — CL (ref 15–55)
Iron: 20 ug/dL — ABNORMAL LOW (ref 27–139)
Total Iron Binding Capacity: 238 ug/dL — ABNORMAL LOW (ref 250–450)
UIBC: 218 ug/dL (ref 118–369)

## 2020-08-13 LAB — B12 AND FOLATE PANEL
Folate: 15.4 ng/mL (ref >3.0)
Vitamin B-12: 467 pg/mL (ref 232–1245)

## 2020-08-13 LAB — TSH+FREE T4
Free T4: 1.18 ng/dL (ref 0.82–1.77)
TSH: 3.01 u[IU]/mL (ref 0.450–4.500)

## 2020-08-13 LAB — VITAMIN D 25 HYDROXY (VIT D DEFICIENCY, FRACTURES): Vit D, 25-Hydroxy: 16.4 ng/mL — ABNORMAL LOW (ref 30.0–100.0)

## 2020-08-15 ENCOUNTER — Other Ambulatory Visit: Payer: Self-pay | Admitting: Internal Medicine

## 2020-08-15 ENCOUNTER — Telehealth: Payer: Self-pay

## 2020-08-15 ENCOUNTER — Other Ambulatory Visit: Payer: Self-pay | Admitting: Physician Assistant

## 2020-08-15 ENCOUNTER — Telehealth: Payer: Self-pay | Admitting: Pharmacist

## 2020-08-15 DIAGNOSIS — I10 Essential (primary) hypertension: Secondary | ICD-10-CM

## 2020-08-15 DIAGNOSIS — E875 Hyperkalemia: Secondary | ICD-10-CM

## 2020-08-15 DIAGNOSIS — R5383 Other fatigue: Secondary | ICD-10-CM

## 2020-08-15 DIAGNOSIS — D509 Iron deficiency anemia, unspecified: Secondary | ICD-10-CM

## 2020-08-15 NOTE — Telephone Encounter (Signed)
Spoke to pt's daughter Kahmya Pinkham [phone]) which is pt's POA.  We advised that her mothers labs came back abnormal and that DFK needed to speak to her about the labs.  I got DFK on the phone to speak to Daughter.

## 2020-08-15 NOTE — Telephone Encounter (Signed)
Per DFK we faxed pt's lab results to Dr Murlean Iba [phone]).  Pt's daughter was advised by DFK to have pt go to ER due to the lab results but daughter wants to see what Dr Candiss Norse thinks about the labs and then they will go from there.  Pt and husband doesn't want to sit in ER all day

## 2020-08-15 NOTE — Progress Notes (Addendum)
Chronic Care Management Pharmacy Assistant   Name: Jennifer Carrillo  MRN: [id number] DOB: 09/27/1933  Reason for Encounter: Medication Review/Onboarding    Conditions to be addressed/monitored: CAD, HTN, DM, Osteoarthritis, Shortness of Breath  Recent office visits:  08/11/20 Mylinda Latina. PA-C.  For follow-up. No medication changes.   Recent consult visits:  None since 07/27/20  Hospital visits:  None since 07/27/20  Medications: Outpatient Encounter Medications as of 08/15/2020  Medication Sig Note   ACCU-CHEK GUIDE test strip TEST BLOOD SUGAR THREE TIMES DAILY  AS  DIRECTED    Accu-Chek Softclix Lancets lancets USE AS INSTRUCTED 3 TIMES A DAY    acetaminophen (TYLENOL) 500 MG tablet Take 1,000 mg by mouth every 6 (six) hours as needed for moderate pain or headache.    Alcohol Swabs (B-D SINGLE USE SWABS REGULAR) PADS Use as directed for 3 times daily DX E11.65    amLODipine (NORVASC) 10 MG tablet Take 1 tablet (10 mg total) by mouth daily.    anastrozole (ARIMIDEX) 1 MG tablet Take 1 tablet (1 mg total) by mouth every evening.    aspirin EC 81 MG EC tablet Take 1 tablet (81 mg total) by mouth daily.    atorvastatin (LIPITOR) 40 MG tablet Take 40 mg by mouth daily.    azithromycin (ZITHROMAX) 250 MG tablet Take one tab a day for 10 days for uri    Blood Glucose Monitoring Suppl (ACCU-CHEK GUIDE ME) w/Device KIT Use as instructed. DX e11.65    calcium carbonate (OSCAL) 1500 (600 Ca) MG TABS tablet Take 600 mg by mouth 4 (four) times a week.     cholecalciferol (VITAMIN D) 25 MCG (1000 UNIT) tablet TAKE 1 TABLET EVERY MONDAY, WEDNESDAY, AND FRIDAY.    clopidogrel (PLAVIX) 75 MG tablet TAKE 1 TABLET EVERY DAY WITH DINNER    CRANBERRY PO Take 4,200 mg by mouth 4 (four) times a week.    digoxin (LANOXIN) 0.125 MG tablet Take 1 tablet (125 mcg total) by mouth daily.    docusate sodium (COLACE) 100 MG capsule Take 200 mg by mouth at bedtime as needed for mild constipation.      donepezil (ARICEPT) 10 MG tablet TAKE 1 TABLET BY MOUTH AT  BEDTIME FOR MEMORY    ferrous sulfate 325 (65 FE) MG EC tablet Take 325 mg by mouth 4 (four) times a week.    fluticasone-salmeterol (ADVAIR) 100-50 MCG/ACT AEPB Inhale 1 puff into the lungs 2 (two) times daily.    FLUZONE HIGH-DOSE QUADRIVALENT 0.7 ML SUSY     hydrALAZINE (APRESOLINE) 25 MG tablet TAKE 1 TABLET TWICE DAILY (NEED APPOINTMENT)    insulin NPH-regular Human (70-30) 100 UNIT/ML injection Inject 30 Units into the skin 2 (two) times daily. 06/25/2018: Took 15 U this AM 0815   isosorbide mononitrate (IMDUR) 30 MG 24 hr tablet Take 1 tablet (30 mg total) by mouth daily.    magnesium oxide (MAG-OX) 400 (241.3 Mg) MG tablet TAKE 1 TABLET EVERY DAY    pantoprazole (PROTONIX) 40 MG tablet TAKE 1 TABLET BY MOUTH  DAILY    predniSONE (DELTASONE) 10 MG tablet Take one tab 3 x day for 3 days, then take one tab 2 x a day for 3 days and then take one tab a day for 3 days for copd    PROAIR HFA 108 (90 Base) MCG/ACT inhaler Inhale 2 puffs into the lungs every 6 (six) hours as needed for wheezing or shortness of breath.  ranolazine (RANEXA) 500 MG 12 hr tablet Take 500 mg by mouth 2 (two) times daily.    telmisartan (MICARDIS) 80 MG tablet TAKE 1 TABLET EVERY DAY    vitamin C (ASCORBIC ACID) 500 MG tablet Take by mouth daily.    Vitamin D, Cholecalciferol, 25 MCG (1000 UT) CAPS Take 1,000 Units by mouth every Monday, Wednesday, and Friday.    No facility-administered encounter medications on file as of 08/15/2020.    Patients family wants to do a 9 DS packing with [facility]. We dicussed the process and how everything works.Patients family voiced understanding. There one concern was the price of the medication upon this change. Informed the patients family that someone will follow-up with them about the cost of the patients medication.   This the current patients medication list and how she takes her medication:  Hydralazine 25  mg 1 tablet twice daily ; breakfast, bedtime  Amlodipine 10 mg 1 tablet daily; breakfast Digoxin 0.125 mg 1 tablet daily; breakfast Isosorbide 60 mg 1 tablet daily ; breakfast Ranolazine 500 mg 1 tablet twice daily ; Breakfast, Afternoon(dinner)  Telmisartan 80 mg 1 tablet daily; afternoon( Lunch)   Clopidogrel 75 mg 1 tablet daily ; afternoon (dinner) Anastrozole 1 mg  1 tablet daily ; afternoon (dinner) Atorvastatin 40 mg 1 tablet daily ; afternoon (dinner)  Donepezil 10 mg 1 tablet at bedtime.   Requested for all medications be sent to [facility].   Follow-Up:Pharmacist Review   Charlann Lange, RMA Clinical Pharmacist Assistant [phone]  10 minutes spent in review, coordination, and documentation.  Reviewed by: Beverly Milch, PharmD Clinical Pharmacist [phone]

## 2020-08-16 ENCOUNTER — Other Ambulatory Visit: Payer: Self-pay

## 2020-08-16 DIAGNOSIS — E1165 Type 2 diabetes mellitus with hyperglycemia: Secondary | ICD-10-CM

## 2020-08-16 DIAGNOSIS — R300 Dysuria: Secondary | ICD-10-CM

## 2020-08-16 DIAGNOSIS — R413 Other amnesia: Secondary | ICD-10-CM

## 2020-08-16 DIAGNOSIS — Z1231 Encounter for screening mammogram for malignant neoplasm of breast: Secondary | ICD-10-CM

## 2020-08-16 DIAGNOSIS — I701 Atherosclerosis of renal artery: Secondary | ICD-10-CM

## 2020-08-16 DIAGNOSIS — I70203 Unspecified atherosclerosis of native arteries of extremities, bilateral legs: Secondary | ICD-10-CM

## 2020-08-16 DIAGNOSIS — I10 Essential (primary) hypertension: Secondary | ICD-10-CM

## 2020-08-16 DIAGNOSIS — E559 Vitamin D deficiency, unspecified: Secondary | ICD-10-CM

## 2020-08-16 DIAGNOSIS — E782 Mixed hyperlipidemia: Secondary | ICD-10-CM

## 2020-08-16 DIAGNOSIS — I70219 Atherosclerosis of native arteries of extremities with intermittent claudication, unspecified extremity: Secondary | ICD-10-CM

## 2020-08-16 DIAGNOSIS — Z0001 Encounter for general adult medical examination with abnormal findings: Secondary | ICD-10-CM

## 2020-08-16 DIAGNOSIS — I700 Atherosclerosis of aorta: Secondary | ICD-10-CM

## 2020-08-16 DIAGNOSIS — I482 Chronic atrial fibrillation, unspecified: Secondary | ICD-10-CM

## 2020-08-16 MED ORDER — CLOPIDOGREL BISULFATE 75 MG PO TABS: ORAL_TABLET | ORAL | 1 refills | Status: DC

## 2020-08-16 MED ORDER — TELMISARTAN 80 MG PO TABS: 80.0000 mg | ORAL_TABLET | Freq: Every day | ORAL | 1 refills | Status: DC

## 2020-08-16 MED ORDER — AMLODIPINE BESYLATE 10 MG PO TABS: ORAL_TABLET | ORAL | 1 refills | Status: DC

## 2020-08-16 MED ORDER — ATORVASTATIN CALCIUM 40 MG PO TABS: ORAL_TABLET | ORAL | 1 refills | Status: DC

## 2020-08-16 MED ORDER — ISOSORBIDE MONONITRATE ER 30 MG PO TB24: ORAL_TABLET | ORAL | 1 refills | Status: DC

## 2020-08-16 MED ORDER — RANOLAZINE ER 500 MG PO TB12: ORAL_TABLET | ORAL | 1 refills | Status: DC

## 2020-08-16 MED ORDER — DIGOXIN 125 MCG PO TABS
ORAL_TABLET | ORAL | 1 refills | Status: DC
Start: 2020-08-16 — End: 2020-10-05

## 2020-08-16 MED ORDER — ANASTROZOLE 1 MG PO TABS: ORAL_TABLET | ORAL | 1 refills | Status: DC

## 2020-08-16 MED ORDER — HYDRALAZINE HCL 25 MG PO TABS: ORAL_TABLET | ORAL | 1 refills | Status: DC

## 2020-08-18 DIAGNOSIS — N184 Chronic kidney disease, stage 4 (severe): Secondary | ICD-10-CM | POA: Diagnosis not present

## 2020-08-18 DIAGNOSIS — I10 Essential (primary) hypertension: Secondary | ICD-10-CM | POA: Diagnosis not present

## 2020-08-18 DIAGNOSIS — D508 Other iron deficiency anemias: Secondary | ICD-10-CM | POA: Diagnosis not present

## 2020-08-18 DIAGNOSIS — E875 Hyperkalemia: Secondary | ICD-10-CM | POA: Diagnosis not present

## 2020-08-29 ENCOUNTER — Ambulatory Visit: Payer: Medicare HMO | Admitting: Internal Medicine

## 2020-08-30 ENCOUNTER — Telehealth: Payer: Self-pay | Admitting: Pharmacist

## 2020-08-30 NOTE — Progress Notes (Addendum)
Chronic Care Management Pharmacy Assistant   Name: FATIMAH SUNDQUIST  MRN: [id number] DOB: 09/06/1933  Reason for Encounter: Medication Review/Medication /Coordination Call.   Medications: Outpatient Encounter Medications as of 08/30/2020  Medication Sig Note   ACCU-CHEK GUIDE test strip TEST BLOOD SUGAR THREE TIMES DAILY  AS  DIRECTED    Accu-Chek Softclix Lancets lancets USE AS INSTRUCTED 3 TIMES A DAY    acetaminophen (TYLENOL) 500 MG tablet Take 1,000 mg by mouth every 6 (six) hours as needed for moderate pain or headache.    Alcohol Swabs (B-D SINGLE USE SWABS REGULAR) PADS Use as directed for 3 times daily DX E11.65    amLODipine (NORVASC) 10 MG tablet Take 1 tablet (10 mg total) by mouth daily at breakfast    anastrozole (ARIMIDEX) 1 MG tablet Take 1 tablet (1 mg total) by mouth daily; afternoon (dinner)    aspirin EC 81 MG EC tablet Take 1 tablet (81 mg total) by mouth daily.    atorvastatin (LIPITOR) 40 MG tablet Take 40 mg by mouth daily; afternoon (dinner)    azithromycin (ZITHROMAX) 250 MG tablet Take one tab a day for 10 days for uri    Blood Glucose Monitoring Suppl (ACCU-CHEK GUIDE ME) w/Device KIT Use as instructed. DX e11.65    calcium carbonate (OSCAL) 1500 (600 Ca) MG TABS tablet Take 600 mg by mouth 4 (four) times a week.     cholecalciferol (VITAMIN D) 25 MCG (1000 UNIT) tablet TAKE 1 TABLET EVERY MONDAY, WEDNESDAY, AND FRIDAY.    clopidogrel (PLAVIX) 75 MG tablet TAKE 1 TABLET EVERY DAY AFTERNOON (DINNER)    CRANBERRY PO Take 4,200 mg by mouth 4 (four) times a week.    digoxin (LANOXIN) 0.125 MG tablet Take 1 tablet (125 mcg total) by mouth daily at breakfast.    docusate sodium (COLACE) 100 MG capsule Take 200 mg by mouth at bedtime as needed for mild constipation.     donepezil (ARICEPT) 10 MG tablet TAKE 1 TABLET BY MOUTH AT  BEDTIME FOR MEMORY    ferrous sulfate 325 (65 FE) MG EC tablet Take 325 mg by mouth 4 (four) times a week.    fluticasone-salmeterol  (ADVAIR) 100-50 MCG/ACT AEPB Inhale 1 puff into the lungs 2 (two) times daily.    FLUZONE HIGH-DOSE QUADRIVALENT 0.7 ML SUSY     hydrALAZINE (APRESOLINE) 25 MG tablet Take 1 tablet by mouth twice daily; breakfast, bedtime    insulin NPH-regular Human (70-30) 100 UNIT/ML injection Inject 30 Units into the skin 2 (two) times daily. 06/25/2018: Took 15 U this AM 0815   isosorbide mononitrate (IMDUR) 30 MG 24 hr tablet Take 1 tablet (30 mg total) by mouth daily at breakfast    magnesium oxide (MAG-OX) 400 (241.3 Mg) MG tablet TAKE 1 TABLET EVERY DAY    pantoprazole (PROTONIX) 40 MG tablet TAKE 1 TABLET BY MOUTH  DAILY    predniSONE (DELTASONE) 10 MG tablet Take one tab 3 x day for 3 days, then take one tab 2 x a day for 3 days and then take one tab a day for 3 days for copd    PROAIR HFA 108 (90 Base) MCG/ACT inhaler Inhale 2 puffs into the lungs every 6 (six) hours as needed for wheezing or shortness of breath.     ranolazine (RANEXA) 500 MG 12 hr tablet Take 500 mg by mouth 2 (two) times daily; breakfast, Afternoon (dinner)    telmisartan (MICARDIS) 80 MG tablet Take 1 tablet (80  mg total) by mouth daily. Afternoon (lunch)    vitamin C (ASCORBIC ACID) 500 MG tablet Take by mouth daily.    Vitamin D, Cholecalciferol, 25 MCG (1000 UT) CAPS Take 1,000 Units by mouth every Monday, Wednesday, and Friday.    No facility-administered encounter medications on file as of 08/30/2020.    Reviewed chart for medication changes ahead of medication coordination call.  No [facility] hospital visits since last care coordination call.  Consult Visits: 08/18/20 Nephrology Murlean Iba, MD  For follow-up.   No medication changes indicated.  BP Readings from Last 3 Encounters:  08/11/20 (!) 178/56  03/24/20 (!) 178/64  09/08/19 (!) 142/62    Lab Results  Component Value Date   HGBA1C 5.9 (A) 03/25/2020     Patient obtains medications through Adherence Packaging  90 Days  Patient is due adherence delivery on:  09/09/20.  Called patient and reviewed medications and coordinated delivery.  This delivery to include: Hydralazine 25 mg 1 tablet twice daily ; 1 tablet breakfast, 1 tablet bedtime  Amlodipine 10 mg 1 tablet daily; breakfast Digoxin 0.125 mg 1 tablet daily; breakfast Isosorbide 30 mg 1 tablet daily ; breakfast Ranolazine 500 mg 1 tablet twice daily ; 1 tablet Breakfast, 1 tablet Afternoon(dinner)   Telmisartan 80 mg 1 tablet daily; afternoon( Lunch)    Clopidogrel 75 mg 1 tablet daily ; afternoon (dinner) Anastrozole 1 mg  1 tablet daily ; afternoon (dinner) Atorvastatin 40 mg 1 tablet daily ; afternoon (dinner)   Donepezil 10 mg 1 tablet at bedtime.   Patient declined the following medications: None.  Patient does not need refills at this time.  Confirmed delivery date of 09/09/20, advised patient that pharmacy will contact them the morning of delivery.  Follow-Up:Pharmacist Review  Charlann Lange, RMA Clinical Pharmacist Assistant [phone]  10 minutes spent in review, coordination, and documentation.  Reviewed by: Beverly Milch, PharmD Clinical Pharmacist [phone]

## 2020-08-31 ENCOUNTER — Telehealth: Payer: Self-pay | Admitting: Internal Medicine

## 2020-08-31 ENCOUNTER — Inpatient Hospital Stay: Payer: Medicare HMO | Attending: Internal Medicine | Admitting: Internal Medicine

## 2020-08-31 ENCOUNTER — Encounter: Payer: Self-pay | Admitting: Internal Medicine

## 2020-08-31 ENCOUNTER — Inpatient Hospital Stay: Payer: Medicare HMO

## 2020-08-31 DIAGNOSIS — M1990 Unspecified osteoarthritis, unspecified site: Secondary | ICD-10-CM | POA: Insufficient documentation

## 2020-08-31 DIAGNOSIS — Z8673 Personal history of transient ischemic attack (TIA), and cerebral infarction without residual deficits: Secondary | ICD-10-CM | POA: Insufficient documentation

## 2020-08-31 DIAGNOSIS — M2550 Pain in unspecified joint: Secondary | ICD-10-CM | POA: Insufficient documentation

## 2020-08-31 DIAGNOSIS — I129 Hypertensive chronic kidney disease with stage 1 through stage 4 chronic kidney disease, or unspecified chronic kidney disease: Secondary | ICD-10-CM | POA: Diagnosis not present

## 2020-08-31 DIAGNOSIS — N184 Chronic kidney disease, stage 4 (severe): Secondary | ICD-10-CM | POA: Insufficient documentation

## 2020-08-31 DIAGNOSIS — E1122 Type 2 diabetes mellitus with diabetic chronic kidney disease: Secondary | ICD-10-CM | POA: Diagnosis not present

## 2020-08-31 DIAGNOSIS — Z87891 Personal history of nicotine dependence: Secondary | ICD-10-CM | POA: Diagnosis not present

## 2020-08-31 DIAGNOSIS — Z79899 Other long term (current) drug therapy: Secondary | ICD-10-CM | POA: Insufficient documentation

## 2020-08-31 DIAGNOSIS — R0602 Shortness of breath: Secondary | ICD-10-CM | POA: Diagnosis not present

## 2020-08-31 DIAGNOSIS — Z8249 Family history of ischemic heart disease and other diseases of the circulatory system: Secondary | ICD-10-CM | POA: Diagnosis not present

## 2020-08-31 DIAGNOSIS — Z801 Family history of malignant neoplasm of trachea, bronchus and lung: Secondary | ICD-10-CM | POA: Insufficient documentation

## 2020-08-31 DIAGNOSIS — R5383 Other fatigue: Secondary | ICD-10-CM | POA: Diagnosis not present

## 2020-08-31 DIAGNOSIS — D631 Anemia in chronic kidney disease: Secondary | ICD-10-CM | POA: Diagnosis not present

## 2020-08-31 DIAGNOSIS — Z833 Family history of diabetes mellitus: Secondary | ICD-10-CM | POA: Diagnosis not present

## 2020-08-31 DIAGNOSIS — Z806 Family history of leukemia: Secondary | ICD-10-CM | POA: Diagnosis not present

## 2020-08-31 LAB — RETICULOCYTES
Immature Retic Fract: 12.1 % (ref 2.3–15.9)
RBC.: 3.2 MIL/uL — ABNORMAL LOW (ref 3.87–5.11)
Retic Count, Absolute: 89.3 10*3/uL (ref 19.0–186.0)
Retic Ct Pct: 2.8 % (ref 0.4–3.1)

## 2020-08-31 LAB — CBC WITH DIFFERENTIAL/PLATELET
Abs Immature Granulocytes: 0.03 10*3/uL (ref 0.00–0.07)
Basophils Absolute: 0 10*3/uL (ref 0.0–0.1)
Basophils Relative: 1 %
Eosinophils Absolute: 0.1 10*3/uL (ref 0.0–0.5)
Eosinophils Relative: 1 %
HCT: 27.8 % — ABNORMAL LOW (ref 36.0–46.0)
Hemoglobin: 8 g/dL — ABNORMAL LOW (ref 12.0–15.0)
Immature Granulocytes: 0 %
Lymphocytes Relative: 25 %
Lymphs Abs: 1.7 10*3/uL (ref 0.7–4.0)
MCH: 24.5 pg — ABNORMAL LOW (ref 26.0–34.0)
MCHC: 28.8 g/dL — ABNORMAL LOW (ref 30.0–36.0)
MCV: 85.3 fL (ref 80.0–100.0)
Monocytes Absolute: 0.5 10*3/uL (ref 0.1–1.0)
Monocytes Relative: 8 %
Neutro Abs: 4.4 10*3/uL (ref 1.7–7.7)
Neutrophils Relative %: 65 %
Platelets: 373 10*3/uL (ref 150–400)
RBC: 3.26 MIL/uL — ABNORMAL LOW (ref 3.87–5.11)
RDW: 17.2 % — ABNORMAL HIGH (ref 11.5–15.5)
WBC: 6.7 10*3/uL (ref 4.0–10.5)
nRBC: 0 % (ref 0.0–0.2)

## 2020-08-31 LAB — ABO/RH: ABO/RH(D): O POS

## 2020-08-31 LAB — SAMPLE TO BLOOD BANK

## 2020-08-31 LAB — TECHNOLOGIST SMEAR REVIEW: Plt Morphology: ADEQUATE

## 2020-08-31 LAB — LACTATE DEHYDROGENASE: LDH: 137 U/L (ref 98–192)

## 2020-08-31 NOTE — Assessment & Plan Note (Addendum)
#  Symptomatic anemia hemoglobin 8; question CKD versus others.  August 2022 iron saturation-5%; ferritin elevated.   # Long discussion with the patient and family regarding multiple etiologies of anemia including iron deficiency/nutritional deficiency /chronic kidney disease chronic inflammation.  Less likely primary bone marrow disorder.  # Check CBC  myeloma panel kappa lambda light chain; review of peripheral smear; erythropoietin level; ABO/Rh; HOLD tube-in case patient needed a blood transfusion.  # Patient is symptomatic from worsening fatigue-recommend IV iron infusions. Discussed the potential acute infusion reactions with IV iron; which are quite rare [pt had previous infusion].  Patient understands the risk; will proceed with infusions.  #Etiology: Likely CKD stage IV; await above work-up.  # DISPOSITION: # labs today- ordered today # venofer weekly x4- start next week  # follow up in 6 weeks- MD/NP- labs- cbc/bmp;possible retacrit/venofer- Dr.B  Thank you, Dr.Khan for allowing me to participate in the care of your pleasant patient. Please do not hesitate to contact me with questions or concerns in the interim.  Above plan of care was discussed with patient and daughter in detail  Addendum: Patient's hemoglobin is 8; hold off any PRBC transfusion.  Plan IV iron infusion as planned.  Will inform family.

## 2020-08-31 NOTE — Progress Notes (Signed)
[name]  Patient Care Team: [name], MD as PCP - General (Internal Medicine) Edythe Clarity, [facility] as Pharmacist (Pharmacist)  CHIEF COMPLAINTS/PURPOSE OF CONSULTATION: ANEMIA   HEMATOLOGY HISTORY:  # ANEMIA sec to CKD [iron sat-8%; Ferritin- 152] 10/07/18: Colonoscopy/EGD polyps, diverticulae, hemorrhoids, non-obstructive Schatzki ring; NO capsule   # CKD stage IV-/ Lupus [> 50 years] ; Diabetes mellitus, type II on insulin  # Left breast cancer: IDC, 14 mm, grade 1, ER positive, PR equivocal, HER-2 2+/FISH negative     Ref. Range 08/12/2020 10:32  Iron Latest Ref Range: 27 - 139 ug/dL 20 (L)  UIBC Latest Ref Range: 118 - 369 ug/dL 218  TIBC Latest Ref Range: 250 - 450 ug/dL 238 (L)  Ferritin Latest Ref Range: 15 - 150 ng/mL 152 (H)  Iron Saturation Latest Ref Range: 15 - 55 % 8 (LL)  Folate Latest Ref Range: >3.0 ng/mL 15.4    HISTORY OF PRESENTING ILLNESS: rolling walker at home; with daughter.  Jennifer Carrillo 87 y.o.  female has been referred to [place] for further evaluation/work-up for anemia.  Patient has a prior history of anemia/thought to be from CKD.  Patient received IV iron infusions with Dr. Lonia Chimera in [place].  She was lost to follow-up.  Recently noted to have worsening fatigue shortness of breath on exertion.  Blood in stools: None Change in bowel habits- None Blood in urine: None Difficulty swallowing: None Abnormal weight loss: None Iron supplementation: OTC Iron.  Prior Blood transfusions: remote  Bariatric surgery: None Vaginal bleeding: None  Review of Systems  Constitutional:  Positive for malaise/fatigue. Negative for chills, diaphoresis, fever and weight loss.  HENT:  Negative for nosebleeds and sore throat.   Eyes:  Negative for double vision.  Respiratory:  Positive for shortness of breath. Negative for cough, hemoptysis, sputum production and wheezing.   Cardiovascular:  Negative for  chest pain, palpitations, orthopnea and leg swelling.  Gastrointestinal:  Negative for abdominal pain, blood in stool, constipation, diarrhea, heartburn, melena, nausea and vomiting.  Genitourinary:  Negative for dysuria, frequency and urgency.  Musculoskeletal:  Positive for joint pain. Negative for back pain.  Skin: Negative.  Negative for itching and rash.  Neurological:  Negative for dizziness, tingling, focal weakness, weakness and headaches.  Endo/Heme/Allergies:  Does not bruise/bleed easily.  Psychiatric/Behavioral:  Negative for depression. The patient is not nervous/anxious and does not have insomnia.    MEDICAL HISTORY:  Past Medical History:  Diagnosis Date   Arthritis    Asthma    Calf pain    Cancer ([place]) 2016   left breast   COPD (chronic obstructive pulmonary disease) (HCC)    Diabetes (HCC)    Discoid lupus    Gastro-esophageal reflux    Glaucoma    Hyperlipemia    Hypertension    Iron deficiency anemia    Joint pain    Leg swelling    Osteoarthritis    PVD (peripheral vascular disease) ([place])    Sinus problem    Sleep apnea    No CPAP/ Can't tolerate   Stroke ([place]) 1987    SURGICAL HISTORY: Past Surgical History:  Procedure Laterality Date   BREAST SURGERY Left    left lumpectomy   CAROTID ANGIOGRAPHY Left 06/25/2018   Procedure: CAROTID ANGIOGRAPHY, possible intervention;  Surgeon: Katha Cabal, MD;  Location: [facility];  Service: Cardiovascular;  Laterality: Left;   CAROTID ENDARTERECTOMY Right  CAROTID PTA/STENT INTERVENTION Left 06/25/2018   Procedure: CAROTID PTA/STENT INTERVENTION;  Surgeon: Katha Cabal, MD;  Location: [facility];  Service: Cardiovascular;  Laterality: Left;   CATARACT EXTRACTION Right 2013   CORONARY STENT PLACEMENT     L. L. E.   EYE SURGERY Bilateral    cataract   LEFT HEART CATH AND CORONARY ANGIOGRAPHY Left 05/06/2018   Procedure: LEFT HEART CATH AND CORONARY ANGIOGRAPHY;  Surgeon:  Corey Skains, MD;  Location: [facility];  Service: Cardiovascular;  Laterality: Left;   LOWER EXTREMITY ANGIOGRAPHY Right 01/22/2018   Procedure: LOWER EXTREMITY ANGIOGRAPHY;  Surgeon: Katha Cabal, MD;  Location: [facility];  Service: Cardiovascular;  Laterality: Right;   RENAL ANGIOGRAPHY Left 02/26/2018   Procedure: RENAL ANGIOGRAPHY;  Surgeon: Katha Cabal, MD;  Location: [facility];  Service: Cardiovascular;  Laterality: Left;   STENT PLACEMENT VASCULAR ([facility] H[facility]) Left    stent placement on LLE   TUBAL LIGATION      SOCIAL HISTORY: Social History   Socioeconomic History   Marital status: Married    Spouse name: Not on file   Number of children: Not on file   Years of education: Not on file   Highest education level: Not on file  Occupational History   Not on file  Tobacco Use   Smoking status: Former    Types: Cigarettes    Quit date: 67    Years since quitting: 35.6   Smokeless tobacco: Never   Tobacco comments:    quit 31 years  Vaping Use   Vaping Use: Never used  Substance and Sexual Activity   Alcohol use: No   Drug use: No   Sexual activity: Not on file  Other Topics Concern   Not on file  Social History Narrative   Walks with a rolling walker; remote hx of smoking; no alcohol; [place]- with husband/son. Daughter lives in [place].    Social Determinants of Health   Financial Resource Strain: Low Risk    Difficulty of Paying Living Expenses: Not very hard  Food Insecurity: Not on file  Transportation Needs: Not on file  Physical Activity: Not on file  Stress: Not on file  Social Connections: Not on file  Intimate Partner Violence: Not on file    FAMILY HISTORY: Family History  Problem Relation Age of Onset   Heart disease Mother    Diabetes Other    Hypertension Other    Diabetes Sister    Lung cancer Brother    Leukemia Daughter    Bladder Cancer Neg Hx    Kidney disease Neg Hx    Prostate  cancer Neg Hx     ALLERGIES:  is allergic to benazepril.  MEDICATIONS:  Current Outpatient Medications  Medication Sig Dispense Refill   ACCU-CHEK GUIDE test strip TEST BLOOD SUGAR THREE TIMES DAILY  AS  DIRECTED 300 strip 1   Accu-Chek Softclix Lancets lancets USE AS INSTRUCTED 3 TIMES A DAY 300 each 3   acetaminophen (TYLENOL) 500 MG tablet Take 1,000 mg by mouth every 6 (six) hours as needed for moderate pain or headache.     Alcohol Swabs (B-D SINGLE USE SWABS REGULAR) PADS Use as directed for 3 times daily DX E11.65 100 each 1   amLODipine (NORVASC) 10 MG tablet Take 1 tablet (10 mg total) by mouth daily at breakfast 90 tablet 1   anastrozole (ARIMIDEX) 1 MG tablet Take 1 tablet (1 mg total)  by mouth daily; afternoon (dinner) 90 tablet 1   Blood Glucose Monitoring Suppl (ACCU-CHEK GUIDE ME) w/Device KIT Use as instructed. DX e11.65 1 kit 0   clopidogrel (PLAVIX) 75 MG tablet TAKE 1 TABLET EVERY DAY AFTERNOON (DINNER) 90 tablet 1   digoxin (LANOXIN) 0.125 MG tablet Take 1 tablet (125 mcg total) by mouth daily at breakfast. 90 tablet 1   donepezil (ARICEPT) 10 MG tablet TAKE 1 TABLET BY MOUTH AT  BEDTIME FOR MEMORY 90 tablet 0   ferrous sulfate 325 (65 FE) MG EC tablet Take 325 mg by mouth 4 (four) times a week.     fluticasone-salmeterol (ADVAIR) 100-50 MCG/ACT AEPB Inhale 1 puff into the lungs 2 (two) times daily. 1 each 3   hydrALAZINE (APRESOLINE) 25 MG tablet Take 1 tablet by mouth twice daily; breakfast, bedtime 180 tablet 1   hydrochlorothiazide (HYDRODIURIL) 12.5 MG tablet Take 1 tablet by mouth daily.     insulin NPH-regular Human (70-30) 100 UNIT/ML injection Inject 30 Units into the skin 2 (two) times daily.     isosorbide mononitrate (IMDUR) 30 MG 24 hr tablet Take 1 tablet (30 mg total) by mouth daily at breakfast 90 tablet 1   ranolazine (RANEXA) 500 MG 12 hr tablet Take 500 mg by mouth 2 (two) times daily; breakfast, Afternoon (dinner) 180 tablet 1   telmisartan (MICARDIS)  80 MG tablet Take 1 tablet (80 mg total) by mouth daily. Afternoon (lunch) 90 tablet 1   cholecalciferol (VITAMIN D) 25 MCG (1000 UNIT) tablet TAKE 1 TABLET EVERY MONDAY, WEDNESDAY, AND FRIDAY. (Patient not taking: Reported on 08/31/2020) 39 tablet 3   docusate sodium (COLACE) 100 MG capsule Take 200 mg by mouth at bedtime as needed for mild constipation.  (Patient not taking: Reported on 08/31/2020)     vitamin C (ASCORBIC ACID) 500 MG tablet Take by mouth daily. (Patient not taking: No sig reported)     Vitamin D, Cholecalciferol, 25 MCG (1000 UT) CAPS Take 1,000 Units by mouth every Monday, Wednesday, and Friday. (Patient not taking: Reported on 08/31/2020) 60 capsule 0   No current facility-administered medications for this visit.      PHYSICAL EXAMINATION:   Vitals:   08/31/20 1130  BP: (!) 164/56  Pulse: 66  Resp: 16  Temp: 98.5 F (36.9 C)  SpO2: 100%   Filed Weights   08/31/20 1130  Weight: 184 lb 3.2 oz (83.6 kg)    Physical Exam Vitals and nursing note reviewed.  HENT:     Head: Normocephalic and atraumatic.     Mouth/Throat:     Pharynx: Oropharynx is clear.  Eyes:     Extraocular Movements: Extraocular movements intact.     Pupils: Pupils are equal, round, and reactive to light.  Cardiovascular:     Rate and Rhythm: Normal rate and regular rhythm.  Pulmonary:     Comments: Decreased breath sounds bilaterally.  Abdominal:     Palpations: Abdomen is soft.  Musculoskeletal:        General: Normal range of motion.     Cervical back: Normal range of motion.  Skin:    General: Skin is warm.  Neurological:     General: No focal deficit present.     Mental Status: She is alert and oriented to person, place, and time.  Psychiatric:        Behavior: Behavior normal.        Judgment: Judgment normal.   LABORATORY DATA:  I have reviewed the data  as listed Lab Results  Component Value Date   WBC 6.7 08/31/2020   HGB 8.0 (L) 08/31/2020   HCT 27.8 (L) 08/31/2020    MCV 85.3 08/31/2020   PLT 373 08/31/2020   Recent Labs    08/12/20 1032  NA 142  K 5.6*  CL 110*  CO2 16*  GLUCOSE 73  BUN 37*  CREATININE 2.90*  CALCIUM 9.1  PROT 6.4  ALBUMIN 3.6  AST 13  ALT 15  ALKPHOS 92  BILITOT 0.2     No results found.  Anemia of chronic kidney failure, stage 4 (severe) (HCC) #Symptomatic anemia hemoglobin 8; question CKD versus others.  August 2022 iron saturation-5%; ferritin elevated.   # Long discussion with the patient and family regarding multiple etiologies of anemia including iron deficiency/nutritional deficiency /chronic kidney disease chronic inflammation.  Less likely primary bone marrow disorder.  # Check CBC  myeloma panel kappa lambda light chain; review of peripheral smear; erythropoietin level; ABO/Rh; HOLD tube-in case patient needed a blood transfusion.  # Patient is symptomatic from worsening fatigue-recommend IV iron infusions. Discussed the potential acute infusion reactions with IV iron; which are quite rare [pt had previous infusion].  Patient understands the risk; will proceed with infusions.  #Etiology: Likely CKD stage IV; await above work-up.  # DISPOSITION: # labs today- ordered today # venofer weekly x4- start next week  # follow up in 6 weeks- MD/NP- labs- cbc/bmp;possible retacrit/venofer- Dr.B  Thank you, Dr.Khan for allowing me to participate in the care of your pleasant patient. Please do not hesitate to contact me with questions or concerns in the interim.  Above plan of care was discussed with patient and daughter in detail   All questions were answered. The patient knows to call the clinic with any problems, questions or concerns.    Cammie Sickle, MD 08/31/2020 1:22 PM

## 2020-08-31 NOTE — Telephone Encounter (Signed)
Patient's hemoglobin is 8; hold off any PRBC transfusion.  Plan IV iron infusion as planned.  Please inform daughter; follow-up as planned.  GB

## 2020-09-01 DIAGNOSIS — N184 Chronic kidney disease, stage 4 (severe): Secondary | ICD-10-CM | POA: Diagnosis not present

## 2020-09-01 DIAGNOSIS — D508 Other iron deficiency anemias: Secondary | ICD-10-CM | POA: Diagnosis not present

## 2020-09-01 DIAGNOSIS — E875 Hyperkalemia: Secondary | ICD-10-CM | POA: Diagnosis not present

## 2020-09-01 LAB — KAPPA/LAMBDA LIGHT CHAINS
Kappa free light chain: 78.2 mg/L — ABNORMAL HIGH (ref 3.3–19.4)
Kappa, lambda light chain ratio: 1.33 (ref 0.26–1.65)
Lambda free light chains: 58.6 mg/L — ABNORMAL HIGH (ref 5.7–26.3)

## 2020-09-01 LAB — ERYTHROPOIETIN: Erythropoietin: 9.1 m[IU]/mL (ref 2.6–18.5)

## 2020-09-02 LAB — IMMUNOFIXATION ELECTROPHORESIS
IgA: 298 mg/dL (ref 64–422)
IgG (Immunoglobin G), Serum: 899 mg/dL (ref 586–1602)
IgM (Immunoglobulin M), Srm: 41 mg/dL (ref 26–217)
Total Protein ELP: 6.3 g/dL (ref 6.0–8.5)

## 2020-09-07 DIAGNOSIS — I10 Essential (primary) hypertension: Secondary | ICD-10-CM | POA: Diagnosis not present

## 2020-09-07 DIAGNOSIS — N184 Chronic kidney disease, stage 4 (severe): Secondary | ICD-10-CM | POA: Diagnosis not present

## 2020-09-07 DIAGNOSIS — E119 Type 2 diabetes mellitus without complications: Secondary | ICD-10-CM | POA: Diagnosis not present

## 2020-09-08 ENCOUNTER — Emergency Department
Admission: EM | Admit: 2020-09-08 | Discharge: 2020-09-08 | Disposition: A | Discharge status: Home / Self Care | Payer: Medicare HMO | Attending: Emergency Medicine | Admitting: Emergency Medicine

## 2020-09-08 ENCOUNTER — Other Ambulatory Visit: Payer: Self-pay

## 2020-09-08 ENCOUNTER — Inpatient Hospital Stay: Payer: Medicare HMO | Attending: Internal Medicine

## 2020-09-08 VITALS — BP 196/49 | HR 65 | Temp 97.0°F | Resp 17

## 2020-09-08 DIAGNOSIS — J45909 Unspecified asthma, uncomplicated: Secondary | ICD-10-CM | POA: Diagnosis not present

## 2020-09-08 DIAGNOSIS — I129 Hypertensive chronic kidney disease with stage 1 through stage 4 chronic kidney disease, or unspecified chronic kidney disease: Secondary | ICD-10-CM | POA: Diagnosis not present

## 2020-09-08 DIAGNOSIS — N184 Chronic kidney disease, stage 4 (severe): Secondary | ICD-10-CM

## 2020-09-08 DIAGNOSIS — R030 Elevated blood-pressure reading, without diagnosis of hypertension: Secondary | ICD-10-CM | POA: Diagnosis present

## 2020-09-08 DIAGNOSIS — Z79899 Other long term (current) drug therapy: Secondary | ICD-10-CM | POA: Insufficient documentation

## 2020-09-08 DIAGNOSIS — I10 Essential (primary) hypertension: Secondary | ICD-10-CM | POA: Diagnosis not present

## 2020-09-08 DIAGNOSIS — R5383 Other fatigue: Secondary | ICD-10-CM | POA: Insufficient documentation

## 2020-09-08 DIAGNOSIS — Z7902 Long term (current) use of antithrombotics/antiplatelets: Secondary | ICD-10-CM | POA: Diagnosis not present

## 2020-09-08 DIAGNOSIS — Z853 Personal history of malignant neoplasm of breast: Secondary | ICD-10-CM | POA: Insufficient documentation

## 2020-09-08 DIAGNOSIS — Z7951 Long term (current) use of inhaled steroids: Secondary | ICD-10-CM | POA: Insufficient documentation

## 2020-09-08 DIAGNOSIS — Z87891 Personal history of nicotine dependence: Secondary | ICD-10-CM | POA: Diagnosis not present

## 2020-09-08 DIAGNOSIS — D631 Anemia in chronic kidney disease: Secondary | ICD-10-CM | POA: Diagnosis not present

## 2020-09-08 DIAGNOSIS — I2510 Atherosclerotic heart disease of native coronary artery without angina pectoris: Secondary | ICD-10-CM | POA: Insufficient documentation

## 2020-09-08 DIAGNOSIS — Z794 Long term (current) use of insulin: Secondary | ICD-10-CM | POA: Diagnosis not present

## 2020-09-08 DIAGNOSIS — E1122 Type 2 diabetes mellitus with diabetic chronic kidney disease: Secondary | ICD-10-CM | POA: Insufficient documentation

## 2020-09-08 DIAGNOSIS — E162 Hypoglycemia, unspecified: Secondary | ICD-10-CM | POA: Insufficient documentation

## 2020-09-08 MED ORDER — IRON SUCROSE 20 MG/ML IV SOLN
200.0000 mg | Freq: Once | INTRAVENOUS | Status: AC
  Administered 2020-09-08: 200 mg via INTRAVENOUS
  Filled 2020-09-08: qty 10

## 2020-09-08 MED ORDER — HYDRALAZINE HCL 50 MG PO TABS
25.0000 mg | ORAL_TABLET | Freq: Once | ORAL | Status: AC
  Administered 2020-09-08: 25 mg via ORAL
  Filled 2020-09-08: qty 1

## 2020-09-08 MED ORDER — SODIUM CHLORIDE 0.9 % IV SOLN: 200.0000 mg | Freq: Once | INTRAVENOUS | Status: DC

## 2020-09-08 MED ORDER — SODIUM CHLORIDE 0.9 % IV SOLN
Freq: Once | INTRAVENOUS | Status: AC
  Filled 2020-09-08: qty 250

## 2020-09-08 NOTE — Patient Instructions (Signed)

## 2020-09-08 NOTE — ED Notes (Signed)
See triage note  Presents from [place] she went for her iron infusion and was told that her b/p was elevated  Needed to be evaluated

## 2020-09-08 NOTE — Progress Notes (Signed)
8979: b/p 203/55 0813: b/p 195/55 0830: b/p 200/50 Pt denies any concerns or symptoms of HTN, such as but not limited to Headaches and blurred vision. Pt states that she normally takes b/p medications between 9-10am and that she has not taken them today. MD aware. Per Dr. Rogue Bussing proceed with Venofer, Pt to follow-up wi th PCP for HTN. Pt aware and verbalizes understanding.   0930: Pt stable at discharge.

## 2020-09-08 NOTE — ED Triage Notes (Signed)
Pt sent from cancer center where receiving iron infusion for HTN. Reports hx of HTN, usually takes meds mid day and has not taken any today. Received infusion. Denies pain.  Alert and oriented

## 2020-09-08 NOTE — ED Provider Notes (Signed)
[facility] Emergency Department Provider Note   ____________________________________________   Event Date/Time   First MD Initiated Contact with Patient 09/08/20 1040     (approximate)  I have reviewed the triage vital signs and the nursing notes.   HISTORY  Chief Complaint Hypertension    HPI Jennifer Carrillo is a 87 y.o. female who was at the cancer center getting an iron infusion and was noted her blood pressure was up to 200.  It did not come down so she was sent here.  Here her blood pressure is 993 systolic.  She is feeling well.  She has no symptoms.  She did not take any of her antihypertensive medications this morning prior to going for her appointment.  She was started on 2 medicines but did not take them either.  One of them was sodium bicarb.  If she had taken in the bicarbonate is possible that the sodium may have contributed to the increased blood pressure but since as I understand she did not take it today I do not think that is the case.         Past Medical History:  Diagnosis Date   Arthritis    Asthma    Calf pain    Cancer ([facility]) 2016   left breast   COPD (chronic obstructive pulmonary disease) (HCC)    Diabetes (HCC)    Discoid lupus    Gastro-esophageal reflux    Glaucoma    Hyperlipemia    Hypertension    Iron deficiency anemia    Joint pain    Leg swelling    Osteoarthritis    PVD (peripheral vascular disease) ([place])    Sinus problem    Sleep apnea    No CPAP/ Can't tolerate   Stroke [facility]) 1987    Patient Active Problem List   Diagnosis Date Noted   Anemia of chronic kidney failure, stage 4 (severe) ([place]) 08/31/2020   Carotid stenosis, symptomatic w/o infarct 06/25/2018   Hypertension 06/18/2018   Carbuncle and furuncle of buttock 06/18/2018   Coronary artery disease involving native coronary artery of native heart 05/19/2018   Old inferior wall myocardial infarction 05/19/2018   Stable angina ([place]) 03/31/2018    Abnormal ECG 03/07/2018   Benign essential HTN 03/07/2018   Bilateral carotid artery stenosis 03/07/2018   SOBOE (shortness of breath on exertion) 03/07/2018   Atherosclerotic peripheral vascular disease with intermittent claudication ([place]) 03/07/2018   Carotid stenosis 02/26/2018   Renal artery stenosis (HCC) 02/26/2018   Anemia 02/05/2018   Leg pain 01/13/2018   PAD (peripheral artery disease) ([place]) 01/13/2018   Diabetes mellitus without complication ([place]) 71/69/6789   Renovascular hypertension 04/09/2017   Primary osteoarthritis of both knees 03/12/2017   Malignant neoplasm of breast in female, estrogen receptor positive ([place]) 12/16/2015   Iron deficiency anemia 12/08/2015    Past Surgical History:  Procedure Laterality Date   BREAST SURGERY Left    left lumpectomy   CAROTID ANGIOGRAPHY Left 06/25/2018   Procedure: CAROTID ANGIOGRAPHY, possible intervention;  Surgeon: Katha Cabal, MD;  Location: [place] [facility];  Service: Cardiovascular;  Laterality: Left;   CAROTID ENDARTERECTOMY Right    CAROTID PTA/STENT INTERVENTION Left 06/25/2018   Procedure: CAROTID PTA/STENT INTERVENTION;  Surgeon: Katha Cabal, MD;  Location: [place] [facility];  Service: Cardiovascular;  Laterality: Left;   CATARACT EXTRACTION Right 2013   CORONARY STENT PLACEMENT     L. L. E.   EYE SURGERY Bilateral  cataract   LEFT HEART CATH AND CORONARY ANGIOGRAPHY Left 05/06/2018   Procedure: LEFT HEART CATH AND CORONARY ANGIOGRAPHY;  Surgeon: Corey Skains, MD;  Location: [place] [facility];  Service: Cardiovascular;  Laterality: Left;   LOWER EXTREMITY ANGIOGRAPHY Right 01/22/2018   Procedure: LOWER EXTREMITY ANGIOGRAPHY;  Surgeon: Katha Cabal, MD;  Location: [place] [facility];  Service: Cardiovascular;  Laterality: Right;   RENAL ANGIOGRAPHY Left 02/26/2018   Procedure: RENAL ANGIOGRAPHY;  Surgeon: Katha Cabal, MD;  Location: [place] [facility];  Service:  Cardiovascular;  Laterality: Left;   STENT PLACEMENT VASCULAR ([facility]) Left    stent placement on LLE   TUBAL LIGATION      Prior to Admission medications   Medication Sig Start Date End Date Taking? Authorizing Provider  ACCU-CHEK GUIDE test strip TEST BLOOD SUGAR THREE TIMES DAILY  AS  DIRECTED 04/27/20   [name], MD  Accu-Chek Softclix Lancets lancets USE AS INSTRUCTED 3 TIMES A DAY 09/22/19   [name], MD  acetaminophen (TYLENOL) 500 MG tablet Take 1,000 mg by mouth every 6 (six) hours as needed for moderate pain or headache.    [provider]  Alcohol Swabs (B-D SINGLE USE SWABS REGULAR) PADS Use as directed for 3 times daily DX E11.65 06/22/19   Kendell Bane, NP  amLODipine (NORVASC) 10 MG tablet Take 1 tablet (10 mg total) by mouth daily at breakfast 08/16/20   [name], MD  anastrozole (ARIMIDEX) 1 MG tablet Take 1 tablet (1 mg total) by mouth daily; afternoon (dinner) 08/16/20   [name], MD  Blood Glucose Monitoring Suppl (ACCU-CHEK GUIDE ME) w/Device KIT Use as instructed. DX e11.65 06/18/19   Kendell Bane, NP  cholecalciferol (VITAMIN D) 25 MCG (1000 UNIT) tablet TAKE 1 TABLET EVERY MONDAY, WEDNESDAY, AND FRIDAY. Patient not taking: Reported on 08/31/2020 11/04/19   Luiz Ochoa, NP  clopidogrel (PLAVIX) 75 MG tablet TAKE 1 TABLET EVERY DAY AFTERNOON (DINNER) 08/16/20   [name], MD  digoxin [facility]) 0.125 MG tablet Take 1 tablet (125 mcg total) by mouth daily at breakfast. 08/16/20   [name], MD  docusate sodium (COLACE) 100 MG capsule Take 200 mg by mouth at bedtime as needed for mild constipation.  Patient not taking: Reported on 08/31/2020    [provider]  donepezil (ARICEPT) 10 MG tablet TAKE 1 TABLET BY MOUTH AT  BEDTIME FOR MEMORY 07/12/20   McDonough, Si Gaul, PA-C  ferrous sulfate 325 (65 FE) MG EC tablet Take 325 mg by mouth 4 (four) times a week.    [provider]  fluticasone-salmeterol (ADVAIR) 100-50  MCG/ACT AEPB Inhale 1 puff into the lungs 2 (two) times daily. 07/19/20   [name], MD  hydrALAZINE (APRESOLINE) 25 MG tablet Take 1 tablet by mouth twice daily; breakfast, bedtime 08/16/20   [name], MD  hydrochlorothiazide (HYDRODIURIL) 12.5 MG tablet Take 1 tablet by mouth daily. 04/03/18   [provider]  insulin NPH-regular Human (70-30) 100 UNIT/ML injection Inject 30 Units into the skin 2 (two) times daily.    [provider]  isosorbide mononitrate (IMDUR) 30 MG 24 hr tablet Take 1 tablet (30 mg total) by mouth daily at breakfast 08/16/20   [name], MD  ranolazine (RANEXA) 500 MG 12 hr tablet Take 500 mg by mouth 2 (two) times daily; breakfast, Afternoon (dinner) 08/16/20   [name], MD  telmisartan (  MICARDIS) 80 MG tablet Take 1 tablet (80 mg total) by mouth daily. Afternoon (lunch) 08/16/20   [name], MD  vitamin C (ASCORBIC ACID) 500 MG tablet Take by mouth daily. Patient not taking: No sig reported    [provider]  Vitamin D, Cholecalciferol, 25 MCG (1000 UT) CAPS Take 1,000 Units by mouth every Monday, Wednesday, and Friday. Patient not taking: Reported on 08/31/2020 09/09/19   Luiz Ochoa, NP    Allergies Benazepril  Family History  Problem Relation Age of Onset   Heart disease Mother    Diabetes Other    Hypertension Other    Diabetes Sister    Lung cancer Brother    Leukemia Daughter    Bladder Cancer Neg Hx    Kidney disease Neg Hx    Prostate cancer Neg Hx     Social History Social History   Tobacco Use   Smoking status: Former    Types: Cigarettes    Quit date: 1987    Years since quitting: 35.6   Smokeless tobacco: Never   Tobacco comments:    quit 31 years  Vaping Use   Vaping Use: Never used  Substance Use Topics   Alcohol use: No   Drug use: No    Review of Systems  Constitutional: No fever/chills Eyes: No visual changes. ENT: No sore throat. Cardiovascular: Denies chest pain. Respiratory:  Denies shortness of breath. Gastrointestinal: No abdominal pain.  No nausea, no vomiting.  No diarrhea.  No constipation. Genitourinary: Negative for dysuria. Musculoskeletal: Negative for back pain. Skin: Negative for rash. Neurological: Negative for headaches, focal weakness   ____________________________________________   PHYSICAL EXAM:  VITAL SIGNS: ED Triage Vitals  Enc Vitals Group     BP 09/08/20 1013 (!) 184/59     Pulse Rate 09/08/20 1013 (!) 57     Resp 09/08/20 1013 16     Temp 09/08/20 1013 97.9 F (36.6 C)     Temp src --      SpO2 09/08/20 1013 100 %     Weight 09/08/20 1014 185 lb 3 oz (84 kg)     Height 09/08/20 1014 _0  (1.702 m)     Head Circumference --      Peak Flow --      Pain Score 09/08/20 1014 0     Pain Loc --      Pain Edu? --      Excl. in [place]? --     Constitutional: Alert and oriented. Well appearing and in no acute distress. Eyes: Conjunctivae are normal. PER.  Head: Atraumatic. Nose: No congestion/rhinnorhea. Mouth/Throat: Mucous membranes are moist.  Oropharynx non-erythematous. Neck: No stridor.   Cardiovascular: Normal rate, regular rhythm. Grossly normal heart sounds.  Good peripheral circulation. Respiratory: Normal respiratory effort.  No retractions. Lungs CTAB. Gastrointestinal: Soft and nontender. No distention. No abdominal bruits. No CVA tenderness. Musculoskeletal: No lower extremity tenderness trace bilateral edema.   Neurologic:  Normal speech and language. No gross focal neurologic deficits are appreciated. Skin:  Skin is warm, dry and intact. No rash noted.  ____________________________________________   LABS (all labs ordered are listed, but only abnormal results are displayed)  Labs Reviewed - No data to display ____________________________________________  EKG   ____________________________________________  RADIOLOGY Gertha Calkin, personally viewed and evaluated these images (plain radiographs) as  part of my medical decision making, as well as reviewing the written report by the radiologist.  ED MD interpretation:  Official radiology report(s): No results found.  ____________________________________________   PROCEDURES  Procedure(s) performed (including Critical Care):  Procedures   ____________________________________________   INITIAL IMPRESSION / ASSESSMENT AND PLAN / ED COURSE  I will give the patient one of her hydralazine that she missed taking.  She will go home and take the rest of her medications.  This should result in lowering her blood pressure.  I will have her follow-up with her doctor in the next couple days to repeat her blood pressure check.  I will have her make sure that she takes all of her medicines prior to her appointment.              ____________________________________________   FINAL CLINICAL IMPRESSION(S) / ED DIAGNOSES  Final diagnoses:  Hypertension, unspecified type     ED Discharge Orders     None        Note:  This document was prepared using Dragon voice recognition software and may include unintentional dictation errors.    Nena Polio, MD 09/08/20 1104

## 2020-09-08 NOTE — Discharge Instructions (Addendum)
Please take the rest of your antihypertensive medications when you get home.  Remember that we gave you the hydralazine here.  Do not take the hydralazine this morning but be sure to take it in the evening.  Please return for any further problems and follow-up with your doctor in the next couple days to check on your blood pressure again but make sure you take your morning blood pressure pills before you go see them.  I think the reason your blood pressure was so high now is because you did not take any of your blood pressure pills this morning.

## 2020-09-09 ENCOUNTER — Telehealth: Payer: Self-pay

## 2020-09-09 NOTE — Telephone Encounter (Signed)
Called patient to schedule hospital f/u for hypertension. Brother stated she is ok. She just forgot to take her BP medication. After taking it, she was fine. I asked if we could go ahead and schedule appointment since she has not been in office for a while. He stated his daughter will be in town next week and have her call to schedule appointment-Jennifer Carrillo

## 2020-09-15 ENCOUNTER — Other Ambulatory Visit: Payer: Self-pay

## 2020-09-15 ENCOUNTER — Encounter: Payer: Self-pay | Admitting: Internal Medicine

## 2020-09-15 ENCOUNTER — Inpatient Hospital Stay: Payer: Medicare HMO

## 2020-09-15 ENCOUNTER — Other Ambulatory Visit: Payer: Self-pay | Admitting: Internal Medicine

## 2020-09-15 VITALS — BP 165/57 | HR 60 | Temp 96.9°F | Resp 18

## 2020-09-15 DIAGNOSIS — R5383 Other fatigue: Secondary | ICD-10-CM | POA: Insufficient documentation

## 2020-09-15 DIAGNOSIS — Z79899 Other long term (current) drug therapy: Secondary | ICD-10-CM | POA: Diagnosis not present

## 2020-09-15 DIAGNOSIS — N184 Chronic kidney disease, stage 4 (severe): Secondary | ICD-10-CM | POA: Diagnosis not present

## 2020-09-15 DIAGNOSIS — D631 Anemia in chronic kidney disease: Secondary | ICD-10-CM

## 2020-09-15 DIAGNOSIS — E162 Hypoglycemia, unspecified: Secondary | ICD-10-CM | POA: Insufficient documentation

## 2020-09-15 DIAGNOSIS — Z853 Personal history of malignant neoplasm of breast: Secondary | ICD-10-CM | POA: Diagnosis not present

## 2020-09-15 DIAGNOSIS — E1122 Type 2 diabetes mellitus with diabetic chronic kidney disease: Secondary | ICD-10-CM | POA: Diagnosis not present

## 2020-09-15 DIAGNOSIS — D509 Iron deficiency anemia, unspecified: Secondary | ICD-10-CM

## 2020-09-15 MED ORDER — SODIUM CHLORIDE 0.9 % IV SOLN
Freq: Once | INTRAVENOUS | Status: AC
  Filled 2020-09-15: qty 250

## 2020-09-15 MED ORDER — IRON SUCROSE 20 MG/ML IV SOLN
200.0000 mg | Freq: Once | INTRAVENOUS | Status: AC
  Administered 2020-09-15: 200 mg via INTRAVENOUS
  Filled 2020-09-15: qty 10

## 2020-09-15 MED ORDER — SODIUM CHLORIDE 0.9 % IV SOLN: 200.0000 mg | Freq: Once | INTRAVENOUS | Status: DC

## 2020-09-15 NOTE — Telephone Encounter (Signed)
Spoke to patient's PCP Dr. Humphrey Rolls regarding the concerns for elevated blood pressure in the clinic/follow-up recommendations.  Dr. Humphrey Rolls agrees that patient's blood pressure has been chronically elevated; however-recommend the discharge instructions INCLUDE "if worsening symptoms-go to ER".

## 2020-09-15 NOTE — Patient Instructions (Signed)
[facility] [place] [facility]   Discharge Instructions: Thank you for choosing [facility] to provide your oncology and hematology care.  If you have a lab appointment with the Cancer Center, please go directly to the Cancer Center and check in at the registration area.  We strive to give you quality time with your provider. You may need to reschedule your appointment if you arrive late (15 or more minutes).  Arriving late affects you and other patients whose appointments are after yours.  Also, if you miss three or more appointments without notifying the office, you may be dismissed from the clinic at the provider's discretion.      For prescription refill requests, have your pharmacy contact our office and allow 72 hours for refills to be completed.    Today you received the following: Venofer.      BELOW ARE SYMPTOMS THAT SHOULD BE REPORTED IMMEDIATELY: *FEVER GREATER THAN 100.4 F (38 C) OR HIGHER *CHILLS OR SWEATING *NAUSEA AND VOMITING THAT IS NOT CONTROLLED WITH YOUR NAUSEA MEDICATION *UNUSUAL SHORTNESS OF BREATH *UNUSUAL BRUISING OR BLEEDING *URINARY PROBLEMS (pain or burning when urinating, or frequent urination) *BOWEL PROBLEMS (unusual diarrhea, constipation, pain near the anus) TENDERNESS IN MOUTH AND THROAT WITH OR WITHOUT PRESENCE OF ULCERS (sore throat, sores in mouth, or a toothache) UNUSUAL RASH, SWELLING OR PAIN  UNUSUAL VAGINAL DISCHARGE OR ITCHING   Items with * indicate a potential emergency and should be followed up as soon as possible or go to the Emergency Department if any problems should occur.  Should you have questions after your visit or need to cancel or reschedule your appointment, please contact [facility] [place] [facility]  [phone] and follow the prompts.  Office hours are 8:00 a.m. to 4:30 p.m. Monday - Friday. Please note that voicemails left after 4:00 p.m. may not be returned until the following  business day.  We are closed weekends and major holidays. You have access to a nurse at all times for urgent questions. Please call the main number to the clinic [phone] and follow the prompts.  For any non-urgent questions, you may also contact your provider using MyChart. We now offer e-Visits for anyone 18 and older to request care online for non-urgent symptoms. For details visit [email or web address].   Also download the MyChart app! Go to the app store, search "MyChart", open the app, select [place], and log in with your MyChart username and password.  Due to Covid, a mask is required upon entering the hospital/clinic. If you do not have a mask, one will be given to you upon arrival. For doctor visits, patients may have 1 support person aged 18 or older with them. For treatment visits, patients cannot have anyone with them due to current Covid guidelines and our immunocompromised population.  

## 2020-09-22 ENCOUNTER — Telehealth: Payer: Self-pay

## 2020-09-22 ENCOUNTER — Other Ambulatory Visit: Payer: Self-pay

## 2020-09-22 ENCOUNTER — Inpatient Hospital Stay (HOSPITAL_BASED_OUTPATIENT_CLINIC_OR_DEPARTMENT_OTHER): Payer: Medicare HMO | Admitting: Hospice and Palliative Medicine

## 2020-09-22 ENCOUNTER — Inpatient Hospital Stay: Payer: Medicare HMO

## 2020-09-22 ENCOUNTER — Other Ambulatory Visit: Payer: Self-pay | Admitting: *Deleted

## 2020-09-22 ENCOUNTER — Ambulatory Visit: Payer: Medicare HMO | Admitting: Physician Assistant

## 2020-09-22 VITALS — BP 180/70 | HR 70 | Temp 97.8°F | Resp 17

## 2020-09-22 DIAGNOSIS — D631 Anemia in chronic kidney disease: Secondary | ICD-10-CM | POA: Diagnosis not present

## 2020-09-22 DIAGNOSIS — D509 Iron deficiency anemia, unspecified: Secondary | ICD-10-CM

## 2020-09-22 DIAGNOSIS — E162 Hypoglycemia, unspecified: Secondary | ICD-10-CM | POA: Diagnosis not present

## 2020-09-22 DIAGNOSIS — N184 Chronic kidney disease, stage 4 (severe): Secondary | ICD-10-CM

## 2020-09-22 DIAGNOSIS — R5383 Other fatigue: Secondary | ICD-10-CM | POA: Diagnosis not present

## 2020-09-22 DIAGNOSIS — E1122 Type 2 diabetes mellitus with diabetic chronic kidney disease: Secondary | ICD-10-CM | POA: Diagnosis not present

## 2020-09-22 DIAGNOSIS — E119 Type 2 diabetes mellitus without complications: Secondary | ICD-10-CM

## 2020-09-22 DIAGNOSIS — Z853 Personal history of malignant neoplasm of breast: Secondary | ICD-10-CM | POA: Diagnosis not present

## 2020-09-22 DIAGNOSIS — Z79899 Other long term (current) drug therapy: Secondary | ICD-10-CM | POA: Diagnosis not present

## 2020-09-22 LAB — COMPREHENSIVE METABOLIC PANEL
ALT: 12 U/L (ref 0–44)
AST: 20 U/L (ref 15–41)
Albumin: 3.5 g/dL (ref 3.5–5.0)
Alkaline Phosphatase: 80 U/L (ref 38–126)
Anion gap: 10 (ref 5–15)
BUN: 33 mg/dL — ABNORMAL HIGH (ref 8–23)
CO2: 23 mmol/L (ref 22–32)
Calcium: 8.9 mg/dL (ref 8.9–10.3)
Chloride: 107 mmol/L (ref 98–111)
Creatinine, Ser: 3.03 mg/dL — ABNORMAL HIGH (ref 0.44–1.00)
GFR, Estimated: 15 mL/min — ABNORMAL LOW (ref >60)
Glucose, Bld: 152 mg/dL — ABNORMAL HIGH (ref 70–99)
Potassium: 4.1 mmol/L (ref 3.5–5.1)
Sodium: 140 mmol/L (ref 135–145)
Total Bilirubin: 0.4 mg/dL (ref 0.3–1.2)
Total Protein: 6.9 g/dL (ref 6.5–8.1)

## 2020-09-22 LAB — CBC WITH DIFFERENTIAL/PLATELET
Abs Immature Granulocytes: 0.03 10*3/uL (ref 0.00–0.07)
Basophils Absolute: 0 10*3/uL (ref 0.0–0.1)
Basophils Relative: 0 %
Eosinophils Absolute: 0 10*3/uL (ref 0.0–0.5)
Eosinophils Relative: 0 %
HCT: 29.6 % — ABNORMAL LOW (ref 36.0–46.0)
Hemoglobin: 8.8 g/dL — ABNORMAL LOW (ref 12.0–15.0)
Immature Granulocytes: 0 %
Lymphocytes Relative: 21 %
Lymphs Abs: 1.5 10*3/uL (ref 0.7–4.0)
MCH: 25.4 pg — ABNORMAL LOW (ref 26.0–34.0)
MCHC: 29.7 g/dL — ABNORMAL LOW (ref 30.0–36.0)
MCV: 85.3 fL (ref 80.0–100.0)
Monocytes Absolute: 0.6 10*3/uL (ref 0.1–1.0)
Monocytes Relative: 8 %
Neutro Abs: 5.2 10*3/uL (ref 1.7–7.7)
Neutrophils Relative %: 71 %
Platelets: 348 10*3/uL (ref 150–400)
RBC: 3.47 MIL/uL — ABNORMAL LOW (ref 3.87–5.11)
RDW: 17.3 % — ABNORMAL HIGH (ref 11.5–15.5)
WBC: 7.3 10*3/uL (ref 4.0–10.5)
nRBC: 0 % (ref 0.0–0.2)

## 2020-09-22 LAB — SAMPLE TO BLOOD BANK

## 2020-09-22 MED ORDER — IRON SUCROSE 20 MG/ML IV SOLN
200.0000 mg | Freq: Once | INTRAVENOUS | Status: AC
  Administered 2020-09-22: 200 mg via INTRAVENOUS
  Filled 2020-09-22: qty 10

## 2020-09-22 MED ORDER — SODIUM CHLORIDE 0.9 % IV SOLN
Freq: Once | INTRAVENOUS | Status: AC
  Filled 2020-09-22: qty 250

## 2020-09-22 MED ORDER — SODIUM CHLORIDE 0.9 % IV SOLN: 200.0000 mg | Freq: Once | INTRAVENOUS | Status: DC

## 2020-09-22 NOTE — Patient Instructions (Signed)
[facility] [place] [facility]  Discharge Instructions: Thank you for choosing [facility] to provide your oncology and hematology care.  If you have a lab appointment with the Cancer Center, please go directly to the Cancer Center and check in at the registration area.  Wear comfortable clothing and clothing appropriate for easy access to any Portacath or PICC line.   We strive to give you quality time with your provider. You may need to reschedule your appointment if you arrive late (15 or more minutes).  Arriving late affects you and other patients whose appointments are after yours.  Also, if you miss three or more appointments without notifying the office, you may be dismissed from the clinic at the provider's discretion.      For prescription refill requests, have your pharmacy contact our office and allow 72 hours for refills to be completed.    Today you received the following : Venofer   To help prevent nausea and vomiting after your treatment, we encourage you to take your nausea medication as directed.  BELOW ARE SYMPTOMS THAT SHOULD BE REPORTED IMMEDIATELY: . *FEVER GREATER THAN 100.4 F (38 C) OR HIGHER . *CHILLS OR SWEATING . *NAUSEA AND VOMITING THAT IS NOT CONTROLLED WITH YOUR NAUSEA MEDICATION . *UNUSUAL SHORTNESS OF BREATH . *UNUSUAL BRUISING OR BLEEDING . *URINARY PROBLEMS (pain or burning when urinating, or frequent urination) . *BOWEL PROBLEMS (unusual diarrhea, constipation, pain near the anus) . TENDERNESS IN MOUTH AND THROAT WITH OR WITHOUT PRESENCE OF ULCERS (sore throat, sores in mouth, or a toothache) . UNUSUAL RASH, SWELLING OR PAIN  . UNUSUAL VAGINAL DISCHARGE OR ITCHING   Items with * indicate a potential emergency and should be followed up as soon as possible or go to the Emergency Department if any problems should occur.  Please show the CHEMOTHERAPY ALERT CARD or IMMUNOTHERAPY ALERT CARD at check-in to the Emergency  Department and triage nurse.  Should you have questions after your visit or need to cancel or reschedule your appointment, please contact [facility] [place] [facility]  [phone] and follow the prompts.  Office hours are 8:00 a.m. to 4:30 p.m. Monday - Friday. Please note that voicemails left after 4:00 p.m. may not be returned until the following business day.  We are closed weekends and major holidays. You have access to a nurse at all times for urgent questions. Please call the main number to the clinic [phone] and follow the prompts.  For any non-urgent questions, you may also contact your provider using MyChart. We now offer e-Visits for anyone 18 and older to request care online for non-urgent symptoms. For details visit [email or web address].   Also download the MyChart app! Go to the app store, search "MyChart", open the app, select [place], and log in with your MyChart username and password.  Due to Covid, a mask is required upon entering the hospital/clinic. If you do not have a mask, one will be given to you upon arrival. For doctor visits, patients may have 1 support person aged 18 or older with them. For treatment visits, patients cannot have anyone with them due to current Covid guidelines and our immunocompromised population.  

## 2020-09-22 NOTE — Telephone Encounter (Signed)
Called back family of patient ans spoke with daughter Jennifer Carrillo. She stated that her mom forgot to take her insulin all day yesterday which is why her sugar dropped to 32. She went over to her moms house and gave her OJ and candy and was able to bring it back up to 90. She stated she did not believe her mom needed and appt at this time with her pcp or endo. I stated if any other issues persist to not hesitate to give [place] a call back as we are more then willing to see her mom today or any other day. She stated she appreciated [place] and would call us if further actions are needed.

## 2020-09-22 NOTE — Progress Notes (Signed)
[facility] [place]  [phone] [phone]  Patient Care Team: [name], MD as PCP - General (Internal Medicine) Edythe Clarity, [facility] as Pharmacist (Pharmacist) Cammie Sickle, MD as Consulting Physician (Hematology and Oncology)   Name of the patient: Jennifer Carrillo  [id number]  08/12/1933   Date of visit: 09/22/20  Reason for Consult:  Ms. Jennifer Carrillo is an 87 year old woman with multiple medical problems including CKD stage IV, anemia of chronic disease with history of requiring IV iron infusions, and history of breast cancer.  Patient last saw Dr. Rogue Bussing on 08/31/2020 for follow-up regarding her anemia.  Her hemoglobin was 8.  Patient was symptomatic with worsening fatigue with plan for Venofer weekly x4.  Patient was seen in the ER 09/08/2020 for hypertension while she was receiving her iron infusion.  Patient had failed to take any of her antihypertensive medications that morning.  Today, patient is received Venofer 200 mg on 9/1 and 9/8.  Patient was scheduled for her third dose of Venofer on 9/15 but was requested to be seen in [facility] as she reportedly had an episode of hypoglycemia last night.  Patient reports that she took 35 units of her 70/30 insulin last night after eating about a half a Ruben sandwich.  She does not routinely check her CBG at home.  She felt fine at bedtime and proceeded to go to sleep.  Husband apparently found her poorly responsive in the middle the night with CBG of 30.  This corrected after giving some orange juice.  Patient says that she feels fine this morning.  She denies any symptomatic complaints at present.  Denies any neurologic complaints. Denies recent fevers or illnesses. Denies any easy bleeding or bruising. Reports good appetite and denies weight loss. Denies chest pain. Denies any nausea, vomiting, constipation, or diarrhea. Denies urinary complaints. Patient  offers no further specific complaints today.    PAST MEDICAL HISTORY: Past Medical History:  Diagnosis Date   Arthritis    Asthma    Calf pain    Cancer ([place]) 2016   left breast   COPD (chronic obstructive pulmonary disease) (HCC)    Diabetes (HCC)    Discoid lupus    Gastro-esophageal reflux    Glaucoma    Hyperlipemia    Hypertension    Iron deficiency anemia    Joint pain    Leg swelling    Osteoarthritis    PVD (peripheral vascular disease) ([place])    Sinus problem    Sleep apnea    No CPAP/ Can't tolerate   Stroke ([place]) 1987    PAST SURGICAL HISTORY:  Past Surgical History:  Procedure Laterality Date   BREAST SURGERY Left    left lumpectomy   CAROTID ANGIOGRAPHY Left 06/25/2018   Procedure: CAROTID ANGIOGRAPHY, possible intervention;  Surgeon: Katha Cabal, MD;  Location: [facility];  Service: Cardiovascular;  Laterality: Left;   CAROTID ENDARTERECTOMY Right    CAROTID PTA/STENT INTERVENTION Left 06/25/2018   Procedure: CAROTID PTA/STENT INTERVENTION;  Surgeon: Katha Cabal, MD;  Location: [facility];  Service: Cardiovascular;  Laterality: Left;   CATARACT EXTRACTION Right 2013   CORONARY STENT PLACEMENT     L. L. E.   EYE SURGERY Bilateral    cataract   LEFT HEART CATH AND CORONARY ANGIOGRAPHY Left 05/06/2018   Procedure: LEFT HEART CATH AND CORONARY ANGIOGRAPHY;  Surgeon: Corey Skains, MD;  Location: [facility];  Service: Cardiovascular;  Laterality: Left;   LOWER EXTREMITY ANGIOGRAPHY Right 01/22/2018   Procedure: LOWER EXTREMITY ANGIOGRAPHY;  Surgeon: Katha Cabal, MD;  Location: [facility];  Service: Cardiovascular;  Laterality: Right;   RENAL ANGIOGRAPHY Left 02/26/2018   Procedure: RENAL ANGIOGRAPHY;  Surgeon: Katha Cabal, MD;  Location: [facility];  Service: Cardiovascular;  Laterality: Left;   STENT PLACEMENT VASCULAR ([place] [facility]) Left    stent placement on LLE   TUBAL LIGATION       HEMATOLOGY/ONCOLOGY HISTORY:  Oncology History   No history exists.    ALLERGIES:  is allergic to benazepril.  MEDICATIONS:  Current Outpatient Medications  Medication Sig Dispense Refill   ACCU-CHEK GUIDE test strip TEST BLOOD SUGAR THREE TIMES DAILY  AS  DIRECTED 300 strip 1   Accu-Chek Softclix Lancets lancets USE AS INSTRUCTED 3 TIMES A DAY 300 each 3   acetaminophen (TYLENOL) 500 MG tablet Take 1,000 mg by mouth every 6 (six) hours as needed for moderate pain or headache.     Alcohol Swabs (B-D SINGLE USE SWABS REGULAR) PADS Use as directed for 3 times daily DX E11.65 100 each 1   amLODipine (NORVASC) 10 MG tablet Take 1 tablet (10 mg total) by mouth daily at breakfast 90 tablet 1   anastrozole (ARIMIDEX) 1 MG tablet Take 1 tablet (1 mg total) by mouth daily; afternoon (dinner) 90 tablet 1   Blood Glucose Monitoring Suppl (ACCU-CHEK GUIDE ME) w/Device KIT Use as instructed. DX e11.65 1 kit 0   cholecalciferol (VITAMIN D) 25 MCG (1000 UNIT) tablet TAKE 1 TABLET EVERY MONDAY, WEDNESDAY, AND FRIDAY. (Patient not taking: Reported on 08/31/2020) 39 tablet 3   clopidogrel (PLAVIX) 75 MG tablet TAKE 1 TABLET EVERY DAY AFTERNOON (DINNER) 90 tablet 1   digoxin (LANOXIN) 0.125 MG tablet Take 1 tablet (125 mcg total) by mouth daily at breakfast. 90 tablet 1   docusate sodium (COLACE) 100 MG capsule Take 200 mg by mouth at bedtime as needed for mild constipation.  (Patient not taking: Reported on 08/31/2020)     donepezil (ARICEPT) 10 MG tablet TAKE 1 TABLET BY MOUTH AT  BEDTIME FOR MEMORY 90 tablet 0   ferrous sulfate 325 (65 FE) MG EC tablet Take 325 mg by mouth 4 (four) times a week.     fluticasone-salmeterol (ADVAIR) 100-50 MCG/ACT AEPB Inhale 1 puff into the lungs 2 (two) times daily. 1 each 3   hydrALAZINE (APRESOLINE) 25 MG tablet Take 1 tablet by mouth twice daily; breakfast, bedtime 180 tablet 1   hydrochlorothiazide (HYDRODIURIL) 12.5 MG tablet Take 1 tablet by mouth daily.      insulin NPH-regular Human (70-30) 100 UNIT/ML injection Inject 30 Units into the skin 2 (two) times daily.     isosorbide mononitrate (IMDUR) 30 MG 24 hr tablet Take 1 tablet (30 mg total) by mouth daily at breakfast 90 tablet 1   ranolazine (RANEXA) 500 MG 12 hr tablet Take 500 mg by mouth 2 (two) times daily; breakfast, Afternoon (dinner) 180 tablet 1   telmisartan (MICARDIS) 80 MG tablet Take 1 tablet (80 mg total) by mouth daily. Afternoon (lunch) 90 tablet 1   vitamin C (ASCORBIC ACID) 500 MG tablet Take by mouth daily. (Patient not taking: No sig reported)     Vitamin D, Cholecalciferol, 25 MCG (1000 UT) CAPS Take 1,000 Units by mouth every Monday, Wednesday, and Friday. (Patient not taking: Reported on 08/31/2020) 60 capsule 0   No current facility-administered medications  for this visit.    VITAL SIGNS: BP (!) 189/62 (BP Location: Right Arm, Patient Position: Sitting)   Pulse 70   Temp (!) 97 F (36.1 C) (Tympanic)   Resp 18  There were no vitals filed for this visit.  Estimated body mass index is 29 kg/m as calculated from the following:   Height as of 09/08/20: '5\' 7"'  (1.702 m).   Weight as of 09/08/20: 185 lb 3 oz (84 kg).  LABS: CBC:    Component Value Date/Time   WBC 7.3 09/22/2020 0837   HGB 8.8 (L) 09/22/2020 0837   HGB CANCELED 08/12/2020 1032   HCT 29.6 (L) 09/22/2020 0837   HCT CANCELED 08/12/2020 1032   PLT 348 09/22/2020 0837   PLT CANCELED 08/12/2020 1032   MCV 85.3 09/22/2020 0837   MCV CANCELED 08/12/2020 1032   MCV 78 (L) 05/20/2013 0509   NEUTROABS 5.2 09/22/2020 0837   NEUTROABS CANCELED 08/12/2020 1032   NEUTROABS 5.4 05/20/2013 0509   LYMPHSABS 1.5 09/22/2020 0837   LYMPHSABS CANCELED 08/12/2020 1032   LYMPHSABS 3.9 (H) 05/20/2013 0509   MONOABS 0.6 09/22/2020 0837   MONOABS 1.0 (H) 05/20/2013 0509   EOSABS 0.0 09/22/2020 0837   EOSABS CANCELED 08/12/2020 1032   EOSABS 0.2 05/20/2013 0509   BASOSABS 0.0 09/22/2020 0837   BASOSABS CANCELED  08/12/2020 1032   BASOSABS 0.1 05/20/2013 0509   Comprehensive Metabolic Panel:    Component Value Date/Time   NA 142 08/12/2020 1032   NA 142 05/20/2013 0509   K 5.6 (H) 08/12/2020 1032   K 3.7 05/20/2013 0509   CL 110 (H) 08/12/2020 1032   CL 108 (H) 05/20/2013 0509   CO2 16 (L) 08/12/2020 1032   CO2 30 05/20/2013 0509   BUN 37 (H) 08/12/2020 1032   BUN 13 05/20/2013 0509   CREATININE 2.90 (H) 08/12/2020 1032   CREATININE 0.92 05/20/2013 0509   GLUCOSE 73 08/12/2020 1032   GLUCOSE 174 (H) 06/27/2018 0427   GLUCOSE 108 (H) 05/20/2013 0509   CALCIUM 9.1 08/12/2020 1032   CALCIUM 8.9 05/20/2013 0509   AST 13 08/12/2020 1032   AST 21 05/18/2013 1305   ALT 15 08/12/2020 1032   ALT 17 05/18/2013 1305   ALKPHOS 92 08/12/2020 1032   ALKPHOS 104 05/18/2013 1305   BILITOT 0.2 08/12/2020 1032   BILITOT 0.3 05/18/2013 1305   PROT 6.4 08/12/2020 1032   PROT 8.0 05/18/2013 1305   ALBUMIN 3.6 08/12/2020 1032   ALBUMIN 3.3 (L) 05/18/2013 1305    RADIOGRAPHIC STUDIES: No results found.  PERFORMANCE STATUS (ECOG) : 1 - Symptomatic but completely ambulatory  Review of Systems Unless otherwise noted, a complete review of systems is negative.  Physical Exam General: NAD Cardiovascular: regular rate and rhythm Pulmonary: clear ant fields Abdomen: soft, nontender, + bowel sounds GU: no suprapubic tenderness Extremities: no edema, no joint deformities Skin: no rashes Neurological: Weakness but otherwise nonfocal  Assessment and Plan- Patient is a 87 y.o. female multiple medical problems including CKD stage IV, anemia of chronic disease with history of requiring IV iron infusions, and history of breast cancer.  Patient was seen in [facility] prior to her Venofer treatment for evaluation of hypoglycemia.   Hypoglycemia -serum glucose is approximately 152 today.  Patient has been had conflicting information on how much insulin she is supposed to be taking.  Husband says that she sometimes is  forgetful regarding her medications.  Patient does not routinely keep a CBG log or even  check her CBG at home.  I encouraged her to start keeping a log.  I suspect that patient needs to reduce her dose of insulin in light of her CKD and poor appetite.  We discussed ER triggers.  Together with patient's husband, we called and spoke with patient's PCP office (Dr. Humphrey Rolls) and they will see her this afternoon to discuss insulin management.  Case and plan discussed with Dr. Rogue Bussing  Patient expressed understanding and was in agreement with this plan. She also understands that She can call clinic at any time with any questions, concerns, or complaints.   Thank you for allowing me to participate in the care of this very pleasant patient.   Time Total: 25 minutes  Visit consisted of counseling and education dealing with the complex and emotionally intense issues of symptom management and palliative care in the setting of serious and potentially life-threatening illness.Greater than 50%  of this time was spent counseling and coordinating care related to the above assessment and plan.  Signed by: Altha Harm, PhD, NP-C

## 2020-09-22 NOTE — Progress Notes (Signed)
Brought pt back for scheduled Venofer. Pt states she had a " diabetic episode last night and she lost consciousness and does not remember anything."Pt states she did not seek any medical care and this is the second time this summer this has happened. Asked pt who manages her DM- she states her grandbaby. Per Dr Rogue Bussing- hold venofer and have [facility] evaluate with labs. Pt and husband aware.   After evaluated by NP- ok to receive venofer today

## 2020-09-22 NOTE — Telephone Encounter (Signed)
Patients husband called to see if we had availability to see his wife. Patient is at the cancer center and the nurse practioner talked to [place] via the patients phone. Husband and nurse practioner stated the pt had a hyperglycemic episode last night. Nurse practioner stated that the patients renal function is doing poorly as well as her appetite. Nurse practioner believes the patient would benefit from an adjustment in her insulin. Also stated that he advised the husband that next time the patient can go to the ER in case of an emergency.   I scheduled the patient for an appointment this afternoon.

## 2020-09-22 NOTE — Progress Notes (Signed)
Pt presents to [facility] from infusion area. Was supposed to receive venofer today, but expressed to RN that she was not feeling well and had a "diabetic episode" last night. Pt reports that blood sugar dropped down to 32 last night, and husband was able to get her to drink juice and candy to get blood sugar back up.

## 2020-09-28 ENCOUNTER — Telehealth: Payer: Self-pay

## 2020-09-29 ENCOUNTER — Inpatient Hospital Stay: Payer: Medicare HMO

## 2020-09-29 VITALS — BP 173/52 | HR 67 | Temp 97.7°F | Resp 20

## 2020-09-29 DIAGNOSIS — N184 Chronic kidney disease, stage 4 (severe): Secondary | ICD-10-CM | POA: Diagnosis not present

## 2020-09-29 DIAGNOSIS — D509 Iron deficiency anemia, unspecified: Secondary | ICD-10-CM

## 2020-09-29 DIAGNOSIS — D631 Anemia in chronic kidney disease: Secondary | ICD-10-CM | POA: Diagnosis not present

## 2020-09-29 DIAGNOSIS — Z853 Personal history of malignant neoplasm of breast: Secondary | ICD-10-CM | POA: Diagnosis not present

## 2020-09-29 DIAGNOSIS — E1122 Type 2 diabetes mellitus with diabetic chronic kidney disease: Secondary | ICD-10-CM | POA: Diagnosis not present

## 2020-09-29 DIAGNOSIS — R5383 Other fatigue: Secondary | ICD-10-CM | POA: Diagnosis not present

## 2020-09-29 DIAGNOSIS — Z79899 Other long term (current) drug therapy: Secondary | ICD-10-CM | POA: Diagnosis not present

## 2020-09-29 DIAGNOSIS — E162 Hypoglycemia, unspecified: Secondary | ICD-10-CM | POA: Diagnosis not present

## 2020-09-29 MED ORDER — SODIUM CHLORIDE 0.9 % IV SOLN: 200.0000 mg | Freq: Once | INTRAVENOUS | Status: DC

## 2020-09-29 MED ORDER — SODIUM CHLORIDE 0.9 % IV SOLN
Freq: Once | INTRAVENOUS | Status: AC
  Filled 2020-09-29: qty 250

## 2020-09-29 MED ORDER — IRON SUCROSE 20 MG/ML IV SOLN
200.0000 mg | Freq: Once | INTRAVENOUS | Status: AC
  Administered 2020-09-29: 200 mg via INTRAVENOUS
  Filled 2020-09-29: qty 10

## 2020-09-29 NOTE — Patient Instructions (Signed)

## 2020-10-01 ENCOUNTER — Other Ambulatory Visit: Payer: Self-pay | Admitting: Internal Medicine

## 2020-10-01 DIAGNOSIS — I10 Essential (primary) hypertension: Secondary | ICD-10-CM

## 2020-10-04 ENCOUNTER — Telehealth: Payer: Self-pay

## 2020-10-04 NOTE — Telephone Encounter (Signed)
Left message with husband for pt to confirm her pharmacy

## 2020-10-05 ENCOUNTER — Other Ambulatory Visit: Payer: Self-pay

## 2020-10-05 DIAGNOSIS — I70219 Atherosclerosis of native arteries of extremities with intermittent claudication, unspecified extremity: Secondary | ICD-10-CM

## 2020-10-05 DIAGNOSIS — R300 Dysuria: Secondary | ICD-10-CM

## 2020-10-05 DIAGNOSIS — E782 Mixed hyperlipidemia: Secondary | ICD-10-CM

## 2020-10-05 DIAGNOSIS — I10 Essential (primary) hypertension: Secondary | ICD-10-CM

## 2020-10-05 DIAGNOSIS — I701 Atherosclerosis of renal artery: Secondary | ICD-10-CM

## 2020-10-05 DIAGNOSIS — E559 Vitamin D deficiency, unspecified: Secondary | ICD-10-CM

## 2020-10-05 DIAGNOSIS — I700 Atherosclerosis of aorta: Secondary | ICD-10-CM

## 2020-10-05 DIAGNOSIS — Z1231 Encounter for screening mammogram for malignant neoplasm of breast: Secondary | ICD-10-CM

## 2020-10-05 DIAGNOSIS — E1165 Type 2 diabetes mellitus with hyperglycemia: Secondary | ICD-10-CM

## 2020-10-05 DIAGNOSIS — Z0001 Encounter for general adult medical examination with abnormal findings: Secondary | ICD-10-CM

## 2020-10-05 DIAGNOSIS — I70203 Unspecified atherosclerosis of native arteries of extremities, bilateral legs: Secondary | ICD-10-CM

## 2020-10-05 DIAGNOSIS — R413 Other amnesia: Secondary | ICD-10-CM

## 2020-10-05 DIAGNOSIS — I482 Chronic atrial fibrillation, unspecified: Secondary | ICD-10-CM

## 2020-10-05 MED ORDER — CLOPIDOGREL BISULFATE 75 MG PO TABS
ORAL_TABLET | ORAL | 1 refills | Status: DC
Start: 2020-10-05 — End: 2021-05-26

## 2020-10-05 MED ORDER — DIGOXIN 125 MCG PO TABS: ORAL_TABLET | ORAL | 1 refills | Status: DC

## 2020-10-07 ENCOUNTER — Other Ambulatory Visit: Payer: Self-pay | Admitting: *Deleted

## 2020-10-07 DIAGNOSIS — N184 Chronic kidney disease, stage 4 (severe): Secondary | ICD-10-CM

## 2020-10-07 DIAGNOSIS — D631 Anemia in chronic kidney disease: Secondary | ICD-10-CM

## 2020-10-11 DIAGNOSIS — E1122 Type 2 diabetes mellitus with diabetic chronic kidney disease: Secondary | ICD-10-CM | POA: Diagnosis not present

## 2020-10-11 DIAGNOSIS — Z794 Long term (current) use of insulin: Secondary | ICD-10-CM | POA: Diagnosis not present

## 2020-10-11 DIAGNOSIS — E11649 Type 2 diabetes mellitus with hypoglycemia without coma: Secondary | ICD-10-CM | POA: Diagnosis not present

## 2020-10-11 DIAGNOSIS — E1159 Type 2 diabetes mellitus with other circulatory complications: Secondary | ICD-10-CM | POA: Diagnosis not present

## 2020-10-11 DIAGNOSIS — N1832 Chronic kidney disease, stage 3b: Secondary | ICD-10-CM | POA: Diagnosis not present

## 2020-10-11 DIAGNOSIS — E1142 Type 2 diabetes mellitus with diabetic polyneuropathy: Secondary | ICD-10-CM | POA: Diagnosis not present

## 2020-10-11 DIAGNOSIS — E785 Hyperlipidemia, unspecified: Secondary | ICD-10-CM | POA: Diagnosis not present

## 2020-10-11 DIAGNOSIS — E1169 Type 2 diabetes mellitus with other specified complication: Secondary | ICD-10-CM | POA: Diagnosis not present

## 2020-10-12 ENCOUNTER — Inpatient Hospital Stay: Payer: Medicare HMO | Attending: Internal Medicine

## 2020-10-12 ENCOUNTER — Inpatient Hospital Stay (HOSPITAL_BASED_OUTPATIENT_CLINIC_OR_DEPARTMENT_OTHER): Payer: Medicare HMO | Admitting: Internal Medicine

## 2020-10-12 ENCOUNTER — Inpatient Hospital Stay: Payer: Medicare HMO

## 2020-10-12 VITALS — BP 183/50 | HR 55 | Resp 18

## 2020-10-12 DIAGNOSIS — Z833 Family history of diabetes mellitus: Secondary | ICD-10-CM | POA: Diagnosis not present

## 2020-10-12 DIAGNOSIS — D631 Anemia in chronic kidney disease: Secondary | ICD-10-CM

## 2020-10-12 DIAGNOSIS — Z853 Personal history of malignant neoplasm of breast: Secondary | ICD-10-CM | POA: Diagnosis not present

## 2020-10-12 DIAGNOSIS — Z806 Family history of leukemia: Secondary | ICD-10-CM | POA: Insufficient documentation

## 2020-10-12 DIAGNOSIS — D649 Anemia, unspecified: Secondary | ICD-10-CM | POA: Insufficient documentation

## 2020-10-12 DIAGNOSIS — Z87891 Personal history of nicotine dependence: Secondary | ICD-10-CM | POA: Diagnosis not present

## 2020-10-12 DIAGNOSIS — Z8249 Family history of ischemic heart disease and other diseases of the circulatory system: Secondary | ICD-10-CM | POA: Diagnosis not present

## 2020-10-12 DIAGNOSIS — M2550 Pain in unspecified joint: Secondary | ICD-10-CM | POA: Insufficient documentation

## 2020-10-12 DIAGNOSIS — N184 Chronic kidney disease, stage 4 (severe): Secondary | ICD-10-CM

## 2020-10-12 DIAGNOSIS — E1122 Type 2 diabetes mellitus with diabetic chronic kidney disease: Secondary | ICD-10-CM | POA: Insufficient documentation

## 2020-10-12 DIAGNOSIS — Z79899 Other long term (current) drug therapy: Secondary | ICD-10-CM | POA: Insufficient documentation

## 2020-10-12 DIAGNOSIS — R0602 Shortness of breath: Secondary | ICD-10-CM | POA: Diagnosis not present

## 2020-10-12 DIAGNOSIS — Z8673 Personal history of transient ischemic attack (TIA), and cerebral infarction without residual deficits: Secondary | ICD-10-CM | POA: Diagnosis not present

## 2020-10-12 DIAGNOSIS — Z801 Family history of malignant neoplasm of trachea, bronchus and lung: Secondary | ICD-10-CM | POA: Insufficient documentation

## 2020-10-12 DIAGNOSIS — D509 Iron deficiency anemia, unspecified: Secondary | ICD-10-CM

## 2020-10-12 DIAGNOSIS — R5383 Other fatigue: Secondary | ICD-10-CM | POA: Diagnosis not present

## 2020-10-12 DIAGNOSIS — M1990 Unspecified osteoarthritis, unspecified site: Secondary | ICD-10-CM | POA: Insufficient documentation

## 2020-10-12 LAB — BASIC METABOLIC PANEL
Anion gap: 9 (ref 5–15)
BUN: 39 mg/dL — ABNORMAL HIGH (ref 8–23)
CO2: 23 mmol/L (ref 22–32)
Calcium: 8.7 mg/dL — ABNORMAL LOW (ref 8.9–10.3)
Chloride: 108 mmol/L (ref 98–111)
Creatinine, Ser: 3.46 mg/dL — ABNORMAL HIGH (ref 0.44–1.00)
GFR, Estimated: 12 mL/min — ABNORMAL LOW (ref >60)
Glucose, Bld: 155 mg/dL — ABNORMAL HIGH (ref 70–99)
Potassium: 4.4 mmol/L (ref 3.5–5.1)
Sodium: 140 mmol/L (ref 135–145)

## 2020-10-12 LAB — CBC WITH DIFFERENTIAL/PLATELET
Abs Immature Granulocytes: 0.03 10*3/uL (ref 0.00–0.07)
Basophils Absolute: 0 10*3/uL (ref 0.0–0.1)
Basophils Relative: 0 %
Eosinophils Absolute: 0.1 10*3/uL (ref 0.0–0.5)
Eosinophils Relative: 1 %
HCT: 29.9 % — ABNORMAL LOW (ref 36.0–46.0)
Hemoglobin: 8.9 g/dL — ABNORMAL LOW (ref 12.0–15.0)
Immature Granulocytes: 0 %
Lymphocytes Relative: 19 %
Lymphs Abs: 1.4 10*3/uL (ref 0.7–4.0)
MCH: 25.5 pg — ABNORMAL LOW (ref 26.0–34.0)
MCHC: 29.8 g/dL — ABNORMAL LOW (ref 30.0–36.0)
MCV: 85.7 fL (ref 80.0–100.0)
Monocytes Absolute: 0.6 10*3/uL (ref 0.1–1.0)
Monocytes Relative: 8 %
Neutro Abs: 5.2 10*3/uL (ref 1.7–7.7)
Neutrophils Relative %: 72 %
Platelets: 349 10*3/uL (ref 150–400)
RBC: 3.49 MIL/uL — ABNORMAL LOW (ref 3.87–5.11)
RDW: 17.1 % — ABNORMAL HIGH (ref 11.5–15.5)
WBC: 7.3 10*3/uL (ref 4.0–10.5)
nRBC: 0 % (ref 0.0–0.2)

## 2020-10-12 LAB — SAMPLE TO BLOOD BANK

## 2020-10-12 MED ORDER — SODIUM CHLORIDE 0.9 % IV SOLN
Freq: Once | INTRAVENOUS | Status: AC
Start: 2020-10-12 — End: 2020-10-12
  Filled 2020-10-12: qty 250

## 2020-10-12 MED ORDER — SODIUM CHLORIDE 0.9 % IV SOLN: 200.0000 mg | Freq: Once | INTRAVENOUS | Status: DC

## 2020-10-12 MED ORDER — IRON SUCROSE 20 MG/ML IV SOLN
200.0000 mg | Freq: Once | INTRAVENOUS | Status: AC
  Administered 2020-10-12: 200 mg via INTRAVENOUS
  Filled 2020-10-12: qty 10

## 2020-10-12 NOTE — Assessment & Plan Note (Addendum)
#  Symptomatic anemia hemoglobin 8- likely CKD- Stage IV.  August 2022 iron saturation-5%; ferritin elevated; s/p venofer x4 infusions- but interrupted sec to elevated Blood pressures/hypoglycemic episodes.  #Proceed with Venofer today; hemoglobin 8.9-slightly improved not at the goal of 10 hemoglobin. Discussed use of erythropoietin stimulating agents like retacrit  to stimulate the bone marrow.  Discussed the potential issues with erythropoietin estimating agents-given the risk of stroke thromboembolic events/elevated blood pressure [see discussion re: elevated BP].  However, most of the serious events did not happen when the goal hematocrit is 33/hemoglobin 30.   #Etiology: Likely CKD stage IV;work up-negative for anyother causes; HOLD off bone marrow.   # poorly controlled HTN: Please check your blood pressure at home; if still elevated [> 160]-reach out to PCP for further instructions.  Bring the log of blood pressures at next visit for our review also.  Also recommend patient brings her pills the next appointment.  #Stage I ER/PR positive breast cancer [2017; Dr.Brotherton]-left side status postlumpectomy; on anastrozole-followed by PCP.   # DISPOSITION: # venofer  Today # follow up in 4 weeks- NP- labs- cbc/bmp;possible retacrit-- Dr.B

## 2020-10-12 NOTE — Progress Notes (Signed)
[place] NOTE  Patient Care Team: [name], MD as PCP - General (Internal Medicine) Edythe Clarity, [facility] as Pharmacist (Pharmacist) Cammie Sickle, MD as Consulting Physician (Hematology and Oncology)  CHIEF COMPLAINTS/PURPOSE OF CONSULTATION: ANEMIA   HEMATOLOGY HISTORY:  # ANEMIA sec to CKD [iron sat-8%; Ferritin- 152] 10/07/18: Colonoscopy/EGD polyps, diverticulae, hemorrhoids, non-obstructive Schatzki ring; NO capsule   # CKD stage IV-/ Lupus [> 50 years] ; Diabetes mellitus, type II on insulin  # Left breast cancer: IDC, 14 mm, grade 1, ER positive, PR equivocal, HER-2 2+/FISH negative; Breast, left savi guided partial mastectomy; 2017- Invasive ductal carcinoma with associated ductal carcinoma in situ (DCIS) (see synoptic) -Invasive carcinoma measures 14.0 mm in greatest dimension ;  Estrogen receptor: Positive, 91-100%, strong Progesterone receptor: Positive, 1-10%, her-2 NEG; No chemo; PCP- anastrazole.      Ref. Range 08/12/2020 10:32  Iron Latest Ref Range: 27 - 139 ug/dL 20 (L)  UIBC Latest Ref Range: 118 - 369 ug/dL 218  TIBC Latest Ref Range: 250 - 450 ug/dL 238 (L)  Ferritin Latest Ref Range: 15 - 150 ng/mL 152 (H)  Iron Saturation Latest Ref Range: 15 - 55 % 8 (LL)  Folate Latest Ref Range: >3.0 ng/mL 15.4    HISTORY OF PRESENTING ILLNESS: wheel chair; rolling walker at home; husband  Jennifer Carrillo 87 y.o.  female is here for follow up for anemia/CKD stage IV.  Patient received IV iron infusions Venofer x4.  However Venofer had been interrupted because of significantly elevated blood pressure/hypoglycemic episodes.  Patient interim also evaluated by endocrinology-reduce the dose of insulin given the hypoglycemia/worsening renal function.  She continues to be fatigued.   Review of Systems  Constitutional:  Positive for malaise/fatigue. Negative for chills, diaphoresis, fever and weight loss.  HENT:  Negative for  nosebleeds and sore throat.   Eyes:  Negative for double vision.  Respiratory:  Positive for shortness of breath. Negative for cough, hemoptysis, sputum production and wheezing.   Cardiovascular:  Negative for chest pain, palpitations, orthopnea and leg swelling.  Gastrointestinal:  Negative for abdominal pain, blood in stool, constipation, diarrhea, heartburn, melena, nausea and vomiting.  Genitourinary:  Negative for dysuria, frequency and urgency.  Musculoskeletal:  Positive for joint pain. Negative for back pain.  Skin: Negative.  Negative for itching and rash.  Neurological:  Negative for dizziness, tingling, focal weakness, weakness and headaches.  Endo/Heme/Allergies:  Does not bruise/bleed easily.  Psychiatric/Behavioral:  Negative for depression. The patient is not nervous/anxious and does not have insomnia.    MEDICAL HISTORY:  Past Medical History:  Diagnosis Date   Arthritis    Asthma    Calf pain    Cancer ([place]) 2016   left breast   COPD (chronic obstructive pulmonary disease) (HCC)    Diabetes (HCC)    Discoid lupus    Gastro-esophageal reflux    Glaucoma    Hyperlipemia    Hypertension    Iron deficiency anemia    Joint pain    Leg swelling    Osteoarthritis    PVD (peripheral vascular disease) ([place])    Sinus problem    Sleep apnea    No CPAP/ Can't tolerate   Stroke ([place]) 1987    SURGICAL HISTORY: Past Surgical History:  Procedure Laterality Date   BREAST SURGERY Left    left lumpectomy   CAROTID ANGIOGRAPHY Left 06/25/2018   Procedure: CAROTID ANGIOGRAPHY, possible intervention;  Surgeon: Katha Cabal, MD;  Location:  [facility];  Service: Cardiovascular;  Laterality: Left;   CAROTID ENDARTERECTOMY Right    CAROTID PTA/STENT INTERVENTION Left 06/25/2018   Procedure: CAROTID PTA/STENT INTERVENTION;  Surgeon: Katha Cabal, MD;  Location: [place] [facility];  Service: Cardiovascular;  Laterality: Left;   CATARACT EXTRACTION Right 2013    CORONARY STENT PLACEMENT     L. L. E.   EYE SURGERY Bilateral    cataract   LEFT HEART CATH AND CORONARY ANGIOGRAPHY Left 05/06/2018   Procedure: LEFT HEART CATH AND CORONARY ANGIOGRAPHY;  Surgeon: Corey Skains, MD;  Location: [place] [facility];  Service: Cardiovascular;  Laterality: Left;   LOWER EXTREMITY ANGIOGRAPHY Right 01/22/2018   Procedure: LOWER EXTREMITY ANGIOGRAPHY;  Surgeon: Katha Cabal, MD;  Location: [place] [facility];  Service: Cardiovascular;  Laterality: Right;   RENAL ANGIOGRAPHY Left 02/26/2018   Procedure: RENAL ANGIOGRAPHY;  Surgeon: Katha Cabal, MD;  Location: [place] [facility];  Service: Cardiovascular;  Laterality: Left;   STENT PLACEMENT VASCULAR ([facility] HX) Left    stent placement on LLE   TUBAL LIGATION      SOCIAL HISTORY: Social History   Socioeconomic History   Marital status: Married    Spouse name: Not on file   Number of children: Not on file   Years of education: Not on file   Highest education level: Not on file  Occupational History   Not on file  Tobacco Use   Smoking status: Former    Types: Cigarettes    Quit date: 61    Years since quitting: 35.7   Smokeless tobacco: Never   Tobacco comments:    quit 31 years  Vaping Use   Vaping Use: Never used  Substance and Sexual Activity   Alcohol use: No   Drug use: No   Sexual activity: Not on file  Other Topics Concern   Not on file  Social History Narrative   Walks with a rolling walker; remote hx of smoking; no alcohol; [place]- with husband/son. Daughter lives in [place].    Social Determinants of Health   Financial Resource Strain: Low Risk    Difficulty of Paying Living Expenses: Not very hard  Food Insecurity: Not on file  Transportation Needs: Not on file  Physical Activity: Not on file  Stress: Not on file  Social Connections: Not on file  Intimate Partner Violence: Not on file    FAMILY HISTORY: Family History  Problem Relation Age of  Onset   Heart disease Mother    Diabetes Other    Hypertension Other    Diabetes Sister    Lung cancer Brother    Leukemia Daughter    Bladder Cancer Neg Hx    Kidney disease Neg Hx    Prostate cancer Neg Hx     ALLERGIES:  is allergic to benazepril.  MEDICATIONS:  Current Outpatient Medications  Medication Sig Dispense Refill   ACCU-CHEK GUIDE test strip TEST BLOOD SUGAR THREE TIMES DAILY  AS  DIRECTED 300 strip 1   Accu-Chek Softclix Lancets lancets USE AS INSTRUCTED 3 TIMES A DAY 300 each 3   acetaminophen (TYLENOL) 500 MG tablet Take 1,000 mg by mouth every 6 (six) hours as needed for moderate pain or headache.     Alcohol Swabs (B-D SINGLE USE SWABS REGULAR) PADS Use as directed for 3 times daily DX E11.65 100 each 1   amLODipine (NORVASC) 10 MG tablet Take 1 tablet (10 mg total) by mouth daily  at breakfast 90 tablet 1   anastrozole (ARIMIDEX) 1 MG tablet Take 1 tablet (1 mg total) by mouth daily; afternoon (dinner) 90 tablet 1   anastrozole (ARIMIDEX) 1 MG tablet Take by mouth.     atorvastatin (LIPITOR) 40 MG tablet Take 1 tablet by mouth daily.     Blood Glucose Monitoring Suppl (ACCU-CHEK GUIDE ME) w/Device KIT Use as instructed. DX e11.65 1 kit 0   clopidogrel (PLAVIX) 75 MG tablet TAKE 1 TABLET EVERY DAY AFTERNOON (DINNER) 90 tablet 1   digoxin (LANOXIN) 0.125 MG tablet Take 1 tablet (125 mcg total) by mouth daily at breakfast. 90 tablet 1   donepezil (ARICEPT) 10 MG tablet TAKE 1 TABLET BY MOUTH AT BEDTIME FOR MEMORY (NEED MD APPOINTMENT) 90 tablet 0   fenofibrate 54 MG tablet Take 54 mg by mouth daily.     ferrous sulfate 325 (65 FE) MG EC tablet Take 325 mg by mouth 4 (four) times a week.     fluticasone-salmeterol (ADVAIR) 100-50 MCG/ACT AEPB Inhale 1 puff into the lungs 2 (two) times daily. 1 each 3   hydrALAZINE (APRESOLINE) 25 MG tablet TAKE 1 TABLET TWICE DAILY (NEED APPOINTMENT) 180 tablet 1   hydrALAZINE (APRESOLINE) 25 MG tablet Take by mouth.      hydrochlorothiazide (HYDRODIURIL) 12.5 MG tablet Take 1 tablet by mouth daily.     insulin NPH-regular Human (70-30) 100 UNIT/ML injection Inject 30 Units into the skin 2 (two) times daily.     isosorbide mononitrate (IMDUR) 30 MG 24 hr tablet Take 1 tablet (30 mg total) by mouth daily at breakfast 90 tablet 1   isosorbide mononitrate (IMDUR) 60 MG 24 hr tablet Take 1 tablet by mouth daily.     ranolazine (RANEXA) 500 MG 12 hr tablet Take 500 mg by mouth 2 (two) times daily; breakfast, Afternoon (dinner) 180 tablet 1   sodium bicarbonate 650 MG tablet Take 650 mg by mouth 2 (two) times daily.     sodium zirconium cyclosilicate (LOKELMA) 10 g PACK packet Take by mouth.     telmisartan (MICARDIS) 80 MG tablet Take 1 tablet (80 mg total) by mouth daily. Afternoon (lunch) 90 tablet 1   cholecalciferol (VITAMIN D) 25 MCG (1000 UNIT) tablet TAKE 1 TABLET EVERY MONDAY, WEDNESDAY, AND FRIDAY. (Patient not taking: No sig reported) 39 tablet 3   docusate sodium (COLACE) 100 MG capsule Take 200 mg by mouth at bedtime as needed for mild constipation.  (Patient not taking: No sig reported)     vitamin C (ASCORBIC ACID) 500 MG tablet Take by mouth daily. (Patient not taking: No sig reported)     Vitamin D, Cholecalciferol, 25 MCG (1000 UT) CAPS Take 1,000 Units by mouth every Monday, Wednesday, and Friday. (Patient not taking: No sig reported) 60 capsule 0   No current facility-administered medications for this visit.      PHYSICAL EXAMINATION:   Vitals:   10/12/20 1114  BP: (!) 169/58  Pulse: 66  Resp: 18  Temp: (!) 97.2 F (36.2 C)  SpO2: 100%   Filed Weights   10/12/20 1114  Weight: 182 lb (82.6 kg)    Physical Exam Vitals and nursing note reviewed.  HENT:     Head: Normocephalic and atraumatic.     Mouth/Throat:     Pharynx: Oropharynx is clear.  Eyes:     Extraocular Movements: Extraocular movements intact.     Pupils: Pupils are equal, round, and reactive to light.   Cardiovascular:  Rate and Rhythm: Normal rate and regular rhythm.  Pulmonary:     Comments: Decreased breath sounds bilaterally.  Abdominal:     Palpations: Abdomen is soft.  Musculoskeletal:        General: Normal range of motion.     Cervical back: Normal range of motion.  Skin:    General: Skin is warm.  Neurological:     General: No focal deficit present.     Mental Status: She is alert and oriented to person, place, and time.  Psychiatric:        Behavior: Behavior normal.        Judgment: Judgment normal.   LABORATORY DATA:  I have reviewed the data as listed Lab Results  Component Value Date   WBC 7.3 10/12/2020   HGB 8.9 (L) 10/12/2020   HCT 29.9 (L) 10/12/2020   MCV 85.7 10/12/2020   PLT 349 10/12/2020   Recent Labs    08/12/20 1032 09/22/20 0837 10/12/20 1031  NA 142 140 140  K 5.6* 4.1 4.4  CL 110* 107 108  CO2 16* 23 23  GLUCOSE 73 152* 155*  BUN 37* 33* 39*  CREATININE 2.90* 3.03* 3.46*  CALCIUM 9.1 8.9 8.7*  GFRNONAA  --  15* 12*  PROT 6.4 6.9  --   ALBUMIN 3.6 3.5  --   AST 13 20  --   ALT 15 12  --   ALKPHOS 92 80  --   BILITOT 0.2 0.4  --      No results found.  Anemia of chronic kidney failure, stage 4 (severe) (HCC) #Symptomatic anemia hemoglobin 8- likely CKD- Stage IV.  August 2022 iron saturation-5%; ferritin elevated; s/p venofer x4 infusions- but interrupted sec to elevated Blood pressures/hypoglycemic episodes.  #Proceed with Venofer today; hemoglobin 8.9-slightly improved not at the goal of 10 hemoglobin. Discussed use of erythropoietin stimulating agents like retacrit  to stimulate the bone marrow.  Discussed the potential issues with erythropoietin estimating agents-given the risk of stroke thromboembolic events/elevated blood pressure [see discussion re: elevated BP].  However, most of the serious events did not happen when the goal hematocrit is 33/hemoglobin 30.   #Etiology: Likely CKD stage IV;work up-negative for anyother  causes; HOLD off bone marrow.   # poorly controlled HTN: Please check your blood pressure at home; if still elevated [> 160]-reach out to PCP for further instructions.  Bring the log of blood pressures at next visit for our review also.  Also recommend patient brings her pills the next appointment.  #Stage I ER/PR positive breast cancer [2017; Dr.Brotherton]-left side status postlumpectomy; on anastrozole-followed by PCP.   # DISPOSITION: # venofer  Today # follow up in 4 weeks- NP- labs- cbc/bmp;possible retacrit-- Dr.B   All questions were answered. The patient knows to call the clinic with any problems, questions or concerns.    Cammie Sickle, MD 10/12/2020 4:11 PM

## 2020-10-12 NOTE — Progress Notes (Signed)
Patient tolerated Venofer treatment well. Final BP increased to 183/50, HR 55. Dr. Rogue Bussing made aware. Per Dr. Rogue Bussing, okay to discharge home. Patient and spouse educated on s/s as to when to seek emergency care. Patient verbalizes understanding and denies any further questions or concerns.

## 2020-10-19 ENCOUNTER — Other Ambulatory Visit: Payer: Self-pay

## 2020-10-19 DIAGNOSIS — I10 Essential (primary) hypertension: Secondary | ICD-10-CM

## 2020-10-19 MED ORDER — ANASTROZOLE 1 MG PO TABS: ORAL_TABLET | ORAL | 1 refills | Status: DC

## 2020-10-19 MED ORDER — AMLODIPINE BESYLATE 10 MG PO TABS: ORAL_TABLET | ORAL | 1 refills | Status: DC

## 2020-11-04 ENCOUNTER — Telehealth: Payer: Self-pay | Admitting: Internal Medicine

## 2020-11-04 NOTE — Telephone Encounter (Signed)
Husband called to see if pt could get a morning appointment. If he can't ,just leave it as is.Call back # is [phone]

## 2020-11-07 DIAGNOSIS — E1159 Type 2 diabetes mellitus with other circulatory complications: Secondary | ICD-10-CM | POA: Diagnosis not present

## 2020-11-09 ENCOUNTER — Inpatient Hospital Stay: Payer: Medicare HMO

## 2020-11-09 ENCOUNTER — Inpatient Hospital Stay (HOSPITAL_BASED_OUTPATIENT_CLINIC_OR_DEPARTMENT_OTHER): Payer: Medicare HMO | Admitting: Internal Medicine

## 2020-11-09 ENCOUNTER — Other Ambulatory Visit: Payer: Self-pay

## 2020-11-09 ENCOUNTER — Inpatient Hospital Stay: Payer: Medicare HMO | Attending: Internal Medicine

## 2020-11-09 VITALS — BP 189/48 | HR 68 | Temp 97.2°F | Resp 18 | Wt 184.0 lb

## 2020-11-09 DIAGNOSIS — Z801 Family history of malignant neoplasm of trachea, bronchus and lung: Secondary | ICD-10-CM | POA: Insufficient documentation

## 2020-11-09 DIAGNOSIS — Z853 Personal history of malignant neoplasm of breast: Secondary | ICD-10-CM | POA: Insufficient documentation

## 2020-11-09 DIAGNOSIS — Z8673 Personal history of transient ischemic attack (TIA), and cerebral infarction without residual deficits: Secondary | ICD-10-CM | POA: Insufficient documentation

## 2020-11-09 DIAGNOSIS — N184 Chronic kidney disease, stage 4 (severe): Secondary | ICD-10-CM | POA: Diagnosis not present

## 2020-11-09 DIAGNOSIS — Z7951 Long term (current) use of inhaled steroids: Secondary | ICD-10-CM | POA: Insufficient documentation

## 2020-11-09 DIAGNOSIS — Z87891 Personal history of nicotine dependence: Secondary | ICD-10-CM | POA: Insufficient documentation

## 2020-11-09 DIAGNOSIS — Z833 Family history of diabetes mellitus: Secondary | ICD-10-CM | POA: Diagnosis not present

## 2020-11-09 DIAGNOSIS — E1122 Type 2 diabetes mellitus with diabetic chronic kidney disease: Secondary | ICD-10-CM | POA: Diagnosis not present

## 2020-11-09 DIAGNOSIS — Z7984 Long term (current) use of oral hypoglycemic drugs: Secondary | ICD-10-CM | POA: Insufficient documentation

## 2020-11-09 DIAGNOSIS — Z8249 Family history of ischemic heart disease and other diseases of the circulatory system: Secondary | ICD-10-CM | POA: Insufficient documentation

## 2020-11-09 DIAGNOSIS — D631 Anemia in chronic kidney disease: Secondary | ICD-10-CM

## 2020-11-09 DIAGNOSIS — D509 Iron deficiency anemia, unspecified: Secondary | ICD-10-CM

## 2020-11-09 DIAGNOSIS — R0602 Shortness of breath: Secondary | ICD-10-CM | POA: Insufficient documentation

## 2020-11-09 DIAGNOSIS — K649 Unspecified hemorrhoids: Secondary | ICD-10-CM | POA: Diagnosis not present

## 2020-11-09 DIAGNOSIS — R5383 Other fatigue: Secondary | ICD-10-CM | POA: Insufficient documentation

## 2020-11-09 DIAGNOSIS — M2550 Pain in unspecified joint: Secondary | ICD-10-CM | POA: Diagnosis not present

## 2020-11-09 DIAGNOSIS — Z79899 Other long term (current) drug therapy: Secondary | ICD-10-CM | POA: Diagnosis not present

## 2020-11-09 DIAGNOSIS — Z806 Family history of leukemia: Secondary | ICD-10-CM | POA: Diagnosis not present

## 2020-11-09 LAB — BASIC METABOLIC PANEL
Anion gap: 10 (ref 5–15)
BUN: 48 mg/dL — ABNORMAL HIGH (ref 8–23)
CO2: 19 mmol/L — ABNORMAL LOW (ref 22–32)
Calcium: 9 mg/dL (ref 8.9–10.3)
Chloride: 109 mmol/L (ref 98–111)
Creatinine, Ser: 3.58 mg/dL — ABNORMAL HIGH (ref 0.44–1.00)
GFR, Estimated: 12 mL/min — ABNORMAL LOW (ref >60)
Glucose, Bld: 177 mg/dL — ABNORMAL HIGH (ref 70–99)
Potassium: 5.7 mmol/L — ABNORMAL HIGH (ref 3.5–5.1)
Sodium: 138 mmol/L (ref 135–145)

## 2020-11-09 LAB — CBC WITH DIFFERENTIAL/PLATELET
Abs Immature Granulocytes: 0.03 10*3/uL (ref 0.00–0.07)
Basophils Absolute: 0 10*3/uL (ref 0.0–0.1)
Basophils Relative: 1 %
Eosinophils Absolute: 0.1 10*3/uL (ref 0.0–0.5)
Eosinophils Relative: 2 %
HCT: 32.3 % — ABNORMAL LOW (ref 36.0–46.0)
Hemoglobin: 10 g/dL — ABNORMAL LOW (ref 12.0–15.0)
Immature Granulocytes: 1 %
Lymphocytes Relative: 25 %
Lymphs Abs: 1.6 10*3/uL (ref 0.7–4.0)
MCH: 26.4 pg (ref 26.0–34.0)
MCHC: 31 g/dL (ref 30.0–36.0)
MCV: 85.2 fL (ref 80.0–100.0)
Monocytes Absolute: 0.5 10*3/uL (ref 0.1–1.0)
Monocytes Relative: 7 %
Neutro Abs: 4.1 10*3/uL (ref 1.7–7.7)
Neutrophils Relative %: 64 %
Platelets: 309 10*3/uL (ref 150–400)
RBC: 3.79 MIL/uL — ABNORMAL LOW (ref 3.87–5.11)
RDW: 16.2 % — ABNORMAL HIGH (ref 11.5–15.5)
WBC: 6.3 10*3/uL (ref 4.0–10.5)
nRBC: 0 % (ref 0.0–0.2)

## 2020-11-09 NOTE — Assessment & Plan Note (Signed)
#  Symptomatic anemia hemoglobin 8- likely CKD- Stage IV.  August 2022 iron saturation-5%; ferritin elevated; s/p venofer.  # Today Hb 10.0- HOLD retacrit today. HOLD venfoer today; continue PO iron.   #Etiology: Likely CKD stage IV;work up-negative for anyother causes; HOLD off bone marrow.   # poorly controlled HTN: Please check your blood pressure at home; if still elevated [> 160]-reach out to PCP for further instructions.  Bring the log of blood pressures at next visit for our review also.  Also recommend patient brings her pills the next appointment.  #Stage I ER/PR positive breast cancer [2017; Dr.Brotherton]-left side status postlumpectomy; on anastrozole-followed by PCP.   # DISPOSITION: # No retacrit today # follow up in 2 months- NP- labs- cbc/bmp;possible retacrit or possible venofer--- Dr.B

## 2020-11-09 NOTE — Progress Notes (Signed)
[name] NOTE  Patient Care Team: [name], MD as PCP - General (Internal Medicine) Edythe Clarity, [facility] as Pharmacist (Pharmacist) Cammie Sickle, MD as Consulting Physician (Hematology and Oncology)  CHIEF COMPLAINTS/PURPOSE OF CONSULTATION: ANEMIA   HEMATOLOGY HISTORY:  # ANEMIA sec to CKD [iron sat-8%; Ferritin- 152] 10/07/18: Colonoscopy/EGD polyps, diverticulae, hemorrhoids, non-obstructive Schatzki ring; NO capsule   # CKD stage IV-/ Lupus [> 50 years] ; Diabetes mellitus, type II on insulin  # SEP 2022-hypoglycemic episodes [insulin adjusted by endocrinology]  # Left breast cancer: IDC, 14 mm, grade 1, ER positive, PR equivocal, HER-2 2+/FISH negative; Breast, left savi guided partial mastectomy; 2017- Invasive ductal carcinoma with associated ductal carcinoma in situ (DCIS) (see synoptic) -Invasive carcinoma measures 14.0 mm in greatest dimension ;  Estrogen receptor: Positive, 91-100%, strong Progesterone receptor: Positive, 1-10%, her-2 NEG; No chemo; PCP- anastrazole.      Ref. Range 08/12/2020 10:32  Iron Latest Ref Range: 27 - 139 ug/dL 20 (L)  UIBC Latest Ref Range: 118 - 369 ug/dL 218  TIBC Latest Ref Range: 250 - 450 ug/dL 238 (L)  Ferritin Latest Ref Range: 15 - 150 ng/mL 152 (H)  Iron Saturation Latest Ref Range: 15 - 55 % 8 (LL)  Folate Latest Ref Range: >3.0 ng/mL 15.4    HISTORY OF PRESENTING ILLNESS: wheel chair; rolling walker at home; husband  Jennifer Carrillo 87 y.o.  female is here for follow up for anemia/CKD stage IV.  Patient received IV iron infusions Venofer x5.   However Venofer had been interrupted because of significantly elevated blood pressure/hypoglycemic episodes.  Patient denies any blood in stools or black or stools.  Patient denies any shortness of breath except for mild fatigue.     Review of Systems  Constitutional:  Positive for malaise/fatigue. Negative for chills, diaphoresis, fever and  weight loss.  HENT:  Negative for nosebleeds and sore throat.   Eyes:  Negative for double vision.  Respiratory:  Positive for shortness of breath. Negative for cough, hemoptysis, sputum production and wheezing.   Cardiovascular:  Negative for chest pain, palpitations, orthopnea and leg swelling.  Gastrointestinal:  Negative for abdominal pain, blood in stool, constipation, diarrhea, heartburn, melena, nausea and vomiting.  Genitourinary:  Negative for dysuria, frequency and urgency.  Musculoskeletal:  Positive for joint pain. Negative for back pain.  Skin: Negative.  Negative for itching and rash.  Neurological:  Negative for dizziness, tingling, focal weakness, weakness and headaches.  Endo/Heme/Allergies:  Does not bruise/bleed easily.  Psychiatric/Behavioral:  Negative for depression. The patient is not nervous/anxious and does not have insomnia.    MEDICAL HISTORY:  Past Medical History:  Diagnosis Date   Arthritis    Asthma    Calf pain    Cancer ([place]) 2016   left breast   COPD (chronic obstructive pulmonary disease) (HCC)    Diabetes (HCC)    Discoid lupus    Gastro-esophageal reflux    Glaucoma    Hyperlipemia    Hypertension    Iron deficiency anemia    Joint pain    Leg swelling    Osteoarthritis    PVD (peripheral vascular disease) ([place])    Sinus problem    Sleep apnea    No CPAP/ Can't tolerate   Stroke ([place]) 1987    SURGICAL HISTORY: Past Surgical History:  Procedure Laterality Date   BREAST SURGERY Left    left lumpectomy   CAROTID ANGIOGRAPHY Left 06/25/2018   Procedure:  CAROTID ANGIOGRAPHY, possible intervention;  Surgeon: Katha Cabal, MD;  Location: [facility];  Service: Cardiovascular;  Laterality: Left;   CAROTID ENDARTERECTOMY Right    CAROTID PTA/STENT INTERVENTION Left 06/25/2018   Procedure: CAROTID PTA/STENT INTERVENTION;  Surgeon: Katha Cabal, MD;  Location: [facility];  Service: Cardiovascular;  Laterality:  Left;   CATARACT EXTRACTION Right 2013   CORONARY STENT PLACEMENT     L. L. E.   EYE SURGERY Bilateral    cataract   LEFT HEART CATH AND CORONARY ANGIOGRAPHY Left 05/06/2018   Procedure: LEFT HEART CATH AND CORONARY ANGIOGRAPHY;  Surgeon: Corey Skains, MD;  Location: [facility];  Service: Cardiovascular;  Laterality: Left;   LOWER EXTREMITY ANGIOGRAPHY Right 01/22/2018   Procedure: LOWER EXTREMITY ANGIOGRAPHY;  Surgeon: Katha Cabal, MD;  Location: [facility];  Service: Cardiovascular;  Laterality: Right;   RENAL ANGIOGRAPHY Left 02/26/2018   Procedure: RENAL ANGIOGRAPHY;  Surgeon: Katha Cabal, MD;  Location: [facility];  Service: Cardiovascular;  Laterality: Left;   STENT PLACEMENT VASCULAR ([facility]) Left    stent placement on LLE   TUBAL LIGATION      SOCIAL HISTORY: Social History   Socioeconomic History   Marital status: Married    Spouse name: Not on file   Number of children: Not on file   Years of education: Not on file   Highest education level: Not on file  Occupational History   Not on file  Tobacco Use   Smoking status: Former    Types: Cigarettes    Quit date: 29    Years since quitting: 35.8   Smokeless tobacco: Never   Tobacco comments:    quit 31 years  Vaping Use   Vaping Use: Never used  Substance and Sexual Activity   Alcohol use: No   Drug use: No   Sexual activity: Not on file  Other Topics Concern   Not on file  Social History Narrative   Walks with a rolling walker; remote hx of smoking; no alcohol; [place]- with husband/son. Daughter lives in [place].    Social Determinants of Health   Financial Resource Strain: Low Risk    Difficulty of Paying Living Expenses: Not very hard  Food Insecurity: Not on file  Transportation Needs: Not on file  Physical Activity: Not on file  Stress: Not on file  Social Connections: Not on file  Intimate Partner Violence: Not on file    FAMILY  HISTORY: Family History  Problem Relation Age of Onset   Heart disease Mother    Diabetes Other    Hypertension Other    Diabetes Sister    Lung cancer Brother    Leukemia Daughter    Bladder Cancer Neg Hx    Kidney disease Neg Hx    Prostate cancer Neg Hx     ALLERGIES:  is allergic to benazepril.  MEDICATIONS:  Current Outpatient Medications  Medication Sig Dispense Refill   ACCU-CHEK GUIDE test strip TEST BLOOD SUGAR THREE TIMES DAILY  AS  DIRECTED 300 strip 1   Accu-Chek Softclix Lancets lancets USE AS INSTRUCTED 3 TIMES A DAY 300 each 3   acetaminophen (TYLENOL) 500 MG tablet Take 1,000 mg by mouth every 6 (six) hours as needed for moderate pain or headache.     Alcohol Swabs (B-D SINGLE USE SWABS REGULAR) PADS Use as directed for 3 times daily DX E11.65 100 each 1   amLODipine (NORVASC)  10 MG tablet Take 1 tablet (10 mg total) by mouth daily at breakfast 90 tablet 1   anastrozole (ARIMIDEX) 1 MG tablet Take by mouth.     anastrozole (ARIMIDEX) 1 MG tablet Take 1 tablet (1 mg total) by mouth daily; afternoon (dinner) 90 tablet 1   atorvastatin (LIPITOR) 40 MG tablet Take 1 tablet by mouth daily.     Blood Glucose Monitoring Suppl (ACCU-CHEK GUIDE ME) w/Device KIT Use as instructed. DX e11.65 1 kit 0   cholecalciferol (VITAMIN D) 25 MCG (1000 UNIT) tablet TAKE 1 TABLET EVERY MONDAY, WEDNESDAY, AND FRIDAY. 39 tablet 3   clopidogrel (PLAVIX) 75 MG tablet TAKE 1 TABLET EVERY DAY AFTERNOON (DINNER) 90 tablet 1   digoxin (LANOXIN) 0.125 MG tablet Take 1 tablet (125 mcg total) by mouth daily at breakfast. 90 tablet 1   docusate sodium (COLACE) 100 MG capsule Take 200 mg by mouth at bedtime as needed for mild constipation.     donepezil (ARICEPT) 10 MG tablet TAKE 1 TABLET BY MOUTH AT BEDTIME FOR MEMORY (NEED MD APPOINTMENT) 90 tablet 0   fenofibrate 54 MG tablet Take 54 mg by mouth daily.     ferrous sulfate 325 (65 FE) MG EC tablet Take 325 mg by mouth 4 (four) times a week.      fluticasone-salmeterol (ADVAIR) 100-50 MCG/ACT AEPB Inhale 1 puff into the lungs 2 (two) times daily. 1 each 3   hydrALAZINE (APRESOLINE) 25 MG tablet TAKE 1 TABLET TWICE DAILY (NEED APPOINTMENT) 180 tablet 1   hydrALAZINE (APRESOLINE) 25 MG tablet Take by mouth.     hydrochlorothiazide (HYDRODIURIL) 12.5 MG tablet Take 1 tablet by mouth daily.     insulin NPH-regular Human (70-30) 100 UNIT/ML injection Inject 30 Units into the skin 2 (two) times daily.     isosorbide mononitrate (IMDUR) 30 MG 24 hr tablet Take 1 tablet (30 mg total) by mouth daily at breakfast 90 tablet 1   isosorbide mononitrate (IMDUR) 60 MG 24 hr tablet Take 1 tablet by mouth daily.     ranolazine (RANEXA) 500 MG 12 hr tablet Take 500 mg by mouth 2 (two) times daily; breakfast, Afternoon (dinner) 180 tablet 1   sodium bicarbonate 650 MG tablet Take 650 mg by mouth 2 (two) times daily.     sodium zirconium cyclosilicate (LOKELMA) 10 g PACK packet Take by mouth.     telmisartan (MICARDIS) 80 MG tablet Take 1 tablet (80 mg total) by mouth daily. Afternoon (lunch) 90 tablet 1   vitamin C (ASCORBIC ACID) 500 MG tablet Take by mouth daily.     Vitamin D, Cholecalciferol, 25 MCG (1000 UT) CAPS Take 1,000 Units by mouth every Monday, Wednesday, and Friday. 60 capsule 0   No current facility-administered medications for this visit.      PHYSICAL EXAMINATION:   Vitals:   11/09/20 1302 11/09/20 1306  BP:  (!) 189/48  Pulse: 68   Resp: 18   Temp: (!) 97.2 F (36.2 C)   SpO2: 100%    Filed Weights   11/09/20 1259  Weight: 184 lb (83.5 kg)    Physical Exam Vitals and nursing note reviewed.  HENT:     Head: Normocephalic and atraumatic.     Mouth/Throat:     Pharynx: Oropharynx is clear.  Eyes:     Extraocular Movements: Extraocular movements intact.     Pupils: Pupils are equal, round, and reactive to light.  Cardiovascular:     Rate and Rhythm: Normal rate and  regular rhythm.  Pulmonary:     Comments: Decreased  breath sounds bilaterally.  Abdominal:     Palpations: Abdomen is soft.  Musculoskeletal:        General: Normal range of motion.     Cervical back: Normal range of motion.  Skin:    General: Skin is warm.  Neurological:     General: No focal deficit present.     Mental Status: She is alert and oriented to person, place, and time.  Psychiatric:        Behavior: Behavior normal.        Judgment: Judgment normal.   LABORATORY DATA:  I have reviewed the data as listed Lab Results  Component Value Date   WBC 6.3 11/09/2020   HGB 10.0 (L) 11/09/2020   HCT 32.3 (L) 11/09/2020   MCV 85.2 11/09/2020   PLT 309 11/09/2020   Recent Labs    08/12/20 1032 09/22/20 0837 10/12/20 1031 11/09/20 1234  NA 142 140 140 138  K 5.6* 4.1 4.4 5.7*  CL 110* 107 108 109  CO2 16* 23 23 19*  GLUCOSE 73 152* 155* 177*  BUN 37* 33* 39* 48*  CREATININE 2.90* 3.03* 3.46* 3.58*  CALCIUM 9.1 8.9 8.7* 9.0  GFRNONAA  --  15* 12* 12*  PROT 6.4 6.9  --   --   ALBUMIN 3.6 3.5  --   --   AST 13 20  --   --   ALT 15 12  --   --   ALKPHOS 92 80  --   --   BILITOT 0.2 0.4  --   --      No results found.  Anemia of chronic kidney failure, stage 4 (severe) (HCC) #Symptomatic anemia hemoglobin 8- likely CKD- Stage IV.  August 2022 iron saturation-5%; ferritin elevated; s/p venofer.  # Today Hb 10.0- HOLD retacrit today. HOLD venfoer today; continue PO iron.   #Etiology: Likely CKD stage IV;work up-negative for anyother causes; HOLD off bone marrow.   # poorly controlled HTN: Please check your blood pressure at home; if still elevated [> 160]-reach out to PCP for further instructions.  Bring the log of blood pressures at next visit for our review also.  Also recommend patient brings her pills the next appointment.  #Stage I ER/PR positive breast cancer [2017; Dr.Brotherton]-left side status postlumpectomy; on anastrozole-followed by PCP.   # DISPOSITION: # No retacrit today # follow up in 2 months-  NP- labs- cbc/bmp;possible retacrit or possible venofer--- Dr.B   All questions were answered. The patient knows to call the clinic with any problems, questions or concerns.    Cammie Sickle, MD 11/09/2020 1:16 PM

## 2020-11-15 ENCOUNTER — Telehealth: Payer: Self-pay | Admitting: Student-PharmD

## 2020-11-15 NOTE — Progress Notes (Addendum)
Chronic Care Management Pharmacy Assistant   Name: Jennifer Carrillo  MRN: [id number] DOB: 07/01/1933  Reason for Encounter: General Disease State Call   Conditions to be addressed/monitored: CAD, HTN, DM, Osteoarthritis, Shortness of Breath  Recent office visits:  None since last CCM call on 08/30/20  Recent consult visits:  11/09/20 [facility], Kirt Boys, NP For follow-up. 10/12/20 [facility], Kirt Boys, NP For follow-up. Per note: Iron injection given. 10/11/20 Endo Judi Cong. For follow-up.  09/29/20 [facility], Kirt Boys, NP For follow-up. Per note: Iron injection given. 09/22/20 [facility], Kirt Boys, NP For follow-up. No medication changes.  09/22/20 [place]. For infusion. Per note: Iron and Sodium injection given. 09/15/20 [place]. For infusion.  Per note: Iron injection given. 08/31/20 Oncology Cammie Sickle, MD For iron deficiency. No medication changes.   Hospital visits:  09/08/20 [facility] ER Department ([address] Nena Polio, MD For hypertension. No medication changes.   Medications: Outpatient Encounter Medications as of 11/15/2020  Medication Sig Note   ACCU-CHEK GUIDE test strip TEST BLOOD SUGAR THREE TIMES DAILY  AS  DIRECTED    Accu-Chek Softclix Lancets lancets USE AS INSTRUCTED 3 TIMES A DAY    acetaminophen (TYLENOL) 500 MG tablet Take 1,000 mg by mouth every 6 (six) hours as needed for moderate pain or headache.    Alcohol Swabs (B-D SINGLE USE SWABS REGULAR) PADS Use as directed for 3 times daily DX E11.65    amLODipine (NORVASC) 10 MG tablet Take 1 tablet (10 mg total) by mouth daily at breakfast    anastrozole (ARIMIDEX) 1 MG tablet Take by mouth.    anastrozole (ARIMIDEX) 1 MG tablet Take 1 tablet (1 mg total) by mouth daily; afternoon (dinner)    atorvastatin (LIPITOR) 40 MG tablet Take 1 tablet by mouth daily.    Blood Glucose Monitoring Suppl (ACCU-CHEK GUIDE ME) w/Device KIT  Use as instructed. DX e11.65    cholecalciferol (VITAMIN D) 25 MCG (1000 UNIT) tablet TAKE 1 TABLET EVERY MONDAY, WEDNESDAY, AND FRIDAY.    clopidogrel (PLAVIX) 75 MG tablet TAKE 1 TABLET EVERY DAY AFTERNOON (DINNER)    digoxin (LANOXIN) 0.125 MG tablet Take 1 tablet (125 mcg total) by mouth daily at breakfast.    docusate sodium (COLACE) 100 MG capsule Take 200 mg by mouth at bedtime as needed for mild constipation.    donepezil (ARICEPT) 10 MG tablet TAKE 1 TABLET BY MOUTH AT BEDTIME FOR MEMORY (NEED MD APPOINTMENT)    fenofibrate 54 MG tablet Take 54 mg by mouth daily.    ferrous sulfate 325 (65 FE) MG EC tablet Take 325 mg by mouth 4 (four) times a week.    fluticasone-salmeterol (ADVAIR) 100-50 MCG/ACT AEPB Inhale 1 puff into the lungs 2 (two) times daily.    hydrALAZINE (APRESOLINE) 25 MG tablet TAKE 1 TABLET TWICE DAILY (NEED APPOINTMENT)    hydrALAZINE (APRESOLINE) 25 MG tablet Take by mouth.    hydrochlorothiazide (HYDRODIURIL) 12.5 MG tablet Take 1 tablet by mouth daily.    insulin NPH-regular Human (70-30) 100 UNIT/ML injection Inject 30 Units into the skin 2 (two) times daily. 10/12/2020: Patient is taking 20units in the morning and 20units in the evening   isosorbide mononitrate (IMDUR) 30 MG 24 hr tablet Take 1 tablet (30 mg total) by mouth daily at breakfast    isosorbide mononitrate (IMDUR) 60 MG 24 hr tablet Take 1 tablet by mouth daily.    ranolazine (RANEXA) 500  MG 12 hr tablet Take 500 mg by mouth 2 (two) times daily; breakfast, Afternoon (dinner)    sodium bicarbonate 650 MG tablet Take 650 mg by mouth 2 (two) times daily.    sodium zirconium cyclosilicate (LOKELMA) 10 g PACK packet Take by mouth.    telmisartan (MICARDIS) 80 MG tablet Take 1 tablet (80 mg total) by mouth daily. Afternoon (lunch)    vitamin C (ASCORBIC ACID) 500 MG tablet Take by mouth daily.    Vitamin D, Cholecalciferol, 25 MCG (1000 UT) CAPS Take 1,000 Units by mouth every Monday, Wednesday, and Friday.     No facility-administered encounter medications on file as of 11/15/2020.   GEN CALL: Patient stated she has pain in her both her knees, she stated she uses a cane and walker to help her walk most of the time. She was short of breath and we discussed her inhaler and how to use it and if she thinks its working for her. She stated her inhaler seems to be working its just her breathing got a little worse after catching COVID twice. I spoke with her husband and he stated he did not have any questions about her medications at this time. He stated she still has problems remembering things but other than she is doing alright.   Care Gaps:Patient needs updated labs, and eye exam.   Star Rating Drugs:Atorvastatin 40 mg 10/05/20 90 DS, Telmisartan 80 mg 08/17/20 90 DS.   Follow-Up:Pharmacist Review  Charlann Lange, RMA Clinical Pharmacist Assistant [phone]  10 minutes spent in review, coordination, and documentation. See recent ED visit for hypertension, will reach out and try to schedule a f/u visit to assess.  Reviewed by: Alena Bills, PharmD Clinical Pharmacist [phone]

## 2020-11-21 NOTE — Progress Notes (Deleted)
Chronic Care Management Pharmacy Note  11/21/2020 Name:  Jennifer Carrillo MRN:  [id number] DOB:  [birth year]  Summary:   Recommendations/Changes made from today's visit:   Plan:    Subjective: Jennifer Carrillo is an 87 y.o. year old female who is a primary patient of Humphrey Rolls, Timoteo Gaul, MD.  The CCM team was consulted for assistance with disease management and care coordination needs.    Engaged with patient by telephone for follow up visit in response to provider referral for pharmacy case management and/or care coordination services.   Consent to Services:  The patient was given the following information about Chronic Care Management services today, agreed to services, and gave verbal consent: 1. CCM service includes personalized support from designated clinical staff supervised by the primary care provider, including individualized plan of care and coordination with other care providers 2. 24/7 contact phone numbers for assistance for urgent and routine care needs. 3. Service will only be billed when office clinical staff spend 20 minutes or more in a month to coordinate care. 4. Only one practitioner may furnish and bill the service in a calendar month. 5.The patient may stop CCM services at any time (effective at the end of the month) by phone call to the office staff. 6. The patient will be responsible for cost sharing (co-pay) of up to 20% of the service fee (after annual deductible is met). Patient agreed to services and consent obtained.  Patient Care Team: [name], MD as PCP - General (Internal Medicine) Edythe Clarity, [facility] as Pharmacist (Pharmacist) Cammie Sickle, MD as Consulting Physician (Hematology and Oncology)  Recent office visits:  08/11/2020 Mylinda Latina, PA-C. For sick visit for headache. No medication changes.    Recent consult visits:  08/31/20: Hemaatology: Cammie Sickle, Iron infusion on hold due to elevated BP readings 10/12/20: Iron  infusion 10/12/20: Hemaatology: Cammie Sickle, orders placed for weekly iron infusions. 10/11/20: Endocrinology, Germain Osgood, MD: Decreased Novolin 70/30 mix to 20 units twice daily   09/29/20: Iron infusion 09/22/20: Iron Infusion: Altha Harm, PhD, NP-C for low blood sugar follow up. Referred to PCP, no med changes. 09/15/20: Iron Infusion 09/08/20: Iron infusion 08/31/20: Hemaatology: Cammie Sickle, orders placed for weekly iron infusions. 08/18/20 Nephrology Murlean Iba For follow-up. Referred to hematology for iron infusions.   Hospital visits:  09/08/20: For HTN. No med changes.   Medication History: Telmisartan 80 mg 90 DS 02/19/20   Objective:  Lab Results  Component Value Date   CREATININE 3.58 (H) 11/09/2020   BUN 48 (H) 11/09/2020   GFRNONAA 12 (L) 11/09/2020   GFRAA 31 (L) 02/13/2019   NA 138 11/09/2020   K 5.7 (H) 11/09/2020   CALCIUM 9.0 11/09/2020   CO2 19 (L) 11/09/2020   GLUCOSE 177 (H) 11/09/2020    Lab Results  Component Value Date/Time   HGBA1C 5.9 (A) 03/25/2020 03:56 PM   HGBA1C 7.6 (A) 09/08/2019 09:06 AM    Last diabetic Eye exam: No results found for: HMDIABEYEEXA  Last diabetic Foot exam: No results found for: HMDIABFOOTEX   Lab Results  Component Value Date   CHOL 171 08/12/2020   HDL 39 (L) 08/12/2020   LDLCALC 109 (H) 08/12/2020   TRIG 126 08/12/2020    Hepatic Function Latest Ref Rng & Units 09/22/2020 08/12/2020 02/13/2019  Total Protein 6.5 - 8.1 g/dL 6.9 6.4 6.2  Albumin 3.5 - 5.0 g/dL 3.5 3.6 3.5(L)  AST 15 - 41 U/L  '20 13 12  ' ALT 0 - 44 U/L '12 15 10  ' Alk Phosphatase 38 - 126 U/L 80 92 91  Total Bilirubin 0.3 - 1.2 mg/dL 0.4 0.2 0.2    Lab Results  Component Value Date/Time   TSH 3.010 08/12/2020 10:32 AM   TSH 3.500 02/13/2019 10:45 AM   FREET4 1.18 08/12/2020 10:32 AM   FREET4 1.00 02/13/2019 10:45 AM    CBC Latest Ref Rng & Units 11/09/2020 10/12/2020 09/22/2020  WBC 4.0 - 10.5 K/uL 6.3 7.3 7.3  Hemoglobin  12.0 - 15.0 g/dL 10.0(L) 8.9(L) 8.8(L)  Hematocrit 36.0 - 46.0 % 32.3(L) 29.9(L) 29.6(L)  Platelets 150 - 400 K/uL 309 349 348    Lab Results  Component Value Date/Time   VD25OH 16.4 (L) 08/12/2020 10:32 AM   VD25OH 12.2 (L) 07/15/2019 11:48 AM    Clinical ASCVD: Yes  The ASCVD Risk score (Arnett DK, et al., 2019) failed to calculate for the following reasons:   The 2019 ASCVD risk score is only valid for ages 8 to 42   The patient has a prior MI or stroke diagnosis    Depression screen [facility] 2/9 03/24/2020 09/08/2019 02/13/2019  Decreased Interest 0 0 1  Down, Depressed, Hopeless 0 0 1  PHQ - 2 Score 0 0 2  Altered sleeping - - 0  Tired, decreased energy - - 2  Change in appetite - - 1  Feeling bad or failure about yourself  - - 1  Trouble concentrating - - 3  Moving slowly or fidgety/restless - - 1  Suicidal thoughts - - 0  PHQ-9 Score - - 10     Social History   Tobacco Use  Smoking Status Former   Types: Cigarettes   Quit date: 1987   Years since quitting: 35.8  Smokeless Tobacco Never  Tobacco Comments   quit 31 years   BP Readings from Last 3 Encounters:  11/09/20 (!) 189/48  10/12/20 (!) 183/50  10/12/20 (!) 169/58   Pulse Readings from Last 3 Encounters:  11/09/20 68  10/12/20 (!) 55  10/12/20 66   Wt Readings from Last 3 Encounters:  11/09/20 184 lb (83.5 kg)  10/12/20 182 lb (82.6 kg)  09/08/20 185 lb 3 oz (84 kg)   BMI Readings from Last 3 Encounters:  11/09/20 28.82 kg/m  10/12/20 28.51 kg/m  09/08/20 29.00 kg/m    Assessment/Interventions: Review of patient past medical history, allergies, medications, health status, including review of consultants reports, laboratory and other test data, was performed as part of comprehensive evaluation and provision of chronic care management services.   SDOH:  (Social Determinants of Health) assessments and interventions performed: Yes  Financial Resource Strain: Low Risk    Difficulty of Paying Living  Expenses: Not very hard     SDOH Screenings   Alcohol Screen: Not on file  Depression (PHQ2-9): Low Risk    PHQ-2 Score: 0  Financial Resource Strain: Low Risk    Difficulty of Paying Living Expenses: Not very hard  Food Insecurity: Not on file  Housing: Not on file  Physical Activity: Not on file  Social Connections: Not on file  Stress: Not on file  Tobacco Use: Medium Risk   Smoking Tobacco Use: Former   Smokeless Tobacco Use: Never   Passive Exposure: Not on file  Transportation Needs: Not on file    [place]  Allergies  Allergen Reactions   Benazepril Nausea Only    Medications Reviewed Today  Reviewed by Drue Dun, RN (Registered Nurse) on 11/09/20 at 1259  Med List Status: <None>   Medication Order Taking? Sig Documenting Provider Last Dose Status Informant  ACCU-CHEK GUIDE test strip [id number] Yes TEST BLOOD SUGAR THREE TIMES DAILY  AS  DIRECTED [name], MD Taking Active   Accu-Chek Softclix Lancets lancets [id number] Yes USE AS INSTRUCTED 3 TIMES A DAY [name], MD Taking Active   acetaminophen (TYLENOL) 500 MG tablet [id number] Yes Take 1,000 mg by mouth every 6 (six) hours as needed for moderate pain or headache. [provider] Taking Active Self  Alcohol Swabs (B-D SINGLE USE SWABS REGULAR) PADS [id number] Yes Use as directed for 3 times daily DX E11.65 Kendell Bane, NP Taking Active   amLODipine (NORVASC) 10 MG tablet [id number] Yes Take 1 tablet (10 mg total) by mouth daily at breakfast [name], MD Taking Active   anastrozole (ARIMIDEX) 1 MG tablet [id number] Yes Take by mouth. [provider] Taking Active   anastrozole (ARIMIDEX) 1 MG tablet [id number] Yes Take 1 tablet (1 mg total) by mouth daily; afternoon (dinner) [name], MD Taking Active   atorvastatin (LIPITOR) 40 MG tablet [id number] Yes Take 1 tablet by mouth daily. [provider] Taking Active   Blood Glucose Monitoring Suppl  (ACCU-CHEK GUIDE ME) w/Device KIT [id number] Yes Use as instructed. DX e11.65 Kendell Bane, NP Taking Active   cholecalciferol (VITAMIN D) 25 MCG (1000 UNIT) tablet [id number] Yes TAKE 1 TABLET EVERY MONDAY, WEDNESDAY, AND FRIDAY. Luiz Ochoa, NP Taking Active   clopidogrel (PLAVIX) 75 MG tablet [id number] Yes TAKE 1 TABLET EVERY DAY AFTERNOON (DINNER) McDonough, Lauren K, PA-C Taking Active   digoxin (LANOXIN) 0.125 MG tablet [id number] Yes Take 1 tablet (125 mcg total) by mouth daily at breakfast. McDonough, Si Gaul, PA-C Taking Active   docusate sodium (COLACE) 100 MG capsule [id number] Yes Take 200 mg by mouth at bedtime as needed for mild constipation. [provider] Taking Active   donepezil (ARICEPT) 10 MG tablet [id number] Yes TAKE 1 TABLET BY MOUTH AT BEDTIME FOR MEMORY (NEED MD APPOINTMENT) McDonough, Si Gaul, PA-C Taking Active   fenofibrate 54 MG tablet [id number] Yes Take 54 mg by mouth daily. [provider] Taking Active   ferrous sulfate 325 (65 FE) MG EC tablet [id number] Yes Take 325 mg by mouth 4 (four) times a week. [provider] Taking Active   fluticasone-salmeterol (ADVAIR) 100-50 MCG/ACT AEPB [id number] Yes Inhale 1 puff into the lungs 2 (two) times daily. [name], MD Taking Active   hydrALAZINE (APRESOLINE) 25 MG tablet [id number] Yes TAKE 1 TABLET TWICE DAILY (NEED APPOINTMENT) McDonough, Si Gaul, PA-C Taking Active   hydrALAZINE (APRESOLINE) 25 MG tablet [id number] Yes Take by mouth. [provider] Taking Active   hydrochlorothiazide (HYDRODIURIL) 12.5 MG tablet [id number] Yes Take 1 tablet by mouth daily. [provider] Taking Active   insulin NPH-regular Human (70-30) 100 UNIT/ML injection [id number] Yes Inject 30 Units into the skin 2 (two) times daily. [provider] Taking Active Self           Med Note Buford Dresser, Benjaman Pott Oct 12, 2020 11:11 AM) Patient is taking 20units in the morning and 20units in  the evening  isosorbide mononitrate (IMDUR) 30 MG 24 hr tablet [id number] Yes Take 1 tablet (30 mg total) by mouth daily at breakfast [name], MD Taking Active   isosorbide mononitrate (IMDUR) 60 MG  24 hr tablet [id number] Yes Take 1 tablet by mouth daily. [provider] Taking Active   ranolazine (RANEXA) 500 MG 12 hr tablet [id number] Yes Take 500 mg by mouth 2 (two) times daily; breakfast, Afternoon (dinner) [name], MD Taking Active   sodium bicarbonate 650 MG tablet [id number] Yes Take 650 mg by mouth 2 (two) times daily. [provider] Taking Active   sodium zirconium cyclosilicate (LOKELMA) 10 g PACK packet [id number] Yes Take by mouth. [provider] Taking Active   telmisartan (MICARDIS) 80 MG tablet [id number] Yes Take 1 tablet (80 mg total) by mouth daily. Afternoon (lunch) [name], MD Taking Active   vitamin C (ASCORBIC ACID) 500 MG tablet [id number] Yes Take by mouth daily. [provider] Taking Active   Vitamin D, Cholecalciferol, 25 MCG (1000 UT) CAPS [id number] Yes Take 1,000 Units by mouth every Monday, Wednesday, and Friday. Luiz Ochoa, NP Taking Active             Patient Active Problem List   Diagnosis Date Noted   Anemia of chronic kidney failure, stage 4 (severe) ([place]) 08/31/2020   Carotid stenosis, symptomatic w/o infarct 06/25/2018   Hypertension 06/18/2018   Carbuncle and furuncle of buttock 06/18/2018   Coronary artery disease involving native coronary artery of native heart 05/19/2018   Old inferior wall myocardial infarction 05/19/2018   Stable angina ([place]) 03/31/2018   Abnormal ECG 03/07/2018   Benign essential HTN 03/07/2018   Bilateral carotid artery stenosis 03/07/2018   SOBOE (shortness of breath on exertion) 03/07/2018   Atherosclerotic peripheral vascular disease with intermittent claudication ([place]) 03/07/2018   Carotid stenosis 02/26/2018   Renal artery stenosis ([facility]) 02/26/2018   Anemia  02/05/2018   Leg pain 01/13/2018   PAD (peripheral artery disease) ([place]) 01/13/2018   Diabetes mellitus without complication ([place]) 93/26/7124   Renovascular hypertension 04/09/2017   Primary osteoarthritis of both knees 03/12/2017   Malignant neoplasm of breast in female, estrogen receptor positive ([place]) 12/16/2015   Iron deficiency anemia 12/08/2015    Immunization History  Administered Date(s) Administered   Influenza Nasal 09/28/2015, 10/05/2016, 10/08/2017   Influenza, High Dose Seasonal PF 10/19/2017, 09/26/2018, 10/27/2020   Influenza, Quadrivalent, Recombinant, Inj, Pf 12/11/2018   Influenza-Unspecified 09/28/2015, 10/05/2016, 10/19/2017, 10/16/2019   PFIZER(Purple Top)SARS-COV-2 Vaccination 03/04/2019, 03/25/2019, 10/31/2019   Pneumococcal Polysaccharide-23 08/02/2017   Pneumococcal-Unspecified 09/26/2015    Conditions to be addressed/monitored:  CAD, HTN, DM, Osteoarthritis, Shortness of Breath  There are no care plans that you recently modified to display for this patient.    Medication Assistance: Application for novo nordisk  medication assistance program. in process.  Anticipated assistance start date unknown.  See plan of care for additional detail.  Compliance/Adherence/Medication fill history: Care Gaps: Foot and eye exams needed  Star-Rating Drugs: Telmisartan 80 mg 90 DS 02/19/20  Patient's preferred pharmacy is:  [id number] [place] - [id number]: [phone]: [phone] [id number]: [phone]: [phone] Uses pill box? Yes Pt endorses 100% compliance  We discussed: Benefits of medication synchronization, packaging and delivery as well as enhanced pharmacist oversight with [employer]. Patient decided to: Continue current medication management  strategy  Care Plan and Follow Up Patient Decision:  Patient agrees to Care Plan and Follow-up.  Plan: The care management team  will reach out to the patient again over the next 90 days.  Alena Bills, PharmD Clinical Pharmacist [facility] [phone]

## 2020-11-23 ENCOUNTER — Telehealth: Payer: Medicare HMO

## 2020-11-26 ENCOUNTER — Emergency Department
Admission: EM | Admit: 2020-11-26 | Discharge: 2020-11-26 | Disposition: A | Discharge status: Home / Self Care | Payer: Medicare HMO | Attending: Student in an Organized Health Care Education/Training Program | Admitting: Student in an Organized Health Care Education/Training Program

## 2020-11-26 ENCOUNTER — Other Ambulatory Visit: Payer: Self-pay

## 2020-11-26 ENCOUNTER — Emergency Department: Payer: Medicare HMO

## 2020-11-26 DIAGNOSIS — R079 Chest pain, unspecified: Secondary | ICD-10-CM | POA: Diagnosis not present

## 2020-11-26 DIAGNOSIS — M19012 Primary osteoarthritis, left shoulder: Secondary | ICD-10-CM | POA: Diagnosis not present

## 2020-11-26 DIAGNOSIS — I2510 Atherosclerotic heart disease of native coronary artery without angina pectoris: Secondary | ICD-10-CM | POA: Insufficient documentation

## 2020-11-26 DIAGNOSIS — Z7951 Long term (current) use of inhaled steroids: Secondary | ICD-10-CM | POA: Insufficient documentation

## 2020-11-26 DIAGNOSIS — Z794 Long term (current) use of insulin: Secondary | ICD-10-CM | POA: Diagnosis not present

## 2020-11-26 DIAGNOSIS — Z955 Presence of coronary angioplasty implant and graft: Secondary | ICD-10-CM | POA: Diagnosis not present

## 2020-11-26 DIAGNOSIS — Z87891 Personal history of nicotine dependence: Secondary | ICD-10-CM | POA: Diagnosis not present

## 2020-11-26 DIAGNOSIS — E119 Type 2 diabetes mellitus without complications: Secondary | ICD-10-CM | POA: Insufficient documentation

## 2020-11-26 DIAGNOSIS — J449 Chronic obstructive pulmonary disease, unspecified: Secondary | ICD-10-CM | POA: Insufficient documentation

## 2020-11-26 DIAGNOSIS — R001 Bradycardia, unspecified: Secondary | ICD-10-CM | POA: Diagnosis not present

## 2020-11-26 DIAGNOSIS — N184 Chronic kidney disease, stage 4 (severe): Secondary | ICD-10-CM | POA: Insufficient documentation

## 2020-11-26 DIAGNOSIS — Z79899 Other long term (current) drug therapy: Secondary | ICD-10-CM | POA: Diagnosis not present

## 2020-11-26 DIAGNOSIS — J45909 Unspecified asthma, uncomplicated: Secondary | ICD-10-CM | POA: Diagnosis not present

## 2020-11-26 DIAGNOSIS — M25512 Pain in left shoulder: Secondary | ICD-10-CM | POA: Insufficient documentation

## 2020-11-26 DIAGNOSIS — I129 Hypertensive chronic kidney disease with stage 1 through stage 4 chronic kidney disease, or unspecified chronic kidney disease: Secondary | ICD-10-CM | POA: Diagnosis not present

## 2020-11-26 DIAGNOSIS — Z853 Personal history of malignant neoplasm of breast: Secondary | ICD-10-CM | POA: Insufficient documentation

## 2020-11-26 DIAGNOSIS — D631 Anemia in chronic kidney disease: Secondary | ICD-10-CM | POA: Insufficient documentation

## 2020-11-26 LAB — BASIC METABOLIC PANEL
Anion gap: 8 (ref 5–15)
BUN: 43 mg/dL — ABNORMAL HIGH (ref 8–23)
CO2: 19 mmol/L — ABNORMAL LOW (ref 22–32)
Calcium: 9.1 mg/dL (ref 8.9–10.3)
Chloride: 111 mmol/L (ref 98–111)
Creatinine, Ser: 3.83 mg/dL — ABNORMAL HIGH (ref 0.44–1.00)
GFR, Estimated: 11 mL/min — ABNORMAL LOW (ref >60)
Glucose, Bld: 110 mg/dL — ABNORMAL HIGH (ref 70–99)
Potassium: 5.1 mmol/L (ref 3.5–5.1)
Sodium: 138 mmol/L (ref 135–145)

## 2020-11-26 LAB — CBC
HCT: 35.3 % — ABNORMAL LOW (ref 36.0–46.0)
Hemoglobin: 11 g/dL — ABNORMAL LOW (ref 12.0–15.0)
MCH: 26.3 pg (ref 26.0–34.0)
MCHC: 31.2 g/dL (ref 30.0–36.0)
MCV: 84.4 fL (ref 80.0–100.0)
Platelets: 304 10*3/uL (ref 150–400)
RBC: 4.18 MIL/uL (ref 3.87–5.11)
RDW: 16.3 % — ABNORMAL HIGH (ref 11.5–15.5)
WBC: 5.6 10*3/uL (ref 4.0–10.5)
nRBC: 0 % (ref 0.0–0.2)

## 2020-11-26 LAB — TROPONIN I (HIGH SENSITIVITY)
Troponin I (High Sensitivity): 19 ng/L — ABNORMAL HIGH (ref <18)
Troponin I (High Sensitivity): 18 ng/L — ABNORMAL HIGH (ref <18)

## 2020-11-26 MED ORDER — IRBESARTAN 150 MG PO TABS
300.0000 mg | ORAL_TABLET | Freq: Every day | ORAL | Status: DC
  Administered 2020-11-26: 300 mg via ORAL
  Filled 2020-11-26: qty 2

## 2020-11-26 MED ORDER — LIDOCAINE 5 % EX PTCH
1.0000 | MEDICATED_PATCH | CUTANEOUS | Status: DC
  Administered 2020-11-26: 1 via TRANSDERMAL
  Filled 2020-11-26: qty 1

## 2020-11-26 MED ORDER — ISOSORBIDE MONONITRATE ER 60 MG PO TB24
30.0000 mg | ORAL_TABLET | Freq: Every day | ORAL | Status: DC
  Administered 2020-11-26: 30 mg via ORAL
  Filled 2020-11-26: qty 1

## 2020-11-26 MED ORDER — HYDRALAZINE HCL 50 MG PO TABS
50.0000 mg | ORAL_TABLET | Freq: Once | ORAL | Status: AC
  Administered 2020-11-26: 50 mg via ORAL
  Filled 2020-11-26: qty 1

## 2020-11-26 MED ORDER — LIDOCAINE 5 % EX PTCH: 1.0000 | MEDICATED_PATCH | Freq: Two times a day (BID) | CUTANEOUS | 0 refills | Status: DC

## 2020-11-26 NOTE — ED Provider Notes (Signed)
[facility] Emergency Department Provider Note    Event Date/Time   First MD Initiated Contact with Patient 11/26/20 1045     (approximate)  I have reviewed the triage vital signs and the nursing notes.   HISTORY  Chief Complaint Breast Pain    HPI Jennifer Carrillo is a 87 y.o. female below listed past medical history presents to the ER for evaluation of several days of left shoulder and chest pain.  Worse with movement.  Was putting weight granddaughter yesterday, was picking her options 31 pounds.  She denies any pain with deep inspiration.  Denies any leg swelling.  No nausea or vomiting.  Denies any shortness of breath.  Did not take anything for pain.  Did not take any of her blood pressure medications this morning.  Past Medical History:  Diagnosis Date   Arthritis    Asthma    Calf pain    Cancer ([place]) 2016   left breast   COPD (chronic obstructive pulmonary disease) (HCC)    Diabetes (HCC)    Discoid lupus    Gastro-esophageal reflux    Glaucoma    Hyperlipemia    Hypertension    Iron deficiency anemia    Joint pain    Leg swelling    Osteoarthritis    PVD (peripheral vascular disease) (HCC)    Sinus problem    Sleep apnea    No CPAP/ Can't tolerate   Stroke ([place]) 1987   Family History  Problem Relation Age of Onset   Heart disease Mother    Diabetes Other    Hypertension Other    Diabetes Sister    Lung cancer Brother    Leukemia Daughter    Bladder Cancer Neg Hx    Kidney disease Neg Hx    Prostate cancer Neg Hx    Past Surgical History:  Procedure Laterality Date   BREAST SURGERY Left    left lumpectomy   CAROTID ANGIOGRAPHY Left 06/25/2018   Procedure: CAROTID ANGIOGRAPHY, possible intervention;  Surgeon: Katha Cabal, MD;  Location: [place] [facility];  Service: Cardiovascular;  Laterality: Left;   CAROTID ENDARTERECTOMY Right    CAROTID PTA/STENT INTERVENTION Left 06/25/2018   Procedure: CAROTID PTA/STENT  INTERVENTION;  Surgeon: Katha Cabal, MD;  Location: [place] [facility];  Service: Cardiovascular;  Laterality: Left;   CATARACT EXTRACTION Right 2013   CORONARY STENT PLACEMENT     L. L. E.   EYE SURGERY Bilateral    cataract   LEFT HEART CATH AND CORONARY ANGIOGRAPHY Left 05/06/2018   Procedure: LEFT HEART CATH AND CORONARY ANGIOGRAPHY;  Surgeon: Corey Skains, MD;  Location: [place] [facility];  Service: Cardiovascular;  Laterality: Left;   LOWER EXTREMITY ANGIOGRAPHY Right 01/22/2018   Procedure: LOWER EXTREMITY ANGIOGRAPHY;  Surgeon: Katha Cabal, MD;  Location: [place] [facility];  Service: Cardiovascular;  Laterality: Right;   RENAL ANGIOGRAPHY Left 02/26/2018   Procedure: RENAL ANGIOGRAPHY;  Surgeon: Katha Cabal, MD;  Location: [place] [facility];  Service: Cardiovascular;  Laterality: Left;   STENT PLACEMENT VASCULAR ([place] HX) Left    stent placement on LLE   TUBAL LIGATION     Patient Active Problem List   Diagnosis Date Noted   Anemia of chronic kidney failure, stage 4 (severe) ([place]) 08/31/2020   Carotid stenosis, symptomatic w/o infarct 06/25/2018   Hypertension 06/18/2018   Carbuncle and furuncle of buttock 06/18/2018   Coronary artery disease involving native  coronary artery of native heart 05/19/2018   Old inferior wall myocardial infarction 05/19/2018   Stable angina ([place]) 03/31/2018   Abnormal ECG 03/07/2018   Benign essential HTN 03/07/2018   Bilateral carotid artery stenosis 03/07/2018   SOBOE (shortness of breath on exertion) 03/07/2018   Atherosclerotic peripheral vascular disease with intermittent claudication ([place]) 03/07/2018   Carotid stenosis 02/26/2018   Renal artery stenosis (HCC) 02/26/2018   Anemia 02/05/2018   Leg pain 01/13/2018   PAD (peripheral artery disease) ([place]) 01/13/2018   Diabetes mellitus without complication ([place]) 70/01/7492   Renovascular hypertension 04/09/2017   Primary osteoarthritis of both knees  03/12/2017   Malignant neoplasm of breast in female, estrogen receptor positive ([place]) 12/16/2015   Iron deficiency anemia 12/08/2015      Prior to Admission medications   Medication Sig Start Date End Date Taking? Authorizing Provider  Accu-Chek Softclix Lancets lancets USE AS INSTRUCTED 3 TIMES A DAY 09/22/19  Yes [name], MD  acetaminophen (TYLENOL) 500 MG tablet Take 1,000 mg by mouth every 6 (six) hours as needed for moderate pain or headache.   Yes [provider]  amLODipine (NORVASC) 10 MG tablet Take 1 tablet (10 mg total) by mouth daily at breakfast 10/19/20  Yes [name], MD  anastrozole (ARIMIDEX) 1 MG tablet Take 1 tablet (1 mg total) by mouth daily; afternoon (dinner) 10/19/20  Yes [name], MD  clopidogrel (PLAVIX) 75 MG tablet TAKE 1 TABLET EVERY DAY AFTERNOON (DINNER) 10/05/20  Yes McDonough, Lauren K, PA-C  digoxin (LANOXIN) 0.125 MG tablet Take 1 tablet (125 mcg total) by mouth daily at breakfast. 10/05/20  Yes McDonough, Lauren K, PA-C  donepezil (ARICEPT) 10 MG tablet TAKE 1 TABLET BY MOUTH AT BEDTIME FOR MEMORY (NEED MD APPOINTMENT) 10/03/20  Yes McDonough, Lauren K, PA-C  ferrous sulfate 325 (65 FE) MG EC tablet Take 325 mg by mouth 4 (four) times a week.   Yes [provider]  hydrALAZINE (APRESOLINE) 25 MG tablet TAKE 1 TABLET TWICE DAILY (NEED APPOINTMENT) 10/03/20  Yes McDonough, Lauren K, PA-C  insulin NPH-regular Human (70-30) 100 UNIT/ML injection Inject 30 Units into the skin 2 (two) times daily.   Yes [provider]  isosorbide mononitrate (IMDUR) 30 MG 24 hr tablet Take 1 tablet (30 mg total) by mouth daily at breakfast 08/16/20  Yes [name], MD  lidocaine (LIDODERM) 5 % Place 1 patch onto the skin every 12 (twelve) hours. Remove & Discard patch within 12 hours or as directed by MD 11/26/20 11/26/21 Yes Merlyn Lot, MD  ranolazine (RANEXA) 500 MG 12 hr tablet Take 500 mg by mouth 2 (two) times daily; breakfast,  Afternoon (dinner) 08/16/20  Yes [name], MD  sodium bicarbonate 650 MG tablet Take 650 mg by mouth 2 (two) times daily. 09/02/20  Yes [provider]  telmisartan (MICARDIS) 80 MG tablet Take 1 tablet (80 mg total) by mouth daily. Afternoon (lunch) 08/16/20  Yes [name], MD  vitamin C (ASCORBIC ACID) 500 MG tablet Take by mouth daily.   Yes [provider]  ACCU-CHEK GUIDE test strip TEST BLOOD SUGAR THREE TIMES DAILY  AS  DIRECTED 04/27/20   [name], MD  Alcohol Swabs (B-D SINGLE USE SWABS REGULAR) PADS Use as directed for 3 times daily DX E11.65 06/22/19   Kendell Bane, NP  atorvastatin (LIPITOR) 40 MG tablet Take 1 tablet by mouth daily. Patient not taking: Reported on 11/26/2020 11/02/19  [provider]  Blood Glucose Monitoring Suppl (ACCU-CHEK GUIDE ME) w/Device KIT Use as instructed. DX e11.65 06/18/19   Kendell Bane, NP  cholecalciferol (VITAMIN D) 25 MCG (1000 UNIT) tablet TAKE 1 TABLET EVERY MONDAY, WEDNESDAY, AND FRIDAY. Patient not taking: Reported on 11/26/2020 11/04/19   Luiz Ochoa, NP  docusate sodium (COLACE) 100 MG capsule Take 200 mg by mouth at bedtime as needed for mild constipation. Patient not taking: Reported on 11/26/2020    [provider]  fenofibrate 54 MG tablet Take 54 mg by mouth daily. Patient not taking: Reported on 11/26/2020 07/29/20   [provider]  fluticasone-salmeterol (ADVAIR) 100-50 MCG/ACT AEPB Inhale 1 puff into the lungs 2 (two) times daily. 07/19/20   [name], MD  hydrochlorothiazide (HYDRODIURIL) 12.5 MG tablet Take 1 tablet by mouth daily. Patient not taking: Reported on 11/26/2020 04/03/18   [provider]  sodium zirconium cyclosilicate (LOKELMA) 10 g PACK packet Take by mouth. Patient not taking: Reported on 11/26/2020 09/07/20   [provider]  Vitamin D, Cholecalciferol, 25 MCG (1000 UT) CAPS Take 1,000 Units by mouth every Monday, Wednesday, and  Friday. Patient not taking: Reported on 11/26/2020 09/09/19   Luiz Ochoa, NP    Allergies Benazepril    Social History Social History   Tobacco Use   Smoking status: Former    Types: Cigarettes    Quit date: 1987    Years since quitting: 35.9   Smokeless tobacco: Never   Tobacco comments:    quit 31 years  Vaping Use   Vaping Use: Never used  Substance Use Topics   Alcohol use: No   Drug use: No    Review of Systems Patient denies headaches, rhinorrhea, blurry vision, numbness, shortness of breath, chest pain, edema, cough, abdominal pain, nausea, vomiting, diarrhea, dysuria, fevers, rashes or hallucinations unless otherwise stated above in HPI. ____________________________________________   PHYSICAL EXAM:  VITAL SIGNS: Vitals:   11/26/20 1346 11/26/20 1534  BP: (!) 221/48 (!) 198/64  Pulse: 66 60  Resp: 20 19  Temp:    SpO2: 100% 100%    Constitutional: Alert and oriented.  Eyes: Conjunctivae are normal.  Head: Atraumatic. Nose: No congestion/rhinnorhea. Mouth/Throat: Mucous membranes are moist.   Neck: No stridor. Painless ROM.  Cardiovascular: Normal rate, regular rhythm. Grossly normal heart sounds.  Good peripheral circulation. Respiratory: Normal respiratory effort.  No retractions. Lungs CTAB. Gastrointestinal: Soft and nontender. No distention. No abdominal bruits. No CVA tenderness. Genitourinary:  Musculoskeletal: pain reproduced with palpation of left posterior shoulder.  No deformity or overlying erythema.No lower extremity tenderness nor edema.  No joint effusions. Neurologic:  Normal speech and language. No gross focal neurologic deficits are appreciated. No facial droop Skin:  Skin is warm, dry and intact. No rash noted. Psychiatric: Mood and affect are normal. Speech and behavior are normal.  ____________________________________________   LABS (all labs ordered are listed, but only abnormal results are displayed)  Results for orders  placed or performed during the hospital encounter of 11/26/20 (from the past 24 hour(s))  Basic metabolic panel     Status: Abnormal   Collection Time: 11/26/20 10:27 AM  Result Value Ref Range   Sodium 138 135 - 145 mmol/L   Potassium 5.1 3.5 - 5.1 mmol/L   Chloride 111 98 - 111 mmol/L   CO2 19 (L) 22 - 32 mmol/L   Glucose, Bld 110 (H) 70 - 99 mg/dL   BUN 43 (H) 8 - 23  mg/dL   Creatinine, Ser 3.83 (H) 0.44 - 1.00 mg/dL   Calcium 9.1 8.9 - 10.3 mg/dL   GFR, Estimated 11 (L) >60 mL/min   Anion gap 8 5 - 15  CBC     Status: Abnormal   Collection Time: 11/26/20 10:27 AM  Result Value Ref Range   WBC 5.6 4.0 - 10.5 K/uL   RBC 4.18 3.87 - 5.11 MIL/uL   Hemoglobin 11.0 (L) 12.0 - 15.0 g/dL   HCT 35.3 (L) 36.0 - 46.0 %   MCV 84.4 80.0 - 100.0 fL   MCH 26.3 26.0 - 34.0 pg   MCHC 31.2 30.0 - 36.0 g/dL   RDW 16.3 (H) 11.5 - 15.5 %   Platelets 304 150 - 400 K/uL   nRBC 0.0 0.0 - 0.2 %  Troponin I (High Sensitivity)     Status: Abnormal   Collection Time: 11/26/20 10:27 AM  Result Value Ref Range   Troponin I (High Sensitivity) 19 (H) <18 ng/L  Troponin I (High Sensitivity)     Status: Abnormal   Collection Time: 11/26/20 12:34 PM  Result Value Ref Range   Troponin I (High Sensitivity) 18 (H) <18 ng/L   ____________________________________________  EKG My review and personal interpretation at Time: 10:30   Indication: shoulder pain  Rate: 60  Rhythm: sinus Axis: normal Other: nonspecific st and t wave abn, no stemi, no depressions  My review and personal interpretation at Time: 12:41  Indication: shoulder pain  Rate: 60  Rhythm: sinus Axis: normal Other: nonspecific st and t wave abn, no stemi, no depressions no significant change to morphology when compared to previous tracing from 11/2019  ____________________________________________  RADIOLOGY  I personally reviewed all radiographic images ordered to evaluate for the above acute complaints and reviewed radiology reports and  findings.  These findings were personally discussed with the patient.  Please see medical record for radiology report.  ____________________________________________   PROCEDURES  Procedure(s) performed:  Procedures    Critical Care performed: no ____________________________________________   INITIAL IMPRESSION / ASSESSMENT AND PLAN / ED COURSE  Pertinent labs & imaging results that were available during my care of the patient were reviewed by me and considered in my medical decision making (see chart for details).   DDX: ACS, pericarditis,  pe, dissection, pna, bronchitis, costochondritis, msk strain   Jennifer Carrillo is a 87 y.o. who presents to the ED with symptoms as described above.  Patient clinically nontoxic.  No acute distress.  Pain is reproduced with palpation and with movement.  EKG with some nonspecific changes.  No diaphoresis and her symptoms have been going on for several days.  Initial troponin borderline elevated but with her chronic kidney disease may be her baseline.  Will observe for repeat troponin.  I have a lower suspicion for ACS.  Does not seem clinically consistent with dissection or PE.  Not consistent with septic arthritis.  X-rays are reassuring.  Will give analgesia and monitor.  Also noted to be hypertensive but did not take her a.m. meds therefore we will give her morning antihypertensives  Clinical Course as of 11/26/20 1543  Sat Nov 26, 2020  1345 Patient reassessed.  She is feeling improved.  Pain seems to be primarily musculoskeletal in nature is very reproducible with palpation she is improving with Lidoderm.  I have a lower suspicion for ACS given stable troponin. [PR]  1423 Repeat blood pressure improved after patient took her a.m. medicines.  She feels improved.  States  that she was playing with her granddaughter last night but is having this discomfort for 3 days and thinks she may have pulled something.  This is more likely musculoskeletal pain I  do believe she stable appropriate for outpatient follow-up. [PR]    Clinical Course User Index [PR] Merlyn Lot, MD    The patient was evaluated in Emergency Department today for the symptoms described in the history of present illness. He/she was evaluated in the context of the global COVID-19 pandemic, which necessitated consideration that the patient might be at risk for infection with the SARS-CoV-2 virus that causes COVID-19. Institutional protocols and algorithms that pertain to the evaluation of patients at risk for COVID-19 are in a state of rapid change based on information released by regulatory bodies including the CDC and federal and state organizations. These policies and algorithms were followed during the patient's care in the ED.  As part of my medical decision making, I reviewed the following data within the [facility] notes reviewed and incorporated, Labs reviewed, notes from prior ED visits and [place] Controlled Substance Database   ____________________________________________   FINAL CLINICAL IMPRESSION(S) / ED DIAGNOSES  Final diagnoses:  Acute pain of left shoulder      NEW MEDICATIONS STARTED DURING THIS VISIT:  New Prescriptions   LIDOCAINE (LIDODERM) 5 %    Place 1 patch onto the skin every 12 (twelve) hours. Remove & Discard patch within 12 hours or as directed by MD     Note:  This document was prepared using Dragon voice recognition software and may include unintentional dictation errors.    Merlyn Lot, MD 11/26/20 [phone]

## 2020-11-26 NOTE — ED Notes (Signed)
D/C and reasons to return to ED discussed with pt, pt verbalized understanding, daughter also verbalized understanding. NAD noted. MD aware of hypertension on D/C. Pt denies dizziness or headache on time of D/C.

## 2020-11-26 NOTE — ED Triage Notes (Signed)
Pt to ED for left breast pain that started Thursday night. Pt shows pain radiating to center chest and left arm.  Reports left breast lumpectomy a couple years ago  Hx HTN, has not taken meds today

## 2020-12-05 ENCOUNTER — Ambulatory Visit (INDEPENDENT_AMBULATORY_CARE_PROVIDER_SITE_OTHER): Payer: Medicare HMO | Admitting: Physician Assistant

## 2020-12-05 ENCOUNTER — Encounter: Payer: Self-pay | Admitting: Physician Assistant

## 2020-12-05 ENCOUNTER — Other Ambulatory Visit: Payer: Self-pay

## 2020-12-05 DIAGNOSIS — I10 Essential (primary) hypertension: Secondary | ICD-10-CM | POA: Diagnosis not present

## 2020-12-05 DIAGNOSIS — B029 Zoster without complications: Secondary | ICD-10-CM

## 2020-12-05 MED ORDER — VALACYCLOVIR HCL 1 G PO TABS: 1000.0000 mg | ORAL_TABLET | Freq: Two times a day (BID) | ORAL | 0 refills | Status: AC

## 2020-12-05 MED ORDER — GABAPENTIN 100 MG PO CAPS: 100.0000 mg | ORAL_CAPSULE | Freq: Three times a day (TID) | ORAL | 0 refills | Status: DC

## 2020-12-05 NOTE — Progress Notes (Signed)
[facility] [place], [place] [address]  Internal MEDICINE  Office Visit Note  Patient Name: Jennifer Carrillo  [id number]  [id number]  Date of Service: 12/06/2020  Chief Complaint  Patient presents with   Acute Home Visit    Rash under left breast      HPI Pt is here for a sick visit. -Rash under arm and along back and then along left breast since last Wednesday or Thursday. Had gone to ED on the 19th prior to that prior due to some pain in that arm and along chest, but no rash at the time. States the pain has improved some, but is still there. Rash also seems to be improving. -Rash appears consistent with T1 dermatome -she has never had shingles before  Current Medication:  Outpatient Encounter Medications as of 12/05/2020  Medication Sig Note   ACCU-CHEK GUIDE test strip TEST BLOOD SUGAR THREE TIMES DAILY  AS  DIRECTED    Accu-Chek Softclix Lancets lancets USE AS INSTRUCTED 3 TIMES A DAY    acetaminophen (TYLENOL) 500 MG tablet Take 1,000 mg by mouth every 6 (six) hours as needed for moderate pain or headache.    Alcohol Swabs (B-D SINGLE USE SWABS REGULAR) PADS Use as directed for 3 times daily DX E11.65    amLODipine (NORVASC) 10 MG tablet Take 1 tablet (10 mg total) by mouth daily at breakfast    anastrozole (ARIMIDEX) 1 MG tablet Take 1 tablet (1 mg total) by mouth daily; afternoon (dinner)    atorvastatin (LIPITOR) 40 MG tablet Take 1 tablet by mouth daily.    Blood Glucose Monitoring Suppl (ACCU-CHEK GUIDE ME) w/Device KIT Use as instructed. DX e11.65    cholecalciferol (VITAMIN D) 25 MCG (1000 UNIT) tablet TAKE 1 TABLET EVERY MONDAY, WEDNESDAY, AND FRIDAY.    clopidogrel (PLAVIX) 75 MG tablet TAKE 1 TABLET EVERY DAY AFTERNOON (DINNER)    digoxin (LANOXIN) 0.125 MG tablet Take 1 tablet (125 mcg total) by mouth daily at breakfast.    docusate sodium (COLACE) 100 MG capsule Take 200 mg by mouth at bedtime as needed for mild constipation.     donepezil (ARICEPT) 10 MG tablet TAKE 1 TABLET BY MOUTH AT BEDTIME FOR MEMORY (NEED MD APPOINTMENT)    fenofibrate 54 MG tablet Take 54 mg by mouth daily.    ferrous sulfate 325 (65 FE) MG EC tablet Take 325 mg by mouth 4 (four) times a week.    fluticasone-salmeterol (ADVAIR) 100-50 MCG/ACT AEPB Inhale 1 puff into the lungs 2 (two) times daily. 11/26/2020: Pt taking as needed   gabapentin (NEURONTIN) 100 MG capsule Take 1 capsule (100 mg total) by mouth 3 (three) times daily.    hydrALAZINE (APRESOLINE) 25 MG tablet TAKE 1 TABLET TWICE DAILY (NEED APPOINTMENT)    hydrochlorothiazide (HYDRODIURIL) 12.5 MG tablet Take 1 tablet by mouth daily.    insulin NPH-regular Human (70-30) 100 UNIT/ML injection Inject 30 Units into the skin 2 (two) times daily. 10/12/2020: Patient is taking 20units in the morning and 20units in the evening   isosorbide mononitrate (IMDUR) 30 MG 24 hr tablet Take 1 tablet (30 mg total) by mouth daily at breakfast    lidocaine (LIDODERM) 5 % Place 1 patch onto the skin every 12 (twelve) hours. Remove & Discard patch within 12 hours or as directed by MD    ranolazine (RANEXA) 500 MG 12 hr tablet Take 500 mg by mouth 2 (two) times daily; breakfast, Afternoon (dinner)  sodium bicarbonate 650 MG tablet Take 650 mg by mouth 2 (two) times daily.    sodium zirconium cyclosilicate (LOKELMA) 10 g PACK packet Take by mouth.    telmisartan (MICARDIS) 80 MG tablet Take 1 tablet (80 mg total) by mouth daily. Afternoon (lunch)    valACYclovir (VALTREX) 1000 MG tablet Take 1 tablet (1,000 mg total) by mouth 2 (two) times daily for 7 days.    vitamin C (ASCORBIC ACID) 500 MG tablet Take by mouth daily.    Vitamin D, Cholecalciferol, 25 MCG (1000 UT) CAPS Take 1,000 Units by mouth every Monday, Wednesday, and Friday.    No facility-administered encounter medications on file as of 12/05/2020.      Medical History: Past Medical History:  Diagnosis Date   Arthritis    Asthma    Calf pain     Cancer ([place]) 2016   left breast   COPD (chronic obstructive pulmonary disease) (HCC)    Diabetes (HCC)    Discoid lupus    Gastro-esophageal reflux    Glaucoma    Hyperlipemia    Hypertension    Iron deficiency anemia    Joint pain    Leg swelling    Osteoarthritis    PVD (peripheral vascular disease) (HCC)    Sinus problem    Sleep apnea    No CPAP/ Can't tolerate   Stroke ([place]) 1987     Vital Signs: BP (!) 150/60   Pulse 78   Temp 97.8 F (36.6 C)   Resp 16   Ht '5\' 7"'  (1.702 m)   Wt 179 lb (81.2 kg)   SpO2 98%   BMI 28.04 kg/m    Review of Systems  Constitutional:  Negative for fatigue and fever.  HENT:  Negative for congestion, mouth sores and postnasal drip.   Respiratory:  Negative for cough.   Cardiovascular:  Negative for chest pain.  Genitourinary:  Negative for flank pain.  Skin:  Positive for rash.       Painful rash along left breast, under arm and back  Psychiatric/Behavioral: Negative.     Physical Exam Vitals and nursing note reviewed.  Constitutional:      General: She is not in acute distress.    Appearance: She is well-developed. She is obese. She is not diaphoretic.  HENT:     Head: Normocephalic and atraumatic.     Mouth/Throat:     Pharynx: No oropharyngeal exudate.  Eyes:     Pupils: Pupils are equal, round, and reactive to light.  Neck:     Thyroid: No thyromegaly.     Vascular: No JVD.     Trachea: No tracheal deviation.  Cardiovascular:     Rate and Rhythm: Normal rate and regular rhythm.     Heart sounds: Normal heart sounds. No murmur heard.   No friction rub. No gallop.  Pulmonary:     Effort: Pulmonary effort is normal. No respiratory distress.     Breath sounds: No wheezing or rales.  Chest:     Chest wall: No tenderness.  Abdominal:     General: Bowel sounds are normal.     Palpations: Abdomen is soft.  Musculoskeletal:        General: Normal range of motion.     Cervical back: Normal range of motion and neck  supple.  Lymphadenopathy:     Cervical: No cervical adenopathy.  Skin:    General: Skin is warm and dry.     Findings: Rash present.  Comments: Vesicular clusters throughout T1 dermatome, under axilla, top of back and along  upper portion of left breast, mostly crusted over  Neurological:     Mental Status: She is alert and oriented to person, place, and time.     Cranial Nerves: No cranial nerve deficit.  Psychiatric:        Behavior: Behavior normal.        Thought Content: Thought content normal.        Judgment: Judgment normal.      Assessment/Plan: 1. Herpes zoster without complication Will go ahead and start valtrex BID for 7 days and may take gabapentin up to 3 times daily as needed for nerve pain. Given instructions on avoiding direct contact with others until all vesicles crusted over which they are close too already. - valACYclovir (VALTREX) 1000 MG tablet; Take 1 tablet (1,000 mg total) by mouth 2 (two) times daily for 7 days.  Dispense: 14 tablet; Refill: 0 - gabapentin (NEURONTIN) 100 MG capsule; Take 1 capsule (100 mg total) by mouth 3 (three) times daily.  Dispense: 90 capsule; Refill: 0  2. Primary hypertension Elevated in office, likely secondary to pain and acute illness. Will monitor and continue current medications   General Counseling: Jennifer Carrillo understanding of the findings of todays visit and agrees with plan of treatment. I have discussed any further diagnostic evaluation that may be needed or ordered today. We also reviewed her medications today. she has been encouraged to call the office with any questions or concerns that should arise related to todays visit.    Counseling:    No orders of the defined types were placed in this encounter.   Meds ordered this encounter  Medications   valACYclovir (VALTREX) 1000 MG tablet    Sig: Take 1 tablet (1,000 mg total) by mouth 2 (two) times daily for 7 days.    Dispense:  14 tablet     Refill:  0   gabapentin (NEURONTIN) 100 MG capsule    Sig: Take 1 capsule (100 mg total) by mouth 3 (three) times daily.    Dispense:  90 capsule    Refill:  0    Time spent:30 Minutes

## 2020-12-06 NOTE — Patient Instructions (Signed)

## 2020-12-07 DIAGNOSIS — I10 Essential (primary) hypertension: Secondary | ICD-10-CM | POA: Diagnosis not present

## 2020-12-07 DIAGNOSIS — N184 Chronic kidney disease, stage 4 (severe): Secondary | ICD-10-CM | POA: Diagnosis not present

## 2020-12-07 DIAGNOSIS — E119 Type 2 diabetes mellitus without complications: Secondary | ICD-10-CM | POA: Diagnosis not present

## 2020-12-12 DIAGNOSIS — E1122 Type 2 diabetes mellitus with diabetic chronic kidney disease: Secondary | ICD-10-CM | POA: Diagnosis not present

## 2020-12-12 DIAGNOSIS — R801 Persistent proteinuria, unspecified: Secondary | ICD-10-CM | POA: Diagnosis not present

## 2020-12-12 DIAGNOSIS — I10 Essential (primary) hypertension: Secondary | ICD-10-CM | POA: Diagnosis not present

## 2020-12-12 DIAGNOSIS — N185 Chronic kidney disease, stage 5: Secondary | ICD-10-CM | POA: Diagnosis not present

## 2020-12-18 ENCOUNTER — Inpatient Hospital Stay
Admission: EM | Admit: 2020-12-18 | Discharge: 2021-01-01 | DRG: 553 | Disposition: A | Discharge status: Home Health | Payer: Medicare HMO | Attending: Internal Medicine | Admitting: Internal Medicine

## 2020-12-18 ENCOUNTER — Encounter: Payer: Self-pay | Admitting: Emergency Medicine

## 2020-12-18 ENCOUNTER — Emergency Department: Payer: Medicare HMO

## 2020-12-18 ENCOUNTER — Other Ambulatory Visit: Payer: Self-pay

## 2020-12-18 DIAGNOSIS — M6282 Rhabdomyolysis: Secondary | ICD-10-CM | POA: Diagnosis not present

## 2020-12-18 DIAGNOSIS — I120 Hypertensive chronic kidney disease with stage 5 chronic kidney disease or end stage renal disease: Secondary | ICD-10-CM | POA: Diagnosis not present

## 2020-12-18 DIAGNOSIS — E1165 Type 2 diabetes mellitus with hyperglycemia: Secondary | ICD-10-CM | POA: Diagnosis not present

## 2020-12-18 DIAGNOSIS — N281 Cyst of kidney, acquired: Secondary | ICD-10-CM | POA: Diagnosis not present

## 2020-12-18 DIAGNOSIS — N179 Acute kidney failure, unspecified: Secondary | ICD-10-CM | POA: Diagnosis present

## 2020-12-18 DIAGNOSIS — E1122 Type 2 diabetes mellitus with diabetic chronic kidney disease: Secondary | ICD-10-CM | POA: Diagnosis present

## 2020-12-18 DIAGNOSIS — Z20822 Contact with and (suspected) exposure to covid-19: Secondary | ICD-10-CM | POA: Diagnosis not present

## 2020-12-18 DIAGNOSIS — I2510 Atherosclerotic heart disease of native coronary artery without angina pectoris: Secondary | ICD-10-CM | POA: Diagnosis present

## 2020-12-18 DIAGNOSIS — I739 Peripheral vascular disease, unspecified: Secondary | ICD-10-CM | POA: Diagnosis present

## 2020-12-18 DIAGNOSIS — S37009A Unspecified injury of unspecified kidney, initial encounter: Secondary | ICD-10-CM

## 2020-12-18 DIAGNOSIS — Z7951 Long term (current) use of inhaled steroids: Secondary | ICD-10-CM

## 2020-12-18 DIAGNOSIS — N184 Chronic kidney disease, stage 4 (severe): Secondary | ICD-10-CM | POA: Diagnosis not present

## 2020-12-18 DIAGNOSIS — Z833 Family history of diabetes mellitus: Secondary | ICD-10-CM

## 2020-12-18 DIAGNOSIS — R6520 Severe sepsis without septic shock: Secondary | ICD-10-CM

## 2020-12-18 DIAGNOSIS — Z8673 Personal history of transient ischemic attack (TIA), and cerebral infarction without residual deficits: Secondary | ICD-10-CM

## 2020-12-18 DIAGNOSIS — G9341 Metabolic encephalopathy: Secondary | ICD-10-CM | POA: Diagnosis present

## 2020-12-18 DIAGNOSIS — D631 Anemia in chronic kidney disease: Secondary | ICD-10-CM | POA: Diagnosis present

## 2020-12-18 DIAGNOSIS — M064 Inflammatory polyarthropathy: Principal | ICD-10-CM | POA: Diagnosis present

## 2020-12-18 DIAGNOSIS — N189 Chronic kidney disease, unspecified: Secondary | ICD-10-CM | POA: Diagnosis not present

## 2020-12-18 DIAGNOSIS — Z79811 Long term (current) use of aromatase inhibitors: Secondary | ICD-10-CM

## 2020-12-18 DIAGNOSIS — H409 Unspecified glaucoma: Secondary | ICD-10-CM | POA: Diagnosis present

## 2020-12-18 DIAGNOSIS — I10 Essential (primary) hypertension: Secondary | ICD-10-CM | POA: Diagnosis not present

## 2020-12-18 DIAGNOSIS — M545 Low back pain, unspecified: Secondary | ICD-10-CM

## 2020-12-18 DIAGNOSIS — G8929 Other chronic pain: Secondary | ICD-10-CM | POA: Diagnosis present

## 2020-12-18 DIAGNOSIS — Z794 Long term (current) use of insulin: Secondary | ICD-10-CM

## 2020-12-18 DIAGNOSIS — M25562 Pain in left knee: Secondary | ICD-10-CM | POA: Diagnosis not present

## 2020-12-18 DIAGNOSIS — Z8739 Personal history of other diseases of the musculoskeletal system and connective tissue: Secondary | ICD-10-CM | POA: Diagnosis not present

## 2020-12-18 DIAGNOSIS — C50912 Malignant neoplasm of unspecified site of left female breast: Secondary | ICD-10-CM | POA: Diagnosis present

## 2020-12-18 DIAGNOSIS — N185 Chronic kidney disease, stage 5: Secondary | ICD-10-CM | POA: Diagnosis not present

## 2020-12-18 DIAGNOSIS — B029 Zoster without complications: Secondary | ICD-10-CM | POA: Diagnosis present

## 2020-12-18 DIAGNOSIS — L8990 Pressure ulcer of unspecified site, unspecified stage: Secondary | ICD-10-CM | POA: Insufficient documentation

## 2020-12-18 DIAGNOSIS — M79604 Pain in right leg: Secondary | ICD-10-CM

## 2020-12-18 DIAGNOSIS — J449 Chronic obstructive pulmonary disease, unspecified: Secondary | ICD-10-CM | POA: Diagnosis present

## 2020-12-18 DIAGNOSIS — N2581 Secondary hyperparathyroidism of renal origin: Secondary | ICD-10-CM | POA: Diagnosis not present

## 2020-12-18 DIAGNOSIS — A419 Sepsis, unspecified organism: Secondary | ICD-10-CM | POA: Diagnosis not present

## 2020-12-18 DIAGNOSIS — I129 Hypertensive chronic kidney disease with stage 1 through stage 4 chronic kidney disease, or unspecified chronic kidney disease: Secondary | ICD-10-CM | POA: Diagnosis not present

## 2020-12-18 DIAGNOSIS — N186 End stage renal disease: Secondary | ICD-10-CM

## 2020-12-18 DIAGNOSIS — E1151 Type 2 diabetes mellitus with diabetic peripheral angiopathy without gangrene: Secondary | ICD-10-CM | POA: Diagnosis present

## 2020-12-18 DIAGNOSIS — I161 Hypertensive emergency: Secondary | ICD-10-CM | POA: Diagnosis not present

## 2020-12-18 DIAGNOSIS — K219 Gastro-esophageal reflux disease without esophagitis: Secondary | ICD-10-CM | POA: Diagnosis present

## 2020-12-18 DIAGNOSIS — R102 Pelvic and perineal pain: Secondary | ICD-10-CM | POA: Diagnosis not present

## 2020-12-18 DIAGNOSIS — K5900 Constipation, unspecified: Secondary | ICD-10-CM | POA: Diagnosis present

## 2020-12-18 DIAGNOSIS — M25561 Pain in right knee: Secondary | ICD-10-CM | POA: Diagnosis not present

## 2020-12-18 DIAGNOSIS — R52 Pain, unspecified: Secondary | ICD-10-CM | POA: Diagnosis not present

## 2020-12-18 DIAGNOSIS — M25462 Effusion, left knee: Secondary | ICD-10-CM | POA: Diagnosis not present

## 2020-12-18 DIAGNOSIS — M25461 Effusion, right knee: Secondary | ICD-10-CM | POA: Diagnosis not present

## 2020-12-18 DIAGNOSIS — I252 Old myocardial infarction: Secondary | ICD-10-CM

## 2020-12-18 DIAGNOSIS — E872 Acidosis, unspecified: Secondary | ICD-10-CM | POA: Diagnosis present

## 2020-12-18 DIAGNOSIS — Z87891 Personal history of nicotine dependence: Secondary | ICD-10-CM

## 2020-12-18 DIAGNOSIS — Z992 Dependence on renal dialysis: Secondary | ICD-10-CM

## 2020-12-18 DIAGNOSIS — E785 Hyperlipidemia, unspecified: Secondary | ICD-10-CM | POA: Diagnosis present

## 2020-12-18 DIAGNOSIS — Z7902 Long term (current) use of antithrombotics/antiplatelets: Secondary | ICD-10-CM

## 2020-12-18 DIAGNOSIS — M79605 Pain in left leg: Secondary | ICD-10-CM | POA: Diagnosis not present

## 2020-12-18 DIAGNOSIS — Z79899 Other long term (current) drug therapy: Secondary | ICD-10-CM

## 2020-12-18 DIAGNOSIS — R531 Weakness: Secondary | ICD-10-CM | POA: Diagnosis not present

## 2020-12-18 DIAGNOSIS — Z01818 Encounter for other preprocedural examination: Secondary | ICD-10-CM | POA: Diagnosis not present

## 2020-12-18 DIAGNOSIS — N170 Acute kidney failure with tubular necrosis: Secondary | ICD-10-CM | POA: Diagnosis not present

## 2020-12-18 DIAGNOSIS — M1120 Other chondrocalcinosis, unspecified site: Secondary | ICD-10-CM | POA: Diagnosis not present

## 2020-12-18 DIAGNOSIS — G4733 Obstructive sleep apnea (adult) (pediatric): Secondary | ICD-10-CM | POA: Diagnosis present

## 2020-12-18 DIAGNOSIS — Z90710 Acquired absence of both cervix and uterus: Secondary | ICD-10-CM

## 2020-12-18 DIAGNOSIS — M5416 Radiculopathy, lumbar region: Secondary | ICD-10-CM | POA: Diagnosis not present

## 2020-12-18 DIAGNOSIS — L930 Discoid lupus erythematosus: Secondary | ICD-10-CM | POA: Diagnosis present

## 2020-12-18 DIAGNOSIS — M1990 Unspecified osteoarthritis, unspecified site: Secondary | ICD-10-CM | POA: Diagnosis present

## 2020-12-18 DIAGNOSIS — Z8249 Family history of ischemic heart disease and other diseases of the circulatory system: Secondary | ICD-10-CM

## 2020-12-18 DIAGNOSIS — Z888 Allergy status to other drugs, medicaments and biological substances status: Secondary | ICD-10-CM

## 2020-12-18 DIAGNOSIS — E860 Dehydration: Secondary | ICD-10-CM | POA: Diagnosis present

## 2020-12-18 DIAGNOSIS — Z955 Presence of coronary angioplasty implant and graft: Secondary | ICD-10-CM

## 2020-12-18 DIAGNOSIS — Z8619 Personal history of other infectious and parasitic diseases: Secondary | ICD-10-CM

## 2020-12-18 LAB — COMPREHENSIVE METABOLIC PANEL
ALT: 15 U/L (ref 0–44)
AST: 28 U/L (ref 15–41)
Albumin: 3.3 g/dL — ABNORMAL LOW (ref 3.5–5.0)
Alkaline Phosphatase: 91 U/L (ref 38–126)
Anion gap: 10 (ref 5–15)
BUN: 46 mg/dL — ABNORMAL HIGH (ref 8–23)
CO2: 18 mmol/L — ABNORMAL LOW (ref 22–32)
Calcium: 9 mg/dL (ref 8.9–10.3)
Chloride: 110 mmol/L (ref 98–111)
Creatinine, Ser: 4.11 mg/dL — ABNORMAL HIGH (ref 0.44–1.00)
GFR, Estimated: 10 mL/min — ABNORMAL LOW (ref >60)
Glucose, Bld: 219 mg/dL — ABNORMAL HIGH (ref 70–99)
Potassium: 4.9 mmol/L (ref 3.5–5.1)
Sodium: 138 mmol/L (ref 135–145)
Total Bilirubin: 1 mg/dL (ref 0.3–1.2)
Total Protein: 7 g/dL (ref 6.5–8.1)

## 2020-12-18 LAB — URINALYSIS, COMPLETE (UACMP) WITH MICROSCOPIC
Bacteria, UA: NONE SEEN
Bilirubin Urine: NEGATIVE
Glucose, UA: 150 mg/dL — AB
Ketones, ur: NEGATIVE mg/dL
Leukocytes,Ua: NEGATIVE
Nitrite: NEGATIVE
Protein, ur: >=300 mg/dL — AB
Specific Gravity, Urine: 1.016 (ref 1.005–1.030)
pH: 5 (ref 5.0–8.0)

## 2020-12-18 LAB — RESP PANEL BY RT-PCR (FLU A&B, COVID) ARPGX2
Influenza A by PCR: NEGATIVE
Influenza B by PCR: NEGATIVE
SARS Coronavirus 2 by RT PCR: NEGATIVE

## 2020-12-18 LAB — CBC
HCT: 35 % — ABNORMAL LOW (ref 36.0–46.0)
Hemoglobin: 11 g/dL — ABNORMAL LOW (ref 12.0–15.0)
MCH: 27.2 pg (ref 26.0–34.0)
MCHC: 31.4 g/dL (ref 30.0–36.0)
MCV: 86.4 fL (ref 80.0–100.0)
Platelets: 331 K/uL (ref 150–400)
RBC: 4.05 MIL/uL (ref 3.87–5.11)
RDW: 17.5 % — ABNORMAL HIGH (ref 11.5–15.5)
WBC: 15.2 K/uL — ABNORMAL HIGH (ref 4.0–10.5)
nRBC: 0 % (ref 0.0–0.2)

## 2020-12-18 LAB — CBG MONITORING, ED: Glucose-Capillary: 244 mg/dL — ABNORMAL HIGH (ref 70–99)

## 2020-12-18 LAB — CK: Total CK: 804 U/L — ABNORMAL HIGH (ref 38–234)

## 2020-12-18 LAB — PROCALCITONIN: Procalcitonin: 0.84 ng/mL

## 2020-12-18 LAB — LACTIC ACID, PLASMA: Lactic Acid, Venous: 1.8 mmol/L (ref 0.5–1.9)

## 2020-12-18 MED ORDER — VANCOMYCIN HCL 1500 MG/300ML IV SOLN
1500.0000 mg | Freq: Once | INTRAVENOUS | Status: DC
  Filled 2020-12-18: qty 300

## 2020-12-18 MED ORDER — STERILE WATER FOR INJECTION IV SOLN
INTRAVENOUS | Status: DC
  Filled 2020-12-18 (×11): qty 1000
  Filled 2020-12-18: qty 150
  Filled 2020-12-18: qty 1000
  Filled 2020-12-18: qty 150

## 2020-12-18 MED ORDER — VANCOMYCIN HCL IN DEXTROSE 1-5 GM/200ML-% IV SOLN
1000.0000 mg | Freq: Once | INTRAVENOUS | Status: DC
  Filled 2020-12-18: qty 200

## 2020-12-18 MED ORDER — FENTANYL CITRATE PF 50 MCG/ML IJ SOSY
25.0000 ug | PREFILLED_SYRINGE | Freq: Once | INTRAMUSCULAR | Status: AC
  Administered 2020-12-18: 25 ug via INTRAVENOUS
  Filled 2020-12-18: qty 1

## 2020-12-18 MED ORDER — SODIUM CHLORIDE 0.9 % IV BOLUS (SEPSIS)
500.0000 mL | Freq: Once | INTRAVENOUS | Status: AC
  Administered 2020-12-18: 500 mL via INTRAVENOUS

## 2020-12-18 MED ORDER — VANCOMYCIN HCL 750 MG/150ML IV SOLN: 750.0000 mg | INTRAVENOUS | Status: DC

## 2020-12-18 MED ORDER — ACETAMINOPHEN 325 MG PO TABS
650.0000 mg | ORAL_TABLET | Freq: Four times a day (QID) | ORAL | Status: DC | PRN
  Administered 2020-12-19 – 2020-12-31 (×7): 650 mg via ORAL
  Filled 2020-12-18 (×7): qty 2

## 2020-12-18 MED ORDER — SODIUM CHLORIDE 0.9 % IV SOLN: 2.0000 g | INTRAVENOUS | Status: DC

## 2020-12-18 MED ORDER — METRONIDAZOLE 500 MG/100ML IV SOLN
500.0000 mg | Freq: Two times a day (BID) | INTRAVENOUS | Status: DC
  Administered 2020-12-19: 500 mg via INTRAVENOUS

## 2020-12-18 MED ORDER — VANCOMYCIN HCL IN DEXTROSE 1-5 GM/200ML-% IV SOLN: 1000.0000 mg | Freq: Once | INTRAVENOUS | Status: DC

## 2020-12-18 MED ORDER — ACETAMINOPHEN 650 MG RE SUPP
650.0000 mg | Freq: Four times a day (QID) | RECTAL | Status: DC | PRN
  Filled 2020-12-18: qty 1

## 2020-12-18 MED ORDER — SODIUM CHLORIDE 0.9 % IV SOLN
2.0000 g | Freq: Once | INTRAVENOUS | Status: AC
  Administered 2020-12-18: 2 g via INTRAVENOUS
  Filled 2020-12-18: qty 2

## 2020-12-18 MED ORDER — METRONIDAZOLE 500 MG/100ML IV SOLN
500.0000 mg | Freq: Once | INTRAVENOUS | Status: DC
  Filled 2020-12-18: qty 100

## 2020-12-18 MED ORDER — ONDANSETRON HCL 4 MG PO TABS: 4.0000 mg | ORAL_TABLET | Freq: Four times a day (QID) | ORAL | Status: DC | PRN

## 2020-12-18 MED ORDER — HYDRALAZINE HCL 20 MG/ML IJ SOLN
10.0000 mg | INTRAMUSCULAR | Status: DC | PRN
  Administered 2020-12-18 – 2020-12-31 (×7): 10 mg via INTRAVENOUS
  Filled 2020-12-18 (×7): qty 1

## 2020-12-18 MED ORDER — ONDANSETRON HCL 4 MG/2ML IJ SOLN: 4.0000 mg | Freq: Four times a day (QID) | INTRAMUSCULAR | Status: DC | PRN

## 2020-12-18 MED ORDER — DEXTROSE 5 % IV SOLN
10.0000 mg/kg | INTRAVENOUS | Status: DC
  Filled 2020-12-18: qty 16.5

## 2020-12-18 MED ORDER — INSULIN ASPART 100 UNIT/ML IJ SOLN
0.0000 [IU] | INTRAMUSCULAR | Status: DC
  Administered 2020-12-19: 5 [IU] via SUBCUTANEOUS
  Administered 2020-12-19: 3 [IU] via SUBCUTANEOUS
  Administered 2020-12-19: 2 [IU] via SUBCUTANEOUS
  Administered 2020-12-19: 3 [IU] via SUBCUTANEOUS
  Administered 2020-12-19: 5 [IU] via SUBCUTANEOUS
  Administered 2020-12-19: 3 [IU] via SUBCUTANEOUS
  Administered 2020-12-20 – 2020-12-21 (×5): 2 [IU] via SUBCUTANEOUS
  Administered 2020-12-22: 5 [IU] via SUBCUTANEOUS
  Administered 2020-12-22: 3 [IU] via SUBCUTANEOUS
  Administered 2020-12-22 (×2): 5 [IU] via SUBCUTANEOUS
  Administered 2020-12-22: 3 [IU] via SUBCUTANEOUS
  Administered 2020-12-23: 5 [IU] via SUBCUTANEOUS
  Administered 2020-12-23: 3 [IU] via SUBCUTANEOUS
  Administered 2020-12-23: 8 [IU] via SUBCUTANEOUS
  Administered 2020-12-23 (×2): 5 [IU] via SUBCUTANEOUS
  Administered 2020-12-23: 3 [IU] via SUBCUTANEOUS
  Administered 2020-12-24: 5 [IU] via SUBCUTANEOUS
  Administered 2020-12-24: 3 [IU] via SUBCUTANEOUS
  Administered 2020-12-24: 5 [IU] via SUBCUTANEOUS
  Administered 2020-12-24: 3 [IU] via SUBCUTANEOUS
  Filled 2020-12-18 (×26): qty 1

## 2020-12-18 MED ORDER — AMLODIPINE BESYLATE 10 MG PO TABS
10.0000 mg | ORAL_TABLET | Freq: Every day | ORAL | Status: DC
  Administered 2020-12-19 – 2021-01-01 (×15): 10 mg via ORAL
  Filled 2020-12-18: qty 2
  Filled 2020-12-18 (×11): qty 1
  Filled 2020-12-18: qty 2
  Filled 2020-12-18 (×2): qty 1

## 2020-12-18 MED ORDER — LACTATED RINGERS IV SOLN: INTRAVENOUS | Status: DC

## 2020-12-18 MED ORDER — ONDANSETRON HCL 4 MG/2ML IJ SOLN
4.0000 mg | Freq: Once | INTRAMUSCULAR | Status: AC
  Administered 2020-12-18: 4 mg via INTRAVENOUS
  Filled 2020-12-18: qty 2

## 2020-12-18 MED ORDER — ACETAMINOPHEN 325 MG PO TABS
650.0000 mg | ORAL_TABLET | Freq: Once | ORAL | Status: AC
  Administered 2020-12-18: 650 mg via ORAL
  Filled 2020-12-18: qty 2

## 2020-12-18 NOTE — ED Notes (Signed)
Jennifer Carrillo husband

## 2020-12-18 NOTE — ED Triage Notes (Signed)
Pt in via EMS from home with c/o leg pain to noth legs from her knees down. Pt reports pain for several weeks worsening last pm, HR 105, 99.4 temp, CBG 210.

## 2020-12-18 NOTE — ED Triage Notes (Signed)
Pt reports yesterday started with pain to both of her legs. Pt states pain is from her knees down to her feet. Pt denies falls or other injuries, states they just ache.

## 2020-12-18 NOTE — Progress Notes (Signed)
Pharmacy Antibiotic Note  Jennifer Carrillo is a 87 y.o. female admitted on 12/18/2020 with sepsis.  Pharmacy has been consulted for Vanc, Cefepime and Acyclovir dosing.  Pt is obese and has stage IV ESRD but does not appear to be on HD.  Plan: Cefepime 2 gm IV X 1 given in ED on 12/11 @ 2202. Cefepime 2 gm IV Q24H ordered to continue on 12/12 @ 2200.   Acyclovir  825 mg (10 mg/kg Adj BW) IV Q24H ordered to start on 12/11 @ ~ 2330.  Vancomycin 2500 mg IV X 1 ordered for 12/12 @ ~ 0000. Vancomycin 750 mg IV Q48H ordered to start on 12/14 @ 0000.  AUC = 530 Vanc trough = 16.3 mcg/mL    Height: 5\' 7"  (170.2 cm) Weight: 113.4 kg (250 lb) IBW/kg (Calculated) : 61.6  Temp (24hrs), Avg:100.7 F (38.2 C), Min:99.5 F (37.5 C), Max:101.8 F (38.8 C)  Recent Labs  Lab 12/18/20 0834 12/18/20 2115  WBC 15.2*  --   CREATININE 4.11*  --   LATICACIDVEN  --  1.8    Estimated Creatinine Clearance: 13 mL/min (A) (by C-G formula based on SCr of 4.11 mg/dL (H)).    Allergies  Allergen Reactions   Benazepril Nausea Only    Antimicrobials this admission:   >>   >>   Dose adjustments this admission:   Microbiology results:  BCx:   UCx:    Sputum:    MRSA PCR:   Thank you for allowing pharmacy to be a part of this patient's care.  Michall Noffke D 12/18/2020 11:19 PM

## 2020-12-18 NOTE — ED Notes (Signed)
Hydralazine 100mg  twice day recently increased to this

## 2020-12-18 NOTE — Progress Notes (Signed)
Pt being followed by [facility] for Sepsis protocol. 

## 2020-12-18 NOTE — Consult Note (Signed)
CODE SEPSIS - PHARMACY COMMUNICATION  **Broad Spectrum Antibiotics should be administered within 1 hour of Sepsis diagnosis**  Time Code Sepsis Called/Page Received: 2117  Antibiotics Ordered: 2117  Time of 1st antibiotic administration: 2202  Additional action taken by pharmacy: N/A  If necessary, Name of Provider/Nurse Contacted: N/A    Darnelle Bos ,PharmD Clinical Pharmacist  12/18/2020  9:19 PM

## 2020-12-18 NOTE — ED Notes (Signed)
Provider made aware of pt BP and pt BP home med

## 2020-12-18 NOTE — Consult Note (Signed)
PHARMACY -  BRIEF ANTIBIOTIC NOTE   Pharmacy has received consult(s) for cefepime and vancomycin from an ED provider.  The patient's profile has been reviewed for ht/wt/allergies/indication/available labs.    One time order(s) placed for cefepime 2 g and vancomycin 2500 mg IV (ordered as 1000 mg followed by 1500 mg)   Further antibiotics/pharmacy consults should be ordered by admitting physician if indicated.                       Thank you, Darnelle Bos, PharmD 12/18/2020  9:19 PM

## 2020-12-18 NOTE — H&P (Signed)
History and Physical    Jennifer Carrillo:[id number] DOB: [birth year] DOA: 12/18/2020  PCP: [name], MD   Patient coming from: home  I have personally briefly reviewed patient's relevant medical records in [place]  Chief Complaint: weakness and back and leg pain  HPI: Jennifer Carrillo is a 87 y.o. female with medical history significant for DM, HTN, CKD 4,CAD   diagnosed on 11/28 with shingles T1 dermatome treated with Valtrex and gabapentin, who presents to the ED with a 1 day history of diffuse pain extending from her low back down to bilateral lower legs of severe intensity to where she was just laying in bed unable to get out of bed to go to the bathroom.  History is limited due to confusion.  Most of the history is taken from the daughter st bedside.  Patient had no prior cough, chest pain or shortness of breath.  Denies prior nausea, vomiting, abdominal pain or diarrhea.  Denies headache, visual disturbance.  ED course: On arrival BP 202/80 - 233/89, pulse 58.  T-max 101.8 Pulse up to 110.  O2 sat 100% on room air Blood work: WBC 15,000.  Lactic acid pending. Hb 11. Creatinine 4.11, up from baseline of 2.9 with bicarb of 18 and normal anion gap.  Blood glucose 219 Total CK8 104 Urinalysis unremarkable  EKG, pending  Imaging: Chest x-ray nonacute MRI lumbar spine with no evidence of spinal infection or fracture  Patient started on sepsis fluids and broad-spectrum antibiotics for sepsis of unknown source.  Hospitalist consulted for admission.    Review of Systems: Unable to obtain due to confusion  Assessment/Plan    Sepsis, severe ([place]) -Patient with fever, tachycardia, leukocytosis, AKI.  Lactic acid pending, possibly related to sepsis but could be related to dehydration given history - No obvious source of sepsis at this time   - Continue sepsis fluids - We will continue broad-spectrum antibiotics for sepsis of unknown source pending procalcitonin and  will de-escalate if less than 0.1 - We will add acyclovir for possible meningitis given development of altered mental status during work-up in the ED though low suspicion for the same    Acute kidney injury superimposed on CKD IV (HCC)   Metabolic acidosis - Suspect prerenal and secondary to dehydration from poor oral intake as well as rhabdo - Uncertain relation to gabapentin and Valtrex started on 11/28 for shingles - IV hydration with bicarb - Monitor renal function and avoid nephrotoxins - Consider nephrology consult if not improving  Acute metabolic encephalopathy Underlying cognitive dysfunction - Secondary to sepsis, hypertensive emergency - Treat acute conditions - Neurologic checks, - Fall and aspiration precautions, n.p.o. from midnight - Given no identified source for sepsis we will get IR for LP in the a.m.  Mild cognitive dysfunction -Delirium precautions    Rhabdomyolysis, nontraumatic - IV hydration as above - Monitor renal function - Hold statins    Hypertensive emergency without congestive heart failure - BP high of 233/89 - Likely related to not being able to take meds -Continue home amlodipine with IV hydralazine with as needed to achieve control  Hyperglycemia due to type 2 diabetes mellitus (HCC) - Sliding scale insulin coverage    Shingles 12/05/2020 - s/p gabapentin and Valtrex    PAD (peripheral artery disease) (HCC) -Continue Ranexa, Plavix.  Holding statins    Coronary artery disease involving native coronary artery of native heart - Continue  Ranexa Plavix.  Holding statins.  Continue isosorbide  Anemia of chronic kidney failure, stage 4 (severe) (HCC) - At baseline  History of breast cancer - Continue Arimidex   DVT prophylaxis: SCDs, given LP in a.m. Code Status: full code  Family Communication: Daughter, [name] at bedside Disposition Plan: Back to previous home environment Consults called: none  Status:At the time of  admission, it appears that the appropriate admission status for this patient is INPATIENT. This is judged to be reasonable and necessary in order to provide the required intensity of service to ensure the patient's safety given the presenting symptoms, physical exam findings, and initial radiographic and laboratory data in the context of their  Comorbid conditions.   Patient requires inpatient status due to high intensity of service, high risk for further deterioration and high frequency of surveillance required.   I certify that at the point of admission it is my clinical judgment that the patient will require inpatient hospital care spanning beyond 2 midnights     Physical Exam: Vitals:   12/18/20 0755 12/18/20 1716 12/18/20 1831 12/18/20 2134  BP: (!) 199/63 (!) 233/89 (!) 228/61 (!) 222/72  Pulse: (!) 103 (!) 107 (!) 102 (!) 110  Resp: (!) _0 Temp: 99.5 F (37.5 C)   (!) 101.8 F (38.8 C)  TempSrc: Oral   Rectal  SpO2: 100% 98% 97% 100%  Weight: 113.4 kg     Height: _1  (1.702 m)      Constitutional: Alert, oriented x 2 . Not in any apparent distress, mildly ill appearing HEENT:      Head: Normocephalic and atraumatic.         Eyes: PERLA, EOMI, Conjunctivae are normal. Sclera is non-icteric.       Mouth/Throat: Mucous membranes are moist.       Neck: Supple with no signs of meningismus. Cardiovascular: Regular rate and rhythm. No murmurs, gallops, or rubs. 2+ symmetrical distal pulses are present . No JVD. No  LE edema Respiratory: Respiratory effort normal .Lungs sounds clear bilaterally. No wheezes, crackles, or rhonchi.  Gastrointestinal: Soft, non tender, non distended. Positive bowel sounds.  Genitourinary: No CVA tenderness. Musculoskeletal: Nontender with normal range of motion in all extremities. No cyanosis, or erythema of extremities. Neurologic:  Face is symmetric. Moving all extremities. No gross focal neurologic deficits . Skin: Skin is warm, dry.  No  rash or ulcers Psychiatric: Mood and affect are appropriate     Past Medical History:  Diagnosis Date   Arthritis    Asthma    Calf pain    Cancer ([place]) 2016   left breast   COPD (chronic obstructive pulmonary disease) (HCC)    Diabetes (HCC)    Discoid lupus    Gastro-esophageal reflux    Glaucoma    Hyperlipemia    Hypertension    Iron deficiency anemia    Joint pain    Leg swelling    Osteoarthritis    PVD (peripheral vascular disease) ([place])    Sinus problem    Sleep apnea    No CPAP/ Can't tolerate   Stroke ([place]) 1987    Past Surgical History:  Procedure Laterality Date   BREAST SURGERY Left    left lumpectomy   CAROTID ANGIOGRAPHY Left 06/25/2018   Procedure: CAROTID ANGIOGRAPHY, possible intervention;  Surgeon: Katha Cabal, MD;  Location: [facility];  Service: Cardiovascular;  Laterality: Left;   CAROTID ENDARTERECTOMY Right    CAROTID PTA/STENT INTERVENTION Left 06/25/2018   Procedure: CAROTID PTA/STENT INTERVENTION;  Surgeon: Katha Cabal, MD;  Location: [facility];  Service: Cardiovascular;  Laterality: Left;   CATARACT EXTRACTION Right 2013   CORONARY STENT PLACEMENT     L. L. E.   EYE SURGERY Bilateral    cataract   LEFT HEART CATH AND CORONARY ANGIOGRAPHY Left 05/06/2018   Procedure: LEFT HEART CATH AND CORONARY ANGIOGRAPHY;  Surgeon: Corey Skains, MD;  Location: [facility];  Service: Cardiovascular;  Laterality: Left;   LOWER EXTREMITY ANGIOGRAPHY Right 01/22/2018   Procedure: LOWER EXTREMITY ANGIOGRAPHY;  Surgeon: Katha Cabal, MD;  Location: [facility];  Service: Cardiovascular;  Laterality: Right;   RENAL ANGIOGRAPHY Left 02/26/2018   Procedure: RENAL ANGIOGRAPHY;  Surgeon: Katha Cabal, MD;  Location: [facility];  Service: Cardiovascular;  Laterality: Left;   STENT PLACEMENT VASCULAR ([facility]) Left    stent placement on LLE   TUBAL LIGATION       reports that she quit  smoking about 35 years ago. Her smoking use included cigarettes. She has never used smokeless tobacco. She reports that she does not drink alcohol and does not use drugs.  Allergies  Allergen Reactions   Benazepril Nausea Only    Family History  Problem Relation Age of Onset   Heart disease Mother    Diabetes Other    Hypertension Other    Diabetes Sister    Lung cancer Brother    Leukemia Daughter    Bladder Cancer Neg Hx    Kidney disease Neg Hx    Prostate cancer Neg Hx       Prior to Admission medications   Medication Sig Start Date End Date Taking? Authorizing Provider  ACCU-CHEK GUIDE test strip TEST BLOOD SUGAR THREE TIMES DAILY  AS  DIRECTED 04/27/20   [name], MD  Accu-Chek Softclix Lancets lancets USE AS INSTRUCTED 3 TIMES A DAY 09/22/19   [name], MD  acetaminophen (TYLENOL) 500 MG tablet Take 1,000 mg by mouth every 6 (six) hours as needed for moderate pain or headache.    [provider]  Alcohol Swabs (B-D SINGLE USE SWABS REGULAR) PADS Use as directed for 3 times daily DX E11.65 06/22/19   Kendell Bane, NP  amLODipine (NORVASC) 10 MG tablet Take 1 tablet (10 mg total) by mouth daily at breakfast 10/19/20   [name], MD  anastrozole (ARIMIDEX) 1 MG tablet Take 1 tablet (1 mg total) by mouth daily; afternoon (dinner) 10/19/20   [name], MD  atorvastatin (LIPITOR) 40 MG tablet Take 1 tablet by mouth daily. 11/02/19   [provider]  Blood Glucose Monitoring Suppl (ACCU-CHEK GUIDE ME) w/Device KIT Use as instructed. DX e11.65 06/18/19   Kendell Bane, NP  cholecalciferol (VITAMIN D) 25 MCG (1000 UNIT) tablet TAKE 1 TABLET EVERY MONDAY, WEDNESDAY, AND FRIDAY. 11/04/19   Luiz Ochoa, NP  clopidogrel (PLAVIX) 75 MG tablet TAKE 1 TABLET EVERY DAY AFTERNOON (DINNER) 10/05/20   McDonough, Si Gaul, PA-C  digoxin (LANOXIN) 0.125 MG tablet Take 1 tablet (125 mcg total) by mouth daily at breakfast. 10/05/20   McDonough, Si Gaul, PA-C   docusate sodium (COLACE) 100 MG capsule Take 200 mg by mouth at bedtime as needed for mild constipation.    [provider]  donepezil (ARICEPT) 10 MG tablet TAKE 1 TABLET BY MOUTH AT BEDTIME FOR MEMORY (NEED MD APPOINTMENT) 10/03/20   McDonough, Si Gaul, PA-C  fenofibrate 54 MG tablet  Take 54 mg by mouth daily. 07/29/20   [provider]  ferrous sulfate 325 (65 FE) MG EC tablet Take 325 mg by mouth 4 (four) times a week.    [provider]  fluticasone-salmeterol (ADVAIR) 100-50 MCG/ACT AEPB Inhale 1 puff into the lungs 2 (two) times daily. 07/19/20   [name], MD  gabapentin (NEURONTIN) 100 MG capsule Take 1 capsule (100 mg total) by mouth 3 (three) times daily. 12/05/20   McDonough, Si Gaul, PA-C  hydrALAZINE (APRESOLINE) 25 MG tablet TAKE 1 TABLET TWICE DAILY (NEED APPOINTMENT) 10/03/20   McDonough, Si Gaul, PA-C  hydrochlorothiazide (HYDRODIURIL) 12.5 MG tablet Take 1 tablet by mouth daily. 04/03/18   [provider]  insulin NPH-regular Human (70-30) 100 UNIT/ML injection Inject 30 Units into the skin 2 (two) times daily.    [provider]  isosorbide mononitrate (IMDUR) 30 MG 24 hr tablet Take 1 tablet (30 mg total) by mouth daily at breakfast 08/16/20   [name], MD  lidocaine (LIDODERM) 5 % Place 1 patch onto the skin every 12 (twelve) hours. Remove & Discard patch within 12 hours or as directed by MD 11/26/20 11/26/21  Merlyn Lot, MD  ranolazine (RANEXA) 500 MG 12 hr tablet Take 500 mg by mouth 2 (two) times daily; breakfast, Afternoon (dinner) 08/16/20   [name], MD  sodium bicarbonate 650 MG tablet Take 650 mg by mouth 2 (two) times daily. 09/02/20   [provider]  sodium zirconium cyclosilicate (LOKELMA) 10 g PACK packet Take by mouth. 09/07/20   [provider]  telmisartan (MICARDIS) 80 MG tablet Take 1 tablet (80 mg total) by mouth daily. Afternoon (lunch) 08/16/20   [name], MD  vitamin C (ASCORBIC  ACID) 500 MG tablet Take by mouth daily.    [provider]  Vitamin D, Cholecalciferol, 25 MCG (1000 UT) CAPS Take 1,000 Units by mouth every Monday, Wednesday, and Friday. 09/09/19   Luiz Ochoa, NP      Labs on Admission: I have personally reviewed following labs and imaging studies  CBC: Recent Labs  Lab 12/18/20 0834  WBC 15.2*  HGB 11.0*  HCT 35.0*  MCV 86.4  PLT 384   Basic Metabolic Panel: Recent Labs  Lab 12/18/20 0834  NA 138  K 4.9  CL 110  CO2 18*  GLUCOSE 219*  BUN 46*  CREATININE 4.11*  CALCIUM 9.0   GFR: Estimated Creatinine Clearance: 13 mL/min (A) (by C-G formula based on SCr of 4.11 mg/dL (H)). Liver Function Tests: Recent Labs  Lab 12/18/20 0834  AST 28  ALT 15  ALKPHOS 91  BILITOT 1.0  PROT 7.0  ALBUMIN 3.3*   No results for input(s): LIPASE, AMYLASE in the last 168 hours. No results for input(s): AMMONIA in the last 168 hours. Coagulation Profile: No results for input(s): INR, PROTIME in the last 168 hours. Cardiac Enzymes: Recent Labs  Lab 12/18/20 0834  CKTOTAL 804*   BNP (last 3 results) No results for input(s): PROBNP in the last 8760 hours. HbA1C: No results for input(s): HGBA1C in the last 72 hours. CBG: Recent Labs  Lab 12/18/20 2121  GLUCAP 244*   Lipid Profile: No results for input(s): CHOL, HDL, LDLCALC, TRIG, CHOLHDL, LDLDIRECT in the last 72 hours. Thyroid Function Tests: No results for input(s): TSH, T4TOTAL, FREET4, T3FREE, THYROIDAB in the last 72 hours. Anemia Panel: No results for input(s): VITAMINB12, FOLATE, FERRITIN, TIBC, IRON, RETICCTPCT in the last 72 hours.  Urine analysis:    Component Value Date/Time   COLORURINE YELLOW (A) 12/18/2020 1730   APPEARANCEUR HAZY (A) 12/18/2020 1730   APPEARANCEUR Clear 03/24/2020 0954   LABSPEC 1.016 12/18/2020 1730   PHURINE 5.0 12/18/2020 1730   GLUCOSEU 150 (A) 12/18/2020 1730   HGBUR MODERATE (A) 12/18/2020 1730   BILIRUBINUR NEGATIVE 12/18/2020  1730   BILIRUBINUR Negative 03/24/2020 [place] 12/18/2020 1730   PROTEINUR >=300 (A) 12/18/2020 1730   UROBILINOGEN 0.2 12/17/2017 0931   NITRITE NEGATIVE 12/18/2020 1730   LEUKOCYTESUR NEGATIVE 12/18/2020 1730    Radiological Exams on Admission: DG Chest 1 View  Result Date: 12/18/2020 CLINICAL DATA:  Weakness, lower extremity pain EXAM: CHEST  1 VIEW COMPARISON:  11/26/2020 FINDINGS: Single frontal view of the chest demonstrates a stable cardiac silhouette. Stable atherosclerosis. No acute airspace disease, effusion, or pneumothorax. No acute bony abnormality. IMPRESSION: 1. No acute intrathoracic process. Electronically Signed   By: Randa Ngo M.D.   On: 12/18/2020 15:37   DG Lumbar Spine 2-3 Views  Result Date: 12/18/2020 CLINICAL DATA:  Weakness, pain EXAM: LUMBAR SPINE - 2-3 VIEW COMPARISON:  None. FINDINGS: Degenerative spurring throughout the lumbar spine. Mild disc space narrowing at L5-S1. Degenerative facet disease. Normal alignment. No fracture. Aortic atherosclerosis. Left renal artery and iliac artery stents bilaterally. IMPRESSION: Degenerative changes.  No acute bony abnormality. Electronically Signed   By: Rolm Baptise M.D.   On: 12/18/2020 15:38   DG Pelvis 1-2 Views  Result Date: 12/18/2020 CLINICAL DATA:  Pain, weakness EXAM: PELVIS - 1-2 VIEW COMPARISON:  None. FINDINGS: Early spurring in the hip joints bilaterally. No acute bony abnormality. Specifically, no fracture, subluxation, or dislocation. IMPRESSION: No acute bony abnormality. Electronically Signed   By: Rolm Baptise M.D.   On: 12/18/2020 15:37   MR LUMBAR SPINE WO CONTRAST  Result Date: 12/18/2020 CLINICAL DATA:  Lumbar radiculopathy. Rule out infection. Low back pain EXAM: MRI LUMBAR SPINE WITHOUT CONTRAST TECHNIQUE: Multiplanar, multisequence MR imaging of the lumbar spine was performed. No intravenous contrast was administered. COMPARISON:  Lumbar spine radiographs 12/18/2020  FINDINGS: Segmentation:  5 lumbar segments as noted on x-ray. Alignment: Slight anterolisthesis L4-5. Otherwise normal alignment. Vertebrae: Negative for fracture or mass. No evidence of spinal infection. Conus medullaris and cauda equina: Conus extends to the L1-2 level. Conus and cauda equina appear normal. Paraspinal and other soft tissues: Negative for paraspinous mass or adenopathy. Disc levels: L1-2: Negative L2-3: Mild disc bulging.  Negative for stenosis L3-4: Diffuse bulging of the disc. Small right foraminal disc protrusion. Bilateral facet degeneration. Mild subarticular stenosis bilaterally. Spinal canal adequate in size L4-5: Mild anterolisthesis. Diffuse disc bulging and bilateral facet degeneration. Mild spinal stenosis. Neural foramina patent bilaterally. L5-S1: Disc degeneration with diffuse endplate spurring. Moderate subarticular stenosis bilaterally. IMPRESSION: 1. Mild lumbar degenerative change.  Mild spinal stenosis as above 2. No evidence of spinal infection or fracture. Electronically Signed   By: Franchot Gallo M.D.   On: 12/18/2020 20:06       Athena Masse MD [facility]   12/18/2020, 10:00 PM

## 2020-12-18 NOTE — ED Provider Notes (Signed)
[facility] Emergency Department Provider Note  ____________________________________________   Event Date/Time   First MD Initiated Contact with Patient 12/18/20 1431     (approximate)  I have reviewed the triage vital signs and the nursing notes.   HISTORY  Chief Complaint Knee Pain and Leg Pain    HPI Jennifer Carrillo is a 87 y.o. female who tells me that last Saturday when she was getting up out of bed her low back began to hurt and her upper legs began to hurt.  This was so severe that she really could not walk.  She has been laying in bed unable to get to the bathroom.  She says has been going on since Saturday and last Sunday.  Today is Sunday and I cannot be certain after speaking with her for some time whether or not this was yesterday or last week.  I will try again.  Patient denies any falling or any other problems.  The legs just hurt and are much worse if I try to do a straight leg raise.  Patient does not report any numbness or incontinence although she says she has been unable to get to the bathroom and has been urinating in the bed.       Past Medical History:  Diagnosis Date   Arthritis    Asthma    Calf pain    Cancer ([place]) 2016   left breast   COPD (chronic obstructive pulmonary disease) (HCC)    Diabetes (HCC)    Discoid lupus    Gastro-esophageal reflux    Glaucoma    Hyperlipemia    Hypertension    Iron deficiency anemia    Joint pain    Leg swelling    Osteoarthritis    PVD (peripheral vascular disease) ([place])    Sinus problem    Sleep apnea    No CPAP/ Can't tolerate   Stroke [facility]) 1987    Patient Active Problem List   Diagnosis Date Noted   Anemia of chronic kidney failure, stage 4 (severe) ([place]) 08/31/2020   Carotid stenosis, symptomatic w/o infarct 06/25/2018   Hypertension 06/18/2018   Carbuncle and furuncle of buttock 06/18/2018   Coronary artery disease involving native coronary artery of native heart  05/19/2018   Old inferior wall myocardial infarction 05/19/2018   Stable angina ([place]) 03/31/2018   Abnormal ECG 03/07/2018   Benign essential HTN 03/07/2018   Bilateral carotid artery stenosis 03/07/2018   SOBOE (shortness of breath on exertion) 03/07/2018   Atherosclerotic peripheral vascular disease with intermittent claudication ([place]) 03/07/2018   Carotid stenosis 02/26/2018   Renal artery stenosis (HCC) 02/26/2018   Anemia 02/05/2018   Leg pain 01/13/2018   PAD (peripheral artery disease) ([place]) 01/13/2018   Diabetes mellitus without complication ([place]) 68/12/7515   Renovascular hypertension 04/09/2017   Primary osteoarthritis of both knees 03/12/2017   Malignant neoplasm of breast in female, estrogen receptor positive ([place]) 12/16/2015   Iron deficiency anemia 12/08/2015    Past Surgical History:  Procedure Laterality Date   BREAST SURGERY Left    left lumpectomy   CAROTID ANGIOGRAPHY Left 06/25/2018   Procedure: CAROTID ANGIOGRAPHY, possible intervention;  Surgeon: Katha Cabal, MD;  Location: [place] [facility];  Service: Cardiovascular;  Laterality: Left;   CAROTID ENDARTERECTOMY Right    CAROTID PTA/STENT INTERVENTION Left 06/25/2018   Procedure: CAROTID PTA/STENT INTERVENTION;  Surgeon: Katha Cabal, MD;  Location: [place] [facility];  Service: Cardiovascular;  Laterality: Left;  CATARACT EXTRACTION Right 2013   CORONARY STENT PLACEMENT     L. L. E.   EYE SURGERY Bilateral    cataract   LEFT HEART CATH AND CORONARY ANGIOGRAPHY Left 05/06/2018   Procedure: LEFT HEART CATH AND CORONARY ANGIOGRAPHY;  Surgeon: Corey Skains, MD;  Location: [place] [facility];  Service: Cardiovascular;  Laterality: Left;   LOWER EXTREMITY ANGIOGRAPHY Right 01/22/2018   Procedure: LOWER EXTREMITY ANGIOGRAPHY;  Surgeon: Katha Cabal, MD;  Location: [place] [facility];  Service: Cardiovascular;  Laterality: Right;   RENAL ANGIOGRAPHY Left 02/26/2018   Procedure: RENAL  ANGIOGRAPHY;  Surgeon: Katha Cabal, MD;  Location: [place] [facility];  Service: Cardiovascular;  Laterality: Left;   STENT PLACEMENT VASCULAR ([facility] HX) Left    stent placement on LLE   TUBAL LIGATION      Prior to Admission medications   Medication Sig Start Date End Date Taking? Authorizing Provider  ACCU-CHEK GUIDE test strip TEST BLOOD SUGAR THREE TIMES DAILY  AS  DIRECTED 04/27/20   [name], MD  Accu-Chek Softclix Lancets lancets USE AS INSTRUCTED 3 TIMES A DAY 09/22/19   [name], MD  acetaminophen (TYLENOL) 500 MG tablet Take 1,000 mg by mouth every 6 (six) hours as needed for moderate pain or headache.    [provider]  Alcohol Swabs (B-D SINGLE USE SWABS REGULAR) PADS Use as directed for 3 times daily DX E11.65 06/22/19   Kendell Bane, NP  amLODipine (NORVASC) 10 MG tablet Take 1 tablet (10 mg total) by mouth daily at breakfast 10/19/20   [name], MD  anastrozole (ARIMIDEX) 1 MG tablet Take 1 tablet (1 mg total) by mouth daily; afternoon (dinner) 10/19/20   [name], MD  atorvastatin (LIPITOR) 40 MG tablet Take 1 tablet by mouth daily. 11/02/19   [provider]  Blood Glucose Monitoring Suppl (ACCU-CHEK GUIDE ME) w/Device KIT Use as instructed. DX e11.65 06/18/19   Kendell Bane, NP  cholecalciferol (VITAMIN D) 25 MCG (1000 UNIT) tablet TAKE 1 TABLET EVERY MONDAY, WEDNESDAY, AND FRIDAY. 11/04/19   Luiz Ochoa, NP  clopidogrel (PLAVIX) 75 MG tablet TAKE 1 TABLET EVERY DAY AFTERNOON (DINNER) 10/05/20   McDonough, Si Gaul, PA-C  digoxin (LANOXIN) 0.125 MG tablet Take 1 tablet (125 mcg total) by mouth daily at breakfast. 10/05/20   McDonough, Si Gaul, PA-C  docusate sodium (COLACE) 100 MG capsule Take 200 mg by mouth at bedtime as needed for mild constipation.    [provider]  donepezil (ARICEPT) 10 MG tablet TAKE 1 TABLET BY MOUTH AT BEDTIME FOR MEMORY (NEED MD APPOINTMENT) 10/03/20   McDonough, Si Gaul, PA-C   fenofibrate 54 MG tablet Take 54 mg by mouth daily. 07/29/20   [provider]  ferrous sulfate 325 (65 FE) MG EC tablet Take 325 mg by mouth 4 (four) times a week.    [provider]  fluticasone-salmeterol (ADVAIR) 100-50 MCG/ACT AEPB Inhale 1 puff into the lungs 2 (two) times daily. 07/19/20   [name], MD  gabapentin (NEURONTIN) 100 MG capsule Take 1 capsule (100 mg total) by mouth 3 (three) times daily. 12/05/20   McDonough, Si Gaul, PA-C  hydrALAZINE (APRESOLINE) 25 MG tablet TAKE 1 TABLET TWICE DAILY (NEED APPOINTMENT) 10/03/20   McDonough, Si Gaul, PA-C  hydrochlorothiazide (HYDRODIURIL) 12.5 MG tablet Take 1 tablet by mouth daily. 04/03/18   [provider]  insulin NPH-regular Human (70-30) 100 UNIT/ML injection Inject  30 Units into the skin 2 (two) times daily.    [provider]  isosorbide mononitrate (IMDUR) 30 MG 24 hr tablet Take 1 tablet (30 mg total) by mouth daily at breakfast 08/16/20   [name], MD  lidocaine (LIDODERM) 5 % Place 1 patch onto the skin every 12 (twelve) hours. Remove & Discard patch within 12 hours or as directed by MD 11/26/20 11/26/21  Merlyn Lot, MD  ranolazine (RANEXA) 500 MG 12 hr tablet Take 500 mg by mouth 2 (two) times daily; breakfast, Afternoon (dinner) 08/16/20   [name], MD  sodium bicarbonate 650 MG tablet Take 650 mg by mouth 2 (two) times daily. 09/02/20   [provider]  sodium zirconium cyclosilicate (LOKELMA) 10 g PACK packet Take by mouth. 09/07/20   [provider]  telmisartan (MICARDIS) 80 MG tablet Take 1 tablet (80 mg total) by mouth daily. Afternoon (lunch) 08/16/20   [name], MD  vitamin C (ASCORBIC ACID) 500 MG tablet Take by mouth daily.    [provider]  Vitamin D, Cholecalciferol, 25 MCG (1000 UT) CAPS Take 1,000 Units by mouth every Monday, Wednesday, and Friday. 09/09/19   Luiz Ochoa, NP    Allergies Benazepril  Family History  Problem  Relation Age of Onset   Heart disease Mother    Diabetes Other    Hypertension Other    Diabetes Sister    Lung cancer Brother    Leukemia Daughter    Bladder Cancer Neg Hx    Kidney disease Neg Hx    Prostate cancer Neg Hx     Social History Social History   Tobacco Use   Smoking status: Former    Types: Cigarettes    Quit date: 1987    Years since quitting: 35.9   Smokeless tobacco: Never   Tobacco comments:    quit 31 years  Vaping Use   Vaping Use: Never used  Substance Use Topics   Alcohol use: No   Drug use: No    Review of Systems  Constitutional: No fever/chills Eyes: No visual changes. ENT: No sore throat. Cardiovascular: Denies chest pain. Respiratory: Denies shortness of breath. Gastrointestinal: No abdominal pain.  No nausea, no vomiting.  No diarrhea.  No constipation. Genitourinary: Negative for dysuria. Musculoskeletal: back pain. Skin: Negative for rash. Neurological: Negative for headaches, focal weakness or numbness patient does have pain as described in HPI..   ____________________________________________   PHYSICAL EXAM:  VITAL SIGNS: ED Triage Vitals  Enc Vitals Group     BP 12/18/20 0755 (!) 199/63     Pulse Rate 12/18/20 0755 (!) 103     Resp 12/18/20 0755 (!) 22     Temp 12/18/20 0755 99.5 F (37.5 C)     Temp Source 12/18/20 0755 Oral     SpO2 12/18/20 0755 100 %     Weight 12/18/20 0751 180 lb 12.4 oz (82 kg)     Height 12/18/20 0751 _0  (1.702 m)     Head Circumference --      Peak Flow --      Pain Score 12/18/20 0751 7     Pain Loc --      Pain Edu? --      Excl. in [place]? --     Constitutional: Alert and oriented. Well appearing and in no acute distress.  This is while she is lying still.  When she is moving she starts to cry out in pain.  Eyes: Conjunctivae are normal.  Head: Atraumatic. Nose: No congestion/rhinnorhea. Mouth/Throat: Mucous membranes are moist.  Oropharynx non-erythematous. Neck: No stridor.   Cardiovascular: Normal rate, regular rhythm. Grossly normal heart sounds.  Good peripheral circulation. Respiratory: Normal respiratory effort.  No retractions. Lungs CTAB. Gastrointestinal: Soft and nontender. No distention. No abdominal bruits.  Musculoskeletal: Patient reports some tenderness on palpation of her upper legs bilaterally.  The pain is much worse with attempted straight leg raise.  Patient also has some pain in her low back on palpation.  This is diffuse.  It is worse just above the sacrum. Neurologic:  Normal speech and language.  Patient does not report any weakness or numbness just the pain.  On examination strength in the legs seems to be limited by pain. Skin:  Skin is warm, dry and intact. No rash noted. Psychiatric: Mood and affect are normal. Speech and behavior are normal.  ____________________________________________   LABS (all labs ordered are listed, but only abnormal results are displayed)  Labs Reviewed  CBC - Abnormal; Notable for the following components:      Result Value   WBC 15.2 (*)    Hemoglobin 11.0 (*)    HCT 35.0 (*)    RDW 17.5 (*)    All other components within normal limits  COMPREHENSIVE METABOLIC PANEL - Abnormal; Notable for the following components:   CO2 18 (*)    Glucose, Bld 219 (*)    BUN 46 (*)    Creatinine, Ser 4.11 (*)    Albumin 3.3 (*)    GFR, Estimated 10 (*)    All other components within normal limits  RESP PANEL BY RT-PCR (FLU A&B, COVID) ARPGX2  CK  URINALYSIS, COMPLETE (UACMP) WITH MICROSCOPIC   ____________________________________________  EKG   ____________________________________________  RADIOLOGY Gertha Calkin, personally viewed and evaluated these images (plain radiographs) as part of my medical decision making, as well as reviewing the written report by the radiologist.  ED MD interpretation:    Official radiology report(s): DG Chest 1 View  Result Date: 12/18/2020 CLINICAL DATA:   Weakness, lower extremity pain EXAM: CHEST  1 VIEW COMPARISON:  11/26/2020 FINDINGS: Single frontal view of the chest demonstrates a stable cardiac silhouette. Stable atherosclerosis. No acute airspace disease, effusion, or pneumothorax. No acute bony abnormality. IMPRESSION: 1. No acute intrathoracic process. Electronically Signed   By: Randa Ngo M.D.   On: 12/18/2020 15:37   DG Lumbar Spine 2-3 Views  Result Date: 12/18/2020 CLINICAL DATA:  Weakness, pain EXAM: LUMBAR SPINE - 2-3 VIEW COMPARISON:  None. FINDINGS: Degenerative spurring throughout the lumbar spine. Mild disc space narrowing at L5-S1. Degenerative facet disease. Normal alignment. No fracture. Aortic atherosclerosis. Left renal artery and iliac artery stents bilaterally. IMPRESSION: Degenerative changes.  No acute bony abnormality. Electronically Signed   By: Rolm Baptise M.D.   On: 12/18/2020 15:38   DG Pelvis 1-2 Views  Result Date: 12/18/2020 CLINICAL DATA:  Pain, weakness EXAM: PELVIS - 1-2 VIEW COMPARISON:  None. FINDINGS: Early spurring in the hip joints bilaterally. No acute bony abnormality. Specifically, no fracture, subluxation, or dislocation. IMPRESSION: No acute bony abnormality. Electronically Signed   By: Rolm Baptise M.D.   On: 12/18/2020 15:37    ____________________________________________   PROCEDURES  Procedure(s) performed (including Critical Care):  Procedures   ____________________________________________   INITIAL IMPRESSION / ASSESSMENT AND PLAN / ED COURSE ----------------------------------------- 4:05 PM on 12/18/2020 ----------------------------------------- Patient signed out to Dr. Corky Downs pending return of studies.  ____________________________________________   FINAL CLINICAL IMPRESSION(S) / ED DIAGNOSES  Final diagnoses:  Bilateral leg pain  Acute bilateral low back pain, unspecified whether sciatica present     ED Discharge Orders     None         Note:  This document was prepared using Dragon voice recognition software and may include unintentional dictation errors.    Nena Polio, MD 12/18/20 [phone]

## 2020-12-19 ENCOUNTER — Inpatient Hospital Stay: Payer: Medicare HMO

## 2020-12-19 DIAGNOSIS — E1165 Type 2 diabetes mellitus with hyperglycemia: Secondary | ICD-10-CM

## 2020-12-19 DIAGNOSIS — E872 Acidosis, unspecified: Secondary | ICD-10-CM

## 2020-12-19 DIAGNOSIS — I161 Hypertensive emergency: Secondary | ICD-10-CM

## 2020-12-19 DIAGNOSIS — I2510 Atherosclerotic heart disease of native coronary artery without angina pectoris: Secondary | ICD-10-CM

## 2020-12-19 DIAGNOSIS — M6282 Rhabdomyolysis: Secondary | ICD-10-CM

## 2020-12-19 DIAGNOSIS — Z794 Long term (current) use of insulin: Secondary | ICD-10-CM

## 2020-12-19 LAB — URINE CULTURE: Culture: <10000 — AB

## 2020-12-19 LAB — COMPREHENSIVE METABOLIC PANEL
ALT: 17 U/L (ref 0–44)
AST: 36 U/L (ref 15–41)
Albumin: 2.9 g/dL — ABNORMAL LOW (ref 3.5–5.0)
Alkaline Phosphatase: 93 U/L (ref 38–126)
Anion gap: 11 (ref 5–15)
BUN: 46 mg/dL — ABNORMAL HIGH (ref 8–23)
CO2: 21 mmol/L — ABNORMAL LOW (ref 22–32)
Calcium: 8.8 mg/dL — ABNORMAL LOW (ref 8.9–10.3)
Chloride: 106 mmol/L (ref 98–111)
Creatinine, Ser: 3.64 mg/dL — ABNORMAL HIGH (ref 0.44–1.00)
GFR, Estimated: 12 mL/min — ABNORMAL LOW (ref >60)
Glucose, Bld: 207 mg/dL — ABNORMAL HIGH (ref 70–99)
Potassium: 4.3 mmol/L (ref 3.5–5.1)
Sodium: 138 mmol/L (ref 135–145)
Total Bilirubin: 1 mg/dL (ref 0.3–1.2)
Total Protein: 7.3 g/dL (ref 6.5–8.1)

## 2020-12-19 LAB — PROTIME-INR
INR: 1.3 — ABNORMAL HIGH (ref 0.8–1.2)
Prothrombin Time: 16.1 seconds — ABNORMAL HIGH (ref 11.4–15.2)

## 2020-12-19 LAB — CORTISOL-AM, BLOOD: Cortisol - AM: 30 ug/dL — ABNORMAL HIGH (ref 6.7–22.6)

## 2020-12-19 LAB — CBC
HCT: 33.3 % — ABNORMAL LOW (ref 36.0–46.0)
Hemoglobin: 10.3 g/dL — ABNORMAL LOW (ref 12.0–15.0)
MCH: 26 pg (ref 26.0–34.0)
MCHC: 30.9 g/dL (ref 30.0–36.0)
MCV: 84.1 fL (ref 80.0–100.0)
Platelets: 337 10*3/uL (ref 150–400)
RBC: 3.96 MIL/uL (ref 3.87–5.11)
RDW: 17.5 % — ABNORMAL HIGH (ref 11.5–15.5)
WBC: 19.2 10*3/uL — ABNORMAL HIGH (ref 4.0–10.5)
nRBC: 0 % (ref 0.0–0.2)

## 2020-12-19 LAB — CBG MONITORING, ED
Glucose-Capillary: 154 mg/dL — ABNORMAL HIGH (ref 70–99)
Glucose-Capillary: 155 mg/dL — ABNORMAL HIGH (ref 70–99)
Glucose-Capillary: 178 mg/dL — ABNORMAL HIGH (ref 70–99)
Glucose-Capillary: 213 mg/dL — ABNORMAL HIGH (ref 70–99)
Glucose-Capillary: 241 mg/dL — ABNORMAL HIGH (ref 70–99)

## 2020-12-19 LAB — PROCALCITONIN: Procalcitonin: 1.24 ng/mL

## 2020-12-19 LAB — GLUCOSE, CAPILLARY: Glucose-Capillary: 141 mg/dL — ABNORMAL HIGH (ref 70–99)

## 2020-12-19 LAB — LACTIC ACID, PLASMA: Lactic Acid, Venous: 1.3 mmol/L (ref 0.5–1.9)

## 2020-12-19 MED ORDER — DIGOXIN 125 MCG PO TABS
0.0625 mg | ORAL_TABLET | ORAL | Status: DC
  Administered 2020-12-19 – 2020-12-27 (×5): 0.0625 mg via ORAL
  Filled 2020-12-19 (×5): qty 0.5

## 2020-12-19 MED ORDER — VANCOMYCIN HCL 1500 MG/300ML IV SOLN
1500.0000 mg | Freq: Once | INTRAVENOUS | Status: AC
  Administered 2020-12-19: 1500 mg via INTRAVENOUS
  Filled 2020-12-19: qty 300

## 2020-12-19 MED ORDER — ATORVASTATIN CALCIUM 20 MG PO TABS: 40.0000 mg | ORAL_TABLET | Freq: Every day | ORAL | Status: DC

## 2020-12-19 MED ORDER — AMPICILLIN SODIUM 2 G IJ SOLR
2.0000 g | Freq: Two times a day (BID) | INTRAMUSCULAR | Status: DC
Start: 2020-12-19 — End: 2020-12-20
  Administered 2020-12-19 – 2020-12-20 (×3): 2 g via INTRAVENOUS
  Filled 2020-12-19 (×4): qty 2000
  Filled 2020-12-19: qty 2

## 2020-12-19 MED ORDER — DIGOXIN 125 MCG PO TABS: 0.1250 mg | ORAL_TABLET | Freq: Every day | ORAL | Status: DC

## 2020-12-19 MED ORDER — SODIUM BICARBONATE 650 MG PO TABS: 650.0000 mg | ORAL_TABLET | Freq: Two times a day (BID) | ORAL | Status: DC

## 2020-12-19 MED ORDER — SODIUM CHLORIDE 0.9 % IV SOLN
2.0000 g | Freq: Two times a day (BID) | INTRAVENOUS | Status: DC
  Administered 2020-12-19 – 2020-12-20 (×2): 2 g via INTRAVENOUS
  Filled 2020-12-19 (×2): qty 20
  Filled 2020-12-19: qty 2
  Filled 2020-12-19: qty 20

## 2020-12-19 MED ORDER — DEXTROSE 5 % IV SOLN
10.0000 mg/kg | INTRAVENOUS | Status: DC
  Administered 2020-12-19 – 2020-12-20 (×2): 825 mg via INTRAVENOUS
  Filled 2020-12-19 (×3): qty 16.5

## 2020-12-19 MED ORDER — DONEPEZIL HCL 5 MG PO TABS
10.0000 mg | ORAL_TABLET | Freq: Every day | ORAL | Status: DC
  Administered 2020-12-20 – 2020-12-31 (×12): 10 mg via ORAL
  Filled 2020-12-19 (×13): qty 2

## 2020-12-19 MED ORDER — VANCOMYCIN HCL IN DEXTROSE 1-5 GM/200ML-% IV SOLN
1000.0000 mg | Freq: Once | INTRAVENOUS | Status: AC
  Administered 2020-12-19: 1000 mg via INTRAVENOUS

## 2020-12-19 MED ORDER — ISOSORBIDE MONONITRATE ER 30 MG PO TB24
60.0000 mg | ORAL_TABLET | Freq: Every day | ORAL | Status: DC
  Administered 2020-12-19 – 2021-01-01 (×14): 60 mg via ORAL
  Filled 2020-12-19 (×13): qty 2
  Filled 2020-12-19: qty 1

## 2020-12-19 MED ORDER — DOCUSATE SODIUM 100 MG PO CAPS
200.0000 mg | ORAL_CAPSULE | Freq: Every evening | ORAL | Status: DC | PRN
  Administered 2020-12-22: 200 mg via ORAL
  Filled 2020-12-19: qty 2

## 2020-12-19 MED ORDER — ANASTROZOLE 1 MG PO TABS
1.0000 mg | ORAL_TABLET | Freq: Every day | ORAL | Status: DC
  Administered 2020-12-20 – 2020-12-31 (×12): 1 mg via ORAL
  Filled 2020-12-19 (×14): qty 1

## 2020-12-19 MED ORDER — LABETALOL HCL 5 MG/ML IV SOLN
20.0000 mg | INTRAVENOUS | Status: DC | PRN
  Administered 2020-12-30: 03:00:00 20 mg via INTRAVENOUS
  Filled 2020-12-19: qty 4

## 2020-12-19 MED ORDER — MOMETASONE FURO-FORMOTEROL FUM 100-5 MCG/ACT IN AERO
2.0000 | INHALATION_SPRAY | Freq: Two times a day (BID) | RESPIRATORY_TRACT | Status: DC
  Administered 2020-12-19 – 2021-01-01 (×26): 2 via RESPIRATORY_TRACT
  Filled 2020-12-19: qty 8.8

## 2020-12-19 MED ORDER — HYDRALAZINE HCL 50 MG PO TABS
100.0000 mg | ORAL_TABLET | Freq: Three times a day (TID) | ORAL | Status: DC
  Administered 2020-12-19 – 2021-01-01 (×39): 100 mg via ORAL
  Filled 2020-12-19 (×40): qty 2

## 2020-12-19 MED ORDER — RANOLAZINE ER 500 MG PO TB12
500.0000 mg | ORAL_TABLET | Freq: Two times a day (BID) | ORAL | Status: DC
  Administered 2020-12-19 – 2020-12-20 (×3): 500 mg via ORAL
  Filled 2020-12-19 (×4): qty 1

## 2020-12-19 NOTE — Plan of Care (Signed)
The patient is admitted to 2 C with the diagnosis of sepsis. A & O x 3 with some confusion. Admission profile completed with the help of her grand daughter. Will continue to monitor.

## 2020-12-19 NOTE — ED Notes (Signed)
Dr Louanne Belton messaged via secure chat to notify of sustained SBP >200 after medication administration. Awaiting response.

## 2020-12-19 NOTE — ED Notes (Signed)
Per Dr Louanne Belton, continue sodium bicarb at 120ml/ hr

## 2020-12-19 NOTE — ED Notes (Signed)
Consent signed by grand daughter for LP.   Md aware

## 2020-12-19 NOTE — ED Notes (Signed)
Per CT, unable to get consent for LP d/t unable to reach daughter. This RN attempted to contact daughter at number listed, went straight to VM. VM left

## 2020-12-19 NOTE — TOC Progression Note (Signed)
Transition of Care [facility]) - Progression Note    Patient Details  Name: Jennifer Carrillo MRN: [id number] Date of Birth: February 24, 1933  [facility] CM/SW Contact  Shelbie Hutching, RN Phone Number: 12/19/2020, 9:53 AM  Clinical Narrative:    RNCM met with patient at the bedside.  Patient is confused and unable to state that she is in the hospital.  RNCM left a message for daughter/POA for return call.          Expected Discharge Plan and Services                                                 Social Determinants of Health (SDOH) Interventions    Readmission Risk Interventions No flowsheet data found.

## 2020-12-19 NOTE — Progress Notes (Signed)
PROGRESS NOTE  Jennifer Carrillo IWP:[id number] DOB: 1933/06/27 DOA: 12/18/2020 PCP: [name], MD   LOS: 1 day   Brief narrative:  Jennifer Carrillo is a 87 y.o. female with past medical history of diabetes mellitus type 2, hypertension, CKD stage IV, coronary artery disease, history of shingles diagnosed on 11/28 treated with Valtrex and gabapentin presented to hospital with 1 day history of diffuse pain extending from her lower back to her legs to the level where she was unable to get out of the bed.  History was limited due to patient's confusion.  In the ED, patient was noted to have a elevated blood pressure of 202/80 with a temperature of 101.8 F and was mildly tachycardic.  WBC initially was 15,000.  Creatinine was 4.1 from baseline of 2.9 with a bicarb of 18 and a normal anion gap.  Chest x-ray did not show any acute findings.  MRI of the lumbar spine did not show any evidence of spinal infection or fracture.  Patient received septic fluid bolus in the ED including broad-spectrum antibiotic and  was then admitted hospital for further evaluation and treatment.     Assessment/Plan:  Principal Problem:   Sepsis ([place]) Active Problems:   PAD (peripheral artery disease) ([place])   Coronary artery disease involving native coronary artery of native heart   Anemia of chronic kidney failure, stage 4 (severe) (HCC)   Acute kidney injury superimposed on CKD IV (HCC)   Metabolic acidosis   Rhabdomyolysis   Hypertensive emergency without congestive heart failure   Hyperglycemia due to type 2 diabetes mellitus ([place])   Shingles 12/05/2020   Sepsis, severe  Patient presented with signs of sepsis including fever, tachycardia, leukocytosis and acute kidney injury.  Recent shingles.  Patient with fever, tachycardia, leukocytosis, AKI.  L procalcitonin was 0.8 followed by 1.2.  Lactate was within normal limits.  On vancomycin and cefepime, acyclovir.  Will change cefepime to Rocephin due to renal  dysfunction.  Add ampicillin.  IR has been consulted for lumbar puncture including HSV labs.  Chest x-ray was negative for infiltrate.  MRI of the lumbosacral spine was negative for infection.  Urinalysis was unremarkable.  Hold Plavix.  Last dose was on 12/23/2018  Altered mental status likely metabolic encephalopathy. Likely secondary to sepsis, accelerated hypertension.  Was empirically started on acyclovir for possible meningitis.  IR has been consulted for LP.  Patient is disoriented to place.  Husband at bedside.  Hold gabapentin for now.    Acute kidney injury superimposed on CKD IV with metabolic acidosis Likely prerenal in etiology.  CK level is mildly elevated at 804.  On IV fluids with bicarb.  Check BMP and CK levels in a.m.  Continue IV fluids for now.  Creatinine has slightly trended down at this time.  Hold HCTZ.,  Telmisartan.  Check levels in a.m.   Mild cognitive dysfunction At baseline.  Continue delirium precautions.  Resume Aricept.     Rhabdomyolysis, nontraumatic Mild.  Continue IV fluids.  Hold statins.     Hypertensive urgency.  BP high of 233/89.  Likely secondary to unable to take medication.  Continue amlodipine IV hydralazine.  Hyperglycemia due to type 2 diabetes mellitus  Continue sliding scale insulin Accu-Cheks diabetic diet when p.o. able.  Patient is on NPH 20 units twice daily at home.     Shingles 12/05/2020 Completed Gabapentin and Valtrex.  Hold gabapentin.     PAD (peripheral artery disease -Continue Ranexa, hold Plavix for now.  Continue to hold statins for now.     Coronary artery disease involving native coronary artery of native heart Continue Ranexa,  isosorbide.  Hold statins, Plavix for now.  Hold Plavix for lumbar puncture.     Anemia of chronic kidney failure, stage 4 We will continue to monitor hemoglobin  History of breast cancer Continue Arimidex from home.  Generalized weakness at home.  Patient has been stressed that she has been  weaker to the point that she has not been able to do much.  We will get PT evaluation when stable.  DVT prophylaxis: SCDs Start: 12/18/20 2238   Code Status: Full code  Family Communication: Spoke with the patient's husband at bedside.  Status is: Inpatient  Remains inpatient appropriate because: Mental status, possible infection, need for lumbar puncture  Consultants: IR for lumbar puncture  Procedures: None yet  Anti-infectives:  Vancomycin cefepime and acyclovir  Anti-infectives (From admission, onward)    Start     Dose/Rate Route Frequency Ordered Stop   12/21/20 0200  vancomycin (VANCOREADY) IVPB 750 mg/150 mL        750 mg 150 mL/hr over 60 Minutes Intravenous Every 48 hours 12/18/20 2312     12/19/20 2200  ceFEPIme (MAXIPIME) 2 g in sodium chloride 0.9 % 100 mL IVPB        2 g 200 mL/hr over 30 Minutes Intravenous Every 24 hours 12/18/20 2309     12/19/20 0200  vancomycin (VANCOCIN) IVPB 1000 mg/200 mL premix       See Hyperspace for full Linked Orders Report.   1,000 mg 200 mL/hr over 60 Minutes Intravenous  Once 12/19/20 0042 12/19/20 0204   12/19/20 0200  vancomycin (VANCOREADY) IVPB 1500 mg/300 mL       See Hyperspace for full Linked Orders Report.   1,500 mg 150 mL/hr over 120 Minutes Intravenous  Once 12/19/20 0042 12/19/20 0453   12/19/20 0130  acyclovir (ZOVIRAX) 825 mg in dextrose 5 % 150 mL IVPB        10 mg/kg  82.3 kg (Adjusted) 166.5 mL/hr over 60 Minutes Intravenous Every 24 hours 12/19/20 0044     12/18/20 2330  acyclovir (ZOVIRAX) 825 mg in dextrose 5 % 150 mL IVPB  Status:  Discontinued        10 mg/kg  82.3 kg (Adjusted) 166.5 mL/hr over 60 Minutes Intravenous Every 24 hours 12/18/20 2318 12/19/20 0044   12/18/20 2300  metroNIDAZOLE (FLAGYL) IVPB 500 mg        500 mg 100 mL/hr over 60 Minutes Intravenous Every 12 hours 12/18/20 2251 12/25/20 2259   12/18/20 2230  vancomycin (VANCOREADY) IVPB 1500 mg/300 mL  Status:  Discontinued       See  Hyperspace for full Linked Orders Report.   1,500 mg 150 mL/hr over 120 Minutes Intravenous  Once 12/18/20 2123 12/19/20 0042   12/18/20 2130  ceFEPIme (MAXIPIME) 2 g in sodium chloride 0.9 % 100 mL IVPB        2 g 200 mL/hr over 30 Minutes Intravenous  Once 12/18/20 2117 12/19/20 0000   12/18/20 2130  metroNIDAZOLE (FLAGYL) IVPB 500 mg  Status:  Discontinued        500 mg 100 mL/hr over 60 Minutes Intravenous  Once 12/18/20 2117 12/18/20 2342   12/18/20 2130  vancomycin (VANCOCIN) IVPB 1000 mg/200 mL premix  Status:  Discontinued        1,000 mg 200 mL/hr over 60 Minutes Intravenous  Once 12/18/20 2117 12/18/20  2123   12/18/20 2130  vancomycin (VANCOCIN) IVPB 1000 mg/200 mL premix  Status:  Discontinued       See Hyperspace for full Linked Orders Report.   1,000 mg 200 mL/hr over 60 Minutes Intravenous  Once 12/18/20 2123 12/19/20 0042       Subjective: Today, patient was seen and examined at bedside.  Patient husband at bedside states that that she is in and out and talks different things.  Denies any headache, nausea, vomiting.  Objective: Vitals:   12/19/20 0700 12/19/20 0726  BP: (!) 191/63   Pulse:  (!) 108  Resp: (!) 21 (!) 21  Temp:    SpO2:  95%    Intake/Output Summary (Last 24 hours) at 12/19/2020 0751 Last data filed at 12/19/2020 0723 Gross per 24 hour  Intake 813.74 ml  Output 450 ml  Net 363.74 ml   Filed Weights   12/18/20 0751 12/18/20 0755  Weight: 82 kg 113.4 kg   Body mass index is 39.16 kg/m.   Physical Exam:  GENERAL: Patient is alert awake without disoriented to place.  Oriented to person and time  HENT: No scleral pallor or icterus. Pupils equally reactive to light. Oral mucosa is moist NECK: is supple, no gross swelling noted. CHEST: Clear to auscultation. No crackles or wheezes.  Diminished breath sounds bilaterally. CVS: S1 and S2 heard, no murmur. Regular rate and rhythm.  ABDOMEN: Soft, non-tender, bowel sounds are  present. EXTREMITIES: No edema. CNS: Cranial nerves are intact.  Moving extremities but generalized weakness noted. SKIN: warm and dry without rashes.  Data Review: I have personally reviewed the following laboratory data and studies,  CBC: Recent Labs  Lab 12/18/20 0834 12/19/20 0616  WBC 15.2* 19.2*  HGB 11.0* 10.3*  HCT 35.0* 33.3*  MCV 86.4 84.1  PLT 331 741   Basic Metabolic Panel: Recent Labs  Lab 12/18/20 0834 12/19/20 0616  NA 138 138  K 4.9 4.3  CL 110 106  CO2 18* 21*  GLUCOSE 219* 207*  BUN 46* 46*  CREATININE 4.11* 3.64*  CALCIUM 9.0 8.8*   Liver Function Tests: Recent Labs  Lab 12/18/20 0834 12/19/20 0616  AST 28 36  ALT 15 17  ALKPHOS 91 93  BILITOT 1.0 1.0  PROT 7.0 7.3  ALBUMIN 3.3* 2.9*   No results for input(s): LIPASE, AMYLASE in the last 168 hours. No results for input(s): AMMONIA in the last 168 hours. Cardiac Enzymes: Recent Labs  Lab 12/18/20 0834  CKTOTAL 804*   BNP (last 3 results) No results for input(s): BNP in the last 8760 hours.  ProBNP (last 3 results) No results for input(s): PROBNP in the last 8760 hours.  CBG: Recent Labs  Lab 12/18/20 2121 12/19/20 0028 12/19/20 0504 12/19/20 0729  GLUCAP 244* 241* 213* 155*   Recent Results (from the past 240 hour(s))  Resp Panel by RT-PCR (Flu A&B, Covid) Nasopharyngeal Swab     Status: None   Collection Time: 12/18/20  5:19 PM   Specimen: Nasopharyngeal Swab; Nasopharyngeal(NP) swabs in vial transport medium  Result Value Ref Range Status   SARS Coronavirus 2 by RT PCR NEGATIVE NEGATIVE Final    Comment: (NOTE) SARS-CoV-2 target nucleic acids are NOT DETECTED.  The SARS-CoV-2 RNA is generally detectable in upper respiratory specimens during the acute phase of infection. The lowest concentration of SARS-CoV-2 viral copies this assay can detect is 138 copies/mL. A negative result does not preclude SARS-Cov-2 infection and should not be used as the  sole basis for  treatment or other patient management decisions. A negative result may occur with  improper specimen collection/handling, submission of specimen other than nasopharyngeal swab, presence of viral mutation(s) within the areas targeted by this assay, and inadequate number of viral copies(<138 copies/mL). A negative result must be combined with clinical observations, patient history, and epidemiological information. The expected result is Negative.  Fact Sheet for Patients:  EntrepreneurPulse.com.au  Fact Sheet for Healthcare Providers:  IncredibleEmployment.be  This test is no t yet approved or cleared by the [place] FDA and  has been authorized for detection and/or diagnosis of SARS-CoV-2 by FDA under an Emergency Use Authorization (EUA). This EUA will remain  in effect (meaning this test can be used) for the duration of the COVID-19 declaration under Section 564(b)(1) of the Act, 21 U.S.C.section 360bbb-3(b)(1), unless the authorization is terminated  or revoked sooner.       Influenza A by PCR NEGATIVE NEGATIVE Final   Influenza B by PCR NEGATIVE NEGATIVE Final    Comment: (NOTE) The Xpert Xpress SARS-CoV-2/FLU/RSV plus assay is intended as an aid in the diagnosis of influenza from Nasopharyngeal swab specimens and should not be used as a sole basis for treatment. Nasal washings and aspirates are unacceptable for Xpert Xpress SARS-CoV-2/FLU/RSV testing.  Fact Sheet for Patients: EntrepreneurPulse.com.au  Fact Sheet for Healthcare Providers: IncredibleEmployment.be  This test is not yet approved or cleared by the [place] FDA and has been authorized for detection and/or diagnosis of SARS-CoV-2 by FDA under an Emergency Use Authorization (EUA). This EUA will remain in effect (meaning this test can be used) for the duration of the COVID-19 declaration under Section 564(b)(1) of the Act, 21  U.S.C. section 360bbb-3(b)(1), unless the authorization is terminated or revoked.  Performed at [facility], [address] [place], [place] [address]   Blood culture (routine x 2)     Status: None (Preliminary result)   Collection Time: 12/18/20 10:30 PM   Specimen: BLOOD  Result Value Ref Range Status   Specimen Description BLOOD BLOOD RIGHT HAND  Final   Special Requests   Final    BOTTLES DRAWN AEROBIC AND ANAEROBIC Blood Culture adequate volume   Culture   Final    NO GROWTH < 12 HOURS Performed at [facility], [address], [place], [place] [address]    Report Status PENDING  Incomplete  Blood culture (routine x 2)     Status: None (Preliminary result)   Collection Time: 12/18/20 10:35 PM   Specimen: BLOOD  Result Value Ref Range Status   Specimen Description BLOOD BLOOD LEFT HAND  Final   Special Requests   Final    BOTTLES DRAWN AEROBIC AND ANAEROBIC Blood Culture adequate volume   Culture   Final    NO GROWTH < 12 HOURS Performed at [facility], [address] [place], [place] [address]    Report Status PENDING  Incomplete     Studies: DG Chest 1 View  Result Date: 12/18/2020 CLINICAL DATA:  Weakness, lower extremity pain EXAM: CHEST  1 VIEW COMPARISON:  11/26/2020 FINDINGS: Single frontal view of the chest demonstrates a stable cardiac silhouette. Stable atherosclerosis. No acute airspace disease, effusion, or pneumothorax. No acute bony abnormality. IMPRESSION: 1. No acute intrathoracic process. Electronically Signed   By: Randa Ngo M.D.   On: 12/18/2020 15:37   DG Lumbar Spine 2-3 Views  Result Date: 12/18/2020 CLINICAL DATA:  Weakness, pain EXAM: LUMBAR SPINE - 2-3 VIEW COMPARISON:  None. FINDINGS: Degenerative spurring throughout the lumbar spine. Mild disc space narrowing at L5-S1. Degenerative facet disease. Normal alignment. No fracture. Aortic atherosclerosis. Left renal artery and iliac artery stents bilaterally.  IMPRESSION: Degenerative changes.  No acute bony abnormality. Electronically Signed   By: Rolm Baptise M.D.   On: 12/18/2020 15:38   DG Pelvis 1-2 Views  Result Date: 12/18/2020 CLINICAL DATA:  Pain, weakness EXAM: PELVIS - 1-2 VIEW COMPARISON:  None. FINDINGS: Early spurring in the hip joints bilaterally. No acute bony abnormality. Specifically, no fracture, subluxation, or dislocation. IMPRESSION: No acute bony abnormality. Electronically Signed   By: Rolm Baptise M.D.   On: 12/18/2020 15:37   MR LUMBAR SPINE WO CONTRAST  Result Date: 12/18/2020 CLINICAL DATA:  Lumbar radiculopathy. Rule out infection. Low back pain EXAM: MRI LUMBAR SPINE WITHOUT CONTRAST TECHNIQUE: Multiplanar, multisequence MR imaging of the lumbar spine was performed. No intravenous contrast was administered. COMPARISON:  Lumbar spine radiographs 12/18/2020 FINDINGS: Segmentation:  5 lumbar segments as noted on x-ray. Alignment: Slight anterolisthesis L4-5. Otherwise normal alignment. Vertebrae: Negative for fracture or mass. No evidence of spinal infection. Conus medullaris and cauda equina: Conus extends to the L1-2 level. Conus and cauda equina appear normal. Paraspinal and other soft tissues: Negative for paraspinous mass or adenopathy. Disc levels: L1-2: Negative L2-3: Mild disc bulging.  Negative for stenosis L3-4: Diffuse bulging of the disc. Small right foraminal disc protrusion. Bilateral facet degeneration. Mild subarticular stenosis bilaterally. Spinal canal adequate in size L4-5: Mild anterolisthesis. Diffuse disc bulging and bilateral facet degeneration. Mild spinal stenosis. Neural foramina patent bilaterally. L5-S1: Disc degeneration with diffuse endplate spurring. Moderate subarticular stenosis bilaterally. IMPRESSION: 1. Mild lumbar degenerative change.  Mild spinal stenosis as above 2. No evidence of spinal infection or fracture. Electronically Signed   By: Franchot Gallo M.D.   On: 12/18/2020 20:06      Flora Lipps, MD  [facility] 12/19/2020  If 7PM-7AM, please contact night-coverage

## 2020-12-19 NOTE — ED Notes (Signed)
Report messaged to [facility] w rn floor nurse

## 2020-12-19 NOTE — ED Notes (Signed)
Per Dr Louanne Belton, give PO hydralazine

## 2020-12-19 NOTE — ED Notes (Signed)
Pt more alert,  family with pt

## 2020-12-19 NOTE — ED Notes (Signed)
Lab at the bedside 

## 2020-12-19 NOTE — ED Notes (Signed)
Fsbs 178

## 2020-12-19 NOTE — ED Notes (Signed)
Pt continues to be confused, asking about people arguing in hallway and if somebody shot a gun. Pt re-oriented

## 2020-12-19 NOTE — Consult Note (Signed)
Pharmacy Antibiotic Note  ANAM Jennifer Carrillo is a 87 y.o. female with medical history including diabetes, HTN, CKD stage IV, CAD, recently diagnosed shingles in T1 dermatome treated with Valtrex admitted on 12/18/2020 with  BIBEMS from home c/o leg pain x several weeks with acute worsening the evening prior to presentation . Presentation concerning for sepsis with unclear etiology. In the presence of altered mental status, there is concern for meningitis. Pharmacy has been consulted for vancomycin & acyclovir dosing. Patient is also ordered ampicillin & ceftriaxone.  LP is pending  Plan:  Acyclovir 825 mg (10 mg/kg AdjBW) IV q24h  Vancomycin 750 mg IV q48h --Calculated AUC: 489, Cmin 14.7 --Daily Scr per protocol --Levels at steady state as clinically indicated, will check a random level tomorrow AM to evaluate clearance --Goal trough 15 - 20 mcg/mL for meningitis  Height: 5\' 7"  (170.2 cm) Weight: 113.4 kg (250 lb) IBW/kg (Calculated) : 61.6  Temp (24hrs), Avg:100.5 F (38.1 C), Min:99.2 F (37.3 C), Max:101.8 F (38.8 C)  Recent Labs  Lab 12/18/20 0834 12/18/20 2115 12/19/20 0130 12/19/20 0616  WBC 15.2*  --   --  19.2*  CREATININE 4.11*  --   --  3.64*  LATICACIDVEN  --  1.8 1.3  --     Estimated Creatinine Clearance: 14.7 mL/min (A) (by C-G formula based on SCr of 3.64 mg/dL (H)).    Allergies  Allergen Reactions   Benazepril Nausea Only    Antimicrobials this admission: Cefepime 12/11 x 1 Metronidazole 12/12 x 1 Vancomycin 12/12 >>  Acyclovir 12/12 >>  Ampicillin 12/12 >>  Ceftriaxone 12/12 >>   Dose adjustments this admission: N/A  Microbiology results: 12/11 BCx: NGTD 12/11 UCx: pending   Thank you for allowing pharmacy to be a part of this patient's care.  Benita Gutter 12/19/2020 2:34 PM

## 2020-12-19 NOTE — ED Notes (Signed)
Dr Louanne Belton messaged via secure chat to verify  additional bag of sodium bicarb for maintenance fluids.

## 2020-12-19 NOTE — TOC Initial Note (Signed)
Transition of Care [facility]) - Initial/Assessment Note    Patient Details  Name: Jennifer Carrillo MRN: [id number] Date of Birth: June 30, 1933  Transition of Care [facility]) CM/SW Contact:    Shelbie Hutching, RN Phone Number: 12/19/2020, 1:35 PM  Clinical Narrative:                 RNCM met with patient at the bedside this morning.  Patient is confused and believes that she is at there daughter's home.  RNCM was able to speak with patient's daughter, Jennifer Carrillo, via phone this afternoon.   Jennifer Carrillo reports that patient lives at home with patient's husband and son.  Patient is independent at home and walks with a cane.  Husband provide transport patient does not drive.  Patient is current with PCP and has no trouble obtaining medications.  Daughter reports that patient is a little forgetful but not confused at home like she is now. They would like [place] at discharge, if recommended they would agree to rehab but prefer for patient to go home.    TOC will follow throughout hospitalization.   Expected Discharge Plan: [place] Barriers to Discharge: Continued Medical Work up   Patient Goals and CMS Choice Patient states their goals for this hospitalization and ongoing recovery are:: Patient is confused but family would like for her to get back to her baseline of being independent at home CMS Medicare.gov Compare Post Acute Care list provided to:: Patient Represenative (must comment) Choice offered to / list presented to : Adult Children  Expected Discharge Plan and Services Expected Discharge Plan: [place]   Discharge Planning Services: CM Consult Post Acute Care Choice: [address] arrangements for the past 2 months: Single Family Home                 DME Arranged: N/A DME Agency: NA                  Prior Living Arrangements/Services Living arrangements for the past 2 months: Single Family Home Lives with:: Spouse, Adult Children Patient language  and need for interpreter reviewed:: Yes Do you feel safe going back to the place where you live?: Yes      Need for Family Participation in Patient Care: Yes (Comment) Care giver support system in place?: Yes (comment) (daughter and husband) Current home services: DME (cane and walker) Criminal Activity/Legal Involvement Pertinent to Current Situation/Hospitalization: No - Comment as needed  Activities of Daily Living      Permission Sought/Granted Permission sought to share information with : Case Manager, Family Supports Permission granted to share information with : Yes, Verbal Permission Granted  Share Information with NAME: Rami Waddle POA  Permission granted to share info w AGENCY: home health agencies  Permission granted to share info w Relationship: daughter  Permission granted to share info w Contact Information: [phone]  Emotional Assessment Appearance:: Appears stated age Attitude/Demeanor/Rapport:  (confused) Affect (typically observed): Pleasant Orientation: : Oriented to Self Alcohol / Substance Use: Not Applicable Psych Involvement: No (comment)  Admission diagnosis:  Sepsis [facility]) [A41.9] Patient Active Problem List   Diagnosis Date Noted   Acute kidney injury superimposed on CKD IV ([place]) 16/96/7893   Metabolic acidosis 81/01/7508   Rhabdomyolysis 12/18/2020   Sepsis ([place]) 12/18/2020   Hypertensive emergency without congestive heart failure 12/18/2020   Hyperglycemia due to type 2 diabetes mellitus ([place]) 12/18/2020   Shingles 12/05/2020 12/18/2020   Anemia of chronic kidney failure,  stage 4 (severe) ([place]) 08/31/2020   Carotid stenosis, symptomatic w/o infarct 06/25/2018   Hypertension 06/18/2018   Carbuncle and furuncle of buttock 06/18/2018   Coronary artery disease involving native coronary artery of native heart 05/19/2018   Old inferior wall myocardial infarction 05/19/2018   Stable angina ([place]) 03/31/2018   Abnormal ECG 03/07/2018   Benign  essential HTN 03/07/2018   Bilateral carotid artery stenosis 03/07/2018   SOBOE (shortness of breath on exertion) 03/07/2018   Atherosclerotic peripheral vascular disease with intermittent claudication ([place]) 03/07/2018   Carotid stenosis 02/26/2018   Renal artery stenosis (HCC) 02/26/2018   Anemia 02/05/2018   Leg pain 01/13/2018   PAD (peripheral artery disease) ([place]) 01/13/2018   Diabetes mellitus without complication ([place]) 61/91/5502   Renovascular hypertension 04/09/2017   Primary osteoarthritis of both knees 03/12/2017   Malignant neoplasm of breast in female, estrogen receptor positive ([place]) 12/16/2015   Iron deficiency anemia 12/08/2015   PCP:  [name], MD Pharmacy:   [id number] [place] - [address] [place]Phone: [phone]: [phone] [place]Phone: [phone]: [phone] [id number] [place] [address] [place]: [phone]: [phone]), [place] [address] [id number]: [phone]: [phone]   Social Determinants of Health (SDOH) Interventions    Readmission Risk Interventions Readmission Risk Prevention Plan 12/19/2020  Transportation Screening Complete  Medication [place] ([place]  PCP or Specialist appointment within 3-5 days of discharge Complete  HRI or Home Care Consult Complete  SW Recovery Care/Counseling Consult Complete  Palliative Care Screening Not Ap[place] City[place]plete  Some recent data might be hidden

## 2020-12-19 NOTE — ED Notes (Signed)
Pt nonverbal, moans out.  Iv fluids infusing.  Nsr on monitor.

## 2020-12-20 ENCOUNTER — Inpatient Hospital Stay: Payer: Medicare HMO

## 2020-12-20 ENCOUNTER — Encounter: Payer: Self-pay | Admitting: Internal Medicine

## 2020-12-20 DIAGNOSIS — N189 Chronic kidney disease, unspecified: Secondary | ICD-10-CM

## 2020-12-20 DIAGNOSIS — N184 Chronic kidney disease, stage 4 (severe): Secondary | ICD-10-CM

## 2020-12-20 DIAGNOSIS — M064 Inflammatory polyarthropathy: Principal | ICD-10-CM

## 2020-12-20 DIAGNOSIS — D631 Anemia in chronic kidney disease: Secondary | ICD-10-CM

## 2020-12-20 DIAGNOSIS — N179 Acute kidney failure, unspecified: Secondary | ICD-10-CM

## 2020-12-20 LAB — BASIC METABOLIC PANEL
Anion gap: 9 (ref 5–15)
BUN: 49 mg/dL — ABNORMAL HIGH (ref 8–23)
CO2: 27 mmol/L (ref 22–32)
Calcium: 8.2 mg/dL — ABNORMAL LOW (ref 8.9–10.3)
Chloride: 103 mmol/L (ref 98–111)
Creatinine, Ser: 3.59 mg/dL — ABNORMAL HIGH (ref 0.44–1.00)
GFR, Estimated: 12 mL/min — ABNORMAL LOW (ref >60)
Glucose, Bld: 108 mg/dL — ABNORMAL HIGH (ref 70–99)
Potassium: 3.8 mmol/L (ref 3.5–5.1)
Sodium: 139 mmol/L (ref 135–145)

## 2020-12-20 LAB — CSF CELL COUNT WITH DIFFERENTIAL
Eosinophils, CSF: 0 %
Eosinophils, CSF: 0 %
Lymphs, CSF: 88 %
Lymphs, CSF: 2 %
Monocyte-Macrophage-Spinal Fluid: 10 %
Monocyte-Macrophage-Spinal Fluid: 0 %
Other Cells, CSF: 0
RBC Count, CSF: 0 /mm3 (ref 0–3)
RBC Count, CSF: 0 /mm3 (ref 0–3)
Segmented Neutrophils-CSF: 2 %
Segmented Neutrophils-CSF: 0 %
Tube #: 3
Tube #: 1
WBC, CSF: 5 /mm3 (ref 0–5)
WBC, CSF: 3 /mm3 (ref 0–5)

## 2020-12-20 LAB — CBC
HCT: 28.2 % — ABNORMAL LOW (ref 36.0–46.0)
Hemoglobin: 9.2 g/dL — ABNORMAL LOW (ref 12.0–15.0)
MCH: 26.9 pg (ref 26.0–34.0)
MCHC: 32.6 g/dL (ref 30.0–36.0)
MCV: 82.5 fL (ref 80.0–100.0)
Platelets: 292 10*3/uL (ref 150–400)
RBC: 3.42 MIL/uL — ABNORMAL LOW (ref 3.87–5.11)
RDW: 17.4 % — ABNORMAL HIGH (ref 11.5–15.5)
WBC: 14.4 10*3/uL — ABNORMAL HIGH (ref 4.0–10.5)
nRBC: 0 % (ref 0.0–0.2)

## 2020-12-20 LAB — GLUCOSE, CAPILLARY
Glucose-Capillary: 106 mg/dL — ABNORMAL HIGH (ref 70–99)
Glucose-Capillary: 119 mg/dL — ABNORMAL HIGH (ref 70–99)
Glucose-Capillary: 119 mg/dL — ABNORMAL HIGH (ref 70–99)
Glucose-Capillary: 121 mg/dL — ABNORMAL HIGH (ref 70–99)
Glucose-Capillary: 123 mg/dL — ABNORMAL HIGH (ref 70–99)
Glucose-Capillary: 127 mg/dL — ABNORMAL HIGH (ref 70–99)
Glucose-Capillary: 128 mg/dL — ABNORMAL HIGH (ref 70–99)

## 2020-12-20 LAB — GLUCOSE, CSF: Glucose, CSF: 90 mg/dL — ABNORMAL HIGH (ref 40–70)

## 2020-12-20 LAB — PROCALCITONIN: Procalcitonin: 1.7 ng/mL

## 2020-12-20 LAB — MAGNESIUM: Magnesium: 1.8 mg/dL (ref 1.7–2.4)

## 2020-12-20 LAB — HEMOGLOBIN A1C
Hgb A1c MFr Bld: 5.3 % (ref 4.8–5.6)
Mean Plasma Glucose: 105 mg/dL

## 2020-12-20 LAB — VANCOMYCIN, RANDOM: Vancomycin Rm: 23

## 2020-12-20 LAB — PHOSPHORUS: Phosphorus: 3.7 mg/dL (ref 2.5–4.6)

## 2020-12-20 LAB — CK: Total CK: 286 U/L — ABNORMAL HIGH (ref 38–234)

## 2020-12-20 LAB — PROTEIN, CSF: Total  Protein, CSF: 60 mg/dL — ABNORMAL HIGH (ref 15–45)

## 2020-12-20 MED ORDER — VANCOMYCIN HCL 750 MG/150ML IV SOLN
750.0000 mg | INTRAVENOUS | Status: DC
  Filled 2020-12-20: qty 150

## 2020-12-20 MED ORDER — RANOLAZINE ER 500 MG PO TB12
500.0000 mg | ORAL_TABLET | Freq: Every day | ORAL | Status: DC
  Administered 2020-12-21 – 2020-12-23 (×3): 500 mg via ORAL
  Filled 2020-12-20 (×3): qty 1

## 2020-12-20 MED ORDER — CEFTRIAXONE SODIUM 2 G IJ SOLR
2.0000 g | INTRAMUSCULAR | Status: DC
  Administered 2020-12-21: 18:00:00 2 g via INTRAVENOUS
  Filled 2020-12-20: qty 20
  Filled 2020-12-20: qty 2

## 2020-12-20 NOTE — Progress Notes (Signed)
PROGRESS NOTE  Jennifer Carrillo JGG:[id number] DOB: 1933-02-25 DOA: 12/18/2020 PCP: [name], MD   LOS: 2 days   Brief narrative:  Jennifer Carrillo is a 87 y.o. female with past medical history of diabetes mellitus type 2, hypertension, CKD stage IV, coronary artery disease, history of shingles diagnosed on 11/28 treated with Valtrex and gabapentin presented to hospital with 1 day history of diffuse pain extending from her lower back to her legs to the level where she was unable to get out of the bed.  History was limited due to patient's confusion.  In the ED, patient was noted to have a elevated blood pressure of 202/80 with a temperature of 101.8 F and was mildly tachycardic.  WBC initially was 15,000.  Creatinine was 4.1 from baseline of 2.9 with a bicarb of 18 and a normal anion gap.  Chest x-ray did not show any acute findings.  MRI of the lumbar spine did not show any evidence of spinal infection or fracture.  Patient received septic fluid bolus in the ED including broad-spectrum antibiotic and  was then admitted hospital for further evaluation and treatment.     Assessment/Plan:  Principal Problem:   Sepsis ([place]) Active Problems:   PAD (peripheral artery disease) ([place])   Coronary artery disease involving native coronary artery of native heart   Anemia of chronic kidney failure, stage 4 (severe) (H[facility])   Acute kidney injury superimposed on CKD IV ([facility])   Metabolic acidosis   Rhabdomyolysis   Hypertensive emergency without congestive heart failure   Hyperglycemia due to type 2 diabetes mellitus ([place])   Shingles 12/05/2020   Sepsis, severe  Patient presented with signs of sepsis including fever, tachycardia, leukocytosis and acute kidney injury.  Recent history of treated shingles.    L procalcitonin was 0.8 followed by 1.2.  Lactate was within normal limits.  On vancomycin and rocephin, ampicillin and acyclovir.  IR has been consulted for lumbar puncture and will send labs  including HSV labs.  Chest x-ray was negative for infiltrate.  MRI of the lumbosacral spine was negative for infection.  Urinalysis was unremarkable.    Altered mental status likely metabolic encephalopathy. Likely secondary to sepsis, accelerated hypertension.  Could not rule out meningeal infection.  IR has been consulted for LP.  Continue to hold Plavix and gabapentin.  Patient has been empirically treated for meningitis.   Acute kidney injury superimposed on CKD IV with metabolic acidosis Likely prerenal in etiology.  CK level is mildly elevated at 804.  On IV fluids with bicarb.  Continue to  Hold HCTZ.,  Telmisartan.  Creatinine at 3.5 today from 3.6.  Baseline creatinine probably between 3-3.4.  Mild cognitive dysfunction with metabolic encephalopathy. At baseline.  Continue delirium precautions.  Resumed Aricept.     Rhabdomyolysis, nontraumatic Mild.  Continue IV fluids with sodium bicarb.  Hold statins.  CK level trending down.     Hypertensive urgency.  BP high of 233/89 condition.  Has improved with initiation of medication.  We will continue to monitor.  Hyperglycemia due to type 2 diabetes mellitus  Continue sliding scale insulin Accu-Cheks diabetic diet when p.o. able.  Patient is on NPH 20 units twice daily at home.  Latest POC glucose of 106.   History of shingles 12/05/2020 Completed Gabapentin and Valtrex.  Complains of a burning sensation and pain over the bilateral lower extremities.  Could restart the low-dose gabapentin.     PAD (peripheral artery disease Continue Ranexa, hold Plavix  for now.  Continue to hold statins for now.     Coronary artery disease involving native coronary artery of native heart Continue Ranexa,  isosorbide.  Hold statins, Plavix for now.  Hold Plavix for lumbar puncture.     Anemia of chronic kidney failure, stage 4 We will continue to monitor hemoglobin  History of breast cancer Continue Arimidex from home.  Generalized weakness at  home.  PT evaluation when stable.  DVT prophylaxis: SCDs Start: 12/18/20 2238   Code Status: Full code  Family Communication:  Spoke with the patient's daughter at bedside.  Status is: Inpatient  Remains inpatient appropriate because: Mental status, possible CNS infection, need for lumbar puncture  Consultants: IR for lumbar puncture  Procedures: None yet  Anti-infectives:  Vancomycin, Rocephin, ampicillin and acyclovir  Anti-infectives (From admission, onward)    Start     Dose/Rate Route Frequency Ordered Stop   12/21/20 0200  vancomycin (VANCOREADY) IVPB 750 mg/150 mL        750 mg 150 mL/hr over 60 Minutes Intravenous Every 48 hours 12/18/20 2312     12/19/20 2200  ceFEPIme (MAXIPIME) 2 g in sodium chloride 0.9 % 100 mL IVPB  Status:  Discontinued        2 g 200 mL/hr over 30 Minutes Intravenous Every 24 hours 12/18/20 2309 12/19/20 1114   12/19/20 2200  cefTRIAXone (ROCEPHIN) 2 g in sodium chloride 0.9 % 100 mL IVPB        2 g 200 mL/hr over 30 Minutes Intravenous Every 12 hours 12/19/20 1114     12/19/20 1130  ampicillin (OMNIPEN) 2 g in sodium chloride 0.9 % 100 mL IVPB        2 g 300 mL/hr over 20 Minutes Intravenous Every 12 hours 12/19/20 1116     12/19/20 0200  vancomycin (VANCOCIN) IVPB 1000 mg/200 mL premix       See Hyperspace for full Linked Orders Report.   1,000 mg 200 mL/hr over 60 Minutes Intravenous  Once 12/19/20 0042 12/19/20 0204   12/19/20 0200  vancomycin (VANCOREADY) IVPB 1500 mg/300 mL       See Hyperspace for full Linked Orders Report.   1,500 mg 150 mL/hr over 120 Minutes Intravenous  Once 12/19/20 0042 12/19/20 0453   12/19/20 0130  acyclovir (ZOVIRAX) 825 mg in dextrose 5 % 150 mL IVPB        10 mg/kg  82.3 kg (Adjusted) 166.5 mL/hr over 60 Minutes Intravenous Every 24 hours 12/19/20 0044     12/18/20 2330  acyclovir (ZOVIRAX) 825 mg in dextrose 5 % 150 mL IVPB  Status:  Discontinued        10 mg/kg  82.3 kg (Adjusted) 166.5 mL/hr  over 60 Minutes Intravenous Every 24 hours 12/18/20 2318 12/19/20 0044   12/18/20 2300  metroNIDAZOLE (FLAGYL) IVPB 500 mg  Status:  Discontinued        500 mg 100 mL/hr over 60 Minutes Intravenous Every 12 hours 12/18/20 2251 12/19/20 1115   12/18/20 2230  vancomycin (VANCOREADY) IVPB 1500 mg/300 mL  Status:  Discontinued       See Hyperspace for full Linked Orders Report.   1,500 mg 150 mL/hr over 120 Minutes Intravenous  Once 12/18/20 2123 12/19/20 0042   12/18/20 2130  ceFEPIme (MAXIPIME) 2 g in sodium chloride 0.9 % 100 mL IVPB        2 g 200 mL/hr over 30 Minutes Intravenous  Once 12/18/20 2117 12/19/20 0000   12/18/20 2130  metroNIDAZOLE (FLAGYL) IVPB 500 mg  Status:  Discontinued        500 mg 100 mL/hr over 60 Minutes Intravenous  Once 12/18/20 2117 12/18/20 2342   12/18/20 2130  vancomycin (VANCOCIN) IVPB 1000 mg/200 mL premix  Status:  Discontinued        1,000 mg 200 mL/hr over 60 Minutes Intravenous  Once 12/18/20 2117 12/18/20 2123   12/18/20 2130  vancomycin (VANCOCIN) IVPB 1000 mg/200 mL premix  Status:  Discontinued       See Hyperspace for full Linked Orders Report.   1,000 mg 200 mL/hr over 60 Minutes Intravenous  Once 12/18/20 2123 12/19/20 0042       Subjective: Today, patient was seen and examined at bedside.  Patient's daughter at bedside.  Patient denies any nausea vomiting fever chills.  Appears to be little more alert awake.  Still confused at times.  Complains of burning pain over the bilateral lower extremities.  Objective: Vitals:   12/19/20 1928 12/20/20 0355  BP: (!) 168/76 (!) 165/46  Pulse: 99 (!) 103  Resp: (!) 22 20  Temp: 99.5 F (37.5 C) 99.4 F (37.4 C)  SpO2: 99% 96%    Intake/Output Summary (Last 24 hours) at 12/20/2020 0756 Last data filed at 12/20/2020 0400 Gross per 24 hour  Intake 3039.56 ml  Output 750 ml  Net 2289.56 ml    Filed Weights   12/18/20 0751 12/18/20 0755 12/19/20 2056  Weight: 82 kg 113.4 kg 80.5 kg   Body  mass index is 27.8 kg/m.   Physical Exam:  GENERAL: Patient is alert awake and communicative, disoriented to place.   HENT: No scleral pallor or icterus. Pupils equally reactive to light. Oral mucosa is moist.  No obvious neck tenderness noted. NECK: is supple, no gross swelling noted. CHEST: Clear to auscultation. No crackles or wheezes.  Diminished breath sounds bilaterally. CVS: S1 and S2 heard, no murmur. Regular rate and rhythm.  ABDOMEN: Soft, non-tender, bowel sounds are present. EXTREMITIES: No edema. CNS: Cranial nerves are intact.  Moving extremities but generalized weakness noted. SKIN: warm and dry without rashes.  Previous history of shingles with a healed scar in the left arm and chest area.  Data Review: I have personally reviewed the following laboratory data and studies,  CBC: Recent Labs  Lab 12/18/20 0834 12/19/20 0616 12/20/20 0231  WBC 15.2* 19.2* 14.4*  HGB 11.0* 10.3* 9.2*  HCT 35.0* 33.3* 28.2*  MCV 86.4 84.1 82.5  PLT 331 337 063    Basic Metabolic Panel: Recent Labs  Lab 12/18/20 0834 12/19/20 0616 12/20/20 0231  NA 138 138 139  K 4.9 4.3 3.8  CL 110 106 103  CO2 18* 21* 27  GLUCOSE 219* 207* 108*  BUN 46* 46* 49*  CREATININE 4.11* 3.64* 3.59*  CALCIUM 9.0 8.8* 8.2*  MG  --   --  1.8  PHOS  --   --  3.7    Liver Function Tests: Recent Labs  Lab 12/18/20 0834 12/19/20 0616  AST 28 36  ALT 15 17  ALKPHOS 91 93  BILITOT 1.0 1.0  PROT 7.0 7.3  ALBUMIN 3.3* 2.9*    No results for input(s): LIPASE, AMYLASE in the last 168 hours. No results for input(s): AMMONIA in the last 168 hours. Cardiac Enzymes: Recent Labs  Lab 12/18/20 0834 12/20/20 0231  CKTOTAL 804* 286*    BNP (last 3 results) No results for input(s): BNP in the last 8760 hours.  ProBNP (last 3  results) No results for input(s): PROBNP in the last 8760 hours.  CBG: Recent Labs  Lab 12/19/20 1203 12/19/20 1622 12/19/20 2023 12/20/20 0003 12/20/20 0351   GLUCAP 154* 178* 141* 123* 106*    Recent Results (from the past 240 hour(s))  Resp Panel by RT-PCR (Flu A&B, Covid) Nasopharyngeal Swab     Status: None   Collection Time: 12/18/20  5:19 PM   Specimen: Nasopharyngeal Swab; Nasopharyngeal(NP) swabs in vial transport medium  Result Value Ref Range Status   SARS Coronavirus 2 by RT PCR NEGATIVE NEGATIVE Final    Comment: (NOTE) SARS-CoV-2 target nucleic acids are NOT DETECTED.  The SARS-CoV-2 RNA is generally detectable in upper respiratory specimens during the acute phase of infection. The lowest concentration of SARS-CoV-2 viral copies this assay can detect is 138 copies/mL. A negative result does not preclude SARS-Cov-2 infection and should not be used as the sole basis for treatment or other patient management decisions. A negative result may occur with  improper specimen collection/handling, submission of specimen other than nasopharyngeal swab, presence of viral mutation(s) within the areas targeted by this assay, and inadequate number of viral copies(<138 copies/mL). A negative result must be combined with clinical observations, patient history, and epidemiological information. The expected result is Negative.  Fact Sheet for Patients:  EntrepreneurPulse.com.au  Fact Sheet for Healthcare Providers:  IncredibleEmployment.be  This test is no t yet approved or cleared by the [place] FDA and  has been authorized for detection and/or diagnosis of SARS-CoV-2 by FDA under an Emergency Use Authorization (EUA). This EUA will remain  in effect (meaning this test can be used) for the duration of the COVID-19 declaration under Section 564(b)(1) of the Act, 21 U.S.C.section 360bbb-3(b)(1), unless the authorization is terminated  or revoked sooner.       Influenza A by PCR NEGATIVE NEGATIVE Final   Influenza B by PCR NEGATIVE NEGATIVE Final    Comment: (NOTE) The Xpert Xpress  SARS-CoV-2/FLU/RSV plus assay is intended as an aid in the diagnosis of influenza from Nasopharyngeal swab specimens and should not be used as a sole basis for treatment. Nasal washings and aspirates are unacceptable for Xpert Xpress SARS-CoV-2/FLU/RSV testing.  Fact Sheet for Patients: EntrepreneurPulse.com.au  Fact Sheet for Healthcare Providers: IncredibleEmployment.be  This test is not yet approved or cleared by the [place] FDA and has been authorized for detection and/or diagnosis of SARS-CoV-2 by FDA under an Emergency Use Authorization (EUA). This EUA will remain in effect (meaning this test can be used) for the duration of the COVID-19 declaration under Section 564(b)(1) of the Act, 21 U.S.C. section 360bbb-3(b)(1), unless the authorization is terminated or revoked.  Performed at [facility], [address] [place], [place] [address]   Urine Culture     Status: Abnormal   Collection Time: 12/18/20  5:30 PM   Specimen: Urine, Clean Catch  Result Value Ref Range Status   Specimen Description   Final    URINE, CLEAN CATCH Performed at [facility], [address], [place], [place] [address]    Special Requests   Final    NONE Performed at [facility], [address], [place], [place] [address]    Culture (A)  Final    <10,000 COLONIES/mL INSIGNIFICANT GROWTH Performed at [facility] [address], [place], [place] [address]    Report Status 12/19/2020 FINAL  Final  Blood culture (routine x 2)     Status: None (Preliminary result)  Collection Time: 12/18/20 10:30 PM   Specimen: BLOOD  Result Value Ref Range Status   Specimen Description BLOOD BLOOD RIGHT HAND  Final   Special Requests   Final    BOTTLES DRAWN AEROBIC AND ANAEROBIC Blood Culture adequate volume   Culture   Final    NO GROWTH 2 DAYS Performed at [facility], [address] [place], [place] [address]     Report Status PENDING  Incomplete  Blood culture (routine x 2)     Status: None (Preliminary result)   Collection Time: 12/18/20 10:35 PM   Specimen: BLOOD  Result Value Ref Range Status   Specimen Description BLOOD BLOOD LEFT HAND  Final   Special Requests   Final    BOTTLES DRAWN AEROBIC AND ANAEROBIC Blood Culture adequate volume   Culture   Final    NO GROWTH 2 DAYS Performed at [facility], [address] [place], [place] [address]    Report Status PENDING  Incomplete      Studies: DG Chest 1 View  Result Date: 12/18/2020 CLINICAL DATA:  Weakness, lower extremity pain EXAM: CHEST  1 VIEW COMPARISON:  11/26/2020 FINDINGS: Single frontal view of the chest demonstrates a stable cardiac silhouette. Stable atherosclerosis. No acute airspace disease, effusion, or pneumothorax. No acute bony abnormality. IMPRESSION: 1. No acute intrathoracic process. Electronically Signed   By: Randa Ngo M.D.   On: 12/18/2020 15:37   DG Lumbar Spine 2-3 Views  Result Date: 12/18/2020 CLINICAL DATA:  Weakness, pain EXAM: LUMBAR SPINE - 2-3 VIEW COMPARISON:  None. FINDINGS: Degenerative spurring throughout the lumbar spine. Mild disc space narrowing at L5-S1. Degenerative facet disease. Normal alignment. No fracture. Aortic atherosclerosis. Left renal artery and iliac artery stents bilaterally. IMPRESSION: Degenerative changes.  No acute bony abnormality. Electronically Signed   By: Rolm Baptise M.D.   On: 12/18/2020 15:38   DG Pelvis 1-2 Views  Result Date: 12/18/2020 CLINICAL DATA:  Pain, weakness EXAM: PELVIS - 1-2 VIEW COMPARISON:  None. FINDINGS: Early spurring in the hip joints bilaterally. No acute bony abnormality. Specifically, no fracture, subluxation, or dislocation. IMPRESSION: No acute bony abnormality. Electronically Signed   By: Rolm Baptise M.D.   On: 12/18/2020 15:37   MR LUMBAR SPINE WO CONTRAST  Result Date: 12/18/2020 CLINICAL DATA:  Lumbar radiculopathy. Rule out  infection. Low back pain EXAM: MRI LUMBAR SPINE WITHOUT CONTRAST TECHNIQUE: Multiplanar, multisequence MR imaging of the lumbar spine was performed. No intravenous contrast was administered. COMPARISON:  Lumbar spine radiographs 12/18/2020 FINDINGS: Segmentation:  5 lumbar segments as noted on x-ray. Alignment: Slight anterolisthesis L4-5. Otherwise normal alignment. Vertebrae: Negative for fracture or mass. No evidence of spinal infection. Conus medullaris and cauda equina: Conus extends to the L1-2 level. Conus and cauda equina appear normal. Paraspinal and other soft tissues: Negative for paraspinous mass or adenopathy. Disc levels: L1-2: Negative L2-3: Mild disc bulging.  Negative for stenosis L3-4: Diffuse bulging of the disc. Small right foraminal disc protrusion. Bilateral facet degeneration. Mild subarticular stenosis bilaterally. Spinal canal adequate in size L4-5: Mild anterolisthesis. Diffuse disc bulging and bilateral facet degeneration. Mild spinal stenosis. Neural foramina patent bilaterally. L5-S1: Disc degeneration with diffuse endplate spurring. Moderate subarticular stenosis bilaterally. IMPRESSION: 1. Mild lumbar degenerative change.  Mild spinal stenosis as above 2. No evidence of spinal infection or fracture. Electronically Signed   By: Franchot Gallo M.D.   On: 12/18/2020 20:06      Flora Lipps, MD  [facility] 12/20/2020  If  7PM-7AM, please contact night-coverage

## 2020-12-20 NOTE — Procedures (Signed)
PROCEDURE SUMMARY:  Successful fluoroscopic guided lumbar puncture. Opening pressure 17cm of H20. Yielded 10 mL of colorless CSF fluid. Pt tolerated procedure well. No immediate complications.  Specimen was sent for labs.  EBL n/a mL  Hedy Jacob PA-C 12/20/2020 10:03 AM

## 2020-12-20 NOTE — Consult Note (Addendum)
NAME: [name]  DOB: Dec 08, 1933  MRN: [id number]  Date/Time: 12/20/2020 6:06 PM  REQUESTING PROVIDER: Dr. Louanne Belton Subjective:  REASON FOR CONSULT: Rule out meningitis ? Jennifer Carrillo is a 87 y.o. female with a history of Hypertension, CA breast on anastrozole, hyperlipidemia, OSA, discoid lupus presents with acute onset of pain in her right buttock and leg.  And inability to walk for the past few days. Patient had seen her PCP on 11/27/2020 for rash under the and along the back along the left breast and was diagnosed with herpes zoster and treated with Valtrex. On questioning patient she has pain in the right wrist, pain on her feet and right knee, right buttock.  She has had some difficulty in walking because of this acute onset of pain. She does say though that she has had chronic pain for months but acute onset is 1 day for the past few days. At home she denied any fever No cough or shortness of breath No diarrhea No skin lesions  In the ED vitals on 02/19/2020 was BP of 221/82, temperature of 101.8, heart rate 109 and sats of 97%. WBC 15.2, Hb 11, platelet 331 and creatinine 4.11 Blood culture was sent on 12/18/2020 and it was negative so far.  MRI of the lumbar sacral spine was done and that showed mild lumbar degenerative changes.  No evidence of spinal infection or fracture. Chest x-ray done on 02/19/2020 did not show any abnormalities.  As there was some confusion manifested patient underwent lumbar puncture.  Prior to that she was started on 3 antibiotics ampicillin, Vanco and ceftriaxone as well as acyclovir.  I am asked to see the patient for ruling out meningitis. On examination patient is awake and alert and able to answer appropriately all my questions CSF showed WBC of 5 and RBC 0 and protein of 60 and glucose of 94. I called the lab and asked them to do WBC and tube 1 which was 3.  Past Medical History:  Diagnosis Date   Arthritis    Asthma    Calf pain     Cancer ([facility]) 2016   left breast   COPD (chronic obstructive pulmonary disease) (HCC)    Diabetes (HCC)    Discoid lupus    Gastro-esophageal reflux    Glaucoma    Hyperlipemia    Hypertension    Iron deficiency anemia    Joint pain    Leg swelling    Osteoarthritis    PVD (peripheral vascular disease) ([facility])    Sinus problem    Sleep apnea    No CPAP/ Can't tolerate   Stroke ([facility]) 1987    Past Surgical History:  Procedure Laterality Date   BREAST SURGERY Left    left lumpectomy   CAROTID ANGIOGRAPHY Left 06/25/2018   Procedure: CAROTID ANGIOGRAPHY, possible intervention;  Surgeon: Katha Cabal, MD;  Location: [facility];  Service: Cardiovascular;  Laterality: Left;   CAROTID ENDARTERECTOMY Right    CAROTID PTA/STENT INTERVENTION Left 06/25/2018   Procedure: CAROTID PTA/STENT INTERVENTION;  Surgeon: Katha Cabal, MD;  Location: [facility];  Service: Cardiovascular;  Laterality: Left;   CATARACT EXTRACTION Right 2013   CORONARY STENT PLACEMENT     L. L. E.   EYE SURGERY Bilateral    cataract   LEFT HEART CATH AND CORONARY ANGIOGRAPHY Left 05/06/2018   Procedure: LEFT HEART CATH AND CORONARY ANGIOGRAPHY;  Surgeon: Corey Skains, MD;  Location: [facility];  Service: Cardiovascular;  Laterality: Left;   LOWER EXTREMITY ANGIOGRAPHY Right 01/22/2018   Procedure: LOWER EXTREMITY ANGIOGRAPHY;  Surgeon: Katha Cabal, MD;  Location: [facility];  Service: Cardiovascular;  Laterality: Right;   RENAL ANGIOGRAPHY Left 02/26/2018   Procedure: RENAL ANGIOGRAPHY;  Surgeon: Katha Cabal, MD;  Location: [facility];  Service: Cardiovascular;  Laterality: Left;   STENT PLACEMENT VASCULAR ([facility]) Left    stent placement on LLE   TUBAL LIGATION      Social History   Socioeconomic History   Marital status: Married    Spouse name: Not on file   Number of children: Not on file   Years of education: Not on file   Highest  education level: Not on file  Occupational History   Not on file  Tobacco Use   Smoking status: Former    Types: Cigarettes    Quit date: 82    Years since quitting: 35.9   Smokeless tobacco: Never   Tobacco comments:    quit 31 years  Vaping Use   Vaping Use: Never used  Substance and Sexual Activity   Alcohol use: No   Drug use: No   Sexual activity: Not on file  Other Topics Concern   Not on file  Social History Narrative   Walks with a rolling walker; remote hx of smoking; no alcohol; [place]- with husband/son. Daughter lives in [place].    Social Determinants of Health   Financial Resource Strain: Low Risk    Difficulty of Paying Living Expenses: Not very hard  Food Insecurity: Not on file  Transportation Needs: Not on file  Physical Activity: Not on file  Stress: Not on file  Social Connections: Not on file  Intimate Partner Violence: Not on file    Family History  Problem Relation Age of Onset   Heart disease Mother    Diabetes Other    Hypertension Other    Diabetes Sister    Lung cancer Brother    Leukemia Daughter    Bladder Cancer Neg Hx    Kidney disease Neg Hx    Prostate cancer Neg Hx    Allergies  Allergen Reactions   Benazepril Nausea Only   I? Current Facility-Administered Medications  Medication Dose Route Frequency Provider Last Rate Last Admin   acetaminophen (TYLENOL) tablet 650 mg  650 mg Oral Q6H PRN Athena Masse, MD   650 mg at 12/20/20 1611   Or   acetaminophen (TYLENOL) suppository 650 mg  650 mg Rectal Q6H PRN Athena Masse, MD       acyclovir (ZOVIRAX) 825 mg in dextrose 5 % 150 mL IVPB  10 mg/kg (Adjusted) Intravenous Q24H Judd Gaudier V, MD 166.5 mL/hr at 12/20/20 0034 825 mg at 12/20/20 0034   amLODipine (NORVASC) tablet 10 mg  10 mg Oral Daily Athena Masse, MD   10 mg at 12/20/20 1034   anastrozole (ARIMIDEX) tablet 1 mg  1 mg Oral QPC supper Pokhrel, Corrie Mckusick, MD       Derrill Memo ON 12/21/2020] cefTRIAXone (ROCEPHIN)  2 g in sodium chloride 0.9 % 100 mL IVPB  2 g Intravenous Q24H Emmry Hinsch, Joellyn Quails, MD       digoxin (LANOXIN) tablet 0.0625 mg  0.0625 mg Oral Q48H Pokhrel, Laxman, MD   0.0625 mg at 12/19/20 1225   docusate sodium (COLACE) capsule 200 mg  200 mg Oral QHS PRN Pokhrel, Laxman, MD       donepezil (ARICEPT) tablet  10 mg  10 mg Oral QHS Pokhrel, Laxman, MD       hydrALAZINE (APRESOLINE) injection 10 mg  10 mg Intravenous Q4H PRN Athena Masse, MD   10 mg at 12/19/20 1757   hydrALAZINE (APRESOLINE) tablet 100 mg  100 mg Oral Q8H Pokhrel, Laxman, MD   100 mg at 12/20/20 1255   insulin aspart (novoLOG) injection 0-15 Units  0-15 Units Subcutaneous Q4H Athena Masse, MD   2 Units at 12/20/20 1254   isosorbide mononitrate (IMDUR) 24 hr tablet 60 mg  60 mg Oral Daily Pokhrel, Laxman, MD   60 mg at 12/20/20 1034   labetalol (NORMODYNE) injection 20 mg  20 mg Intravenous Q2H PRN Pokhrel, Laxman, MD       mometasone-formoterol (DULERA) 100-5 MCG/ACT inhaler 2 puff  2 puff Inhalation BID Pokhrel, Laxman, MD   2 puff at 12/20/20 0822   ondansetron (ZOFRAN) tablet 4 mg  4 mg Oral Q6H PRN Athena Masse, MD       Or   ondansetron [facility]) injection 4 mg  4 mg Intravenous Q6H PRN Athena Masse, MD       [START ON 12/21/2020] ranolazine (RANEXA) 12 hr tablet 500 mg  500 mg Oral Daily Dallie Piles, RPH       sodium bicarbonate 150 mEq in sterile water 1,150 mL infusion   Intravenous Continuous Pokhrel, [place], MD 100 mL/hr at 12/20/20 1218 New Bag at 12/20/20 1218     Abtx:  Anti-infectives (From admission, onward)    Start     Dose/Rate Route Frequency Ordered Stop   12/21/20 1222  cefTRIAXone (ROCEPHIN) 2 g in sodium chloride 0.9 % 100 mL IVPB        2 g 200 mL/hr over 30 Minutes Intravenous Every 24 hours 12/20/20 1805     12/21/20 0200  vancomycin (VANCOREADY) IVPB 750 mg/150 mL  Status:  Discontinued        750 mg 150 mL/hr over 60 Minutes Intravenous Every 48 hours 12/18/20 2312 12/20/20  1416   12/20/20 2200  vancomycin (VANCOREADY) IVPB 750 mg/150 mL  Status:  Discontinued        750 mg 150 mL/hr over 60 Minutes Intravenous Every 48 hours 12/20/20 1416 12/20/20 1805   12/19/20 2200  ceFEPIme (MAXIPIME) 2 g in sodium chloride 0.9 % 100 mL IVPB  Status:  Discontinued        2 g 200 mL/hr over 30 Minutes Intravenous Every 24 hours 12/18/20 2309 12/19/20 1114   12/19/20 2200  cefTRIAXone (ROCEPHIN) 2 g in sodium chloride 0.9 % 100 mL IVPB  Status:  Discontinued        2 g 200 mL/hr over 30 Minutes Intravenous Every 12 hours 12/19/20 1114 12/20/20 1805   12/19/20 1130  ampicillin (OMNIPEN) 2 g in sodium chloride 0.9 % 100 mL IVPB  Status:  Discontinued        2 g 300 mL/hr over 20 Minutes Intravenous Every 12 hours 12/19/20 1116 12/20/20 1805   12/19/20 0200  vancomycin (VANCOCIN) IVPB 1000 mg/200 mL premix       See Hyperspace for full Linked Orders Report.   1,000 mg 200 mL/hr over 60 Minutes Intravenous  Once 12/19/20 0042 12/19/20 0204   12/19/20 0200  vancomycin (VANCOREADY) IVPB 1500 mg/300 mL       See Hyperspace for full Linked Orders Report.   1,500 mg 150 mL/hr over 120 Minutes Intravenous  Once 12/19/20 0042 12/19/20 0453  12/19/20 0130  acyclovir (ZOVIRAX) 825 mg in dextrose 5 % 150 mL IVPB        10 mg/kg  82.3 kg (Adjusted) 166.5 mL/hr over 60 Minutes Intravenous Every 24 hours 12/19/20 0044     12/18/20 2330  acyclovir (ZOVIRAX) 825 mg in dextrose 5 % 150 mL IVPB  Status:  Discontinued        10 mg/kg  82.3 kg (Adjusted) 166.5 mL/hr over 60 Minutes Intravenous Every 24 hours 12/18/20 2318 12/19/20 0044   12/18/20 2300  metroNIDAZOLE (FLAGYL) IVPB 500 mg  Status:  Discontinued        500 mg 100 mL/hr over 60 Minutes Intravenous Every 12 hours 12/18/20 2251 12/19/20 1115   12/18/20 2230  vancomycin (VANCOREADY) IVPB 1500 mg/300 mL  Status:  Discontinued       See Hyperspace for full Linked Orders Report.   1,500 mg 150 mL/hr over 120 Minutes Intravenous   Once 12/18/20 2123 12/19/20 0042   12/18/20 2130  ceFEPIme (MAXIPIME) 2 g in sodium chloride 0.9 % 100 mL IVPB        2 g 200 mL/hr over 30 Minutes Intravenous  Once 12/18/20 2117 12/19/20 0000   12/18/20 2130  metroNIDAZOLE (FLAGYL) IVPB 500 mg  Status:  Discontinued        500 mg 100 mL/hr over 60 Minutes Intravenous  Once 12/18/20 2117 12/18/20 2342   12/18/20 2130  vancomycin (VANCOCIN) IVPB 1000 mg/200 mL premix  Status:  Discontinued        1,000 mg 200 mL/hr over 60 Minutes Intravenous  Once 12/18/20 2117 12/18/20 2123   12/18/20 2130  vancomycin (VANCOCIN) IVPB 1000 mg/200 mL premix  Status:  Discontinued       See Hyperspace for full Linked Orders Report.   1,000 mg 200 mL/hr over 60 Minutes Intravenous  Once 12/18/20 2123 12/19/20 0042       REVIEW OF SYSTEMS:  Const: negative fever, negative chills, negative weight loss Eyes: negative diplopia or visual changes, negative eye pain ENT: negative coryza, negative sore throat Resp: negative cough, hemoptysis, dyspnea Cards: negative for chest pain, palpitations, lower extremity edema GU: negative for frequency, dysuria and hematuria GI: Negative for abdominal pain, diarrhea, bleeding, constipation Skin: negative for rash and pruritus Heme: negative for easy bruising and gum/nose bleeding MS: Pain in multiple joints with severe tenderness and inability to walk. Neurolo:negative for headaches, dizziness, vertigo, memory problems  Psych: negative for feelings of anxiety, depression  Endocrine: negative for thyroid, diabetes Allergy/Immunology- negative for any medication or food allergies ? Pertinent Positives include : Objective:  VITALS:  BP (!) 146/45    Pulse 98    Temp (!) 100.7 F (38.2 C)    Resp 16    Ht 5\' 7"  (1.702 m)    Wt 80.5 kg    SpO2 97%    BMI 27.80 kg/m  PHYSICAL EXAM:  General: Alert, cooperative, some distress, appears stated age.  Oriented in place and person.  And year Head: Normocephalic, without  obvious abnormality, atraumatic. Eyes: Conjunctivae clear, anicteric sclerae. Pupils are equal ENT Nares normal. No drainage or sinus tenderness. Lips, mucosa, and tongue normal. No Thrush Neck: symmetrical, no adenopathy, thyroid: non tender no carotid bruit and no JVD. Back: Severe tenderness over the right ischial tuberosity. Lungs: Clear to auscultation bilaterally. No Wheezing or Rhonchi. No rales. Heart: Regular rate and rhythm, no murmur, rub or gallop. Abdomen: Soft, non-tender,not distended. Bowel sounds normal. No masses Extremities: Right wrist  swollen and very tender   Right knee is swollen and tender  Tenderness on palpation of the joints of the feet skin: No rashes or lesions. Or bruising Lymph: Cervical, supraclavicular normal. Neurologic: Grossly non-focal Pertinent Labs Lab Results CBC    Component Value Date/Time   WBC 14.4 (H) 12/20/2020 0231   RBC 3.42 (L) 12/20/2020 0231   HGB 9.2 (L) 12/20/2020 0231   HGB CANCELED 08/12/2020 1032   HCT 28.2 (L) 12/20/2020 0231   HCT CANCELED 08/12/2020 1032   PLT 292 12/20/2020 0231   PLT CANCELED 08/12/2020 1032   MCV 82.5 12/20/2020 0231   MCV CANCELED 08/12/2020 1032   MCV 78 (L) 05/20/2013 0509   MCH 26.9 12/20/2020 0231   MCHC 32.6 12/20/2020 0231   RDW 17.4 (H) 12/20/2020 0231   RDW CANCELED 08/12/2020 1032   RDW 16.8 (H) 05/20/2013 0509   LYMPHSABS 1.6 11/09/2020 1234   LYMPHSABS CANCELED 08/12/2020 1032   LYMPHSABS 3.9 (H) 05/20/2013 0509   MONOABS 0.5 11/09/2020 1234   MONOABS 1.0 (H) 05/20/2013 0509   EOSABS 0.1 11/09/2020 1234   EOSABS CANCELED 08/12/2020 1032   EOSABS 0.2 05/20/2013 0509   BASOSABS 0.0 11/09/2020 1234   BASOSABS CANCELED 08/12/2020 1032   BASOSABS 0.1 05/20/2013 0509    CMP Latest Ref Rng & Units 12/20/2020 12/19/2020 12/18/2020  Glucose 70 - 99 mg/dL 108(H) 207(H) 219(H)  BUN 8 - 23 mg/dL 49(H) 46(H) 46(H)  Creatinine 0.44 - 1.00 mg/dL 3.59(H) 3.64(H) 4.11(H)  Sodium 135 - 145  mmol/L 139 138 138  Potassium 3.5 - 5.1 mmol/L 3.8 4.3 4.9  Chloride 98 - 111 mmol/L 103 106 110  CO2 22 - 32 mmol/L 27 21(L) 18(L)  Calcium 8.9 - 10.3 mg/dL 8.2(L) 8.8(L) 9.0  Total Protein 6.5 - 8.1 g/dL - 7.3 7.0  Total Bilirubin 0.3 - 1.2 mg/dL - 1.0 1.0  Alkaline Phos 38 - 126 U/L - 93 91  AST 15 - 41 U/L - 36 28  ALT 0 - 44 U/L - 17 15      Microbiology: Recent Results (from the past 240 hour(s))  Resp Panel by RT-PCR (Flu A&B, Covid) Nasopharyngeal Swab     Status: None   Collection Time: 12/18/20  5:19 PM   Specimen: Nasopharyngeal Swab; Nasopharyngeal(NP) swabs in vial transport medium  Result Value Ref Range Status   SARS Coronavirus 2 by RT PCR NEGATIVE NEGATIVE Final    Comment: (NOTE) SARS-CoV-2 target nucleic acids are NOT DETECTED.  The SARS-CoV-2 RNA is generally detectable in upper respiratory specimens during the acute phase of infection. The lowest concentration of SARS-CoV-2 viral copies this assay can detect is 138 copies/mL. A negative result does not preclude SARS-Cov-2 infection and should not be used as the sole basis for treatment or other patient management decisions. A negative result may occur with  improper specimen collection/handling, submission of specimen other than nasopharyngeal swab, presence of viral mutation(s) within the areas targeted by this assay, and inadequate number of viral copies(<138 copies/mL). A negative result must be combined with clinical observations, patient history, and epidemiological information. The expected result is Negative.  Fact Sheet for Patients:  EntrepreneurPulse.com.au  Fact Sheet for Healthcare Providers:  IncredibleEmployment.be  This test is no t yet approved or cleared by the [place] FDA and  has been authorized for detection and/or diagnosis of SARS-CoV-2 by FDA under an Emergency Use Authorization (EUA). This EUA will remain  in effect (meaning this test  can be used)  for the duration of the COVID-19 declaration under Section 564(b)(1) of the Act, 21 U.S.C.section 360bbb-3(b)(1), unless the authorization is terminated  or revoked sooner.       Influenza A by PCR NEGATIVE NEGATIVE Final   Influenza B by PCR NEGATIVE NEGATIVE Final    Comment: (NOTE) The Xpert Xpress SARS-CoV-2/FLU/RSV plus assay is intended as an aid in the diagnosis of influenza from Nasopharyngeal swab specimens and should not be used as a sole basis for treatment. Nasal washings and aspirates are unacceptable for Xpert Xpress SARS-CoV-2/FLU/RSV testing.  Fact Sheet for Patients: EntrepreneurPulse.com.au  Fact Sheet for Healthcare Providers: IncredibleEmployment.be  This test is not yet approved or cleared by the [place] FDA and has been authorized for detection and/or diagnosis of SARS-CoV-2 by FDA under an Emergency Use Authorization (EUA). This EUA will remain in effect (meaning this test can be used) for the duration of the COVID-19 declaration under Section 564(b)(1) of the Act, 21 U.S.C. section 360bbb-3(b)(1), unless the authorization is terminated or revoked.  Performed at [facility], [address] [place], [place] [address]   Urine Culture     Status: Abnormal   Collection Time: 12/18/20  5:30 PM   Specimen: Urine, Clean Catch  Result Value Ref Range Status   Specimen Description   Final    URINE, CLEAN CATCH Performed at [facility], [address], [place], [place] [address]    Special Requests   Final    NONE Performed at [facility], [address] [place], [place] [address]    Culture (A)  Final    <10,000 COLONIES/mL INSIGNIFICANT GROWTH Performed at [facility] [address] [place], [place] [address]    Report Status 12/19/2020 FINAL  Final  Blood culture (routine x 2)     Status: None (Preliminary result)   Collection Time: 12/18/20 10:30 PM    Specimen: BLOOD  Result Value Ref Range Status   Specimen Description BLOOD BLOOD RIGHT HAND  Final   Special Requests   Final    BOTTLES DRAWN AEROBIC AND ANAEROBIC Blood Culture adequate volume   Culture   Final    NO GROWTH 2 DAYS Performed at [facility], [address], [place], [place] [address]    Report Status PENDING  Incomplete  Blood culture (routine x 2)     Status: None (Preliminary result)   Collection Time: 12/18/20 10:35 PM   Specimen: BLOOD  Result Value Ref Range Status   Specimen Description BLOOD BLOOD LEFT HAND  Final   Special Requests   Final    BOTTLES DRAWN AEROBIC AND ANAEROBIC Blood Culture adequate volume   Culture   Final    NO GROWTH 2 DAYS Performed at [facility], [address], [place], [place] [address]    Report Status PENDING  Incomplete  CSF culture w Gram Stain     Status: None (Preliminary result)   Collection Time: 12/20/20  9:45 AM   Specimen: PATH Cytology CSF; Cerebrospinal Fluid  Result Value Ref Range Status   Specimen Description CSF  Final   Special Requests NONE  Final   Gram Stain   Final    WBC SEEN NO RBC SEEN NO ORGANISMS SEEN Performed at [facility], [address] [place],  [address]    Culture PENDING  Incomplete   Report Status PENDING  Incomplete    IMAGING RESULTS: I have personally reviewed the films ? Impression/Recommendation ? Acute asymmetrical  polyarthritis with swelling, tenderness and inability to walk   Inflammatory polyarthritis Likely is a noninfectious cause Rule out CPPD versus gout Less likely to be infection.  But currently will cover with ceftriaxone.  Will recommend rheumatology consult and aspiration of the joint to send  fluid for crystals, cell count and culture   Hypertensive emergency BP was 233/89 on presentation.  Improved with medication.  Wonder whether the confusion was secondary to the hypertensive encephalopathy. Patient does not have  an MRI or CAT scan of the head recommend one   AKI on  CKD  Anemia  CSF normal.   very minimally elevated protein.  Not significant.  She does not have any evidence clinically and by CSF of meningitis or encephalitis. We will discontinue ampicillin, vancomycin, and acyclovir.  Recently treated herpes zoster on the left side under the breast.  The lesions are all healed.   History of left breast CA on anastrozole   ___________________________________________________ Discussed with patient, requesting provider Note:  This document was prepared using Dragon voice recognition software and may include unintentional dictation errors.

## 2020-12-20 NOTE — Progress Notes (Signed)
PHARMACY NOTE:  RENAL DOSAGE ADJUSTMENT  Current regimen includes a mismatch between  dosage and estimated renal function.  As per policy approved by the Pharmacy & Therapeutics and Medical Executive Committees, the dosage will be adjusted accordingly.  Current ranolazine dosage:  500 mg po twice daily  Renal Function:  Estimated Creatinine Clearance: 12.5 mL/min (A) (by C-G formula based on SCr of 3.59 mg/dL (H)). []      On intermittent HD, scheduled: []      On CRRT    ranolazine dosage has been changed to:  500 mg po once daily   Thank you for allowing pharmacy to be a part of this patient's care.  Dallie Piles, [facility] 12/20/2020 10:43 AM

## 2020-12-21 ENCOUNTER — Inpatient Hospital Stay: Payer: Medicare HMO

## 2020-12-21 LAB — GLUCOSE, CAPILLARY
Glucose-Capillary: 106 mg/dL — ABNORMAL HIGH (ref 70–99)
Glucose-Capillary: 100 mg/dL — ABNORMAL HIGH (ref 70–99)
Glucose-Capillary: 141 mg/dL — ABNORMAL HIGH (ref 70–99)
Glucose-Capillary: 120 mg/dL — ABNORMAL HIGH (ref 70–99)
Glucose-Capillary: 133 mg/dL — ABNORMAL HIGH (ref 70–99)

## 2020-12-21 LAB — CREATININE, SERUM
Creatinine, Ser: 4.28 mg/dL — ABNORMAL HIGH (ref 0.44–1.00)
GFR, Estimated: 10 mL/min — ABNORMAL LOW (ref >60)

## 2020-12-21 LAB — SYNOVIAL CELL COUNT + DIFF, W/ CRYSTALS
Eosinophils-Synovial: 0 % (ref 0–1)
Lymphocytes-Synovial Fld: 5 % (ref 0–20)
Monocyte-Macrophage-Synovial Fluid: 1 % — ABNORMAL LOW (ref 50–90)
Neutrophil, Synovial: 94 % — ABNORMAL HIGH (ref 0–25)
Other Cells-SYN: 0
WBC, Synovial: 19460 /mm3 — ABNORMAL HIGH (ref 0–200)

## 2020-12-21 LAB — HSV 1/2 PCR, CSF
HSV-1 DNA: NEGATIVE
HSV-2 DNA: NEGATIVE

## 2020-12-21 LAB — C-REACTIVE PROTEIN: CRP: 24.9 mg/dL — ABNORMAL HIGH (ref <1.0)

## 2020-12-21 LAB — URIC ACID: Uric Acid, Serum: 7.5 mg/dL — ABNORMAL HIGH (ref 2.5–7.1)

## 2020-12-21 LAB — VZV PCR, CSF: VZV PCR, CSF: NEGATIVE

## 2020-12-21 LAB — SEDIMENTATION RATE: Sed Rate: 126 mm/hr — ABNORMAL HIGH (ref 0–30)

## 2020-12-21 NOTE — Progress Notes (Signed)
Date of Admission:  12/18/2020   l   ID: Jennifer Carrillo is a 87 y.o. female    Principal Problem:   Sepsis ([place]) Active Problems:   PAD (peripheral artery disease) ([place])   Coronary artery disease involving native coronary artery of native heart   Anemia of chronic kidney failure, stage 4 (severe) (HCC)   Acute kidney injury superimposed on CKD IV (HCC)   Metabolic acidosis   Rhabdomyolysis   Hypertensive emergency without congestive heart failure   Hyperglycemia due to type 2 diabetes mellitus ([place])   Shingles 12/05/2020    Subjective: Pt says she has pain rt wrist, rt knee, b/l feet, rt buttock pain. She is getting tylenol which has some what made it better No fever No preceding diarrhea or urinary symptoms    Medications:   amLODipine  10 mg Oral Daily   anastrozole  1 mg Oral QPC supper   digoxin  0.0625 mg Oral Q48H   donepezil  10 mg Oral QHS   hydrALAZINE  100 mg Oral Q8H   insulin aspart  0-15 Units Subcutaneous Q4H   isosorbide mononitrate  60 mg Oral Daily   mometasone-formoterol  2 puff Inhalation BID   ranolazine  500 mg Oral Daily    Objective: Vital signs in last 24 hours: Temp:  [98.6 F (37 C)-100.7 F (38.2 C)] 99.2 F (37.3 C) (12/14 0736) Pulse Rate:  [88-100] 91 (12/14 0736) Resp:  [16-20] 16 (12/14 0736) BP: (140-174)/(40-56) 163/56 (12/14 0736) SpO2:  [95 %-100 %] 95 % (12/14 0736)  PHYSICAL EXAM:  General: Alert, cooperative, some  distress on moving , appears stated age. Oriented x 4 Head: Normocephalic, without obvious abnormality, atraumatic. Eyes: Conjunctivae clear, anicteric sclerae. Pupils are equal ENT Nares normal. No drainage or sinus tenderness. Lips, mucosa, and tongue normal. No Thrush Neck: symmetrical, no adenopathy, thyroid: non tender no carotid bruit and no JVD. Back: No CVA tenderness. Lungs: Clear to auscultation bilaterally. No Wheezing or Rhonchi. No rales. Heart: Regular rate and rhythm, no murmur, rub or  gallop. Abdomen: Soft, non-tender,not distended. Bowel sounds normal. No masses Extremities: rt wrist swollen and tender, rt knee swollen and tender- rt ischial tuberosity severe tenderness Tenderness on squeezing her feet Skin: No rashes or lesions. Or bruising Lymph: Cervical, supraclavicular normal. Neurologic: Grossly non-focal  Lab Results Recent Labs    12/19/20 0616 12/20/20 0231 12/21/20 0241  WBC 19.2* 14.4*  --   HGB 10.3* 9.2*  --   HCT 33.3* 28.2*  --   NA 138 139  --   K 4.3 3.8  --   CL 106 103  --   CO2 21* 27  --   BUN 46* 49*  --   CREATININE 3.64* 3.59* 4.28*   Liver Panel Recent Labs    12/19/20 0616  PROT 7.3  ALBUMIN 2.9*  AST 36  ALT 17  ALKPHOS 6  BILITOT 1.0  Microbiology: BC - NG CSF culture NG Studies/Results: DG FL GUIDED LUMBAR PUNCTURE  Result Date: 12/20/2020 CLINICAL DATA:  Patient complains of back pain with concern for infection request received for diagnostic lumbar puncture. EXAM: DIAGNOSTIC LUMBAR PUNCTURE UNDER FLUOROSCOPIC GUIDANCE COMPARISON:  MR lumbar spine 12/18/2020, lumbar spine x-rays 12/18/2020 reviewed prior to the procedure. FLUOROSCOPY TIME:  Fluoroscopy Time:  0.2 minute Radiation Exposure Index (if provided by the fluoroscopic device): 6.60 mGy Number of Acquired Spot Images: 2 PROCEDURE: Informed consent was obtained from the patient's legal guardian prior to the procedure,  including potential complications of bleeding, infection, paresthesias, nerve damage, CSF leak requiring additional procedures, post procedure requirement to lay flat for several hours after the procedure, headache, allergy, and pain. With the patient prone, the lower back was prepped with Betadine. 1% Lidocaine was used for local anesthesia. Lumbar puncture was performed at the L4-L5 level using a 20 gauge needle with return of colorless CSF with an opening pressure of 17 cm water. 10 ml of CSF were obtained for laboratory studies. The inner stylet was  placed back into the needle and the needle was removed in its entirety. The patient tolerated the procedure well and there were no apparent complications. A sterile bandage was applied. IMPRESSION: Technically successful fluoroscopic guided lumbar puncture. This exam was performed by Tsosie Billing PA-C, and was supervised and interpreted by Dr. Posey Pronto. Electronically Signed   By: Kathreen Devoid M.D.   On: 12/20/2020 10:25     Assessment/Plan: Acute asymmetrical polyarthritis with swelling, tenderness and inability to walk  Encephalopathy- has resolved completely-  CSF normal.   very minimally elevated protein.  Not significant.  She does not have any evidence clinically and by CSF of meningitis or encephalitis. all antibiotics stopped except ceftriaxone, which would be stopped if blood culture remains neg tomorrow  Inflammatory polyarthritis-- r/o CPPD/ gout/seronegative arthritis Need rheum consult Possible aspiration of the rt knee joint for crystals, cell count and culture  Hypertensive emergency- BP better controlled  AKI on CKD- worsening Recently treated herpes zoster on the left side under the breast.  The lesions are all healed.     History of left breast CA on anastrozole  Anemia   Discussed the management with patient, her family at bed side and with the hospitalist

## 2020-12-21 NOTE — Care Management Important Message (Signed)
Important Message  Patient Details  Name: Jennifer Carrillo MRN: [id number] Date of Birth: July 15, 1933   Medicare Important Message Given:  Yes     Dannette Barbara 12/21/2020, 11:47 AM

## 2020-12-21 NOTE — TOC Progression Note (Addendum)
Transition of Care [facility]) - Progression Note    Patient Details  Name: Jennifer Carrillo MRN: [id number] Date of Birth: [birth year]  Transition of Care [facility]) CM/SW A[place], LCSW Phone Number: 12/21/2020, 10:45 AM  Clinical Narrative:   Daughter agreeable to SNF placement. She will research local facilities and follow up with preference tomorrow. CSW sent out referral.   4:12 pm: No bed offers so far. [facility], [facility], and [facility] are still pending.  Expected Discharge Plan: [place] Barriers to Discharge: Continued Medical Work up  Expected Discharge Plan and Services Expected Discharge Plan: [place]   Discharge Planning Services: CM Consult Post Acute Care Choice: [place] arrangements for the past 2 months: Single Family Home                 DME Arranged: N/A DME Agency: NA                   Social Determinants of Health (SDOH) Interventions    Readmission Risk Interventions Readmission Risk Prevention Plan 12/19/2020  Transportation Screening Complete  Medication Review Press photographer) Complete  PCP or Specialist appointment within 3-5 days of discharge Complete  HRI or Home Care Consult Complete  SW Recovery Care/Counseling Consult Complete  Palliative Care Screening Not [place] Complete  Some recent data might be hidden

## 2020-12-21 NOTE — Evaluation (Signed)
Occupational Therapy Evaluation Patient Details Name: Jennifer Carrillo MRN: [id number] DOB: 1933-08-21 Today's Date: 12/21/2020   History of Present Illness Jennifer Carrillo is an 45yoF who [facility] from home with weakness adn back/leg pain. Pt admitted with sepsis of unknown source. PMH: DM, HTN, CKD4, CAD, Shingles on 12/05/20 s/p valtrex, CA breast on anastrozole, hyperlipidemia, OSA, discoid lupus. Etiology appears non-infectious per ID, no consulting rheumatology.   Clinical Impression   Patient presenting with decreased Ind in self care, balance, functional mobility/transfers, endurance, and safety awareness.Patient reports being mod I with use rollator and lives with family at baseline. She reports being independent in self are tasks and shares IADL responsibilities with husband. She does not drive.Patient needing max A for rolling at bed level for repositioning for comfort secondary to refusal to attempt OOB with 10/10 pain reported. Pt able to perform grooming tasks with set up A. Family is likely unable to provide this level of assistance at home with significant change from baseline. Patient will benefit from acute OT to increase overall independence in the areas of ADLs, functional mobility, and safety awareness in order to safely discharge to next venue of care.      Recommendations for follow up therapy are one component of a multi-disciplinary discharge planning process, led by the attending physician.  Recommendations may be updated based on patient status, additional functional criteria and insurance authorization.   Follow Up Recommendations  Skilled nursing-short term rehab (<3 hours/day)    Assistance Recommended at Discharge Frequent or constant Supervision/Assistance  Functional Status Assessment  Patient has had a recent decline in their functional status and demonstrates the ability to make significant improvements in function in a reasonable and predictable  amount of time.  Equipment Recommendations  Other (comment) (defer to next venue of care)       Precautions / Restrictions Precautions Precautions: Fall Restrictions Weight Bearing Restrictions: No      Mobility Bed Mobility Overal bed mobility: Needs Assistance Bed Mobility: Rolling Rolling: Max assist              Transfers                   General transfer comment: Pt refusal          ADL either performed or assessed with clinical judgement   ADL                                         General ADL Comments: set up A for grooming tasks. Max A for rolling L <> R for positioning.     Vision Patient Visual Report: No change from baseline              Pertinent Vitals/Pain Pain Assessment: Faces Faces Pain Scale: Hurts worst Pain Location: bilat knees and feet with attempted positioning for STS Pain Descriptors / Indicators: Aching;Discomfort Pain Intervention(s): Limited activity within patient's tolerance;Premedicated before session;Repositioned     Hand Dominance Right   Extremity/Trunk Assessment Upper Extremity Assessment Upper Extremity Assessment: Generalized weakness;RUE deficits/detail RUE Deficits / Details: R UE >LUE           Communication Communication Communication: No difficulties   Cognition Arousal/Alertness: Awake/alert Behavior During Therapy: WFL for tasks assessed/performed Overall Cognitive Status: No family/caregiver present to determine baseline cognitive functioning  General Comments: Pt was not oriented to time and reports it as being July.                Home Living Family/patient expects to be discharged to:: Private residence Living Arrangements: Spouse/significant other;Children (husband and son) Available Help at Discharge: Family;Available 24 hours/day Type of Home: House Home Access: Stairs to enter [employer] of Steps:  1 Entrance Stairs-Rails: None Home Layout: One level;Full bath on main level     Bathroom Shower/Tub: Tub/shower unit         Home Equipment: Conservation officer, nature (2 wheels);Cane - quad;Cane - single point;Shower seat          Prior Functioning/Environment Prior Level of Function : Independent/Modified Independent                        OT Problem List: Decreased strength;Decreased activity tolerance;Impaired balance (sitting and/or standing);Decreased knowledge of use of DME or AE;Decreased safety awareness;Pain      OT Treatment/Interventions: Self-care/ADL training;Therapeutic exercise;Therapeutic activities;Energy conservation;Manual therapy;Balance training;DME and/or AE instruction;Patient/family education;Cognitive remediation/compensation    OT Goals(Current goals can be found in the care plan section) Acute Rehab OT Goals Patient Stated Goal: to return home OT Goal Formulation: With patient Time For Goal Achievement: 01/04/21 Potential to Achieve Goals: Good ADL Goals Pt Will Perform Grooming: with min assist Pt Will Perform Lower Body Dressing: with min assist Pt Will Transfer to Toilet: with min assist Pt Will Perform Toileting - Clothing Manipulation and hygiene: with min assist  OT Frequency: Min 2X/week   Barriers to D/C: Decreased caregiver support  feel that it is unlikely that family can provide this level of assistance at discharge          AM-PAC OT "6 Clicks" Daily Activity     Outcome Measure Help from another person eating meals?: None Help from another person taking care of personal grooming?: A Little Help from another person toileting, which includes using toliet, bedpan, or urinal?: Total Help from another person bathing (including washing, rinsing, drying)?: Total Help from another person to put on and taking off regular upper body clothing?: A Lot Help from another person to put on and taking off regular lower body clothing?: Total 6  Click Score: 12   End of Session Nurse Communication: Mobility status  Activity Tolerance: Patient tolerated treatment well Patient left: in bed;with call bell/phone within reach;with bed alarm set  OT Visit Diagnosis: Unsteadiness on feet (R26.81);Muscle weakness (generalized) (M62.81);Pain Pain - part of body:  (generalized)                Time: 3419-3790 OT Time Calculation (min): 20 min Charges:  OT General Charges $OT Visit: 1 Visit OT Evaluation $OT Eval Moderate Complexity: 1 Mod OT Treatments $Therapeutic Activity: 8-22 mins  Darleen Crocker, MS, OTR/L , CBIS ascom [phone]  12/21/20, 4:23 PM

## 2020-12-21 NOTE — NC FL2 (Signed)
[place] LEVEL OF CARE SCREENING TOOL     IDENTIFICATION  Patient Name: Jennifer Carrillo Birthdate: Mar 01, 1933 Sex: female Admission Date (Current Location): 12/18/2020  [facility] and [place] Number:  Engineering geologist and Address:  [facility], [address] [place], [place] [address]      Provider Number: [id number]  Attending Physician Name and Address:  Barb Merino, MD  Relative Name and Phone Number:       Current Level of Care: Hospital Recommended Level of Care: [place] Prior Approval Number:    Date Approved/Denied:   PASRR Number: [id number]  Discharge Plan: SNF    Current Diagnoses: Patient Active Problem List   Diagnosis Date Noted   Acute kidney injury superimposed on CKD IV ([place]) 62/56/3893   Metabolic acidosis 73/42/8768   Rhabdomyolysis 12/18/2020   Sepsis ([place]) 12/18/2020   Hypertensive emergency without congestive heart failure 12/18/2020   Hyperglycemia due to type 2 diabetes mellitus ([place]) 12/18/2020   Shingles 12/05/2020 12/18/2020   Anemia of chronic kidney failure, stage 4 (severe) ([place]) 08/31/2020   Carotid stenosis, symptomatic w/o infarct 06/25/2018   Hypertension 06/18/2018   Carbuncle and furuncle of buttock 06/18/2018   Coronary artery disease involving native coronary artery of native heart 05/19/2018   Old inferior wall myocardial infarction 05/19/2018   Stable angina ([place]) 03/31/2018   Abnormal ECG 03/07/2018   Benign essential HTN 03/07/2018   Bilateral carotid artery stenosis 03/07/2018   SOBOE (shortness of breath on exertion) 03/07/2018   Atherosclerotic peripheral vascular disease with intermittent claudication (HCC) 03/07/2018   Carotid stenosis 02/26/2018   Renal artery stenosis (HCC) 02/26/2018   Anemia 02/05/2018   Leg pain 01/13/2018   PAD (peripheral artery disease) ([place]) 01/13/2018   Diabetes mellitus without complication ([place]) 11/57/2620   Renovascular  hypertension 04/09/2017   Primary osteoarthritis of both knees 03/12/2017   Malignant neoplasm of breast in female, estrogen receptor positive ([place]) 12/16/2015   Iron deficiency anemia 12/08/2015    Orientation RESPIRATION BLADDER Height & Weight     Self, Time, Place  Normal Continent, External catheter Weight: 177 lb 7.5 oz (80.5 kg) Height:  5\' 7"  (170.2 cm)  BEHAVIORAL SYMPTOMS/MOOD NEUROLOGICAL BOWEL NUTRITION STATUS   (None)  (None) Continent Diet (Heart healthy/carb modified)  AMBULATORY STATUS COMMUNICATION OF NEEDS Skin   Extensive Assist Verbally Normal                       Personal Care Assistance Level of Assistance  Bathing, Feeding, Dressing Bathing Assistance: Maximum assistance Feeding assistance: Limited assistance Dressing Assistance: Maximum assistance     Functional Limitations Info  Sight, Hearing, Speech Sight Info: Adequate Hearing Info: Adequate Speech Info: Adequate    SPECIAL CARE FACTORS FREQUENCY  PT (By licensed PT), OT (By licensed OT)     PT Frequency: 5 x week OT Frequency: 5 x week            Contractures Contractures Info: Not present    Additional Factors Info  Code Status, Allergies Code Status Info: Full code Allergies Info: Benazepril           Current Medications (12/21/2020):  This is the current hospital active medication list Current Facility-Administered Medications  Medication Dose Route Frequency Provider Last Rate Last Admin   acetaminophen (TYLENOL) tablet 650 mg  650 mg Oral Q6H PRN Athena Masse, MD   650 mg at 12/21/20 3559   Or   acetaminophen (  TYLENOL) suppository 650 mg  650 mg Rectal Q6H PRN Athena Masse, MD       amLODipine (NORVASC) tablet 10 mg  10 mg Oral Daily Judd Gaudier V, MD   10 mg at 12/21/20 2376   anastrozole (ARIMIDEX) tablet 1 mg  1 mg Oral QPC supper Pokhrel, Laxman, MD   1 mg at 12/20/20 1839   cefTRIAXone (ROCEPHIN) 2 g in sodium chloride 0.9 % 100 mL IVPB  2 g Intravenous  Q24H Ravishankar, Joellyn Quails, MD       digoxin (LANOXIN) tablet 0.0625 mg  0.0625 mg Oral Q48H Pokhrel, Laxman, MD   0.0625 mg at 12/19/20 1225   docusate sodium (COLACE) capsule 200 mg  200 mg Oral QHS PRN Pokhrel, Laxman, MD       donepezil (ARICEPT) tablet 10 mg  10 mg Oral QHS Pokhrel, Laxman, MD   10 mg at 12/20/20 2056   hydrALAZINE (APRESOLINE) injection 10 mg  10 mg Intravenous Q4H PRN Athena Masse, MD   10 mg at 12/19/20 1757   hydrALAZINE (APRESOLINE) tablet 100 mg  100 mg Oral Q8H Pokhrel, Laxman, MD   100 mg at 12/21/20 0527   insulin aspart (novoLOG) injection 0-15 Units  0-15 Units Subcutaneous Q4H Judd Gaudier V, MD   2 Units at 12/21/20 0037   isosorbide mononitrate (IMDUR) 24 hr tablet 60 mg  60 mg Oral Daily Pokhrel, Laxman, MD   60 mg at 12/21/20 0925   labetalol (NORMODYNE) injection 20 mg  20 mg Intravenous Q2H PRN Pokhrel, Laxman, MD       mometasone-formoterol (DULERA) 100-5 MCG/ACT inhaler 2 puff  2 puff Inhalation BID Pokhrel, Laxman, MD   2 puff at 12/21/20 0925   ondansetron (ZOFRAN) tablet 4 mg  4 mg Oral Q6H PRN Athena Masse, MD       Or   ondansetron [facility]) injection 4 mg  4 mg Intravenous Q6H PRN Athena Masse, MD       ranolazine (RANEXA) 12 hr tablet 500 mg  500 mg Oral Daily Dallie Piles, RPH   500 mg at 12/21/20 2831   sodium bicarbonate 150 mEq in sterile water 1,150 mL infusion   Intravenous Continuous Pokhrel, Laxman, MD 100 mL/hr at 12/21/20 0001 New Bag at 12/21/20 0001     Discharge Medications: Please see discharge summary for a list of discharge medications.  Relevant Imaging Results:  Relevant Lab Results:   Additional Information SS#: [id number]  Candie Chroman, LCSW

## 2020-12-21 NOTE — Evaluation (Signed)
Physical Therapy Evaluation Patient Details Name: Jennifer Carrillo MRN: [id number] DOB: 1933-11-14 Today's Date: 12/21/2020  History of Present Illness  Jennifer Carrillo is an 77yoF who comes to [facility] on 12/11 from home with weakness adn back/leg pain. Pt admitted with sepsis of unknown source. PMH: DM, HTN, CKD4, CAD, Shingles on 12/05/20 s/p valtrex, CA breast on anastrozole, hyperlipidemia, OSA, discoid lupus. Etiology appears non-infectious per ID, no consulting rheumatology.  Clinical Impression  Pt admitted with above diagnosis. Pt currently with functional limitations due to the deficits listed below (see "PT Problem List"). Upon entry, pt in bed, awake and agreeable to participate. Pt severely weak, struggles to get to bed with 25% physical assistance, and with >75% physical assist and 4 attempts, is unable to achieve a standing position. Both pain and weakness contribute, but mild anxiety is also a factor. Pt is not safe to return to home, would require a degree of caregiver assistance that would likely result in multiple persons either falling and/or getting injured. Patient's performance this date reveals decreased ability, independence, and tolerance in performing all basic mobility required for performance of activities of daily living. Pt requires additional DME, close physical assistance, and cues for safe participate in mobility. Pt will benefit from skilled PT intervention to increase independence and safety with basic mobility in preparation for discharge to the venue listed below.         Recommendations for follow up therapy are one component of a multi-disciplinary discharge planning process, led by the attending physician.  Recommendations may be updated based on patient status, additional functional criteria and insurance authorization.  Follow Up Recommendations Skilled nursing-short term rehab (<3 hours/day)    Assistance Recommended at Discharge Set up Supervision/Assistance   Functional Status Assessment Patient has had a recent decline in their functional status and demonstrates the ability to make significant improvements in function in a reasonable and predictable amount of time.  Equipment Recommendations  None recommended by PT    Recommendations for Other Services       Precautions / Restrictions Precautions Precautions: Fall Restrictions Weight Bearing Restrictions: No      Mobility  Bed Mobility Overal bed mobility: Needs Assistance Bed Mobility: Supine to Sit     Supine to sit: Min assist     General bed mobility comments: minA provided after 3 minutes attempt, unable to sit up straight or scoot    Transfers Overall transfer level: Needs assistance Equipment used: None Transfers: Sit to/from Stand Sit to Stand: Total assist;+2 physical assistance           General transfer comment: attempted 4x, buttocks cleared, pt unable to pull feet under her body, fails into short sitting each time.    Ambulation/Gait                  Stairs            Wheelchair Mobility    Modified Rankin (Stroke Patients Only)       Balance                                             Pertinent Vitals/Pain Pain Assessment: Faces Faces Pain Scale: Hurts whole lot Pain Location: bilat knees and feet with attempted positioning for STS Pain Intervention(s): Limited activity within patient's tolerance;Premedicated before session;Repositioned    Home Living Family/patient expects to be discharged to::  Private residence Living Arrangements: Spouse/significant other;Children (husband and son)   Type of Home: House Home Access: Stairs to enter   Technical brewer of Steps: 1   Bridger: One level;Full bath on main level        Prior Function                       Hand Dominance        Extremity/Trunk Assessment   Upper Extremity Assessment Upper Extremity Assessment: Generalized  weakness    Lower Extremity Assessment Lower Extremity Assessment: Generalized weakness       Communication      Cognition Arousal/Alertness: Awake/alert Behavior During Therapy: WFL for tasks assessed/performed Overall Cognitive Status: Within Functional Limits for tasks assessed                                          General Comments      Exercises     Assessment/Plan    PT Assessment Patient needs continued PT services  PT Problem List Decreased strength;Decreased range of motion;Decreased activity tolerance;Decreased balance;Decreased mobility;Decreased knowledge of precautions       PT Treatment Interventions DME instruction;Gait training;Stair training;Functional mobility training;Therapeutic activities;Therapeutic exercise;Balance training;Patient/family education;Wheelchair mobility training;Neuromuscular re-education    PT Goals (Current goals can be found in the Care Plan section)  Acute Rehab PT Goals Patient Stated Goal: less pain, more strong PT Goal Formulation: With patient Time For Goal Achievement: 01/04/21 Potential to Achieve Goals: Fair    Frequency Min 2X/week   Barriers to discharge Inaccessible home environment;Decreased caregiver support      Co-evaluation               AM-PAC PT "6 Clicks" Mobility  Outcome Measure Help needed turning from your back to your side while in a flat bed without using bedrails?: A Lot Help needed moving from lying on your back to sitting on the side of a flat bed without using bedrails?: A Lot Help needed moving to and from a bed to a chair (including a wheelchair)?: Total Help needed standing up from a chair using your arms (e.g., wheelchair or bedside chair)?: Total Help needed to walk in hospital room?: Total Help needed climbing 3-5 steps with a railing? : Total 6 Click Score: 8    End of Session   Activity Tolerance: Patient limited by fatigue;Patient limited by pain Patient  left: in bed;with nursing/sitter in room;with call bell/phone within reach Nurse Communication: Mobility status (IV leaking) PT Visit Diagnosis: Unsteadiness on feet (R26.81);Difficulty in walking, not elsewhere classified (R26.2);Other abnormalities of gait and mobility (R26.89);Muscle weakness (generalized) (M62.81)    Time: 8338-2505 PT Time Calculation (min) (ACUTE ONLY): 17 min   Charges:   PT Evaluation $PT Eval Moderate Complexity: 1 Mod PT Treatments $Therapeutic Exercise: 8-22 mins      9:34 AM, 12/21/20 Etta Grandchild, PT, DPT Physical Therapist - [facility]  [phone] ([place])    Delcie Ruppert C 12/21/2020, 9:32 AM

## 2020-12-21 NOTE — Plan of Care (Signed)
°  Problem: Education: Goal: Knowledge of General Education information will improve Description: Including pain rating scale, medication(s)/side effects and non-pharmacologic comfort measures Outcome: Progressing   Problem: Health Behavior/Discharge Planning: Goal: Ability to manage health-related needs will improve Outcome: Progressing   Problem: Clinical Measurements: Goal: Ability to maintain clinical measurements within normal limits will improve Outcome: Progressing Goal: Respiratory complications will improve Outcome: Progressing   Problem: Activity: Goal: Risk for activity intolerance will decrease Outcome: Progressing   Problem: Nutrition: Goal: Adequate nutrition will be maintained Outcome: Progressing   Problem: Elimination: Goal: Will not experience complications related to urinary retention Outcome: Progressing   Problem: Pain Managment: Goal: General experience of comfort will improve Outcome: Progressing   Problem: Safety: Goal: Ability to remain free from injury will improve Outcome: Progressing   Problem: Skin Integrity: Goal: Risk for impaired skin integrity will decrease Outcome: Progressing

## 2020-12-21 NOTE — Consult Note (Signed)
Reason for Consult: Bilateral knee pain and leg weakness  Referring Physician: Hospitalist  [name]   HPI: 87 year old African-American female.  History of CKD.  History of osteoarthritis.  Prior knee injections.  Contemplating total knee replacement Remote history of breast cancer 2 months ago she had swelling in the right rash with some tenderness.  No significant swelling in the fingers or the left wrist.  3 weeks ago she developed shingles.  Took antiviral.  Around that time started swelling in both knees.  Had pain in both feet.  Had right wrist she had some confusion after taking the antibiotics.  Crying spells.  Nightmares.  She was admitted with low-grade fever.  No significant leukocytosis.  Creatinine at 4.1.  Pain in the legs which she says mainly is in the knees and feet.  She prior to hospitalization was ambulatory CPK was initially elevated and came down to 286.  She has been on statin.  She had MRI of the lumbar spine with some lumbar disc disease but no significant stenosis.  She had no pain CT of the head Currently both knees bother her.  Does not want to get out of bed because of that.  Pain in the right fifth toe and some across the toes on the left.  Pain in the right.  No prior history of podagra.  No history of kidney stones  PMH: Diabetes.  Hypertension.  CKD.  SURGICAL HISTORY: No joint surgeries  Family History: No family history of gout  Social History: No significant cigarettes or alcohol  Allergies:  Allergies  Allergen Reactions   Benazepril Nausea Only    Medications: No nonsteroidals.  Not on steroids.       ROS: No chest pain.  No abdominal pain.  No recent shortness of breath.   PHYSICAL EXAM: Blood pressure (!) 159/51, pulse 88, temperature 99.2 F (37.3 C), temperature source Oral, resp. rate 17, height 5\' 7"  (1.702 m), weight 80.5 kg, SpO2 96 %. Pleasant female.  No acute distress except when she moves her knees.  Clear chest.  1+  edema. Musculoskeletal: Mild decreased range of motion cervical spine.  Mild painful arc of motion right shoulder.  Right wrist synovitis.  No elbow nodules.  MCPs not swollen.  Left wrist without significant synovitis.  Both hips roll reasonably.  Significant bilateral knee effusions.  Right fifth MTP is tender.  Positive metatarsal squeeze on the left.  Hard to examine her power in her legs given her pain in her knees  Procedure: The right knee prepped in sterile manner.  Aspirated 35 cc inflammatory fluid.  Sent for microscopy sent.  Injected with 2 cc Xylocaine 1 cc of Kenalog  Procedure #2: Left knee prepped in sterile manner.  Aspirated 35 cc inflammatory fluid.  Intact injected with 2 cc Xylocaine and 1 cc Kenalog  Assessment: Polyarticular inflammatory .  Most likely crystalline in the setting of renal insufficiency.  Uric acid 7.1.  Previous flare in right wrist.  Some involvement of the toes CKD, worse Anemia Diabetes  Recommendations: Fluid sent for microscopy.  If positive for monosodium urate crystals would begin allopurinol 50 mg a day Consider prednisone taper.  Do not start prednisone tonight since she had cortisone injections but that could be started in the morning  Emmaline Kluver 12/21/2020, 7:32 PM

## 2020-12-21 NOTE — Progress Notes (Signed)
PROGRESS NOTE  Jennifer Carrillo ATF:[id number] DOB: [birth year] DOA: 12/18/2020 PCP: [name], MD   LOS: 3 days   Brief narrative: Jennifer Carrillo is a 87 y.o. female with past medical history of diabetes mellitus type 2, hypertension, CKD stage IV, coronary artery disease, history of shingles diagnosed on 11/28 treated with Valtrex and gabapentin presented to hospital with 1 day history of diffuse pain extending from her lower back to her legs to the level where she was unable to get out of the bed.  History was limited due to patient's confusion.  In the ED, patient was noted to have a elevated blood pressure of 202/80 with a temperature of 101.8 F and was mildly tachycardic.  WBC initially was 15,000.  Creatinine was 4.1 from baseline of 2.9 with a bicarb of 18 and a normal anion gap.  Chest x-ray did not show any acute findings.  MRI of the lumbar spine did not show any evidence of spinal infection or fracture.  Patient received septic fluid bolus in the ED including broad-spectrum antibiotic and  was then admitted hospital for further evaluation and treatment.     Assessment/Plan:  Principal Problem:   Sepsis ([place]) Active Problems:   PAD (peripheral artery disease) ([place])   Coronary artery disease involving native coronary artery of native heart   Anemia of chronic kidney failure, stage 4 (severe) (HCC)   Acute kidney injury superimposed on CKD IV (H[facility])   Metabolic acidosis   Rhabdomyolysis   Hypertensive emergency without congestive heart failure   Hyperglycemia due to type 2 diabetes mellitus ([place])   Shingles 12/05/2020   Severe sepsis present on admission with altered mental status and acute kidney injury:  Likely because polyarthritis.   Presented with fever, tachycardia and leukocytosis.  Recent history of treated shingles.  Procalcitonin normal.  Lactate normal.   Lumbar puncture without evidence of meningitis, antibiotics discontinued including vancomycin, ampicillin and  acyclovir.   MRI lumbosacral spine negative for infection.  Urinalysis was unremarkable.  Blood cultures negative so far.   Followed by ID, remains on ceftriaxone today.   Suspect polyarthritis with multiple joint pain and swelling, discussed with rheumatology for consultation.  May benefit with diagnostic knee tap.   Bilateral knee x-ray, uric acid, CRP and ESR today.    Altered mental status likely metabolic infective encephalopathy. Adequately improved.  Mental status back to normal today.   Acute kidney injury superimposed on CKD IV with metabolic acidosis Likely prerenal in etiology.  CK level is mildly elevated at 804.  On IV fluids with bicarb.  Continue to  Hold HCTZ.,  Telmisartan.  Creatinine 4.1.  Continue bicarb drip today.  Mild cognitive dysfunction with metabolic encephalopathy. At baseline.  Continue delirium precautions.  Resumed Aricept.   Rhabdomyolysis, nontraumatic Mild.  Continue IV fluids with sodium bicarb.  Hold statins.  CK level trending down.   Hypertensive urgency.  BP high of 233/89 condition.  Has improved with initiation of medication.  We will continue to monitor.  Hyperglycemia due to type 2 diabetes mellitus  Stable today.   History of shingles 12/05/2020 Completed Valtrex.   PAD (peripheral artery disease Continue Ranexa, hold Plavix for now.  Continue to hold statins for now.   Coronary artery disease involving native coronary artery of native heart Continue Ranexa,  isosorbide.  Hold statins, Plavix for now.  Will resume Plavix tomorrow.   History of breast cancer Continue Arimidex from home.  Generalized weakness at home.  Eval by  PT OT.  Recommended SNF.  DVT prophylaxis: SCDs Start: 12/18/20 2238   Code Status: Full code  Family Communication:  Patient's husband and son at the bedside.  Status is: Inpatient  Remains inpatient appropriate because: Mental status, possible CNS infection, need for lumbar  puncture  Consultants: Infectious disease  Procedures: Lumbar puncture  Anti-infectives:  Vancomycin,ampicillin and acyclovir 12/11-12/13 Rocephin 12/11---  Anti-infectives (From admission, onward)    Start     Dose/Rate Route Frequency Ordered Stop   12/21/20 1200  cefTRIAXone (ROCEPHIN) 2 g in sodium chloride 0.9 % 100 mL IVPB        2 g 200 mL/hr over 30 Minutes Intravenous Every 24 hours 12/20/20 1805     12/21/20 0200  vancomycin (VANCOREADY) IVPB 750 mg/150 mL  Status:  Discontinued        750 mg 150 mL/hr over 60 Minutes Intravenous Every 48 hours 12/18/20 2312 12/20/20 1416   12/20/20 2200  vancomycin (VANCOREADY) IVPB 750 mg/150 mL  Status:  Discontinued        750 mg 150 mL/hr over 60 Minutes Intravenous Every 48 hours 12/20/20 1416 12/20/20 1805   12/19/20 2200  ceFEPIme (MAXIPIME) 2 g in sodium chloride 0.9 % 100 mL IVPB  Status:  Discontinued        2 g 200 mL/hr over 30 Minutes Intravenous Every 24 hours 12/18/20 2309 12/19/20 1114   12/19/20 2200  cefTRIAXone (ROCEPHIN) 2 g in sodium chloride 0.9 % 100 mL IVPB  Status:  Discontinued        2 g 200 mL/hr over 30 Minutes Intravenous Every 12 hours 12/19/20 1114 12/20/20 1805   12/19/20 1130  ampicillin (OMNIPEN) 2 g in sodium chloride 0.9 % 100 mL IVPB  Status:  Discontinued        2 g 300 mL/hr over 20 Minutes Intravenous Every 12 hours 12/19/20 1116 12/20/20 1805   12/19/20 0200  vancomycin (VANCOCIN) IVPB 1000 mg/200 mL premix       See Hyperspace for full Linked Orders Report.   1,000 mg 200 mL/hr over 60 Minutes Intravenous  Once 12/19/20 0042 12/19/20 0204   12/19/20 0200  vancomycin (VANCOREADY) IVPB 1500 mg/300 mL       See Hyperspace for full Linked Orders Report.   1,500 mg 150 mL/hr over 120 Minutes Intravenous  Once 12/19/20 0042 12/19/20 0453   12/19/20 0130  acyclovir (ZOVIRAX) 825 mg in dextrose 5 % 150 mL IVPB  Status:  Discontinued        10 mg/kg  82.3 kg (Adjusted) 166.5 mL/hr over 60  Minutes Intravenous Every 24 hours 12/19/20 0044 12/20/20 1825   12/18/20 2330  acyclovir (ZOVIRAX) 825 mg in dextrose 5 % 150 mL IVPB  Status:  Discontinued        10 mg/kg  82.3 kg (Adjusted) 166.5 mL/hr over 60 Minutes Intravenous Every 24 hours 12/18/20 2318 12/19/20 0044   12/18/20 2300  metroNIDAZOLE (FLAGYL) IVPB 500 mg  Status:  Discontinued        500 mg 100 mL/hr over 60 Minutes Intravenous Every 12 hours 12/18/20 2251 12/19/20 1115   12/18/20 2230  vancomycin (VANCOREADY) IVPB 1500 mg/300 mL  Status:  Discontinued       See Hyperspace for full Linked Orders Report.   1,500 mg 150 mL/hr over 120 Minutes Intravenous  Once 12/18/20 2123 12/19/20 0042   12/18/20 2130  ceFEPIme (MAXIPIME) 2 g in sodium chloride 0.9 % 100 mL IVPB  2 g 200 mL/hr over 30 Minutes Intravenous  Once 12/18/20 2117 12/19/20 0000   12/18/20 2130  metroNIDAZOLE (FLAGYL) IVPB 500 mg  Status:  Discontinued        500 mg 100 mL/hr over 60 Minutes Intravenous  Once 12/18/20 2117 12/18/20 2342   12/18/20 2130  vancomycin (VANCOCIN) IVPB 1000 mg/200 mL premix  Status:  Discontinued        1,000 mg 200 mL/hr over 60 Minutes Intravenous  Once 12/18/20 2117 12/18/20 2123   12/18/20 2130  vancomycin (VANCOCIN) IVPB 1000 mg/200 mL premix  Status:  Discontinued       See Hyperspace for full Linked Orders Report.   1,000 mg 200 mL/hr over 60 Minutes Intravenous  Once 12/18/20 2123 12/19/20 0042       Subjective:  Patient seen and examined.  She was alert awake and interactive.  She has moderate to severe pain on wrist joints and legs, knee joints and had very hard time getting out of the bed to the chair.  Low-grade fever overnight.  Denies any nausea or vomiting.  Urine and bowel habits are normal.  Denies any history of rheumatological disorder or joint pains.  Objective: Vitals:   12/21/20 0406 12/21/20 0736  BP: (!) 170/43 (!) 163/56  Pulse: 88 91  Resp: 20 16  Temp: 99.3 F (37.4 C) 99.2 F (37.3  C)  SpO2: 100% 95%    Intake/Output Summary (Last 24 hours) at 12/21/2020 1341 Last data filed at 12/21/2020 1018 Gross per 24 hour  Intake 2716.61 ml  Output 300 ml  Net 2416.61 ml   Filed Weights   12/18/20 0751 12/18/20 0755 12/19/20 2056  Weight: 82 kg 113.4 kg 80.5 kg   Body mass index is 27.8 kg/m.   Physical Exam:  GENERAL: Alert and awake.  Looks comfortable when resting.  Moderate distress on bilateral knee exam. HENT: No scleral pallor or icterus. Pupils equally reactive to light. Oral mucosa is moist.  No obvious neck tenderness noted. NECK: is supple, no gross swelling noted. CHEST: Clear to auscultation. No crackles or wheezes.   CVS: S1 and S2 heard, no murmur. Regular rate and rhythm.  ABDOMEN: Soft, non-tender, bowel sounds are present. EXTREMITIES: Generalized tenderness mostly along the both knee joints, positive for patellar tap and effusion.  No redness or erythema.   Data Review: I have personally reviewed the following laboratory data and studies,  CBC: Recent Labs  Lab 12/18/20 0834 12/19/20 0616 12/20/20 0231  WBC 15.2* 19.2* 14.4*  HGB 11.0* 10.3* 9.2*  HCT 35.0* 33.3* 28.2*  MCV 86.4 84.1 82.5  PLT 331 337 024   Basic Metabolic Panel: Recent Labs  Lab 12/18/20 0834 12/19/20 0616 12/20/20 0231 12/21/20 0241  NA 138 138 139  --   K 4.9 4.3 3.8  --   CL 110 106 103  --   CO2 18* 21* 27  --   GLUCOSE 219* 207* 108*  --   BUN 46* 46* 49*  --   CREATININE 4.11* 3.64* 3.59* 4.28*  CALCIUM 9.0 8.8* 8.2*  --   MG  --   --  1.8  --   PHOS  --   --  3.7  --    Liver Function Tests: Recent Labs  Lab 12/18/20 0834 12/19/20 0616  AST 28 36  ALT 15 17  ALKPHOS 91 93  BILITOT 1.0 1.0  PROT 7.0 7.3  ALBUMIN 3.3* 2.9*   No results for input(s): LIPASE, AMYLASE  in the last 168 hours. No results for input(s): AMMONIA in the last 168 hours. Cardiac Enzymes: Recent Labs  Lab 12/18/20 0834 12/20/20 0231  CKTOTAL 804* 286*   BNP  (last 3 results) No results for input(s): BNP in the last 8760 hours.  ProBNP (last 3 results) No results for input(s): PROBNP in the last 8760 hours.  CBG: Recent Labs  Lab 12/20/20 2019 12/20/20 2329 12/21/20 0500 12/21/20 0735 12/21/20 1135  GLUCAP 119* 121* 106* 100* 141*   Recent Results (from the past 240 hour(s))  Resp Panel by RT-PCR (Flu A&B, Covid) Nasopharyngeal Swab     Status: None   Collection Time: 12/18/20  5:19 PM   Specimen: Nasopharyngeal Swab; Nasopharyngeal(NP) swabs in vial transport medium  Result Value Ref Range Status   SARS Coronavirus 2 by RT PCR NEGATIVE NEGATIVE Final    Comment: (NOTE) SARS-CoV-2 target nucleic acids are NOT DETECTED.  The SARS-CoV-2 RNA is generally detectable in upper respiratory specimens during the acute phase of infection. The lowest concentration of SARS-CoV-2 viral copies this assay can detect is 138 copies/mL. A negative result does not preclude SARS-Cov-2 infection and should not be used as the sole basis for treatment or other patient management decisions. A negative result may occur with  improper specimen collection/handling, submission of specimen other than nasopharyngeal swab, presence of viral mutation(s) within the areas targeted by this assay, and inadequate number of viral copies(<138 copies/mL). A negative result must be combined with clinical observations, patient history, and epidemiological information. The expected result is Negative.  Fact Sheet for Patients:  EntrepreneurPulse.com.au  Fact Sheet for Healthcare Providers:  IncredibleEmployment.be  This test is no t yet approved or cleared by the [place] FDA and  has been authorized for detection and/or diagnosis of SARS-CoV-2 by FDA under an Emergency Use Authorization (EUA). This EUA will remain  in effect (meaning this test can be used) for the duration of the COVID-19 declaration under Section 564(b)(1)  of the Act, 21 U.S.C.section 360bbb-3(b)(1), unless the authorization is terminated  or revoked sooner.       Influenza A by PCR NEGATIVE NEGATIVE Final   Influenza B by PCR NEGATIVE NEGATIVE Final    Comment: (NOTE) The Xpert Xpress SARS-CoV-2/FLU/RSV plus assay is intended as an aid in the diagnosis of influenza from Nasopharyngeal swab specimens and should not be used as a sole basis for treatment. Nasal washings and aspirates are unacceptable for Xpert Xpress SARS-CoV-2/FLU/RSV testing.  Fact Sheet for Patients: EntrepreneurPulse.com.au  Fact Sheet for Healthcare Providers: IncredibleEmployment.be  This test is not yet approved or cleared by the [place] FDA and has been authorized for detection and/or diagnosis of SARS-CoV-2 by FDA under an Emergency Use Authorization (EUA). This EUA will remain in effect (meaning this test can be used) for the duration of the COVID-19 declaration under Section 564(b)(1) of the Act, 21 U.S.C. section 360bbb-3(b)(1), unless the authorization is terminated or revoked.  Performed at [facility], [address] [place], [place] [address]   Urine Culture     Status: Abnormal   Collection Time: 12/18/20  5:30 PM   Specimen: Urine, Clean Catch  Result Value Ref Range Status   Specimen Description   Final    URINE, CLEAN CATCH Performed at [facility], [address], [place], [place] [address]    Special Requests   Final    NONE Performed at [facility], [address], [place], [place] [address]    Culture (  A)  Final    <10,000 COLONIES/mL INSIGNIFICANT GROWTH Performed at [facility] [address] [place], [place] [address]    Report Status 12/19/2020 FINAL  Final  Blood culture (routine x 2)     Status: None (Preliminary result)   Collection Time: 12/18/20 10:30 PM   Specimen: BLOOD  Result Value Ref Range Status   Specimen Description BLOOD BLOOD  RIGHT HAND  Final   Special Requests   Final    BOTTLES DRAWN AEROBIC AND ANAEROBIC Blood Culture adequate volume   Culture   Final    NO GROWTH 3 DAYS Performed at [facility], [address] [place], [place] [address]    Report Status PENDING  Incomplete  Blood culture (routine x 2)     Status: None (Preliminary result)   Collection Time: 12/18/20 10:35 PM   Specimen: BLOOD  Result Value Ref Range Status   Specimen Description BLOOD BLOOD LEFT HAND  Final   Special Requests   Final    BOTTLES DRAWN AEROBIC AND ANAEROBIC Blood Culture adequate volume   Culture   Final    NO GROWTH 3 DAYS Performed at [facility], [address], [place], [place] [address]    Report Status PENDING  Incomplete  CSF culture w Gram Stain     Status: None (Preliminary result)   Collection Time: 12/20/20  9:45 AM   Specimen: PATH Cytology CSF; Cerebrospinal Fluid  Result Value Ref Range Status   Specimen Description   Final    CSF Performed at [facility], [address] [place], [place] [address]    Special Requests   Final    NONE Performed at [facility], [address] [place], [place] [address]    Gram Stain   Final    WBC SEEN NO RBC SEEN NO ORGANISMS SEEN Performed at [facility], [address], [place], [place] [address]    Culture   Final    NO GROWTH < 24 HOURS Performed at [facility], [place] [address], [place], [place] [address]    Report Status PENDING  Incomplete     Studies: DG FL GUIDED LUMBAR PUNCTURE  Result Date: 12/20/2020 CLINICAL DATA:  Patient complains of back pain with concern for infection request received for diagnostic lumbar puncture. EXAM: DIAGNOSTIC LUMBAR PUNCTURE UNDER FLUOROSCOPIC GUIDANCE COMPARISON:  MR lumbar spine 12/18/2020, lumbar spine x-rays 12/18/2020 reviewed prior to the procedure. FLUOROSCOPY TIME:  Fluoroscopy Time:  0.2 minute Radiation Exposure Index (if provided by the  fluoroscopic device): 6.60 mGy Number of Acquired Spot Images: 2 PROCEDURE: Informed consent was obtained from the patient's legal guardian prior to the procedure, including potential complications of bleeding, infection, paresthesias, nerve damage, CSF leak requiring additional procedures, post procedure requirement to lay flat for several hours after the procedure, headache, allergy, and pain. With the patient prone, the lower back was prepped with Betadine. 1% Lidocaine was used for local anesthesia. Lumbar puncture was performed at the L4-L5 level using a 20 gauge needle with return of colorless CSF with an opening pressure of 17 cm water. 10 ml of CSF were obtained for laboratory studies. The inner stylet was placed back into the needle and the needle was removed in its entirety. The patient tolerated the procedure well and there were no apparent complications. A sterile bandage was applied. IMPRESSION: Technically successful fluoroscopic guided lumbar puncture. This exam was performed by Tsosie Billing PA-C, and was supervised and interpreted  by Dr. Posey Pronto. Electronically Signed   By: Kathreen Devoid M.D.   On: 12/20/2020 10:25    Total time spent: 30 minutes  Barb Merino, MD  [facility] 12/21/2020  If 7PM-7AM, please contact night-coverage

## 2020-12-22 ENCOUNTER — Inpatient Hospital Stay: Payer: Medicare HMO

## 2020-12-22 DIAGNOSIS — M1120 Other chondrocalcinosis, unspecified site: Secondary | ICD-10-CM

## 2020-12-22 LAB — GLUCOSE, CAPILLARY
Glucose-Capillary: 120 mg/dL — ABNORMAL HIGH (ref 70–99)
Glucose-Capillary: 170 mg/dL — ABNORMAL HIGH (ref 70–99)
Glucose-Capillary: 175 mg/dL — ABNORMAL HIGH (ref 70–99)
Glucose-Capillary: 205 mg/dL — ABNORMAL HIGH (ref 70–99)
Glucose-Capillary: 210 mg/dL — ABNORMAL HIGH (ref 70–99)
Glucose-Capillary: 217 mg/dL — ABNORMAL HIGH (ref 70–99)
Glucose-Capillary: 229 mg/dL — ABNORMAL HIGH (ref 70–99)

## 2020-12-22 LAB — CBC WITH DIFFERENTIAL/PLATELET
Abs Immature Granulocytes: 0.5 10*3/uL — ABNORMAL HIGH (ref 0.00–0.07)
Basophils Absolute: 0.1 10*3/uL (ref 0.0–0.1)
Basophils Relative: 0 %
Eosinophils Absolute: 0 10*3/uL (ref 0.0–0.5)
Eosinophils Relative: 0 %
HCT: 28 % — ABNORMAL LOW (ref 36.0–46.0)
Hemoglobin: 9.1 g/dL — ABNORMAL LOW (ref 12.0–15.0)
Immature Granulocytes: 4 %
Lymphocytes Relative: 8 %
Lymphs Abs: 1.1 10*3/uL (ref 0.7–4.0)
MCH: 26.6 pg (ref 26.0–34.0)
MCHC: 32.5 g/dL (ref 30.0–36.0)
MCV: 81.9 fL (ref 80.0–100.0)
Monocytes Absolute: 0.5 10*3/uL (ref 0.1–1.0)
Monocytes Relative: 4 %
Neutro Abs: 10.7 10*3/uL — ABNORMAL HIGH (ref 1.7–7.7)
Neutrophils Relative %: 84 %
Platelets: 347 10*3/uL (ref 150–400)
RBC: 3.42 MIL/uL — ABNORMAL LOW (ref 3.87–5.11)
RDW: 17.4 % — ABNORMAL HIGH (ref 11.5–15.5)
WBC: 12.9 10*3/uL — ABNORMAL HIGH (ref 4.0–10.5)
nRBC: 0 % (ref 0.0–0.2)

## 2020-12-22 LAB — PHOSPHORUS: Phosphorus: 5.6 mg/dL — ABNORMAL HIGH (ref 2.5–4.6)

## 2020-12-22 LAB — BASIC METABOLIC PANEL
Anion gap: 13 (ref 5–15)
BUN: 56 mg/dL — ABNORMAL HIGH (ref 8–23)
CO2: 33 mmol/L — ABNORMAL HIGH (ref 22–32)
Calcium: 7.8 mg/dL — ABNORMAL LOW (ref 8.9–10.3)
Chloride: 91 mmol/L — ABNORMAL LOW (ref 98–111)
Creatinine, Ser: 4.1 mg/dL — ABNORMAL HIGH (ref 0.44–1.00)
GFR, Estimated: 10 mL/min — ABNORMAL LOW (ref >60)
Glucose, Bld: 151 mg/dL — ABNORMAL HIGH (ref 70–99)
Potassium: 3.4 mmol/L — ABNORMAL LOW (ref 3.5–5.1)
Sodium: 137 mmol/L (ref 135–145)

## 2020-12-22 LAB — MAGNESIUM: Magnesium: 2.1 mg/dL (ref 1.7–2.4)

## 2020-12-22 LAB — SEDIMENTATION RATE: Sed Rate: 127 mm/hr — ABNORMAL HIGH (ref 0–30)

## 2020-12-22 LAB — C-REACTIVE PROTEIN: CRP: 27.2 mg/dL — ABNORMAL HIGH (ref <1.0)

## 2020-12-22 MED ORDER — PREDNISONE 20 MG PO TABS
20.0000 mg | ORAL_TABLET | Freq: Every day | ORAL | Status: DC
  Administered 2020-12-22 – 2020-12-26 (×5): 20 mg via ORAL
  Filled 2020-12-22 (×5): qty 1

## 2020-12-22 MED ORDER — HEPARIN SODIUM (PORCINE) 5000 UNIT/ML IJ SOLN
5000.0000 [IU] | Freq: Three times a day (TID) | INTRAMUSCULAR | Status: DC
  Administered 2020-12-22 – 2021-01-01 (×28): 5000 [IU] via SUBCUTANEOUS
  Filled 2020-12-22 (×28): qty 1

## 2020-12-22 MED ORDER — ATORVASTATIN CALCIUM 20 MG PO TABS
40.0000 mg | ORAL_TABLET | Freq: Every day | ORAL | Status: DC
  Administered 2020-12-22 – 2021-01-01 (×10): 40 mg via ORAL
  Filled 2020-12-22 (×10): qty 2

## 2020-12-22 MED ORDER — TRAMADOL HCL 50 MG PO TABS
50.0000 mg | ORAL_TABLET | Freq: Two times a day (BID) | ORAL | Status: DC | PRN
  Administered 2020-12-22 – 2020-12-31 (×5): 50 mg via ORAL
  Filled 2020-12-22 (×6): qty 1

## 2020-12-22 MED ORDER — CLOPIDOGREL BISULFATE 75 MG PO TABS
75.0000 mg | ORAL_TABLET | Freq: Every day | ORAL | Status: DC
  Administered 2020-12-22 – 2020-12-26 (×5): 75 mg via ORAL
  Filled 2020-12-22 (×5): qty 1

## 2020-12-22 NOTE — Plan of Care (Signed)
  Problem: Education: Goal: Knowledge of General Education information will improve Description: Including pain rating scale, medication(s)/side effects and non-pharmacologic comfort measures Outcome: Progressing   Problem: Health Behavior/Discharge Planning: Goal: Ability to manage health-related needs will improve Outcome: Progressing   Problem: Clinical Measurements: Goal: Ability to maintain clinical measurements within normal limits will improve Outcome: Progressing Goal: Respiratory complications will improve Outcome: Progressing   Problem: Activity: Goal: Risk for activity intolerance will decrease Outcome: Progressing   Problem: Nutrition: Goal: Adequate nutrition will be maintained Outcome: Progressing   Problem: Coping: Goal: Level of anxiety will decrease Outcome: Progressing   Problem: Pain Managment: Goal: General experience of comfort will improve Outcome: Progressing   Problem: Safety: Goal: Ability to remain free from injury will improve Outcome: Progressing   Problem: Skin Integrity: Goal: Risk for impaired skin integrity will decrease Outcome: Progressing   

## 2020-12-22 NOTE — Progress Notes (Signed)
Occupational Therapy Treatment Patient Details Name: Jennifer Carrillo MRN: [id number] DOB: December 16, 1933 Today's Date: 12/22/2020   History of present illness Jennifer Carrillo is an 89yoF who comes to [facility] on 12/11 from home with weakness adn back/leg pain. Pt admitted with sepsis of unknown source. PMH: DM, HTN, CKD4, CAD, Shingles on 12/05/20 s/p valtrex, CA breast on anastrozole, hyperlipidemia, OSA, discoid lupus. Etiology appears non-infectious per ID, now consulting rheumatology.   OT comments  Jennifer Carrillo presents debilitated today, with generalized weakness and limited endurance. She required Min, greatly increased time, and frequent cueing for bed mobility, Max A for LB dressing, and was unable to come into standing or to clear buttocks from bed. Pt reports pain is better controlled than earlier today or yesterday, and she seemed better oriented to situation. Recommend ongoing OT in hospital, with DC to SNF, given pt's difficulty in performing fxl mobility tasks.   Recommendations for follow up therapy are one component of a multi-disciplinary discharge planning process, led by the attending physician.  Recommendations may be updated based on patient status, additional functional criteria and insurance authorization.    Follow Up Recommendations  Skilled nursing-short term rehab (<3 hours/day)    Assistance Recommended at Discharge Frequent or constant Supervision/Assistance  Equipment Recommendations       Recommendations for Other Services      Precautions / Restrictions Precautions Precautions: Fall Restrictions Weight Bearing Restrictions: No       Mobility Bed Mobility Overal bed mobility: Needs Assistance Bed Mobility: Supine to Sit;Sit to Supine     Supine to sit: Min assist Sit to supine: Min assist   General bed mobility comments: able to scoot along edge of bed with CGA and greatly increased time    Transfers Overall transfer level: Needs assistance Equipment  used: Rolling walker (2 wheels) Transfers: Sit to/from Stand Sit to Stand: Max assist           General transfer comment: Pt unable to come into standing with Max A and frequent cueing     Balance Overall balance assessment: Needs assistance Sitting-balance support: Bilateral upper extremity supported Sitting balance-Leahy Scale: Fair       Standing balance-Leahy Scale: Zero Standing balance comment: Pt unable                           ADL either performed or assessed with clinical judgement   ADL Overall ADL's : Needs assistance/impaired                     Lower Body Dressing: Maximal assistance Lower Body Dressing Details (indicate cue type and reason): donning socks                    Extremity/Trunk Assessment Upper Extremity Assessment RUE Deficits / Details: R UE >LUE   Lower Extremity Assessment Lower Extremity Assessment: Generalized weakness        Vision       Perception     Praxis      Cognition Arousal/Alertness: Awake/alert Behavior During Therapy: WFL for tasks assessed/performed Overall Cognitive Status: History of cognitive impairments - at baseline                                            Exercises Other Exercises Other Exercises: Bed mobility, transfers, LB dressing, repositioning.  Discussion re: POC, role of OT, DC options   Shoulder Instructions       General Comments      Pertinent Vitals/ Pain       Pain Assessment: 0-10 Faces Pain Scale: Hurts a little bit Pain Location: headache Pain Intervention(s): Limited activity within patient's tolerance  Home Living                                          Prior Functioning/Environment              Frequency  Min 2X/week        Progress Toward Goals  OT Goals(current goals can now be found in the care plan section)  Progress towards OT goals: Progressing toward goals  Acute Rehab OT Goals Patient  Stated Goal: to go home OT Goal Formulation: With patient Time For Goal Achievement: 01/04/21 Potential to Achieve Goals: Good  Plan Discharge plan remains appropriate    Co-evaluation                 AM-PAC OT "6 Clicks" Daily Activity     Outcome Measure   Help from another person eating meals?: None Help from another person taking care of personal grooming?: A Little Help from another person toileting, which includes using toliet, bedpan, or urinal?: Total Help from another person bathing (including washing, rinsing, drying)?: Total Help from another person to put on and taking off regular upper body clothing?: A Lot Help from another person to put on and taking off regular lower body clothing?: Total 6 Click Score: 12    End of Session Equipment Utilized During Treatment: Rolling walker (2 wheels)  OT Visit Diagnosis: Unsteadiness on feet (R26.81);Muscle weakness (generalized) (M62.81);Pain   Activity Tolerance Patient tolerated treatment well   Patient Left in bed;with call bell/phone within reach;with bed alarm set   Nurse Communication          Time: 0160-1093 OT Time Calculation (min): 14 min  Charges: OT General Charges $OT Visit: 1 Visit OT Treatments $Self Care/Home Management : 8-22 mins  Josiah Lobo, PhD, MS, OTR/L 12/22/20, 3:54 PM

## 2020-12-22 NOTE — Progress Notes (Signed)
PROGRESS NOTE  VALETTA MULROY ALP:[id number] DOB: 17-Jun-1933 DOA: 12/18/2020 PCP: Jennifer Guise, MD   LOS: 4 days   Brief narrative: Jennifer Carrillo is a 87 y.o. female with past medical history of diabetes mellitus type 2, hypertension, CKD stage IV, coronary artery disease, history of shingles diagnosed on 11/28 treated with Valtrex and gabapentin presented to hospital with 1 day history of diffuse pain extending from her lower back to her legs to the level where she was unable to get out of the bed.  History was limited due to patient's confusion.  In the ED, patient was noted to have a elevated blood pressure of 202/80 with a temperature of 101.8 F and was mildly tachycardic.  WBC initially was 15,000.  Creatinine was 4.1 from baseline of 2.9 with a bicarb of 18 and a normal anion gap.  Chest x-ray did not show any acute findings.  MRI of the lumbar spine did not show any evidence of spinal infection or fracture.  Patient received septic fluid bolus in the ED including broad-spectrum antibiotic and  was then admitted hospital for further evaluation and treatment.     Assessment/Plan:  Principal Problem:   Sepsis ([place]) Active Problems:   PAD (peripheral artery disease) ([place])   Coronary artery disease involving native coronary artery of native heart   Anemia of chronic kidney failure, stage 4 (severe) (HCC)   Acute kidney injury superimposed on CKD IV (HCC)   Metabolic acidosis   Rhabdomyolysis   Hypertensive emergency without congestive heart failure   Hyperglycemia due to type 2 diabetes mellitus ([place])   Shingles 12/05/2020   Severe sepsis present on admission with altered mental status and acute kidney injury:  Likely because polyarthritis.   Presented with fever, tachycardia and leukocytosis.  Recent history of treated shingles.  Procalcitonin normal.  Lactate normal.   Lumbar puncture without evidence of meningitis, antibiotics discontinued including vancomycin, ampicillin and  acyclovir and Rocephin. MRI lumbosacral spine negative for infection.  Urinalysis was unremarkable.  Blood cultures negative so far.   Followed by ID. Suspect polyarthritis with multiple joint pain and swelling.seen by rheumatology.   Bilateral knee aspirate with pseudogout, negative cultures.  Steroid injections.   Will start patient on low-dose prednisone and taper over 1 week.   Bilateral x-ray with extensive osteoarthritis.   Use low-dose tramadol for pain relief.  Altered mental status likely metabolic infective encephalopathy. Adequately improved.  Mental status back to normal today.   Acute kidney injury superimposed on CKD IV with metabolic acidosis Likely prerenal in etiology.  On IV fluids with bicarb.  Continue to  Hold HCTZ.,  Telmisartan.  Creatinine 4.1.  Continue bicarb drip today. Due to significant azotemia, I called and discussed case with his nephrologist for consultation.  Mild cognitive dysfunction with metabolic encephalopathy. At baseline.  Continue delirium precautions.  Resumed Aricept.   Rhabdomyolysis, nontraumatic Mild.  Continue IV fluids with sodium bicarb.  Hold statins.     Hypertensive urgency.  BP high of 233/89 condition.  Has improved with initiation of medication.  We will continue to monitor.  Hyperglycemia due to type 2 diabetes mellitus  Stable today.  On sliding scale.   History of shingles 12/05/2020 Completed Valtrex.   PAD (peripheral artery disease Stable.   Coronary artery disease involving native coronary artery of native heart Continue Ranexa,  isosorbide.  Resume Plavix and statins.   History of breast cancer Continue Arimidex from home.  Generalized weakness at home.  Eval by  PT OT.  Recommended SNF.  DVT prophylaxis: SCDs Start: 12/18/20 2238   Code Status: Full code  Family Communication:  Patient's husband and son at the bedside 12/14.  Status is: Inpatient  Remains inpatient appropriate because: Worsening renal  functions.  Unsafe discharge plan.  Consultants: Infectious disease Nephrology  Procedures: Lumbar puncture Bilateral knee aspirate and steroid injections  Anti-infectives:  Vancomycin,ampicillin and acyclovir 12/11-12/13 Rocephin 12/11--- 12/14  Anti-infectives (From admission, onward)    Start     Dose/Rate Route Frequency Ordered Stop   12/21/20 1200  cefTRIAXone (ROCEPHIN) 2 g in sodium chloride 0.9 % 100 mL IVPB  Status:  Discontinued        2 g 200 mL/hr over 30 Minutes Intravenous Every 24 hours 12/20/20 1805 12/21/20 1901   12/21/20 0200  vancomycin (VANCOREADY) IVPB 750 mg/150 mL  Status:  Discontinued        750 mg 150 mL/hr over 60 Minutes Intravenous Every 48 hours 12/18/20 2312 12/20/20 1416   12/20/20 2200  vancomycin (VANCOREADY) IVPB 750 mg/150 mL  Status:  Discontinued        750 mg 150 mL/hr over 60 Minutes Intravenous Every 48 hours 12/20/20 1416 12/20/20 1805   12/19/20 2200  ceFEPIme (MAXIPIME) 2 g in sodium chloride 0.9 % 100 mL IVPB  Status:  Discontinued        2 g 200 mL/hr over 30 Minutes Intravenous Every 24 hours 12/18/20 2309 12/19/20 1114   12/19/20 2200  cefTRIAXone (ROCEPHIN) 2 g in sodium chloride 0.9 % 100 mL IVPB  Status:  Discontinued        2 g 200 mL/hr over 30 Minutes Intravenous Every 12 hours 12/19/20 1114 12/20/20 1805   12/19/20 1130  ampicillin (OMNIPEN) 2 g in sodium chloride 0.9 % 100 mL IVPB  Status:  Discontinued        2 g 300 mL/hr over 20 Minutes Intravenous Every 12 hours 12/19/20 1116 12/20/20 1805   12/19/20 0200  vancomycin (VANCOCIN) IVPB 1000 mg/200 mL premix       See Hyperspace for full Linked Orders Report.   1,000 mg 200 mL/hr over 60 Minutes Intravenous  Once 12/19/20 0042 12/19/20 0204   12/19/20 0200  vancomycin (VANCOREADY) IVPB 1500 mg/300 mL       See Hyperspace for full Linked Orders Report.   1,500 mg 150 mL/hr over 120 Minutes Intravenous  Once 12/19/20 0042 12/19/20 0453   12/19/20 0130  acyclovir  (ZOVIRAX) 825 mg in dextrose 5 % 150 mL IVPB  Status:  Discontinued        10 mg/kg  82.3 kg (Adjusted) 166.5 mL/hr over 60 Minutes Intravenous Every 24 hours 12/19/20 0044 12/20/20 1825   12/18/20 2330  acyclovir (ZOVIRAX) 825 mg in dextrose 5 % 150 mL IVPB  Status:  Discontinued        10 mg/kg  82.3 kg (Adjusted) 166.5 mL/hr over 60 Minutes Intravenous Every 24 hours 12/18/20 2318 12/19/20 0044   12/18/20 2300  metroNIDAZOLE (FLAGYL) IVPB 500 mg  Status:  Discontinued        500 mg 100 mL/hr over 60 Minutes Intravenous Every 12 hours 12/18/20 2251 12/19/20 1115   12/18/20 2230  vancomycin (VANCOREADY) IVPB 1500 mg/300 mL  Status:  Discontinued       See Hyperspace for full Linked Orders Report.   1,500 mg 150 mL/hr over 120 Minutes Intravenous  Once 12/18/20 2123 12/19/20 0042   12/18/20 2130  ceFEPIme (MAXIPIME) 2 g in  sodium chloride 0.9 % 100 mL IVPB        2 g 200 mL/hr over 30 Minutes Intravenous  Once 12/18/20 2117 12/19/20 0000   12/18/20 2130  metroNIDAZOLE (FLAGYL) IVPB 500 mg  Status:  Discontinued        500 mg 100 mL/hr over 60 Minutes Intravenous  Once 12/18/20 2117 12/18/20 2342   12/18/20 2130  vancomycin (VANCOCIN) IVPB 1000 mg/200 mL premix  Status:  Discontinued        1,000 mg 200 mL/hr over 60 Minutes Intravenous  Once 12/18/20 2117 12/18/20 2123   12/18/20 2130  vancomycin (VANCOCIN) IVPB 1000 mg/200 mL premix  Status:  Discontinued       See Hyperspace for full Linked Orders Report.   1,000 mg 200 mL/hr over 60 Minutes Intravenous  Once 12/18/20 2123 12/19/20 0042       Subjective:  Patient seen and examined.  Denies any complaints.  Afebrile overnight.  Bilateral knee pain persist but better than yesterday.  Was not confident whether she will get up and walk.  No family at bedside in the morning rounds.  Objective: Vitals:   12/22/20 0414 12/22/20 0803  BP: (!) 184/59 (!) 167/55  Pulse: 88 89  Resp: 20 16  Temp: 98.6 F (37 C) 98.7 F (37.1 C)   SpO2: 95% 93%    Intake/Output Summary (Last 24 hours) at 12/22/2020 1242 Last data filed at 12/22/2020 1027 Gross per 24 hour  Intake 1109.92 ml  Output 1550 ml  Net -440.08 ml   Filed Weights   12/18/20 0751 12/18/20 0755 12/19/20 2056  Weight: 82 kg 113.4 kg 80.5 kg   Body mass index is 27.8 kg/m.   Physical Exam:  GENERAL: Looks comfortable.  Alert awake and oriented.  Chronically sick looking and debilitated. HENT: Mucous membranes are moist. NECK: is supple, no gross swelling noted. CHEST: Clear to auscultation. No crackles or wheezes.   CVS: S1 and S2 heard, no murmur. Regular rate and rhythm.  ABDOMEN: Soft, non-tender, bowel sounds are present. EXTREMITIES: Generalized tenderness mostly along the both knee joints, mildly tender bilateral knee joints.  No erythema or redness.  Data Review: I have personally reviewed the following laboratory data and studies,  CBC: Recent Labs  Lab 12/18/20 0834 12/19/20 0616 12/20/20 0231 12/22/20 0202  WBC 15.2* 19.2* 14.4* 12.9*  NEUTROABS  --   --   --  10.7*  HGB 11.0* 10.3* 9.2* 9.1*  HCT 35.0* 33.3* 28.2* 28.0*  MCV 86.4 84.1 82.5 81.9  PLT 331 337 292 892   Basic Metabolic Panel: Recent Labs  Lab 12/18/20 0834 12/19/20 0616 12/20/20 0231 12/21/20 0241 12/22/20 0202  NA 138 138 139  --  137  K 4.9 4.3 3.8  --  3.4*  CL 110 106 103  --  91*  CO2 18* 21* 27  --  33*  GLUCOSE 219* 207* 108*  --  151*  BUN 46* 46* 49*  --  56*  CREATININE 4.11* 3.64* 3.59* 4.28* 4.10*  CALCIUM 9.0 8.8* 8.2*  --  7.8*  MG  --   --  1.8  --  2.1  PHOS  --   --  3.7  --  5.6*   Liver Function Tests: Recent Labs  Lab 12/18/20 0834 12/19/20 0616  AST 28 36  ALT 15 17  ALKPHOS 91 93  BILITOT 1.0 1.0  PROT 7.0 7.3  ALBUMIN 3.3* 2.9*   No results for input(s): LIPASE, AMYLASE  in the last 168 hours. No results for input(s): AMMONIA in the last 168 hours. Cardiac Enzymes: Recent Labs  Lab 12/18/20 0834 12/20/20 0231   CKTOTAL 804* 286*   BNP (last 3 results) No results for input(s): BNP in the last 8760 hours.  ProBNP (last 3 results) No results for input(s): PROBNP in the last 8760 hours.  CBG: Recent Labs  Lab 12/21/20 1959 12/22/20 0003 12/22/20 0410 12/22/20 0800 12/22/20 1142  GLUCAP 133* 120* 175* 170* 229*   Recent Results (from the past 240 hour(s))  Resp Panel by RT-PCR (Flu A&B, Covid) Nasopharyngeal Swab     Status: None   Collection Time: 12/18/20  5:19 PM   Specimen: Nasopharyngeal Swab; Nasopharyngeal(NP) swabs in vial transport medium  Result Value Ref Range Status   SARS Coronavirus 2 by RT PCR NEGATIVE NEGATIVE Final    Comment: (NOTE) SARS-CoV-2 target nucleic acids are NOT DETECTED.  The SARS-CoV-2 RNA is generally detectable in upper respiratory specimens during the acute phase of infection. The lowest concentration of SARS-CoV-2 viral copies this assay can detect is 138 copies/mL. A negative result does not preclude SARS-Cov-2 infection and should not be used as the sole basis for treatment or other patient management decisions. A negative result may occur with  improper specimen collection/handling, submission of specimen other than nasopharyngeal swab, presence of viral mutation(s) within the areas targeted by this assay, and inadequate number of viral copies(<138 copies/mL). A negative result must be combined with clinical observations, patient history, and epidemiological information. The expected result is Negative.  Fact Sheet for Patients:  EntrepreneurPulse.com.au  Fact Sheet for Healthcare Providers:  IncredibleEmployment.be  This test is no t yet approved or cleared by the [place] FDA and  has been authorized for detection and/or diagnosis of SARS-CoV-2 by FDA under an Emergency Use Authorization (EUA). This EUA will remain  in effect (meaning this test can be used) for the duration of the COVID-19  declaration under Section 564(b)(1) of the Act, 21 U.S.C.section 360bbb-3(b)(1), unless the authorization is terminated  or revoked sooner.       Influenza A by PCR NEGATIVE NEGATIVE Final   Influenza B by PCR NEGATIVE NEGATIVE Final    Comment: (NOTE) The Xpert Xpress SARS-CoV-2/FLU/RSV plus assay is intended as an aid in the diagnosis of influenza from Nasopharyngeal swab specimens and should not be used as a sole basis for treatment. Nasal washings and aspirates are unacceptable for Xpert Xpress SARS-CoV-2/FLU/RSV testing.  Fact Sheet for Patients: EntrepreneurPulse.com.au  Fact Sheet for Healthcare Providers: IncredibleEmployment.be  This test is not yet approved or cleared by the [place] FDA and has been authorized for detection and/or diagnosis of SARS-CoV-2 by FDA under an Emergency Use Authorization (EUA). This EUA will remain in effect (meaning this test can be used) for the duration of the COVID-19 declaration under Section 564(b)(1) of the Act, 21 U.S.C. section 360bbb-3(b)(1), unless the authorization is terminated or revoked.  Performed at [facility], [address], [place], [place] [address]   Urine Culture     Status: Abnormal   Collection Time: 12/18/20  5:30 PM   Specimen: Urine, Clean Catch  Result Value Ref Range Status   Specimen Description   Final    URINE, CLEAN CATCH Performed at [facility], [address], [place], [place] [address]    Special Requests   Final    NONE Performed at [facility], [address], [place], [place] [address]    Culture (  A)  Final    <10,000 COLONIES/mL INSIGNIFICANT GROWTH Performed at [facility] [address] [place], [place] [address]    Report Status 12/19/2020 FINAL  Final  Blood culture (routine x 2)     Status: None (Preliminary result)   Collection Time: 12/18/20 10:30 PM   Specimen: BLOOD  Result Value Ref Range Status    Specimen Description BLOOD BLOOD RIGHT HAND  Final   Special Requests   Final    BOTTLES DRAWN AEROBIC AND ANAEROBIC Blood Culture adequate volume   Culture   Final    NO GROWTH 4 DAYS Performed at [facility], [address], [place], [place] [address]    Report Status PENDING  Incomplete  Blood culture (routine x 2)     Status: None (Preliminary result)   Collection Time: 12/18/20 10:35 PM   Specimen: BLOOD  Result Value Ref Range Status   Specimen Description BLOOD BLOOD LEFT HAND  Final   Special Requests   Final    BOTTLES DRAWN AEROBIC AND ANAEROBIC Blood Culture adequate volume   Culture   Final    NO GROWTH 4 DAYS Performed at [facility], [address] [place], [place] [address]    Report Status PENDING  Incomplete  VZV PCR, CSF     Status: None   Collection Time: 12/19/20 10:52 AM   Specimen: Cerebrospinal Fluid  Result Value Ref Range Status   VZV PCR, CSF Negative Negative Final    Comment: (NOTE) No Varicella Zoster Virus DNA detected. Performed At: [facility] [place], [place] [id number] Rush Farmer MD XN:[id number]   CSF culture w Gram Stain     Status: None (Preliminary result)   Collection Time: 12/20/20  9:45 AM   Specimen: PATH Cytology CSF; Cerebrospinal Fluid  Result Value Ref Range Status   Specimen Description   Final    CSF Performed at [facility], [address] [place], [place] [address]    Special Requests   Final    NONE Performed at [facility], [address] [place], [place] [address]    Gram Stain   Final    WBC SEEN NO RBC SEEN NO ORGANISMS SEEN Performed at [facility], [address], [place], [place] [address]    Culture   Final    NO GROWTH 2 DAYS Performed at [facility], [place] [address] [place],  [address]    Report Status PENDING  Incomplete     Studies: DG Knee 1-2 Views Left  Result Date: 12/21/2020 CLINICAL DATA:   Bilateral knee pain. EXAM: LEFT KNEE - 1-2 VIEW COMPARISON:  None. FINDINGS: No fracture or dislocation is noted. Small suprapatellar joint effusion is noted. Moderate narrowing of the medial and lateral joint spaces are noted. IMPRESSION: Moderate degenerative joint disease. Small suprapatellar joint effusion. No fracture or dislocation is noted. Electronically Signed   By: Marijo Conception M.D.   On: 12/21/2020 15:41   DG Knee 1-2 Views Right  Result Date: 12/21/2020 CLINICAL DATA:  Bilateral knee pain. EXAM: RIGHT KNEE - 1-2 VIEW COMPARISON:  None. FINDINGS: No fracture or dislocation is noted. Moderate size suprapatellar joint effusion is noted. Severe narrowing of lateral joint space is noted. Mild degenerative changes noted medially. IMPRESSION: Moderate size suprapatellar joint effusion. Severe degenerative joint disease is noted laterally. No fracture or dislocation is noted. Electronically Signed   By: Marijo Conception M.D.   On:  12/21/2020 15:42    Total time spent: 30 minutes  Barb Merino, MD  [facility] 12/22/2020  If 7PM-7AM, please contact night-coverage

## 2020-12-22 NOTE — TOC Progression Note (Addendum)
Transition of Care [facility]) - Progression Note    Patient Details  Name: TROY HARTZOG MRN: [id number] Date of Birth: 1933-09-26  Transition of Care [facility]) CM/SW [place], LCSW Phone Number: 12/22/2020, 9:32 AM  Clinical Narrative:  No bed offers this morning. [facility], [facility], and [facility] are still pending. Left daughter a voicemail to notify and see if she had a preference between the three.   2:29 pm: Daughter's preferences are [facility] or [facility]. She asked about potential for home health. Discussed with PT and it appears patient is very far from her baseline. Asked Radiation protection practitioner and [facility] admissions coordinators to review referral.  Expected Discharge Plan: [place] Barriers to Discharge: Continued Medical Work up  Expected Discharge Plan and Services Expected Discharge Plan: [place]   Discharge Planning Services: CM Consult Post Acute Care Choice: [place] arrangements for the past 2 months: Single Family Home                 DME Arranged: N/A DME Agency: NA                   Social Determinants of Health (SDOH) Interventions    Readmission Risk Interventions Readmission Risk Prevention Plan 12/19/2020  Transportation Screening Complete  Medication Review Press photographer) Complete  PCP or Specialist appointment within 3-5 days of discharge Complete  HRI or Home Care Consult Complete  SW Recovery Care/Counseling Consult Complete  Palliative Care Screening Not [place] Complete  Some recent data might be hidden

## 2020-12-22 NOTE — Progress Notes (Signed)
Date of Admission:  12/18/2020   l   ID: Jennifer Carrillo is a 87 y.o. female    Principal Problem:   Sepsis ([place]) Active Problems:   PAD (peripheral artery disease) ([place])   Coronary artery disease involving native coronary artery of native heart   Anemia of chronic kidney failure, stage 4 (severe) (HCC)   Acute kidney injury superimposed on CKD IV (HCC)   Metabolic acidosis   Rhabdomyolysis   Hypertensive emergency without congestive heart failure   Hyperglycemia due to type 2 diabetes mellitus ([place])   Shingles 12/05/2020    Subjective: Pt says she has pain rt wrist, rt knee, b/l feet, rt buttock pain is better Was seen by rheumatologist and had aspiration of the knee   Medications:   amLODipine  10 mg Oral Daily   anastrozole  1 mg Oral QPC supper   atorvastatin  40 mg Oral Daily   clopidogrel  75 mg Oral Daily   digoxin  0.0625 mg Oral Q48H   donepezil  10 mg Oral QHS   heparin injection (subcutaneous)  5,000 Units Subcutaneous Q8H   hydrALAZINE  100 mg Oral Q8H   insulin aspart  0-15 Units Subcutaneous Q4H   isosorbide mononitrate  60 mg Oral Daily   mometasone-formoterol  2 puff Inhalation BID   predniSONE  20 mg Oral Q breakfast   ranolazine  500 mg Oral Daily    Objective: Vital signs in last 24 hours: Temp:  [98 F (36.7 C)-99.2 F (37.3 C)] 98.7 F (37.1 C) (12/15 0803) Pulse Rate:  [85-89] 89 (12/15 0803) Resp:  [16-20] 16 (12/15 0803) BP: (159-184)/(51-59) 167/55 (12/15 0803) SpO2:  [93 %-96 %] 93 % (12/15 0803)  PHYSICAL EXAM:  General: Alert, cooperative, no distress  , appears stated age. Oriented x 5 Lungs: Clear to auscultation bilaterally. No Wheezing or Rhonchi. No rales. Heart: Regular rate and rhythm, no murmur, rub or gallop. Abdomen: Soft, non-tender,not distended. Bowel sounds normal. No masses Extremities: rt wrist swollen and tender, rt knee swollen and tender- rt ischial tuberosity  tenderness Less tender on squeezing her  feet Skin: No rashes or lesions. Or bruising Lymph: Cervical, supraclavicular normal. Neurologic: Grossly non-focal  Lab Results Recent Labs    12/20/20 0231 12/21/20 0241 12/22/20 0202  WBC 14.4*  --  12.9*  HGB 9.2*  --  9.1*  HCT 28.2*  --  28.0*  NA 139  --  137  K 3.8  --  3.4*  CL 103  --  91*  CO2 27  --  33*  BUN 49*  --  56*  CREATININE 3.59* 4.28* 4.10*    Microbiology: BC - NG CSF culture NG Studies/Results: DG Knee 1-2 Views Left  Result Date: 12/21/2020 CLINICAL DATA:  Bilateral knee pain. EXAM: LEFT KNEE - 1-2 VIEW COMPARISON:  None. FINDINGS: No fracture or dislocation is noted. Small suprapatellar joint effusion is noted. Moderate narrowing of the medial and lateral joint spaces are noted. IMPRESSION: Moderate degenerative joint disease. Small suprapatellar joint effusion. No fracture or dislocation is noted. Electronically Signed   By: Marijo Conception M.D.   On: 12/21/2020 15:41   DG Knee 1-2 Views Right  Result Date: 12/21/2020 CLINICAL DATA:  Bilateral knee pain. EXAM: RIGHT KNEE - 1-2 VIEW COMPARISON:  None. FINDINGS: No fracture or dislocation is noted. Moderate size suprapatellar joint effusion is noted. Severe narrowing of lateral joint space is noted. Mild degenerative changes noted medially. IMPRESSION: Moderate size suprapatellar  joint effusion. Severe degenerative joint disease is noted laterally. No fracture or dislocation is noted. Electronically Signed   By: Marijo Conception M.D.   On: 12/21/2020 15:42     Assessment/Plan: Acute asymmetrical polyarthritis with swelling, due to CPPD Inflammatory polyarthritis-- seen by rheumatologist aspiration of the rt knee joint for crystals, cell count shows CPPD  Encephalopathy- has resolved completely-  CSF normal.   very minimally elevated protein.  Not significant.  She does not have any evidence clinically and by CSF of meningitis or encephalitis. all antibiotics stopped   Hypertensive emergency- BP  better controlled  AKI on CKD- worsening Recently treated herpes zoster on the left side under the breast.  The lesions are all healed.     History of left breast CA on anastrozole  Anemia   Discussed the management with patient,and care team ID will sign off-call if needed

## 2020-12-22 NOTE — Progress Notes (Signed)
Physical Therapy Treatment Patient Details Name: Jennifer Carrillo MRN: [id number] DOB: 06-Nov-1933 Today's Date: 12/22/2020   History of Present Illness Jennifer Carrillo is an 4yoF who comes to [facility] on 12/11 from home with weakness adn back/leg pain. Pt admitted with sepsis of unknown source. PMH: DM, HTN, CKD4, CAD, Shingles on 12/05/20 s/p valtrex, CA breast on anastrozole, hyperlipidemia, OSA, discoid lupus. Etiology appears non-infectious per ID, now consulting rheumatology.    PT Comments    Pt in bed awake this morning, trying to roll out of bed to left with bed rail, not making much progress due to weakness. Pt disoriented in time, doesn't know it is morning, thinks shes at home, confabulating memories from activity yesterday, trying to convince author pt cleaned house all day yesterday. Attempt to reorient unsuccessful due ot poor retention. Pt cued to scoot up in bed, then bed turned into a chair. Pt walked throughout some bed level leg exercises, still has a great deal of pain on left knee that creates anxiety and detracts from motivation. Pt set up with breakfast which she does not like at baseline, hence author assisted with call to kitchen to order foods that she is fond of. Author puts tele electrodes and leads back in place, helps put purewick back in place. Pt left up, all needs met, sipping milk and coffee, waiting for cheesy eggs, toast, and grits to arrive. Alarm armed. Windows and lights transformed into daytime mode. Pt remains painful and weak, has poor awareness of deficits.     Recommendations for follow up therapy are one component of a multi-disciplinary discharge planning process, led by the attending physician.  Recommendations may be updated based on patient status, additional functional criteria and insurance authorization.  Follow Up Recommendations  Skilled nursing-short term rehab (<3 hours/day)     Assistance Recommended at Discharge Set up [place] Recommendations  None recommended by PT    Recommendations for Other Services       Precautions / Restrictions Precautions Precautions: Fall Precaution Comments: very disoriented today Restrictions Weight Bearing Restrictions: No     Mobility  Bed Mobility Overal bed mobility: Needs Assistance (largely deferred due to inability to orient, constant confusion, disorientation) Bed Mobility:  (able to scoot toward [facility] with repeated cues in flat bed, but needs constant redirection)                Transfers                        Ambulation/Gait                   Stairs             Wheelchair Mobility    Modified Rankin (Stroke Patients Only)       Balance                                            Cognition Arousal/Alertness: Awake/alert Behavior During Therapy: Restless;Impulsive (very disoriented to place, time, and situation, unable to successfully orient due to ST memory difficulty) Overall Cognitive Status: History of cognitive impairments - at baseline  Exercises General Exercises - Lower Extremity Short Arc Quad: AROM;AAROM;Both;20 reps;10 reps;Seated;Limitations Short Arc Quad Limitations: 2 sets; Left side painful and anxiety provoking Hip Flexion/Marching: AAROM;AROM;Both;10 reps;Seated    General Comments        Pertinent Vitals/Pain Faces Pain Scale: Hurts even more Pain Location: Left knee Pain Descriptors / Indicators: Aching;Discomfort Pain Intervention(s): Limited activity within patient's tolerance;Monitored during session;Premedicated before session    Home Living                          Prior Function            PT Goals (current goals can now be found in the care plan section) Acute Rehab PT Goals Patient Stated Goal: less pain, more strong PT Goal Formulation: With patient Time For Goal Achievement:  01/04/21 Potential to Achieve Goals: Fair Progress towards PT goals: Not progressing toward goals - comment    Frequency    Min 2X/week      PT Plan Current plan remains appropriate    Co-evaluation              AM-PAC PT "6 Clicks" Mobility   Outcome Measure  Help needed turning from your back to your side while in a flat bed without using bedrails?: Total Help needed moving from lying on your back to sitting on the side of a flat bed without using bedrails?: Total Help needed moving to and from a bed to a chair (including a wheelchair)?: Total Help needed standing up from a chair using your arms (e.g., wheelchair or bedside chair)?: Total Help needed to walk in hospital room?: Total Help needed climbing 3-5 steps with a railing? : Total 6 Click Score: 6    End of Session Equipment Utilized During Treatment: Gait belt Activity Tolerance: Other (comment) (cognition, inability to consistently follow commands or retain information in session.) Patient left: in bed;with nursing/sitter in room;with call bell/phone within reach   PT Visit Diagnosis: Unsteadiness on feet (R26.81);Difficulty in walking, not elsewhere classified (R26.2);Other abnormalities of gait and mobility (R26.89);Muscle weakness (generalized) (M62.81)     Time: 0045-9977 PT Time Calculation (min) (ACUTE ONLY): 25 min  Charges:  $Therapeutic Exercise: 8-22 mins $Therapeutic Activity: 8-22 mins                    9:27 AM, 12/22/20 Jennifer Carrillo, PT, DPT Physical Therapist - [facility]  [phone] ([place])    [place] C 12/22/2020, 9:23 AM

## 2020-12-22 NOTE — Progress Notes (Addendum)
[facility]  ROUNDING NOTE   Subjective:   Jennifer Carrillo is a 87 y.o. female with past medical concerns including hypertension, diabetes, CAD, shingles, breast CA, and chronic kidney disease stage 4. She presented to the ed with pain extending from her back to her legs and was not able to get out of bed. Patient was admitted for Bilateral leg pain [M79.604, M79.605] Sepsis ([place]) [A41.9] Acute bilateral low back pain, unspecified whether sciatica present [M54.50] Sepsis without acute organ dysfunction, due to unspecified organism [facility]) [A41.9]  Patient is currently known to our office and is followed by Dr Candiss Norse. She was last seen in the office on 12/12/20. We have been consulted today to evaluate worsening kidney function. Patient states she has had this pain since having shingles. She denies nausea and vomiting. States pain restricted her the bed and she was not able to eat and drink as she normally would. Creatinine 4.1 with GFR 10. Recorded urine output of 1.5L in past 24 hours.    Objective:  Vital signs in last 24 hours:  Temp:  [98 F (36.7 C)-98.7 F (37.1 C)] 98.2 F (36.8 C) (12/15 1518) Pulse Rate:  [83-89] 83 (12/15 1518) Resp:  [16-20] 16 (12/15 1518) BP: (150-184)/(50-59) 150/50 (12/15 1518) SpO2:  [93 %-97 %] 97 % (12/15 1518)  Weight change:  Filed Weights   12/18/20 0751 12/18/20 0755 12/19/20 2056  Weight: 82 kg 113.4 kg 80.5 kg    Intake/Output: I/O last 3 completed shifts: In: 2344 [P.O.:480; I.V.:1764; IV Piggyback:100] Out: 1850 [Urine:1850]   Intake/Output this shift:  Total I/O In: 480 [P.O.:480] Out: -   Physical Exam: General: NAD, resting quietly  Head: Normocephalic, atraumatic. Moist oral mucosal membranes  Eyes: Anicteric  Lungs:  Clear to auscultation, normal effort  Heart: Regular rate and rhythm  Abdomen:  Soft, nontender, nondistended  Extremities:  no peripheral edema.  Neurologic: Nonfocal, moving all four extremities   Skin: No lesions       Basic Metabolic Panel: Recent Labs  Lab 12/18/20 0834 12/19/20 0616 12/20/20 0231 12/21/20 0241 12/22/20 0202  NA 138 138 139  --  137  K 4.9 4.3 3.8  --  3.4*  CL 110 106 103  --  91*  CO2 18* 21* 27  --  33*  GLUCOSE 219* 207* 108*  --  151*  BUN 46* 46* 49*  --  56*  CREATININE 4.11* 3.64* 3.59* 4.28* 4.10*  CALCIUM 9.0 8.8* 8.2*  --  7.8*  MG  --   --  1.8  --  2.1  PHOS  --   --  3.7  --  5.6*    Liver Function Tests: Recent Labs  Lab 12/18/20 0834 12/19/20 0616  AST 28 36  ALT 15 17  ALKPHOS 91 93  BILITOT 1.0 1.0  PROT 7.0 7.3  ALBUMIN 3.3* 2.9*   No results for input(s): LIPASE, AMYLASE in the last 168 hours. No results for input(s): AMMONIA in the last 168 hours.  CBC: Recent Labs  Lab 12/18/20 0834 12/19/20 0616 12/20/20 0231 12/22/20 0202  WBC 15.2* 19.2* 14.4* 12.9*  NEUTROABS  --   --   --  10.7*  HGB 11.0* 10.3* 9.2* 9.1*  HCT 35.0* 33.3* 28.2* 28.0*  MCV 86.4 84.1 82.5 81.9  PLT 331 337 292 347    Cardiac Enzymes: Recent Labs  Lab 12/18/20 0834 12/20/20 0231  CKTOTAL 804* 286*    BNP: Invalid input(s): POCBNP  CBG: Recent  Labs  Lab 12/21/20 1959 12/22/20 0003 12/22/20 0410 12/22/20 0800 12/22/20 1142  GLUCAP 133* 120* 175* 170* 229*    Microbiology: Results for orders placed or performed during the hospital encounter of 12/18/20  Resp Panel by RT-PCR (Flu A&B, Covid) Nasopharyngeal Swab     Status: None   Collection Time: 12/18/20  5:19 PM   Specimen: Nasopharyngeal Swab; Nasopharyngeal(NP) swabs in vial transport medium  Result Value Ref Range Status   SARS Coronavirus 2 by RT PCR NEGATIVE NEGATIVE Final    Comment: (NOTE) SARS-CoV-2 target nucleic acids are NOT DETECTED.  The SARS-CoV-2 RNA is generally detectable in upper respiratory specimens during the acute phase of infection. The lowest concentration of SARS-CoV-2 viral copies this assay can detect is 138 copies/mL. A negative  result does not preclude SARS-Cov-2 infection and should not be used as the sole basis for treatment or other patient management decisions. A negative result may occur with  improper specimen collection/handling, submission of specimen other than nasopharyngeal swab, presence of viral mutation(s) within the areas targeted by this assay, and inadequate number of viral copies(<138 copies/mL). A negative result must be combined with clinical observations, patient history, and epidemiological information. The expected result is Negative.  Fact Sheet for Patients:  EntrepreneurPulse.com.au  Fact Sheet for Healthcare Providers:  IncredibleEmployment.be  This test is no t yet approved or cleared by the [place] FDA and  has been authorized for detection and/or diagnosis of SARS-CoV-2 by FDA under an Emergency Use Authorization (EUA). This EUA will remain  in effect (meaning this test can be used) for the duration of the COVID-19 declaration under Section 564(b)(1) of the Act, 21 U.S.C.section 360bbb-3(b)(1), unless the authorization is terminated  or revoked sooner.       Influenza A by PCR NEGATIVE NEGATIVE Final   Influenza B by PCR NEGATIVE NEGATIVE Final    Comment: (NOTE) The Xpert Xpress SARS-CoV-2/FLU/RSV plus assay is intended as an aid in the diagnosis of influenza from Nasopharyngeal swab specimens and should not be used as a sole basis for treatment. Nasal washings and aspirates are unacceptable for Xpert Xpress SARS-CoV-2/FLU/RSV testing.  Fact Sheet for Patients: EntrepreneurPulse.com.au  Fact Sheet for Healthcare Providers: IncredibleEmployment.be  This test is not yet approved or cleared by the [place] FDA and has been authorized for detection and/or diagnosis of SARS-CoV-2 by FDA under an Emergency Use Authorization (EUA). This EUA will remain in effect (meaning this test can be used)  for the duration of the COVID-19 declaration under Section 564(b)(1) of the Act, 21 U.S.C. section 360bbb-3(b)(1), unless the authorization is terminated or revoked.  Performed at [facility], [address], [place], [place] [address]   Urine Culture     Status: Abnormal   Collection Time: 12/18/20  5:30 PM   Specimen: Urine, Clean Catch  Result Value Ref Range Status   Specimen Description   Final    URINE, CLEAN CATCH Performed at [facility], [address] [place], [place] [address]    Special Requests   Final    NONE Performed at [facility], [address] [place], [place] [address]    Culture (A)  Final    <10,000 COLONIES/mL INSIGNIFICANT GROWTH Performed at [facility] [address], [place], [place] [address]    Report Status 12/19/2020 FINAL  Final  Blood culture (routine x 2)     Status: None (Preliminary result)   Collection Time: 12/18/20 10:30 PM   Specimen:  BLOOD  Result Value Ref Range Status   Specimen Description BLOOD BLOOD RIGHT HAND  Final   Special Requests   Final    BOTTLES DRAWN AEROBIC AND ANAEROBIC Blood Culture adequate volume   Culture   Final    NO GROWTH 4 DAYS Performed at [facility], [address], [place], [place] [address]    Report Status PENDING  Incomplete  Blood culture (routine x 2)     Status: None (Preliminary result)   Collection Time: 12/18/20 10:35 PM   Specimen: BLOOD  Result Value Ref Range Status   Specimen Description BLOOD BLOOD LEFT HAND  Final   Special Requests   Final    BOTTLES DRAWN AEROBIC AND ANAEROBIC Blood Culture adequate volume   Culture   Final    NO GROWTH 4 DAYS Performed at [facility], [address] [place], [place] [address]    Report Status PENDING  Incomplete  VZV PCR, CSF     Status: None   Collection Time: 12/19/20 10:52 AM   Specimen: Cerebrospinal Fluid  Result Value Ref Range Status   VZV PCR, CSF Negative Negative  Final    Comment: (NOTE) No Varicella Zoster Virus DNA detected. Performed At: [facility] [place], [place] [id number] Rush Farmer MD [id number]   CSF culture w Gram Stain     Status: None (Preliminary result)   Collection Time: 12/20/20  9:45 AM   Specimen: PATH Cytology CSF; Cerebrospinal Fluid  Result Value Ref Range Status   Specimen Description   Final    CSF Performed at [facility], [address], [place], [place] [address]    Special Requests   Final    NONE Performed at [facility], [address] [place], [place] [address]    Gram Stain   Final    WBC SEEN NO RBC SEEN NO ORGANISMS SEEN Performed at [facility], [address], [place], [place] [address]    Culture   Final    NO GROWTH 2 DAYS Performed at [facility], [place] [address], [place], [place] [address]    Report Status PENDING  Incomplete    Coagulation Studies: No results for input(s): LABPROT, INR in the last 72 hours.  Urinalysis: No results for input(s): COLORURINE, LABSPEC, PHURINE, GLUCOSEU, HGBUR, BILIRUBINUR, KETONESUR, PROTEINUR, UROBILINOGEN, NITRITE, LEUKOCYTESUR in the last 72 hours.  Invalid input(s): APPERANCEUR    Imaging: DG Knee 1-2 Views Left  Result Date: 12/21/2020 CLINICAL DATA:  Bilateral knee pain. EXAM: LEFT KNEE - 1-2 VIEW COMPARISON:  None. FINDINGS: No fracture or dislocation is noted. Small suprapatellar joint effusion is noted. Moderate narrowing of the medial and lateral joint spaces are noted. IMPRESSION: Moderate degenerative joint disease. Small suprapatellar joint effusion. No fracture or dislocation is noted. Electronically Signed   By: Marijo Conception M.D.   On: 12/21/2020 15:41   DG Knee 1-2 Views Right  Result Date: 12/21/2020 CLINICAL DATA:  Bilateral knee pain. EXAM: RIGHT KNEE - 1-2 VIEW COMPARISON:  None. FINDINGS: No fracture or dislocation is noted. Moderate size suprapatellar joint  effusion is noted. Severe narrowing of lateral joint space is noted. Mild degenerative changes noted medially. IMPRESSION: Moderate size suprapatellar joint effusion. Severe degenerative joint disease is noted laterally. No fracture or dislocation is noted. Electronically Signed   By: Marijo Conception M.D.   On: 12/21/2020 15:42     Medications:     amLODipine  10 mg  Oral Daily   anastrozole  1 mg Oral QPC supper   atorvastatin  40 mg Oral Daily   clopidogrel  75 mg Oral Daily   digoxin  0.0625 mg Oral Q48H   donepezil  10 mg Oral QHS   heparin injection (subcutaneous)  5,000 Units Subcutaneous Q8H   hydrALAZINE  100 mg Oral Q8H   insulin aspart  0-15 Units Subcutaneous Q4H   isosorbide mononitrate  60 mg Oral Daily   mometasone-formoterol  2 puff Inhalation BID   predniSONE  20 mg Oral Q breakfast   ranolazine  500 mg Oral Daily   acetaminophen **OR** acetaminophen, docusate sodium, hydrALAZINE, labetalol, ondansetron **OR** ondansetron (ZOFRAN) IV, traMADol  Assessment/ Plan:  Ms. Jennifer Carrillo is a 87 y.o.  female with past medical concerns including hypertension, diabetes, CAD, shingles, breast CA, and chronic kidney disease stage 4. She presented to the ed with pain extending from her back to her legs and was not able to get out of bed. Patient was admitted for Bilateral leg pain [M79.604, M79.605] Sepsis ([place]) [A41.9] Acute bilateral low back pain, unspecified whether sciatica present [M54.50] Sepsis without acute organ dysfunction, due to unspecified organism [facility]) [A41.9]   Acute Kidney Injury on chronic kidney disease stage V with baseline creatinine 3.83 and GFR of 11 on 11/26/20.  Acute kidney injury secondary to dehydration and rhabdo No IV contrast exposure.  Telmisartan held.  Will obtain renal ultrasound to evaluate obstruction.  Patient has continued to have worsening renal function seen in outpatient visits as well. D/c IVF. Discussed with patient the need to begin  planning for dialysis initiation.  Patient agreeable.  Will order nondominant vein mapping for access. Discussed with patient the different modalities of renal replacement therapy.  Patient interested in peritoneal dialysis.  We will begin this process after this admission.  We will schedule patient for follow-up in our office.  Lab Results  Component Value Date   CREATININE 4.10 (H) 12/22/2020   CREATININE 4.28 (H) 12/21/2020   CREATININE 3.59 (H) 12/20/2020    Intake/Output Summary (Last 24 hours) at 12/22/2020 1601 Last data filed at 12/22/2020 1419 Gross per 24 hour  Intake 1109.92 ml  Output 900 ml  Net 209.92 ml   2. Anemia of chronic kidney disease Lab Results  Component Value Date   HGB 9.1 (L) 12/22/2020    Hemoglobin within desired goal.  We will continue to monitor  3. Secondary Hyperparathyroidism: with outpatient labs:phosphorus 3.5, calcium 9.3 on 11/07/20.   Lab Results  Component Value Date   PTH 35 06/27/2018   CALCIUM 7.8 (L) 12/22/2020   PHOS 5.6 (H) 12/22/2020    Calcium and phosphorus within acceptable range.  We will continue to monitor bone minerals during this admission  4. Diabetes mellitus type II with chronic kidney disease insulin dependent. Home regimen includes NPH. Most recent hemoglobin A1c is 5.3 on 12/18/20.  Glucose elevated  5.  Hypertension with chronic kidney disease.  Home regimen includes hydrochlorothiazide, isosorbide, telmisartan, amlodipine, and hydralazine.  Telmisartan and HCTZ currently held.  BP currently 150/50   LOS: 4   12/15/20224:01 PM

## 2020-12-23 LAB — BASIC METABOLIC PANEL
Anion gap: 14 (ref 5–15)
BUN: 84 mg/dL — ABNORMAL HIGH (ref 8–23)
CO2: 29 mmol/L (ref 22–32)
Calcium: 7.8 mg/dL — ABNORMAL LOW (ref 8.9–10.3)
Chloride: 90 mmol/L — ABNORMAL LOW (ref 98–111)
Creatinine, Ser: 4.86 mg/dL — ABNORMAL HIGH (ref 0.44–1.00)
GFR, Estimated: 8 mL/min — ABNORMAL LOW (ref >60)
Glucose, Bld: 218 mg/dL — ABNORMAL HIGH (ref 70–99)
Potassium: 3.3 mmol/L — ABNORMAL LOW (ref 3.5–5.1)
Sodium: 133 mmol/L — ABNORMAL LOW (ref 135–145)

## 2020-12-23 LAB — CSF CULTURE W GRAM STAIN: Culture: NO GROWTH

## 2020-12-23 LAB — GLUCOSE, CAPILLARY
Glucose-Capillary: 211 mg/dL — ABNORMAL HIGH (ref 70–99)
Glucose-Capillary: 179 mg/dL — ABNORMAL HIGH (ref 70–99)
Glucose-Capillary: 194 mg/dL — ABNORMAL HIGH (ref 70–99)
Glucose-Capillary: 262 mg/dL — ABNORMAL HIGH (ref 70–99)
Glucose-Capillary: 249 mg/dL — ABNORMAL HIGH (ref 70–99)

## 2020-12-23 LAB — DIGOXIN LEVEL: Digoxin Level: 0.9 ng/mL (ref 0.8–2.0)

## 2020-12-23 MED ORDER — MORPHINE SULFATE (PF) 2 MG/ML IV SOLN
1.0000 mg | Freq: Once | INTRAVENOUS | Status: AC
  Administered 2020-12-23: 1 mg via INTRAVENOUS
  Filled 2020-12-23: qty 1

## 2020-12-23 MED ORDER — SENNA 8.6 MG PO TABS
1.0000 | ORAL_TABLET | Freq: Every day | ORAL | Status: DC
  Administered 2020-12-23 – 2021-01-01 (×9): 8.6 mg via ORAL
  Filled 2020-12-23 (×9): qty 1

## 2020-12-23 MED ORDER — DOCUSATE SODIUM 100 MG PO CAPS
100.0000 mg | ORAL_CAPSULE | Freq: Two times a day (BID) | ORAL | Status: DC
  Administered 2020-12-23 – 2021-01-01 (×19): 100 mg via ORAL
  Filled 2020-12-23 (×19): qty 1

## 2020-12-23 MED ORDER — MINERAL OIL RE ENEM: 1.0000 | ENEMA | Freq: Once | RECTAL | Status: DC

## 2020-12-23 MED ORDER — SORBITOL 70 % SOLN
960.0000 mL | TOPICAL_OIL | Freq: Once | ORAL | Status: AC
  Administered 2020-12-23: 960 mL via RECTAL
  Filled 2020-12-23: qty 240

## 2020-12-23 MED ORDER — GLYCERIN (LAXATIVE) 2 G RE SUPP
1.0000 | Freq: Once | RECTAL | Status: DC
  Filled 2020-12-23: qty 1

## 2020-12-23 NOTE — Care Management Important Message (Signed)
Important Message  Patient Details  Name: ULAH OLMO MRN: [id number] Date of Birth: 21-Sep-1933   Medicare Important Message Given:  Yes     Dannette Barbara 12/23/2020, 11:27 AM

## 2020-12-23 NOTE — Progress Notes (Signed)
[facility]  ROUNDING NOTE   Subjective:   Jennifer Carrillo is a 87 y.o. female with past medical concerns including hypertension, diabetes, CAD, shingles, breast CA, and chronic kidney disease stage 4. She presented to the ed with pain extending from her back to her legs and was not able to get out of bed. Patient was admitted for Bilateral leg pain [M79.604, M79.605] Sepsis ([place]) [A41.9] Acute bilateral low back pain, unspecified whether sciatica present [M54.50] Sepsis without acute organ dysfunction, due to unspecified organism [facility]) [A41.9]  Patient is currently known to our office and is followed by Dr Jennifer Carrillo. She was last seen in the office on 12/12/20.   Patient resting in bed Breakfast tray at bedside Tolerating small meals without nausea Denies shortness of breath  Creatinine increased 4.9 (4.1) Recorded urine output of 613ml on day shift yesterday  Objective:  Vital signs in last 24 hours:  Temp:  [98.1 F (36.7 C)-98.9 F (37.2 C)] 98.6 F (37 C) (12/16 0739) Pulse Rate:  [75-83] 75 (12/16 0739) Resp:  [16-20] 20 (12/16 0739) BP: (150-168)/(50-69) 157/55 (12/16 0739) SpO2:  [92 %-100 %] 92 % (12/16 0739)  Weight change:  Filed Weights   12/18/20 0751 12/18/20 0755 12/19/20 2056  Weight: 82 kg 113.4 kg 80.5 kg    Intake/Output: I/O last 3 completed shifts: In: 480 [P.O.:480] Out: 1550 [Urine:1550]   Intake/Output this shift:  No intake/output data recorded.  Physical Exam: General: NAD, resting quietly  Head: Normocephalic, atraumatic. Moist oral mucosal membranes  Eyes: Anicteric  Lungs:  Clear to auscultation, normal effort  Heart: Regular rate and rhythm  Abdomen:  Soft, nontender, nondistended  Extremities:  no peripheral edema.  Neurologic: Nonfocal, moving all four extremities  Skin: No lesions       Basic Metabolic Panel: Recent Labs  Lab 12/18/20 0834 12/19/20 0616 12/20/20 0231 12/21/20 0241 12/22/20 0202 12/23/20 0509   NA 138 138 139  --  137 133*  K 4.9 4.3 3.8  --  3.4* 3.3*  CL 110 106 103  --  91* 90*  CO2 18* 21* 27  --  33* 29  GLUCOSE 219* 207* 108*  --  151* 218*  BUN 46* 46* 49*  --  56* 84*  CREATININE 4.11* 3.64* 3.59* 4.28* 4.10* 4.86*  CALCIUM 9.0 8.8* 8.2*  --  7.8* 7.8*  MG  --   --  1.8  --  2.1  --   PHOS  --   --  3.7  --  5.6*  --      Liver Function Tests: Recent Labs  Lab 12/18/20 0834 12/19/20 0616  AST 28 36  ALT 15 17  ALKPHOS 91 93  BILITOT 1.0 1.0  PROT 7.0 7.3  ALBUMIN 3.3* 2.9*    No results for input(s): LIPASE, AMYLASE in the last 168 hours. No results for input(s): AMMONIA in the last 168 hours.  CBC: Recent Labs  Lab 12/18/20 0834 12/19/20 0616 12/20/20 0231 12/22/20 0202  WBC 15.2* 19.2* 14.4* 12.9*  NEUTROABS  --   --   --  10.7*  HGB 11.0* 10.3* 9.2* 9.1*  HCT 35.0* 33.3* 28.2* 28.0*  MCV 86.4 84.1 82.5 81.9  PLT 331 337 292 347     Cardiac Enzymes: Recent Labs  Lab 12/18/20 0834 12/20/20 0231  CKTOTAL 804* 286*     BNP: Invalid input(s): POCBNP  CBG: Recent Labs  Lab 12/22/20 1626 12/22/20 1929 12/22/20 2337 12/23/20 0448 12/23/20 0737  GLUCAP 210* 217* 205* 211* 179*     Microbiology: Results for orders placed or performed during the hospital encounter of 12/18/20  Resp Panel by RT-PCR (Flu A&B, Covid) Nasopharyngeal Swab     Status: None   Collection Time: 12/18/20  5:19 PM   Specimen: Nasopharyngeal Swab; Nasopharyngeal(NP) swabs in vial transport medium  Result Value Ref Range Status   SARS Coronavirus 2 by RT PCR NEGATIVE NEGATIVE Final    Comment: (NOTE) SARS-CoV-2 target nucleic acids are NOT DETECTED.  The SARS-CoV-2 RNA is generally detectable in upper respiratory specimens during the acute phase of infection. The lowest concentration of SARS-CoV-2 viral copies this assay can detect is 138 copies/mL. A negative result does not preclude SARS-Cov-2 infection and should not be used as the sole basis for  treatment or other patient management decisions. A negative result may occur with  improper specimen collection/handling, submission of specimen other than nasopharyngeal swab, presence of viral mutation(s) within the areas targeted by this assay, and inadequate number of viral copies(<138 copies/mL). A negative result must be combined with clinical observations, patient history, and epidemiological information. The expected result is Negative.  Fact Sheet for Patients:  EntrepreneurPulse.com.au  Fact Sheet for Healthcare Providers:  IncredibleEmployment.be  This test is no t yet approved or cleared by the [place] FDA and  has been authorized for detection and/or diagnosis of SARS-CoV-2 by FDA under an Emergency Use Authorization (EUA). This EUA will remain  in effect (meaning this test can be used) for the duration of the COVID-19 declaration under Section 564(b)(1) of the Act, 21 U.S.C.section 360bbb-3(b)(1), unless the authorization is terminated  or revoked sooner.       Influenza A by PCR NEGATIVE NEGATIVE Final   Influenza B by PCR NEGATIVE NEGATIVE Final    Comment: (NOTE) The Xpert Xpress SARS-CoV-2/FLU/RSV plus assay is intended as an aid in the diagnosis of influenza from Nasopharyngeal swab specimens and should not be used as a sole basis for treatment. Nasal washings and aspirates are unacceptable for Xpert Xpress SARS-CoV-2/FLU/RSV testing.  Fact Sheet for Patients: EntrepreneurPulse.com.au  Fact Sheet for Healthcare Providers: IncredibleEmployment.be  This test is not yet approved or cleared by the [place] FDA and has been authorized for detection and/or diagnosis of SARS-CoV-2 by FDA under an Emergency Use Authorization (EUA). This EUA will remain in effect (meaning this test can be used) for the duration of the COVID-19 declaration under Section 564(b)(1) of the Act, 21  U.S.C. section 360bbb-3(b)(1), unless the authorization is terminated or revoked.  Performed at [facility], [address], [place], [place] [address]   Urine Culture     Status: Abnormal   Collection Time: 12/18/20  5:30 PM   Specimen: Urine, Clean Catch  Result Value Ref Range Status   Specimen Description   Final    URINE, CLEAN CATCH Performed at [facility], [address], [place], [place] [address]    Special Requests   Final    NONE Performed at [facility], [address] [place], [place] [address]    Culture (A)  Final    <10,000 COLONIES/mL INSIGNIFICANT GROWTH Performed at [place] [address], [place], [place] [address]    Report Status 12/19/2020 FINAL  Final  Blood culture (routine x 2)     Status: None (Preliminary result)   Collection Time: 12/18/20 10:30 PM   Specimen: BLOOD  Result Value Ref Range Status   Specimen Description BLOOD BLOOD  RIGHT HAND  Final   Special Requests   Final    BOTTLES DRAWN AEROBIC AND ANAEROBIC Blood Culture adequate volume   Culture   Final    NO GROWTH 4 DAYS Performed at [facility], [address] [place], [place] [address]    Report Status PENDING  Incomplete  Blood culture (routine x 2)     Status: None (Preliminary result)   Collection Time: 12/18/20 10:35 PM   Specimen: BLOOD  Result Value Ref Range Status   Specimen Description BLOOD BLOOD LEFT HAND  Final   Special Requests   Final    BOTTLES DRAWN AEROBIC AND ANAEROBIC Blood Culture adequate volume   Culture   Final    NO GROWTH 4 DAYS Performed at [facility], [address] [place], [place] [address]    Report Status PENDING  Incomplete  VZV PCR, CSF     Status: None   Collection Time: 12/19/20 10:52 AM   Specimen: Cerebrospinal Fluid  Result Value Ref Range Status   VZV PCR, CSF Negative Negative Final    Comment: (NOTE) No Varicella Zoster Virus DNA detected. Performed At: [facility] [place], [place] [id number] Rush Farmer MD YK:[id number]   CSF culture w Gram Stain     Status: None (Preliminary result)   Collection Time: 12/20/20  9:45 AM   Specimen: PATH Cytology CSF; Cerebrospinal Fluid  Result Value Ref Range Status   Specimen Description   Final    CSF Performed at [facility], [address], [place], [place] [address]    Special Requests   Final    NONE Performed at [facility], [address] [place], [place] [address]    Gram Stain   Final    WBC SEEN NO RBC SEEN NO ORGANISMS SEEN Performed at [facility], [address], [place], [place] [address]    Culture   Final    NO GROWTH 3 DAYS Performed at [facility], [place] [address] [place], [place] [address]    Report Status PENDING  Incomplete    Coagulation Studies: No results for input(s): LABPROT, INR in the last 72 hours.  Urinalysis: No results for input(s): COLORURINE, LABSPEC, PHURINE, GLUCOSEU, HGBUR, BILIRUBINUR, KETONESUR, PROTEINUR, UROBILINOGEN, NITRITE, LEUKOCYTESUR in the last 72 hours.  Invalid input(s): APPERANCEUR    Imaging: DG Knee 1-2 Views Left  Result Date: 12/21/2020 CLINICAL DATA:  Bilateral knee pain. EXAM: LEFT KNEE - 1-2 VIEW COMPARISON:  None. FINDINGS: No fracture or dislocation is noted. Small suprapatellar joint effusion is noted. Moderate narrowing of the medial and lateral joint spaces are noted. IMPRESSION: Moderate degenerative joint disease. Small suprapatellar joint effusion. No fracture or dislocation is noted. Electronically Signed   By: Marijo Conception M.D.   On: 12/21/2020 15:41   DG Knee 1-2 Views Right  Result Date: 12/21/2020 CLINICAL DATA:  Bilateral knee pain. EXAM: RIGHT KNEE - 1-2 VIEW COMPARISON:  None. FINDINGS: No fracture or dislocation is noted. Moderate size suprapatellar joint effusion is noted. Severe narrowing of lateral joint space is noted. Mild degenerative  changes noted medially. IMPRESSION: Moderate size suprapatellar joint effusion. Severe degenerative joint disease is noted laterally. No fracture or dislocation is noted. Electronically Signed   By: Marijo Conception M.D.   On: 12/21/2020 15:42   US RENAL  Result Date: 12/22/2020 CLINICAL DATA:  Acute kidney injury, history diabetes mellitus, COPD, discoid lupus, hypertension, coronary artery disease, former smoker,  breast cancer EXAM: RENAL / URINARY TRACT ULTRASOUND COMPLETE COMPARISON:  None FINDINGS: Right Kidney: Renal measurements: 10.1 x 5.0 x 5.4 cm = volume: 140 mL. Normal cortical thickness. Slightly increased cortical echogenicity. Tiny cyst at upper pole 13 x 12 x 12 mm. Small cyst mid kidney 18 x 17 x 16 mm. Tiny cyst mid kidney 11 mm diameter. No solid mass or hydronephrosis. No shadowing calcifications. Left Kidney: Renal measurements: 10.1 x 5.6 x 5.3 cm = volume: 153 mL. Normal cortical thickness. Increased cortical echogenicity. No mass, hydronephrosis, or shadowing calcification. Bladder: Appears normal for degree of bladder distention. LEFT ureteral jet was visualized. Other: N/A IMPRESSION: Medical renal disease changes of both kidneys. Small RIGHT renal cysts. Electronically Signed   By: Lavonia Dana M.D.   On: 12/22/2020 17:20   [place] UE VEIN MAPPING LEFT (PRE-OP AVF)  Result Date: 12/22/2020 CLINICAL DATA:  Preop evaluation prior to AV fistula creation EXAM: [place] EXTREM UP VEIN MAPPING COMPARISON:  None. FINDINGS: LEFT ARTERIES Wrist Radial Artery: Size 44mm Waveform monophasic Wrist Ulnar Artery: Size 42mm Waveform monophasic Prox. Forearm Radial Artery: Size 57mm Waveform triphasic Upper Arm Brachial Artery: Size 74mm Waveform triphasic LEFT VEINS Forearm Cephalic Vein: Prox 49mm Distal 68mm Depth 8-6VH Upper Arm Cephalic Vein: Prox thrombosed distal thrombosed depth 84ON Upper Arm Basilic Vein: Prox 18mm Distal 55mm Depth 14-22mm Upper Arm Brachial Vein: Prox 67mm Distal 4mm Depth 14-36mm  ADDITIONAL LEFT VEINS Axillary Vein:  28mm Subclavian Vein: Patient: Yes Respiratory Phasicity: Present Internal Jugular Vein: Patent: Yes    Respiratory Phasicity: Present IMPRESSION: 1. Monophasic flow seen in the radial and ulnar arteries at the level of the wrist. 2. Upper arm cephalic vein is thrombosed. Electronically Signed   By: Miachel Roux M.D.   On: 12/22/2020 16:26     Medications:     amLODipine  10 mg Oral Daily   anastrozole  1 mg Oral QPC supper   atorvastatin  40 mg Oral Daily   clopidogrel  75 mg Oral Daily   digoxin  0.0625 mg Oral Q48H   docusate sodium  100 mg Oral BID   donepezil  10 mg Oral QHS   heparin injection (subcutaneous)  5,000 Units Subcutaneous Q8H   hydrALAZINE  100 mg Oral Q8H   insulin aspart  0-15 Units Subcutaneous Q4H   isosorbide mononitrate  60 mg Oral Daily   mometasone-formoterol  2 puff Inhalation BID   predniSONE  20 mg Oral Q breakfast   ranolazine  500 mg Oral Daily   senna  1 tablet Oral Daily   acetaminophen **OR** acetaminophen, docusate sodium, hydrALAZINE, labetalol, ondansetron **OR** ondansetron (ZOFRAN) IV, traMADol  Assessment/ Plan:  Ms. ALFRIEDA TARRY is a 87 y.o.  female with past medical concerns including hypertension, diabetes, CAD, shingles, breast CA, and chronic kidney disease stage 4. She presented to the ed with pain extending from her back to her legs and was not able to get out of bed. Patient was admitted for Bilateral leg pain [M79.604, M79.605] Sepsis ([place]) [A41.9] Acute bilateral low back pain, unspecified whether sciatica present [M54.50] Sepsis without acute organ dysfunction, due to unspecified organism [facility]) [A41.9]   Acute Kidney Injury on chronic kidney disease stage V with baseline creatinine 3.83 and GFR of 11 on 11/26/20.  Acute kidney injury secondary to dehydration and rhabdo No IV contrast exposure.  Telmisartan held.    -Renal ultrasound negative for obstruction, does show right renal  cyst.  -Creatinine continues to rise to  4.86 today.  Monitoring urine output.  Recorded 650 mL during day shift yesterday.  -Discussed with patient the continued worsening of renal function.  She is agreeable to dialysis when needed.  -Continuing to monitor renal function and attempt to regain if possible.  Plans to follow-up with the labs and possible dialysis initiation in the near future.  Lab Results  Component Value Date   CREATININE 4.86 (H) 12/23/2020   CREATININE 4.10 (H) 12/22/2020   CREATININE 4.28 (H) 12/21/2020    Intake/Output Summary (Last 24 hours) at 12/23/2020 1132 Last data filed at 12/22/2020 1853 Gross per 24 hour  Intake 240 ml  Output 650 ml  Net -410 ml    2. Anemia of chronic kidney disease Lab Results  Component Value Date   HGB 9.1 (L) 12/22/2020    We will continue to monitor  3. Secondary Hyperparathyroidism: with outpatient labs:phosphorus 3.5, calcium 9.3 on 11/07/20.   Lab Results  Component Value Date   PTH 35 06/27/2018   CALCIUM 7.8 (L) 12/23/2020   PHOS 5.6 (H) 12/22/2020    Current levels are acceptable at this time.  We will continue to monitor bone minerals during this admission  4. Diabetes mellitus type II with chronic kidney disease insulin dependent. Home regimen includes NPH. Most recent hemoglobin A1c is 5.3 on 12/18/20.  Glucose remains elevated.  Primary team to manage sliding scale insulin   5.  Hypertension with chronic kidney disease.  Home regimen includes hydrochlorothiazide, isosorbide, telmisartan, amlodipine, and hydralazine.  Telmisartan and HCTZ currently held.  BP currently 157/55   LOS: 5   12/16/202211:32 AM

## 2020-12-23 NOTE — TOC Progression Note (Addendum)
Transition of Care [facility]) - Progression Note    Patient Details  Name: Jennifer Carrillo MRN: [id number] Date of Birth: [birth year]  Transition of Care [facility]) CM/SW [place], LCSW Phone Number: 12/23/2020, 10:42 AM  Clinical Narrative:  [facility] is unable to offer a bed. [facility] admissions coordinator is checking referral decision.   12:33 pm: [facility] does not have a bed today. Admissions coordinator is checking for Monday.  Expected Discharge Plan: [place] Barriers to Discharge: Continued Medical Work up  Expected Discharge Plan and Services Expected Discharge Plan: [place]   Discharge Planning Services: CM Consult Post Acute Care Choice: [place] arrangements for the past 2 months: Single Family Home                 DME Arranged: N/A DME Agency: NA                   Social Determinants of Health (SDOH) Interventions    Readmission Risk Interventions Readmission Risk Prevention Plan 12/19/2020  Transportation Screening Complete  Medication Review [employer] photographer) Complete  PCP or Specialist appointment within 3-5 days of discharge Complete  HRI or Home Care Consult Complete  SW Recovery Care/Counseling Consult Complete  Palliative Care Screening Not [place] Complete  Some recent data might be hidden

## 2020-12-23 NOTE — Progress Notes (Signed)
PROGRESS NOTE  [name] OFB:[id number] DOB: 01/02/1934 DOA: 12/18/2020 PCP: [name], MD   LOS: 5 days   Brief narrative: Jennifer Carrillo is a 87 y.o. female with past medical history of diabetes mellitus type 2, hypertension, CKD stage IV, coronary artery disease, history of shingles diagnosed on 11/28 treated with Valtrex and gabapentin presented to hospital with 1 day history of diffuse pain extending from her lower back to her legs to the level where she was unable to get out of the bed.  History was limited due to patient's confusion.  In the ED, patient was noted to have a elevated blood pressure of 202/80 with a temperature of 101.8 F and was mildly tachycardic.  WBC initially was 15,000.  Creatinine was 4.1 from baseline of 2.9 with a bicarb of 18 and a normal anion gap.  Chest x-ray did not show any acute findings.  MRI of the lumbar spine did not show any evidence of spinal infection or fracture.  Patient received septic fluid bolus in the ED including broad-spectrum antibiotic and  was then admitted hospital for further evaluation and treatment.     Assessment/Plan:  Principal Problem:   Sepsis ([place]) Active Problems:   PAD (peripheral artery disease) ([place])   Coronary artery disease involving native coronary artery of native heart   Anemia of chronic kidney failure, stage 4 (severe) (HCC)   Acute kidney injury superimposed on CKD IV (H[facility])   Metabolic acidosis   Rhabdomyolysis   Hypertensive emergency without congestive heart failure   Hyperglycemia due to type 2 diabetes mellitus ([place])   Shingles 12/05/2020    Severe sepsis present on admission with altered mental status and acute kidney injury: Likely because polyarthritis.   Presented with fever, tachycardia and leukocytosis.  Recent history of treated shingles.  Procalcitonin normal.  Lactate normal.   Lumbar puncture without evidence of meningitis, antibiotics discontinued  MRI lumbosacral spine negative for  infection.  Urinalysis was unremarkable.  Blood cultures negative so far.   Followed by ID. Suspect polyarthritis with multiple joint pain and swelling. seen by rheumatology.   Bilateral knee aspirate with pseudogout, negative cultures.  Steroid injections.   Started patient on low-dose prednisone and taper over 1 week.   Bilateral x-ray with extensive osteoarthritis.   Use low-dose tramadol for pain relief.  Altered mental status likely metabolic infective encephalopathy. Adequately improved.  Mental status improved.   Acute kidney injury superimposed on CKD IV with metabolic acidosis Progressive azotemia.  Urine output 650 mL overnight. Renal ultrasound with no evidence of obstruction. Creatinine continues to rise 4.86 today. Followed by nephrology.  Monitoring with watchful expectancy and possible dialysis soon.  No dialysis planned this admission.  Mild cognitive dysfunction with metabolic encephalopathy. At baseline.  Continue delirium precautions.  Resumed Aricept.   Rhabdomyolysis, nontraumatic Improved.   Hypertensive urgency.  BP high of 233/89 condition.  Has improved with initiation of medication.  We will continue to monitor.  Hyperglycemia due to type 2 diabetes mellitus  Stable today.  On sliding scale.   History of shingles 12/05/2020 Completed Valtrex.   PAD (peripheral artery disease Stable.   Coronary artery disease involving native coronary artery of native heart Patient on digoxin, Ranexa, statin and Plavix. Digoxin level therapeutic, continue with renal dosing. With worsening renal functions, Ranexa is contraindicated.  Discontinue.  History of breast cancer Continue Arimidex from home.  Generalized weakness at home.  Eval by PT OT.  Recommended SNF.  Stable for transfer.  DVT prophylaxis: heparin injection 5,000 Units Start: 12/22/20 1600 SCDs Start: 12/18/20 2238   Code Status: Full code  Family Communication:  Patient's husband and son at the  bedside.  Status is: Inpatient  Remains inpatient appropriate because: Worsening renal functions.  Unsafe discharge plan.  Medically stable to transfer to skilled level of care.  Consultants: Infectious disease Nephrology  Procedures: Lumbar puncture Bilateral knee aspirate and steroid injections  Anti-infectives:  Vancomycin,ampicillin and acyclovir 12/11-12/13 Rocephin 12/11--- 12/14  Anti-infectives (From admission, onward)    Start     Dose/Rate Route Frequency Ordered Stop   12/21/20 1200  cefTRIAXone (ROCEPHIN) 2 g in sodium chloride 0.9 % 100 mL IVPB  Status:  Discontinued        2 g 200 mL/hr over 30 Minutes Intravenous Every 24 hours 12/20/20 1805 12/21/20 1901   12/21/20 0200  vancomycin (VANCOREADY) IVPB 750 mg/150 mL  Status:  Discontinued        750 mg 150 mL/hr over 60 Minutes Intravenous Every 48 hours 12/18/20 2312 12/20/20 1416   12/20/20 2200  vancomycin (VANCOREADY) IVPB 750 mg/150 mL  Status:  Discontinued        750 mg 150 mL/hr over 60 Minutes Intravenous Every 48 hours 12/20/20 1416 12/20/20 1805   12/19/20 2200  ceFEPIme (MAXIPIME) 2 g in sodium chloride 0.9 % 100 mL IVPB  Status:  Discontinued        2 g 200 mL/hr over 30 Minutes Intravenous Every 24 hours 12/18/20 2309 12/19/20 1114   12/19/20 2200  cefTRIAXone (ROCEPHIN) 2 g in sodium chloride 0.9 % 100 mL IVPB  Status:  Discontinued        2 g 200 mL/hr over 30 Minutes Intravenous Every 12 hours 12/19/20 1114 12/20/20 1805   12/19/20 1130  ampicillin (OMNIPEN) 2 g in sodium chloride 0.9 % 100 mL IVPB  Status:  Discontinued        2 g 300 mL/hr over 20 Minutes Intravenous Every 12 hours 12/19/20 1116 12/20/20 1805   12/19/20 0200  vancomycin (VANCOCIN) IVPB 1000 mg/200 mL premix       See Hyperspace for full Linked Orders Report.   1,000 mg 200 mL/hr over 60 Minutes Intravenous  Once 12/19/20 0042 12/19/20 0204   12/19/20 0200  vancomycin (VANCOREADY) IVPB 1500 mg/300 mL       See Hyperspace for  full Linked Orders Report.   1,500 mg 150 mL/hr over 120 Minutes Intravenous  Once 12/19/20 0042 12/19/20 0453   12/19/20 0130  acyclovir (ZOVIRAX) 825 mg in dextrose 5 % 150 mL IVPB  Status:  Discontinued        10 mg/kg  82.3 kg (Adjusted) 166.5 mL/hr over 60 Minutes Intravenous Every 24 hours 12/19/20 0044 12/20/20 1825   12/18/20 2330  acyclovir (ZOVIRAX) 825 mg in dextrose 5 % 150 mL IVPB  Status:  Discontinued        10 mg/kg  82.3 kg (Adjusted) 166.5 mL/hr over 60 Minutes Intravenous Every 24 hours 12/18/20 2318 12/19/20 0044   12/18/20 2300  metroNIDAZOLE (FLAGYL) IVPB 500 mg  Status:  Discontinued        500 mg 100 mL/hr over 60 Minutes Intravenous Every 12 hours 12/18/20 2251 12/19/20 1115   12/18/20 2230  vancomycin (VANCOREADY) IVPB 1500 mg/300 mL  Status:  Discontinued       See Hyperspace for full Linked Orders Report.   1,500 mg 150 mL/hr over 120 Minutes Intravenous  Once 12/18/20 2123 12/19/20 0042  12/18/20 2130  ceFEPIme (MAXIPIME) 2 g in sodium chloride 0.9 % 100 mL IVPB        2 g 200 mL/hr over 30 Minutes Intravenous  Once 12/18/20 2117 12/19/20 0000   12/18/20 2130  metroNIDAZOLE (FLAGYL) IVPB 500 mg  Status:  Discontinued        500 mg 100 mL/hr over 60 Minutes Intravenous  Once 12/18/20 2117 12/18/20 2342   12/18/20 2130  vancomycin (VANCOCIN) IVPB 1000 mg/200 mL premix  Status:  Discontinued        1,000 mg 200 mL/hr over 60 Minutes Intravenous  Once 12/18/20 2117 12/18/20 2123   12/18/20 2130  vancomycin (VANCOCIN) IVPB 1000 mg/200 mL premix  Status:  Discontinued       See Hyperspace for full Linked Orders Report.   1,000 mg 200 mL/hr over 60 Minutes Intravenous  Once 12/18/20 2123 12/19/20 0042       Subjective:  Patient seen and examined.  She is fairly comfortable today.  Family at the bedside.  Still has some knee pain but was able to move around, transfer to the side with less painful knees.  Afebrile overnight.  Constipation relieved and she was  very pleased with it.  Wants to go home.  Family agreeable that she needed short-term rehab.  Objective: Vitals:   12/23/20 0739 12/23/20 1154  BP: (!) 157/55   Pulse: 75 72  Resp: 20   Temp: 98.6 F (37 C)   SpO2: 92%     Intake/Output Summary (Last 24 hours) at 12/23/2020 1350 Last data filed at 12/22/2020 1853 Gross per 24 hour  Intake 240 ml  Output 650 ml  Net -410 ml   Filed Weights   12/18/20 0751 12/18/20 0755 12/19/20 2056  Weight: 82 kg 113.4 kg 80.5 kg   Body mass index is 27.8 kg/m.   Physical Exam:  General: Frail and debilitated.  Chronically sick looking but pleasant and comfortable at rest. Cardiovascular: S1-S2 normal.  Regular rate rhythm. Respiratory: Bilateral clear.  No added sounds Gastrointestinal: Soft and nontender.  Bowel sound present. Ext: Mildly tender both knees with some suprapatellar tenderness.  No erythema or edema. Neuro: Awake.  Alert and oriented.  Generalized weakness.  No focal deficits.   Data Review: I have personally reviewed the following laboratory data and studies,  CBC: Recent Labs  Lab 12/18/20 0834 12/19/20 0616 12/20/20 0231 12/22/20 0202  WBC 15.2* 19.2* 14.4* 12.9*  NEUTROABS  --   --   --  10.7*  HGB 11.0* 10.3* 9.2* 9.1*  HCT 35.0* 33.3* 28.2* 28.0*  MCV 86.4 84.1 82.5 81.9  PLT 331 337 292 409   Basic Metabolic Panel: Recent Labs  Lab 12/18/20 0834 12/19/20 0616 12/20/20 0231 12/21/20 0241 12/22/20 0202 12/23/20 0509  NA 138 138 139  --  137 133*  K 4.9 4.3 3.8  --  3.4* 3.3*  CL 110 106 103  --  91* 90*  CO2 18* 21* 27  --  33* 29  GLUCOSE 219* 207* 108*  --  151* 218*  BUN 46* 46* 49*  --  56* 84*  CREATININE 4.11* 3.64* 3.59* 4.28* 4.10* 4.86*  CALCIUM 9.0 8.8* 8.2*  --  7.8* 7.8*  MG  --   --  1.8  --  2.1  --   PHOS  --   --  3.7  --  5.6*  --    Liver Function Tests: Recent Labs  Lab 12/18/20 0834 12/19/20 [id number]  AST 28 36  ALT 15 17  ALKPHOS 91 93  BILITOT 1.0 1.0  PROT 7.0  7.3  ALBUMIN 3.3* 2.9*   No results for input(s): LIPASE, AMYLASE in the last 168 hours. No results for input(s): AMMONIA in the last 168 hours. Cardiac Enzymes: Recent Labs  Lab 12/18/20 0834 12/20/20 0231  CKTOTAL 804* 286*   BNP (last 3 results) No results for input(s): BNP in the last 8760 hours.  ProBNP (last 3 results) No results for input(s): PROBNP in the last 8760 hours.  CBG: Recent Labs  Lab 12/22/20 1929 12/22/20 2337 12/23/20 0448 12/23/20 0737 12/23/20 1142  GLUCAP 217* 205* 211* 179* 194*   Recent Results (from the past 240 hour(s))  Resp Panel by RT-PCR (Flu A&B, Covid) Nasopharyngeal Swab     Status: None   Collection Time: 12/18/20  5:19 PM   Specimen: Nasopharyngeal Swab; Nasopharyngeal(NP) swabs in vial transport medium  Result Value Ref Range Status   SARS Coronavirus 2 by RT PCR NEGATIVE NEGATIVE Final    Comment: (NOTE) SARS-CoV-2 target nucleic acids are NOT DETECTED.  The SARS-CoV-2 RNA is generally detectable in upper respiratory specimens during the acute phase of infection. The lowest concentration of SARS-CoV-2 viral copies this assay can detect is 138 copies/mL. A negative result does not preclude SARS-Cov-2 infection and should not be used as the sole basis for treatment or other patient management decisions. A negative result may occur with  improper specimen collection/handling, submission of specimen other than nasopharyngeal swab, presence of viral mutation(s) within the areas targeted by this assay, and inadequate number of viral copies(<138 copies/mL). A negative result must be combined with clinical observations, patient history, and epidemiological information. The expected result is Negative.  Fact Sheet for Patients:  EntrepreneurPulse.com.au  Fact Sheet for Healthcare Providers:  IncredibleEmployment.be  This test is no t yet approved or cleared by the [place] FDA and  has been  authorized for detection and/or diagnosis of SARS-CoV-2 by FDA under an Emergency Use Authorization (EUA). This EUA will remain  in effect (meaning this test can be used) for the duration of the COVID-19 declaration under Section 564(b)(1) of the Act, 21 U.S.C.section 360bbb-3(b)(1), unless the authorization is terminated  or revoked sooner.       Influenza A by PCR NEGATIVE NEGATIVE Final   Influenza B by PCR NEGATIVE NEGATIVE Final    Comment: (NOTE) The Xpert Xpress SARS-CoV-2/FLU/RSV plus assay is intended as an aid in the diagnosis of influenza from Nasopharyngeal swab specimens and should not be used as a sole basis for treatment. Nasal washings and aspirates are unacceptable for Xpert Xpress SARS-CoV-2/FLU/RSV testing.  Fact Sheet for Patients: EntrepreneurPulse.com.au  Fact Sheet for Healthcare Providers: IncredibleEmployment.be  This test is not yet approved or cleared by the [place] FDA and has been authorized for detection and/or diagnosis of SARS-CoV-2 by FDA under an Emergency Use Authorization (EUA). This EUA will remain in effect (meaning this test can be used) for the duration of the COVID-19 declaration under Section 564(b)(1) of the Act, 21 U.S.C. section 360bbb-3(b)(1), unless the authorization is terminated or revoked.  Performed at [facility], [address], [place], [place] [address]   Urine Culture     Status: Abnormal   Collection Time: 12/18/20  5:30 PM   Specimen: Urine, Clean Catch  Result Value Ref Range Status   Specimen Description   Final    URINE, CLEAN CATCH Performed at [facility], [place],  [place], [place] [address]    Special Requests   Final    NONE Performed at [facility], [address] [place], [place] [address]    Culture (A)  Final    <10,000 COLONIES/mL INSIGNIFICANT GROWTH Performed at [facility] [address] [place],  [place] [address]    Report Status 12/19/2020 FINAL  Final  Blood culture (routine x 2)     Status: None (Preliminary result)   Collection Time: 12/18/20 10:30 PM   Specimen: BLOOD  Result Value Ref Range Status   Specimen Description BLOOD BLOOD RIGHT HAND  Final   Special Requests   Final    BOTTLES DRAWN AEROBIC AND ANAEROBIC Blood Culture adequate volume   Culture   Final    NO GROWTH 4 DAYS Performed at [facility], [address], [place], [place] [address]    Report Status PENDING  Incomplete  Blood culture (routine x 2)     Status: None (Preliminary result)   Collection Time: 12/18/20 10:35 PM   Specimen: BLOOD  Result Value Ref Range Status   Specimen Description BLOOD BLOOD LEFT HAND  Final   Special Requests   Final    BOTTLES DRAWN AEROBIC AND ANAEROBIC Blood Culture adequate volume   Culture   Final    NO GROWTH 4 DAYS Performed at [facility], [address] [place], [place] [address]    Report Status PENDING  Incomplete  VZV PCR, CSF     Status: None   Collection Time: 12/19/20 10:52 AM   Specimen: Cerebrospinal Fluid  Result Value Ref Range Status   VZV PCR, CSF Negative Negative Final    Comment: (NOTE) No Varicella Zoster Virus DNA detected. Performed At: [facility], [place] [id number] Rush Farmer MD DX:[id number]   CSF culture w Gram Stain     Status: None (Preliminary result)   Collection Time: 12/20/20  9:45 AM   Specimen: PATH Cytology CSF; Cerebrospinal Fluid  Result Value Ref Range Status   Specimen Description   Final    CSF Performed at [facility], [address], [place], [place] [address]    Special Requests   Final    NONE Performed at [facility], [address] [place], [place] [address]    Gram Stain   Final    WBC SEEN NO RBC SEEN NO ORGANISMS SEEN Performed at [facility], [address], [place], [place] [address]    Culture   Final    NO  GROWTH 3 DAYS Performed at [facility], [place] [address] [place], [place] [address]    Report Status PENDING  Incomplete     Studies: DG Knee 1-2 Views Left  Result Date: 12/21/2020 CLINICAL DATA:  Bilateral knee pain. EXAM: LEFT KNEE - 1-2 VIEW COMPARISON:  None. FINDINGS: No fracture or dislocation is noted. Small suprapatellar joint effusion is noted. Moderate narrowing of the medial and lateral joint spaces are noted. IMPRESSION: Moderate degenerative joint disease. Small suprapatellar joint effusion. No fracture or dislocation is noted. Electronically Signed   By: Marijo Conception M.D.   On: 12/21/2020 15:41   DG Knee 1-2 Views Right  Result Date: 12/21/2020 CLINICAL DATA:  Bilateral knee pain. EXAM: RIGHT KNEE - 1-2 VIEW COMPARISON:  None. FINDINGS: No fracture or dislocation is noted. Moderate size suprapatellar joint effusion is noted. Severe narrowing of lateral joint space is noted. Mild degenerative changes noted medially. IMPRESSION:  Moderate size suprapatellar joint effusion. Severe degenerative joint disease is noted laterally. No fracture or dislocation is noted. Electronically Signed   By: Marijo Conception M.D.   On: 12/21/2020 15:42   US RENAL  Result Date: 12/22/2020 CLINICAL DATA:  Acute kidney injury, history diabetes mellitus, COPD, discoid lupus, hypertension, coronary artery disease, former smoker, breast cancer EXAM: RENAL / URINARY TRACT ULTRASOUND COMPLETE COMPARISON:  None FINDINGS: Right Kidney: Renal measurements: 10.1 x 5.0 x 5.4 cm = volume: 140 mL. Normal cortical thickness. Slightly increased cortical echogenicity. Tiny cyst at upper pole 13 x 12 x 12 mm. Small cyst mid kidney 18 x 17 x 16 mm. Tiny cyst mid kidney 11 mm diameter. No solid mass or hydronephrosis. No shadowing calcifications. Left Kidney: Renal measurements: 10.1 x 5.6 x 5.3 cm = volume: 153 mL. Normal cortical thickness. Increased cortical echogenicity. No mass, hydronephrosis, or shadowing  calcification. Bladder: Appears normal for degree of bladder distention. LEFT ureteral jet was visualized. Other: N/A IMPRESSION: Medical renal disease changes of both kidneys. Small RIGHT renal cysts. Electronically Signed   By: Lavonia Dana M.D.   On: 12/22/2020 17:20   [place] UE VEIN MAPPING LEFT (PRE-OP AVF)  Result Date: 12/22/2020 CLINICAL DATA:  Preop evaluation prior to AV fistula creation EXAM: [place] EXTREM UP VEIN MAPPING COMPARISON:  None. FINDINGS: LEFT ARTERIES Wrist Radial Artery: Size 50mm Waveform monophasic Wrist Ulnar Artery: Size 57mm Waveform monophasic Prox. Forearm Radial Artery: Size 87mm Waveform triphasic Upper Arm Brachial Artery: Size 59mm Waveform triphasic LEFT VEINS Forearm Cephalic Vein: Prox 65mm Distal 51mm Depth 3-9JQ Upper Arm Cephalic Vein: Prox thrombosed distal thrombosed depth 73AL Upper Arm Basilic Vein: Prox 16mm Distal 8mm Depth 14-32mm Upper Arm Brachial Vein: Prox 4mm Distal 20mm Depth 14-72mm ADDITIONAL LEFT VEINS Axillary Vein:  77mm Subclavian Vein: Patient: Yes Respiratory Phasicity: Present Internal Jugular Vein: Patent: Yes    Respiratory Phasicity: Present IMPRESSION: 1. Monophasic flow seen in the radial and ulnar arteries at the level of the wrist. 2. Upper arm cephalic vein is thrombosed. Electronically Signed   By: Miachel Roux M.D.   On: 12/22/2020 16:26    Total time spent: 30 minutes  Barb Merino, MD  [facility] 12/23/2020  If 7PM-7AM, please contact night-coverage

## 2020-12-23 NOTE — Progress Notes (Signed)
Patient complained of constipation and rectal pain last night during beginning of shift. Colace PRN given. Was straining and manual stimulation done but not very effective. NP Randol Kern notified. Ordered colace one time order, SMOG, one time dose of morphine, and senokot was added to am meds. SMOG was effective, large bowel movement after enema. Rested for the remainder of the night.

## 2020-12-24 LAB — GLUCOSE, CAPILLARY
Glucose-Capillary: 158 mg/dL — ABNORMAL HIGH (ref 70–99)
Glucose-Capillary: 164 mg/dL — ABNORMAL HIGH (ref 70–99)
Glucose-Capillary: 209 mg/dL — ABNORMAL HIGH (ref 70–99)
Glucose-Capillary: 217 mg/dL — ABNORMAL HIGH (ref 70–99)
Glucose-Capillary: 228 mg/dL — ABNORMAL HIGH (ref 70–99)
Glucose-Capillary: 314 mg/dL — ABNORMAL HIGH (ref 70–99)

## 2020-12-24 LAB — BASIC METABOLIC PANEL
Anion gap: 12 (ref 5–15)
BUN: 91 mg/dL — ABNORMAL HIGH (ref 8–23)
CO2: 30 mmol/L (ref 22–32)
Calcium: 7.6 mg/dL — ABNORMAL LOW (ref 8.9–10.3)
Chloride: 94 mmol/L — ABNORMAL LOW (ref 98–111)
Creatinine, Ser: 5.63 mg/dL — ABNORMAL HIGH (ref 0.44–1.00)
GFR, Estimated: 7 mL/min — ABNORMAL LOW (ref >60)
Glucose, Bld: 165 mg/dL — ABNORMAL HIGH (ref 70–99)
Potassium: 3.5 mmol/L (ref 3.5–5.1)
Sodium: 136 mmol/L (ref 135–145)

## 2020-12-24 MED ORDER — INSULIN ASPART 100 UNIT/ML IJ SOLN
0.0000 [IU] | Freq: Every day | INTRAMUSCULAR | Status: DC
  Administered 2020-12-24 – 2020-12-26 (×3): 4 [IU] via SUBCUTANEOUS
  Administered 2020-12-27 – 2020-12-31 (×5): 3 [IU] via SUBCUTANEOUS
  Filled 2020-12-24 (×8): qty 1

## 2020-12-24 MED ORDER — INSULIN ASPART 100 UNIT/ML IJ SOLN
0.0000 [IU] | Freq: Three times a day (TID) | INTRAMUSCULAR | Status: DC
  Administered 2020-12-24: 17:00:00 3 [IU] via SUBCUTANEOUS
  Administered 2020-12-25: 17:00:00 9 [IU] via SUBCUTANEOUS
  Administered 2020-12-25: 08:00:00 3 [IU] via SUBCUTANEOUS
  Administered 2020-12-25 – 2020-12-26 (×2): 5 [IU] via SUBCUTANEOUS
  Administered 2020-12-26: 17:00:00 9 [IU] via SUBCUTANEOUS
  Administered 2020-12-26: 09:00:00 2 [IU] via SUBCUTANEOUS
  Administered 2020-12-27 – 2020-12-29 (×6): 3 [IU] via SUBCUTANEOUS
  Administered 2020-12-29 – 2021-01-01 (×5): 2 [IU] via SUBCUTANEOUS
  Administered 2021-01-01: 10:00:00 3 [IU] via SUBCUTANEOUS
  Filled 2020-12-24 (×19): qty 1

## 2020-12-24 NOTE — Progress Notes (Signed)
[facility]  ROUNDING NOTE   Subjective:   Jennifer Carrillo is a 87 y.o. female with past medical concerns including hypertension, diabetes, CAD, shingles, breast CA, and chronic kidney disease stage 4. She presented to the ed with pain extending from her back to her legs and was not able to get out of bed. Patient was admitted for Bilateral leg pain [M79.604, M79.605] Sepsis ([place]) [A41.9] Acute bilateral low back pain, unspecified whether sciatica present [M54.50] Sepsis without acute organ dysfunction, due to unspecified organism [facility]) [A41.9]  Patient is currently known to our office and is followed by Dr Candiss Norse. She was last seen in the office on 12/12/20.   Patient seen resting quietly in bed Alert and oriented Tolerating meals, denies nausea and vomiting Denies shortness of breath  Creatinine continues to rise 5.6 today Recorded urine output of 550 in preceding 24 hours.   Objective:  Vital signs in last 24 hours:  Temp:  [98.4 F (36.9 C)-98.6 F (37 C)] 98.4 F (36.9 C) (12/17 0736) Pulse Rate:  [64-73] 69 (12/17 0736) Resp:  [16-22] 22 (12/17 0736) BP: (143-171)/(46-57) 143/55 (12/17 1026) SpO2:  [92 %-97 %] 95 % (12/17 0736)  Weight change:  Filed Weights   12/18/20 0751 12/18/20 0755 12/19/20 2056  Weight: 82 kg 113.4 kg 80.5 kg    Intake/Output: I/O last 3 completed shifts: In: -  Out: 550 [Urine:550]   Intake/Output this shift:  Total I/O In: 240 [P.O.:240] Out: -   Physical Exam: General: NAD, resting quietly  Head: Normocephalic, atraumatic. Moist oral mucosal membranes  Eyes: Anicteric  Lungs:  Clear to auscultation, normal effort  Heart: Regular rate and rhythm  Abdomen:  Soft, nontender, nondistended  Extremities:  no peripheral edema.  Neurologic: Nonfocal, moving all four extremities  Skin: No lesions       Basic Metabolic Panel: Recent Labs  Lab 12/19/20 0616 12/20/20 0231 12/21/20 0241 12/22/20 0202 12/23/20 0509  12/24/20 0605  NA 138 139  --  137 133* 136  K 4.3 3.8  --  3.4* 3.3* 3.5  CL 106 103  --  91* 90* 94*  CO2 21* 27  --  33* 29 30  GLUCOSE 207* 108*  --  151* 218* 165*  BUN 46* 49*  --  56* 84* 91*  CREATININE 3.64* 3.59* 4.28* 4.10* 4.86* 5.63*  CALCIUM 8.8* 8.2*  --  7.8* 7.8* 7.6*  MG  --  1.8  --  2.1  --   --   PHOS  --  3.7  --  5.6*  --   --      Liver Function Tests: Recent Labs  Lab 12/18/20 0834 12/19/20 0616  AST 28 36  ALT 15 17  ALKPHOS 91 93  BILITOT 1.0 1.0  PROT 7.0 7.3  ALBUMIN 3.3* 2.9*    No results for input(s): LIPASE, AMYLASE in the last 168 hours. No results for input(s): AMMONIA in the last 168 hours.  CBC: Recent Labs  Lab 12/18/20 0834 12/19/20 0616 12/20/20 0231 12/22/20 0202  WBC 15.2* 19.2* 14.4* 12.9*  NEUTROABS  --   --   --  10.7*  HGB 11.0* 10.3* 9.2* 9.1*  HCT 35.0* 33.3* 28.2* 28.0*  MCV 86.4 84.1 82.5 81.9  PLT 331 337 292 347     Cardiac Enzymes: Recent Labs  Lab 12/18/20 0834 12/20/20 0231  CKTOTAL 804* 286*     BNP: Invalid input(s): POCBNP  CBG: Recent Labs  Lab 12/23/20 1558  12/23/20 1934 12/24/20 0003 12/24/20 0436 12/24/20 0734  GLUCAP 262* 249* 209* 164* 158*     Microbiology: Results for orders placed or performed during the hospital encounter of 12/18/20  Resp Panel by RT-PCR (Flu A&B, Covid) Nasopharyngeal Swab     Status: None   Collection Time: 12/18/20  5:19 PM   Specimen: Nasopharyngeal Swab; Nasopharyngeal(NP) swabs in vial transport medium  Result Value Ref Range Status   SARS Coronavirus 2 by RT PCR NEGATIVE NEGATIVE Final    Comment: (NOTE) SARS-CoV-2 target nucleic acids are NOT DETECTED.  The SARS-CoV-2 RNA is generally detectable in upper respiratory specimens during the acute phase of infection. The lowest concentration of SARS-CoV-2 viral copies this assay can detect is 138 copies/mL. A negative result does not preclude SARS-Cov-2 infection and should not be used as the  sole basis for treatment or other patient management decisions. A negative result may occur with  improper specimen collection/handling, submission of specimen other than nasopharyngeal swab, presence of viral mutation(s) within the areas targeted by this assay, and inadequate number of viral copies(<138 copies/mL). A negative result must be combined with clinical observations, patient history, and epidemiological information. The expected result is Negative.  Fact Sheet for Patients:  EntrepreneurPulse.com.au  Fact Sheet for Healthcare Providers:  IncredibleEmployment.be  This test is no t yet approved or cleared by the [place] FDA and  has been authorized for detection and/or diagnosis of SARS-CoV-2 by FDA under an Emergency Use Authorization (EUA). This EUA will remain  in effect (meaning this test can be used) for the duration of the COVID-19 declaration under Section 564(b)(1) of the Act, 21 U.S.C.section 360bbb-3(b)(1), unless the authorization is terminated  or revoked sooner.       Influenza A by PCR NEGATIVE NEGATIVE Final   Influenza B by PCR NEGATIVE NEGATIVE Final    Comment: (NOTE) The Xpert Xpress SARS-CoV-2/FLU/RSV plus assay is intended as an aid in the diagnosis of influenza from Nasopharyngeal swab specimens and should not be used as a sole basis for treatment. Nasal washings and aspirates are unacceptable for Xpert Xpress SARS-CoV-2/FLU/RSV testing.  Fact Sheet for Patients: EntrepreneurPulse.com.au  Fact Sheet for Healthcare Providers: IncredibleEmployment.be  This test is not yet approved or cleared by the [place] FDA and has been authorized for detection and/or diagnosis of SARS-CoV-2 by FDA under an Emergency Use Authorization (EUA). This EUA will remain in effect (meaning this test can be used) for the duration of the COVID-19 declaration under Section 564(b)(1) of the  Act, 21 U.S.C. section 360bbb-3(b)(1), unless the authorization is terminated or revoked.  Performed at [facility], [address], [place], [place] [address]   Urine Culture     Status: Abnormal   Collection Time: 12/18/20  5:30 PM   Specimen: Urine, Clean Catch  Result Value Ref Range Status   Specimen Description   Final    URINE, CLEAN CATCH Performed at [facility], [address] [place], [place] [address]    Special Requests   Final    NONE Performed at [facility], [address] [place], [place] [address]    Culture (A)  Final    <10,000 COLONIES/mL INSIGNIFICANT GROWTH Performed at [facility] [address], [place],  [address]    Report Status 12/19/2020 FINAL  Final  Blood culture (routine x 2)     Status: None (Preliminary result)   Collection Time: 12/18/20 10:30 PM   Specimen: BLOOD  Result Value  Ref Range Status   Specimen Description BLOOD BLOOD RIGHT HAND  Final   Special Requests   Final    BOTTLES DRAWN AEROBIC AND ANAEROBIC Blood Culture adequate volume   Culture   Final    NO GROWTH 4 DAYS Performed at [facility], [address] [place], [place] [address]    Report Status PENDING  Incomplete  Blood culture (routine x 2)     Status: None (Preliminary result)   Collection Time: 12/18/20 10:35 PM   Specimen: BLOOD  Result Value Ref Range Status   Specimen Description BLOOD BLOOD LEFT HAND  Final   Special Requests   Final    BOTTLES DRAWN AEROBIC AND ANAEROBIC Blood Culture adequate volume   Culture   Final    NO GROWTH 4 DAYS Performed at [facility], [address] [place], [place] [address]    Report Status PENDING  Incomplete  VZV PCR, CSF     Status: None   Collection Time: 12/19/20 10:52 AM   Specimen: Cerebrospinal Fluid  Result Value Ref Range Status   VZV PCR, CSF Negative Negative Final    Comment: (NOTE) No Varicella Zoster Virus DNA detected. Performed At:  [facility] [place], [place] [id number] Rush Farmer MD WG:[id number]   CSF culture w Gram Stain     Status: None   Collection Time: 12/20/20  9:45 AM   Specimen: PATH Cytology CSF; Cerebrospinal Fluid  Result Value Ref Range Status   Specimen Description   Final    CSF Performed at [facility], [address], [place], [place] [address]    Special Requests   Final    NONE Performed at [facility], [address] [place], [place] [address]    Gram Stain   Final    WBC SEEN NO RBC SEEN NO ORGANISMS SEEN Performed at [facility], [address] [place], [place] [address]    Culture   Final    NO GROWTH 3 DAYS Performed at [facility], [place] [address] [place],  [address]    Report Status 12/23/2020 FINAL  Final    Coagulation Studies: No results for input(s): LABPROT, INR in the last 72 hours.  Urinalysis: No results for input(s): COLORURINE, LABSPEC, PHURINE, GLUCOSEU, HGBUR, BILIRUBINUR, KETONESUR, PROTEINUR, UROBILINOGEN, NITRITE, LEUKOCYTESUR in the last 72 hours.  Invalid input(s): APPERANCEUR    Imaging: US RENAL  Result Date: 12/22/2020 CLINICAL DATA:  Acute kidney injury, history diabetes mellitus, COPD, discoid lupus, hypertension, coronary artery disease, former smoker, breast cancer EXAM: RENAL / URINARY TRACT ULTRASOUND COMPLETE COMPARISON:  None FINDINGS: Right Kidney: Renal measurements: 10.1 x 5.0 x 5.4 cm = volume: 140 mL. Normal cortical thickness. Slightly increased cortical echogenicity. Tiny cyst at upper pole 13 x 12 x 12 mm. Small cyst mid kidney 18 x 17 x 16 mm. Tiny cyst mid kidney 11 mm diameter. No solid mass or hydronephrosis. No shadowing calcifications. Left Kidney: Renal measurements: 10.1 x 5.6 x 5.3 cm = volume: 153 mL. Normal cortical thickness. Increased cortical echogenicity. No mass, hydronephrosis, or shadowing calcification. Bladder: Appears normal for degree of  bladder distention. LEFT ureteral jet was visualized. Other: N/A IMPRESSION: Medical renal disease changes of both kidneys. Small RIGHT renal cysts. Electronically Signed   By: Lavonia Dana M.D.   On: 12/22/2020 17:20   [place] UE VEIN MAPPING LEFT (PRE-OP AVF)  Result Date: 12/22/2020 CLINICAL DATA:  Preop evaluation prior to AV fistula  creation EXAM: [place] EXTREM UP VEIN MAPPING COMPARISON:  None. FINDINGS: LEFT ARTERIES Wrist Radial Artery: Size 2mm Waveform monophasic Wrist Ulnar Artery: Size 46mm Waveform monophasic Prox. Forearm Radial Artery: Size 81mm Waveform triphasic Upper Arm Brachial Artery: Size 18mm Waveform triphasic LEFT VEINS Forearm Cephalic Vein: Prox 62mm Distal 21mm Depth 1-8HU Upper Arm Cephalic Vein: Prox thrombosed distal thrombosed depth 31SH Upper Arm Basilic Vein: Prox 42mm Distal 56mm Depth 14-38mm Upper Arm Brachial Vein: Prox 29mm Distal 90mm Depth 14-61mm ADDITIONAL LEFT VEINS Axillary Vein:  10mm Subclavian Vein: Patient: Yes Respiratory Phasicity: Present Internal Jugular Vein: Patent: Yes    Respiratory Phasicity: Present IMPRESSION: 1. Monophasic flow seen in the radial and ulnar arteries at the level of the wrist. 2. Upper arm cephalic vein is thrombosed. Electronically Signed   By: Miachel Roux M.D.   On: 12/22/2020 16:26     Medications:     amLODipine  10 mg Oral Daily   anastrozole  1 mg Oral QPC supper   atorvastatin  40 mg Oral Daily   clopidogrel  75 mg Oral Daily   digoxin  0.0625 mg Oral Q48H   docusate sodium  100 mg Oral BID   donepezil  10 mg Oral QHS   heparin injection (subcutaneous)  5,000 Units Subcutaneous Q8H   hydrALAZINE  100 mg Oral Q8H   insulin aspart  0-15 Units Subcutaneous Q4H   isosorbide mononitrate  60 mg Oral Daily   mometasone-formoterol  2 puff Inhalation BID   predniSONE  20 mg Oral Q breakfast   senna  1 tablet Oral Daily   acetaminophen **OR** acetaminophen, docusate sodium, hydrALAZINE, labetalol, ondansetron **OR** ondansetron (ZOFRAN)  IV, traMADol  Assessment/ Plan:  Ms. Jennifer Carrillo is a 87 y.o.  female with past medical concerns including hypertension, diabetes, CAD, shingles, breast CA, and chronic kidney disease stage 4. She presented to the ed with pain extending from her back to her legs and was not able to get out of bed. Patient was admitted for Bilateral leg pain [M79.604, M79.605] Sepsis ([place]) [A41.9] Acute bilateral low back pain, unspecified whether sciatica present [M54.50] Sepsis without acute organ dysfunction, due to unspecified organism [facility]) [A41.9]   Acute Kidney Injury on chronic kidney disease stage V with baseline creatinine 3.83 and GFR of 11 on 11/26/20.  Acute kidney injury secondary to dehydration and rhabdo No IV contrast exposure.  Telmisartan held.   Renal ultrasound negative for obstruction, does show right renal cyst.  -Creatinine continues an upward trend at 5.6 today.  Monitoring urine output.  Recorded 550 mL in preceding 24 hours  -Discussed with the patient the need to consider dialysis sooner than later.  Patient currently interested in peritoneal dialysis.  Will discuss with surgery and consider possible PD placement early next week.  Lab Results  Component Value Date   CREATININE 5.63 (H) 12/24/2020   CREATININE 4.86 (H) 12/23/2020   CREATININE 4.10 (H) 12/22/2020    Intake/Output Summary (Last 24 hours) at 12/24/2020 1120 Last data filed at 12/24/2020 1022 Gross per 24 hour  Intake 240 ml  Output 550 ml  Net -310 ml    2. Anemia of chronic kidney disease Lab Results  Component Value Date   HGB 9.1 (L) 12/22/2020    Hemoglobin within acceptable alignment  3. Secondary Hyperparathyroidism: with outpatient labs:phosphorus 3.5, calcium 9.3 on 11/07/20.   Lab Results  Component Value Date   PTH 35 06/27/2018   CALCIUM 7.6 (L) 12/24/2020   PHOS 5.6 (  H) 12/22/2020    We will continue to monitor bone mineral metabolism during this admission   4. Diabetes mellitus  type II with chronic kidney disease insulin dependent. Home regimen includes NPH. Most recent hemoglobin A1c is 5.3 on 12/18/20.  Primary team to manage sliding scale insulin   5.  Hypertension with chronic kidney disease.  Home regimen includes hydrochlorothiazide, isosorbide, telmisartan, amlodipine, and hydralazine.  Telmisartan and HCTZ currently held.  - BP 143/55   LOS: 6   12/17/202211:20 AM

## 2020-12-24 NOTE — TOC Progression Note (Signed)
Transition of Care [facility]) - Progression Note    Patient Details  Name: Jennifer Carrillo MRN: [id number] Date of Birth: [birth year]  Transition of Care [facility]) CM/SW [place], LCSW Phone Number: 12/24/2020, 12:51 PM  Clinical Narrative:  [place] will not have a bed on Monday. Extended search.   Expected Discharge Plan: [place] Barriers to Discharge: Continued Medical Work up  Expected Discharge Plan and Services Expected Discharge Plan: [place]   Discharge Planning Services: CM Consult Post Acute Care Choice: [place] arrangements for the past 2 months: Single Family Home                 DME Arranged: N/A DME Agency: NA                   Social Determinants of Health (SDOH) Interventions    Readmission Risk Interventions Readmission Risk Prevention Plan 12/19/2020  Transportation Screening Complete  Medication Review Press photographer) Complete  PCP or Specialist appointment within 3-5 days of discharge Complete  HRI or Home Care Consult Complete  SW Recovery Care/Counseling Consult Complete  Palliative Care Screening Not [place] Complete  Some recent data might be hidden

## 2020-12-24 NOTE — Progress Notes (Signed)
PROGRESS NOTE  Jennifer Carrillo JJK:[id number] DOB: [birth year] DOA: 12/18/2020 PCP: [name], MD   LOS: 6 days   Brief narrative: 87 y.o. female with past medical history of diabetes mellitus type 2, hypertension, CKD stage IV, coronary artery disease, history of shingles diagnosed on 11/28 treated with Valtrex and gabapentin presented to hospital with 1 day history of diffuse pain extending from her lower back to her legs to the level where she was unable to get out of the bed.  In the emergency room, she was febrile.  Blood pressures were elevated.  Creatinine 4.1.  Baseline creatinine about 3.8-4.  Admitted with broad-spectrum antibiotics.  Remains in the hospital waiting for SNF bed as well as worsening renal functions.    Assessment/Plan:  Principal Problem:   Sepsis ([place]) Active Problems:   PAD (peripheral artery disease) ([place])   Coronary artery disease involving native coronary artery of native heart   Anemia of chronic kidney failure, stage 4 (severe) (H[facility])   Acute kidney injury superimposed on CKD IV ([facility])   Metabolic acidosis   Rhabdomyolysis   Hypertensive emergency without congestive heart failure   Hyperglycemia due to type 2 diabetes mellitus ([place])   Shingles 12/05/2020    Severe sepsis present on admission with altered mental status and acute kidney injury: Likely because polyarthritis.   Presented with fever, tachycardia and leukocytosis.  Recent history of treated shingles.  Procalcitonin normal.  Lactate normal.   Lumbar puncture without evidence of meningitis, antibiotics discontinued  MRI lumbosacral spine negative for infection.  Urinalysis was unremarkable.  Blood cultures negative so far.   Polyarthritis, seen by rheumatology.     Bilateral knee aspirate with pseudogout, negative cultures.  Steroid injections.   Started patient on low-dose prednisone and taper over 1 week.   Bilateral x-ray with extensive osteoarthritis.   Use low-dose tramadol for  pain relief.  Altered mental status likely metabolic infective encephalopathy. Adequately improved.  Mental status improved.   Acute kidney injury superimposed on CKD IV with metabolic acidosis Progressive azotemia.  Renal ultrasound with no evidence of obstruction. Creatinine continues to rise. Followed by nephrology.  Monitoring with watchful expectancy and possible dialysis soon.    Mild cognitive dysfunction with metabolic encephalopathy. At baseline.  Continue delirium precautions.  Resumed Aricept.   Rhabdomyolysis, nontraumatic Improved.   Hypertensive urgency.  BP high of 233/89 condition.  Has improved with initiation of medication.  We will continue to monitor.  Hyperglycemia due to type 2 diabetes mellitus  Stable today.  On sliding scale.   History of shingles 12/05/2020 Completed Valtrex.   PAD (peripheral artery disease Stable.   Coronary artery disease involving native coronary artery of native heart Patient on digoxin, Ranexa, statin and Plavix. Digoxin level therapeutic, continue with renal dosing. With worsening renal functions, Ranexa is contraindicated.  Discontinue.  History of breast cancer Continue Arimidex from home.  Generalized weakness at home.  Eval by PT OT.  Recommended SNF.  Stable for transfer if cleared by nephrology.  DVT prophylaxis: heparin injection 5,000 Units Start: 12/22/20 1600 SCDs Start: 12/18/20 2238   Code Status: Full code  Family Communication:  Patient's husband and son at the bedside 12/16.  Status is: Inpatient  Remains inpatient appropriate because: Worsening renal functions.  Unsafe discharge plan.  Medically stable to transfer to skilled level of care.  Consultants: Infectious disease Nephrology Rheumatology  Procedures: Lumbar puncture Bilateral knee aspirate and steroid injections  Anti-infectives:  Vancomycin,ampicillin and acyclovir 12/11-12/13 Rocephin 12/11--- 12/14  Anti-infectives (From  admission, onward)    Start     Dose/Rate Route Frequency Ordered Stop   12/21/20 1200  cefTRIAXone (ROCEPHIN) 2 g in sodium chloride 0.9 % 100 mL IVPB  Status:  Discontinued        2 g 200 mL/hr over 30 Minutes Intravenous Every 24 hours 12/20/20 1805 12/21/20 1901   12/21/20 0200  vancomycin (VANCOREADY) IVPB 750 mg/150 mL  Status:  Discontinued        750 mg 150 mL/hr over 60 Minutes Intravenous Every 48 hours 12/18/20 2312 12/20/20 1416   12/20/20 2200  vancomycin (VANCOREADY) IVPB 750 mg/150 mL  Status:  Discontinued        750 mg 150 mL/hr over 60 Minutes Intravenous Every 48 hours 12/20/20 1416 12/20/20 1805   12/19/20 2200  ceFEPIme (MAXIPIME) 2 g in sodium chloride 0.9 % 100 mL IVPB  Status:  Discontinued        2 g 200 mL/hr over 30 Minutes Intravenous Every 24 hours 12/18/20 2309 12/19/20 1114   12/19/20 2200  cefTRIAXone (ROCEPHIN) 2 g in sodium chloride 0.9 % 100 mL IVPB  Status:  Discontinued        2 g 200 mL/hr over 30 Minutes Intravenous Every 12 hours 12/19/20 1114 12/20/20 1805   12/19/20 1130  ampicillin (OMNIPEN) 2 g in sodium chloride 0.9 % 100 mL IVPB  Status:  Discontinued        2 g 300 mL/hr over 20 Minutes Intravenous Every 12 hours 12/19/20 1116 12/20/20 1805   12/19/20 0200  vancomycin (VANCOCIN) IVPB 1000 mg/200 mL premix       See Hyperspace for full Linked Orders Report.   1,000 mg 200 mL/hr over 60 Minutes Intravenous  Once 12/19/20 0042 12/19/20 0204   12/19/20 0200  vancomycin (VANCOREADY) IVPB 1500 mg/300 mL       See Hyperspace for full Linked Orders Report.   1,500 mg 150 mL/hr over 120 Minutes Intravenous  Once 12/19/20 0042 12/19/20 0453   12/19/20 0130  acyclovir (ZOVIRAX) 825 mg in dextrose 5 % 150 mL IVPB  Status:  Discontinued        10 mg/kg  82.3 kg (Adjusted) 166.5 mL/hr over 60 Minutes Intravenous Every 24 hours 12/19/20 0044 12/20/20 1825   12/18/20 2330  acyclovir (ZOVIRAX) 825 mg in dextrose 5 % 150 mL IVPB  Status:  Discontinued         10 mg/kg  82.3 kg (Adjusted) 166.5 mL/hr over 60 Minutes Intravenous Every 24 hours 12/18/20 2318 12/19/20 0044   12/18/20 2300  metroNIDAZOLE (FLAGYL) IVPB 500 mg  Status:  Discontinued        500 mg 100 mL/hr over 60 Minutes Intravenous Every 12 hours 12/18/20 2251 12/19/20 1115   12/18/20 2230  vancomycin (VANCOREADY) IVPB 1500 mg/300 mL  Status:  Discontinued       See Hyperspace for full Linked Orders Report.   1,500 mg 150 mL/hr over 120 Minutes Intravenous  Once 12/18/20 2123 12/19/20 0042   12/18/20 2130  ceFEPIme (MAXIPIME) 2 g in sodium chloride 0.9 % 100 mL IVPB        2 g 200 mL/hr over 30 Minutes Intravenous  Once 12/18/20 2117 12/19/20 0000   12/18/20 2130  metroNIDAZOLE (FLAGYL) IVPB 500 mg  Status:  Discontinued        500 mg 100 mL/hr over 60 Minutes Intravenous  Once 12/18/20 2117 12/18/20 2342   12/18/20 2130  vancomycin (VANCOCIN) IVPB 1000  mg/200 mL premix  Status:  Discontinued        1,000 mg 200 mL/hr over 60 Minutes Intravenous  Once 12/18/20 2117 12/18/20 2123   12/18/20 2130  vancomycin (VANCOCIN) IVPB 1000 mg/200 mL premix  Status:  Discontinued       See Hyperspace for full Linked Orders Report.   1,000 mg 200 mL/hr over 60 Minutes Intravenous  Once 12/18/20 2123 12/19/20 0042       Subjective:  Patient seen and examined.  No overnight events.  Mild to moderate pain on the bilateral knees.  Objective: Vitals:   12/24/20 0736 12/24/20 1026  BP: (!) 171/54 (!) 143/55  Pulse: 69   Resp: (!) 22   Temp: 98.4 F (36.9 C)   SpO2: 95%     Intake/Output Summary (Last 24 hours) at 12/24/2020 1115 Last data filed at 12/24/2020 1022 Gross per 24 hour  Intake 240 ml  Output 550 ml  Net -310 ml   Filed Weights   12/18/20 0751 12/18/20 0755 12/19/20 2056  Weight: 82 kg 113.4 kg 80.5 kg   Body mass index is 27.8 kg/m.   Physical Exam:  General: Frail and debilitated.  Chronically sick looking but pleasant and comfortable at  rest. Cardiovascular: S1-S2 normal.  Regular rate rhythm. Respiratory: Bilateral clear.  No added sounds Gastrointestinal: Soft and nontender.  Bowel sound present. Ext: Mildly tender both knees with some suprapatellar tenderness.  No erythema or edema. Neuro: Awake.  Alert and oriented.  Generalized weakness.  No focal deficits.   Data Review: I have personally reviewed the following laboratory data and studies,  CBC: Recent Labs  Lab 12/18/20 0834 12/19/20 0616 12/20/20 0231 12/22/20 0202  WBC 15.2* 19.2* 14.4* 12.9*  NEUTROABS  --   --   --  10.7*  HGB 11.0* 10.3* 9.2* 9.1*  HCT 35.0* 33.3* 28.2* 28.0*  MCV 86.4 84.1 82.5 81.9  PLT 331 337 292 161   Basic Metabolic Panel: Recent Labs  Lab 12/19/20 0616 12/20/20 0231 12/21/20 0241 12/22/20 0202 12/23/20 0509 12/24/20 0605  NA 138 139  --  137 133* 136  K 4.3 3.8  --  3.4* 3.3* 3.5  CL 106 103  --  91* 90* 94*  CO2 21* 27  --  33* 29 30  GLUCOSE 207* 108*  --  151* 218* 165*  BUN 46* 49*  --  56* 84* 91*  CREATININE 3.64* 3.59* 4.28* 4.10* 4.86* 5.63*  CALCIUM 8.8* 8.2*  --  7.8* 7.8* 7.6*  MG  --  1.8  --  2.1  --   --   PHOS  --  3.7  --  5.6*  --   --    Liver Function Tests: Recent Labs  Lab 12/18/20 0834 12/19/20 0616  AST 28 36  ALT 15 17  ALKPHOS 91 93  BILITOT 1.0 1.0  PROT 7.0 7.3  ALBUMIN 3.3* 2.9*   No results for input(s): LIPASE, AMYLASE in the last 168 hours. No results for input(s): AMMONIA in the last 168 hours. Cardiac Enzymes: Recent Labs  Lab 12/18/20 0834 12/20/20 0231  CKTOTAL 804* 286*   BNP (last 3 results) No results for input(s): BNP in the last 8760 hours.  ProBNP (last 3 results) No results for input(s): PROBNP in the last 8760 hours.  CBG: Recent Labs  Lab 12/23/20 1558 12/23/20 1934 12/24/20 0003 12/24/20 0436 12/24/20 0734  GLUCAP 262* 249* 209* 164* 158*   Recent Results (from the past 240  hour(s))  Resp Panel by RT-PCR (Flu A&B, Covid) Nasopharyngeal  Swab     Status: None   Collection Time: 12/18/20  5:19 PM   Specimen: Nasopharyngeal Swab; Nasopharyngeal(NP) swabs in vial transport medium  Result Value Ref Range Status   SARS Coronavirus 2 by RT PCR NEGATIVE NEGATIVE Final    Comment: (NOTE) SARS-CoV-2 target nucleic acids are NOT DETECTED.  The SARS-CoV-2 RNA is generally detectable in upper respiratory specimens during the acute phase of infection. The lowest concentration of SARS-CoV-2 viral copies this assay can detect is 138 copies/mL. A negative result does not preclude SARS-Cov-2 infection and should not be used as the sole basis for treatment or other patient management decisions. A negative result may occur with  improper specimen collection/handling, submission of specimen other than nasopharyngeal swab, presence of viral mutation(s) within the areas targeted by this assay, and inadequate number of viral copies(<138 copies/mL). A negative result must be combined with clinical observations, patient history, and epidemiological information. The expected result is Negative.  Fact Sheet for Patients:  EntrepreneurPulse.com.au  Fact Sheet for Healthcare Providers:  IncredibleEmployment.be  This test is no t yet approved or cleared by the [place] FDA and  has been authorized for detection and/or diagnosis of SARS-CoV-2 by FDA under an Emergency Use Authorization (EUA). This EUA will remain  in effect (meaning this test can be used) for the duration of the COVID-19 declaration under Section 564(b)(1) of the Act, 21 U.S.C.section 360bbb-3(b)(1), unless the authorization is terminated  or revoked sooner.       Influenza A by PCR NEGATIVE NEGATIVE Final   Influenza B by PCR NEGATIVE NEGATIVE Final    Comment: (NOTE) The Xpert Xpress SARS-CoV-2/FLU/RSV plus assay is intended as an aid in the diagnosis of influenza from Nasopharyngeal swab specimens and should not be used as a sole  basis for treatment. Nasal washings and aspirates are unacceptable for Xpert Xpress SARS-CoV-2/FLU/RSV testing.  Fact Sheet for Patients: EntrepreneurPulse.com.au  Fact Sheet for Healthcare Providers: IncredibleEmployment.be  This test is not yet approved or cleared by the [place] FDA and has been authorized for detection and/or diagnosis of SARS-CoV-2 by FDA under an Emergency Use Authorization (EUA). This EUA will remain in effect (meaning this test can be used) for the duration of the COVID-19 declaration under Section 564(b)(1) of the Act, 21 U.S.C. section 360bbb-3(b)(1), unless the authorization is terminated or revoked.  Performed at [facility], [address] [place], [place] [address]   Urine Culture     Status: Abnormal   Collection Time: 12/18/20  5:30 PM   Specimen: Urine, Clean Catch  Result Value Ref Range Status   Specimen Description   Final    URINE, CLEAN CATCH Performed at [facility], [address], [place], [place] [address]    Special Requests   Final    NONE Performed at [facility], [address], [place], [place] [address]    Culture (A)  Final    <10,000 COLONIES/mL INSIGNIFICANT GROWTH Performed at [facility] [address] [place] [address], [place], [place] [address]    Report Status 12/19/2020 FINAL  Final  Blood culture (routine x 2)     Status: None (Preliminary result)   Collection Time: 12/18/20 10:30 PM   Specimen: BLOOD  Result Value Ref Range Status   Specimen Description BLOOD BLOOD RIGHT HAND  Final   Special Requests   Final    BOTTLES DRAWN AEROBIC AND ANAEROBIC Blood Culture adequate  volume   Culture   Final    NO GROWTH 4 DAYS Performed at [facility], [address] [place], [place] [address]    Report Status PENDING  Incomplete  Blood culture (routine x 2)     Status: None (Preliminary result)   Collection Time: 12/18/20 10:35 PM   Specimen:  BLOOD  Result Value Ref Range Status   Specimen Description BLOOD BLOOD LEFT HAND  Final   Special Requests   Final    BOTTLES DRAWN AEROBIC AND ANAEROBIC Blood Culture adequate volume   Culture   Final    NO GROWTH 4 DAYS Performed at [facility], [address] [place], [place] [address]    Report Status PENDING  Incomplete  VZV PCR, CSF     Status: None   Collection Time: 12/19/20 10:52 AM   Specimen: Cerebrospinal Fluid  Result Value Ref Range Status   VZV PCR, CSF Negative Negative Final    Comment: (NOTE) No Varicella Zoster Virus DNA detected. Performed At: [facility] [place] [facility], [place] [id number] Rush Farmer MD [id number]   CSF culture w Gram Stain     Status: None   Collection Time: 12/20/20  9:45 AM   Specimen: PATH Cytology CSF; Cerebrospinal Fluid  Result Value Ref Range Status   Specimen Description   Final    CSF Performed at [facility], [address], [place], [place] [address]    Special Requests   Final    NONE Performed at [facility], [address] [place], [place] [address]    Gram Stain   Final    WBC SEEN NO RBC SEEN NO ORGANISMS SEEN Performed at [facility] [address] [place], [place] [address]    Culture   Final    NO GROWTH 3 DAYS Performed at [facility], [place] [address] [place], [place] [address]    Report Status 12/23/2020 FINAL  Final     Studies: US RENAL  Result Date: 12/22/2020 CLINICAL DATA:  Acute kidney injury, history diabetes mellitus, COPD, discoid lupus, hypertension, coronary artery disease, former smoker, breast cancer EXAM: RENAL / URINARY TRACT ULTRASOUND COMPLETE COMPARISON:  None FINDINGS: Right Kidney: Renal measurements: 10.1 x 5.0 x 5.4 cm = volume: 140 mL. Normal cortical thickness. Slightly increased cortical echogenicity. Tiny cyst at upper pole 13 x 12 x 12 mm. Small cyst mid kidney 18 x 17 x 16 mm. Tiny cyst mid kidney 11 mm  diameter. No solid mass or hydronephrosis. No shadowing calcifications. Left Kidney: Renal measurements: 10.1 x 5.6 x 5.3 cm = volume: 153 mL. Normal cortical thickness. Increased cortical echogenicity. No mass, hydronephrosis, or shadowing calcification. Bladder: Appears normal for degree of bladder distention. LEFT ureteral jet was visualized. Other: N/A IMPRESSION: Medical renal disease changes of both kidneys. Small RIGHT renal cysts. Electronically Signed   By: Lavonia Dana M.D.   On: 12/22/2020 17:20   [place] UE VEIN MAPPING LEFT (PRE-OP AVF)  Result Date: 12/22/2020 CLINICAL DATA:  Preop evaluation prior to AV fistula creation EXAM: [place] EXTREM UP VEIN MAPPING COMPARISON:  None. FINDINGS: LEFT ARTERIES Wrist Radial Artery: Size 23mm Waveform monophasic Wrist Ulnar Artery: Size 87mm Waveform monophasic Prox. Forearm Radial Artery: Size 52mm Waveform triphasic Upper Arm Brachial Artery: Size 16mm Waveform triphasic LEFT VEINS Forearm Cephalic Vein: Prox 30mm Distal 34mm Depth 2-6ZT Upper Arm Cephalic Vein: Prox thrombosed distal thrombosed depth 24PY Upper Arm Basilic Vein: Prox 36mm Distal 55mm  Depth 14-82mm Upper Arm Brachial Vein: Prox 68mm Distal 69mm Depth 14-8mm ADDITIONAL LEFT VEINS Axillary Vein:  65mm Subclavian Vein: Patient: Yes Respiratory Phasicity: Present Internal Jugular Vein: Patent: Yes    Respiratory Phasicity: Present IMPRESSION: 1. Monophasic flow seen in the radial and ulnar arteries at the level of the wrist. 2. Upper arm cephalic vein is thrombosed. Electronically Signed   By: Miachel Roux M.D.   On: 12/22/2020 16:26    Total time spent: 30 minutes  Barb Merino, MD  [facility] 12/24/2020  If 7PM-7AM, please contact night-coverage

## 2020-12-25 LAB — HEPATITIS B SURFACE ANTIGEN: Hepatitis B Surface Ag: NONREACTIVE

## 2020-12-25 LAB — GLUCOSE, CAPILLARY
Glucose-Capillary: 249 mg/dL — ABNORMAL HIGH (ref 70–99)
Glucose-Capillary: 292 mg/dL — ABNORMAL HIGH (ref 70–99)
Glucose-Capillary: 411 mg/dL — ABNORMAL HIGH (ref 70–99)
Glucose-Capillary: 317 mg/dL — ABNORMAL HIGH (ref 70–99)

## 2020-12-25 LAB — RENAL FUNCTION PANEL
Albumin: 2.3 g/dL — ABNORMAL LOW (ref 3.5–5.0)
Anion gap: 11 (ref 5–15)
BUN: 99 mg/dL — ABNORMAL HIGH (ref 8–23)
CO2: 31 mmol/L (ref 22–32)
Calcium: 7.3 mg/dL — ABNORMAL LOW (ref 8.9–10.3)
Chloride: 93 mmol/L — ABNORMAL LOW (ref 98–111)
Creatinine, Ser: 6.12 mg/dL — ABNORMAL HIGH (ref 0.44–1.00)
GFR, Estimated: 6 mL/min — ABNORMAL LOW (ref >60)
Glucose, Bld: 260 mg/dL — ABNORMAL HIGH (ref 70–99)
Phosphorus: 6.4 mg/dL — ABNORMAL HIGH (ref 2.5–4.6)
Potassium: 3.3 mmol/L — ABNORMAL LOW (ref 3.5–5.1)
Sodium: 135 mmol/L (ref 135–145)

## 2020-12-25 LAB — HEPATITIS B CORE ANTIBODY, TOTAL: Hep B Core Total Ab: NONREACTIVE

## 2020-12-25 MED ORDER — INSULIN GLARGINE-YFGN 100 UNIT/ML ~~LOC~~ SOLN
8.0000 [IU] | Freq: Every day | SUBCUTANEOUS | Status: DC
  Administered 2020-12-25 – 2020-12-31 (×7): 8 [IU] via SUBCUTANEOUS
  Filled 2020-12-25 (×8): qty 0.08

## 2020-12-25 MED ORDER — INSULIN ASPART 100 UNIT/ML IJ SOLN
6.0000 [IU] | Freq: Once | INTRAMUSCULAR | Status: AC
  Administered 2020-12-25: 17:00:00 6 [IU] via SUBCUTANEOUS
  Filled 2020-12-25: qty 1

## 2020-12-25 NOTE — Progress Notes (Addendum)
[facility]  ROUNDING NOTE   Subjective:   Jennifer Carrillo is a 87 y.o. female with past medical concerns including hypertension, diabetes, CAD, shingles, breast CA, and chronic kidney disease stage 4. She presented to the ed with pain extending from her back to her legs and was not able to get out of bed. Patient was admitted for Bilateral leg pain [M79.604, M79.605] Sepsis ([place]) [A41.9] Acute bilateral low back pain, unspecified whether sciatica present [M54.50] Sepsis without acute organ dysfunction, due to unspecified organism [facility]) [A41.9]  Patient is currently known to our office and is followed by Dr Candiss Norse. She was last seen in the office on 12/12/20.   Update:  Patient sitting up in bed eating breakfast Alert Tolerating meals without nausea and vomiting Denies shortness of breath  Creatinine 6.12 Urine output 1.1L overnight  Objective:  Vital signs in last 24 hours:  Temp:  [98.2 F (36.8 C)-98.7 F (37.1 C)] 98.3 F (36.8 C) (12/18 0755) Pulse Rate:  [64-70] 67 (12/18 0755) Resp:  [16-24] 16 (12/18 0755) BP: (144-186)/(40-57) 186/49 (12/18 0755) SpO2:  [93 %-99 %] 94 % (12/18 0755)  Weight change:  Filed Weights   12/18/20 0751 12/18/20 0755 12/19/20 2056  Weight: 82 kg 113.4 kg 80.5 kg    Intake/Output: I/O last 3 completed shifts: In: 720 [P.O.:720] Out: 1300 [Urine:1300]   Intake/Output this shift:  No intake/output data recorded.  Physical Exam: General: NAD, sitting up in bed  Head: Normocephalic, atraumatic. Moist oral mucosal membranes  Eyes: Anicteric  Lungs:  Clear to auscultation, normal effort  Heart: Regular rate and rhythm  Abdomen:  Soft, nontender, nondistended  Extremities:  no peripheral edema.  Neurologic: Nonfocal, moving all four extremities  Skin: No lesions       Basic Metabolic Panel: Recent Labs  Lab 12/20/20 0231 12/21/20 0241 12/22/20 0202 12/23/20 0509 12/24/20 0605 12/25/20 0906  NA 139  --  137  133* 136 135  K 3.8  --  3.4* 3.3* 3.5 3.3*  CL 103  --  91* 90* 94* 93*  CO2 27  --  33* 29 30 31   GLUCOSE 108*  --  151* 218* 165* 260*  BUN 49*  --  56* 84* 91* 99*  CREATININE 3.59* 4.28* 4.10* 4.86* 5.63* 6.12*  CALCIUM 8.2*  --  7.8* 7.8* 7.6* 7.3*  MG 1.8  --  2.1  --   --   --   PHOS 3.7  --  5.6*  --   --  6.4*     Liver Function Tests: Recent Labs  Lab 12/19/20 0616 12/25/20 0906  AST 36  --   ALT 17  --   ALKPHOS 93  --   BILITOT 1.0  --   PROT 7.3  --   ALBUMIN 2.9* 2.3*    No results for input(s): LIPASE, AMYLASE in the last 168 hours. No results for input(s): AMMONIA in the last 168 hours.  CBC: Recent Labs  Lab 12/19/20 0616 12/20/20 0231 12/22/20 0202  WBC 19.2* 14.4* 12.9*  NEUTROABS  --   --  10.7*  HGB 10.3* 9.2* 9.1*  HCT 33.3* 28.2* 28.0*  MCV 84.1 82.5 81.9  PLT 337 292 347     Cardiac Enzymes: Recent Labs  Lab 12/20/20 0231  CKTOTAL 286*     BNP: Invalid input(s): POCBNP  CBG: Recent Labs  Lab 12/24/20 0734 12/24/20 1134 12/24/20 1544 12/24/20 2012 12/25/20 0752  GLUCAP 158* 217* 228* 314* 249*  Microbiology: Results for orders placed or performed during the hospital encounter of 12/18/20  Resp Panel by RT-PCR (Flu A&B, Covid) Nasopharyngeal Swab     Status: None   Collection Time: 12/18/20  5:19 PM   Specimen: Nasopharyngeal Swab; Nasopharyngeal(NP) swabs in vial transport medium  Result Value Ref Range Status   SARS Coronavirus 2 by RT PCR NEGATIVE NEGATIVE Final    Comment: (NOTE) SARS-CoV-2 target nucleic acids are NOT DETECTED.  The SARS-CoV-2 RNA is generally detectable in upper respiratory specimens during the acute phase of infection. The lowest concentration of SARS-CoV-2 viral copies this assay can detect is 138 copies/mL. A negative result does not preclude SARS-Cov-2 infection and should not be used as the sole basis for treatment or other patient management decisions. A negative result may occur  with  improper specimen collection/handling, submission of specimen other than nasopharyngeal swab, presence of viral mutation(s) within the areas targeted by this assay, and inadequate number of viral copies(<138 copies/mL). A negative result must be combined with clinical observations, patient history, and epidemiological information. The expected result is Negative.  Fact Sheet for Patients:  EntrepreneurPulse.com.au  Fact Sheet for Healthcare Providers:  IncredibleEmployment.be  This test is no t yet approved or cleared by the [place] FDA and  has been authorized for detection and/or diagnosis of SARS-CoV-2 by FDA under an Emergency Use Authorization (EUA). This EUA will remain  in effect (meaning this test can be used) for the duration of the COVID-19 declaration under Section 564(b)(1) of the Act, 21 U.S.C.section 360bbb-3(b)(1), unless the authorization is terminated  or revoked sooner.       Influenza A by PCR NEGATIVE NEGATIVE Final   Influenza B by PCR NEGATIVE NEGATIVE Final    Comment: (NOTE) The Xpert Xpress SARS-CoV-2/FLU/RSV plus assay is intended as an aid in the diagnosis of influenza from Nasopharyngeal swab specimens and should not be used as a sole basis for treatment. Nasal washings and aspirates are unacceptable for Xpert Xpress SARS-CoV-2/FLU/RSV testing.  Fact Sheet for Patients: EntrepreneurPulse.com.au  Fact Sheet for Healthcare Providers: IncredibleEmployment.be  This test is not yet approved or cleared by the [place] FDA and has been authorized for detection and/or diagnosis of SARS-CoV-2 by FDA under an Emergency Use Authorization (EUA). This EUA will remain in effect (meaning this test can be used) for the duration of the COVID-19 declaration under Section 564(b)(1) of the Act, 21 U.S.C. section 360bbb-3(b)(1), unless the authorization is terminated  or revoked.  Performed at [facility], [address] [place], [place] [address]   Urine Culture     Status: Abnormal   Collection Time: 12/18/20  5:30 PM   Specimen: Urine, Clean Catch  Result Value Ref Range Status   Specimen Description   Final    URINE, CLEAN CATCH Performed at [facility], [address], [place], [place] [address]    Special Requests   Final    NONE Performed at [facility], [address], [place], [place] [address]    Culture (A)  Final    <10,000 COLONIES/mL INSIGNIFICANT GROWTH Performed at [facility] [address], [place],  [address]    Report Status 12/19/2020 FINAL  Final  Blood culture (routine x 2)     Status: None (Preliminary result)   Collection Time: 12/18/20 10:30 PM   Specimen: BLOOD  Result Value Ref Range Status   Specimen Description BLOOD BLOOD RIGHT HAND  Final   Special Requests  Final    BOTTLES DRAWN AEROBIC AND ANAEROBIC Blood Culture adequate volume   Culture   Final    NO GROWTH 4 DAYS Performed at [facility], [address] [place], [place] [address]    Report Status PENDING  Incomplete  Blood culture (routine x 2)     Status: None (Preliminary result)   Collection Time: 12/18/20 10:35 PM   Specimen: BLOOD  Result Value Ref Range Status   Specimen Description BLOOD BLOOD LEFT HAND  Final   Special Requests   Final    BOTTLES DRAWN AEROBIC AND ANAEROBIC Blood Culture adequate volume   Culture   Final    NO GROWTH 4 DAYS Performed at [facility], [address], [place], [place] [address]    Report Status PENDING  Incomplete  VZV PCR, CSF     Status: None   Collection Time: 12/19/20 10:52 AM   Specimen: Cerebrospinal Fluid  Result Value Ref Range Status   VZV PCR, CSF Negative Negative Final    Comment: (NOTE) No Varicella Zoster Virus DNA detected. Performed At: [facility] [place], [place] [id number] Rush Farmer MD ZH:[id number]   CSF culture w Gram Stain     Status: None   Collection Time: 12/20/20  9:45 AM   Specimen: PATH Cytology CSF; Cerebrospinal Fluid  Result Value Ref Range Status   Specimen Description   Final    CSF Performed at [facility], [address], [place], [place] [address]    Special Requests   Final    NONE Performed at [facility], [address] [place], [place] [address]    Gram Stain   Final    WBC SEEN NO RBC SEEN NO ORGANISMS SEEN Performed at [facility], [address] [place], [place] [address]    Culture   Final    NO GROWTH 3 DAYS Performed at [facility], [address] [address], [place], [place] [address]    Report Status 12/23/2020 FINAL  Final    Coagulation Studies: No results for input(s): LABPROT, INR in the last 72 hours.  Urinalysis: No results for input(s): COLORURINE, LABSPEC, PHURINE, GLUCOSEU, HGBUR, BILIRUBINUR, KETONESUR, PROTEINUR, UROBILINOGEN, NITRITE, LEUKOCYTESUR in the last 72 hours.  Invalid input(s): APPERANCEUR    Imaging: No results found.   Medications:     amLODipine  10 mg Oral Daily   anastrozole  1 mg Oral QPC supper   atorvastatin  40 mg Oral Daily   clopidogrel  75 mg Oral Daily   digoxin  0.0625 mg Oral Q48H   docusate sodium  100 mg Oral BID   donepezil  10 mg Oral QHS   heparin injection (subcutaneous)  5,000 Units Subcutaneous Q8H   hydrALAZINE  100 mg Oral Q8H   insulin aspart  0-5 Units Subcutaneous QHS   insulin aspart  0-9 Units Subcutaneous TID WC   isosorbide mononitrate  60 mg Oral Daily   mometasone-formoterol  2 puff Inhalation BID   predniSONE  20 mg Oral Q breakfast   senna  1 tablet Oral Daily   acetaminophen **OR** acetaminophen, docusate sodium, hydrALAZINE, labetalol, ondansetron **OR** ondansetron (ZOFRAN) IV, traMADol  Assessment/ Plan:  Ms. Jennifer Carrillo is a 87 y.o.  female with past medical concerns including hypertension, diabetes,  CAD, shingles, breast CA, and chronic kidney disease stage 4. She presented to the ed with pain extending from her back to her legs and was not able to  get out of bed. Patient was admitted for Bilateral leg pain [M79.604, M79.605] Sepsis ([place]) [A41.9] Acute bilateral low back pain, unspecified whether sciatica present [M54.50] Sepsis without acute organ dysfunction, due to unspecified organism [facility]) [A41.9]   Acute Kidney Injury on chronic kidney disease stage V with baseline creatinine 3.83 and GFR of 11 on 11/26/20.  Acute kidney injury secondary to dehydration and rhabdo No IV contrast exposure.  Telmisartan held.   Renal ultrasound negative for obstruction, does show right renal cyst.  -Creatinine 6.1 today with more than 1L UOP overnight.   -Patient would like to continue to discuss peritoneal dialysis with her husband. If agreeable, will consult surgery for placement.   - Will order hepatitis labs to prepare for renal replacement therapy  Lab Results  Component Value Date   CREATININE 6.12 (H) 12/25/2020   CREATININE 5.63 (H) 12/24/2020   CREATININE 4.86 (H) 12/23/2020    Intake/Output Summary (Last 24 hours) at 12/25/2020 1040 Last data filed at 12/25/2020 0559 Gross per 24 hour  Intake 480 ml  Output 1100 ml  Net -620 ml    2. Anemia of chronic kidney disease Lab Results  Component Value Date   HGB 9.1 (L) 12/22/2020    Hgb within acceptable range  3. Secondary Hyperparathyroidism: with outpatient labs:phosphorus 3.5, calcium 9.3 on 11/07/20.   Lab Results  Component Value Date   PTH 35 06/27/2018   CALCIUM 7.3 (L) 12/25/2020   PHOS 6.4 (H) 12/25/2020    Calcium and phosphorus not at desired goal. Will continue to monitor   4. Diabetes mellitus type II with chronic kidney disease insulin dependent. Home regimen includes NPH. Most recent hemoglobin A1c is 5.3 on 12/18/20. Glucose remains elevated. Primary team to manage sliding scale insulin   5.  Hypertension  with chronic kidney disease.  Home regimen includes hydrochlorothiazide, isosorbide, telmisartan, amlodipine, and hydralazine.  Telmisartan and HCTZ currently held.  - BP elevated 186/49   LOS: 7   12/18/202210:40 AM

## 2020-12-25 NOTE — Progress Notes (Signed)
Physical Therapy Treatment Patient Details Name: Jennifer Carrillo MRN: [id number] DOB: [birth year] Today's Date: 12/25/2020   History of Present Illness Jennifer Carrillo is an 49yoF who comes to [facility] on 12/11 from home with weakness adn back/leg pain. Pt admitted with sepsis of unknown source. PMH: DM, HTN, CKD4, CAD, Shingles on 12/05/20 s/p valtrex, CA breast on anastrozole, hyperlipidemia, OSA, discoid lupus. Etiology appears non-infectious per ID, now consulting rheumatology.    PT Comments    Pt tolerated treatment well today and was able to improve overall assist levels, cognition, command following, balance, ambulation distance, pain levels, and activity tolerance since last session. Was A&O x4, needing cues for year. Required supervision for bed mobility, mod-max assist for transfers, and min assist for short distance gait with RW. Further mobility limited secondary to fatigue and c/o LE weakness. Able to maintain static standing balance with CGA while PT assisted with pericare after BM on BSC. Despite progress, she continues to be limited with meeting goals due to decreased standing balance, generalized weakness, and decreased activity tolerance. Deficits increase the pt's risk of falls. Left in recliner with needs in reach; educated to call for help. Pt will continue to benefit from skilled acute PT services to address deficits for return to baseline function. Will continue to recommend SNF at DC.    Recommendations for follow up therapy are one component of a multi-disciplinary discharge planning process, led by the attending physician.  Recommendations may be updated based on patient status, additional functional criteria and insurance authorization.  Follow Up Recommendations  Skilled nursing-short term rehab (<3 hours/day)     Assistance Recommended at Discharge Set up [place] Recommendations  None recommended by PT       Precautions / Restrictions  Precautions Precautions: Fall Restrictions Weight Bearing Restrictions: No     Mobility  Bed Mobility   Bed Mobility: Supine to Sit     Supine to sit: Supervision;HOB elevated     General bed mobility comments: supervision to sit EOB with HOB elevated and use of BUE for support; increased time/effort    Transfers Overall transfer level: Needs assistance Equipment used: Rolling walker (2 wheels) Transfers: Sit to/from Stand Sit to Stand: Max assist;Mod assist           General transfer comment: Max assist for power to stand from EOB and mod assist for power to stand from [facility] with RW; increased time/effort for transition of UE from EOB>RW and to achieve full upright standing. Verbal cues for safety, sequencing, and hand placement. Demonstrates R genu valgum and increased knee flexion; denies pain    Ambulation/Gait Ambulation/Gait assistance: Min assist Gait Distance (Feet): 4 Feet (3 lateral steps x2 (EOB>BSC>recliner)) Assistive device: Rolling walker (2 wheels)         General Gait Details: min assist for balance/RW negotiation to take a few steps x2 from EOB>BSC>recliner. Demonstrates narrow BOS, slowed cadence, decreased step length/foot clearance bil, R genu valgum with increased knee flexion. Increased difficulty and labored breathing; denies pain.     Balance Overall balance assessment: Needs assistance Sitting-balance support: Bilateral upper extremity supported;Feet supported Sitting balance-Leahy Scale: Fair Sitting balance - Comments: no LOB EOB or on BSC     Standing balance-Leahy Scale: Poor Standing balance comment: min assist for gait; able to maintain static standing with BUE for support while PT performed pericare  Cognition Arousal/Alertness: Awake/alert Behavior During Therapy: WFL for tasks assessed/performed Overall Cognitive Status: History of cognitive impairments - at baseline                                  General Comments: A&O x4; required cues for year. Able to follow 100% of simple 2-step commands.        Exercises Other Exercises Other Exercises: Participated in bed mobility, transfers, and minimal gait. Increased assist for upright mobility and pericare. Other Exercises: Pt educated re: PT role/POC, DC recommendations, safety with mobility, LE weakness.    General Comments General comments (skin integrity, edema, etc.): assisted to [facility], assisted with pericare      Pertinent Vitals/Pain Pain Assessment: No/denies pain     PT Goals (current goals can now be found in the care plan section) Acute Rehab PT Goals Patient Stated Goal: less pain, more strong PT Goal Formulation: With patient Time For Goal Achievement: 01/04/21 Potential to Achieve Goals: Fair Progress towards PT goals: Progressing toward goals    Frequency    Min 2X/week      PT Plan Current plan remains appropriate       AM-PAC PT "6 Clicks" Mobility   Outcome Measure  Help needed turning from your back to your side while in a flat bed without using bedrails?: A Little Help needed moving from lying on your back to sitting on the side of a flat bed without using bedrails?: A Little Help needed moving to and from a bed to a chair (including a wheelchair)?: A Little Help needed standing up from a chair using your arms (e.g., wheelchair or bedside chair)?: A Lot Help needed to walk in hospital room?: A Little Help needed climbing 3-5 steps with a railing? : Total 6 Click Score: 15    End of Session Equipment Utilized During Treatment: Gait belt Activity Tolerance: Patient tolerated treatment well;Patient limited by fatigue Patient left: in chair;with call bell/phone within reach;with chair alarm set Nurse Communication: Mobility status PT Visit Diagnosis: Unsteadiness on feet (R26.81);Difficulty in walking, not elsewhere classified (R26.2);Other abnormalities of gait and mobility  (R26.89);Muscle weakness (generalized) (M62.81)     Time: 3244-0102 PT Time Calculation (min) (ACUTE ONLY): 30 min  Charges:  $Therapeutic Activity: 23-37 mins                       Herminio Commons, PT, DPT 10:19 AM,12/25/20

## 2020-12-25 NOTE — Hospital Course (Signed)
87 y.o. female with past medical history of diabetes mellitus type 2, hypertension, CKD stage IV, coronary artery disease, history of shingles diagnosed on 11/28 treated with Valtrex and gabapentin presented to hospital with 1 day history of diffuse pain extending from her lower back to her legs to the level where she was unable to get out of the bed.  In the emergency room, she was febrile.  Blood pressures were elevated.  Creatinine 4.1.  Baseline creatinine about 3.8-4.  Admitted with broad-spectrum antibiotics.   Remains in the hospital waiting for SNF bed as well as worsening renal function, anticipate initiation of dialysis prior to discharge.

## 2020-12-25 NOTE — Progress Notes (Signed)
PROGRESS NOTE  JAMIESON LISA MHD:[id number] DOB: 1933-12-10 DOA: 12/18/2020 PCP: [name], MD   LOS: 7 days   Brief narrative: 87 y.o. female with past medical history of diabetes mellitus type 2, hypertension, CKD stage IV, coronary artery disease, history of shingles diagnosed on 11/28 treated with Valtrex and gabapentin presented to hospital with 1 day history of diffuse pain extending from her lower back to her legs to the level where she was unable to get out of the bed.  In the emergency room, she was febrile.  Blood pressures were elevated.  Creatinine 4.1.  Baseline creatinine about 3.8-4.  Admitted with broad-spectrum antibiotics.  Remains in the hospital waiting for SNF bed as well as worsening renal functions.    Assessment/Plan:  Principal Problem:   Sepsis ([place]) Active Problems:   PAD (peripheral artery disease) ([employer])   Coronary artery disease involving native coronary artery of native heart   Anemia of chronic kidney failure, stage 4 (severe) (H[facility])   Acute kidney injury superimposed on CKD IV ([facility])   Metabolic acidosis   Rhabdomyolysis   Hypertensive emergency without congestive heart failure   Hyperglycemia due to type 2 diabetes mellitus ([place])   Shingles 12/05/2020    Severe sepsis present on admission with altered mental status and acute kidney injury: Likely because polyarthritis.   Presented with fever, tachycardia and leukocytosis.   Recent history of treated shingles.   Procalcitonin normal.  Lactate normal.   Lumbar puncture without evidence of meningitis, antibiotics discontinued  MRI lumbosacral spine negative for infection.   Urinalysis was unremarkable.   Blood cultures negative so far.   Polyarthritis, seen by rheumatology.     Bilateral knee aspirate with pseudogout, negative cultures.  Steroid injections.   Started patient on low-dose prednisone and taper over 1 week.   Bilateral x-ray with extensive osteoarthritis.   Use low-dose tramadol  for pain relief.  Altered mental status likely metabolic infective encephalopathy. Adequately improved.  Mental status improved.   Acute kidney injury superimposed on CKD IV with metabolic acidosis Progressive azotemia.   Renal ultrasound with no evidence of obstruction. Creatinine continues to rise. Followed by nephrology.  Monitoring with watchful expectancy and possible dialysis soon.    Mild cognitive dysfunction with metabolic encephalopathy. At baseline.  Continue delirium precautions.  Resumed Aricept.   Rhabdomyolysis, nontraumatic Improved.   Hypertensive urgency.  BP high of 233/89 condition.  Has improved with initiation of medication.  We will continue to monitor.  Hyperglycemia due to type 2 diabetes mellitus  Uncontrolled today.  Add basal insulin.  On sliding scale. Likely add scheduled mealtime coverage tomorrow depending on response to basal.   History of shingles 12/05/2020 Completed Valtrex.   PAD (peripheral artery disease Stable.   Coronary artery disease involving native coronary artery of native heart Patient on digoxin, Ranexa, statin and Plavix. Digoxin level therapeutic, continue with renal dosing. With worsening renal functions, Ranexa is contraindicated.  Discontinue.  History of breast cancer Continue Arimidex from home.  Generalized weakness at home.  Eval by PT OT.  Recommended SNF.  Stable for transfer if cleared by nephrology.  DVT prophylaxis: heparin injection 5,000 Units Start: 12/22/20 1600 SCDs Start: 12/18/20 2238   Code Status: Full code  Family Communication:  Patient's husband at the bedside 12/18.  Status is: Inpatient  Remains inpatient appropriate because: Worsening renal function, anticipate starting dialysis.    Consultants: Infectious disease Nephrology Rheumatology  Procedures: Lumbar puncture Bilateral knee aspirate and steroid injections  Anti-infectives:  Vancomycin,ampicillin and acyclovir  12/11-12/13 Rocephin 12/11--- 12/14  Anti-infectives (From admission, onward)    Start     Dose/Rate Route Frequency Ordered Stop   12/21/20 1200  cefTRIAXone (ROCEPHIN) 2 g in sodium chloride 0.9 % 100 mL IVPB  Status:  Discontinued        2 g 200 mL/hr over 30 Minutes Intravenous Every 24 hours 12/20/20 1805 12/21/20 1901   12/21/20 0200  vancomycin (VANCOREADY) IVPB 750 mg/150 mL  Status:  Discontinued        750 mg 150 mL/hr over 60 Minutes Intravenous Every 48 hours 12/18/20 2312 12/20/20 1416   12/20/20 2200  vancomycin (VANCOREADY) IVPB 750 mg/150 mL  Status:  Discontinued        750 mg 150 mL/hr over 60 Minutes Intravenous Every 48 hours 12/20/20 1416 12/20/20 1805   12/19/20 2200  ceFEPIme (MAXIPIME) 2 g in sodium chloride 0.9 % 100 mL IVPB  Status:  Discontinued        2 g 200 mL/hr over 30 Minutes Intravenous Every 24 hours 12/18/20 2309 12/19/20 1114   12/19/20 2200  cefTRIAXone (ROCEPHIN) 2 g in sodium chloride 0.9 % 100 mL IVPB  Status:  Discontinued        2 g 200 mL/hr over 30 Minutes Intravenous Every 12 hours 12/19/20 1114 12/20/20 1805   12/19/20 1130  ampicillin (OMNIPEN) 2 g in sodium chloride 0.9 % 100 mL IVPB  Status:  Discontinued        2 g 300 mL/hr over 20 Minutes Intravenous Every 12 hours 12/19/20 1116 12/20/20 1805   12/19/20 0200  vancomycin (VANCOCIN) IVPB 1000 mg/200 mL premix       See Hyperspace for full Linked Orders Report.   1,000 mg 200 mL/hr over 60 Minutes Intravenous  Once 12/19/20 0042 12/19/20 0204   12/19/20 0200  vancomycin (VANCOREADY) IVPB 1500 mg/300 mL       See Hyperspace for full Linked Orders Report.   1,500 mg 150 mL/hr over 120 Minutes Intravenous  Once 12/19/20 0042 12/19/20 0453   12/19/20 0130  acyclovir (ZOVIRAX) 825 mg in dextrose 5 % 150 mL IVPB  Status:  Discontinued        10 mg/kg  82.3 kg (Adjusted) 166.5 mL/hr over 60 Minutes Intravenous Every 24 hours 12/19/20 0044 12/20/20 1825   12/18/20 2330  acyclovir (ZOVIRAX)  825 mg in dextrose 5 % 150 mL IVPB  Status:  Discontinued        10 mg/kg  82.3 kg (Adjusted) 166.5 mL/hr over 60 Minutes Intravenous Every 24 hours 12/18/20 2318 12/19/20 0044   12/18/20 2300  metroNIDAZOLE (FLAGYL) IVPB 500 mg  Status:  Discontinued        500 mg 100 mL/hr over 60 Minutes Intravenous Every 12 hours 12/18/20 2251 12/19/20 1115   12/18/20 2230  vancomycin (VANCOREADY) IVPB 1500 mg/300 mL  Status:  Discontinued       See Hyperspace for full Linked Orders Report.   1,500 mg 150 mL/hr over 120 Minutes Intravenous  Once 12/18/20 2123 12/19/20 0042   12/18/20 2130  ceFEPIme (MAXIPIME) 2 g in sodium chloride 0.9 % 100 mL IVPB        2 g 200 mL/hr over 30 Minutes Intravenous  Once 12/18/20 2117 12/19/20 0000   12/18/20 2130  metroNIDAZOLE (FLAGYL) IVPB 500 mg  Status:  Discontinued        500 mg 100 mL/hr over 60 Minutes Intravenous  Once 12/18/20 2117 12/18/20  2342   12/18/20 2130  vancomycin (VANCOCIN) IVPB 1000 mg/200 mL premix  Status:  Discontinued        1,000 mg 200 mL/hr over 60 Minutes Intravenous  Once 12/18/20 2117 12/18/20 2123   12/18/20 2130  vancomycin (VANCOCIN) IVPB 1000 mg/200 mL premix  Status:  Discontinued       See Hyperspace for full Linked Orders Report.   1,000 mg 200 mL/hr over 60 Minutes Intravenous  Once 12/18/20 2123 12/19/20 0042       Subjective:  Patient seen with husband at bedside. She reports overall feeling better, just very weak.  No other acute complaints.   Objective: Vitals:   12/25/20 0755 12/25/20 1532  BP: (!) 186/49 (!) 161/50  Pulse: 67 69  Resp: 16 18  Temp: 98.3 F (36.8 C) 98.1 F (36.7 C)  SpO2: 94% 96%    Intake/Output Summary (Last 24 hours) at 12/25/2020 1707 Last data filed at 12/25/2020 1427 Gross per 24 hour  Intake 720 ml  Output 1700 ml  Net -980 ml   Filed Weights   12/18/20 0751 12/18/20 0755 12/19/20 2056  Weight: 82 kg 113.4 kg 80.5 kg   Body mass index is 27.8 kg/m.   Physical  Exam:  General exam: awake, alert, no acute distress, frail HEENT: moist mucus membranes, hearing grossly normal  Respiratory system: CTAB, no wheezes, rales or rhonchi, normal respiratory effort. Cardiovascular system: normal S1/S2, RRR.   Gastrointestinal system: soft, NT, ND, no HSM felt, +bowel sounds. Central nervous system: A&O x3. no gross focal neurologic deficits, normal speech Skin: dry, intact, normal temperature Psychiatry: normal mood, congruent affect, judgement and insight appear normal    Data Review: I have personally reviewed the following laboratory data and studies,  CBC: Recent Labs  Lab 12/19/20 0616 12/20/20 0231 12/22/20 0202  WBC 19.2* 14.4* 12.9*  NEUTROABS  --   --  10.7*  HGB 10.3* 9.2* 9.1*  HCT 33.3* 28.2* 28.0*  MCV 84.1 82.5 81.9  PLT 337 292 371   Basic Metabolic Panel: Recent Labs  Lab 12/20/20 0231 12/21/20 0241 12/22/20 0202 12/23/20 0509 12/24/20 0605 12/25/20 0906  NA 139  --  137 133* 136 135  K 3.8  --  3.4* 3.3* 3.5 3.3*  CL 103  --  91* 90* 94* 93*  CO2 27  --  33* 29 30 31   GLUCOSE 108*  --  151* 218* 165* 260*  BUN 49*  --  56* 84* 91* 99*  CREATININE 3.59* 4.28* 4.10* 4.86* 5.63* 6.12*  CALCIUM 8.2*  --  7.8* 7.8* 7.6* 7.3*  MG 1.8  --  2.1  --   --   --   PHOS 3.7  --  5.6*  --   --  6.4*   Liver Function Tests: Recent Labs  Lab 12/19/20 0616 12/25/20 0906  AST 36  --   ALT 17  --   ALKPHOS 93  --   BILITOT 1.0  --   PROT 7.3  --   ALBUMIN 2.9* 2.3*   No results for input(s): LIPASE, AMYLASE in the last 168 hours. No results for input(s): AMMONIA in the last 168 hours. Cardiac Enzymes: Recent Labs  Lab 12/20/20 0231  CKTOTAL 286*   BNP (last 3 results) No results for input(s): BNP in the last 8760 hours.  ProBNP (last 3 results) No results for input(s): PROBNP in the last 8760 hours.  CBG: Recent Labs  Lab 12/24/20 1544 12/24/20 2012 12/25/20 6967  12/25/20 1147 12/25/20 1646  GLUCAP 228*  314* 249* 292* 411*   Recent Results (from the past 240 hour(s))  Resp Panel by RT-PCR (Flu A&B, Covid) Nasopharyngeal Swab     Status: None   Collection Time: 12/18/20  5:19 PM   Specimen: Nasopharyngeal Swab; Nasopharyngeal(NP) swabs in vial transport medium  Result Value Ref Range Status   SARS Coronavirus 2 by RT PCR NEGATIVE NEGATIVE Final    Comment: (NOTE) SARS-CoV-2 target nucleic acids are NOT DETECTED.  The SARS-CoV-2 RNA is generally detectable in upper respiratory specimens during the acute phase of infection. The lowest concentration of SARS-CoV-2 viral copies this assay can detect is 138 copies/mL. A negative result does not preclude SARS-Cov-2 infection and should not be used as the sole basis for treatment or other patient management decisions. A negative result may occur with  improper specimen collection/handling, submission of specimen other than nasopharyngeal swab, presence of viral mutation(s) within the areas targeted by this assay, and inadequate number of viral copies(<138 copies/mL). A negative result must be combined with clinical observations, patient history, and epidemiological information. The expected result is Negative.  Fact Sheet for Patients:  EntrepreneurPulse.com.au  Fact Sheet for Healthcare Providers:  IncredibleEmployment.be  This test is no t yet approved or cleared by the [place] FDA and  has been authorized for detection and/or diagnosis of SARS-CoV-2 by FDA under an Emergency Use Authorization (EUA). This EUA will remain  in effect (meaning this test can be used) for the duration of the COVID-19 declaration under Section 564(b)(1) of the Act, 21 U.S.C.section 360bbb-3(b)(1), unless the authorization is terminated  or revoked sooner.       Influenza A by PCR NEGATIVE NEGATIVE Final   Influenza B by PCR NEGATIVE NEGATIVE Final    Comment: (NOTE) The Xpert Xpress SARS-CoV-2/FLU/RSV plus assay  is intended as an aid in the diagnosis of influenza from Nasopharyngeal swab specimens and should not be used as a sole basis for treatment. Nasal washings and aspirates are unacceptable for Xpert Xpress SARS-CoV-2/FLU/RSV testing.  Fact Sheet for Patients: EntrepreneurPulse.com.au  Fact Sheet for Healthcare Providers: IncredibleEmployment.be  This test is not yet approved or cleared by the [place] FDA and has been authorized for detection and/or diagnosis of SARS-CoV-2 by FDA under an Emergency Use Authorization (EUA). This EUA will remain in effect (meaning this test can be used) for the duration of the COVID-19 declaration under Section 564(b)(1) of the Act, 21 U.S.C. section 360bbb-3(b)(1), unless the authorization is terminated or revoked.  Performed at [facility], [address] [place], [place] [address]   Urine Culture     Status: Abnormal   Collection Time: 12/18/20  5:30 PM   Specimen: Urine, Clean Catch  Result Value Ref Range Status   Specimen Description   Final    URINE, CLEAN CATCH Performed at [facility], [address] [place], [place] [address]    Special Requests   Final    NONE Performed at [facility], [address], [place], [place] [address]    Culture (A)  Final    <10,000 COLONIES/mL INSIGNIFICANT GROWTH Performed at [facility] [place] [address], [place], [place] [address]    Report Status 12/19/2020 FINAL  Final  Blood culture (routine x 2)     Status: None (Preliminary result)   Collection Time: 12/18/20 10:30 PM   Specimen: BLOOD  Result Value Ref Range Status   Specimen Description BLOOD BLOOD RIGHT HAND  Final   Special Requests   Final    BOTTLES DRAWN AEROBIC AND ANAEROBIC Blood Culture adequate volume   Culture   Final    NO GROWTH 4 DAYS Performed at [facility], [address] [place], [place] [address]    Report Status PENDING   Incomplete  Blood culture (routine x 2)     Status: None (Preliminary result)   Collection Time: 12/18/20 10:35 PM   Specimen: BLOOD  Result Value Ref Range Status   Specimen Description BLOOD BLOOD LEFT HAND  Final   Special Requests   Final    BOTTLES DRAWN AEROBIC AND ANAEROBIC Blood Culture adequate volume   Culture   Final    NO GROWTH 4 DAYS Performed at [facility], [address] [place], [place] [address]    Report Status PENDING  Incomplete  VZV PCR, CSF     Status: None   Collection Time: 12/19/20 10:52 AM   Specimen: Cerebrospinal Fluid  Result Value Ref Range Status   VZV PCR, CSF Negative Negative Final    Comment: (NOTE) No Varicella Zoster Virus DNA detected. Performed At: [facility] [place], [place] [id number] Rush Farmer MD [id number]   CSF culture w Gram Stain     Status: None   Collection Time: 12/20/20  9:45 AM   Specimen: PATH Cytology CSF; Cerebrospinal Fluid  Result Value Ref Range Status   Specimen Description   Final    CSF Performed at [facility], [address], [place], [place] [address]    Special Requests   Final    NONE Performed at [facility], [address] [place], [place] [address]    Gram Stain   Final    WBC SEEN NO RBC SEEN NO ORGANISMS SEEN Performed at [facility], [address] [place], [place] [address]    Culture   Final    NO GROWTH 3 DAYS Performed at [facility], [place] [address] [place], [place] [address]    Report Status 12/23/2020 FINAL  Final     Studies: No results found.  Total time spent: 30 minutes with > 50% spent at bedside and in coordination of care   Ezekiel Slocumb, DO  [facility] 12/25/2020  If 7PM-7AM, please contact night-coverage

## 2020-12-26 LAB — CBC
HCT: 24.7 % — ABNORMAL LOW (ref 36.0–46.0)
Hemoglobin: 8 g/dL — ABNORMAL LOW (ref 12.0–15.0)
MCH: 26.2 pg (ref 26.0–34.0)
MCHC: 32.4 g/dL (ref 30.0–36.0)
MCV: 81 fL (ref 80.0–100.0)
Platelets: 424 10*3/uL — ABNORMAL HIGH (ref 150–400)
RBC: 3.05 MIL/uL — ABNORMAL LOW (ref 3.87–5.11)
RDW: 17.1 % — ABNORMAL HIGH (ref 11.5–15.5)
WBC: 17 10*3/uL — ABNORMAL HIGH (ref 4.0–10.5)
nRBC: 0 % (ref 0.0–0.2)

## 2020-12-26 LAB — GLUCOSE, CAPILLARY
Glucose-Capillary: 179 mg/dL — ABNORMAL HIGH (ref 70–99)
Glucose-Capillary: 290 mg/dL — ABNORMAL HIGH (ref 70–99)
Glucose-Capillary: 390 mg/dL — ABNORMAL HIGH (ref 70–99)
Glucose-Capillary: 335 mg/dL — ABNORMAL HIGH (ref 70–99)

## 2020-12-26 LAB — BASIC METABOLIC PANEL
Anion gap: 11 (ref 5–15)
BUN: 90 mg/dL — ABNORMAL HIGH (ref 8–23)
CO2: 29 mmol/L (ref 22–32)
Calcium: 7.3 mg/dL — ABNORMAL LOW (ref 8.9–10.3)
Chloride: 94 mmol/L — ABNORMAL LOW (ref 98–111)
Creatinine, Ser: 6.21 mg/dL — ABNORMAL HIGH (ref 0.44–1.00)
GFR, Estimated: 6 mL/min — ABNORMAL LOW (ref >60)
Glucose, Bld: 196 mg/dL — ABNORMAL HIGH (ref 70–99)
Potassium: 3.7 mmol/L (ref 3.5–5.1)
Sodium: 134 mmol/L — ABNORMAL LOW (ref 135–145)

## 2020-12-26 MED ORDER — PREDNISONE 20 MG PO TABS
10.0000 mg | ORAL_TABLET | Freq: Every day | ORAL | Status: DC
  Administered 2020-12-27: 10:00:00 10 mg via ORAL
  Filled 2020-12-26: qty 1

## 2020-12-26 NOTE — Care Management Important Message (Signed)
Important Message  Patient Details  Name: Jennifer Carrillo MRN: [id number] Date of Birth: 06-Aug-1933   Medicare Important Message Given:  Yes     Dannette Barbara 12/26/2020, 11:41 AM

## 2020-12-26 NOTE — Progress Notes (Signed)
[facility]  ROUNDING NOTE   Subjective:   Jennifer Carrillo is a 87 y.o. female with past medical concerns including hypertension, diabetes, CAD, shingles, breast CA, and chronic kidney disease stage 4. She presented to the ed with pain extending from her back to her legs and was not able to get out of bed. Patient was admitted for Bilateral leg pain [M79.604, M79.605] Sepsis ([place]) [A41.9] Acute bilateral low back pain, unspecified whether sciatica present [M54.50] Sepsis without acute organ dysfunction, due to unspecified organism [facility]) [A41.9]  Patient is currently known to our office and is followed by Dr Candiss Norse. She was last seen in the office on 12/12/20.   Update:  Patient sitting up in bed Alert but disoriented Patient seen later sitting up in chair Could not remember if she has spoken with husband about PD catheter placement.  Creatinine 6.21 Urine output 1L overnight  Vital signs in last 24 hours:  Temp:  [97.7 F (36.5 C)-98.6 F (37 C)] 97.7 F (36.5 C) (12/19 0905) Pulse Rate:  [65-69] 66 (12/19 0905) Resp:  [18-20] 18 (12/19 0905) BP: (161-188)/(48-55) 161/48 (12/19 0905) SpO2:  [96 %-100 %] 100 % (12/19 0905)  Weight change:  Filed Weights   12/18/20 0751 12/18/20 0755 12/19/20 2056  Weight: 82 kg 113.4 kg 80.5 kg    Intake/Output: I/O last 3 completed shifts: In: 720 [P.O.:720] Out: 2100 [Urine:2100]   Intake/Output this shift:  No intake/output data recorded.  Physical Exam: General: NAD, sitting up in bed  Head: Normocephalic, atraumatic. Moist oral mucosal membranes  Eyes: Anicteric  Lungs:  Clear to auscultation, normal effort  Heart: Regular rate and rhythm  Abdomen:  Soft, nontender, nondistended  Extremities:  no peripheral edema.  Neurologic: Nonfocal, moving all four extremities  Skin: No lesions       Basic Metabolic Panel: Recent Labs  Lab 12/20/20 0231 12/21/20 0241 12/22/20 0202 12/23/20 0509 12/24/20 0605  12/25/20 0906 12/26/20 0309  NA 139  --  137 133* 136 135 134*  K 3.8  --  3.4* 3.3* 3.5 3.3* 3.7  CL 103  --  91* 90* 94* 93* 94*  CO2 27  --  33* 29 30 31 29   GLUCOSE 108*  --  151* 218* 165* 260* 196*  BUN 49*  --  56* 84* 91* 99* 90*  CREATININE 3.59*   < > 4.10* 4.86* 5.63* 6.12* 6.21*  CALCIUM 8.2*  --  7.8* 7.8* 7.6* 7.3* 7.3*  MG 1.8  --  2.1  --   --   --   --   PHOS 3.7  --  5.6*  --   --  6.4*  --    < > = values in this interval not displayed.     Liver Function Tests: Recent Labs  Lab 12/25/20 0906  ALBUMIN 2.3*    No results for input(s): LIPASE, AMYLASE in the last 168 hours. No results for input(s): AMMONIA in the last 168 hours.  CBC: Recent Labs  Lab 12/20/20 0231 12/22/20 0202 12/26/20 0309  WBC 14.4* 12.9* 17.0*  NEUTROABS  --  10.7*  --   HGB 9.2* 9.1* 8.0*  HCT 28.2* 28.0* 24.7*  MCV 82.5 81.9 81.0  PLT 292 347 424*     Cardiac Enzymes: Recent Labs  Lab 12/20/20 0231  CKTOTAL 286*     BNP: Invalid input(s): POCBNP  CBG: Recent Labs  Lab 12/25/20 0752 12/25/20 1147 12/25/20 1646 12/25/20 2115 12/26/20 0759  GLUCAP 249*  292* 411* 317* 179*     Microbiology: Results for orders placed or performed during the hospital encounter of 12/18/20  Resp Panel by RT-PCR (Flu A&B, Covid) Nasopharyngeal Swab     Status: None   Collection Time: 12/18/20  5:19 PM   Specimen: Nasopharyngeal Swab; Nasopharyngeal(NP) swabs in vial transport medium  Result Value Ref Range Status   SARS Coronavirus 2 by RT PCR NEGATIVE NEGATIVE Final    Comment: (NOTE) SARS-CoV-2 target nucleic acids are NOT DETECTED.  The SARS-CoV-2 RNA is generally detectable in upper respiratory specimens during the acute phase of infection. The lowest concentration of SARS-CoV-2 viral copies this assay can detect is 138 copies/mL. A negative result does not preclude SARS-Cov-2 infection and should not be used as the sole basis for treatment or other patient management  decisions. A negative result may occur with  improper specimen collection/handling, submission of specimen other than nasopharyngeal swab, presence of viral mutation(s) within the areas targeted by this assay, and inadequate number of viral copies(<138 copies/mL). A negative result must be combined with clinical observations, patient history, and epidemiological information. The expected result is Negative.  Fact Sheet for Patients:  EntrepreneurPulse.com.au  Fact Sheet for Healthcare Providers:  IncredibleEmployment.be  This test is no t yet approved or cleared by the [place] FDA and  has been authorized for detection and/or diagnosis of SARS-CoV-2 by FDA under an Emergency Use Authorization (EUA). This EUA will remain  in effect (meaning this test can be used) for the duration of the COVID-19 declaration under Section 564(b)(1) of the Act, 21 U.S.C.section 360bbb-3(b)(1), unless the authorization is terminated  or revoked sooner.       Influenza A by PCR NEGATIVE NEGATIVE Final   Influenza B by PCR NEGATIVE NEGATIVE Final    Comment: (NOTE) The Xpert Xpress SARS-CoV-2/FLU/RSV plus assay is intended as an aid in the diagnosis of influenza from Nasopharyngeal swab specimens and should not be used as a sole basis for treatment. Nasal washings and aspirates are unacceptable for Xpert Xpress SARS-CoV-2/FLU/RSV testing.  Fact Sheet for Patients: EntrepreneurPulse.com.au  Fact Sheet for Healthcare Providers: IncredibleEmployment.be  This test is not yet approved or cleared by the [place] FDA and has been authorized for detection and/or diagnosis of SARS-CoV-2 by FDA under an Emergency Use Authorization (EUA). This EUA will remain in effect (meaning this test can be used) for the duration of the COVID-19 declaration under Section 564(b)(1) of the Act, 21 U.S.C. section 360bbb-3(b)(1), unless the  authorization is terminated or revoked.  Performed at [facility], [address], [place], [place] [address]   Urine Culture     Status: Abnormal   Collection Time: 12/18/20  5:30 PM   Specimen: Urine, Clean Catch  Result Value Ref Range Status   Specimen Description   Final    URINE, CLEAN CATCH Performed at [facility], [address] [place], [place] [address]    Special Requests   Final    NONE Performed at [facility], [address] [place], [place] [address]    Culture (A)  Final    <10,000 COLONIES/mL INSIGNIFICANT GROWTH Performed at [facility] [address] [place], [place] [address]    Report Status 12/19/2020 FINAL  Final  Blood culture (routine x 2)     Status: None (Preliminary result)   Collection Time: 12/18/20 10:30 PM   Specimen: BLOOD  Result Value Ref Range Status   Specimen Description BLOOD BLOOD RIGHT HAND  Final   Special Requests   Final    BOTTLES DRAWN AEROBIC AND ANAEROBIC Blood Culture adequate volume   Culture   Final    NO GROWTH 4 DAYS Performed at [facility], [address] [place], [place] [address]    Report Status PENDING  Incomplete  Blood culture (routine x 2)     Status: None (Preliminary result)   Collection Time: 12/18/20 10:35 PM   Specimen: BLOOD  Result Value Ref Range Status   Specimen Description BLOOD BLOOD LEFT HAND  Final   Special Requests   Final    BOTTLES DRAWN AEROBIC AND ANAEROBIC Blood Culture adequate volume   Culture   Final    NO GROWTH 4 DAYS Performed at [facility], [address] [place], [place] [address]    Report Status PENDING  Incomplete  VZV PCR, CSF     Status: None   Collection Time: 12/19/20 10:52 AM   Specimen: Cerebrospinal Fluid  Result Value Ref Range Status   VZV PCR, CSF Negative Negative Final    Comment: (NOTE) No Varicella Zoster Virus DNA detected. Performed At: [facility],  [place] [id number] Rush Farmer MD XH:[id number]   CSF culture w Gram Stain     Status: None   Collection Time: 12/20/20  9:45 AM   Specimen: PATH Cytology CSF; Cerebrospinal Fluid  Result Value Ref Range Status   Specimen Description   Final    CSF Performed at [facility], [address] [place], [place] [address]    Special Requests   Final    NONE Performed at [facility], [address] [place], [place] [address]    Gram Stain   Final    WBC SEEN NO RBC SEEN NO ORGANISMS SEEN Performed at [facility], [address], [place], [place] [address]    Culture   Final    NO GROWTH 3 DAYS Performed at [facility], [place] [address], [place], [place] [address]    Report Status 12/23/2020 FINAL  Final    Coagulation Studies: No results for input(s): LABPROT, INR in the last 72 hours.  Urinalysis: No results for input(s): COLORURINE, LABSPEC, PHURINE, GLUCOSEU, HGBUR, BILIRUBINUR, KETONESUR, PROTEINUR, UROBILINOGEN, NITRITE, LEUKOCYTESUR in the last 72 hours.  Invalid input(s): APPERANCEUR    Imaging: No results found.   Medications:     amLODipine  10 mg Oral Daily   anastrozole  1 mg Oral QPC supper   atorvastatin  40 mg Oral Daily   clopidogrel  75 mg Oral Daily   digoxin  0.0625 mg Oral Q48H   docusate sodium  100 mg Oral BID   donepezil  10 mg Oral QHS   heparin injection (subcutaneous)  5,000 Units Subcutaneous Q8H   hydrALAZINE  100 mg Oral Q8H   insulin aspart  0-5 Units Subcutaneous QHS   insulin aspart  0-9 Units Subcutaneous TID WC   insulin glargine-yfgn  8 Units Subcutaneous QHS   isosorbide mononitrate  60 mg Oral Daily   mometasone-formoterol  2 puff Inhalation BID   [START ON 12/27/2020] predniSONE  10 mg Oral Q breakfast   senna  1 tablet Oral Daily   acetaminophen **OR** acetaminophen, docusate sodium, hydrALAZINE, labetalol, ondansetron **OR** ondansetron (ZOFRAN) IV, traMADol  Assessment/ Plan:  Ms.  Jennifer Carrillo is a 86 y.o.  female with past medical concerns including hypertension, diabetes, CAD, shingles, breast CA, and chronic kidney disease stage 4.  She presented to the ed with pain extending from her back to her legs and was not able to get out of bed. Patient was admitted for Bilateral leg pain [M79.604, M79.605] Sepsis ([place]) [A41.9] Acute bilateral low back pain, unspecified whether sciatica present [M54.50] Sepsis without acute organ dysfunction, due to unspecified organism ([place]) [A41.9]   End stage renal disease requiring dialysis.   Due to progression of disease and steady decline of renal function without sustainable recovery. We feel this has progressed to end stage renal disease.   -Creatinine 6.2 today with more than 1L UOP overnight.   -We were able to call patient's daughter to discuss treatment plan. Attempted to contact husband, he is believed to be enroute to hospital. Peritoneal dialysis was explained to daughter and education has been scheduled for tomorrow. They are agreeable with proceeding with PD placement.  -Contacted surgery and they are able to place PD catheter on Friday due to Plavix.   Lab Results  Component Value Date   CREATININE 6.21 (H) 12/26/2020   CREATININE 6.12 (H) 12/25/2020   CREATININE 5.63 (H) 12/24/2020    Intake/Output Summary (Last 24 hours) at 12/26/2020 1013 Last data filed at 12/26/2020 0130 Gross per 24 hour  Intake 480 ml  Output 1000 ml  Net -520 ml    2. Anemia of chronic kidney disease Lab Results  Component Value Date   HGB 8.0 (L) 12/26/2020    Hgb not at target, will consider EPO  3. Secondary Hyperparathyroidism: with outpatient labs:phosphorus 3.5, calcium 9.3 on 11/07/20.   Lab Results  Component Value Date   PTH 35 06/27/2018   CALCIUM 7.3 (L) 12/26/2020   PHOS 6.4 (H) 12/25/2020    Calcium and phosphorus remain out of target. Will continue to monitor   4. Diabetes mellitus type II with chronic kidney  disease insulin dependent. Home regimen includes NPH. Most recent hemoglobin A1c is 5.3 on 12/18/20. Primary team to manage SSI. Glucose elevated this admission   5.  Hypertension with chronic kidney disease.  Home regimen includes hydrochlorothiazide, isosorbide, telmisartan, amlodipine, and hydralazine.  Telmisartan and HCTZ currently held.  - BP 161/48   LOS: 8   12/19/202210:13 AM

## 2020-12-26 NOTE — Progress Notes (Signed)
Physical Therapy Treatment Patient Details Name: Jennifer Carrillo MRN: [id number] DOB: [birth year] Today's Date: 12/26/2020   History of Present Illness Jennifer Carrillo is an 29yoF who comes to [facility] on 12/11 from home with weakness adn back/leg pain. Pt admitted with sepsis of unknown source. PMH: DM, HTN, CKD4, CAD, Shingles on 12/05/20 s/p valtrex, CA breast on anastrozole, hyperlipidemia, OSA, discoid lupus. Etiology appears non-infectious per ID, now consulting rheumatology.    PT Comments    Pt seen this pm, received in chair with Husband at side. Pt required ModA to stand from recliner, short distance gait to bed with support of RW and MinA. Pt with improved cognitive status and no c/o pain this date. Appears to be improving functionally and strength wise. Anticipate short term sub-acute rehab stay once medically stable.    Recommendations for follow up therapy are one component of a multi-disciplinary discharge planning process, led by the attending physician.  Recommendations may be updated based on patient status, additional functional criteria and insurance authorization.  Follow Up Recommendations  Skilled nursing-short term rehab (<3 hours/day)     Assistance Recommended at Discharge Set up [place] Recommendations  None recommended by PT    Recommendations for Other Services       Precautions / Restrictions Precautions Precautions: Fall Precaution Comments:  (clearer cognition today)     Mobility  Bed Mobility Overal bed mobility: Needs Assistance Bed Mobility: Sit to Supine Rolling: Min assist;Min guard (with side rail)   Supine to sit: Min assist;HOB elevated Sit to supine: Min guard;Min assist   General bed mobility comments:  (Pt's functional mobility improving)    Transfers Overall transfer level: Needs assistance Equipment used: Rolling walker (2 wheels) Transfers: Sit to/from Stand Sit to Stand: Mod assist            General transfer comment:  ([place] to gain upright posture and balance at RW)    Ambulation/Gait Ambulation/Gait assistance: Min assist Gait Distance (Feet): 4 Feet Assistive device: Rolling walker (2 wheels) Gait Pattern/deviations: Step-to pattern       General Gait Details:  (R genu valgum with increased knee flexion noted)   Stairs             Wheelchair Mobility    Modified Rankin (Stroke Patients Only)       Balance Overall balance assessment: Needs assistance Sitting-balance support: Bilateral upper extremity supported;Feet supported Sitting balance-Leahy Scale: Fair Sitting balance - Comments: no LOB EOB   Standing balance support: Reliant on assistive device for balance;During functional activity Standing balance-Leahy Scale: Poor                              Cognition Arousal/Alertness: Awake/alert Behavior During Therapy: WFL for tasks assessed/performed Overall Cognitive Status: History of cognitive impairments - at baseline                                 General Comments:  (Pt pleasant and cooperative, able to follow simple commands)        Exercises General Exercises - Lower Extremity Ankle Circles/Pumps: AROM;Both;10 reps Heel Slides: AROM;Both;5 reps Hip ABduction/ADduction: AROM;Both;5 reps Other Exercises Other Exercises: Pt and husband educated on POC and current PT goals with plan to transition to SNF once medically stable.    General Comments General comments (skin integrity, edema, etc.):  (Pt vey pleasant and motivated)  Pertinent Vitals/Pain Pain Assessment: No/denies pain Faces Pain Scale: Hurts little more Pain Location: R knee Pain Descriptors / Indicators: Aching;Discomfort Pain Intervention(s): Limited activity within patient's tolerance;Monitored during session;Repositioned    Home Living                          Prior Function            PT Goals (current goals can now  be found in the care plan section) Acute Rehab PT Goals Patient Stated Goal: less pain, more strong Progress towards PT goals: Progressing toward goals    Frequency    Min 2X/week      PT Plan Current plan remains appropriate    Co-evaluation              AM-PAC PT "6 Clicks" Mobility   Outcome Measure  Help needed turning from your back to your side while in a flat bed without using bedrails?: A Little Help needed moving from lying on your back to sitting on the side of a flat bed without using bedrails?: A Little Help needed moving to and from a bed to a chair (including a wheelchair)?: A Little Help needed standing up from a chair using your arms (e.g., wheelchair or bedside chair)?: A Lot Help needed to walk in hospital room?: A Little Help needed climbing 3-5 steps with a railing? : Total 6 Click Score: 15    End of Session Equipment Utilized During Treatment: Gait belt Activity Tolerance: Patient tolerated treatment well;No increased pain Patient left: in bed;with call bell/phone within reach;with bed alarm set;with family/visitor present Nurse Communication: Mobility status PT Visit Diagnosis: Unsteadiness on feet (R26.81);Difficulty in walking, not elsewhere classified (R26.2);Other abnormalities of gait and mobility (R26.89);Muscle weakness (generalized) (M62.81)     Time: 0092-3300 PT Time Calculation (min) (ACUTE ONLY): 29 min  Charges:  $Therapeutic Exercise: 8-22 mins $Therapeutic Activity: 8-22 mins           Mikel Cella, PTA   Josie Dixon 12/26/2020, 3:07 PM

## 2020-12-26 NOTE — TOC Progression Note (Signed)
Transition of Care [facility]) - Progression Note    Patient Details  Name: Jennifer Carrillo MRN: [id number] Date of Birth: 08-03-1933  Transition [facility] CM/SW Contact  Beverly Sessions, RN Phone Number: 12/26/2020, 3:47 PM  Clinical Narrative:     Presented bed offers to [facility].  At this this time they no longer wish to pursue  SNF placement, and wish for patient to return home with home health services.  She requests [facility].  Corene Cornea with [facility] to review.   Plan for PD cath placement Friday    Expected Discharge Plan: [place] Barriers to Discharge: Continued Medical Work up  Expected Discharge Plan and Services Expected Discharge Plan: [place]   Discharge Planning Services: CM Consult Post Acute Care Choice: [place] arrangements for the past 2 months: Single Family Home                 DME Arranged: N/A DME Agency: NA                   Social Determinants of Health (SDOH) Interventions    Readmission Risk Interventions Readmission Risk Prevention Plan 12/19/2020  Transportation Screening Complete  Medication Review Press photographer) Complete  PCP or Specialist appointment within 3-5 days of discharge Complete  HRI or Home Care Consult Complete  SW Recovery Care/Counseling Consult Complete  Palliative Care Screening Not [place] Complete  Some recent data might be hidden

## 2020-12-26 NOTE — Progress Notes (Signed)
Inpatient Diabetes Program Recommendations  AACE/ADA: New Consensus Statement on Inpatient Glycemic Control   Target Ranges:  Prepandial:   less than 140 mg/dL      Peak postprandial:   less than 180 mg/dL (1-2 hours)      Critically ill patients:  140 - 180 mg/dL     Latest Reference Range & Units 12/25/20 07:52 12/25/20 11:47 12/25/20 16:46 12/25/20 21:15 12/26/20 07:59  Glucose-Capillary 70 - 99 mg/dL 249 (H) 292 (H) 411 (H) 317 (H) 179 (H)   Review of Glycemic Control  Diabetes history: DM2 Outpatient Diabetes medications: 70/30 20 units BID Current orders for Inpatient glycemic control: Semglee 8 units QHS, Novolog 0-9 units TID with meals, Novolog 0-5 units QHS; Prednisone 20 mg QAM  Inpatient Diabetes Program Recommendations:    Insulin: If steroids are continued, please consider ordering Novolog 6 units TID with meals for meal coverage if patient eats at least 50% of meals.  Thanks, Barnie Alderman, RN, MSN, CDE Diabetes Coordinator Inpatient Diabetes Program [phone] (Team Pager from 8am to 5pm)

## 2020-12-26 NOTE — Progress Notes (Signed)
PROGRESS NOTE  ALBERT DEVAUL LGX:[id number] DOB: 1934-01-08 DOA: 12/18/2020 PCP: [name], MD   LOS: 8 days   Brief narrative: 87 y.o. female with past medical history of diabetes mellitus type 2, hypertension, CKD stage IV, coronary artery disease, history of shingles diagnosed on 11/28 treated with Valtrex and gabapentin presented to hospital with 1 day history of diffuse pain extending from her lower back to her legs to the level where she was unable to get out of the bed.  In the emergency room, she was febrile.  Blood pressures were elevated.  Creatinine 4.1.  Baseline creatinine about 3.8-4.  Admitted with broad-spectrum antibiotics.  Remains in the hospital waiting for SNF bed as well as worsening renal functions.    Assessment/Plan:  Principal Problem:   Sepsis ([place]) Active Problems:   PAD (peripheral artery disease) ([place])   Coronary artery disease involving native coronary artery of native heart   Anemia of chronic kidney failure, stage 4 (severe) (H[facility])   Acute kidney injury superimposed on CKD IV (H[facility])   Metabolic acidosis   Rhabdomyolysis   Hypertensive emergency without congestive heart failure   Hyperglycemia due to type 2 diabetes mellitus ([place])   Shingles 12/05/2020    Severe sepsis present on admission with altered mental status and acute kidney injury: Likely because polyarthritis.   Presented with fever, tachycardia and leukocytosis.  Recent history of treated shingles.  Procalcitonin normal.  Lactate normal.   Lumbar puncture without evidence of meningitis, antibiotics discontinued  MRI lumbosacral spine negative for infection.  Urinalysis was unremarkable.  Blood cultures negative so far.   Polyarthritis, seen by rheumatology.     Bilateral knee aspirate with pseudogout, negative cultures.  Steroid injections.   Started patient on low-dose prednisone and taper over 1 week.  Taper off today. Bilateral x-ray with extensive osteoarthritis.   Use low-dose  tramadol for pain relief.  Altered mental status likely metabolic infective encephalopathy. Adequately improved.  Mental status improved.   Acute kidney injury superimposed on CKD IV with metabolic acidosis Progressive azotemia.  Renal ultrasound with no evidence of obstruction. Creatinine continues to rise. Urine output more than 1 L last 24 hours. Followed by nephrology.  Nephrology to decide.  Mild cognitive dysfunction with metabolic encephalopathy. At baseline.  Continue delirium precautions.  Resumed Aricept.   Rhabdomyolysis, nontraumatic Improved.   Hypertensive urgency.  Stable.  Hyperglycemia due to type 2 diabetes mellitus  Stable today.  Will decrease prednisone to avoid hypoglycemia.   History of shingles 12/05/2020 Completed Valtrex.   PAD (peripheral artery disease Stable.   Coronary artery disease involving native coronary artery of native heart Patient on digoxin, Ranexa, statin and Plavix. Digoxin level therapeutic, continue with renal dosing. With worsening renal functions, Ranexa is contraindicated.  Discontinue.  History of breast cancer Continue Arimidex from home.  Generalized weakness at home.  Eval by PT OT.  Recommended SNF.  Stable for transfer if cleared by nephrology.  DVT prophylaxis: heparin injection 5,000 Units Start: 12/22/20 1600 SCDs Start: 12/18/20 2238   Code Status: Full code  Family Communication:  None today.  Status is: Inpatient  Remains inpatient appropriate because: Worsening renal functions.  Unsafe discharge plan.  Medically stable to transfer to skilled level of care.  Consultants: Infectious disease Nephrology Rheumatology  Procedures: Lumbar puncture Bilateral knee aspirate and steroid injections  Anti-infectives:  Vancomycin,ampicillin and acyclovir 12/11-12/13 Rocephin 12/11--- 12/14  Anti-infectives (From admission, onward)    Start     Dose/Rate Route Frequency  Ordered Stop   12/21/20 1200   cefTRIAXone (ROCEPHIN) 2 g in sodium chloride 0.9 % 100 mL IVPB  Status:  Discontinued        2 g 200 mL/hr over 30 Minutes Intravenous Every 24 hours 12/20/20 1805 12/21/20 1901   12/21/20 0200  vancomycin (VANCOREADY) IVPB 750 mg/150 mL  Status:  Discontinued        750 mg 150 mL/hr over 60 Minutes Intravenous Every 48 hours 12/18/20 2312 12/20/20 1416   12/20/20 2200  vancomycin (VANCOREADY) IVPB 750 mg/150 mL  Status:  Discontinued        750 mg 150 mL/hr over 60 Minutes Intravenous Every 48 hours 12/20/20 1416 12/20/20 1805   12/19/20 2200  ceFEPIme (MAXIPIME) 2 g in sodium chloride 0.9 % 100 mL IVPB  Status:  Discontinued        2 g 200 mL/hr over 30 Minutes Intravenous Every 24 hours 12/18/20 2309 12/19/20 1114   12/19/20 2200  cefTRIAXone (ROCEPHIN) 2 g in sodium chloride 0.9 % 100 mL IVPB  Status:  Discontinued        2 g 200 mL/hr over 30 Minutes Intravenous Every 12 hours 12/19/20 1114 12/20/20 1805   12/19/20 1130  ampicillin (OMNIPEN) 2 g in sodium chloride 0.9 % 100 mL IVPB  Status:  Discontinued        2 g 300 mL/hr over 20 Minutes Intravenous Every 12 hours 12/19/20 1116 12/20/20 1805   12/19/20 0200  vancomycin (VANCOCIN) IVPB 1000 mg/200 mL premix       See Hyperspace for full Linked Orders Report.   1,000 mg 200 mL/hr over 60 Minutes Intravenous  Once 12/19/20 0042 12/19/20 0204   12/19/20 0200  vancomycin (VANCOREADY) IVPB 1500 mg/300 mL       See Hyperspace for full Linked Orders Report.   1,500 mg 150 mL/hr over 120 Minutes Intravenous  Once 12/19/20 0042 12/19/20 0453   12/19/20 0130  acyclovir (ZOVIRAX) 825 mg in dextrose 5 % 150 mL IVPB  Status:  Discontinued        10 mg/kg  82.3 kg (Adjusted) 166.5 mL/hr over 60 Minutes Intravenous Every 24 hours 12/19/20 0044 12/20/20 1825   12/18/20 2330  acyclovir (ZOVIRAX) 825 mg in dextrose 5 % 150 mL IVPB  Status:  Discontinued        10 mg/kg  82.3 kg (Adjusted) 166.5 mL/hr over 60 Minutes Intravenous Every 24 hours  12/18/20 2318 12/19/20 0044   12/18/20 2300  metroNIDAZOLE (FLAGYL) IVPB 500 mg  Status:  Discontinued        500 mg 100 mL/hr over 60 Minutes Intravenous Every 12 hours 12/18/20 2251 12/19/20 1115   12/18/20 2230  vancomycin (VANCOREADY) IVPB 1500 mg/300 mL  Status:  Discontinued       See Hyperspace for full Linked Orders Report.   1,500 mg 150 mL/hr over 120 Minutes Intravenous  Once 12/18/20 2123 12/19/20 0042   12/18/20 2130  ceFEPIme (MAXIPIME) 2 g in sodium chloride 0.9 % 100 mL IVPB        2 g 200 mL/hr over 30 Minutes Intravenous  Once 12/18/20 2117 12/19/20 0000   12/18/20 2130  metroNIDAZOLE (FLAGYL) IVPB 500 mg  Status:  Discontinued        500 mg 100 mL/hr over 60 Minutes Intravenous  Once 12/18/20 2117 12/18/20 2342   12/18/20 2130  vancomycin (VANCOCIN) IVPB 1000 mg/200 mL premix  Status:  Discontinued        1,000  mg 200 mL/hr over 60 Minutes Intravenous  Once 12/18/20 2117 12/18/20 2123   12/18/20 2130  vancomycin (VANCOCIN) IVPB 1000 mg/200 mL premix  Status:  Discontinued       See Hyperspace for full Linked Orders Report.   1,000 mg 200 mL/hr over 60 Minutes Intravenous  Once 12/18/20 2123 12/19/20 0042       Subjective: Seen and examined.  Denies any difficulties.  Ready to work with rehab people.  Mild pain persist but unlike in the past.  Objective: Vitals:   12/26/20 0617 12/26/20 0905  BP: (!) 188/55 (!) 161/48  Pulse: 65 66  Resp: 20 18  Temp: 98.6 F (37 C) 97.7 F (36.5 C)  SpO2: 97% 100%    Intake/Output Summary (Last 24 hours) at 12/26/2020 1221 Last data filed at 12/26/2020 0130 Gross per 24 hour  Intake 480 ml  Output 1000 ml  Net -520 ml   Filed Weights   12/18/20 0751 12/18/20 0755 12/19/20 2056  Weight: 82 kg 113.4 kg 80.5 kg   Body mass index is 27.8 kg/m.   Physical Exam:  General: Looks fairly comfortable.  On room air. Cardiovascular: S1-S2 normal.  Regular rate rhythm. Respiratory: Bilateral clear.  No added  sounds. Gastrointestinal: Soft.  Nontender.  Obese and pendulous. Ext: No swelling or edema.  She has generalized tenderness with no swelling or erythema of the both knees. Neuro: Alert and oriented.  Generalized weakness but no focal deficits.   Data Review: I have personally reviewed the following laboratory data and studies,  CBC: Recent Labs  Lab 12/20/20 0231 12/22/20 0202 12/26/20 0309  WBC 14.4* 12.9* 17.0*  NEUTROABS  --  10.7*  --   HGB 9.2* 9.1* 8.0*  HCT 28.2* 28.0* 24.7*  MCV 82.5 81.9 81.0  PLT 292 347 466*   Basic Metabolic Panel: Recent Labs  Lab 12/20/20 0231 12/21/20 0241 12/22/20 0202 12/23/20 0509 12/24/20 0605 12/25/20 0906 12/26/20 0309  NA 139  --  137 133* 136 135 134*  K 3.8  --  3.4* 3.3* 3.5 3.3* 3.7  CL 103  --  91* 90* 94* 93* 94*  CO2 27  --  33* 29 30 31 29   GLUCOSE 108*  --  151* 218* 165* 260* 196*  BUN 49*  --  56* 84* 91* 99* 90*  CREATININE 3.59*   < > 4.10* 4.86* 5.63* 6.12* 6.21*  CALCIUM 8.2*  --  7.8* 7.8* 7.6* 7.3* 7.3*  MG 1.8  --  2.1  --   --   --   --   PHOS 3.7  --  5.6*  --   --  6.4*  --    < > = values in this interval not displayed.   Liver Function Tests: Recent Labs  Lab 12/25/20 0906  ALBUMIN 2.3*   No results for input(s): LIPASE, AMYLASE in the last 168 hours. No results for input(s): AMMONIA in the last 168 hours. Cardiac Enzymes: Recent Labs  Lab 12/20/20 0231  CKTOTAL 286*   BNP (last 3 results) No results for input(s): BNP in the last 8760 hours.  ProBNP (last 3 results) No results for input(s): PROBNP in the last 8760 hours.  CBG: Recent Labs  Lab 12/25/20 0752 12/25/20 1147 12/25/20 1646 12/25/20 2115 12/26/20 0759  GLUCAP 249* 292* 411* 317* 179*   Recent Results (from the past 240 hour(s))  Resp Panel by RT-PCR (Flu A&B, Covid) Nasopharyngeal Swab     Status: None  Collection Time: 12/18/20  5:19 PM   Specimen: Nasopharyngeal Swab; Nasopharyngeal(NP) swabs in vial transport  medium  Result Value Ref Range Status   SARS Coronavirus 2 by RT PCR NEGATIVE NEGATIVE Final    Comment: (NOTE) SARS-CoV-2 target nucleic acids are NOT DETECTED.  The SARS-CoV-2 RNA is generally detectable in upper respiratory specimens during the acute phase of infection. The lowest concentration of SARS-CoV-2 viral copies this assay can detect is 138 copies/mL. A negative result does not preclude SARS-Cov-2 infection and should not be used as the sole basis for treatment or other patient management decisions. A negative result may occur with  improper specimen collection/handling, submission of specimen other than nasopharyngeal swab, presence of viral mutation(s) within the areas targeted by this assay, and inadequate number of viral copies(<138 copies/mL). A negative result must be combined with clinical observations, patient history, and epidemiological information. The expected result is Negative.  Fact Sheet for Patients:  EntrepreneurPulse.com.au  Fact Sheet for Healthcare Providers:  IncredibleEmployment.be  This test is no t yet approved or cleared by the [place] FDA and  has been authorized for detection and/or diagnosis of SARS-CoV-2 by FDA under an Emergency Use Authorization (EUA). This EUA will remain  in effect (meaning this test can be used) for the duration of the COVID-19 declaration under Section 564(b)(1) of the Act, 21 U.S.C.section 360bbb-3(b)(1), unless the authorization is terminated  or revoked sooner.       Influenza A by PCR NEGATIVE NEGATIVE Final   Influenza B by PCR NEGATIVE NEGATIVE Final    Comment: (NOTE) The Xpert Xpress SARS-CoV-2/FLU/RSV plus assay is intended as an aid in the diagnosis of influenza from Nasopharyngeal swab specimens and should not be used as a sole basis for treatment. Nasal washings and aspirates are unacceptable for Xpert Xpress SARS-CoV-2/FLU/RSV testing.  Fact Sheet for  Patients: EntrepreneurPulse.com.au  Fact Sheet for Healthcare Providers: IncredibleEmployment.be  This test is not yet approved or cleared by the [place] FDA and has been authorized for detection and/or diagnosis of SARS-CoV-2 by FDA under an Emergency Use Authorization (EUA). This EUA will remain in effect (meaning this test can be used) for the duration of the COVID-19 declaration under Section 564(b)(1) of the Act, 21 U.S.C. section 360bbb-3(b)(1), unless the authorization is terminated or revoked.  Performed at [facility], [address] [place], [place] [address]   Urine Culture     Status: Abnormal   Collection Time: 12/18/20  5:30 PM   Specimen: Urine, Clean Catch  Result Value Ref Range Status   Specimen Description   Final    URINE, CLEAN CATCH Performed at [facility], [address] [place], [place] [address]    Special Requests   Final    NONE Performed at [facility], [address] [place], [place] [address]    Culture (A)  Final    <10,000 COLONIES/mL INSIGNIFICANT GROWTH Performed at [facility] [address], [place], [place] [address]    Report Status 12/19/2020 FINAL  Final  Blood culture (routine x 2)     Status: None (Preliminary result)   Collection Time: 12/18/20 10:30 PM   Specimen: BLOOD  Result Value Ref Range Status   Specimen Description BLOOD BLOOD RIGHT HAND  Final   Special Requests   Final    BOTTLES DRAWN AEROBIC AND ANAEROBIC Blood Culture adequate volume   Culture   Final    NO GROWTH 4 DAYS Performed at [facility],  [place], [place] [address]    Report Status PENDING  Incomplete  Blood culture (routine x 2)     Status: None (Preliminary result)   Collection Time: 12/18/20 10:35 PM   Specimen: BLOOD  Result Value Ref Range Status   Specimen Description BLOOD BLOOD LEFT HAND  Final   Special Requests   Final    BOTTLES  DRAWN AEROBIC AND ANAEROBIC Blood Culture adequate volume   Culture   Final    NO GROWTH 4 DAYS Performed at [facility], [address] [place], [place] [address]    Report Status PENDING  Incomplete  VZV PCR, CSF     Status: None   Collection Time: 12/19/20 10:52 AM   Specimen: Cerebrospinal Fluid  Result Value Ref Range Status   VZV PCR, CSF Negative Negative Final    Comment: (NOTE) No Varicella Zoster Virus DNA detected. Performed At: [facility] [place], [place] [id number] Rush Farmer MD [id number]   CSF culture w Gram Stain     Status: None   Collection Time: 12/20/20  9:45 AM   Specimen: PATH Cytology CSF; Cerebrospinal Fluid  Result Value Ref Range Status   Specimen Description   Final    CSF Performed at [facility], [address] [place], [place] [address]    Special Requests   Final    NONE Performed at [facility], [address] [place], [place] [address]    Gram Stain   Final    WBC SEEN NO RBC SEEN NO ORGANISMS SEEN Performed at [facility], [address] [place], [place] [address]    Culture   Final    NO GROWTH 3 DAYS Performed at [facility], [place] [address] [place], [place] [address]    Report Status 12/23/2020 FINAL  Final     Studies: No results found.  Total time spent: 30 minutes  Barb Merino, MD  [facility] 12/26/2020  If 7PM-7AM, please contact night-coverage

## 2020-12-26 NOTE — Progress Notes (Signed)
Occupational Therapy Treatment Patient Details Name: FEMALE IAFRATE MRN: [id number] DOB: [birth year] Today's Date: 12/26/2020   History of present illness Jennifer Carrillo is an 72yoF who comes [facility] on [facility]/11 from home with weakness adn back/leg pain. Pt admitted with sepsis of unknown source. PMH: DM, HTN, CKD4, CAD, Shingles on 12/05/20 s/p valtrex, CA breast on anastrozole, hyperlipidemia, OSA, discoid lupus. Etiology appears non-infectious per ID, now consulting rheumatology.   OT comments  Upon entering the room, pt supine in bed and is agreeable to OT intervention. She does report 4/10 pain in R knee. Pt performed bed mobility with min A to EOB. Pt stands with heavy max assist from EOB needing assist for anterior weight shift and increased cuing for hand placement on RW. Pt with posterior bias in standing as well. Pt initially side steps along EOB for safety and then able to take several steps to the R to recliner chair with min - mod A for standing balance. Pt performs grooming tasks while seated with set up A to obtain all needed items. Pt remains in recliner chair with chair alarm activated for safety. Pt continues to benefit from OT intervention with recommendation for SNF at discharge.    Recommendations for follow up therapy are one component of a multi-disciplinary discharge planning process, led by the attending physician.  Recommendations may be updated based on patient status, additional functional criteria and insurance authorization.    Follow Up Recommendations  Skilled nursing-short term rehab (<3 hours/day)    Assistance Recommended at Discharge Frequent or constant Supervision/Assistance  Equipment Recommendations  Other (comment) (defer to next venue of care)       Precautions / Restrictions Precautions Precautions: Fall       Mobility Bed Mobility Overal bed mobility: Needs Assistance Bed Mobility: Supine to Sit     Supine to sit: Min assist;HOB elevated           Transfers Overall transfer level: Needs assistance Equipment used: Rolling walker (2 wheels) Transfers: Sit to/from Stand Sit to Stand: Max assist           General transfer comment: Heavy max A to stand from standard bed height with posterior bias noted once standing.     Balance Overall balance assessment: Needs assistance Sitting-balance support: Bilateral upper extremity supported;Feet supported Sitting balance-Leahy Scale: Fair     Standing balance support: Reliant on assistive device for balance;During functional activity Standing balance-Leahy Scale: Poor                             ADL either performed or assessed with clinical judgement   ADL Overall ADL's : Needs assistance/impaired     Grooming: Wash/dry hands;Wash/dry face;Sitting;Set up               Lower Body Dressing: Total assistance;Sitting/lateral leans Lower Body Dressing Details (indicate cue type and reason): donning socks                    Extremity/Trunk Assessment Upper Extremity Assessment Upper Extremity Assessment: Generalized weakness   Lower Extremity Assessment Lower Extremity Assessment: Generalized weakness        Vision Patient Visual Report: No change from baseline            Cognition Arousal/Alertness: Awake/alert Behavior During Therapy: WFL for tasks assessed/performed Overall Cognitive Status: History of cognitive impairments - at baseline  General Comments: Pt is pleasant and cooperative and follows commands with increased time. Pt oriented to location, self , and month.                     Pertinent Vitals/ Pain       Pain Assessment: Faces Faces Pain Scale: Hurts little more Pain Location: R knee Pain Descriptors / Indicators: Aching;Discomfort Pain Intervention(s): Limited activity within patient's tolerance;Monitored during session;Repositioned         Frequency  Min  2X/week        Progress Toward Goals  OT Goals(current goals can now be found in the care plan section)  Progress towards OT goals: Progressing toward goals  Acute Rehab OT Goals Patient Stated Goal: to get better and stronger OT Goal Formulation: With patient Time For Goal Achievement: 01/04/21 Potential to Achieve Goals: Good  Plan Discharge plan remains appropriate;Frequency remains appropriate       AM-PAC OT "6 Clicks" Daily Activity     Outcome Measure   Help from another person eating meals?: None Help from another person taking care of personal grooming?: A Little Help from another person toileting, which includes using toliet, bedpan, or urinal?: Total Help from another person bathing (including washing, rinsing, drying)?: Total Help from another person to put on and taking off regular upper body clothing?: A Lot Help from another person to put on and taking off regular lower body clothing?: Total 6 Click Score: 12    End of Session Equipment Utilized During Treatment: Rolling walker (2 wheels)  OT Visit Diagnosis: Unsteadiness on feet (R26.81);Muscle weakness (generalized) (M62.81);Pain Pain - Right/Left: Right Pain - part of body: Knee   Activity Tolerance Patient tolerated treatment well   Patient Left in chair;with call bell/phone within reach;with chair alarm set   Nurse Communication Mobility status        Time: 3154-0086 OT Time Calculation (min): 23 min  Charges: OT General Charges $OT Visit: 1 Visit OT Treatments $Self Care/Home Management : 23-37 mins  Darleen Crocker, MS, OTR/L , CBIS ascom [phone]  12/26/20, 2:06 PM

## 2020-12-27 ENCOUNTER — Encounter: Payer: Self-pay | Admitting: Internal Medicine

## 2020-12-27 DIAGNOSIS — I120 Hypertensive chronic kidney disease with stage 5 chronic kidney disease or end stage renal disease: Secondary | ICD-10-CM

## 2020-12-27 DIAGNOSIS — N186 End stage renal disease: Secondary | ICD-10-CM

## 2020-12-27 LAB — HEPATITIS B SURFACE ANTIBODY, QUANTITATIVE: Hep B S AB Quant (Post): <3.1 m[IU]/mL — ABNORMAL LOW (ref >9.9)

## 2020-12-27 LAB — GLUCOSE, CAPILLARY
Glucose-Capillary: 225 mg/dL — ABNORMAL HIGH (ref 70–99)
Glucose-Capillary: 240 mg/dL — ABNORMAL HIGH (ref 70–99)
Glucose-Capillary: 236 mg/dL — ABNORMAL HIGH (ref 70–99)
Glucose-Capillary: 297 mg/dL — ABNORMAL HIGH (ref 70–99)

## 2020-12-27 MED ORDER — ALUM & MAG HYDROXIDE-SIMETH 200-200-20 MG/5ML PO SUSP
30.0000 mL | Freq: Four times a day (QID) | ORAL | Status: DC | PRN
  Administered 2020-12-27: 23:00:00 30 mL via ORAL
  Filled 2020-12-27: qty 30

## 2020-12-27 NOTE — Progress Notes (Signed)
Occupational Therapy Treatment Patient Details Name: Jennifer Carrillo MRN: [id number] DOB: 1933/09/21 Today's Date: 12/27/2020   History of present illness Jennifer Carrillo is an 58yoF who comes to [facility] on 12/11 from home with weakness adn back/leg pain. Pt admitted with sepsis of unknown source. PMH: DM, HTN, CKD4, CAD, Shingles on 12/05/20 s/p valtrex, CA breast on anastrozole, hyperlipidemia, OSA, discoid lupus. Etiology appears non-infectious per ID, now consulting rheumatology.   OT comments  Pt seen for OT treatment on this date. Upon arrival to room, pt awake and seated upright in bed following receiving assist for bathing from NT. Pt reporting no pain and agreeable to OT tx. With HOB flat, pt performed bed mobility requiring only SUPERVISION. Once seated EOB, pt able to don/doff socks with MIN GUARD and perform seated grooming tasks with SET-UP assist. Pt performed x3 sit<>stand bouts, with pt initially requiring MAX A for upward momentum but progressing to MOD A with final attempt. Pt stood for 30secs at EOB this date, but was unable to attempt standing grooming tasks d/t decreased balance and activity tolerance. Pt is making good progress toward goals and continues to benefit from skilled OT services to maximize return to PLOF and minimize risk of future falls, injury, caregiver burden, and readmission. Will continue to follow POC. Discharge recommendation remains appropriate.     Recommendations for follow up therapy are one component of a multi-disciplinary discharge planning process, led by the attending physician.  Recommendations may be updated based on patient status, additional functional criteria and insurance authorization.    Follow Up Recommendations  Skilled nursing-short term rehab (<3 hours/day)    Assistance Recommended at Discharge Frequent or constant Supervision/Assistance  Equipment Recommendations  Other (comment) (defer to next venue of care)       Precautions /  Restrictions Precautions Precautions: Fall Restrictions Weight Bearing Restrictions: No       Mobility Bed Mobility Overal bed mobility: Needs Assistance Bed Mobility: Sit to Supine     Supine to sit: Supervision Sit to supine: Supervision   General bed mobility comments: With HOB flat, pt able to perform without physical assist    Transfers Overall transfer level: Needs assistance Equipment used: Rolling walker (2 wheels) Transfers: Sit to/from Stand Sit to Stand: Mod assist;Max assist           General transfer comment: x3 bouts; pt initially requiring MAX A for upward momentum but progressing to MOD A with final attempt     Balance Overall balance assessment: Needs assistance Sitting-balance support: Feet supported;No upper extremity supported Sitting balance-Leahy Scale: Good Sitting balance - Comments: Good sitting balance reaching outside BOS to don socks   Standing balance support: Reliant on assistive device for balance;During functional activity Standing balance-Leahy Scale: Poor Standing balance comment: Requires MN A to maintain static standing balance for 30sec with BUE support from RW                           ADL either performed or assessed with clinical judgement   ADL Overall ADL's : Needs assistance/impaired     Grooming: Wash/dry hands;Wash/dry face;Oral care;Set up;Sitting               Lower Body Dressing: Min guard;Sitting/lateral leans Lower Body Dressing Details (indicate cue type and reason): to don/doff socks                      Cognition Arousal/Alertness: Awake/alert Behavior During  Therapy: WFL for tasks assessed/performed Overall Cognitive Status: History of cognitive impairments - at baseline                                 General Comments: Alert and oriented to self, place, and month. Demonstrates some short term memory deficits                General Comments Hr 70-90s  throughout    Pertinent Vitals/ Pain       Pain Assessment: No/denies pain         Frequency  Min 2X/week        Progress Toward Goals  OT Goals(current goals can now be found in the care plan section)  Progress towards OT goals: Progressing toward goals  Acute Rehab OT Goals Patient Stated Goal: to get better and stronger OT Goal Formulation: With patient Time For Goal Achievement: 01/04/21 Potential to Achieve Goals: Good  Plan Discharge plan remains appropriate;Frequency remains appropriate       AM-PAC OT "6 Clicks" Daily Activity     Outcome Measure   Help from another person eating meals?: None Help from another person taking care of personal grooming?: A Little Help from another person toileting, which includes using toliet, bedpan, or urinal?: A Lot Help from another person bathing (including washing, rinsing, drying)?: A Lot Help from another person to put on and taking off regular upper body clothing?: A Little Help from another person to put on and taking off regular lower body clothing?: A Lot 6 Click Score: 16    End of Session Equipment Utilized During Treatment: Rolling walker (2 wheels);Gait belt  OT Visit Diagnosis: Unsteadiness on feet (R26.81);Muscle weakness (generalized) (M62.81)   Activity Tolerance Patient tolerated treatment well   Patient Left in bed;with call bell/phone within reach;with bed alarm set   Nurse Communication Mobility status        Time: 1521-1550 OT Time Calculation (min): 29 min  Charges: OT General Charges $OT Visit: 1 Visit OT Treatments $Self Care/Home Management : 8-22 mins $Therapeutic Activity: 8-22 mins  Fredirick Maudlin, OTR/L [place]

## 2020-12-27 NOTE — Progress Notes (Signed)
Inpatient Diabetes Program Recommendations  AACE/ADA: New Consensus Statement on Inpatient Glycemic Control  Target Ranges:  Prepandial:   less than 140 mg/dL      Peak postprandial:   less than 180 mg/dL (1-2 hours)      Critically ill patients:  140 - 180 mg/dL    Latest Reference Range & Units 12/26/20 07:59 12/26/20 11:55 12/26/20 16:58 12/26/20 21:25  Glucose-Capillary 70 - 99 mg/dL 179 (H) 290 (H) 390 (H) 335 (H)   Review of Glycemic Control  Diabetes history: DM2 Outpatient Diabetes medications: 70/30 20 units BID Current orders for Inpatient glycemic control: Semglee 8 units QHS, Novolog 0-9 units TID with meals, Novolog 0-5 units QHS; Prednisone 10 mg QAM   Inpatient Diabetes Program Recommendations:     Insulin: If steroids are continued, please consider ordering Novolog 5 units TID with meals for meal coverage if patient eats at least 50% of meals.   Thanks, Barnie Alderman, RN, MSN, CDE Diabetes Coordinator Inpatient Diabetes Program [phone] (Team Pager from 8am to 5pm)

## 2020-12-27 NOTE — Progress Notes (Signed)
PROGRESS NOTE  ARRAYAH CONNORS MWN:[id number] DOB: [birth year] DOA: 12/18/2020 PCP: [name], MD   LOS: 9 days   Brief narrative: 87 y.o. female with past medical history of diabetes mellitus type 2, hypertension, CKD stage IV, coronary artery disease, history of shingles diagnosed on 11/28 treated with Valtrex and gabapentin presented to hospital with 1 day history of diffuse pain extending from her lower back to her legs to the level where she was unable to get out of the bed.  In the emergency room, she was febrile.  Blood pressures were elevated.  Creatinine 4.1.  Baseline creatinine about 3.8-4.  Admitted with broad-spectrum antibiotics.  Remains in the hospital due to worsening renal functions.  Planning for peritoneal dialysis.   Assessment/Plan:  Principal Problem:   Sepsis ([place]) Active Problems:   PAD (peripheral artery disease) ([place])   Coronary artery disease involving native coronary artery of native heart   Anemia of chronic kidney failure, stage 4 (severe) (HCC)   Acute kidney injury superimposed on CKD IV (HCC)   Metabolic acidosis   Rhabdomyolysis   Hypertensive emergency without congestive heart failure   Hyperglycemia due to type 2 diabetes mellitus ([place])   Shingles 12/05/2020    Severe sepsis present on admission with altered mental status and acute kidney injury: Likely because polyarthritis.   Presented with fever, tachycardia and leukocytosis.  Recent history of treated shingles.  Procalcitonin normal.  Lactate normal.   Lumbar puncture without evidence of meningitis, antibiotics discontinued  MRI lumbosacral spine negative for infection.  Urinalysis was unremarkable.  Blood cultures negative so far.   Polyarthritis, seen by rheumatology.     Bilateral knee aspirate with pseudogout, negative cultures.  Steroid injections.   Started patient on low-dose prednisone and taper over 1 week.  Discontinue today. Bilateral x-ray with extensive osteoarthritis.    Use low-dose tramadol for pain relief.  Altered mental status likely metabolic infective encephalopathy. Adequately improved.  Mental status improved.   Acute kidney injury superimposed on CKD IV with metabolic acidosis Progressive azotemia.  Renal ultrasound with no evidence of obstruction. Creatinine continues to rise. Urine output more than 1 L last 24 hours. Followed by nephrology.   Plan for peritoneal dialysis catheter placement and dialysis at home.  Mild cognitive dysfunction with metabolic encephalopathy. At baseline.  Continue delirium precautions.  Resumed Aricept.   Rhabdomyolysis, nontraumatic Improved.   Hypertensive urgency.  Stable.  Hyperglycemia due to type 2 diabetes mellitus  Blood sugars elevated.  Discontinue prednisone today.  We will keep on same insulin doses today.   History of shingles 12/05/2020 Completed Valtrex.   PAD (peripheral artery disease Stable.   Coronary artery disease involving native coronary artery of native heart Patient on digoxin, Ranexa, statin and Plavix. Digoxin level therapeutic, continue with renal dosing. With worsening renal functions, Ranexa is contraindicated.  Discontinue.  History of breast cancer Continue Arimidex from home.   DVT prophylaxis: heparin injection 5,000 Units Start: 12/22/20 1600 SCDs Start: 12/18/20 2238   Code Status: Full code  Family Communication:  None today.  Status is: Inpatient  Remains inpatient appropriate because: Worsening renal functions.  Unsafe discharge plan.  Can be discharged 1 day after initiation of dialysis.  Consultants: Infectious disease Nephrology Rheumatology  Procedures: Lumbar puncture Bilateral knee aspirate and steroid injections  Anti-infectives:  Vancomycin,ampicillin and acyclovir 12/11-12/13 Rocephin 12/11--- 12/14  Anti-infectives (From admission, onward)    Start     Dose/Rate Route Frequency Ordered Stop   12/21/20 1200  cefTRIAXone  (ROCEPHIN) 2 g in sodium chloride 0.9 % 100 mL IVPB  Status:  Discontinued        2 g 200 mL/hr over 30 Minutes Intravenous Every 24 hours 12/20/20 1805 12/21/20 1901   12/21/20 0200  vancomycin (VANCOREADY) IVPB 750 mg/150 mL  Status:  Discontinued        750 mg 150 mL/hr over 60 Minutes Intravenous Every 48 hours 12/18/20 2312 12/20/20 1416   12/20/20 2200  vancomycin (VANCOREADY) IVPB 750 mg/150 mL  Status:  Discontinued        750 mg 150 mL/hr over 60 Minutes Intravenous Every 48 hours 12/20/20 1416 12/20/20 1805   12/19/20 2200  ceFEPIme (MAXIPIME) 2 g in sodium chloride 0.9 % 100 mL IVPB  Status:  Discontinued        2 g 200 mL/hr over 30 Minutes Intravenous Every 24 hours 12/18/20 2309 12/19/20 1114   12/19/20 2200  cefTRIAXone (ROCEPHIN) 2 g in sodium chloride 0.9 % 100 mL IVPB  Status:  Discontinued        2 g 200 mL/hr over 30 Minutes Intravenous Every 12 hours 12/19/20 1114 12/20/20 1805   12/19/20 1130  ampicillin (OMNIPEN) 2 g in sodium chloride 0.9 % 100 mL IVPB  Status:  Discontinued        2 g 300 mL/hr over 20 Minutes Intravenous Every 12 hours 12/19/20 1116 12/20/20 1805   12/19/20 0200  vancomycin (VANCOCIN) IVPB 1000 mg/200 mL premix       See Hyperspace for full Linked Orders Report.   1,000 mg 200 mL/hr over 60 Minutes Intravenous  Once 12/19/20 0042 12/19/20 0204   12/19/20 0200  vancomycin (VANCOREADY) IVPB 1500 mg/300 mL       See Hyperspace for full Linked Orders Report.   1,500 mg 150 mL/hr over 120 Minutes Intravenous  Once 12/19/20 0042 12/19/20 0453   12/19/20 0130  acyclovir (ZOVIRAX) 825 mg in dextrose 5 % 150 mL IVPB  Status:  Discontinued        10 mg/kg  82.3 kg (Adjusted) 166.5 mL/hr over 60 Minutes Intravenous Every 24 hours 12/19/20 0044 12/20/20 1825   12/18/20 2330  acyclovir (ZOVIRAX) 825 mg in dextrose 5 % 150 mL IVPB  Status:  Discontinued        10 mg/kg  82.3 kg (Adjusted) 166.5 mL/hr over 60 Minutes Intravenous Every 24 hours 12/18/20  2318 12/19/20 0044   12/18/20 2300  metroNIDAZOLE (FLAGYL) IVPB 500 mg  Status:  Discontinued        500 mg 100 mL/hr over 60 Minutes Intravenous Every 12 hours 12/18/20 2251 12/19/20 1115   12/18/20 2230  vancomycin (VANCOREADY) IVPB 1500 mg/300 mL  Status:  Discontinued       See Hyperspace for full Linked Orders Report.   1,500 mg 150 mL/hr over 120 Minutes Intravenous  Once 12/18/20 2123 12/19/20 0042   12/18/20 2130  ceFEPIme (MAXIPIME) 2 g in sodium chloride 0.9 % 100 mL IVPB        2 g 200 mL/hr over 30 Minutes Intravenous  Once 12/18/20 2117 12/19/20 0000   12/18/20 2130  metroNIDAZOLE (FLAGYL) IVPB 500 mg  Status:  Discontinued        500 mg 100 mL/hr over 60 Minutes Intravenous  Once 12/18/20 2117 12/18/20 2342   12/18/20 2130  vancomycin (VANCOCIN) IVPB 1000 mg/200 mL premix  Status:  Discontinued        1,000 mg 200 mL/hr over 60 Minutes Intravenous  Once 12/18/20 2117 12/18/20 2123   12/18/20 2130  vancomycin (VANCOCIN) IVPB 1000 mg/200 mL premix  Status:  Discontinued       See Hyperspace for full Linked Orders Report.   1,000 mg 200 mL/hr over 60 Minutes Intravenous  Once 12/18/20 2123 12/19/20 0042       Subjective:  Seen and examined.  No new events.  Aware about dialysis planning.  Wants to go home for Christmas.  Objective: Vitals:   12/27/20 0536 12/27/20 0829  BP: (!) 182/53 (!) 157/49  Pulse: 76 66  Resp: 20 16  Temp: 98.6 F (37 C) 98.4 F (36.9 C)  SpO2: 99% 100%    Intake/Output Summary (Last 24 hours) at 12/27/2020 1320 Last data filed at 12/27/2020 1053 Gross per 24 hour  Intake 240 ml  Output 1100 ml  Net -860 ml   Filed Weights   12/18/20 0751 12/18/20 0755 12/19/20 2056  Weight: 82 kg 113.4 kg 80.5 kg   Body mass index is 27.8 kg/m.   Physical Exam:  General: Looks fairly comfortable.  On room air. Cardiovascular: S1-S2 normal.  Regular rate rhythm. Respiratory: Bilateral clear.  No added sounds. Gastrointestinal: Soft.   Nontender.  Obese and pendulous. Ext: No swelling or edema.  She has generalized tenderness with no swelling or erythema of the both knees. Neuro: Alert and oriented.  Generalized weakness but no focal deficits.   Data Review: I have personally reviewed the following laboratory data and studies,  CBC: Recent Labs  Lab 12/22/20 0202 12/26/20 0309  WBC 12.9* 17.0*  NEUTROABS 10.7*  --   HGB 9.1* 8.0*  HCT 28.0* 24.7*  MCV 81.9 81.0  PLT 347 191*   Basic Metabolic Panel: Recent Labs  Lab 12/22/20 0202 12/23/20 0509 12/24/20 0605 12/25/20 0906 12/26/20 0309  NA 137 133* 136 135 134*  K 3.4* 3.3* 3.5 3.3* 3.7  CL 91* 90* 94* 93* 94*  CO2 33* 29 30 31 29   GLUCOSE 151* 218* 165* 260* 196*  BUN 56* 84* 91* 99* 90*  CREATININE 4.10* 4.86* 5.63* 6.12* 6.21*  CALCIUM 7.8* 7.8* 7.6* 7.3* 7.3*  MG 2.1  --   --   --   --   PHOS 5.6*  --   --  6.4*  --    Liver Function Tests: Recent Labs  Lab 12/25/20 0906  ALBUMIN 2.3*   No results for input(s): LIPASE, AMYLASE in the last 168 hours. No results for input(s): AMMONIA in the last 168 hours. Cardiac Enzymes: No results for input(s): CKTOTAL, CKMB, CKMBINDEX, TROPONINI in the last 168 hours.  BNP (last 3 results) No results for input(s): BNP in the last 8760 hours.  ProBNP (last 3 results) No results for input(s): PROBNP in the last 8760 hours.  CBG: Recent Labs  Lab 12/26/20 1155 12/26/20 1658 12/26/20 2125 12/27/20 0828 12/27/20 1133  GLUCAP 290* 390* 335* 225* 240*   Recent Results (from the past 240 hour(s))  Resp Panel by RT-PCR (Flu A&B, Covid) Nasopharyngeal Swab     Status: None   Collection Time: 12/18/20  5:19 PM   Specimen: Nasopharyngeal Swab; Nasopharyngeal(NP) swabs in vial transport medium  Result Value Ref Range Status   SARS Coronavirus 2 by RT PCR NEGATIVE NEGATIVE Final    Comment: (NOTE) SARS-CoV-2 target nucleic acids are NOT DETECTED.  The SARS-CoV-2 RNA is generally detectable in upper  respiratory specimens during the acute phase of infection. The lowest concentration of SARS-CoV-2 viral copies this assay can  detect is 138 copies/mL. A negative result does not preclude SARS-Cov-2 infection and should not be used as the sole basis for treatment or other patient management decisions. A negative result may occur with  improper specimen collection/handling, submission of specimen other than nasopharyngeal swab, presence of viral mutation(s) within the areas targeted by this assay, and inadequate number of viral copies(<138 copies/mL). A negative result must be combined with clinical observations, patient history, and epidemiological information. The expected result is Negative.  Fact Sheet for Patients:  EntrepreneurPulse.com.au  Fact Sheet for Healthcare Providers:  IncredibleEmployment.be  This test is no t yet approved or cleared by the [place] FDA and  has been authorized for detection and/or diagnosis of SARS-CoV-2 by FDA under an Emergency Use Authorization (EUA). This EUA will remain  in effect (meaning this test can be used) for the duration of the COVID-19 declaration under Section 564(b)(1) of the Act, 21 U.S.C.section 360bbb-3(b)(1), unless the authorization is terminated  or revoked sooner.       Influenza A by PCR NEGATIVE NEGATIVE Final   Influenza B by PCR NEGATIVE NEGATIVE Final    Comment: (NOTE) The Xpert Xpress SARS-CoV-2/FLU/RSV plus assay is intended as an aid in the diagnosis of influenza from Nasopharyngeal swab specimens and should not be used as a sole basis for treatment. Nasal washings and aspirates are unacceptable for Xpert Xpress SARS-CoV-2/FLU/RSV testing.  Fact Sheet for Patients: EntrepreneurPulse.com.au  Fact Sheet for Healthcare Providers: IncredibleEmployment.be  This test is not yet approved or cleared by the [place] FDA and has been  authorized for detection and/or diagnosis of SARS-CoV-2 by FDA under an Emergency Use Authorization (EUA). This EUA will remain in effect (meaning this test can be used) for the duration of the COVID-19 declaration under Section 564(b)(1) of the Act, 21 U.S.C. section 360bbb-3(b)(1), unless the authorization is terminated or revoked.  Performed at [facility], [address], [place], [place] [address]   Urine Culture     Status: Abnormal   Collection Time: 12/18/20  5:30 PM   Specimen: Urine, Clean Catch  Result Value Ref Range Status   Specimen Description   Final    URINE, CLEAN CATCH Performed at [facility], [address] [place], [place] [address]    Special Requests   Final    NONE Performed at [facility], [address] [place], [place] [address]    Culture (A)  Final    <10,000 COLONIES/mL INSIGNIFICANT GROWTH Performed at [facility] [address], [place], [place] [address]    Report Status 12/19/2020 FINAL  Final  Blood culture (routine x 2)     Status: None (Preliminary result)   Collection Time: 12/18/20 10:30 PM   Specimen: BLOOD  Result Value Ref Range Status   Specimen Description BLOOD BLOOD RIGHT HAND  Final   Special Requests   Final    BOTTLES DRAWN AEROBIC AND ANAEROBIC Blood Culture adequate volume   Culture   Final    NO GROWTH 4 DAYS Performed at [facility], [address] [place], [place] [address]    Report Status PENDING  Incomplete  Blood culture (routine x 2)     Status: None (Preliminary result)   Collection Time: 12/18/20 10:35 PM   Specimen: BLOOD  Result Value Ref Range Status   Specimen Description BLOOD BLOOD LEFT HAND  Final   Special Requests   Final    BOTTLES DRAWN AEROBIC AND ANAEROBIC Blood Culture adequate  volume   Culture   Final    NO GROWTH 4 DAYS Performed at [facility], [address] [place], [place] [address]    Report Status PENDING  Incomplete   VZV PCR, CSF     Status: None   Collection Time: 12/19/20 10:52 AM   Specimen: Cerebrospinal Fluid  Result Value Ref Range Status   VZV PCR, CSF Negative Negative Final    Comment: (NOTE) No Varicella Zoster Virus DNA detected. Performed At: [facility] [place], [place] [id number] Rush Farmer MD [id number]   CSF culture w Gram Stain     Status: None   Collection Time: 12/20/20  9:45 AM   Specimen: PATH Cytology CSF; Cerebrospinal Fluid  Result Value Ref Range Status   Specimen Description   Final    CSF Performed at [facility], [address] [place], [place] [address]    Special Requests   Final    NONE Performed at [facility], [address] [place], [place] [address]    Gram Stain   Final    WBC SEEN NO RBC SEEN NO ORGANISMS SEEN Performed at [facility], [address] [place], [place] [address]    Culture   Final    NO GROWTH 3 DAYS Performed at [facility], [place] [address] [place], [place] [address]    Report Status 12/23/2020 FINAL  Final     Studies: No results found.  Total time spent: 25 minutes  Barb Merino, MD  [facility] 12/27/2020  If 7PM-7AM, please contact night-coverage

## 2020-12-27 NOTE — Consult Note (Signed)
Patient ID: Jennifer Carrillo, female   DOB: 12-08-1933, 87 y.o.   MRN: [id number]  HPI Jennifer Carrillo is a 87 y.o. female seen in consultation at the request of Dr. Holley Raring for PD catheter placement.  She does have a significant history of chronic kidney disease, hypertension diabetes and coronary artery disease.  She is actively on Plavix.  He lives with her husband and is able to walk with a walker and does all her ADLs on her own.  He is interested in pursuing home dialysis with peritoneal dialysis. Came to the hospital due to encephalopathy, hypertensive urgency and polyarthritis.  Encephalopathy has resolved and she has improved significantly.  However her kidney function has worsened. She thinks she had a history of hysterectomy but is unsure. CBC shows a hemoglobin of 8 white count of 17 platelets of 424.  There is no evidence of active infection.  Last INR was 1.3. Chest x-ray personally reviewed showing no acute abnormalities    HPI  Past Medical History:  Diagnosis Date   Arthritis    Asthma    Calf pain    Cancer ([place]) 2016   left breast   COPD (chronic obstructive pulmonary disease) (HCC)    Diabetes (HCC)    Discoid lupus    Gastro-esophageal reflux    Glaucoma    Hyperlipemia    Hypertension    Iron deficiency anemia    Joint pain    Leg swelling    Osteoarthritis    PVD (peripheral vascular disease) ([place])    Sinus problem    Sleep apnea    No CPAP/ Can't tolerate   Stroke ([place]) 1987    Past Surgical History:  Procedure Laterality Date   BREAST SURGERY Left    left lumpectomy   CAROTID ANGIOGRAPHY Left 06/25/2018   Procedure: CAROTID ANGIOGRAPHY, possible intervention;  Surgeon: Katha Cabal, MD;  Location: [facility];  Service: Cardiovascular;  Laterality: Left;   CAROTID ENDARTERECTOMY Right    CAROTID PTA/STENT INTERVENTION Left 06/25/2018   Procedure: CAROTID PTA/STENT INTERVENTION;  Surgeon: Katha Cabal, MD;  Location: [facility];  Service: Cardiovascular;  Laterality: Left;   CATARACT EXTRACTION Right 2013   CORONARY STENT PLACEMENT     L. L. E.   EYE SURGERY Bilateral    cataract   LEFT HEART CATH AND CORONARY ANGIOGRAPHY Left 05/06/2018   Procedure: LEFT HEART CATH AND CORONARY ANGIOGRAPHY;  Surgeon: Corey Skains, MD;  Location: [facility];  Service: Cardiovascular;  Laterality: Left;   LOWER EXTREMITY ANGIOGRAPHY Right 01/22/2018   Procedure: LOWER EXTREMITY ANGIOGRAPHY;  Surgeon: Katha Cabal, MD;  Location: [facility];  Service: Cardiovascular;  Laterality: Right;   RENAL ANGIOGRAPHY Left 02/26/2018   Procedure: RENAL ANGIOGRAPHY;  Surgeon: Katha Cabal, MD;  Location: [facility];  Service: Cardiovascular;  Laterality: Left;   STENT PLACEMENT VASCULAR ([facility] HX) Left    stent placement on LLE   TUBAL LIGATION      Family History  Problem Relation Age of Onset   Heart disease Mother    Diabetes Other    Hypertension Other    Diabetes Sister    Lung cancer Brother    Leukemia Daughter    Bladder Cancer Neg Hx    Kidney disease Neg Hx    Prostate cancer Neg Hx     Social History Social History   Tobacco Use   Smoking status: Former  Types: Cigarettes    Quit date: 62    Years since quitting: 35.9   Smokeless tobacco: Never   Tobacco comments:    quit 31 years  Vaping Use   Vaping Use: Never used  Substance Use Topics   Alcohol use: No   Drug use: No    Allergies  Allergen Reactions   Benazepril Nausea Only    Current Facility-Administered Medications  Medication Dose Route Frequency Provider Last Rate Last Admin   acetaminophen (TYLENOL) tablet 650 mg  650 mg Oral Q6H PRN Athena Masse, MD   650 mg at 12/21/20 7591   Or   acetaminophen (TYLENOL) suppository 650 mg  650 mg Rectal Q6H PRN Athena Masse, MD       amLODipine (NORVASC) tablet 10 mg  10 mg Oral Daily Judd Gaudier V, MD   10 mg at 12/27/20 6384   anastrozole  (ARIMIDEX) tablet 1 mg  1 mg Oral QPC supper Pokhrel, Laxman, MD   1 mg at 12/26/20 1717   atorvastatin (LIPITOR) tablet 40 mg  40 mg Oral Daily Barb Merino, MD   40 mg at 12/27/20 6659   digoxin (LANOXIN) tablet 0.0625 mg  0.0625 mg Oral Q48H Pokhrel, Laxman, MD   0.0625 mg at 12/27/20 1233   docusate sodium (COLACE) capsule 100 mg  100 mg Oral BID Sharion Settler, NP   100 mg at 12/27/20 9357   docusate sodium (COLACE) capsule 200 mg  200 mg Oral QHS PRN Pokhrel, Laxman, MD   200 mg at 12/22/20 2128   donepezil (ARICEPT) tablet 10 mg  10 mg Oral QHS Pokhrel, Laxman, MD   10 mg at 12/26/20 2220   heparin injection 5,000 Units  5,000 Units Subcutaneous Q8H Barb Merino, MD   5,000 Units at 12/27/20 0537   hydrALAZINE (APRESOLINE) injection 10 mg  10 mg Intravenous Q4H PRN Athena Masse, MD   10 mg at 12/19/20 1757   hydrALAZINE (APRESOLINE) tablet 100 mg  100 mg Oral Q8H Pokhrel, Laxman, MD   100 mg at 12/27/20 0536   insulin aspart (novoLOG) injection 0-5 Units  0-5 Units Subcutaneous QHS Barb Merino, MD   4 Units at 12/26/20 2224   insulin aspart (novoLOG) injection 0-9 Units  0-9 Units Subcutaneous TID WC Barb Merino, MD   3 Units at 12/27/20 0938   insulin glargine-yfgn (SEMGLEE) injection 8 Units  8 Units Subcutaneous QHS Nicole Kindred A, DO   8 Units at 12/26/20 2224   isosorbide mononitrate (IMDUR) 24 hr tablet 60 mg  60 mg Oral Daily Pokhrel, Laxman, MD   60 mg at 12/27/20 0939   labetalol (NORMODYNE) injection 20 mg  20 mg Intravenous Q2H PRN Pokhrel, Laxman, MD       mometasone-formoterol (DULERA) 100-5 MCG/ACT inhaler 2 puff  2 puff Inhalation BID Pokhrel, Laxman, MD   2 puff at 12/27/20 0939   ondansetron (ZOFRAN) tablet 4 mg  4 mg Oral Q6H PRN Athena Masse, MD       Or   ondansetron [facility]) injection 4 mg  4 mg Intravenous Q6H PRN Athena Masse, MD       predniSONE (DELTASONE) tablet 10 mg  10 mg Oral Q breakfast Barb Merino, MD   10 mg at 12/27/20 0177    senna (SENOKOT) tablet 8.6 mg  1 tablet Oral Daily Sharion Settler, NP   8.6 mg at 12/27/20 0939   traMADol (ULTRAM) tablet 50 mg  50 mg Oral Q12H PRN  Barb Merino, MD   50 mg at 12/26/20 0751     Review of Systems Full ROS  was asked and was negative except for the information on the HPI  Physical Exam Blood pressure (!) 157/49, pulse 66, temperature 98.4 F (36.9 C), resp. rate 16, height 5\' 7"  (1.702 m), weight 80.5 kg, SpO2 100 %. CONSTITUTIONAL: NAD. EYES: Pupils are equal, round, Sclera are non-icteric. EARS, NOSE, MOUTH AND THROAT: She is wearing  a mask. Hearing is intact to voice. LYMPH NODES:  Lymph nodes in the neck are normal. RESPIRATORY:  Lungs are clear. There is normal respiratory effort, with equal breath sounds bilaterally, and without pathologic use of accessory muscles. CARDIOVASCULAR: Heart is regular without murmurs, gallops, or rubs. GI: The abdomen is  soft, nontender, and nondistended. There are no palpable masses. There is no hepatosplenomegaly. There are normal bowel sounds. Lower midline laparotomy scar. GU: Rectal deferred.   MUSCULOSKELETAL: Normal muscle strength and tone. No cyanosis or edema.   SKIN: Turgor is good and there are no pathologic skin lesions or ulcers. NEUROLOGIC: Motor and sensation is grossly normal. Cranial nerves are grossly intact. PSYCH:  Oriented to person, place and time. Affect is normal.  Data Reviewed  I have personally reviewed the patient's imaging, laboratory findings and medical records.    Assessment/Plan 87 year old female with progressive end-stage renal disease in need for dialysis.  She is very interested in pursuing peritoneal dialysis placement.  Procedure discussed with the patient in detail.  Risk, benefits and possible complication.  Including but not limited to: Bleeding, infection, catheter malfunction.  She understands and wishes to proceed.  I have stopped her Plavix and we will tentatively do her on Friday so  we can have a few days of the Plavix.  I have discussed my treatment plan with the nephrology team.  We are all in agreement to proceed with PD catheter placement. Caroleen Hamman, MD FACS General Surgeon 12/27/2020, 12:44 PM

## 2020-12-27 NOTE — Progress Notes (Signed)
[facility]  ROUNDING NOTE   Subjective:   Jennifer Carrillo is a 87 y.o. female with past medical concerns including hypertension, diabetes, CAD, shingles, breast CA, and chronic kidney disease stage 4. She presented to the ed with pain extending from her back to her legs and was not able to get out of bed. Patient was admitted for Bilateral leg pain [M79.604, M79.605] Sepsis ([place]) [A41.9] Acute bilateral low back pain, unspecified whether sciatica present [M54.50] Sepsis without acute organ dysfunction, due to unspecified organism [facility]) [A41.9]  Patient is currently known to our office and is followed by Dr Candiss Norse. She was last seen in the office on 12/12/20.   Update:  Patient sitting up in bed eating breakfast Denies nausea, vomiting, and diarrhea Denies shortness on breath  Recorded urine output of 1.1 L in past 24 hours  Vital signs in last 24 hours:  Temp:  [97.7 F (36.5 C)-98.6 F (37 C)] 98.4 F (36.9 C) (12/20 0829) Pulse Rate:  [66-76] 66 (12/20 0829) Resp:  [16-20] 16 (12/20 0829) BP: (154-182)/(49-53) 157/49 (12/20 0829) SpO2:  [96 %-100 %] 100 % (12/20 0829)  Weight change:  Filed Weights   12/18/20 0751 12/18/20 0755 12/19/20 2056  Weight: 82 kg 113.4 kg 80.5 kg    Intake/Output: I/O last 3 completed shifts: In: 120 [P.O.:120] Out: 1500 [Urine:1500]   Intake/Output this shift:  No intake/output data recorded.  Physical Exam: General: NAD, sitting up in bed  Head: Normocephalic, atraumatic. Moist oral mucosal membranes  Eyes: Anicteric  Lungs:  Clear to auscultation, normal effort  Heart: Regular rate and rhythm  Abdomen:  Soft, nontender, nondistended  Extremities:  no peripheral edema.  Neurologic: Nonfocal, moving all four extremities  Skin: No lesions       Basic Metabolic Panel: Recent Labs  Lab 12/22/20 0202 12/23/20 0509 12/24/20 0605 12/25/20 0906 12/26/20 0309  NA 137 133* 136 135 134*  K 3.4* 3.3* 3.5 3.3* 3.7  CL  91* 90* 94* 93* 94*  CO2 33* 29 30 31 29   GLUCOSE 151* 218* 165* 260* 196*  BUN 56* 84* 91* 99* 90*  CREATININE 4.10* 4.86* 5.63* 6.12* 6.21*  CALCIUM 7.8* 7.8* 7.6* 7.3* 7.3*  MG 2.1  --   --   --   --   PHOS 5.6*  --   --  6.4*  --      Liver Function Tests: Recent Labs  Lab 12/25/20 0906  ALBUMIN 2.3*    No results for input(s): LIPASE, AMYLASE in the last 168 hours. No results for input(s): AMMONIA in the last 168 hours.  CBC: Recent Labs  Lab 12/22/20 0202 12/26/20 0309  WBC 12.9* 17.0*  NEUTROABS 10.7*  --   HGB 9.1* 8.0*  HCT 28.0* 24.7*  MCV 81.9 81.0  PLT 347 424*     Cardiac Enzymes: No results for input(s): CKTOTAL, CKMB, CKMBINDEX, TROPONINI in the last 168 hours.   BNP: Invalid input(s): POCBNP  CBG: Recent Labs  Lab 12/26/20 0759 12/26/20 1155 12/26/20 1658 12/26/20 2125 12/27/20 0828  GLUCAP 179* 290* 390* 335* 225*     Microbiology: Results for orders placed or performed during the hospital encounter of 12/18/20  Resp Panel by RT-PCR (Flu A&B, Covid) Nasopharyngeal Swab     Status: None   Collection Time: 12/18/20  5:19 PM   Specimen: Nasopharyngeal Swab; Nasopharyngeal(NP) swabs in vial transport medium  Result Value Ref Range Status   SARS Coronavirus 2 by RT PCR NEGATIVE NEGATIVE  Final    Comment: (NOTE) SARS-CoV-2 target nucleic acids are NOT DETECTED.  The SARS-CoV-2 RNA is generally detectable in upper respiratory specimens during the acute phase of infection. The lowest concentration of SARS-CoV-2 viral copies this assay can detect is 138 copies/mL. A negative result does not preclude SARS-Cov-2 infection and should not be used as the sole basis for treatment or other patient management decisions. A negative result may occur with  improper specimen collection/handling, submission of specimen other than nasopharyngeal swab, presence of viral mutation(s) within the areas targeted by this assay, and inadequate number of  viral copies(<138 copies/mL). A negative result must be combined with clinical observations, patient history, and epidemiological information. The expected result is Negative.  Fact Sheet for Patients:  EntrepreneurPulse.com.au  Fact Sheet for Healthcare Providers:  IncredibleEmployment.be  This test is no t yet approved or cleared by the [place] FDA and  has been authorized for detection and/or diagnosis of SARS-CoV-2 by FDA under an Emergency Use Authorization (EUA). This EUA will remain  in effect (meaning this test can be used) for the duration of the COVID-19 declaration under Section 564(b)(1) of the Act, 21 U.S.C.section 360bbb-3(b)(1), unless the authorization is terminated  or revoked sooner.       Influenza A by PCR NEGATIVE NEGATIVE Final   Influenza B by PCR NEGATIVE NEGATIVE Final    Comment: (NOTE) The Xpert Xpress SARS-CoV-2/FLU/RSV plus assay is intended as an aid in the diagnosis of influenza from Nasopharyngeal swab specimens and should not be used as a sole basis for treatment. Nasal washings and aspirates are unacceptable for Xpert Xpress SARS-CoV-2/FLU/RSV testing.  Fact Sheet for Patients: EntrepreneurPulse.com.au  Fact Sheet for Healthcare Providers: IncredibleEmployment.be  This test is not yet approved or cleared by the [place] FDA and has been authorized for detection and/or diagnosis of SARS-CoV-2 by FDA under an Emergency Use Authorization (EUA). This EUA will remain in effect (meaning this test can be used) for the duration of the COVID-19 declaration under Section 564(b)(1) of the Act, 21 U.S.C. section 360bbb-3(b)(1), unless the authorization is terminated or revoked.  Performed at [facility], [address], [place], [place] [address]   Urine Culture     Status: Abnormal   Collection Time: 12/18/20  5:30 PM   Specimen: Urine, Clean Catch   Result Value Ref Range Status   Specimen Description   Final    URINE, CLEAN CATCH Performed at [facility], [address] [place], [place] [address]    Special Requests   Final    NONE Performed at [facility], [address] [place], [place] [address]    Culture (A)  Final    <10,000 COLONIES/mL INSIGNIFICANT GROWTH Performed at [facility] [address], [place], [place] [address]    Report Status 12/19/2020 FINAL  Final  Blood culture (routine x 2)     Status: None (Preliminary result)   Collection Time: 12/18/20 10:30 PM   Specimen: BLOOD  Result Value Ref Range Status   Specimen Description BLOOD BLOOD RIGHT HAND  Final   Special Requests   Final    BOTTLES DRAWN AEROBIC AND ANAEROBIC Blood Culture adequate volume   Culture   Final    NO GROWTH 4 DAYS Performed at [facility], [address] [place], [place] [address]    Report Status PENDING  Incomplete  Blood culture (routine x 2)     Status: None (Preliminary result)   Collection Time:  12/18/20 10:35 PM   Specimen: BLOOD  Result Value Ref Range Status   Specimen Description BLOOD BLOOD LEFT HAND  Final   Special Requests   Final    BOTTLES DRAWN AEROBIC AND ANAEROBIC Blood Culture adequate volume   Culture   Final    NO GROWTH 4 DAYS Performed at [facility], [address], [place], [place] [address]    Report Status PENDING  Incomplete  VZV PCR, CSF     Status: None   Collection Time: 12/19/20 10:52 AM   Specimen: Cerebrospinal Fluid  Result Value Ref Range Status   VZV PCR, CSF Negative Negative Final    Comment: (NOTE) No Varicella Zoster Virus DNA detected. Performed At: [facility] [place], [place] [id number] Rush Farmer MD AY:[id number]   CSF culture w Gram Stain     Status: None   Collection Time: 12/20/20  9:45 AM   Specimen: PATH Cytology CSF; Cerebrospinal Fluid  Result Value Ref Range Status   Specimen  Description   Final    CSF Performed at [facility], [address] [place], [place] [address]    Special Requests   Final    NONE Performed at [facility], [address] [place], [place] [address]    Gram Stain   Final    WBC SEEN NO RBC SEEN NO ORGANISMS SEEN Performed at [facility], [address], [place], [place] [address]    Culture   Final    NO GROWTH 3 DAYS Performed at [facility], [place] [address], [place], [place] [address]    Report Status 12/23/2020 FINAL  Final    Coagulation Studies: No results for input(s): LABPROT, INR in the last 72 hours.  Urinalysis: No results for input(s): COLORURINE, LABSPEC, PHURINE, GLUCOSEU, HGBUR, BILIRUBINUR, KETONESUR, PROTEINUR, UROBILINOGEN, NITRITE, LEUKOCYTESUR in the last 72 hours.  Invalid input(s): APPERANCEUR    Imaging: No results found.   Medications:     amLODipine  10 mg Oral Daily   anastrozole  1 mg Oral QPC supper   atorvastatin  40 mg Oral Daily   digoxin  0.0625 mg Oral Q48H   docusate sodium  100 mg Oral BID   donepezil  10 mg Oral QHS   heparin injection (subcutaneous)  5,000 Units Subcutaneous Q8H   hydrALAZINE  100 mg Oral Q8H   insulin aspart  0-5 Units Subcutaneous QHS   insulin aspart  0-9 Units Subcutaneous TID WC   insulin glargine-yfgn  8 Units Subcutaneous QHS   isosorbide mononitrate  60 mg Oral Daily   mometasone-formoterol  2 puff Inhalation BID   predniSONE  10 mg Oral Q breakfast   senna  1 tablet Oral Daily   acetaminophen **OR** acetaminophen, docusate sodium, hydrALAZINE, labetalol, ondansetron **OR** ondansetron (ZOFRAN) IV, traMADol  Assessment/ Plan:  Jennifer Carrillo is a 87 y.o.  female with past medical concerns including hypertension, diabetes, CAD, shingles, breast CA, and chronic kidney disease stage 4. She presented to the ed with pain extending from her back to her legs and was not able to get out of bed. Patient was  admitted for Bilateral leg pain [M79.604, M79.605] Sepsis ([place]) [A41.9] Acute bilateral low back pain, unspecified whether sciatica present [M54.50] Sepsis without acute organ dysfunction, due to unspecified organism ([place]) [A41.9]   End stage renal disease requiring dialysis.   Due to progression of disease and steady decline of renal function without sustainable recovery. We feel  this has progressed to end stage renal disease.   -Urine output of 1.1 L recorded in preceding 24 hours.  -Patient and family agreeable to proceed with peritoneal dialysis catheter placement.  Spoke with surgery who will delay placement due to Plavix.  PD catheter placement scheduled for later this week.  Dialysis coordinator aware of patient and awaiting discharge planning to determine outpatient needs.      Lab Results  Component Value Date   CREATININE 6.21 (H) 12/26/2020   CREATININE 6.12 (H) 12/25/2020   CREATININE 5.63 (H) 12/24/2020    Intake/Output Summary (Last 24 hours) at 12/27/2020 1023 Last data filed at 12/27/2020 0615 Gross per 24 hour  Intake 120 ml  Output 1100 ml  Net -980 ml    2. Anemia of chronic kidney disease Lab Results  Component Value Date   HGB 8.0 (L) 12/26/2020    Will consider subcu EPO.    3. Secondary Hyperparathyroidism: with outpatient labs:phosphorus 3.5, calcium 9.3 on 11/07/20.   Lab Results  Component Value Date   PTH 35 06/27/2018   CALCIUM 7.3 (L) 12/26/2020   PHOS 6.4 (H) 12/25/2020    We will continue to monitor bone mineral metabolites during this admission   4. Diabetes mellitus type II with chronic kidney disease insulin dependent. Home regimen includes NPH. Most recent hemoglobin A1c is 5.3 on 12/18/20. Primary team to manage SSI. Glucose elevated this admission   5.  Hypertension with chronic kidney disease.  Home regimen includes hydrochlorothiazide, isosorbide, telmisartan, amlodipine, and hydralazine.  Telmisartan and HCTZ currently held.  -  BP remains slightly elevated 157/49.   LOS: 9   12/20/202210:23 AM

## 2020-12-27 NOTE — TOC Progression Note (Signed)
Transition of Care [facility] - Progression Note    Patient Details  Name: ZENDA HERSKOWITZ MRN: [id number] Date of Birth: [birth year]  Transition of Care [facility]) CM/SW Contact  Beverly Sessions, RN Phone Number: 12/27/2020, 2:53 PM  Clinical Narrative:    [facility] unable to accepted patient.  Notified daughter.  She states she does not have a preference of home health agency.  Referral made to [facility] with [facility]    Expected Discharge Plan: [place] Barriers to Discharge: Continued Medical Work up  Expected Discharge Plan and Services Expected Discharge Plan: [place]   Discharge Planning Services: CM Consult Post Acute Care Choice: [place] arrangements for the past 2 months: Single Family Home                 DME Arranged: N/A DME Agency: NA                   Social Determinants of Health (SDOH) Interventions    Readmission Risk Interventions Readmission Risk Prevention Plan 12/19/2020  Transportation Screening Complete  Medication Review Press photographer) Complete  PCP or Specialist appointment within 3-5 days of discharge Complete  HRI or Home Care Consult Complete  SW Recovery Care/Counseling Consult Complete  Palliative Care Screening Not [place] Complete  Some recent data might be hidden

## 2020-12-28 LAB — GLUCOSE, CAPILLARY
Glucose-Capillary: 231 mg/dL — ABNORMAL HIGH (ref 70–99)
Glucose-Capillary: 218 mg/dL — ABNORMAL HIGH (ref 70–99)
Glucose-Capillary: 206 mg/dL — ABNORMAL HIGH (ref 70–99)
Glucose-Capillary: 289 mg/dL — ABNORMAL HIGH (ref 70–99)

## 2020-12-28 LAB — BASIC METABOLIC PANEL
Anion gap: 11 (ref 5–15)
BUN: 76 mg/dL — ABNORMAL HIGH (ref 8–23)
CO2: 25 mmol/L (ref 22–32)
Calcium: 7.8 mg/dL — ABNORMAL LOW (ref 8.9–10.3)
Chloride: 95 mmol/L — ABNORMAL LOW (ref 98–111)
Creatinine, Ser: 6.5 mg/dL — ABNORMAL HIGH (ref 0.44–1.00)
GFR, Estimated: 6 mL/min — ABNORMAL LOW (ref >60)
Glucose, Bld: 238 mg/dL — ABNORMAL HIGH (ref 70–99)
Potassium: 4.1 mmol/L (ref 3.5–5.1)
Sodium: 131 mmol/L — ABNORMAL LOW (ref 135–145)

## 2020-12-28 LAB — CULTURE, BLOOD (ROUTINE X 2)
Culture: NO GROWTH
Culture: NO GROWTH
Special Requests: ADEQUATE
Special Requests: ADEQUATE

## 2020-12-28 NOTE — Plan of Care (Signed)

## 2020-12-28 NOTE — Progress Notes (Signed)
[facility]  ROUNDING NOTE   Subjective:   Jennifer Carrillo is a 87 y.o. female with past medical concerns including hypertension, diabetes, CAD, shingles, breast CA, and chronic kidney disease stage 4. She presented to the ed with pain extending from her back to her legs and was not able to get out of bed. Patient was admitted for Bilateral leg pain [M79.604, M79.605] Sepsis ([place]) [A41.9] Acute bilateral low back pain, unspecified whether sciatica present [M54.50] Sepsis without acute organ dysfunction, due to unspecified organism [facility]) [A41.9]  Patient is currently known to our office and is followed by Dr Candiss Norse. She was last seen in the office on 12/12/20.   Update:  Patient seen sitting at bedside working with mobility tech. Completed breakfast tray at bedside, denies nausea and vomiting Denies shortness of breath States family will be visiting later. Patient seen later with her husband at bedside.  Spoke with husband, daughter, granddaughter about dialysis and their concerns.  We answered questions as best our ability.  Vital signs in last 24 hours:  Temp:  [97.9 F (36.6 C)-98.6 F (37 C)] 97.9 F (36.6 C) (12/21 1553) Pulse Rate:  [58-67] 61 (12/21 1553) Resp:  [18-20] 18 (12/21 1553) BP: (156-183)/(45-50) 156/48 (12/21 1553) SpO2:  [98 %-100 %] 98 % (12/21 1553)  Weight change:  Filed Weights   12/18/20 0751 12/18/20 0755 12/19/20 2056  Weight: 82 kg 113.4 kg 80.5 kg    Intake/Output: I/O last 3 completed shifts: In: 120 [P.O.:120] Out: 1000 [Urine:1000]   Intake/Output this shift:  Total I/O In: 360 [P.O.:360] Out: 650 [Urine:650]  Physical Exam: General: NAD, sitting up in bed  Head: Normocephalic, atraumatic. Moist oral mucosal membranes  Eyes: Anicteric  Lungs:  Clear to auscultation, normal effort  Heart: Regular rate and rhythm  Abdomen:  Soft, nontender, nondistended  Extremities:  no peripheral edema.  Neurologic: Nonfocal, moving all  four extremities  Skin: No lesions       Basic Metabolic Panel: Recent Labs  Lab 12/22/20 0202 12/23/20 0509 12/24/20 0605 12/25/20 0906 12/26/20 0309 12/28/20 1233  NA 137 133* 136 135 134* 131*  K 3.4* 3.3* 3.5 3.3* 3.7 4.1  CL 91* 90* 94* 93* 94* 95*  CO2 33* 29 30 31 29 25   GLUCOSE 151* 218* 165* 260* 196* 238*  BUN 56* 84* 91* 99* 90* 76*  CREATININE 4.10* 4.86* 5.63* 6.12* 6.21* 6.50*  CALCIUM 7.8* 7.8* 7.6* 7.3* 7.3* 7.8*  MG 2.1  --   --   --   --   --   PHOS 5.6*  --   --  6.4*  --   --      Liver Function Tests: Recent Labs  Lab 12/25/20 0906  ALBUMIN 2.3*    No results for input(s): LIPASE, AMYLASE in the last 168 hours. No results for input(s): AMMONIA in the last 168 hours.  CBC: Recent Labs  Lab 12/22/20 0202 12/26/20 0309  WBC 12.9* 17.0*  NEUTROABS 10.7*  --   HGB 9.1* 8.0*  HCT 28.0* 24.7*  MCV 81.9 81.0  PLT 347 424*     Cardiac Enzymes: No results for input(s): CKTOTAL, CKMB, CKMBINDEX, TROPONINI in the last 168 hours.   BNP: Invalid input(s): POCBNP  CBG: Recent Labs  Lab 12/27/20 1636 12/27/20 2043 12/28/20 0815 12/28/20 1143 12/28/20 1611  GLUCAP 236* 297* 231* 218* 206*     Microbiology: Results for orders placed or performed during the hospital encounter of 12/18/20  Resp Panel  by RT-PCR (Flu A&B, Covid) Nasopharyngeal Swab     Status: None   Collection Time: 12/18/20  5:19 PM   Specimen: Nasopharyngeal Swab; Nasopharyngeal(NP) swabs in vial transport medium  Result Value Ref Range Status   SARS Coronavirus 2 by RT PCR NEGATIVE NEGATIVE Final    Comment: (NOTE) SARS-CoV-2 target nucleic acids are NOT DETECTED.  The SARS-CoV-2 RNA is generally detectable in upper respiratory specimens during the acute phase of infection. The lowest concentration of SARS-CoV-2 viral copies this assay can detect is 138 copies/mL. A negative result does not preclude SARS-Cov-2 infection and should not be used as the sole basis for  treatment or other patient management decisions. A negative result may occur with  improper specimen collection/handling, submission of specimen other than nasopharyngeal swab, presence of viral mutation(s) within the areas targeted by this assay, and inadequate number of viral copies(<138 copies/mL). A negative result must be combined with clinical observations, patient history, and epidemiological information. The expected result is Negative.  Fact Sheet for Patients:  EntrepreneurPulse.com.au  Fact Sheet for Healthcare Providers:  IncredibleEmployment.be  This test is no t yet approved or cleared by the [place] FDA and  has been authorized for detection and/or diagnosis of SARS-CoV-2 by FDA under an Emergency Use Authorization (EUA). This EUA will remain  in effect (meaning this test can be used) for the duration of the COVID-19 declaration under Section 564(b)(1) of the Act, 21 U.S.C.section 360bbb-3(b)(1), unless the authorization is terminated  or revoked sooner.       Influenza A by PCR NEGATIVE NEGATIVE Final   Influenza B by PCR NEGATIVE NEGATIVE Final    Comment: (NOTE) The Xpert Xpress SARS-CoV-2/FLU/RSV plus assay is intended as an aid in the diagnosis of influenza from Nasopharyngeal swab specimens and should not be used as a sole basis for treatment. Nasal washings and aspirates are unacceptable for Xpert Xpress SARS-CoV-2/FLU/RSV testing.  Fact Sheet for Patients: EntrepreneurPulse.com.au  Fact Sheet for Healthcare Providers: IncredibleEmployment.be  This test is not yet approved or cleared by the [place] FDA and has been authorized for detection and/or diagnosis of SARS-CoV-2 by FDA under an Emergency Use Authorization (EUA). This EUA will remain in effect (meaning this test can be used) for the duration of the COVID-19 declaration under Section 564(b)(1) of the Act, 21  U.S.C. section 360bbb-3(b)(1), unless the authorization is terminated or revoked.  Performed at [facility], [address], [place], [place] [address]   Urine Culture     Status: Abnormal   Collection Time: 12/18/20  5:30 PM   Specimen: Urine, Clean Catch  Result Value Ref Range Status   Specimen Description   Final    URINE, CLEAN CATCH Performed at [facility], [address], [place], [place] [address]    Special Requests   Final    NONE Performed at [facility], [address] [place], [place] [address]    Culture (A)  Final    <10,000 COLONIES/mL INSIGNIFICANT GROWTH Performed at [place] [address], [place], [place] [address]    Report Status 12/19/2020 FINAL  Final  Blood culture (routine x 2)     Status: None   Collection Time: 12/18/20 10:30 PM   Specimen: BLOOD  Result Value Ref Range Status   Specimen Description BLOOD BLOOD RIGHT HAND  Final   Special Requests   Final    BOTTLES DRAWN AEROBIC AND ANAEROBIC Blood Culture adequate volume   Culture  Final    NO GROWTH 10 DAYS Performed at [facility], [address] [place], [place] [address]    Report Status 12/28/2020 FINAL  Final  Blood culture (routine x 2)     Status: None   Collection Time: 12/18/20 10:35 PM   Specimen: BLOOD  Result Value Ref Range Status   Specimen Description BLOOD BLOOD LEFT HAND  Final   Special Requests   Final    BOTTLES DRAWN AEROBIC AND ANAEROBIC Blood Culture adequate volume   Culture   Final    NO GROWTH 10 DAYS Performed at [facility], [address], [place], [place] [address]    Report Status 12/28/2020 FINAL  Final  VZV PCR, CSF     Status: None   Collection Time: 12/19/20 10:52 AM   Specimen: Cerebrospinal Fluid  Result Value Ref Range Status   VZV PCR, CSF Negative Negative Final    Comment: (NOTE) No Varicella Zoster Virus DNA detected. Performed At: [facility] [place], [place] [id number] Rush Farmer MD HR:[id number]   CSF culture w Gram Stain     Status: None   Collection Time: 12/20/20  9:45 AM   Specimen: PATH Cytology CSF; Cerebrospinal Fluid  Result Value Ref Range Status   Specimen Description   Final    CSF Performed at [facility], [address], [place], [place] [address]    Special Requests   Final    NONE Performed at [facility], [address] [place], [place] [address]    Gram Stain   Final    WBC SEEN NO RBC SEEN NO ORGANISMS SEEN Performed at [facility], [address], [place], [place] [address]    Culture   Final    NO GROWTH 3 DAYS Performed at [facility], [place] [address] [place], [place] [address]    Report Status 12/23/2020 FINAL  Final    Coagulation Studies: No results for input(s): LABPROT, INR in the last 72 hours.  Urinalysis: No results for input(s): COLORURINE, LABSPEC, PHURINE, GLUCOSEU, HGBUR, BILIRUBINUR, KETONESUR, PROTEINUR, UROBILINOGEN, NITRITE, LEUKOCYTESUR in the last 72 hours.  Invalid input(s): APPERANCEUR    Imaging: No results found.   Medications:     amLODipine  10 mg Oral Daily   anastrozole  1 mg Oral QPC supper   atorvastatin  40 mg Oral Daily   docusate sodium  100 mg Oral BID   donepezil  10 mg Oral QHS   heparin injection (subcutaneous)  5,000 Units Subcutaneous Q8H   hydrALAZINE  100 mg Oral Q8H   insulin aspart  0-5 Units Subcutaneous QHS   insulin aspart  0-9 Units Subcutaneous TID WC   insulin glargine-yfgn  8 Units Subcutaneous QHS   isosorbide mononitrate  60 mg Oral Daily   mometasone-formoterol  2 puff Inhalation BID   senna  1 tablet Oral Daily   acetaminophen **OR** acetaminophen, alum & mag hydroxide-simeth, docusate sodium, hydrALAZINE, labetalol, ondansetron **OR** ondansetron (ZOFRAN) IV, traMADol  Assessment/ Plan:  Jennifer Carrillo is a 87 y.o.  female with past medical concerns including  hypertension, diabetes, CAD, shingles, breast CA, and chronic kidney disease stage 4. She presented to the ed with pain extending from her back to her legs and was not able to get out of bed. Patient was admitted for Bilateral leg pain [M79.604, M79.605] Sepsis ([place]) [A41.9] Acute bilateral low back pain, unspecified whether sciatica present [M54.50] Sepsis without acute organ  dysfunction, due to unspecified organism ([place]) [A41.9]   End stage renal disease requiring dialysis.   Due to progression of disease and steady decline of renal function without sustainable recovery. We feel this has progressed to end stage renal disease.   -Renal function remains poor.  It is our recommendation to proceed with renal replacement therapy.  -This has been discussed with family and patient during outpatient appointments.  The renal replacement therapy suggested with peritoneal dialysis, based on patient's age and medical history, we felt like this would benefit the patient more than in center.  Family initially agreed and PD catheter placement scheduled for Friday.  After PD education, family decided that in center treatments would be best for patient and her family.  Family voiced their concerns of patient's dementia, lack of adequate sleep at night, and ability for family to be available to help with nightly treatments.  Risk and benefits discussed for both modalities.  Did discuss with family that we can start in center treatments, and visit peritoneal dialysis in the future if interested.   -Vascular consulted for PermCath placement this week.  -PD catheter placement canceled for Friday  -Once PermCath placed, will initiate dialysis.  Dialysis coordinator aware of patient and awaiting outpatient needs   Lab Results  Component Value Date   CREATININE 6.50 (H) 12/28/2020   CREATININE 6.21 (H) 12/26/2020   CREATININE 6.12 (H) 12/25/2020    Intake/Output Summary (Last 24 hours) at 12/28/2020 1642 Last data  filed at 12/28/2020 1443 Gross per 24 hour  Intake 360 ml  Output 650 ml  Net -290 ml    2. Anemia of chronic kidney disease Lab Results  Component Value Date   HGB 8.0 (L) 12/26/2020    Hemoglobin below target.  Will consider EPO with dialysis initiation   3. Secondary Hyperparathyroidism: with outpatient labs:phosphorus 3.5, calcium 9.3 on 11/07/20.   Lab Results  Component Value Date   PTH 35 06/27/2018   CALCIUM 7.8 (L) 12/28/2020   PHOS 6.4 (H) 12/25/2020    Calcium and phosphorus not at goal.  We will continue to monitor   4. Diabetes mellitus type II with chronic kidney disease insulin dependent. Home regimen includes NPH. Most recent hemoglobin A1c is 5.3 on 12/18/20. Primary team to manage SSI. Glucose remains elevated  5.  Hypertension with chronic kidney disease.  Home regimen includes hydrochlorothiazide, isosorbide, telmisartan, amlodipine, and hydralazine.  Telmisartan and HCTZ currently held.  - BP currently 156/48   LOS: 10   12/21/20224:42 PM

## 2020-12-28 NOTE — Progress Notes (Signed)
PROGRESS NOTE  Jennifer Carrillo SEG:[id number] DOB: 1933-07-25 DOA: 12/18/2020 PCP: [name], MD   LOS: 10 days   Brief narrative: 87 y.o. female with past medical history of diabetes mellitus type 2, hypertension, CKD stage IV, coronary artery disease, history of shingles diagnosed on 11/28 treated with Valtrex and gabapentin presented to hospital with 1 day history of diffuse pain extending from her lower back to her legs to the level where she was unable to get out of the bed.  In the emergency room, she was febrile.  Blood pressures were elevated.  Creatinine 4.1.  Baseline creatinine about 3.8-4.  Admitted with broad-spectrum antibiotics.  Remains in the hospital due to worsening renal functions.  Planning for peritoneal dialysis.   Assessment/Plan:  Principal Problem:   Sepsis ([place]) Active Problems:   PAD (peripheral artery disease) ([place])   Coronary artery disease involving native coronary artery of native heart   Anemia of chronic kidney failure, stage 4 (severe) (HCC)   Acute kidney injury superimposed on CKD IV (H[facility])   Metabolic acidosis   Rhabdomyolysis   Hypertensive emergency without congestive heart failure   Hyperglycemia due to type 2 diabetes mellitus ([place])   Shingles 12/05/2020    Severe sepsis present on admission with altered mental status and acute kidney injury: Likely because polyarthritis.   Presented with fever, tachycardia and leukocytosis.  Recent history of treated shingles.  Procalcitonin normal.  Lactate normal.   Lumbar puncture without evidence of meningitis, antibiotics discontinued  MRI lumbosacral spine negative for infection.  Urinalysis was unremarkable.  Blood cultures negative so far.   Polyarthritis, seen by rheumatology.     Bilateral knee aspirate with pseudogout, negative cultures.  Steroid injections.   Started patient on low-dose prednisone and taper over 1 week.  Discontinue today. Bilateral x-ray with extensive osteoarthritis.    Use low-dose tramadol for pain relief.  Altered mental status likely metabolic infective encephalopathy. Adequately improved.  Mental status improved.   Acute kidney injury superimposed on CKD IV with metabolic acidosis Progressive azotemia.  Renal ultrasound with no evidence of obstruction. Creatinine continues to rise. Urine output more than 650 mL last 24 hours. Followed by nephrology.   Plan for peritoneal dialysis catheter placement and dialysis at home. Will inquire with nephrology whether this can be done outpatient. Repeat BMP today.  Mild cognitive dysfunction with metabolic encephalopathy. At baseline.  Continue delirium precautions.  Resumed Aricept.   Rhabdomyolysis, nontraumatic Improved.   Hypertensive urgency.  Stable.  Hyperglycemia due to type 2 diabetes mellitus  Blood sugars elevated.  Discontinue prednisone today.  We will keep on same insulin doses today.   History of shingles 12/05/2020 Completed Valtrex.   PAD (peripheral artery disease Stable.   Coronary artery disease involving native coronary artery of native heart Patient on digoxin, Ranexa, statin and Plavix. Will discontinue digoxin as patient is going on dialysis now.  Not cleared by dialysis. With worsening renal functions, Ranexa was discontinued. Plavix on hold for procedure.  Continue statin.  History of breast cancer Continue Arimidex from home.   DVT prophylaxis: heparin injection 5,000 Units Start: 12/22/20 1600 SCDs Start: 12/18/20 2238   Code Status: Full code  Family Communication:  None today.  Status is: Inpatient  Remains inpatient appropriate because: Worsening renal functions.  Unsafe discharge plan.  Planning for dialysis inpatient.  Consultants: Infectious disease Nephrology Rheumatology  Procedures: Lumbar puncture Bilateral knee aspirate and steroid injections  Anti-infectives:  Vancomycin,ampicillin and acyclovir 12/11-12/13 Rocephin 12/11---  12/14  Anti-infectives (From admission, onward)    Start     Dose/Rate Route Frequency Ordered Stop   12/21/20 1200  cefTRIAXone (ROCEPHIN) 2 g in sodium chloride 0.9 % 100 mL IVPB  Status:  Discontinued        2 g 200 mL/hr over 30 Minutes Intravenous Every 24 hours 12/20/20 1805 12/21/20 1901   12/21/20 0200  vancomycin (VANCOREADY) IVPB 750 mg/150 mL  Status:  Discontinued        750 mg 150 mL/hr over 60 Minutes Intravenous Every 48 hours 12/18/20 2312 12/20/20 1416   12/20/20 2200  vancomycin (VANCOREADY) IVPB 750 mg/150 mL  Status:  Discontinued        750 mg 150 mL/hr over 60 Minutes Intravenous Every 48 hours 12/20/20 1416 12/20/20 1805   12/19/20 2200  ceFEPIme (MAXIPIME) 2 g in sodium chloride 0.9 % 100 mL IVPB  Status:  Discontinued        2 g 200 mL/hr over 30 Minutes Intravenous Every 24 hours 12/18/20 2309 12/19/20 1114   12/19/20 2200  cefTRIAXone (ROCEPHIN) 2 g in sodium chloride 0.9 % 100 mL IVPB  Status:  Discontinued        2 g 200 mL/hr over 30 Minutes Intravenous Every 12 hours 12/19/20 1114 12/20/20 1805   12/19/20 1130  ampicillin (OMNIPEN) 2 g in sodium chloride 0.9 % 100 mL IVPB  Status:  Discontinued        2 g 300 mL/hr over 20 Minutes Intravenous Every 12 hours 12/19/20 1116 12/20/20 1805   12/19/20 0200  vancomycin (VANCOCIN) IVPB 1000 mg/200 mL premix       See Hyperspace for full Linked Orders Report.   1,000 mg 200 mL/hr over 60 Minutes Intravenous  Once 12/19/20 0042 12/19/20 0204   12/19/20 0200  vancomycin (VANCOREADY) IVPB 1500 mg/300 mL       See Hyperspace for full Linked Orders Report.   1,500 mg 150 mL/hr over 120 Minutes Intravenous  Once 12/19/20 0042 12/19/20 0453   12/19/20 0130  acyclovir (ZOVIRAX) 825 mg in dextrose 5 % 150 mL IVPB  Status:  Discontinued        10 mg/kg  82.3 kg (Adjusted) 166.5 mL/hr over 60 Minutes Intravenous Every 24 hours 12/19/20 0044 12/20/20 1825   12/18/20 2330  acyclovir (ZOVIRAX) 825 mg in dextrose 5 % 150 mL  IVPB  Status:  Discontinued        10 mg/kg  82.3 kg (Adjusted) 166.5 mL/hr over 60 Minutes Intravenous Every 24 hours 12/18/20 2318 12/19/20 0044   12/18/20 2300  metroNIDAZOLE (FLAGYL) IVPB 500 mg  Status:  Discontinued        500 mg 100 mL/hr over 60 Minutes Intravenous Every 12 hours 12/18/20 2251 12/19/20 1115   12/18/20 2230  vancomycin (VANCOREADY) IVPB 1500 mg/300 mL  Status:  Discontinued       See Hyperspace for full Linked Orders Report.   1,500 mg 150 mL/hr over 120 Minutes Intravenous  Once 12/18/20 2123 12/19/20 0042   12/18/20 2130  ceFEPIme (MAXIPIME) 2 g in sodium chloride 0.9 % 100 mL IVPB        2 g 200 mL/hr over 30 Minutes Intravenous  Once 12/18/20 2117 12/19/20 0000   12/18/20 2130  metroNIDAZOLE (FLAGYL) IVPB 500 mg  Status:  Discontinued        500 mg 100 mL/hr over 60 Minutes Intravenous  Once 12/18/20 2117 12/18/20 2342   12/18/20 2130  vancomycin (VANCOCIN) IVPB 1000 mg/200  mL premix  Status:  Discontinued        1,000 mg 200 mL/hr over 60 Minutes Intravenous  Once 12/18/20 2117 12/18/20 2123   12/18/20 2130  vancomycin (VANCOCIN) IVPB 1000 mg/200 mL premix  Status:  Discontinued       See Hyperspace for full Linked Orders Report.   1,000 mg 200 mL/hr over 60 Minutes Intravenous  Once 12/18/20 2123 12/19/20 0042       Subjective:  No new events.  Objective: Vitals:   12/28/20 0627 12/28/20 0826  BP: (!) 183/47 (!) 172/50  Pulse: (!) 58 61  Resp: 20 18  Temp: 97.9 F (36.6 C) 98.6 F (37 C)  SpO2: 100% 100%    Intake/Output Summary (Last 24 hours) at 12/28/2020 1157 Last data filed at 12/28/2020 1053 Gross per 24 hour  Intake 120 ml  Output 1250 ml  Net -1130 ml   Filed Weights   12/18/20 0751 12/18/20 0755 12/19/20 2056  Weight: 82 kg 113.4 kg 80.5 kg   Body mass index is 27.8 kg/m.   Physical Exam: General: Looks fairly comfortable.  On room air. Cardiovascular: S1-S2 normal.  Regular rate rhythm. Respiratory: Bilateral clear.   No added sounds. Gastrointestinal: Soft.  Nontender.  Obese and pendulous. Ext: No swelling or edema.  She has generalized tenderness with no swelling or erythema of the both knees. Neuro: Alert and oriented.  Generalized weakness but no focal deficits.   Data Review: I have personally reviewed the following laboratory data and studies,  CBC: Recent Labs  Lab 12/22/20 0202 12/26/20 0309  WBC 12.9* 17.0*  NEUTROABS 10.7*  --   HGB 9.1* 8.0*  HCT 28.0* 24.7*  MCV 81.9 81.0  PLT 347 614*   Basic Metabolic Panel: Recent Labs  Lab 12/22/20 0202 12/23/20 0509 12/24/20 0605 12/25/20 0906 12/26/20 0309  NA 137 133* 136 135 134*  K 3.4* 3.3* 3.5 3.3* 3.7  CL 91* 90* 94* 93* 94*  CO2 33* 29 30 31 29   GLUCOSE 151* 218* 165* 260* 196*  BUN 56* 84* 91* 99* 90*  CREATININE 4.10* 4.86* 5.63* 6.12* 6.21*  CALCIUM 7.8* 7.8* 7.6* 7.3* 7.3*  MG 2.1  --   --   --   --   PHOS 5.6*  --   --  6.4*  --    Liver Function Tests: Recent Labs  Lab 12/25/20 0906  ALBUMIN 2.3*   No results for input(s): LIPASE, AMYLASE in the last 168 hours. No results for input(s): AMMONIA in the last 168 hours. Cardiac Enzymes: No results for input(s): CKTOTAL, CKMB, CKMBINDEX, TROPONINI in the last 168 hours.  BNP (last 3 results) No results for input(s): BNP in the last 8760 hours.  ProBNP (last 3 results) No results for input(s): PROBNP in the last 8760 hours.  CBG: Recent Labs  Lab 12/27/20 1133 12/27/20 1636 12/27/20 2043 12/28/20 0815 12/28/20 1143  GLUCAP 240* 236* 297* 231* 218*   Recent Results (from the past 240 hour(s))  Resp Panel by RT-PCR (Flu A&B, Covid) Nasopharyngeal Swab     Status: None   Collection Time: 12/18/20  5:19 PM   Specimen: Nasopharyngeal Swab; Nasopharyngeal(NP) swabs in vial transport medium  Result Value Ref Range Status   SARS Coronavirus 2 by RT PCR NEGATIVE NEGATIVE Final    Comment: (NOTE) SARS-CoV-2 target nucleic acids are NOT DETECTED.  The  SARS-CoV-2 RNA is generally detectable in upper respiratory specimens during the acute phase of infection. The lowest concentration of  SARS-CoV-2 viral copies this assay can detect is 138 copies/mL. A negative result does not preclude SARS-Cov-2 infection and should not be used as the sole basis for treatment or other patient management decisions. A negative result may occur with  improper specimen collection/handling, submission of specimen other than nasopharyngeal swab, presence of viral mutation(s) within the areas targeted by this assay, and inadequate number of viral copies(<138 copies/mL). A negative result must be combined with clinical observations, patient history, and epidemiological information. The expected result is Negative.  Fact Sheet for Patients:  EntrepreneurPulse.com.au  Fact Sheet for Healthcare Providers:  IncredibleEmployment.be  This test is no t yet approved or cleared by the [place] FDA and  has been authorized for detection and/or diagnosis of SARS-CoV-2 by FDA under an Emergency Use Authorization (EUA). This EUA will remain  in effect (meaning this test can be used) for the duration of the COVID-19 declaration under Section 564(b)(1) of the Act, 21 U.S.C.section 360bbb-3(b)(1), unless the authorization is terminated  or revoked sooner.       Influenza A by PCR NEGATIVE NEGATIVE Final   Influenza B by PCR NEGATIVE NEGATIVE Final    Comment: (NOTE) The Xpert Xpress SARS-CoV-2/FLU/RSV plus assay is intended as an aid in the diagnosis of influenza from Nasopharyngeal swab specimens and should not be used as a sole basis for treatment. Nasal washings and aspirates are unacceptable for Xpert Xpress SARS-CoV-2/FLU/RSV testing.  Fact Sheet for Patients: EntrepreneurPulse.com.au  Fact Sheet for Healthcare Providers: IncredibleEmployment.be  This test is not yet approved or  cleared by the [place] FDA and has been authorized for detection and/or diagnosis of SARS-CoV-2 by FDA under an Emergency Use Authorization (EUA). This EUA will remain in effect (meaning this test can be used) for the duration of the COVID-19 declaration under Section 564(b)(1) of the Act, 21 U.S.C. section 360bbb-3(b)(1), unless the authorization is terminated or revoked.  Performed at [facility], [address], [place], [place] [address]   Urine Culture     Status: Abnormal   Collection Time: 12/18/20  5:30 PM   Specimen: Urine, Clean Catch  Result Value Ref Range Status   Specimen Description   Final    URINE, CLEAN CATCH Performed at [facility], [address], [place], [place] [address]    Special Requests   Final    NONE Performed at [facility] [address], [place], [place] [address]    Culture (A)  Final    <10,000 COLONIES/mL INSIGNIFICANT GROWTH Performed at [facility] [address], [place], [place] [address]    Report Status 12/19/2020 FINAL  Final  Blood culture (routine x 2)     Status: None (Preliminary result)   Collection Time: 12/18/20 10:30 PM   Specimen: BLOOD  Result Value Ref Range Status   Specimen Description BLOOD BLOOD RIGHT HAND  Final   Special Requests   Final    BOTTLES DRAWN AEROBIC AND ANAEROBIC Blood Culture adequate volume   Culture   Final    NO GROWTH 4 DAYS Performed at [facility], [address], [place], [place] [address]    Report Status PENDING  Incomplete  Blood culture (routine x 2)     Status: None (Preliminary result)   Collection Time: 12/18/20 10:35 PM   Specimen: BLOOD  Result Value Ref Range Status   Specimen Description BLOOD BLOOD LEFT HAND  Final   Special Requests   Final    BOTTLES DRAWN  AEROBIC AND ANAEROBIC Blood Culture adequate volume   Culture   Final    NO GROWTH 4 DAYS Performed at [facility], [address] [place], [place]  [address]    Report Status PENDING  Incomplete  VZV PCR, CSF     Status: None   Collection Time: 12/19/20 10:52 AM   Specimen: Cerebrospinal Fluid  Result Value Ref Range Status   VZV PCR, CSF Negative Negative Final    Comment: (NOTE) No Varicella Zoster Virus DNA detected. Performed At: [facility] [place], [place] [id number] Rush Farmer MD PQ:[id number]   CSF culture w Gram Stain     Status: None   Collection Time: 12/20/20  9:45 AM   Specimen: PATH Cytology CSF; Cerebrospinal Fluid  Result Value Ref Range Status   Specimen Description   Final    CSF Performed at [facility], [address] [place], [place] [address]    Special Requests   Final    NONE Performed at [facility], [address] [place], [place] [address]    Gram Stain   Final    WBC SEEN NO RBC SEEN NO ORGANISMS SEEN Performed at [facility], [address] [place], [place] [address]    Culture   Final    NO GROWTH 3 DAYS Performed at [facility], [place] [address] [place], [place] [address]    Report Status 12/23/2020 FINAL  Final     Studies: No results found.  Total time spent: 25 minutes  Barb Merino, MD  [facility] 12/28/2020  If 7PM-7AM, please contact night-coverage

## 2020-12-28 NOTE — Progress Notes (Signed)
Mobility Specialist - Progress Note   12/28/20 1200  Mobility  Activity Ambulated in room;Dangled on edge of bed;Sat and stood x 3;Turned to right side  Range of Motion/Exercises Right leg;Left leg  Level of Assistance Minimal assist, patient does 75% or more  Assistive Device Front wheel walker  Distance Ambulated (ft) 8 ft  Mobility Sit up in bed/chair position for meals;Ambulated with assistance in room  Mobility Response Tolerated well  Mobility performed by Mobility specialist  $Mobility charge 1 Mobility    Pt lying in bed upon arrival, utilizing RA. Pt able to sit EOB with supervision and extra time. Denied dizziness/lightheadedness. Pt participated in seated therex prior to OOB activity. MinA and extra time to stand. Pt able to take several steps towards foot of bed and HOB with supervision. Does fatigue quickly, rest break taken. O2 maintained 90s, HR 70s-80s. Noted soiled sheets upon standing, mobility provided linen change. Pt returned supine, alarm set and needs in reach.    Kathee Delton Mobility Specialist 12/28/20, 1:22 PM

## 2020-12-29 ENCOUNTER — Encounter
Admission: EM | Disposition: A | Discharge status: Home Health | Payer: Self-pay | Source: Home / Self Care | Attending: Internal Medicine

## 2020-12-29 ENCOUNTER — Encounter: Payer: Self-pay | Admitting: Vascular Surgery

## 2020-12-29 DIAGNOSIS — N186 End stage renal disease: Secondary | ICD-10-CM | POA: Diagnosis not present

## 2020-12-29 DIAGNOSIS — N185 Chronic kidney disease, stage 5: Secondary | ICD-10-CM | POA: Diagnosis not present

## 2020-12-29 DIAGNOSIS — Z992 Dependence on renal dialysis: Secondary | ICD-10-CM | POA: Diagnosis not present

## 2020-12-29 HISTORY — PX: DIALYSIS/PERMA CATHETER INSERTION: CATH118288

## 2020-12-29 LAB — GLUCOSE, CAPILLARY
Glucose-Capillary: 156 mg/dL — ABNORMAL HIGH (ref 70–99)
Glucose-Capillary: 126 mg/dL — ABNORMAL HIGH (ref 70–99)
Glucose-Capillary: 223 mg/dL — ABNORMAL HIGH (ref 70–99)
Glucose-Capillary: 254 mg/dL — ABNORMAL HIGH (ref 70–99)

## 2020-12-29 SURGERY — DIALYSIS/PERMA CATHETER INSERTION
Anesthesia: Moderate Sedation

## 2020-12-29 MED ORDER — SODIUM CHLORIDE 0.9 % IV SOLN: 100.0000 mL | INTRAVENOUS | Status: DC | PRN

## 2020-12-29 MED ORDER — MIDAZOLAM HCL 2 MG/2ML IJ SOLN
INTRAMUSCULAR | Status: AC
  Filled 2020-12-29: qty 2

## 2020-12-29 MED ORDER — LIDOCAINE-PRILOCAINE 2.5-2.5 % EX CREA
1.0000 "application " | TOPICAL_CREAM | CUTANEOUS | Status: DC | PRN
  Filled 2020-12-29: qty 5

## 2020-12-29 MED ORDER — SODIUM CHLORIDE 0.9 % IV SOLN: INTRAVENOUS | Status: DC

## 2020-12-29 MED ORDER — MIDAZOLAM HCL 2 MG/2ML IJ SOLN
INTRAMUSCULAR | Status: DC | PRN
  Administered 2020-12-29: 1 mg via INTRAVENOUS

## 2020-12-29 MED ORDER — CEFAZOLIN SODIUM-DEXTROSE 1-4 GM/50ML-% IV SOLN
INTRAVENOUS | Status: AC
  Administered 2020-12-29: 10:00:00 1 g via INTRAVENOUS
  Filled 2020-12-29: qty 50

## 2020-12-29 MED ORDER — HEPARIN SODIUM (PORCINE) 1000 UNIT/ML DIALYSIS: 1000.0000 [IU] | INTRAMUSCULAR | Status: DC | PRN

## 2020-12-29 MED ORDER — LIDOCAINE HCL (PF) 1 % IJ SOLN
5.0000 mL | INTRAMUSCULAR | Status: DC | PRN
  Filled 2020-12-29: qty 5

## 2020-12-29 MED ORDER — CHLORHEXIDINE GLUCONATE CLOTH 2 % EX PADS
6.0000 | MEDICATED_PAD | Freq: Every day | CUTANEOUS | Status: DC
  Administered 2020-12-29 – 2021-01-01 (×4): 6 via TOPICAL

## 2020-12-29 MED ORDER — ALTEPLASE 2 MG IJ SOLR: 2.0000 mg | Freq: Once | INTRAMUSCULAR | Status: DC | PRN

## 2020-12-29 MED ORDER — LABETALOL HCL 5 MG/ML IV SOLN
INTRAVENOUS | Status: DC | PRN
  Administered 2020-12-29: 10 mg via INTRAVENOUS

## 2020-12-29 MED ORDER — CEFAZOLIN SODIUM-DEXTROSE 1-4 GM/50ML-% IV SOLN: 1.0000 g | INTRAVENOUS | Status: AC

## 2020-12-29 MED ORDER — LABETALOL HCL 5 MG/ML IV SOLN
INTRAVENOUS | Status: AC
  Filled 2020-12-29: qty 4

## 2020-12-29 MED ORDER — FENTANYL CITRATE PF 50 MCG/ML IJ SOSY
PREFILLED_SYRINGE | INTRAMUSCULAR | Status: AC
  Filled 2020-12-29: qty 1

## 2020-12-29 MED ORDER — CEFAZOLIN SODIUM-DEXTROSE 2-4 GM/100ML-% IV SOLN: 2.0000 g | INTRAVENOUS | Status: DC

## 2020-12-29 MED ORDER — PENTAFLUOROPROP-TETRAFLUOROETH EX AERO
1.0000 "application " | INHALATION_SPRAY | CUTANEOUS | Status: DC | PRN
  Filled 2020-12-29: qty 30

## 2020-12-29 MED ORDER — FENTANYL CITRATE (PF) 100 MCG/2ML IJ SOLN
INTRAMUSCULAR | Status: DC | PRN
  Administered 2020-12-29: 25 ug via INTRAVENOUS

## 2020-12-29 SURGICAL SUPPLY — 9 items
ADH SKN CLS APL DERMABOND .7 (GAUZE/BANDAGES/DRESSINGS) ×1
BIOPATCH RED 1 DISK 7.0 (GAUZE/BANDAGES/DRESSINGS) ×1 IMPLANT
CATH CANNON HEMO 15FR 19 (HEMODIALYSIS SUPPLIES) ×1 IMPLANT
COVER PROBE U/S 5X48 (MISCELLANEOUS) ×1 IMPLANT
DERMABOND ADVANCED (GAUZE/BANDAGES/DRESSINGS) ×1
DERMABOND ADVANCED .7 DNX12 (GAUZE/BANDAGES/DRESSINGS) IMPLANT
PACK ANGIOGRAPHY (CUSTOM PROCEDURE TRAY) ×1 IMPLANT
SUT MNCRL AB 4-0 PS2 18 (SUTURE) ×1 IMPLANT
SUT PROLENE 0 CT 1 30 (SUTURE) ×1 IMPLANT

## 2020-12-29 NOTE — Progress Notes (Signed)
OT Cancellation Note  Patient Details Name: Jennifer Carrillo MRN: [id number] DOB: 10/09/1933   Cancelled Treatment:    Reason Eval/Treat Not Completed:  (pt off floor for HD. Will re-attempt as able.Shanon Payor, OTD OTR/L  12/29/20, 3:11 PM

## 2020-12-29 NOTE — Progress Notes (Signed)
Patient began HD with a scheduled 2-hour treatment, via right neck catheter. CVC positional,requiring several interventions during treatment. Patient extremely pleasant and cooperative, completed session, no fluid removal this session. VSS, afebrile. Nursing report given to primary nurse, patient return to floor. Notable, patient will have second treatment on 12/30/20 for 2.5-hours.

## 2020-12-29 NOTE — Op Note (Signed)
OPERATIVE NOTE    PRE-OPERATIVE DIAGNOSIS: 1. ESRD   POST-OPERATIVE DIAGNOSIS: same as above  PROCEDURE: Ultrasound guidance for vascular access to the right internal jugular vein Fluoroscopic guidance for placement of catheter Placement of a 19 cm tip to cuff tunneled hemodialysis catheter via the right internal jugular vein  SURGEON: Leotis Pain, MD  ANESTHESIA:  Local with Moderate conscious sedation for approximately 18 minutes using 1 mg of Versed and 25 mcg of Fentanyl  ESTIMATED BLOOD LOSS: 5 cc  FLUORO TIME: less than one minute  CONTRAST: none  FINDING(S): 1.  Patent right internal jugular vein  SPECIMEN(S):  None  INDICATIONS:   Jennifer Carrillo is a 87 y.o.female who presents with renal failure.  The patient needs long term dialysis access for their ESRD, and a Permcath is necessary.  Risks and benefits are discussed and informed consent is obtained.    DESCRIPTION: After obtaining full informed written consent, the patient was brought back to the vascular suited. The patient's right neck and chest were sterilely prepped and draped in a sterile surgical field was created. Moderate conscious sedation was administered during a face to face encounter with the patient throughout the procedure with my supervision of the RN administering medicines and monitoring the patient's vital signs, pulse oximetry, telemetry and mental status throughout from the start of the procedure until the patient was taken to the recovery room.  The right internal jugular vein was visualized with ultrasound and found to be patent. It was then accessed under direct ultrasound guidance and a permanent image was recorded. A wire was placed. After skin nick and dilatation, the peel-away sheath was placed over the wire. I then turned my attention to an area under the clavicle. Approximately 1-2 fingerbreadths below the clavicle a small counterincision was created and tunneled from the subclavicular  incision to the access site. Using fluoroscopic guidance, a 19 centimeter tip to cuff tunneled hemodialysis catheter was selected, and tunneled from the subclavicular incision to the access site. It was then placed through the peel-away sheath and the peel-away sheath was removed. Using fluoroscopic guidance the catheter tips were parked in the right atrium. The appropriate distal connectors were placed. It withdrew blood well and flushed easily with heparinized saline and a concentrated heparin solution was then placed. It was secured to the chest wall with 2 Prolene sutures. The access incision was closed single 4-0 Monocryl. A 4-0 Monocryl pursestring suture was placed around the exit site. Sterile dressings were placed. The patient tolerated the procedure well and was taken to the recovery room in stable condition.  COMPLICATIONS: None  CONDITION: Stable  Leotis Pain, MD 12/29/2020 10:38 AM   This note was created with Dragon Medical transcription system. Any errors in dictation are purely unintentional.

## 2020-12-29 NOTE — Progress Notes (Signed)
Inpatient Diabetes Program Recommendations  AACE/ADA: New Consensus Statement on Inpatient Glycemic Control   Target Ranges:  Prepandial:   less than 140 mg/dL      Peak postprandial:   less than 180 mg/dL (1-2 hours)      Critically ill patients:  140 - 180 mg/dL     Latest Reference Range & Units 12/28/20 08:15 12/28/20 11:43 12/28/20 16:11 12/28/20 20:45 12/29/20 07:53  Glucose-Capillary 70 - 99 mg/dL 231 (H) 218 (H) 206 (H) 289 (H) 156 (H)   Review of Glycemic Control  Diabetes history: DM2 Outpatient Diabetes medications: 70/30 20 units BID Current orders for Inpatient glycemic control: Semglee 8 units QHS, Novolog 0-9 units TID with meals, Novolog 0-5 units QHS   Inpatient Diabetes Program Recommendations:     Insulin: Once diet resumed, please consider ordering Novolog 3 units TID with meals for meal coverage if patient eats at least 50% of meals.   Thanks, Barnie Alderman, RN, MSN, CDE Diabetes Coordinator Inpatient Diabetes Program [phone] (Team Pager from 8am to 5pm)

## 2020-12-29 NOTE — Progress Notes (Signed)
Mobility Specialist - Progress Note   12/29/20 1300  Mobility  Activity Off unit  Mobility performed by Mobility specialist    Pt off unit for HD tx. Will attempt another date/time.    Kathee Delton Mobility Specialist 12/29/20, 1:22 PM

## 2020-12-29 NOTE — Progress Notes (Signed)
PROGRESS NOTE  Jennifer Carrillo HKV:[id number] DOB: [birth year] DOA: 12/18/2020 PCP: [name], MD   LOS: 11 days   Brief narrative: 87 y.o. female with past medical history of diabetes mellitus type 2, hypertension, CKD stage IV, coronary artery disease, history of shingles diagnosed on 11/28 treated with Valtrex and gabapentin presented to hospital with 1 day history of diffuse pain extending from her lower back to her legs to the level where she was unable to get out of the bed.  In the emergency room, she was febrile.  Blood pressures were elevated.  Creatinine 4.1.  Baseline creatinine about 3.8-4.  Admitted with broad-spectrum antibiotics.  Remains in the hospital due to worsening renal functions.  12/22, permacath and Initiating hemodialysis.   Assessment/Plan:  Principal Problem:   Sepsis ([place]) Active Problems:   PAD (peripheral artery disease) ([place])   Coronary artery disease involving native coronary artery of native heart   Anemia of chronic kidney failure, stage 4 (severe) ([facility])   Acute kidney injury superimposed on CKD IV ([facility])   Metabolic acidosis   Rhabdomyolysis   Hypertensive emergency without congestive heart failure   Hyperglycemia due to type 2 diabetes mellitus ([place])   Shingles 12/05/2020    Severe sepsis present on admission with altered mental status and acute kidney injury: Likely because polyarthritis.   Presented with fever, tachycardia and leukocytosis.  Recent history of treated shingles.  Procalcitonin normal.  Lactate normal.   Lumbar puncture without evidence of meningitis, antibiotics discontinued  MRI lumbosacral spine negative for infection.  Urinalysis was unremarkable.  Blood cultures negative so far.   Polyarthritis, seen by rheumatology.     Bilateral knee aspirate with pseudogout, negative cultures.  Steroid injections.   Started patient on low-dose prednisone and taper over 1 week.  Discontinue today. Bilateral x-ray with extensive  osteoarthritis.   Use low-dose tramadol for pain relief.  Altered mental status likely metabolic infective encephalopathy. Adequately improved.  Mental status improved.   Acute kidney injury superimposed on CKD IV with metabolic acidosis Progressive azotemia.  Renal ultrasound with no evidence of obstruction. Creatinine continues to rise. Urine output more than thousand mL last 24 hours. Followed by nephrology.   Permacath today.  Dialysis today.  Mild cognitive dysfunction with metabolic encephalopathy. At baseline.  Continue delirium precautions.  Resumed Aricept.   Rhabdomyolysis, nontraumatic Improved.   Hypertensive urgency.  Stable.  Hyperglycemia due to type 2 diabetes mellitus  Blood sugars elevated.  Prednisone discontinued.  N.p.o. today.  Keep on current insulin doses.   History of shingles 12/05/2020 Completed Valtrex.   PAD (peripheral artery disease Stable.   Coronary artery disease involving native coronary artery of native heart Patient on digoxin, Ranexa, statin and Plavix. Will discontinue digoxin as patient is going on dialysis now.  Not cleared by dialysis. With worsening renal functions, Ranexa was discontinued. Plavix on hold for procedure.  Continue statin.  History of breast cancer Continue Arimidex from home.   DVT prophylaxis: heparin injection 5,000 Units Start: 12/22/20 1600 SCDs Start: 12/18/20 2238   Code Status: Full code  Family Communication:  None today.  Status is: Inpatient  Remains inpatient appropriate because: Inpatient procedures.  Plan for dialysis.  Consultants: Infectious disease Nephrology Rheumatology  Procedures: Lumbar puncture Bilateral knee aspirate and steroid injections  Anti-infectives:  Vancomycin,ampicillin and acyclovir 12/11-12/13 Rocephin 12/11--- 12/14  Anti-infectives (From admission, onward)    Start     Dose/Rate Route Frequency Ordered Stop   12/29/20 0954  ceFAZolin (ANCEF)  IVPB 1 g/50  mL premix        1 g 100 mL/hr over 30 Minutes Intravenous 30 min pre-op 12/29/20 0954 12/29/20 1054   12/29/20 0847  ceFAZolin (ANCEF) IVPB 2g/100 mL premix  Status:  Discontinued        2 g 200 mL/hr over 30 Minutes Intravenous 30 min pre-op 12/29/20 0847 12/29/20 0954   12/21/20 1200  cefTRIAXone (ROCEPHIN) 2 g in sodium chloride 0.9 % 100 mL IVPB  Status:  Discontinued        2 g 200 mL/hr over 30 Minutes Intravenous Every 24 hours 12/20/20 1805 12/21/20 1901   12/21/20 0200  vancomycin (VANCOREADY) IVPB 750 mg/150 mL  Status:  Discontinued        750 mg 150 mL/hr over 60 Minutes Intravenous Every 48 hours 12/18/20 2312 12/20/20 1416   12/20/20 2200  vancomycin (VANCOREADY) IVPB 750 mg/150 mL  Status:  Discontinued        750 mg 150 mL/hr over 60 Minutes Intravenous Every 48 hours 12/20/20 1416 12/20/20 1805   12/19/20 2200  ceFEPIme (MAXIPIME) 2 g in sodium chloride 0.9 % 100 mL IVPB  Status:  Discontinued        2 g 200 mL/hr over 30 Minutes Intravenous Every 24 hours 12/18/20 2309 12/19/20 1114   12/19/20 2200  cefTRIAXone (ROCEPHIN) 2 g in sodium chloride 0.9 % 100 mL IVPB  Status:  Discontinued        2 g 200 mL/hr over 30 Minutes Intravenous Every 12 hours 12/19/20 1114 12/20/20 1805   12/19/20 1130  ampicillin (OMNIPEN) 2 g in sodium chloride 0.9 % 100 mL IVPB  Status:  Discontinued        2 g 300 mL/hr over 20 Minutes Intravenous Every 12 hours 12/19/20 1116 12/20/20 1805   12/19/20 0200  vancomycin (VANCOCIN) IVPB 1000 mg/200 mL premix       See Hyperspace for full Linked Orders Report.   1,000 mg 200 mL/hr over 60 Minutes Intravenous  Once 12/19/20 0042 12/19/20 0204   12/19/20 0200  vancomycin (VANCOREADY) IVPB 1500 mg/300 mL       See Hyperspace for full Linked Orders Report.   1,500 mg 150 mL/hr over 120 Minutes Intravenous  Once 12/19/20 0042 12/19/20 0453   12/19/20 0130  acyclovir (ZOVIRAX) 825 mg in dextrose 5 % 150 mL IVPB  Status:  Discontinued        10 mg/kg   82.3 kg (Adjusted) 166.5 mL/hr over 60 Minutes Intravenous Every 24 hours 12/19/20 0044 12/20/20 1825   12/18/20 2330  acyclovir (ZOVIRAX) 825 mg in dextrose 5 % 150 mL IVPB  Status:  Discontinued        10 mg/kg  82.3 kg (Adjusted) 166.5 mL/hr over 60 Minutes Intravenous Every 24 hours 12/18/20 2318 12/19/20 0044   12/18/20 2300  metroNIDAZOLE (FLAGYL) IVPB 500 mg  Status:  Discontinued        500 mg 100 mL/hr over 60 Minutes Intravenous Every 12 hours 12/18/20 2251 12/19/20 1115   12/18/20 2230  vancomycin (VANCOREADY) IVPB 1500 mg/300 mL  Status:  Discontinued       See Hyperspace for full Linked Orders Report.   1,500 mg 150 mL/hr over 120 Minutes Intravenous  Once 12/18/20 2123 12/19/20 0042   12/18/20 2130  ceFEPIme (MAXIPIME) 2 g in sodium chloride 0.9 % 100 mL IVPB        2 g 200 mL/hr over 30 Minutes Intravenous  Once 12/18/20  2117 12/19/20 0000   12/18/20 2130  metroNIDAZOLE (FLAGYL) IVPB 500 mg  Status:  Discontinued        500 mg 100 mL/hr over 60 Minutes Intravenous  Once 12/18/20 2117 12/18/20 2342   12/18/20 2130  vancomycin (VANCOCIN) IVPB 1000 mg/200 mL premix  Status:  Discontinued        1,000 mg 200 mL/hr over 60 Minutes Intravenous  Once 12/18/20 2117 12/18/20 2123   12/18/20 2130  vancomycin (VANCOCIN) IVPB 1000 mg/200 mL premix  Status:  Discontinued       See Hyperspace for full Linked Orders Report.   1,000 mg 200 mL/hr over 60 Minutes Intravenous  Once 12/18/20 2123 12/19/20 0042       Subjective:  Patient seen and examined.  Was going for procedure today.  She is aware about going to dialysis. She is looking forward to go home to celebrate Christmas.  Objective: Vitals:   12/29/20 1048 12/29/20 1049  BP:  (!) 173/46  Pulse: 63 64  Resp: 15 (!) 24  Temp:    SpO2: 98% 96%    Intake/Output Summary (Last 24 hours) at 12/29/2020 1103 Last data filed at 12/29/2020 0508 Gross per 24 hour  Intake 480 ml  Output 400 ml  Net 80 ml   Filed Weights    12/18/20 0755 12/19/20 2056 12/29/20 0950  Weight: 113.4 kg 80.5 kg 80.5 kg   Body mass index is 27.8 kg/m.   Physical Exam:  General: Looks fairly comfortable.  On room air.  Alert and oriented.  Slight cognitive deficits but mostly able to interact and answer. Cardiovascular: S1-S2 normal.  Regular rate rhythm. Respiratory: Bilateral clear.  No added sounds. Gastrointestinal: Soft.  Nontender.  Bowel sound present. Ext: No edema.  Mild tenderness both knees but no swelling or effusion.    Data Review: I have personally reviewed the following laboratory data and studies,  CBC: Recent Labs  Lab 12/26/20 0309  WBC 17.0*  HGB 8.0*  HCT 24.7*  MCV 81.0  PLT 270*   Basic Metabolic Panel: Recent Labs  Lab 12/23/20 0509 12/24/20 0605 12/25/20 0906 12/26/20 0309 12/28/20 1233  NA 133* 136 135 134* 131*  K 3.3* 3.5 3.3* 3.7 4.1  CL 90* 94* 93* 94* 95*  CO2 29 30 31 29 25   GLUCOSE 218* 165* 260* 196* 238*  BUN 84* 91* 99* 90* 76*  CREATININE 4.86* 5.63* 6.12* 6.21* 6.50*  CALCIUM 7.8* 7.6* 7.3* 7.3* 7.8*  PHOS  --   --  6.4*  --   --    Liver Function Tests: Recent Labs  Lab 12/25/20 0906  ALBUMIN 2.3*   No results for input(s): LIPASE, AMYLASE in the last 168 hours. No results for input(s): AMMONIA in the last 168 hours. Cardiac Enzymes: No results for input(s): CKTOTAL, CKMB, CKMBINDEX, TROPONINI in the last 168 hours.  BNP (last 3 results) No results for input(s): BNP in the last 8760 hours.  ProBNP (last 3 results) No results for input(s): PROBNP in the last 8760 hours.  CBG: Recent Labs  Lab 12/28/20 0815 12/28/20 1143 12/28/20 1611 12/28/20 2045 12/29/20 0753  GLUCAP 231* 218* 206* 289* 156*   Recent Results (from the past 240 hour(s))  CSF culture w Gram Stain     Status: None   Collection Time: 12/20/20  9:45 AM   Specimen: PATH Cytology CSF; Cerebrospinal Fluid  Result Value Ref Range Status   Specimen Description   Final     CSF  Performed at [facility], [address], [place], [place] [address]    Special Requests   Final    NONE Performed at [facility], [address] [place], [place] [address]    Gram Stain   Final    WBC SEEN NO RBC SEEN NO ORGANISMS SEEN Performed at [facility], [address], [place], [place] [address]    Culture   Final    NO GROWTH 3 DAYS Performed at [facility], [place] [address], [place], [place] [address]    Report Status 12/23/2020 FINAL  Final     Studies: PERIPHERAL VASCULAR CATHETERIZATION  Result Date: 12/29/2020 See surgical note for result.   Total time spent: 25 minutes  Barb Merino, MD  [facility] 12/29/2020  If 7PM-7AM, please contact night-coverage

## 2020-12-29 NOTE — TOC Progression Note (Signed)
Transition of Care [facility]) - Progression Note    Patient Details  Name: Jennifer Carrillo MRN: [id number] Date of Birth: 12/27/1933  Transition of Care [facility]) CM/SW Contact  Beverly Sessions, RN Phone Number: 12/29/2020, 10:14 AM  Clinical Narrative:     Plan now for perm cath and initiation of HD instead of PD. Elvera Bicker HD coordinator aware   Expected Discharge Plan: [place] Barriers to Discharge: Continued Medical Work up  Expected Discharge Plan and Services Expected Discharge Plan: [place]   Discharge Planning Services: CM Consult Post Acute Care Choice: [place] arrangements for the past 2 months: Single Family Home                 DME Arranged: N/A DME Agency: NA                   Social Determinants of Health (SDOH) Interventions    Readmission Risk Interventions Readmission Risk Prevention Plan 12/19/2020  Transportation Screening Complete  Medication Review Press photographer) Complete  PCP or Specialist appointment within 3-5 days of discharge Complete  HRI or Home Care Consult Complete  SW Recovery Care/Counseling Consult Complete  Palliative Care Screening Not [place] Complete  Some recent data might be hidden

## 2020-12-29 NOTE — Consult Note (Signed)
[facility] Consult Note  MRN : [id number]  Jennifer Carrillo is a 87 y.o. (05-Oct-1933) female who presents with chief complaint of  Chief Complaint  Patient presents with   Knee Pain   Leg Pain  .  History of Present Illness: We are asked by Colon Flattery and the nephrology staff to place a PermCath on the patient today.  She has had chronic kidney disease and has progressed to end-stage renal failure.  She is ultimately going to do peritoneal dialysis and this is going to be placed later on this admission, but she needs a PermCath for immediate dialysis access.  Her dialysis treatments will start with hemodialysis first.  She has some confusion.  She has some shortness of breath with any activity.  She also has some swelling and fluid retention.  No fevers or chills or signs of systemic infection  Current Facility-Administered Medications  Medication Dose Route Frequency Provider Last Rate Last Admin   0.9 %  sodium chloride infusion   Intravenous Continuous Algernon Huxley, MD 20 mL/hr at 12/29/20 0951 New Bag at 12/29/20 0951   [MAR Hold] acetaminophen (TYLENOL) tablet 650 mg  650 mg Oral Q6H PRN Athena Masse, MD   650 mg at 12/21/20 0527   Or   [MAR Hold] acetaminophen (TYLENOL) suppository 650 mg  650 mg Rectal Q6H PRN Athena Masse, MD       [MAR Hold] alum & mag hydroxide-simeth (MAALOX/MYLANTA) 200-200-20 MG/5ML suspension 30 mL  30 mL Oral Q6H PRN Barb Merino, MD   30 mL at 12/27/20 2235   [MAR Hold] amLODipine (NORVASC) tablet 10 mg  10 mg Oral Daily Athena Masse, MD   10 mg at 12/29/20 0904   [MAR Hold] anastrozole (ARIMIDEX) tablet 1 mg  1 mg Oral QPC supper Pokhrel, Laxman, MD   1 mg at 12/28/20 1830   [MAR Hold] atorvastatin (LIPITOR) tablet 40 mg  40 mg Oral Daily Barb Merino, MD   40 mg at 12/28/20 1123   ceFAZolin (ANCEF) 1-4 GM/50ML-% IVPB            ceFAZolin (ANCEF) IVPB 1 g/50 mL premix  1 g Intravenous 30 min Pre-Op Algernon Huxley, MD       [facility] Hold] docusate sodium (COLACE) capsule 100 mg  100 mg Oral BID Sharion Settler, NP   100 mg at 12/28/20 2055   [facility] Hold] docusate sodium (COLACE) capsule 200 mg  200 mg Oral QHS PRN Pokhrel, Laxman, MD   200 mg at 12/22/20 2128   [MAR Hold] donepezil (ARICEPT) tablet 10 mg  10 mg Oral QHS Pokhrel, Laxman, MD   10 mg at 12/28/20 2056   fentaNYL (SUBLIMAZE) 50 MCG/ML injection            [MAR Hold] heparin injection 5,000 Units  5,000 Units Subcutaneous Q8H Barb Merino, MD   5,000 Units at 12/29/20 0533   [facility] Hold] hydrALAZINE (APRESOLINE) injection 10 mg  10 mg Intravenous Q4H PRN Athena Masse, MD   10 mg at 12/29/20 0904   [facility] Hold] hydrALAZINE (APRESOLINE) tablet 100 mg  100 mg Oral Q8H Pokhrel, Laxman, MD   100 mg at 12/29/20 0532   [MAR Hold] insulin aspart (novoLOG) injection 0-5 Units  0-5 Units Subcutaneous QHS Barb Merino, MD   3 Units at 12/28/20 2057   [facility] Hold] insulin aspart (novoLOG) injection 0-9 Units  0-9 Units Subcutaneous TID WC Barb Merino, MD  2 Units at 12/29/20 0844   [MAR Hold] insulin glargine-yfgn (SEMGLEE) injection 8 Units  8 Units Subcutaneous QHS Nicole Kindred A, DO   8 Units at 12/28/20 2057   [facility] Hold] isosorbide mononitrate (IMDUR) 24 hr tablet 60 mg  60 mg Oral Daily Pokhrel, Laxman, MD   60 mg at 12/29/20 0904   [MAR Hold] labetalol (NORMODYNE) injection 20 mg  20 mg Intravenous Q2H PRN Pokhrel, Laxman, MD       midazolam (VERSED) 2 MG/2ML injection            [MAR Hold] mometasone-formoterol (DULERA) 100-5 MCG/ACT inhaler 2 puff  2 puff Inhalation BID Pokhrel, Laxman, MD   2 puff at 12/29/20 0853   [MAR Hold] ondansetron (ZOFRAN) tablet 4 mg  4 mg Oral Q6H PRN Athena Masse, MD       Or   [facility] Hold] ondansetron [facility]) injection 4 mg  4 mg Intravenous Q6H PRN Athena Masse, MD       [MAR Hold] senna (SENOKOT) tablet 8.6 mg  1 tablet Oral Daily Sharion Settler, NP   8.6 mg at 12/28/20 1123   [MAR Hold] traMADol  (ULTRAM) tablet 50 mg  50 mg Oral Q12H PRN Barb Merino, MD   50 mg at 12/27/20 2214    Past Medical History:  Diagnosis Date   Arthritis    Asthma    Calf pain    Cancer ([place]) 2016   left breast   COPD (chronic obstructive pulmonary disease) (HCC)    Diabetes (HCC)    Discoid lupus    Gastro-esophageal reflux    Glaucoma    Hyperlipemia    Hypertension    Iron deficiency anemia    Joint pain    Leg swelling    Osteoarthritis    PVD (peripheral vascular disease) ([place])    Sinus problem    Sleep apnea    No CPAP/ Can't tolerate   Stroke ([place]) 1987    Past Surgical History:  Procedure Laterality Date   BREAST SURGERY Left    left lumpectomy   CAROTID ANGIOGRAPHY Left 06/25/2018   Procedure: CAROTID ANGIOGRAPHY, possible intervention;  Surgeon: Katha Cabal, MD;  Location: [place] [facility];  Service: Cardiovascular;  Laterality: Left;   CAROTID ENDARTERECTOMY Right    CAROTID PTA/STENT INTERVENTION Left 06/25/2018   Procedure: CAROTID PTA/STENT INTERVENTION;  Surgeon: Katha Cabal, MD;  Location: [place] [facility];  Service: Cardiovascular;  Laterality: Left;   CATARACT EXTRACTION Right 2013   CORONARY STENT PLACEMENT     L. L. E.   EYE SURGERY Bilateral    cataract   LEFT HEART CATH AND CORONARY ANGIOGRAPHY Left 05/06/2018   Procedure: LEFT HEART CATH AND CORONARY ANGIOGRAPHY;  Surgeon: Corey Skains, MD;  Location: [place] [facility];  Service: Cardiovascular;  Laterality: Left;   LOWER EXTREMITY ANGIOGRAPHY Right 01/22/2018   Procedure: LOWER EXTREMITY ANGIOGRAPHY;  Surgeon: Katha Cabal, MD;  Location: [place] [facility];  Service: Cardiovascular;  Laterality: Right;   RENAL ANGIOGRAPHY Left 02/26/2018   Procedure: RENAL ANGIOGRAPHY;  Surgeon: Katha Cabal, MD;  Location: [place] [facility];  Service: Cardiovascular;  Laterality: Left;   STENT PLACEMENT VASCULAR ([facility] HX) Left    stent placement on LLE   TUBAL LIGATION       Social History Social History   Tobacco Use   Smoking status: Former    Types: Cigarettes    Quit date: 1987  Years since quitting: 35.9   Smokeless tobacco: Never   Tobacco comments:    quit 31 years  Vaping Use   Vaping Use: Never used  Substance Use Topics   Alcohol use: No   Drug use: No    Family History  Problem Relation Age of Onset   Heart disease Mother    Diabetes Other    Hypertension Other    Diabetes Sister    Lung cancer Brother    Leukemia Daughter    Bladder Cancer Neg Hx    Kidney disease Neg Hx    Prostate cancer Neg Hx     Allergies  Allergen Reactions   Benazepril Nausea Only     REVIEW OF SYSTEMS (Negative unless checked)  Constitutional: [] Weight loss  [] Fever  [] Chills Cardiac: [] Chest pain   [] Chest pressure   [] Palpitations   [] Shortness of breath when laying flat   [] Shortness of breath at rest   [x] Shortness of breath with exertion. Vascular:  [] Pain in legs with walking   [] Pain in legs at rest   [] Pain in legs when laying flat   [] Claudication   [] Pain in feet when walking  [] Pain in feet at rest  [] Pain in feet when laying flat   [] History of DVT   [] Phlebitis   [x] Swelling in legs   [] Varicose veins   [] Non-healing ulcers Pulmonary:   [] Uses home oxygen   [] Productive cough   [] Hemoptysis   [] Wheeze  [x] COPD   [] Asthma Neurologic:  [] Dizziness  [] Blackouts   [] Seizures   [x] History of stroke   [] History of TIA  [] Aphasia   [] Temporary blindness   [] Dysphagia   [] Weakness or numbness in arms   [] Weakness or numbness in legs Musculoskeletal:  [x] Arthritis   [] Joint swelling   [x] Joint pain   [] Low back pain Hematologic:  [] Easy bruising  [] Easy bleeding   [] Hypercoagulable state   [x] Anemic  [] Hepatitis Gastrointestinal:  [] Blood in stool   [] Vomiting blood  [x] Gastroesophageal reflux/heartburn   [] Difficulty swallowing. Genitourinary:  [x] Chronic kidney disease   [] Difficult urination  [] Frequent urination  [] Burning with urination    [] Blood in urine Skin:  [] Rashes   [] Ulcers   [] Wounds Psychological:  [] History of anxiety   []  History of major depression.  Physical Examination  Vitals:   12/28/20 2016 12/29/20 0540 12/29/20 0751 12/29/20 0950  BP: (!) 168/45 (!) 199/54 (!) 189/56 (!) 180/49  Pulse: 74 62 78 67  Resp: 20 20 18 17   Temp: 99.1 F (37.3 C) 97.6 F (36.4 C) 98.5 F (36.9 C) 98.5 F (36.9 C)  TempSrc: Oral Oral  Oral  SpO2: 98% 100% 97% 99%  Weight:    80.5 kg  Height:    5\' 7"  (1.702 m)   Body mass index is 27.8 kg/m. Gen:  WD/WN, NAD Head: /AT, No temporalis wasting.  Ear/Nose/Throat: Hearing grossly intact, nares w/o erythema or drainage, oropharynx w/o Erythema/Exudate Eyes: Sclera non-icteric, conjunctiva clear Neck: Trachea midline.  No JVD.  Pulmonary:  Good air movement, respirations not labored, equal bilaterally.  Cardiac: RRR, normal S1, S2. Vascular:  Vessel Right Left  Radial Palpable Palpable                                    Musculoskeletal: M/S 5/5 throughout.  Extremities without ischemic changes.  No deformity or atrophy. Mild BLE edema Neurologic: Sensation grossly intact in extremities.  Symmetrical.  Speech is  fluent. Motor exam as listed above. Psychiatric: Judgment intact, Mood & affect appropriate for pt's clinical situation. Dermatologic: No rashes or ulcers noted.  No cellulitis or open wounds.      CBC Lab Results  Component Value Date   WBC 17.0 (H) 12/26/2020   HGB 8.0 (L) 12/26/2020   HCT 24.7 (L) 12/26/2020   MCV 81.0 12/26/2020   PLT 424 (H) 12/26/2020    BMET    Component Value Date/Time   NA 131 (L) 12/28/2020 1233   NA 142 08/12/2020 1032   NA 142 05/20/2013 0509   K 4.1 12/28/2020 1233   K 3.7 05/20/2013 0509   CL 95 (L) 12/28/2020 1233   CL 108 (H) 05/20/2013 0509   CO2 25 12/28/2020 1233   CO2 30 05/20/2013 0509   GLUCOSE 238 (H) 12/28/2020 1233   GLUCOSE 108 (H) 05/20/2013 0509   BUN 76 (H) 12/28/2020 1233   BUN 37  (H) 08/12/2020 1032   BUN 13 05/20/2013 0509   CREATININE 6.50 (H) 12/28/2020 1233   CREATININE 0.92 05/20/2013 0509   CALCIUM 7.8 (L) 12/28/2020 1233   CALCIUM 8.9 05/20/2013 0509   GFRNONAA 6 (L) 12/28/2020 1233   GFRNONAA >60 05/20/2013 0509   GFRAA 31 (L) 02/13/2019 1045   GFRAA >60 05/20/2013 0509   Estimated Creatinine Clearance: 6.9 mL/min (A) (by C-G formula based on SCr of 6.5 mg/dL (H)).  COAG Lab Results  Component Value Date   INR 1.3 (H) 12/19/2020    Radiology DG Chest 1 View  Result Date: 12/18/2020 CLINICAL DATA:  Weakness, lower extremity pain EXAM: CHEST  1 VIEW COMPARISON:  11/26/2020 FINDINGS: Single frontal view of the chest demonstrates a stable cardiac silhouette. Stable atherosclerosis. No acute airspace disease, effusion, or pneumothorax. No acute bony abnormality. IMPRESSION: 1. No acute intrathoracic process. Electronically Signed   By: Randa Ngo M.D.   On: 12/18/2020 15:37   DG Lumbar Spine 2-3 Views  Result Date: 12/18/2020 CLINICAL DATA:  Weakness, pain EXAM: LUMBAR SPINE - 2-3 VIEW COMPARISON:  None. FINDINGS: Degenerative spurring throughout the lumbar spine. Mild disc space narrowing at L5-S1. Degenerative facet disease. Normal alignment. No fracture. Aortic atherosclerosis. Left renal artery and iliac artery stents bilaterally. IMPRESSION: Degenerative changes.  No acute bony abnormality. Electronically Signed   By: Rolm Baptise M.D.   On: 12/18/2020 15:38   DG Pelvis 1-2 Views  Result Date: 12/18/2020 CLINICAL DATA:  Pain, weakness EXAM: PELVIS - 1-2 VIEW COMPARISON:  None. FINDINGS: Early spurring in the hip joints bilaterally. No acute bony abnormality. Specifically, no fracture, subluxation, or dislocation. IMPRESSION: No acute bony abnormality. Electronically Signed   By: Rolm Baptise M.D.   On: 12/18/2020 15:37   DG Knee 1-2 Views Left  Result Date: 12/21/2020 CLINICAL DATA:  Bilateral knee pain. EXAM: LEFT KNEE - 1-2 VIEW COMPARISON:   None. FINDINGS: No fracture or dislocation is noted. Small suprapatellar joint effusion is noted. Moderate narrowing of the medial and lateral joint spaces are noted. IMPRESSION: Moderate degenerative joint disease. Small suprapatellar joint effusion. No fracture or dislocation is noted. Electronically Signed   By: Marijo Conception M.D.   On: 12/21/2020 15:41   DG Knee 1-2 Views Right  Result Date: 12/21/2020 CLINICAL DATA:  Bilateral knee pain. EXAM: RIGHT KNEE - 1-2 VIEW COMPARISON:  None. FINDINGS: No fracture or dislocation is noted. Moderate size suprapatellar joint effusion is noted. Severe narrowing of lateral joint space is noted. Mild degenerative changes noted medially. IMPRESSION:  Moderate size suprapatellar joint effusion. Severe degenerative joint disease is noted laterally. No fracture or dislocation is noted. Electronically Signed   By: Marijo Conception M.D.   On: 12/21/2020 15:42   MR LUMBAR SPINE WO CONTRAST  Result Date: 12/18/2020 CLINICAL DATA:  Lumbar radiculopathy. Rule out infection. Low back pain EXAM: MRI LUMBAR SPINE WITHOUT CONTRAST TECHNIQUE: Multiplanar, multisequence MR imaging of the lumbar spine was performed. No intravenous contrast was administered. COMPARISON:  Lumbar spine radiographs 12/18/2020 FINDINGS: Segmentation:  5 lumbar segments as noted on x-ray. Alignment: Slight anterolisthesis L4-5. Otherwise normal alignment. Vertebrae: Negative for fracture or mass. No evidence of spinal infection. Conus medullaris and cauda equina: Conus extends to the L1-2 level. Conus and cauda equina appear normal. Paraspinal and other soft tissues: Negative for paraspinous mass or adenopathy. Disc levels: L1-2: Negative L2-3: Mild disc bulging.  Negative for stenosis L3-4: Diffuse bulging of the disc. Small right foraminal disc protrusion. Bilateral facet degeneration. Mild subarticular stenosis bilaterally. Spinal canal adequate in size L4-5: Mild anterolisthesis. Diffuse disc bulging  and bilateral facet degeneration. Mild spinal stenosis. Neural foramina patent bilaterally. L5-S1: Disc degeneration with diffuse endplate spurring. Moderate subarticular stenosis bilaterally. IMPRESSION: 1. Mild lumbar degenerative change.  Mild spinal stenosis as above 2. No evidence of spinal infection or fracture. Electronically Signed   By: Franchot Gallo M.D.   On: 12/18/2020 20:06   US RENAL  Result Date: 12/22/2020 CLINICAL DATA:  Acute kidney injury, history diabetes mellitus, COPD, discoid lupus, hypertension, coronary artery disease, former smoker, breast cancer EXAM: RENAL / URINARY TRACT ULTRASOUND COMPLETE COMPARISON:  None FINDINGS: Right Kidney: Renal measurements: 10.1 x 5.0 x 5.4 cm = volume: 140 mL. Normal cortical thickness. Slightly increased cortical echogenicity. Tiny cyst at upper pole 13 x 12 x 12 mm. Small cyst mid kidney 18 x 17 x 16 mm. Tiny cyst mid kidney 11 mm diameter. No solid mass or hydronephrosis. No shadowing calcifications. Left Kidney: Renal measurements: 10.1 x 5.6 x 5.3 cm = volume: 153 mL. Normal cortical thickness. Increased cortical echogenicity. No mass, hydronephrosis, or shadowing calcification. Bladder: Appears normal for degree of bladder distention. LEFT ureteral jet was visualized. Other: N/A IMPRESSION: Medical renal disease changes of both kidneys. Small RIGHT renal cysts. Electronically Signed   By: Lavonia Dana M.D.   On: 12/22/2020 17:20   [place] UE VEIN MAPPING LEFT (PRE-OP AVF)  Result Date: 12/22/2020 CLINICAL DATA:  Preop evaluation prior to AV fistula creation EXAM: [place] EXTREM UP VEIN MAPPING COMPARISON:  None. FINDINGS: LEFT ARTERIES Wrist Radial Artery: Size 54mm Waveform monophasic Wrist Ulnar Artery: Size 22mm Waveform monophasic Prox. Forearm Radial Artery: Size 35mm Waveform triphasic Upper Arm Brachial Artery: Size 70mm Waveform triphasic LEFT VEINS Forearm Cephalic Vein: Prox 67mm Distal 103mm Depth 4-8NI Upper Arm Cephalic Vein: Prox thrombosed  distal thrombosed depth 62VO Upper Arm Basilic Vein: Prox 62mm Distal 9mm Depth 14-4mm Upper Arm Brachial Vein: Prox 2mm Distal 52mm Depth 14-39mm ADDITIONAL LEFT VEINS Axillary Vein:  43mm Subclavian Vein: Patient: Yes Respiratory Phasicity: Present Internal Jugular Vein: Patent: Yes    Respiratory Phasicity: Present IMPRESSION: 1. Monophasic flow seen in the radial and ulnar arteries at the level of the wrist. 2. Upper arm cephalic vein is thrombosed. Electronically Signed   By: Miachel Roux M.D.   On: 12/22/2020 16:26   DG FL GUIDED LUMBAR PUNCTURE  Result Date: 12/20/2020 CLINICAL DATA:  Patient complains of back pain with concern for infection request received for diagnostic lumbar puncture. EXAM:  DIAGNOSTIC LUMBAR PUNCTURE UNDER FLUOROSCOPIC GUIDANCE COMPARISON:  MR lumbar spine 12/18/2020, lumbar spine x-rays 12/18/2020 reviewed prior to the procedure. FLUOROSCOPY TIME:  Fluoroscopy Time:  0.2 minute Radiation Exposure Index (if provided by the fluoroscopic device): 6.60 mGy Number of Acquired Spot Images: 2 PROCEDURE: Informed consent was obtained from the patient's legal guardian prior to the procedure, including potential complications of bleeding, infection, paresthesias, nerve damage, CSF leak requiring additional procedures, post procedure requirement to lay flat for several hours after the procedure, headache, allergy, and pain. With the patient prone, the lower back was prepped with Betadine. 1% Lidocaine was used for local anesthesia. Lumbar puncture was performed at the L4-L5 level using a 20 gauge needle with return of colorless CSF with an opening pressure of 17 cm water. 10 ml of CSF were obtained for laboratory studies. The inner stylet was placed back into the needle and the needle was removed in its entirety. The patient tolerated the procedure well and there were no apparent complications. A sterile bandage was applied. IMPRESSION: Technically successful fluoroscopic guided lumbar  puncture. This exam was performed by Tsosie Billing PA-C, and was supervised and interpreted by Dr. Posey Pronto. Electronically Signed   By: Kathreen Devoid M.D.   On: 12/20/2020 10:25      Assessment/Plan 1.  Renal failure.  She is going to have a peritoneal dialysis catheter placed later in the week, but she needs a PermCath for immediate hemodialysis access which is what she will start her therapy with.  This will be placed today.  Risks and benefits were discussed with the patient and she is agreeable to proceed.  Family was also alerted to the care plan and consent was obtained from them through nursing staff. 2.  Diabetes.  Likely an underlying cause of her renal failure and blood glucose control important in reducing the progression of atherosclerotic disease. Also, involved in wound healing. On appropriate medications. 3. Hypertension. Likely an underlying cause of her renal failure and blood pressure control important in reducing the progression of atherosclerotic disease. On appropriate oral medications.    Leotis Pain, MD  12/29/2020 10:17 AM    This note was created with Dragon medical transcription system.  Any error is purely unintentional

## 2020-12-29 NOTE — Progress Notes (Signed)
[facility]  ROUNDING NOTE   Subjective:   Jennifer Carrillo is a 87 y.o. female with past medical concerns including hypertension, diabetes, CAD, shingles, breast CA, and chronic kidney disease stage 4. She presented to the ed with pain extending from her back to her legs and was not able to get out of bed. Patient was admitted for Bilateral leg pain [M79.604, M79.605] Sepsis ([place]) [A41.9] Acute bilateral low back pain, unspecified whether sciatica present [M54.50] Sepsis without acute organ dysfunction, due to unspecified organism [facility]) [A41.9]  Patient is currently known to our office and is followed by Dr Candiss Norse. She was last seen in the office on 12/12/20.   Update:  Patient seen resting quietly after Permcath placement Alert and pleasantly confused  Vital signs in last 24 hours:  Temp:  [97.6 F (36.4 C)-99.1 F (37.3 C)] 98.5 F (36.9 C) (12/22 1245) Pulse Rate:  [61-78] 64 (12/22 1415) Resp:  [12-24] 13 (12/22 1415) BP: (156-201)/(42-96) 172/65 (12/22 1400) SpO2:  [94 %-100 %] 94 % (12/22 1415) Weight:  [80.5 kg-83.4 kg] 83.4 kg (12/22 1245)  Weight change:  Filed Weights   12/19/20 2056 12/29/20 0950 12/29/20 1245  Weight: 80.5 kg 80.5 kg 83.4 kg    Intake/Output: I/O last 3 completed shifts: In: 600 [P.O.:600] Out: 1050 [Urine:1050]   Intake/Output this shift:  No intake/output data recorded.  Physical Exam: General: NAD, laying in bed  Head: Normocephalic, atraumatic. Moist oral mucosal membranes  Eyes: Anicteric  Lungs:  Clear to auscultation, normal effort  Heart: Regular rate and rhythm  Abdomen:  Soft, nontender, nondistended  Extremities:  no peripheral edema.  Neurologic: Nonfocal, moving all four extremities  Skin: No lesions  Access Rt Permcath placed on 12/29/20 by Dr Lucky Cowboy    Basic Metabolic Panel: Recent Labs  Lab 12/23/20 0509 12/24/20 0605 12/25/20 0906 12/26/20 0309 12/28/20 1233  NA 133* 136 135 134* 131*  K 3.3* 3.5  3.3* 3.7 4.1  CL 90* 94* 93* 94* 95*  CO2 29 30 31 29 25   GLUCOSE 218* 165* 260* 196* 238*  BUN 84* 91* 99* 90* 76*  CREATININE 4.86* 5.63* 6.12* 6.21* 6.50*  CALCIUM 7.8* 7.6* 7.3* 7.3* 7.8*  PHOS  --   --  6.4*  --   --      Liver Function Tests: Recent Labs  Lab 12/25/20 0906  ALBUMIN 2.3*    No results for input(s): LIPASE, AMYLASE in the last 168 hours. No results for input(s): AMMONIA in the last 168 hours.  CBC: Recent Labs  Lab 12/26/20 0309  WBC 17.0*  HGB 8.0*  HCT 24.7*  MCV 81.0  PLT 424*     Cardiac Enzymes: No results for input(s): CKTOTAL, CKMB, CKMBINDEX, TROPONINI in the last 168 hours.   BNP: Invalid input(s): POCBNP  CBG: Recent Labs  Lab 12/28/20 1143 12/28/20 1611 12/28/20 2045 12/29/20 0753 12/29/20 1115  GLUCAP 218* 206* 289* 156* 126*     Microbiology: Results for orders placed or performed during the hospital encounter of 12/18/20  Resp Panel by RT-PCR (Flu A&B, Covid) Nasopharyngeal Swab     Status: None   Collection Time: 12/18/20  5:19 PM   Specimen: Nasopharyngeal Swab; Nasopharyngeal(NP) swabs in vial transport medium  Result Value Ref Range Status   SARS Coronavirus 2 by RT PCR NEGATIVE NEGATIVE Final    Comment: (NOTE) SARS-CoV-2 target nucleic acids are NOT DETECTED.  The SARS-CoV-2 RNA is generally detectable in upper respiratory specimens during the acute  phase of infection. The lowest concentration of SARS-CoV-2 viral copies this assay can detect is 138 copies/mL. A negative result does not preclude SARS-Cov-2 infection and should not be used as the sole basis for treatment or other patient management decisions. A negative result may occur with  improper specimen collection/handling, submission of specimen other than nasopharyngeal swab, presence of viral mutation(s) within the areas targeted by this assay, and inadequate number of viral copies(<138 copies/mL). A negative result must be combined with clinical  observations, patient history, and epidemiological information. The expected result is Negative.  Fact Sheet for Patients:  EntrepreneurPulse.com.au  Fact Sheet for Healthcare Providers:  IncredibleEmployment.be  This test is no t yet approved or cleared by the [place] FDA and  has been authorized for detection and/or diagnosis of SARS-CoV-2 by FDA under an Emergency Use Authorization (EUA). This EUA will remain  in effect (meaning this test can be used) for the duration of the COVID-19 declaration under Section 564(b)(1) of the Act, 21 U.S.C.section 360bbb-3(b)(1), unless the authorization is terminated  or revoked sooner.       Influenza A by PCR NEGATIVE NEGATIVE Final   Influenza B by PCR NEGATIVE NEGATIVE Final    Comment: (NOTE) The Xpert Xpress SARS-CoV-2/FLU/RSV plus assay is intended as an aid in the diagnosis of influenza from Nasopharyngeal swab specimens and should not be used as a sole basis for treatment. Nasal washings and aspirates are unacceptable for Xpert Xpress SARS-CoV-2/FLU/RSV testing.  Fact Sheet for Patients: EntrepreneurPulse.com.au  Fact Sheet for Healthcare Providers: IncredibleEmployment.be  This test is not yet approved or cleared by the [place] FDA and has been authorized for detection and/or diagnosis of SARS-CoV-2 by FDA under an Emergency Use Authorization (EUA). This EUA will remain in effect (meaning this test can be used) for the duration of the COVID-19 declaration under Section 564(b)(1) of the Act, 21 U.S.C. section 360bbb-3(b)(1), unless the authorization is terminated or revoked.  Performed at [facility], [address], [place], [place] [address]   Urine Culture     Status: Abnormal   Collection Time: 12/18/20  5:30 PM   Specimen: Urine, Clean Catch  Result Value Ref Range Status   Specimen Description   Final    URINE, CLEAN  CATCH Performed at [facility], [address], [place], [place] [address]    Special Requests   Final    NONE Performed at [facility], [address] [place], [place] [address]    Culture (A)  Final    <10,000 COLONIES/mL INSIGNIFICANT GROWTH Performed at [facility] [address], [place], [place] [address]    Report Status 12/19/2020 FINAL  Final  Blood culture (routine x 2)     Status: None   Collection Time: 12/18/20 10:30 PM   Specimen: BLOOD  Result Value Ref Range Status   Specimen Description BLOOD BLOOD RIGHT HAND  Final   Special Requests   Final    BOTTLES DRAWN AEROBIC AND ANAEROBIC Blood Culture adequate volume   Culture   Final    NO GROWTH 10 DAYS Performed at [facility], [address], [place], [place] [address]    Report Status 12/28/2020 FINAL  Final  Blood culture (routine x 2)     Status: None   Collection Time: 12/18/20 10:35 PM   Specimen: BLOOD  Result Value Ref Range Status   Specimen Description BLOOD BLOOD LEFT HAND  Final   Special Requests   Final  BOTTLES DRAWN AEROBIC AND ANAEROBIC Blood Culture adequate volume   Culture   Final    NO GROWTH 10 DAYS Performed at [facility], [address] [place], [place] [address]    Report Status 12/28/2020 FINAL  Final  VZV PCR, CSF     Status: None   Collection Time: 12/19/20 10:52 AM   Specimen: Cerebrospinal Fluid  Result Value Ref Range Status   VZV PCR, CSF Negative Negative Final    Comment: (NOTE) No Varicella Zoster Virus DNA detected. Performed At: [facility] [place], [place] [id number] Rush Farmer MD DX:[id number]   CSF culture w Gram Stain     Status: None   Collection Time: 12/20/20  9:45 AM   Specimen: PATH Cytology CSF; Cerebrospinal Fluid  Result Value Ref Range Status   Specimen Description   Final    CSF Performed at [facility], [address], [place], [place] [address]     Special Requests   Final    NONE Performed at [facility], [place] [place], [place] [address]    Gram Stain   Final    WBC SEEN NO RBC SEEN NO ORGANISMS SEEN Performed at [facility], [address], [place], [place] [address]    Culture   Final    NO GROWTH 3 DAYS Performed at [facility], [place] [address] [place],  [address]    Report Status 12/23/2020 FINAL  Final    Coagulation Studies: No results for input(s): LABPROT, INR in the last 72 hours.  Urinalysis: No results for input(s): COLORURINE, LABSPEC, PHURINE, GLUCOSEU, HGBUR, BILIRUBINUR, KETONESUR, PROTEINUR, UROBILINOGEN, NITRITE, LEUKOCYTESUR in the last 72 hours.  Invalid input(s): APPERANCEUR    Imaging: PERIPHERAL VASCULAR CATHETERIZATION  Result Date: 12/29/2020 See surgical note for result.    Medications:    sodium chloride     sodium chloride      amLODipine  10 mg Oral Daily   anastrozole  1 mg Oral QPC supper   atorvastatin  40 mg Oral Daily   Chlorhexidine Gluconate Cloth  6 each Topical Q0600   docusate sodium  100 mg Oral BID   donepezil  10 mg Oral QHS   fentaNYL       heparin injection (subcutaneous)  5,000 Units Subcutaneous Q8H   hydrALAZINE  100 mg Oral Q8H   insulin aspart  0-5 Units Subcutaneous QHS   insulin aspart  0-9 Units Subcutaneous TID WC   insulin glargine-yfgn  8 Units Subcutaneous QHS   isosorbide mononitrate  60 mg Oral Daily   labetalol       midazolam       mometasone-formoterol  2 puff Inhalation BID   senna  1 tablet Oral Daily   sodium chloride, sodium chloride, acetaminophen **OR** acetaminophen, alteplase, alum & mag hydroxide-simeth, docusate sodium, heparin, hydrALAZINE, labetalol, lidocaine (PF), lidocaine-prilocaine, ondansetron **OR** ondansetron (ZOFRAN) IV, pentafluoroprop-tetrafluoroeth, traMADol  Assessment/ Plan:  Ms. Jennifer Carrillo is a 87 y.o.  female with past medical concerns including hypertension,  diabetes, CAD, shingles, breast CA, and chronic kidney disease stage 4. She presented to the ed with pain extending from her back to her legs and was not able to get out of bed. Patient was admitted for Bilateral leg pain [M79.604, M79.605] Sepsis ([place]) [A41.9] Acute bilateral low back pain, unspecified whether sciatica present [M54.50] Sepsis without acute organ dysfunction, due to unspecified organism ([place]) [A41.9]   End stage renal disease requiring dialysis.  Due to progression of disease and steady decline of renal function without sustainable recovery. We feel this has progressed to end stage renal disease.   -Renal function remains poor.  It is our recommendation to proceed with renal replacement therapy.  -Appreciate Vascular placing Rt Permcath  -Attempted first dialysis treatment after permcath placement. Unsuccessful treatment due to increased pressures, not uncommon with newly placed catheters and inflammation from procedure.  -Will attempt treatment tomorrow  -Dialysis coordinator aware of patient and outpatient placement in progress.   Lab Results  Component Value Date   CREATININE 6.50 (H) 12/28/2020   CREATININE 6.21 (H) 12/26/2020   CREATININE 6.12 (H) 12/25/2020    Intake/Output Summary (Last 24 hours) at 12/29/2020 1504 Last data filed at 12/29/2020 0508 Gross per 24 hour  Intake 240 ml  Output 400 ml  Net -160 ml    2. Anemia of chronic kidney disease Lab Results  Component Value Date   HGB 8.0 (L) 12/26/2020    Will order low dose EPO with dialysis tomorrow   3. Secondary Hyperparathyroidism: with outpatient labs:phosphorus 3.5, calcium 9.3 on 11/07/20.   Lab Results  Component Value Date   PTH 35 06/27/2018   CALCIUM 7.8 (L) 12/28/2020   PHOS 6.4 (H) 12/25/2020    Will continue to monitor bone minerals. Will correct slowly with dialysis.   4. Diabetes mellitus type II with chronic kidney disease insulin dependent. Home regimen includes NPH. Most  recent hemoglobin A1c is 5.3 on 12/18/20. Primary team to manage SSI.   5.  Hypertension with chronic kidney disease.  Home regimen includes hydrochlorothiazide, isosorbide, telmisartan, amlodipine, and hydralazine.  Telmisartan and HCTZ currently Carrillo.  - BP remains elevated.    LOS: [place] [address] [place] 12/22/20223:04 PM

## 2020-12-30 ENCOUNTER — Encounter
Admission: EM | Disposition: A | Discharge status: Home Health | Payer: Self-pay | Source: Home / Self Care | Attending: Internal Medicine

## 2020-12-30 LAB — RENAL FUNCTION PANEL
Albumin: 2.5 g/dL — ABNORMAL LOW (ref 3.5–5.0)
Anion gap: 8 (ref 5–15)
BUN: 47 mg/dL — ABNORMAL HIGH (ref 8–23)
CO2: 27 mmol/L (ref 22–32)
Calcium: 8.4 mg/dL — ABNORMAL LOW (ref 8.9–10.3)
Chloride: 102 mmol/L (ref 98–111)
Creatinine, Ser: 2.97 mg/dL — ABNORMAL HIGH (ref 0.44–1.00)
GFR, Estimated: 15 mL/min — ABNORMAL LOW (ref >60)
Glucose, Bld: 114 mg/dL — ABNORMAL HIGH (ref 70–99)
Phosphorus: 3.4 mg/dL (ref 2.5–4.6)
Potassium: 4.1 mmol/L (ref 3.5–5.1)
Sodium: 137 mmol/L (ref 135–145)

## 2020-12-30 LAB — GLUCOSE, CAPILLARY
Glucose-Capillary: 179 mg/dL — ABNORMAL HIGH (ref 70–99)
Glucose-Capillary: 117 mg/dL — ABNORMAL HIGH (ref 70–99)
Glucose-Capillary: 152 mg/dL — ABNORMAL HIGH (ref 70–99)
Glucose-Capillary: 254 mg/dL — ABNORMAL HIGH (ref 70–99)

## 2020-12-30 LAB — CBC
HCT: 29.7 % — ABNORMAL LOW (ref 36.0–46.0)
Hemoglobin: 9.3 g/dL — ABNORMAL LOW (ref 12.0–15.0)
MCH: 26.6 pg (ref 26.0–34.0)
MCHC: 31.3 g/dL (ref 30.0–36.0)
MCV: 85.1 fL (ref 80.0–100.0)
Platelets: 401 10*3/uL — ABNORMAL HIGH (ref 150–400)
RBC: 3.49 MIL/uL — ABNORMAL LOW (ref 3.87–5.11)
RDW: 18.6 % — ABNORMAL HIGH (ref 11.5–15.5)
WBC: 13.8 10*3/uL — ABNORMAL HIGH (ref 4.0–10.5)
nRBC: 0 % (ref 0.0–0.2)

## 2020-12-30 SURGERY — LAPAROSCOPIC INSERTION CONTINUOUS AMBULATORY PERITONEAL DIALYSIS  (CAPD) CATHETER
Anesthesia: Choice

## 2020-12-30 MED ORDER — EPOETIN ALFA 10000 UNIT/ML IJ SOLN
4000.0000 [IU] | INTRAMUSCULAR | Status: DC
  Administered 2020-12-31: 4000 [IU] via SUBCUTANEOUS

## 2020-12-30 MED ORDER — HEPARIN SODIUM (PORCINE) 1000 UNIT/ML IJ SOLN
INTRAMUSCULAR | Status: AC
  Filled 2020-12-30: qty 10

## 2020-12-30 NOTE — Progress Notes (Signed)
Patient accepted at [facility], start date 12/27 at [place]. Family aware

## 2020-12-30 NOTE — Progress Notes (Signed)
[facility]  ROUNDING NOTE   Subjective:   Jennifer Carrillo is a 87 y.o. female with past medical concerns including hypertension, diabetes, CAD, shingles, breast CA, and chronic kidney disease stage 4. She presented to the ed with pain extending from her back to her legs and was not able to get out of bed. Patient was admitted for Bilateral leg pain [M79.604, M79.605] Sepsis ([place]) [A41.9] Acute bilateral low back pain, unspecified whether sciatica present [M54.50] Sepsis without acute organ dysfunction, due to unspecified organism [facility]) [A41.9]  Patient is currently known to our office and is followed by Dr Candiss Norse. She was last seen in the office on 12/12/20.   Update:  Patient seen and evaluated on dialysis   HEMODIALYSIS FLOWSHEET:  Blood Flow Rate (mL/min): 250 mL/min Arterial Pressure (mmHg): -90 mmHg Venous Pressure (mmHg): 80 mmHg Transmembrane Pressure (mmHg): 40 mmHg Ultrafiltration Rate (mL/min): 400 mL/min Dialysate Flow Rate (mL/min): 300 ml/min Conductivity: Machine : 13.8 Conductivity: Machine : 13.8 Dialysis Fluid Bolus: Normal Saline Bolus Amount (mL): 200 mL  No complaints at this time Tolerating second treatment well  Vital signs in last 24 hours:  Temp:  [98 F (36.7 C)-98.7 F (37.1 C)] 98 F (36.7 C) (12/23 0809) Pulse Rate:  [51-72] 54 (12/23 1145) Resp:  [13-22] 18 (12/23 1145) BP: (155-211)/(40-115) 204/41 (12/23 1145) SpO2:  [94 %-100 %] 98 % (12/23 1130) Weight:  [83.4 kg-85.9 kg] 85.9 kg (12/23 0915)  Weight change:  Filed Weights   12/29/20 0950 12/29/20 1245 12/30/20 0915  Weight: 80.5 kg 83.4 kg 85.9 kg    Intake/Output: I/O last 3 completed shifts: In: 380 [P.O.:330; IV Piggyback:50] Out: 622 [Urine:620; Other:11]   Intake/Output this shift:  No intake/output data recorded.  Physical Exam: General: NAD, laying in bed  Head: Normocephalic, atraumatic. Moist oral mucosal membranes  Eyes: Anicteric  Lungs:  Clear to  auscultation, normal effort  Heart: Regular rate and rhythm  Abdomen:  Soft, nontender, nondistended  Extremities:  no peripheral edema.  Neurologic: Nonfocal, moving all four extremities  Skin: No lesions  Access Rt Permcath placed on 12/29/20 by Dr Lucky Cowboy    Basic Metabolic Panel: Recent Labs  Lab 12/24/20 0605 12/25/20 0906 12/26/20 0309 12/28/20 1233  NA 136 135 134* 131*  K 3.5 3.3* 3.7 4.1  CL 94* 93* 94* 95*  CO2 30 31 29 25   GLUCOSE 165* 260* 196* 238*  BUN 91* 99* 90* 76*  CREATININE 5.63* 6.12* 6.21* 6.50*  CALCIUM 7.6* 7.3* 7.3* 7.8*  PHOS  --  6.4*  --   --      Liver Function Tests: Recent Labs  Lab 12/25/20 0906  ALBUMIN 2.3*    No results for input(s): LIPASE, AMYLASE in the last 168 hours. No results for input(s): AMMONIA in the last 168 hours.  CBC: Recent Labs  Lab 12/26/20 0309  WBC 17.0*  HGB 8.0*  HCT 24.7*  MCV 81.0  PLT 424*     Cardiac Enzymes: No results for input(s): CKTOTAL, CKMB, CKMBINDEX, TROPONINI in the last 168 hours.   BNP: Invalid input(s): POCBNP  CBG: Recent Labs  Lab 12/29/20 0753 12/29/20 1115 12/29/20 1747 12/29/20 2038 12/30/20 0810  GLUCAP 156* 126* 223* 254* 179*     Microbiology: Results for orders placed or performed during the hospital encounter of 12/18/20  Resp Panel by RT-PCR (Flu A&B, Covid) Nasopharyngeal Swab     Status: None   Collection Time: 12/18/20  5:19 PM   Specimen: Nasopharyngeal  Swab; Nasopharyngeal(NP) swabs in vial transport medium  Result Value Ref Range Status   SARS Coronavirus 2 by RT PCR NEGATIVE NEGATIVE Final    Comment: (NOTE) SARS-CoV-2 target nucleic acids are NOT DETECTED.  The SARS-CoV-2 RNA is generally detectable in upper respiratory specimens during the acute phase of infection. The lowest concentration of SARS-CoV-2 viral copies this assay can detect is 138 copies/mL. A negative result does not preclude SARS-Cov-2 infection and should not be used as the sole  basis for treatment or other patient management decisions. A negative result may occur with  improper specimen collection/handling, submission of specimen other than nasopharyngeal swab, presence of viral mutation(s) within the areas targeted by this assay, and inadequate number of viral copies(<138 copies/mL). A negative result must be combined with clinical observations, patient history, and epidemiological information. The expected result is Negative.  Fact Sheet for Patients:  EntrepreneurPulse.com.au  Fact Sheet for Healthcare Providers:  IncredibleEmployment.be  This test is no t yet approved or cleared by the [place] FDA and  has been authorized for detection and/or diagnosis of SARS-CoV-2 by FDA under an Emergency Use Authorization (EUA). This EUA will remain  in effect (meaning this test can be used) for the duration of the COVID-19 declaration under Section 564(b)(1) of the Act, 21 U.S.C.section 360bbb-3(b)(1), unless the authorization is terminated  or revoked sooner.       Influenza A by PCR NEGATIVE NEGATIVE Final   Influenza B by PCR NEGATIVE NEGATIVE Final    Comment: (NOTE) The Xpert Xpress SARS-CoV-2/FLU/RSV plus assay is intended as an aid in the diagnosis of influenza from Nasopharyngeal swab specimens and should not be used as a sole basis for treatment. Nasal washings and aspirates are unacceptable for Xpert Xpress SARS-CoV-2/FLU/RSV testing.  Fact Sheet for Patients: EntrepreneurPulse.com.au  Fact Sheet for Healthcare Providers: IncredibleEmployment.be  This test is not yet approved or cleared by the [place] FDA and has been authorized for detection and/or diagnosis of SARS-CoV-2 by FDA under an Emergency Use Authorization (EUA). This EUA will remain in effect (meaning this test can be used) for the duration of the COVID-19 declaration under Section 564(b)(1) of the Act,  21 U.S.C. section 360bbb-3(b)(1), unless the authorization is terminated or revoked.  Performed at [facility], [address] [place], [place] [address]   Urine Culture     Status: Abnormal   Collection Time: 12/18/20  5:30 PM   Specimen: Urine, Clean Catch  Result Value Ref Range Status   Specimen Description   Final    URINE, CLEAN CATCH Performed at [facility], [address], [place], [place] [address]    Special Requests   Final    NONE Performed at [facility], [address] [place], [place] [address]    Culture (A)  Final    <10,000 COLONIES/mL INSIGNIFICANT GROWTH Performed at [facility] [address], [place], [place] [address]    Report Status 12/19/2020 FINAL  Final  Blood culture (routine x 2)     Status: None   Collection Time: 12/18/20 10:30 PM   Specimen: BLOOD  Result Value Ref Range Status   Specimen Description BLOOD BLOOD RIGHT HAND  Final   Special Requests   Final    BOTTLES DRAWN AEROBIC AND ANAEROBIC Blood Culture adequate volume   Culture   Final    NO GROWTH 10 DAYS Performed at [facility], [address] [place], [place] [address]    Report Status  12/28/2020 FINAL  Final  Blood culture (routine x 2)     Status: None   Collection Time: 12/18/20 10:35 PM   Specimen: BLOOD  Result Value Ref Range Status   Specimen Description BLOOD BLOOD LEFT HAND  Final   Special Requests   Final    BOTTLES DRAWN AEROBIC AND ANAEROBIC Blood Culture adequate volume   Culture   Final    NO GROWTH 10 DAYS Performed at [facility], [address] [place], [place] [address]    Report Status 12/28/2020 FINAL  Final  VZV PCR, CSF     Status: None   Collection Time: 12/19/20 10:52 AM   Specimen: Cerebrospinal Fluid  Result Value Ref Range Status   VZV PCR, CSF Negative Negative Final    Comment: (NOTE) No Varicella Zoster Virus DNA detected. Performed At: [facility] [place], [place] [id number] Rush Farmer MD HR:[id number]   CSF culture w Gram Stain     Status: None   Collection Time: 12/20/20  9:45 AM   Specimen: PATH Cytology CSF; Cerebrospinal Fluid  Result Value Ref Range Status   Specimen Description   Final    CSF Performed at [facility], [address], [place], [place] [address]    Special Requests   Final    NONE Performed at [facility], [place] [place], [place] [address]    Gram Stain   Final    WBC SEEN NO RBC SEEN NO ORGANISMS SEEN Performed at [facility], [address] [place], [place] [address]    Culture   Final    NO GROWTH 3 DAYS Performed at [facility], [place] [address] [place],  [address]    Report Status 12/23/2020 FINAL  Final    Coagulation Studies: No results for input(s): LABPROT, INR in the last 72 hours.  Urinalysis: No results for input(s): COLORURINE, LABSPEC, PHURINE, GLUCOSEU, HGBUR, BILIRUBINUR, KETONESUR, PROTEINUR, UROBILINOGEN, NITRITE, LEUKOCYTESUR in the last 72 hours.  Invalid input(s): APPERANCEUR    Imaging: PERIPHERAL VASCULAR CATHETERIZATION  Result Date: 12/29/2020 See surgical note for result.    Medications:      amLODipine  10 mg Oral Daily   anastrozole  1 mg Oral QPC supper   atorvastatin  40 mg Oral Daily   Chlorhexidine Gluconate Cloth  6 each Topical Q0600   docusate sodium  100 mg Oral BID   donepezil  10 mg Oral QHS   heparin injection (subcutaneous)  5,000 Units Subcutaneous Q8H   heparin sodium (porcine)       hydrALAZINE  100 mg Oral Q8H   insulin aspart  0-5 Units Subcutaneous QHS   insulin aspart  0-9 Units Subcutaneous TID WC   insulin glargine-yfgn  8 Units Subcutaneous QHS   isosorbide mononitrate  60 mg Oral Daily   mometasone-formoterol  2 puff Inhalation BID   senna  1 tablet Oral Daily   acetaminophen **OR** acetaminophen, alum & mag hydroxide-simeth, docusate sodium, hydrALAZINE,  labetalol, ondansetron **OR** ondansetron (ZOFRAN) IV, traMADol  Assessment/ Plan:  Ms. Jennifer Carrillo is a 87 y.o.  female with past medical concerns including hypertension, diabetes, CAD, shingles, breast CA, and chronic kidney disease stage 4. She presented to the ed with pain extending from her back to her legs and was not able to get out of bed. Patient was admitted for Bilateral leg pain [M79.604, M79.605] Sepsis ([place]) [A41.9] Acute bilateral low back pain, unspecified whether sciatica present [  M54.50] Sepsis without acute organ dysfunction, due to unspecified organism ([place]) [A41.9]   End stage renal disease requiring dialysis.   Due to progression of disease and steady decline of renal function without sustainable recovery. We feel this has progressed to end stage renal disease.   -Renal function remains poor.  It is our recommendation to proceed with renal replacement therapy.  -Appreciate Vascular placing Rt Permcath. Dialysis initiated on 12/29/20. Permcath positional during initial treatment, but patient was able to receive complete treatment.   - Receiving second treatment today, tolerating well. Managed with heparin to prevent thrombus  -Plan for third treatment tomorrow. Preferably in chair to prepart for outpatient.   -Dialysis coordinator aware of patient and outpatient placement in progress. Awaiting confirmation from [facility].   Lab Results  Component Value Date   CREATININE 6.50 (H) 12/28/2020   CREATININE 6.21 (H) 12/26/2020   CREATININE 6.12 (H) 12/25/2020    Intake/Output Summary (Last 24 hours) at 12/30/2020 1223 Last data filed at 12/30/2020 0604 Gross per 24 hour  Intake 140 ml  Output 231 ml  Net -91 ml    2. Anemia of chronic kidney disease Lab Results  Component Value Date   HGB 8.0 (L) 12/26/2020    Low dose EPO with dialysis tomorrow   3. Secondary Hyperparathyroidism: with outpatient labs:phosphorus 3.5, calcium 9.3 on 11/07/20.    Lab Results  Component Value Date   PTH 35 06/27/2018   CALCIUM 7.8 (L) 12/28/2020   PHOS 6.4 (H) 12/25/2020    Will obtain updated labs in am.    4. Diabetes mellitus type II with chronic kidney disease insulin dependent. Home regimen includes NPH. Most recent hemoglobin A1c is 5.3 on 12/18/20. Primary team to manage SSI.   5.  Hypertension with chronic kidney disease.  Home regimen includes hydrochlorothiazide, isosorbide, telmisartan, amlodipine, and hydralazine.  Telmisartan and HCTZ currently held.  - BP 205/47.    LOS: 12   12/23/202212:23 PM

## 2020-12-30 NOTE — Care Management Important Message (Signed)
Important Message  Patient Details  Name: Jennifer Carrillo MRN: [id number] Date of Birth: November 03, 1933   Medicare Important Message Given:  Yes     [name] A Kenzie Flakes 12/30/2020, 11:19 AM

## 2020-12-30 NOTE — Progress Notes (Signed)
12/29/20 1400 12/29/20 1415 12/29/20 1430  Vitals  Temp Source  --   --   --   BP (!) 172/65  --  (!) 183/52  MAP (mmHg) 97  --  88  BP Location  --   --   --   BP Method  --   --   --   Patient Position (if appropriate)  --   --   --   Pulse Rate 66 64 67  Pulse Rate Source  --   --   --   ECG Heart Rate 71 67 66  Resp (!) 22 13 17   During Hemodialysis Assessment  Blood Flow Rate (mL/min) 400 mL/min 400 mL/min 400 mL/min  Arterial Pressure (mmHg) -70 mmHg -70 mmHg -80 mmHg  Venous Pressure (mmHg) 110 mmHg 10 mmHg 240 mmHg  Transmembrane Pressure (mmHg) 30 mmHg 30 mmHg 80 mmHg  Ultrafiltration Rate (mL/min) 250 mL/min 250 mL/min 250 mL/min  Dialysate Flow Rate (mL/min)  --  300 ml/min 300 ml/min  Conductivity: Machine  13.9 13.9 13.9  HD Safety Checks Performed Yes Yes Yes  Dialysis Fluid Bolus Normal Saline  --   --   Bolus Amount (mL) 200 mL  --   --   Intra-Hemodialysis Comments Tx initiated (Treatment start at 1356) Progressing as prescribed Progressing as prescribed    12/29/20 1445 12/29/20 1500 12/29/20 1515  Vitals  Temp Source  --   --   --   BP (!) 170/52 (!) 175/46 (!) 178/43  MAP (mmHg) 86 84 82  BP Location  --   --   --   BP Method  --   --   --   Patient Position (if appropriate)  --   --   --   Pulse Rate 66 63 63  Pulse Rate Source  --   --   --   ECG Heart Rate 68 64 62  Resp 15 15 13   During Hemodialysis Assessment  Blood Flow Rate (mL/min) 400 mL/min 400 mL/min 400 mL/min  Arterial Pressure (mmHg) -80 mmHg -80 mmHg -80 mmHg  Venous Pressure (mmHg) 240 mmHg 240 mmHg 240 mmHg  Transmembrane Pressure (mmHg) 80 mmHg 80 mmHg 80 mmHg  Ultrafiltration Rate (mL/min) 250 mL/min 250 mL/min 250 mL/min  Dialysate Flow Rate (mL/min) 300 ml/min 300 ml/min 300 ml/min  Conductivity: Machine  13.9 13.9 13.9  HD Safety Checks Performed Yes Yes Yes  Dialysis Fluid Bolus  --   --   --   Bolus Amount (mL)  --   --   --   Intra-Hemodialysis Comments Progressing as  prescribed Progressing as prescribed Progressing as prescribed    12/29/20 1530 12/29/20 1545 12/29/20 1600  Vitals  Temp Source  --   --   --   BP (!) 181/46 (!) 158/115 (!) 166/42  MAP (mmHg) 84 126 77  BP Location  --   --   --   BP Method  --   --   --   Patient Position (if appropriate)  --   --   --   Pulse Rate 62 63 63  Pulse Rate Source  --   --   --   ECG Heart Rate 64 88 64  Resp (!) 21 20 16   During Hemodialysis Assessment  Blood Flow Rate (mL/min) 400 mL/min 400 mL/min 400 mL/min  Arterial Pressure (mmHg) -80 mmHg -80 mmHg -80 mmHg  Venous Pressure (mmHg) 240 mmHg 240 mmHg 240 mmHg  Transmembrane Pressure (  mmHg) 80 mmHg 80 mmHg 80 mmHg  Ultrafiltration Rate (mL/min) 250 mL/min 250 mL/min 250 mL/min  Dialysate Flow Rate (mL/min) 300 ml/min 300 ml/min 300 ml/min  Conductivity: Machine  13.9 13.9 13.9  HD Safety Checks Performed Yes Yes Yes  Dialysis Fluid Bolus  --   --   --   Bolus Amount (mL)  --   --   --   Intra-Hemodialysis Comments Progressing as prescribed Progressing as prescribed Progressing as prescribed    12/29/20 1615 12/29/20 1630 12/29/20 1745  Vitals  Temp Source  --   --   --   BP (!) 171/46 (!) 160/50 (!) 177/48  MAP (mmHg) 83 83 85  BP Location  --   --  Left Arm  BP Method  --   --  Automatic  Patient Position (if appropriate)  --   --  Lying  Pulse Rate (!) 59 68 68  Pulse Rate Source  --   --  Monitor  ECG Heart Rate 60 65  --   Resp 20 16 18   During Hemodialysis Assessment  Blood Flow Rate (mL/min) 400 mL/min  --   --   Arterial Pressure (mmHg) -80 mmHg  --   --   Venous Pressure (mmHg) 240 mmHg  --   --   Transmembrane Pressure (mmHg) 80 mmHg  --   --   Ultrafiltration Rate (mL/min) 250 mL/min  --   --   Dialysate Flow Rate (mL/min) 300 ml/min  --   --   Conductivity: Machine  13.9  --   --   HD Safety Checks Performed Yes  --   --   Dialysis Fluid Bolus  --   --   --   Bolus Amount (mL)  --   --   --   Intra-Hemodialysis Comments  Progressing as prescribed Tx completed  --     12/29/20 2032 12/30/20 0311 12/30/20 0559  Vitals  Temp Source Oral  --   --   BP (!) 155/52 (!) 167/44 (!) 157/41  MAP (mmHg) 82 81  --   BP Location Left Arm Left Arm  --   BP Method Automatic Automatic  --   Patient Position (if appropriate) Lying Lying  --   Pulse Rate 70 (!) 59  --   Pulse Rate Source Monitor Monitor  --   ECG Heart Rate  --   --   --   Resp (!) 22 (!) 22  --   During Hemodialysis Assessment  Blood Flow Rate (mL/min)  --   --   --   Arterial Pressure (mmHg)  --   --   --   Venous Pressure (mmHg)  --   --   --   Transmembrane Pressure (mmHg)  --   --   --   Ultrafiltration Rate (mL/min)  --   --   --   Dialysate Flow Rate (mL/min)  --   --   --   Conductivity: Machine   --   --   --   HD Safety Checks Performed  --   --   --   Dialysis Fluid Bolus  --   --   --   Bolus Amount (mL)  --   --   --   Intra-Hemodialysis Comments  --   --   --     12/30/20 0809  Vitals  Temp Source Oral  BP (!) 182/51 (RN Susana notified)  MAP (mmHg) 89  BP  Location Left Arm  BP Method Automatic  Patient Position (if appropriate) Lying  Pulse Rate 72  Pulse Rate Source Monitor  ECG Heart Rate  --   Resp 16  During Hemodialysis Assessment  Blood Flow Rate (mL/min)  --   Arterial Pressure (mmHg)  --   Venous Pressure (mmHg)  --   Transmembrane Pressure (mmHg)  --   Ultrafiltration Rate (mL/min)  --   Dialysate Flow Rate (mL/min)  --   Conductivity: Machine   --   HD Safety Checks Performed  --   Dialysis Fluid Bolus  --   Bolus Amount (mL)  --   Intra-Hemodialysis Comments  --

## 2020-12-30 NOTE — Progress Notes (Signed)
Physical Therapy Treatment Patient Details Name: Jennifer Carrillo MRN: [id number] DOB: Jun 05, 1933 Today's Date: 12/30/2020   History of Present Illness Jennifer Carrillo is an 55yoF who comes [facility] on [facility] from home with weakness adn back/leg pain. Pt admitted with sepsis of unknown source. PMH: DM, HTN, CKD4, CAD, Shingles on 12/05/20 s/p valtrex, CA breast on anastrozole, hyperlipidemia, OSA, discoid lupus. Etiology appears non-infectious per ID, now consulting rheumatology.    PT Comments    Pt seen this pm, grand daughter at bedside. Discussed POC, pt awaiting dialysis to begin this week prior to d/c. Pt agreed to PT, could not recall if she had been out of bed this date.  MinA for supine to sit. Good unsupported sitting balance at edge of bed. Sit to stand with MaxA to raise from bed, MinA to attain upright balance at RW. Pt able to advance a few steps to bedside chair with assist and guidance for safe sitting in chair. Pt participated in seated B LE strengthening exercises with good tolerance. Continue PT per POC, continue to recommend SNF once medically stable.  Recommendations for follow up therapy are one component of a multi-disciplinary discharge planning process, led by the attending physician.  Recommendations may be updated based on patient status, additional functional criteria and insurance authorization.  Follow Up Recommendations  Skilled nursing-short term rehab (<3 hours/day)     Assistance Recommended at Discharge Set up [place] Recommendations  None recommended by PT    Recommendations for Other Services       Precautions / Restrictions Precautions Precautions: Fall     Mobility  Bed Mobility Overal bed mobility: Needs Assistance Bed Mobility: Sit to Supine Rolling: Min assist;Min guard         General bed mobility comments:  (Pt required use of side rail)    Transfers Overall transfer level: Needs assistance Equipment used:  Rolling walker (2 wheels) Transfers: Sit to/from Stand Sit to Stand: Mod assist;Max assist                Ambulation/Gait Ambulation/Gait assistance: Min assist   Assistive device: Rolling walker (2 wheels) Gait Pattern/deviations: Step-to pattern Gait velocity:  (decreased)     General Gait Details:  (MinA for balance and attaining midline, as well as assist to manage placement of RW)   Stairs             Wheelchair Mobility    Modified Rankin (Stroke Patients Only)       Balance                                            Cognition Arousal/Alertness: Awake/alert Behavior During Therapy: WFL for tasks assessed/performed Overall Cognitive Status: History of cognitive impairments - at baseline                                 General Comments:  (Pt couldn't remember if she sat up in the chair today or yesterday.)        Exercises General Exercises - Lower Extremity Ankle Circles/Pumps: AROM;Both;10 reps Long Arc Quad: AROM;Both;10 reps Hip Flexion/Marching: AROM;Both;10 reps;Seated    General Comments        Pertinent Vitals/Pain      Home Living  Prior Function            PT Goals (current goals can now be found in the care plan section) Acute Rehab PT Goals Patient Stated Goal:  (Go Home)    Frequency    Min 2X/week      PT Plan Current plan remains appropriate    Co-evaluation              AM-PAC PT "6 Clicks" Mobility   Outcome Measure  Help needed turning from your back to your side while in a flat bed without using bedrails?: A Little Help needed moving from lying on your back to sitting on the side of a flat bed without using bedrails?: A Little Help needed moving to and from a bed to a chair (including a wheelchair)?: A Little Help needed standing up from a chair using your arms (e.g., wheelchair or bedside chair)?: A Lot Help needed to walk in  hospital room?: A Little Help needed climbing 3-5 steps with a railing? : Total 6 Click Score: 15    End of Session Equipment Utilized During Treatment: Gait belt Activity Tolerance: Patient tolerated treatment well;No increased pain Patient left: in chair;with call bell/phone within reach;with chair alarm set;with family/visitor present Nurse Communication: Mobility status PT Visit Diagnosis: Unsteadiness on feet (R26.81);Difficulty in walking, not elsewhere classified (R26.2);Other abnormalities of gait and mobility (R26.89);Muscle weakness (generalized) (M62.81)     Time:  -     Charges:                       Mikel Cella, PTA   Josie Dixon 12/30/2020, 10:39 AM

## 2020-12-30 NOTE — Progress Notes (Signed)
PROGRESS NOTE    Jennifer Carrillo  DUK:[id number] DOB: December 25, 1933 DOA: 12/18/2020 PCP: [name], MD    Brief Narrative:  87 y.o. female with past medical history of diabetes mellitus type 2, hypertension, CKD stage IV, coronary artery disease, history of shingles diagnosed on 11/28 treated with Valtrex and gabapentin presented to hospital with 1 day history of diffuse pain extending from her lower back to her legs to the level where she was unable to get out of the bed.  In the emergency room, she was febrile.  Blood pressures were elevated.  Creatinine 4.1.  Baseline creatinine about 3.8-4.  Admitted with broad-spectrum antibiotics.  Remains in the hospital due to worsening renal functions.  12/22, permacath and Initiating hemodialysis.   Assessment & Plan:   Principal Problem:   Sepsis ([place]) Active Problems:   PAD (peripheral artery disease) ([place])   Coronary artery disease involving native coronary artery of native heart   Anemia of chronic kidney failure, stage 4 (severe) (HCC)   Acute kidney injury superimposed on CKD IV (HCC)   Metabolic acidosis   Rhabdomyolysis   Hypertensive emergency without congestive heart failure   Hyperglycemia due to type 2 diabetes mellitus ([place])   Shingles 12/05/2020   Severe sepsis present on admission with altered mental status, acute kidney injury  Multiple investigations.  Negative for bacterial infection.   Found to have polyarthritis, seen by rheumatology.  Treated with a steroid injections and oral steroids with improvement.  Currently off antibiotics.  Using low-dose tramadol for pain relief.  Altered mental status, acute metabolic encephalopathy due to above.  Improved.  Acidemia, acute kidney injury superimposed on chronic kidney disease stage IV with metabolic acidosis: Continued progressive azotemia.  Started on hemodialysis 12/22, 12/23.  Next dialysis tomorrow.  Outpatient dialysis in process.  Type 2 diabetes with  hyperglycemia: Blood sugars fairly stable on home dose of insulin.  Coronary artery disease: Patient was on digoxin, Ranexa, statin and Plavix. Going to dialysis, digoxin discontinued.  Ranexa discontinued with worsening renal failure. Will resume Plavix and a statin.  History of breast cancer: On Arimidex from home.  Deconditioning and debility: Initially worked up for SNF.  Now patient's family wants to take her home.  May potentially go home with home health PT OT once outpatient dialysis is set up.  Disposition Plan: Status is: Inpatient  Remains inpatient appropriate because: Ongoing hemodialysis.  Outpatient dialysis need.         Consultants:  Nephrology Infectious disease Rheumatology  Procedures:  Hemodialysis Lumbar puncture  Antimicrobials:  Multiple.  Completed.   Subjective: Patient seen and examined.  She was receiving hemodialysis.  Slightly sleepy.  Denies any complaints.  Poor historian.  Objective: Vitals:   12/30/20 1130 12/30/20 1145 12/30/20 1200 12/30/20 1215  BP: (!) 211/50 (!) 204/41 (!) 194/48 (!) 205/47  Pulse: (!) 54 (!) 54 (!) 52 (!) 57  Resp: 19 18 18 16   Temp:    97.7 F (36.5 C)  TempSrc:    Oral  SpO2: 98%  99% 98%  Weight:    84.9 kg  Height:        Intake/Output Summary (Last 24 hours) at 12/30/2020 1250 Last data filed at 12/30/2020 1200 Gross per 24 hour  Intake 140 ml  Output 732 ml  Net -592 ml   Filed Weights   12/29/20 1245 12/30/20 0915 12/30/20 1215  Weight: 83.4 kg 85.9 kg 84.9 kg    Examination:  General exam: Appears calm and  comfortable, on room air.  Sleepy. Respiratory system: Clear to auscultation. Respiratory effort normal.  No added sounds. Cardiovascular system: S1 & S2 heard, RRR.  No edema. Gastrointestinal system: Soft.  Nontender.  Bowel sound present. Central nervous system: Alert and oriented. No focal neurological deficits.  Mild cognitive deficits.  Moves all extremities. Right IJ  permacath and running dialysis.    Data Reviewed: I have personally reviewed following labs and imaging studies  CBC: Recent Labs  Lab 12/26/20 0309  WBC 17.0*  HGB 8.0*  HCT 24.7*  MCV 81.0  PLT 789*   Basic Metabolic Panel: Recent Labs  Lab 12/24/20 0605 12/25/20 0906 12/26/20 0309 12/28/20 1233  NA 136 135 134* 131*  K 3.5 3.3* 3.7 4.1  CL 94* 93* 94* 95*  CO2 30 31 29 25   GLUCOSE 165* 260* 196* 238*  BUN 91* 99* 90* 76*  CREATININE 5.63* 6.12* 6.21* 6.50*  CALCIUM 7.6* 7.3* 7.3* 7.8*  PHOS  --  6.4*  --   --    GFR: Estimated Creatinine Clearance: 7.1 mL/min (A) (by C-G formula based on SCr of 6.5 mg/dL (H)). Liver Function Tests: Recent Labs  Lab 12/25/20 0906  ALBUMIN 2.3*   No results for input(s): LIPASE, AMYLASE in the last 168 hours. No results for input(s): AMMONIA in the last 168 hours. Coagulation Profile: No results for input(s): INR, PROTIME in the last 168 hours. Cardiac Enzymes: No results for input(s): CKTOTAL, CKMB, CKMBINDEX, TROPONINI in the last 168 hours. BNP (last 3 results) No results for input(s): PROBNP in the last 8760 hours. HbA1C: No results for input(s): HGBA1C in the last 72 hours. CBG: Recent Labs  Lab 12/29/20 0753 12/29/20 1115 12/29/20 1747 12/29/20 2038 12/30/20 0810  GLUCAP 156* 126* 223* 254* 179*   Lipid Profile: No results for input(s): CHOL, HDL, LDLCALC, TRIG, CHOLHDL, LDLDIRECT in the last 72 hours. Thyroid Function Tests: No results for input(s): TSH, T4TOTAL, FREET4, T3FREE, THYROIDAB in the last 72 hours. Anemia Panel: No results for input(s): VITAMINB12, FOLATE, FERRITIN, TIBC, IRON, RETICCTPCT in the last 72 hours. Sepsis Labs: No results for input(s): PROCALCITON, LATICACIDVEN in the last 168 hours.  No results found for this or any previous visit (from the past 240 hour(s)).       Radiology Studies: PERIPHERAL VASCULAR CATHETERIZATION  Result Date: 12/29/2020 See surgical note for  result.       Scheduled Meds:  amLODipine  10 mg Oral Daily   anastrozole  1 mg Oral QPC supper   atorvastatin  40 mg Oral Daily   Chlorhexidine Gluconate Cloth  6 each Topical Q0600   docusate sodium  100 mg Oral BID   donepezil  10 mg Oral QHS   [START ON 12/31/2020] epoetin (EPOGEN/PROCRIT) injection  4,000 Units Subcutaneous Q T,Th,Sa-HD   heparin injection (subcutaneous)  5,000 Units Subcutaneous Q8H   heparin sodium (porcine)       hydrALAZINE  100 mg Oral Q8H   insulin aspart  0-5 Units Subcutaneous QHS   insulin aspart  0-9 Units Subcutaneous TID WC   insulin glargine-yfgn  8 Units Subcutaneous QHS   isosorbide mononitrate  60 mg Oral Daily   mometasone-formoterol  2 puff Inhalation BID   senna  1 tablet Oral Daily   Continuous Infusions:   LOS: 12 days    Time spent: 25 minutes    Barb Merino, MD [facility] Pager [phone]

## 2020-12-30 NOTE — Progress Notes (Signed)
Patient completes second day of HD, as a new start. Tolerated well, without issue, achieved BFR and targeted UF. Dressing changed, no med's given. Patient scheduled for 3rd treatment on 12/31/20 will have treatment in dialysis chair. Report given to primary nurse, patient returned to floor.

## 2020-12-30 NOTE — Progress Notes (Signed)
OT Cancellation Note  Patient Details Name: NANNETTE ZILL MRN: [id number] DOB: 1933/03/04   Cancelled Treatment:     Attempted to see patient this morning, nurse in the room and states pt is getting ready to go to dialysis and will be gone most of the day.  Will continue attempts for OT treatment.  Krue Peterka T Jessicah Croll, OTR/L, CLT  Authur Cubit 12/30/2020, 8:50 AM

## 2020-12-30 NOTE — Plan of Care (Signed)

## 2020-12-31 DIAGNOSIS — M79604 Pain in right leg: Secondary | ICD-10-CM

## 2020-12-31 DIAGNOSIS — M79605 Pain in left leg: Secondary | ICD-10-CM

## 2020-12-31 DIAGNOSIS — L89629 Pressure ulcer of left heel, unspecified stage: Secondary | ICD-10-CM | POA: Insufficient documentation

## 2020-12-31 DIAGNOSIS — N186 End stage renal disease: Secondary | ICD-10-CM

## 2020-12-31 DIAGNOSIS — L8990 Pressure ulcer of unspecified site, unspecified stage: Secondary | ICD-10-CM | POA: Insufficient documentation

## 2020-12-31 LAB — GLUCOSE, CAPILLARY
Glucose-Capillary: 118 mg/dL — ABNORMAL HIGH (ref 70–99)
Glucose-Capillary: 169 mg/dL — ABNORMAL HIGH (ref 70–99)
Glucose-Capillary: 295 mg/dL — ABNORMAL HIGH (ref 70–99)

## 2020-12-31 MED ORDER — HEPARIN SODIUM (PORCINE) 1000 UNIT/ML IJ SOLN
INTRAMUSCULAR | Status: AC
  Filled 2020-12-31: qty 10

## 2020-12-31 MED ORDER — ACETAMINOPHEN 325 MG PO TABS
ORAL_TABLET | ORAL | Status: AC
  Filled 2020-12-31: qty 1

## 2020-12-31 MED ORDER — ACETAMINOPHEN 325 MG PO TABS
ORAL_TABLET | ORAL | Status: AC
  Filled 2020-12-31: qty 2

## 2020-12-31 MED ORDER — INSULIN NPH ISOPHANE & REGULAR (70-30) 100 UNIT/ML ~~LOC~~ SUSP: 12.0000 [IU] | Freq: Two times a day (BID) | SUBCUTANEOUS | 11 refills | Status: DC

## 2020-12-31 MED ORDER — ACETAMINOPHEN 500 MG PO TABS: 500.0000 mg | ORAL_TABLET | Freq: Four times a day (QID) | ORAL | 0 refills | Status: AC | PRN

## 2020-12-31 MED ORDER — EPOETIN ALFA 4000 UNIT/ML IJ SOLN
INTRAMUSCULAR | Status: AC
  Filled 2020-12-31: qty 1

## 2020-12-31 NOTE — Progress Notes (Signed)
Patient completes 3-hour treatment without incident, although uncomfortable in bed.  RIJ CVC functions within parameters, meeting prescribed BFR. Targeted UF reached with 1.5-liter fluid removal. Patient given ordered dose of Epogen, additional Tylenol for discomfort. Report given to primary nurse, patient returned to assigned room.

## 2020-12-31 NOTE — Progress Notes (Signed)
PT Cancellation Note  Patient Details Name: Jennifer Carrillo MRN: [id number] DOB: Feb 06, 1933   Cancelled Treatment:    Reason Eval/Treat Not Completed: Other (comment).  Pt currently off floor at dialysis.  Will re-attempt PT session at a later date/time.  Leitha Bleak, PT 12/31/20, 1:19 PM

## 2020-12-31 NOTE — TOC Progression Note (Signed)
Transition of Care [facility]) - Progression Note    Patient Details  Name: Jennifer Carrillo MRN: [id number] Date of Birth: 1933/08/23  Transition of Care [facility]) CM/SW [place], LCSW Phone Number: 12/31/2020, 3:31 PM  Clinical Narrative:   Notified Cory with [facility] of possible DC home today.    Expected Discharge Plan: [place] Barriers to Discharge: Continued Medical Work up  Expected Discharge Plan and Services Expected Discharge Plan: [place]   Discharge Planning Services: CM Consult Post Acute Care Choice: [place] arrangements for the past 2 months: Single Family Home                 DME Arranged: N/A DME Agency: NA                   Social Determinants of Health (SDOH) Interventions    Readmission Risk Interventions Readmission Risk Prevention Plan 12/19/2020  Transportation Screening Complete  Medication Review Press photographer) Complete  PCP or Specialist appointment within 3-5 days of discharge Complete  HRI or Home Care Consult Complete  SW Recovery Care/Counseling Consult Complete  Palliative Care Screening Not [place] Complete  Some recent data might be hidden

## 2020-12-31 NOTE — Plan of Care (Signed)
°  Problem: Clinical Measurements: Goal: Ability to maintain clinical measurements within normal limits will improve Outcome: Progressing Goal: Respiratory complications will improve Outcome: Progressing Goal: Cardiovascular complication will be avoided Outcome: Progressing   Problem: Clinical Measurements: Goal: Respiratory complications will improve Outcome: Progressing   Problem: Clinical Measurements: Goal: Cardiovascular complication will be avoided Outcome: Progressing

## 2020-12-31 NOTE — Progress Notes (Signed)
[facility] [place] NAME: Jennifer Carrillo    MR#:  [id number]  DATE OF BIRTH:  11/17/1933  SUBJECTIVE:  Tolerated HD well No family at bedside No dizziness or sob  REVIEW OF SYSTEMS:   Review of Systems  Constitutional:  Negative for chills, fever and weight loss.  HENT:  Negative for ear discharge, ear pain and nosebleeds.   Eyes:  Negative for blurred vision, pain and discharge.  Respiratory:  Negative for sputum production, shortness of breath, wheezing and stridor.   Cardiovascular:  Negative for chest pain, palpitations, orthopnea and PND.  Gastrointestinal:  Negative for abdominal pain, diarrhea, nausea and vomiting.  Genitourinary:  Negative for frequency and urgency.  Musculoskeletal:  Negative for back pain and joint pain.  Neurological:  Positive for weakness. Negative for sensory change, speech change and focal weakness.  Psychiatric/Behavioral:  Negative for depression and hallucinations. The patient is not nervous/anxious.   Tolerating Diet:yes Tolerating PT: rec rehab but family prefers taking pt home  DRUG ALLERGIES:   Allergies  Allergen Reactions   Benazepril Nausea Only    VITALS:  Blood pressure (!) 154/40, pulse 72, temperature 98.7 F (37.1 C), resp. rate 20, height 5\' 7"  (1.702 m), weight 84.9 kg, SpO2 100 %.  PHYSICAL EXAMINATION:   Physical Exam  GENERAL:  87 y.o.-year-old patient lying in the bed with no acute distress. obese HEENT: Head atraumatic, normocephalic. Oropharynx and nasopharynx clear.  LUNGS: Normal breath sounds bilaterally, no wheezing, rales, rhonchi. No use of accessory muscles of respiration.  CARDIOVASCULAR: S1, S2 normal. No murmurs, rubs, or gallops. HD Cath+ ABDOMEN: Soft, nontender, nondistended. Bowel sounds present. No organomegaly or mass.  EXTREMITIES: No cyanosis, clubbing or edema b/l.    NEUROLOGIC: nonfocal PSYCHIATRIC:  patient is alert and awake SKIN: per  RN  LABORATORY PANEL:  CBC Recent Labs  Lab 12/30/20 1306  WBC 13.8*  HGB 9.3*  HCT 29.7*  PLT 401*    Chemistries  Recent Labs  Lab 12/30/20 1306  NA 137  K 4.1  CL 102  CO2 27  GLUCOSE 114*  BUN 47*  CREATININE 2.97*  CALCIUM 8.4*   Cardiac Enzymes No results for input(s): TROPONINI in the last 168 hours. RADIOLOGY:  No results found. ASSESSMENT AND PLAN:  87 y.o. female with past medical history of diabetes mellitus type 2, hypertension, CKD stage IV, coronary artery disease, history of shingles diagnosed on 11/28 treated with Valtrex and gabapentin presented to hospital with 1 day history of diffuse pain extending from her lower back to her legs to the level where she was unable to get out of the bed.   Severe sepsis present on admission with altered mental status, acute kidney injury  Multiple investigations.  Negative for bacterial infection.   --Found to have polyarthritis, seen by rheumatology.   --Treated with a steroid injections and oral steroids with improvement.   --Currently off antibiotics.  Using low-dose tramadol for pain relief. --sepsis resolved   Altered mental status, acute metabolic encephalopathy due to above.  Improved. --resumed aricept   Acidemia, acute kidney injury superimposed on chronic kidney disease stage IV with metabolic acidosis--now progressed to ESRD  --Continued progressive azotemia.  -- Started on hemodialysis 12/22, 12/23, 12/24  -- Outpatient dialysis has been set up for 12/27 at 6 am--dter aware   Type 2 diabetes with hyperglycemia: with CKD Stage IV -- Blood sugars fairly stable on home dose of insulin. --Insulin adjusted  Coronary artery disease: --Patient was on digoxin, Ranexa, statin and Plavix. --Going to dialysis, digoxin discontinued.   --Ranexa can be resumed -- resume Plavix and a statin.   History of breast cancer: On Arimidex from home.   Deconditioning and debility: Initially worked up for SNF.  Now  patient's family wants to take her home.    Anticipate go  home with home health PT OT once outpatient dialysis in am   Disposition Plan: Home with [facility]    Family communication :dter Cherylann Banas Consults :nephrology, vascular CODE STATUS: FULL DVT Prophylaxis :Heparin Level of care: Telemetry Medical Status is: Inpatient  Anticiapte d/c. 12/25        TOTAL TIME TAKING CARE OF THIS PATIENT: 25 minutes.  >50% time spent on counselling and coordination of care  Note: This dictation was prepared with Dragon dictation along with smaller phrase technology. Any transcriptional errors that result from this process are unintentional.  Fritzi Mandes M.D    [facility]   CC: Primary care physician; [name], MD Patient ID: Jennifer Carrillo, female   DOB: 13-May-1933, 87 y.o.   MRN: [id number]

## 2020-12-31 NOTE — Progress Notes (Signed)
[facility]  ROUNDING NOTE   Subjective:   Seen and examined on hemodialysis treatment. Tolerating treatment well. Third dialysis treatment. Laying in bed. UF of 1.5 liters.     HEMODIALYSIS FLOWSHEET:  Blood Flow Rate (mL/min): 300 mL/min Arterial Pressure (mmHg): -100 mmHg Venous Pressure (mmHg): 70 mmHg Transmembrane Pressure (mmHg): 60 mmHg Ultrafiltration Rate (mL/min): 670 mL/min Dialysate Flow Rate (mL/min): 500 ml/min Conductivity: Machine : 14.2 Conductivity: Machine : 14.2 Dialysis Fluid Bolus: Normal Saline Bolus Amount (mL): 200 mL    Objective:  Vital signs in last 24 hours:  Temp:  [97.6 F (36.4 C)-99 F (37.2 C)] 98.3 F (36.8 C) (12/24 0943) Pulse Rate:  [51-75] 75 (12/24 0811) Resp:  [15-24] 24 (12/24 1030) BP: (161-211)/(35-60) 162/42 (12/24 1030) SpO2:  [97 %-100 %] 100 % (12/24 0811) Weight:  [84.9 kg] 84.9 kg (12/23 1215)  Weight change: 5.4 kg Filed Weights   12/29/20 1245 12/30/20 0915 12/30/20 1215  Weight: 83.4 kg 85.9 kg 84.9 kg    Intake/Output: I/O last 3 completed shifts: In: 330 [P.O.:330] Out: 2021 [Urine:1520; Other:501]   Intake/Output this shift:  No intake/output data recorded.  Physical Exam: General: NAD, laying in bed  Head: Normocephalic, atraumatic. Moist oral mucosal membranes  Eyes: Anicteric, PERRL  Neck: Supple, trachea midline  Lungs:  Clear to auscultation  Heart: Regular rate and rhythm  Abdomen:  Soft, nontender,   Extremities:  no peripheral edema.  Neurologic: Nonfocal, moving all four extremities  Skin: No lesions  Access: RIJ permcath 12/29/20 Dr. Lucky Cowboy    Basic Metabolic Panel: Recent Labs  Lab 12/25/20 0906 12/26/20 0309 12/28/20 1233 12/30/20 1306  NA 135 134* 131* 137  K 3.3* 3.7 4.1 4.1  CL 93* 94* 95* 102  CO2 31 29 25 27   GLUCOSE 260* 196* 238* 114*  BUN 99* 90* 76* 47*  CREATININE 6.12* 6.21* 6.50* 2.97*  CALCIUM 7.3* 7.3* 7.8* 8.4*  PHOS 6.4*  --   --  3.4    Liver  Function Tests: Recent Labs  Lab 12/25/20 0906 12/30/20 1306  ALBUMIN 2.3* 2.5*   No results for input(s): LIPASE, AMYLASE in the last 168 hours. No results for input(s): AMMONIA in the last 168 hours.  CBC: Recent Labs  Lab 12/26/20 0309 12/30/20 1306  WBC 17.0* 13.8*  HGB 8.0* 9.3*  HCT 24.7* 29.7*  MCV 81.0 85.1  PLT 424* 401*    Cardiac Enzymes: No results for input(s): CKTOTAL, CKMB, CKMBINDEX, TROPONINI in the last 168 hours.  BNP: Invalid input(s): POCBNP  CBG: Recent Labs  Lab 12/30/20 0810 12/30/20 1305 12/30/20 1645 12/30/20 2026 12/31/20 0808  GLUCAP 179* 117* 152* 254* 118*    Microbiology: Results for orders placed or performed during the hospital encounter of 12/18/20  Resp Panel by RT-PCR (Flu A&B, Covid) Nasopharyngeal Swab     Status: None   Collection Time: 12/18/20  5:19 PM   Specimen: Nasopharyngeal Swab; Nasopharyngeal(NP) swabs in vial transport medium  Result Value Ref Range Status   SARS Coronavirus 2 by RT PCR NEGATIVE NEGATIVE Final    Comment: (NOTE) SARS-CoV-2 target nucleic acids are NOT DETECTED.  The SARS-CoV-2 RNA is generally detectable in upper respiratory specimens during the acute phase of infection. The lowest concentration of SARS-CoV-2 viral copies this assay can detect is 138 copies/mL. A negative result does not preclude SARS-Cov-2 infection and should not be used as the sole basis for treatment or other patient management decisions. A negative result may occur with  improper specimen collection/handling, submission of specimen other than nasopharyngeal swab, presence of viral mutation(s) within the areas targeted by this assay, and inadequate number of viral copies(<138 copies/mL). A negative result must be combined with clinical observations, patient history, and epidemiological information. The expected result is Negative.  Fact Sheet for Patients:  EntrepreneurPulse.com.au  Fact Sheet for  Healthcare Providers:  IncredibleEmployment.be  This test is no t yet approved or cleared by the [place] FDA and  has been authorized for detection and/or diagnosis of SARS-CoV-2 by FDA under an Emergency Use Authorization (EUA). This EUA will remain  in effect (meaning this test can be used) for the duration of the COVID-19 declaration under Section 564(b)(1) of the Act, 21 U.S.C.section 360bbb-3(b)(1), unless the authorization is terminated  or revoked sooner.       Influenza A by PCR NEGATIVE NEGATIVE Final   Influenza B by PCR NEGATIVE NEGATIVE Final    Comment: (NOTE) The Xpert Xpress SARS-CoV-2/FLU/RSV plus assay is intended as an aid in the diagnosis of influenza from Nasopharyngeal swab specimens and should not be used as a sole basis for treatment. Nasal washings and aspirates are unacceptable for Xpert Xpress SARS-CoV-2/FLU/RSV testing.  Fact Sheet for Patients: EntrepreneurPulse.com.au  Fact Sheet for Healthcare Providers: IncredibleEmployment.be  This test is not yet approved or cleared by the [place] FDA and has been authorized for detection and/or diagnosis of SARS-CoV-2 by FDA under an Emergency Use Authorization (EUA). This EUA will remain in effect (meaning this test can be used) for the duration of the COVID-19 declaration under Section 564(b)(1) of the Act, 21 U.S.C. section 360bbb-3(b)(1), unless the authorization is terminated or revoked.  Performed at [facility], [address], [place], [place] [address]   Urine Culture     Status: Abnormal   Collection Time: 12/18/20  5:30 PM   Specimen: Urine, Clean Catch  Result Value Ref Range Status   Specimen Description   Final    URINE, CLEAN CATCH Performed at [facility], [address], [place], [place] [address]    Special Requests   Final    NONE Performed at [facility], [address],  [place], [place] [address]    Culture (A)  Final    <10,000 COLONIES/mL INSIGNIFICANT GROWTH Performed at [facility] [address] [place], [place] [address]    Report Status 12/19/2020 FINAL  Final  Blood culture (routine x 2)     Status: None   Collection Time: 12/18/20 10:30 PM   Specimen: BLOOD  Result Value Ref Range Status   Specimen Description BLOOD BLOOD RIGHT HAND  Final   Special Requests   Final    BOTTLES DRAWN AEROBIC AND ANAEROBIC Blood Culture adequate volume   Culture   Final    NO GROWTH 10 DAYS Performed at [facility], [address], [place], [place] [address]    Report Status 12/28/2020 FINAL  Final  Blood culture (routine x 2)     Status: None   Collection Time: 12/18/20 10:35 PM   Specimen: BLOOD  Result Value Ref Range Status   Specimen Description BLOOD BLOOD LEFT HAND  Final   Special Requests   Final    BOTTLES DRAWN AEROBIC AND ANAEROBIC Blood Culture adequate volume   Culture   Final    NO GROWTH 10 DAYS Performed at [facility], [address], [place], [place] [address]    Report Status 12/28/2020 FINAL  Final  VZV  PCR, CSF     Status: None   Collection Time: 12/19/20 10:52 AM   Specimen: Cerebrospinal Fluid  Result Value Ref Range Status   VZV PCR, CSF Negative Negative Final    Comment: (NOTE) No Varicella Zoster Virus DNA detected. Performed At: [facility] [place], [place] [id number] Rush Farmer MD XQ:[id number]   CSF culture w Gram Stain     Status: None   Collection Time: 12/20/20  9:45 AM   Specimen: PATH Cytology CSF; Cerebrospinal Fluid  Result Value Ref Range Status   Specimen Description   Final    CSF Performed at [facility], [address], [place], [place] [address]    Special Requests   Final    NONE Performed at [facility], [address] [place], [place] [address]    Gram Stain   Final    WBC SEEN NO RBC SEEN NO ORGANISMS  SEEN Performed at [facility], [address] [place], [place] [address]    Culture   Final    NO GROWTH 3 DAYS Performed at [facility], [place] [address], [place], [place] [address]    Report Status 12/23/2020 FINAL  Final    Coagulation Studies: No results for input(s): LABPROT, INR in the last 72 hours.  Urinalysis: No results for input(s): COLORURINE, LABSPEC, PHURINE, GLUCOSEU, HGBUR, BILIRUBINUR, KETONESUR, PROTEINUR, UROBILINOGEN, NITRITE, LEUKOCYTESUR in the last 72 hours.  Invalid input(s): APPERANCEUR    Imaging: No results found.   Medications:     amLODipine  10 mg Oral Daily   anastrozole  1 mg Oral QPC supper   atorvastatin  40 mg Oral Daily   Chlorhexidine Gluconate Cloth  6 each Topical Q0600   docusate sodium  100 mg Oral BID   donepezil  10 mg Oral QHS   epoetin (EPOGEN/PROCRIT) injection  4,000 Units Subcutaneous Q T,Th,Sa-HD   heparin injection (subcutaneous)  5,000 Units Subcutaneous Q8H   hydrALAZINE  100 mg Oral Q8H   insulin aspart  0-5 Units Subcutaneous QHS   insulin aspart  0-9 Units Subcutaneous TID WC   insulin glargine-yfgn  8 Units Subcutaneous QHS   isosorbide mononitrate  60 mg Oral Daily   mometasone-formoterol  2 puff Inhalation BID   senna  1 tablet Oral Daily   acetaminophen **OR** acetaminophen, alum & mag hydroxide-simeth, docusate sodium, hydrALAZINE, labetalol, ondansetron **OR** ondansetron (ZOFRAN) IV, traMADol  Assessment/ Plan:  Ms. DORAL DIGANGI is a 87 y.o. black female with end stage renal disease, hypertension, diabetes mellitus type II, coronary artery disease, breast cancer who is admitted to [facility] on 12/18/2020 for Bilateral leg pain [M79.604, M79.605] Sepsis ([place]) [A41.9] Acute bilateral low back pain, unspecified whether sciatica present [M54.50] Sepsis without acute organ dysfunction, due to unspecified organism ([place]) [A41.9]  Initiated on hemodialysis on this admission.   End Stage renal  disease: seen and examined on hemodialysis treatment. Tolerating treatment well. Third dialysis treatment.  - outpatient planning for [facility]  Hypertension: 161/49. Home regimen of telmisartan. Started on amlodipine, hydralazine and isosorbide mononitrate - restart telmisartan   Diabetes mellitus type II with chronic kidney disease: insulin dependent. Hemoglobin A1c of 5.3%.   Anemia with chronic kidney disease: hemoglobin 9.3 - ESA with HD treatment.   Secondary Hyperparathyroidism: phosphorus now at goal. Not on binders. Not on a vitamin D agent.    LOS: 13 Jennifer Carrillo 12/24/202210:44 AM

## 2020-12-31 NOTE — Progress Notes (Signed)
Physical Therapy Treatment Patient Details Name: Jennifer Carrillo MRN: [id number] DOB: 14-Aug-1933 Today's Date: 12/31/2020   History of Present Illness Jennifer Carrillo is an 67yoF who comes to [facility] on 12/11 from home with weakness adn back/leg pain. Pt admitted with sepsis of unknown source. PMH: DM, HTN, CKD4, CAD, Shingles on 12/05/20 s/p valtrex, CA breast on anastrozole, hyperlipidemia, OSA, discoid lupus. Etiology appears non-infectious per ID, now consulting rheumatology. 12/19 nephrology reports pt has progressed from CKD to ESRD and now needs dialysis, pt converted to HD this admission.    PT Comments    Pt in bed upon arrival, awake, agreeable to session. Pt pleasant, very motivated. Pt reassessed from legs standpoint, much more ROM and strength, far less pain inhibition compared to 7 days prior. Pt able to comes from supine to sitting EOB without assist, moving much better compared to last week. Pt has good balance at EOB despite large BP drop in transition as well as lightheadedness. Pt still requires mod-maxA to rise to standing with RW (>50% physical assistance), and despite ability to achieve balance with RW, only tolerates  ~3 seconds before presyncope catalyzes a transition back to sitting EOB- here pt becomes less responsive briefly. Pt returned to supine, author obtains a dinamap for formal orthostatic vitals. Not able to collect standing value due to symptomatic orthostatic in supine to sitting. Pt assisted maxA sqaut pivot to recliner at EOS as lunch tray has just arrived and pt is ready to eat. BP taken after pivot transfer and 5 minutes later to assure stability and safety. Values are flat, symptoms improving. Pt eating lunch at exit.     Recommendations for follow up therapy are one component of a multi-disciplinary discharge planning process, led by the attending physician.  Recommendations may be updated based on patient status, additional functional criteria and insurance  authorization.  Follow Up Recommendations  Skilled nursing-short term rehab (<3 hours/day)     Assistance Recommended at Discharge Set up [place] Recommendations  None recommended by PT (extensive DME will be needed if home is DC destination.)    Recommendations for Other Services       Precautions / Restrictions Precautions Precautions: Fall Restrictions Weight Bearing Restrictions: No     Mobility  Bed Mobility Overal bed mobility: Needs Assistance Bed Mobility: Supine to Sit Rolling: Supervision (much improved)   Supine to sit: Min assist     General bed mobility comments: somewhat lightheaded upon sitting, but does not explicitly describe her symptoms very well    Transfers Overall transfer level: Needs assistance Equipment used: Rolling walker (2 wheels) Transfers: Sit to/from Stand;Bed to chair/wheelchair/BSC Sit to Stand: Mod assist (similar to previous days here)   Squat pivot transfers: Max assist (maxA squat pivot transfer from bed to chair to promote upright posture for meal tray)       General transfer comment: slight surface elevation (air mattress higher than standard hospital bed here)    Ambulation/Gait Ambulation/Gait assistance:  (unable, pt more dizzy upon standing, has elevated anxiety, decreased alertness, strong urge to return to sitting, subsequent orthostasis/decreased alertness.)                 Stairs             Wheelchair Mobility    Modified Rankin (Stroke Patients Only)       Balance  Cognition Arousal/Alertness: Awake/alert Behavior During Therapy: WFL for tasks assessed/performed Overall Cognitive Status: History of cognitive impairments - at baseline                                 General Comments: interactive; has short term memory impairment        Exercises      General Comments         Pertinent Vitals/Pain Pain Assessment:  (has some HA upon entry, suspect 2/2 elevated BP earlier)    Home Living                          Prior Function            PT Goals (current goals can now be found in the care plan section) Acute Rehab PT Goals PT Goal Formulation: With patient Time For Goal Achievement: 01/04/21 Potential to Achieve Goals: Fair Progress towards PT goals: Progressing toward goals    Frequency    Min 2X/week      PT Plan Current plan remains appropriate    Co-evaluation              AM-PAC PT "6 Clicks" Mobility   Outcome Measure  Help needed turning from your back to your side while in a flat bed without using bedrails?: None Help needed moving from lying on your back to sitting on the side of a flat bed without using bedrails?: A Little Help needed moving to and from a bed to a chair (including a wheelchair)?: Total Help needed standing up from a chair using your arms (e.g., wheelchair or bedside chair)?: Total Help needed to walk in hospital room?: Total Help needed climbing 3-5 steps with a railing? : Total 6 Click Score: 11    End of Session Equipment Utilized During Treatment: Gait belt Activity Tolerance: Treatment limited secondary to medical complications (Comment);No increased pain Patient left: in chair;with call bell/phone within reach;with chair alarm set Nurse Communication: Mobility status PT Visit Diagnosis: Unsteadiness on feet (R26.81);Difficulty in walking, not elsewhere classified (R26.2);Other abnormalities of gait and mobility (R26.89);Muscle weakness (generalized) (M62.81)     Time: 1420-1501 PT Time Calculation (min) (ACUTE ONLY): 41 min  Charges:  $Therapeutic Activity: 38-52 mins                    3:52 PM, 12/31/20 Etta Grandchild, PT, DPT Physical Therapist - [facility]  [phone] ([place])    [place] C 12/31/2020, 3:46 PM

## 2020-12-31 NOTE — Progress Notes (Signed)
OT Cancellation Note  Patient Details Name: Jennifer Carrillo MRN: [id number] DOB: Mar 03, 1933   Cancelled Treatment:    Reason Eval/Treat Not Completed: Patient at procedure or test/ unavailable. Pt currently off the floor for HD. OT to re-attempt at later time/date as able.  Fredirick Maudlin, OTR/L [place]

## 2021-01-01 DIAGNOSIS — R52 Pain, unspecified: Secondary | ICD-10-CM

## 2021-01-01 DIAGNOSIS — I739 Peripheral vascular disease, unspecified: Secondary | ICD-10-CM

## 2021-01-01 LAB — GLUCOSE, CAPILLARY
Glucose-Capillary: 207 mg/dL — ABNORMAL HIGH (ref 70–99)
Glucose-Capillary: 172 mg/dL — ABNORMAL HIGH (ref 70–99)

## 2021-01-01 NOTE — Progress Notes (Signed)
[facility]  ROUNDING NOTE   Subjective:   Third hemodialysis treatment yesterday. Tolerated treatment well.    Objective:  Vital signs in last 24 hours:  Temp:  [98.3 F (36.8 C)-99.4 F (37.4 C)] 98.3 F (36.8 C) (12/25 0949) Pulse Rate:  [67-92] 83 (12/25 0949) Resp:  [8-28] 16 (12/25 0949) BP: (132-181)/(40-104) 165/45 (12/25 0949) SpO2:  [97 %-100 %] 97 % (12/25 0949)  Weight change:  Filed Weights   12/29/20 1245 12/30/20 0915 12/30/20 1215  Weight: 83.4 kg 85.9 kg 84.9 kg    Intake/Output: I/O last 3 completed shifts: In: 100 [P.O.:100] Out: 2702 [Urine:1200; Other:1502]   Intake/Output this shift:  No intake/output data recorded.  Physical Exam: General: NAD, sitting up in bed  Head: Normocephalic, atraumatic. Moist oral mucosal membranes  Eyes: Anicteric, PERRL  Neck: Supple, trachea midline  Lungs:  Clear to auscultation  Heart: Regular rate and rhythm  Abdomen:  Soft, nontender,   Extremities:  no peripheral edema.  Neurologic: Nonfocal, moving all four extremities  Skin: No lesions  Access: RIJ permcath 12/29/20 Dr. Lucky Cowboy    Basic Metabolic Panel: Recent Labs  Lab 12/26/20 0309 12/28/20 1233 12/30/20 1306  NA 134* 131* 137  K 3.7 4.1 4.1  CL 94* 95* 102  CO2 29 25 27   GLUCOSE 196* 238* 114*  BUN 90* 76* 47*  CREATININE 6.21* 6.50* 2.97*  CALCIUM 7.3* 7.8* 8.4*  PHOS  --   --  3.4     Liver Function Tests: Recent Labs  Lab 12/30/20 1306  ALBUMIN 2.5*    No results for input(s): LIPASE, AMYLASE in the last 168 hours. No results for input(s): AMMONIA in the last 168 hours.  CBC: Recent Labs  Lab 12/26/20 0309 12/30/20 1306  WBC 17.0* 13.8*  HGB 8.0* 9.3*  HCT 24.7* 29.7*  MCV 81.0 85.1  PLT 424* 401*     Cardiac Enzymes: No results for input(s): CKTOTAL, CKMB, CKMBINDEX, TROPONINI in the last 168 hours.  BNP: Invalid input(s): POCBNP  CBG: Recent Labs  Lab 12/30/20 2026 12/31/20 0808 12/31/20 1701  12/31/20 2105 01/01/21 0945  GLUCAP 254* 118* 169* 295* 207*     Microbiology: Results for orders placed or performed during the hospital encounter of 12/18/20  Resp Panel by RT-PCR (Flu A&B, Covid) Nasopharyngeal Swab     Status: None   Collection Time: 12/18/20  5:19 PM   Specimen: Nasopharyngeal Swab; Nasopharyngeal(NP) swabs in vial transport medium  Result Value Ref Range Status   SARS Coronavirus 2 by RT PCR NEGATIVE NEGATIVE Final    Comment: (NOTE) SARS-CoV-2 target nucleic acids are NOT DETECTED.  The SARS-CoV-2 RNA is generally detectable in upper respiratory specimens during the acute phase of infection. The lowest concentration of SARS-CoV-2 viral copies this assay can detect is 138 copies/mL. A negative result does not preclude SARS-Cov-2 infection and should not be used as the sole basis for treatment or other patient management decisions. A negative result may occur with  improper specimen collection/handling, submission of specimen other than nasopharyngeal swab, presence of viral mutation(s) within the areas targeted by this assay, and inadequate number of viral copies(<138 copies/mL). A negative result must be combined with clinical observations, patient history, and epidemiological information. The expected result is Negative.  Fact Sheet for Patients:  EntrepreneurPulse.com.au  Fact Sheet for Healthcare Providers:  IncredibleEmployment.be  This test is no t yet approved or cleared by the [place] FDA and  has been authorized for detection and/or diagnosis  of SARS-CoV-2 by FDA under an Emergency Use Authorization (EUA). This EUA will remain  in effect (meaning this test can be used) for the duration of the COVID-19 declaration under Section 564(b)(1) of the Act, 21 U.S.C.section 360bbb-3(b)(1), unless the authorization is terminated  or revoked sooner.       Influenza A by PCR NEGATIVE NEGATIVE Final   Influenza  B by PCR NEGATIVE NEGATIVE Final    Comment: (NOTE) The Xpert Xpress SARS-CoV-2/FLU/RSV plus assay is intended as an aid in the diagnosis of influenza from Nasopharyngeal swab specimens and should not be used as a sole basis for treatment. Nasal washings and aspirates are unacceptable for Xpert Xpress SARS-CoV-2/FLU/RSV testing.  Fact Sheet for Patients: EntrepreneurPulse.com.au  Fact Sheet for Healthcare Providers: IncredibleEmployment.be  This test is not yet approved or cleared by the [place] FDA and has been authorized for detection and/or diagnosis of SARS-CoV-2 by FDA under an Emergency Use Authorization (EUA). This EUA will remain in effect (meaning this test can be used) for the duration of the COVID-19 declaration under Section 564(b)(1) of the Act, 21 U.S.C. section 360bbb-3(b)(1), unless the authorization is terminated or revoked.  Performed at [facility], [address] [place], [place] [address]   Urine Culture     Status: Abnormal   Collection Time: 12/18/20  5:30 PM   Specimen: Urine, Clean Catch  Result Value Ref Range Status   Specimen Description   Final    URINE, CLEAN CATCH Performed at [facility], [address], [place], [place] [address]    Special Requests   Final    NONE Performed at [facility], [address] [place], [place] [address]    Culture (A)  Final    <10,000 COLONIES/mL INSIGNIFICANT GROWTH Performed at [place] [address], [place], [place] [address]    Report Status 12/19/2020 FINAL  Final  Blood culture (routine x 2)     Status: None   Collection Time: 12/18/20 10:30 PM   Specimen: BLOOD  Result Value Ref Range Status   Specimen Description BLOOD BLOOD RIGHT HAND  Final   Special Requests   Final    BOTTLES DRAWN AEROBIC AND ANAEROBIC Blood Culture adequate volume   Culture   Final    NO GROWTH 10 DAYS Performed at [facility], [address], [place], [place] [address]    Report Status 12/28/2020 FINAL  Final  Blood culture (routine x 2)     Status: None   Collection Time: 12/18/20 10:35 PM   Specimen: BLOOD  Result Value Ref Range Status   Specimen Description BLOOD BLOOD LEFT HAND  Final   Special Requests   Final    BOTTLES DRAWN AEROBIC AND ANAEROBIC Blood Culture adequate volume   Culture   Final    NO GROWTH 10 DAYS Performed at [facility], [address], [place],  [address]    Report Status 12/28/2020 FINAL  Final  VZV PCR, CSF     Status: None   Collection Time: 12/19/20 10:52 AM   Specimen: Cerebrospinal Fluid  Result Value Ref Range Status   VZV PCR, CSF Negative Negative Final    Comment: (NOTE) No Varicella Zoster Virus DNA detected. Performed At: [facility] [place], [place] [id number] Rush Farmer MD YY:[id number]   CSF culture w Gram Stain     Status: None   Collection Time: 12/20/20  9:45 AM   Specimen: PATH Cytology  CSF; Cerebrospinal Fluid  Result Value Ref Range Status   Specimen Description   Final    CSF Performed at [facility], [address], [place], [place] [address]    Special Requests   Final    NONE Performed at [facility], [address] [place], [place] [address]    Gram Stain   Final    WBC SEEN NO RBC SEEN NO ORGANISMS SEEN Performed at [facility], [address], [place], [place] [address]    Culture   Final    NO GROWTH 3 DAYS Performed at [facility], [place] [address], [place], [place] [address]    Report Status 12/23/2020 FINAL  Final    Coagulation Studies: No results for input(s): LABPROT, INR in the last 72 hours.  Urinalysis: No results for input(s): COLORURINE, LABSPEC, PHURINE, GLUCOSEU, HGBUR, BILIRUBINUR, KETONESUR, PROTEINUR, UROBILINOGEN, NITRITE, LEUKOCYTESUR in the last 72 hours.  Invalid input(s): APPERANCEUR    Imaging: No results  found.   Medications:     amLODipine  10 mg Oral Daily   anastrozole  1 mg Oral QPC supper   atorvastatin  40 mg Oral Daily   Chlorhexidine Gluconate Cloth  6 each Topical Q0600   docusate sodium  100 mg Oral BID   donepezil  10 mg Oral QHS   epoetin (EPOGEN/PROCRIT) injection  4,000 Units Subcutaneous Q T,Th,Sa-HD   heparin injection (subcutaneous)  5,000 Units Subcutaneous Q8H   hydrALAZINE  100 mg Oral Q8H   insulin aspart  0-5 Units Subcutaneous QHS   insulin aspart  0-9 Units Subcutaneous TID WC   insulin glargine-yfgn  8 Units Subcutaneous QHS   isosorbide mononitrate  60 mg Oral Daily   mometasone-formoterol  2 puff Inhalation BID   senna  1 tablet Oral Daily   acetaminophen **OR** acetaminophen, alum & mag hydroxide-simeth, docusate sodium, hydrALAZINE, labetalol, ondansetron **OR** ondansetron (ZOFRAN) IV, traMADol  Assessment/ Plan:  Ms. LAELAH SIRAVO is a 87 y.o. black female with end stage renal disease, hypertension, diabetes mellitus type II, coronary artery disease, breast cancer who is [facility] to [facility] for Bilateral leg pain [M79.604, M79.605] Sepsis ([place]) [A41.9] Acute bilateral low back pain, unspecified whether sciatica present [M54.50] Sepsis without acute organ dysfunction, due to unspecified organism ([place]) [A41.9]  Initiated on hemodialysis on this admission.   End Stage renal disease: seen and examined on hemodialysis treatment. Tolerating treatment well. Third dialysis treatment.  - outpatient planning for [facility]. TTS schedule. Patient to be there at [place] on 12/27. Regular appointment times will be 7am afterwards.   Hypertension: 165/45. Home regimen of telmisartan. Started on amlodipine, hydralazine and isosorbide mononitrate this admission   Diabetes mellitus type II with chronic kidney disease: insulin dependent. Hemoglobin A1c of 5.3%.   Anemia with chronic kidney disease: hemoglobin 9.3 - ESA with HD  treatments.  Secondary Hyperparathyroidism: PTH low for level of kidney dysfunction. phosphorus at goal. Not on binders. Not on a vitamin D agent.    LOS: [place] 12/25/202210:38 AM

## 2021-01-01 NOTE — TOC Transition Note (Signed)
Transition of Care [facility]) - CM/SW Discharge Note   Patient Details  Name: WILBERTA DORVIL MRN: [id number] Date of Birth: February 15, 1933  [facility]) CM/SW Contact:  Alberteen Sam, LCSW Phone Number: 01/01/2021, 9:46 AM   Clinical Narrative:     Patient to discharge home with home health. Cory with [facility] informed of patient's discharge today and orders are in.   No further discharge needs identified.   Final next level of care: [place] Barriers to Discharge: No Barriers Identified   Patient Goals and CMS Choice Patient states their goals for this hospitalization and ongoing recovery are:: to go home CMS Medicare.gov Compare Post Acute Care list provided to:: Patient Choice offered to / list presented to : Patient  Discharge Placement                    Patient and family notified of of transfer: 01/01/21  Discharge Plan and Services   Discharge Planning Services: CM Consult Post Acute Care Choice: Home Health          DME Arranged: N/A DME Agency: NA         HH Agency: [place] Date [facility] Agency Contacted: 01/01/21 Time [place]: 0945 Representative spoke with at [place]: [place] ([place]) Interventions     Readmission Risk Interventions Readmission Risk Prevention Plan 12/19/2020  Transportation Screening Complete  Medication Review Press photographer) Complete  PCP or Specialist appointment within 3-5 days of discharge Complete  HRI or Home Care Consult Complete  SW Recovery Care/Counseling Consult Complete  Palliative Care Screening Not [place] Complete  Some recent data might be hidden

## 2021-01-01 NOTE — Discharge Summary (Addendum)
[place] [facility] [place] NAME: Arna Luis    MR#:  [id number]  DATE OF BIRTH:  November 26, 1933  DATE OF ADMISSION:  12/18/2020 ADMITTING PHYSICIAN: Athena Masse, MD  DATE OF DISCHARGE: 01/01/2021  PRIMARY CARE PHYSICIAN: [name], MD    ADMISSION DIAGNOSIS:  Bilateral leg pain [M79.604, M79.605] Sepsis ([place]) [A41.9] Acute bilateral low back pain, unspecified whether sciatica present [M54.50] Sepsis without acute organ dysfunction, due to unspecified organism ([place]) [A41.9]  DISCHARGE DIAGNOSIS:  ESRD now on HD Sepsis ruled out Polyarthritis  SECONDARY DIAGNOSIS:   Past Medical History:  Diagnosis Date   Arthritis    Asthma    Calf pain    Cancer ([place]) 2016   left breast   COPD (chronic obstructive pulmonary disease) ([place])    Diabetes ([place])    Discoid lupus    Gastro-esophageal reflux    Glaucoma    Hyperlipemia    Hypertension    Iron deficiency anemia    Joint pain    Leg swelling    Osteoarthritis    PVD (peripheral vascular disease) ([place])    Sinus problem    Sleep apnea    No CPAP/ Can't tolerate   Stroke [facility]) [place]:  87 y.o. female with past medical history of diabetes mellitus type 2, hypertension, CKD stage IV, coronary artery disease, history of shingles diagnosed on 11/28 treated with Valtrex and gabapentin presented to hospital with 1 day history of diffuse pain extending from her lower back to her legs to the level where she was unable to get out of the bed.    Severe sepsis present on admission with altered mental status, acute kidney injury  Multiple investigations.  Negative for bacterial infection.   --Found to have polyarthritis, seen by rheumatology.   --Treated with a steroid injections and oral steroids with improvement.   --Currently off antibiotics.  Using low-dose tramadol for pain relief. --sepsis ruled out   Altered mental status, acute metabolic  encephalopathy due to above.  Improved. --resumed aricept   Acidemia, acute kidney injury superimposed on chronic kidney disease stage IV with metabolic acidosis--now progressed to ESRD  --Continued progressive azotemia.  -- Started on hemodialysis 12/22, 12/23, 12/24  -- Outpatient dialysis has been set up for 12/27 at 6 am--dter aware   Type 2 diabetes with hyperglycemia: with CKD Stage IV -- Blood sugars fairly stable on home dose of insulin. --Insulin adjusted   Coronary artery disease: --Patient was on digoxin, Ranexa, statin and Plavix. --Going to dialysis, digoxin discontinued.   --Ranexa can be resumed -- resume Plavix and a statin.   History of breast cancer: On Arimidex from home.   Deconditioning and debility: Initially worked up for SNF.  Now patient's family wants to take her home.     Anticipate go  home with home health PT OT once outpatient dialysis in am   Disposition Plan: Home with [facility]    Family communication :dter Cherylann Banas 12/24 Consults :nephrology, vascular CODE STATUS: FULL DVT Prophylaxis :Heparin Level of care: Telemetry Medical Status is: Inpatient   D/c home with [facility] Family does not wish to pursue Rehab per [facility]      CONSULTS OBTAINED:  Treatment Team:  Katha Cabal, MD  DRUG ALLERGIES:   Allergies  Allergen Reactions   Benazepril Nausea Only    DISCHARGE MEDICATIONS:   Allergies as of 01/01/2021       Reactions  Benazepril Nausea Only        Medication List     STOP taking these medications    digoxin 0.125 MG tablet Commonly known as: LANOXIN   fenofibrate 54 MG tablet   ferrous sulfate 325 (65 FE) MG EC tablet   hydrochlorothiazide 12.5 MG tablet Commonly known as: HYDRODIURIL   lidocaine 5 % Commonly known as: Lidoderm   sodium bicarbonate 650 MG tablet   sodium zirconium cyclosilicate 10 g Pack packet Commonly known as: LOKELMA   telmisartan 80 MG tablet Commonly known as: MICARDIS   vitamin  C 500 MG tablet Commonly known as: ASCORBIC ACID       TAKE these medications    Accu-Chek Guide Me w/Device Kit Use as instructed. DX e11.65   Accu-Chek Guide test strip Generic drug: glucose blood TEST BLOOD SUGAR THREE TIMES DAILY  AS  DIRECTED   Accu-Chek Softclix Lancets lancets USE AS INSTRUCTED 3 TIMES A DAY   acetaminophen 500 MG tablet Commonly known as: TYLENOL Take 1 tablet (500 mg total) by mouth every 6 (six) hours as needed for moderate pain or headache. What changed: how much to take   amLODipine 10 MG tablet Commonly known as: NORVASC Take 1 tablet (10 mg total) by mouth daily at breakfast   anastrozole 1 MG tablet Commonly known as: ARIMIDEX Take 1 tablet (1 mg total) by mouth daily; afternoon (dinner)   atorvastatin 40 MG tablet Commonly known as: LIPITOR Take 1 tablet by mouth daily.   B-D SINGLE USE SWABS REGULAR Pads Use as directed for 3 times daily DX E11.65   cholecalciferol 25 MCG (1000 UNIT) tablet Commonly known as: VITAMIN D TAKE 1 TABLET EVERY MONDAY, WEDNESDAY, AND FRIDAY. What changed: Another medication with the same name was removed. Continue taking this medication, and follow the directions you see here.   clopidogrel 75 MG tablet Commonly known as: PLAVIX TAKE 1 TABLET EVERY DAY AFTERNOON (DINNER)   docusate sodium 100 MG capsule Commonly known as: COLACE Take 200 mg by mouth at bedtime as needed for mild constipation.   donepezil 10 MG tablet Commonly known as: ARICEPT TAKE 1 TABLET BY MOUTH AT BEDTIME FOR MEMORY (NEED MD APPOINTMENT)   fluticasone-salmeterol 100-50 MCG/ACT Aepb Commonly known as: ADVAIR Inhale 1 puff into the lungs 2 (two) times daily. What changed:  when to take this reasons to take this   gabapentin 100 MG capsule Commonly known as: NEURONTIN Take 1 capsule (100 mg total) by mouth 3 (three) times daily.   hydrALAZINE 50 MG tablet Commonly known as: APRESOLINE Take 100 mg by mouth in the morning  and at bedtime. What changed: Another medication with the same name was removed. Continue taking this medication, and follow the directions you see here.   insulin NPH-regular Human (70-30) 100 UNIT/ML injection Inject 12 Units into the skin 2 (two) times daily. Inject 20 units subcutaneously in the morning and 20 units in the evening. What changed: how much to take   isosorbide mononitrate 60 MG 24 hr tablet Commonly known as: IMDUR Take 60 mg by mouth daily. What changed: Another medication with the same name was removed. Continue taking this medication, and follow the directions you see here.   ranolazine 500 MG 12 hr tablet Commonly known as: RANEXA Take 500 mg by mouth 2 (two) times daily; breakfast, Afternoon (dinner)        If you experience worsening of your admission symptoms, develop shortness of breath, life threatening emergency, suicidal or  homicidal thoughts you must seek medical attention immediately by calling 911 or calling your MD immediately  if symptoms less severe.  You Must read complete instructions/literature along with all the possible adverse reactions/side effects for all the Medicines you take and that have been prescribed to you. Take any new Medicines after you have completely understood and accept all the possible adverse reactions/side effects.   Please note  You were cared for by a hospitalist during your hospital stay. If you have any questions about your discharge medications or the care you received while you were in the hospital after you are discharged, you can call the unit and asked to speak with the hospitalist on call if the hospitalist that took care of you is not available. Once you are discharged, your primary care physician will handle any further medical issues. Please note that NO REFILLS for any discharge medications will be authorized once you are discharged, as it is imperative that you return to your primary care physician (or establish a  relationship with a primary care physician if you do not have one) for your aftercare needs so that they can reassess your need for medications and monitor your lab values. Today   SUBJECTIVE   Pt feels ok. No cp or sob  VITAL SIGNS:  Blood pressure (!) 162/94, pulse 67, temperature 98.8 F (37.1 C), resp. rate 16, height '5\' 7"'  (1.702 m), weight 84.9 kg, SpO2 100 %.  I/O:   Intake/Output Summary (Last 24 hours) at 01/01/2021 0755 Last data filed at 01/01/2021 0553 Gross per 24 hour  Intake 100 ml  Output 2302 ml  Net -2202 ml    PHYSICAL EXAMINATION:  GENERAL:  87 y.o.-year-old patient lying in the bed with no acute distress. Obese PErm cath right int jugular LUNGS: Normal breath sounds bilaterally, no wheezing, rales,rhonchi or crepitation. No use of accessory muscles of respiration.  CARDIOVASCULAR: S1, S2 normal. No murmurs, rubs, or gallops.  ABDOMEN: Soft, non-tender, non-distended. Bowel sounds present. No organomegaly or mass.  EXTREMITIES: trace pedal edema, no cyanosis, or clubbing. DJD+ NEUROLOGIC: non-focal PSYCHIATRIC:  patient is alert and oriented x 3.  SKIN: per RN   DATA REVIEW:   CBC  Recent Labs  Lab 12/30/20 1306  WBC 13.8*  HGB 9.3*  HCT 29.7*  PLT 401*    Chemistries  Recent Labs  Lab 12/30/20 1306  NA 137  K 4.1  CL 102  CO2 27  GLUCOSE 114*  BUN 47*  CREATININE 2.97*  CALCIUM 8.4*    Microbiology Results   No results found for this or any previous visit (from the past 240 hour(s)).  RADIOLOGY:  No results found.   CODE STATUS:     Code Status Orders  (From admission, onward)           Start     Ordered   12/18/20 2238  Full code  Continuous        12/18/20 2251           Code Status History     Date Active Date Inactive Code Status Order ID Comments User Context   06/25/2018 1125 06/27/2018 1752 Full Code [id number]  Katha Cabal, MD Inpatient   05/06/2018 0858 05/06/2018 1402 Full Code [id number]   Corey Skains, MD Inpatient   02/26/2018 1505 02/26/2018 2010 Full Code [id number]  Schnier, [name], MD Inpatient        TOTAL TIME TAKING CARE OF THIS PATIENT: 40 minutes.  Fritzi Mandes M.D  [facility]    CC: Primary care physician; [name], MD

## 2021-01-01 NOTE — Discharge Instructions (Signed)
Continue your out pt HD from Tuesday 12/27 with [facility] at 6 am

## 2021-01-01 NOTE — Plan of Care (Signed)

## 2021-01-03 ENCOUNTER — Telehealth: Payer: Self-pay | Admitting: Internal Medicine

## 2021-01-03 DIAGNOSIS — N186 End stage renal disease: Secondary | ICD-10-CM | POA: Diagnosis not present

## 2021-01-03 DIAGNOSIS — Z1159 Encounter for screening for other viral diseases: Secondary | ICD-10-CM | POA: Diagnosis not present

## 2021-01-03 DIAGNOSIS — Z992 Dependence on renal dialysis: Secondary | ICD-10-CM | POA: Diagnosis not present

## 2021-01-03 NOTE — Telephone Encounter (Signed)
Husband called to let us know that pt has been in hospital for the last 2 weeks and has been put on dialysis. She goes every Tues,Thurs.and Sat. She has an appt on 1-3 that need to be rescheduled. Call back at [phone]

## 2021-01-04 DIAGNOSIS — Z992 Dependence on renal dialysis: Secondary | ICD-10-CM | POA: Diagnosis not present

## 2021-01-04 DIAGNOSIS — N186 End stage renal disease: Secondary | ICD-10-CM | POA: Diagnosis not present

## 2021-01-05 ENCOUNTER — Other Ambulatory Visit: Payer: Self-pay

## 2021-01-05 DIAGNOSIS — N186 End stage renal disease: Secondary | ICD-10-CM | POA: Diagnosis not present

## 2021-01-05 DIAGNOSIS — Z992 Dependence on renal dialysis: Secondary | ICD-10-CM | POA: Diagnosis not present

## 2021-01-05 NOTE — Patient Outreach (Signed)
[facility]) Care Management  01/05/2021  SHYAN SCALISI 03/09/1933 [id number]   Hospital referral from [facility] [place] for RN case manager for chronic disease management services. Assigned to Enzo [place], RN Care Coordinator for follow up.  Thank you, [place] Care Management Assistant

## 2021-01-06 ENCOUNTER — Other Ambulatory Visit: Payer: Self-pay

## 2021-01-06 ENCOUNTER — Telehealth: Payer: Self-pay

## 2021-01-06 NOTE — Telephone Encounter (Signed)
Gave verbal order to [place] [phone] for evaluation for occupational therapy for next week and order for home health given from hospital

## 2021-01-06 NOTE — Patient Outreach (Signed)
[facility]) Care Management  01/06/2021  Jennifer Carrillo 08/09/1933 [id number]     Transition of Care Referral  Referral Date: 01/05/2021 Referral Source: [facility] Liaison Date of Discharge: 01/01/21 Facility:[facility] PCP Office Does TOC    Outreach attempt # 1 to patient. Successful outreach call placed to patient. Spoke with patient, spouse and daughter all via speaker phone. Patient requested that most of call be competed with daughter. Patient's self-reported health rating is fair. Patient resides in the home along with spouse. Patient is independent with ADLs/IADLs except spouse drives her to appts and family is assisting with med. Financial mgmt along with household chores while patient recovers.   Conditions: Per chart review, patient has PMH that includes but not limited to new diagnosis of ESRD(T,T,S), DM, shingles, left breast CA(2016), GERD,COPD, HLD, HTN,OA, PVD, stroke, sleep apnea(unable to tolerate CPAP). She was recently hospitalized from 12/11 to 01/01/21 and began dialysis txs during that time. Patient reports she is tolerating txs well so far. Last tx was yesterday.   Medications Reviewed Today     Reviewed by Hayden Pedro, RN (Registered Nurse) on 01/06/21 at 1033  Med List Status: <None>   Medication Order Taking? Sig Documenting Provider Last Dose Status Informant  ACCU-CHEK GUIDE test strip [id number] No TEST BLOOD SUGAR THREE TIMES DAILY  AS  DIRECTED [name], MD Taking Active Child  Accu-Chek Softclix Lancets lancets [id number] No USE AS INSTRUCTED 3 TIMES A DAY [name], MD Taking Active Child  acetaminophen (TYLENOL) 500 MG tablet [id number]  Take 1 tablet (500 mg total) by mouth every 6 (six) hours as needed for moderate pain or headache. Fritzi Mandes, MD  Active   Alcohol Swabs (B-D SINGLE USE SWABS REGULAR) PADS [id number] No Use as directed for 3 times daily DX E11.65 Kendell Bane, NP Taking Active Child   amLODipine (NORVASC) 10 MG tablet [id number] No Take 1 tablet (10 mg total) by mouth daily at breakfast [name], MD 12/17/2020 unknown Active Child  anastrozole (ARIMIDEX) 1 MG tablet [id number] No Take 1 tablet (1 mg total) by mouth daily; afternoon (dinner) [name], MD 12/17/2020 unknown Active Child  atorvastatin (LIPITOR) 40 MG tablet [id number] No Take 1 tablet by mouth daily. [provider] 12/17/2020 unknown Active Child  Blood Glucose Monitoring Suppl (ACCU-CHEK GUIDE ME) w/Device KIT [id number] No Use as instructed. DX e11.65 Kendell Bane, NP Taking Active Child  cholecalciferol (VITAMIN D) 25 MCG (1000 UNIT) tablet [id number] No TAKE 1 TABLET EVERY MONDAY, WEDNESDAY, AND FRIDAY.  Patient not taking: Reported on 12/18/2020   Luiz Ochoa, NP Not Taking Active Child  clopidogrel (PLAVIX) 75 MG tablet [id number] No TAKE 1 TABLET EVERY DAY AFTERNOON (DINNER) McDonough, Lauren K, PA-C 12/17/2020 1800 Active Child  docusate sodium (COLACE) 100 MG capsule [id number] No Take 200 mg by mouth at bedtime as needed for mild constipation. [provider] prn prn Active Child  donepezil (ARICEPT) 10 MG tablet [id number] No TAKE 1 TABLET BY MOUTH AT BEDTIME FOR MEMORY (NEED MD APPOINTMENT) McDonough, Si Gaul, PA-C 12/17/2020 2200 Active Child  fluticasone-salmeterol (ADVAIR) 100-50 MCG/ACT AEPB [id number] No Inhale 1 puff into the lungs 2 (two) times daily.  Patient taking differently: Inhale 1 puff into the lungs 2 (two) times daily as needed.   [name], MD prn prn Active Child, Self           Med Note Eula Flax Dec 18, 2020 10:57 PM) 08/15/2020 100-50 MCG/ACT AEPB (disp 60, 30d supply)   gabapentin (NEURONTIN) 100 MG capsule [id number] No Take 1 capsule (100 mg total) by mouth 3 (three) times daily. Mylinda Latina, PA-C 12/17/2020 unknown Active Child  hydrALAZINE (APRESOLINE) 50 MG tablet [id number] No Take 100 mg by mouth in the morning and at  bedtime. [provider] 12/17/2020 unknown Active Self, Child  insulin NPH-regular Human (70-30) 100 UNIT/ML injection [id number]  Inject 12 Units into the skin 2 (two) times daily. Inject 20 units subcutaneously in the morning and 20 units in the evening. Fritzi Mandes, MD  Active   isosorbide mononitrate (IMDUR) 60 MG 24 hr tablet [id number] No Take 60 mg by mouth daily. [provider] 12/17/2020 unknown Active Child  ranolazine (RANEXA) 500 MG 12 hr tablet [id number] No Take 500 mg by mouth 2 (two) times daily; breakfast, Afternoon (dinner) [name], MD 12/17/2020 unknown Active Child            Fall Risk 12/30/2020 12/30/2020 12/31/2020 12/31/2020 01/06/2021  Falls in the past year? - - - - 0  Was there an injury with Fall? - - - - 0  Fall Risk Category Calculator - - - - 0  Fall Risk Category - - - - Low  Patient Fall Risk Level High fall risk High fall risk High fall risk High fall risk Moderate fall risk  Patient at Risk for Falls Due to - - - - Impaired balance/gait;Impaired mobility;Medication side effect  Fall risk Follow up - - - - Falls evaluation completed;Education provided    Depression screen [facility] 2/9 01/06/2021 12/05/2020 03/24/2020 09/08/2019 02/13/2019  Decreased Interest 0 0 0 0 1  Down, Depressed, Hopeless 0 0 0 0 1  PHQ - 2 Score 0 0 0 0 2  Altered sleeping - - - - 0  Tired, decreased energy - - - - 2  Change in appetite - - - - 1  Feeling bad or failure about yourself  - - - - 1  Trouble concentrating - - - - 3  Moving slowly or fidgety/restless - - - - 1  Suicidal thoughts - - - - 0  PHQ-9 Score - - - - 10  Some recent data might be hidden    SDOH Screenings   Alcohol Screen: Not on file  Depression (PHQ2-9): Low Risk    PHQ-2 Score: 0  Financial Resource Strain: Low Risk    Difficulty of Paying Living Expenses: Not very hard  Food Insecurity: No Food Insecurity   Worried About Charity fundraiser in the Last Year: Never true   Ran Out  of Food in the Last Year: Never true  Housing: Not on file  Physical Activity: Not on file  Social Connections: Not on file  Stress: Not on file  Tobacco Use: Medium Risk   Smoking Tobacco Use: Former   Smokeless Tobacco Use: Never   Passive Exposure: Not on file  Transportation Needs: No Transportation Needs   Lack of Transportation (Medical): No   Lack of Transportation (Non-Medical): No    Care Plan : Consulting civil engineer POC  Updates made by Hayden Pedro, RN since 01/06/2021 12:00 AM     Problem: Care Coordination Needs and Ongoing Disease Mgmt Education and Support of Chronic Conditions-ESRD,DM,pressure wounds   Priority: High     Long-Range Goal: Development of POC for Mgmt of Chronic Condition-ESRD   Start Date: 01/06/2021  Expected End Date: 01/06/2022  Priority:  High  Note:    Current Barriers:  Knowledge Deficits related to plan of care for management of ESRD   RNCM Clinical Goal(s):  Patient will verbalize understanding of plan for management of ESRD as evidenced by mgmt of chronic conditions demonstrate Ongoing adherence to prescribed treatment plan for ESRD as evidenced by adherence to HD txs continue to work with RN Care Manager to address care management and care coordination needs related to  ESRD as evidenced by adherence to CM Team Scheduled appointments through collaboration with RN Care manager, provider, and care team.   Interventions: POC sent to PCP upon initial assessment, quarterly and with any changes in patient's conditions Inter-disciplinary care team collaboration (see longitudinal plan of care) Evaluation of current treatment plan related to  self management and patient's adherence to plan as established by provider   ESRD  (Status:  New goal.)  Long Term Goal Evaluation of current treatment plan related to ESRD,  self-management and patient's adherence to plan as established by provider. Discussed plans with patient for ongoing care  management follow up and provided patient with direct contact information for care management team Evaluation of current treatment plan related to ESRD and patient's adherence to plan as established by provider Provided education to patient re: ESRD, diet, what to expect with HD txs Reviewed medications with patient and discussed importance of adherence  Patient Goals/Self-Care Activities: Take all medications as prescribed Attend all scheduled provider appointments Call provider office for new concerns or questions  Adhere to HD treatment plan  Follow Up Plan:  Telephone follow up appointment with care management team member scheduled for:  within 4-5 wks The patient has been provided with contact information for the care management team and has been advised to call with any health related questions or concerns.      Long-Range Goal: Development of POC for Mgmt of Chronic Condition-DM   Start Date: 01/06/2021  Expected End Date: 01/06/2022  Priority: High  Note:    Current Barriers:  Chronic Disease Management support and education needs related to DMII   RNCM Clinical Goal(s):  Patient will take all medications exactly as prescribed and will call provider for medication related questions as evidenced by mgmt of chronic condition demonstrate Ongoing health management independence as evidenced by A1C level within normal parameters continue to work with RN Care Manager to address care management and care coordination needs related to  DMII as evidenced by adherence to CM Team Scheduled appointments through collaboration with RN Care manager, provider, and care team.   Interventions: POC sent to PCP upon initial assessment, quarterly and with any changes in patient's conditions Inter-disciplinary care team collaboration (see longitudinal plan of care) Evaluation of current treatment plan related to  self management and patient's adherence to plan as established by provider   Diabetes  Interventions:  (Status:  New goal.) Long Term Goal Assessed patient's understanding of A1c goal: <7% Provided education to patient about basic DM disease process Reviewed medications with patient and discussed importance of medication adherence Discussed plans with patient for ongoing care management follow up and provided patient with direct contact information for care management team Lab Results  Component Value Date   HGBA1C 5.3 12/18/2020   Patient Goals/Self-Care Activities: Take all medications as prescribed Call provider office for new concerns or questions  keep appointment with eye doctor check blood sugar at prescribed times: 1-2x/day check feet daily for cuts, sores or redness wash and dry feet carefully every day wear comfortable,  cotton socks wear comfortable, well-fitting shoes  Follow Up Plan:  Telephone follow up appointment with care management team member scheduled for:  within 4-5 wks The patient has been provided with contact information for the care management team and has been advised to call with any health related questions or concerns.      Long-Range Goal: Development of POC for Mgmt of Chronic Condition-Wounds   Start Date: 01/06/2021  Expected End Date: 01/06/2022  Priority: High  Note:   Current Barriers:  Knowledge Deficits related to plan of care for management of Tobacco Use   RNCM Clinical Goal(s):  Patient will continue to work with RN Care Manager to address care management and care coordination needs related to  skin breakdown as evidenced by adherence to CM Team Scheduled appointments Ongoing support and education  through collaboration with RN Care manager, provider, and care team.   Interventions: POC sent to PCP upon initial assessment, quarterly and with any changes in patient's conditions Inter-disciplinary care team collaboration (see longitudinal plan of care) Evaluation of current treatment plan related to  self management and  patient's adherence to plan as established by provider   Skin Breakdown( area to heel and buttocks)  (Status:  New goal.)  Long Term Goal Evaluation of current treatment plan related to  skin breakdown areas ,  self-management and patient's adherence to plan as established by provider. Discussed plans with patient for ongoing care management follow up and provided patient with direct contact information for care management team Evaluation of current treatment plan related to skin breakdown and patient's adherence to plan as established by provider Advised patient to adhere to wound care plan as established by [facility] Provided education to patient re: s/s of infection, pressure relief measures and when to seek medical attention  Patient Goals/Self-Care Activities: Attend all scheduled provider appointments Perform wound care as directed  and follow up with MD as needed  Follow Up Plan:  Telephone follow up appointment with care management team member scheduled for:  within 4-5wks The patient has been provided with contact information for the care management team and has been advised to call with any health related questions or concerns.       Consent: [facility] services reviewed and discussed with patient and family. Verbal consent for services given.  Plan: RN CM discussed with patient next outreach within 4-5wks. Patient agrees to care plan and follow up. RN CM will send barriers letter and route encounter to PCP. RN CM will send welcome letter to patient.  Enzo Montgomery, RN,BSN,CCM [facility] Telephonic Care Management Coordinator Direct Phone: [phone] Toll Free: [phone] Fax: [phone]

## 2021-01-07 DIAGNOSIS — Z992 Dependence on renal dialysis: Secondary | ICD-10-CM | POA: Diagnosis not present

## 2021-01-07 DIAGNOSIS — N186 End stage renal disease: Secondary | ICD-10-CM | POA: Diagnosis not present

## 2021-01-10 ENCOUNTER — Inpatient Hospital Stay: Payer: Medicare HMO

## 2021-01-10 ENCOUNTER — Telehealth: Payer: Self-pay

## 2021-01-10 ENCOUNTER — Inpatient Hospital Stay: Payer: Medicare HMO | Admitting: Nurse Practitioner

## 2021-01-10 DIAGNOSIS — Z992 Dependence on renal dialysis: Secondary | ICD-10-CM | POA: Diagnosis not present

## 2021-01-10 DIAGNOSIS — N186 End stage renal disease: Secondary | ICD-10-CM | POA: Diagnosis not present

## 2021-01-10 DIAGNOSIS — N2581 Secondary hyperparathyroidism of renal origin: Secondary | ICD-10-CM | POA: Diagnosis not present

## 2021-01-10 NOTE — Telephone Encounter (Signed)
Gave verbal order to [place] from [facility] [phone] for occupational therapy for 2 tmies a week for 3 week and 1 times a week for 2 week

## 2021-01-10 NOTE — Telephone Encounter (Signed)
Gave verbal order to [facility] tammy RN [phone] for nursing and wound care for once a week for 8 week also nurse that pt had blister not infected and also she was black scar on left heel and she having advised her she need appt to been seen to check it out if she was having pain

## 2021-01-12 ENCOUNTER — Ambulatory Visit: Payer: Medicare HMO | Admitting: Nurse Practitioner

## 2021-01-12 ENCOUNTER — Ambulatory Visit: Payer: Medicare HMO

## 2021-01-12 ENCOUNTER — Other Ambulatory Visit: Payer: Medicare HMO

## 2021-01-12 ENCOUNTER — Telehealth: Payer: Self-pay

## 2021-01-12 DIAGNOSIS — N2581 Secondary hyperparathyroidism of renal origin: Secondary | ICD-10-CM | POA: Diagnosis not present

## 2021-01-12 DIAGNOSIS — Z992 Dependence on renal dialysis: Secondary | ICD-10-CM | POA: Diagnosis not present

## 2021-01-12 DIAGNOSIS — N186 End stage renal disease: Secondary | ICD-10-CM | POA: Diagnosis not present

## 2021-01-12 NOTE — Telephone Encounter (Signed)
Physical therapist amy [phone] called for just FYI that they reschedule her evaluation for PT to Next week on Monday due to pt had other appt for dialysis

## 2021-01-13 ENCOUNTER — Inpatient Hospital Stay: Payer: Medicare HMO

## 2021-01-13 ENCOUNTER — Encounter: Payer: Self-pay | Admitting: Nurse Practitioner

## 2021-01-13 ENCOUNTER — Inpatient Hospital Stay: Payer: Medicare HMO | Attending: Nurse Practitioner

## 2021-01-13 ENCOUNTER — Other Ambulatory Visit: Payer: Self-pay

## 2021-01-13 ENCOUNTER — Inpatient Hospital Stay (HOSPITAL_BASED_OUTPATIENT_CLINIC_OR_DEPARTMENT_OTHER): Payer: Medicare HMO | Admitting: Nurse Practitioner

## 2021-01-13 ENCOUNTER — Ambulatory Visit (INDEPENDENT_AMBULATORY_CARE_PROVIDER_SITE_OTHER): Payer: Medicare HMO | Admitting: Physician Assistant

## 2021-01-13 ENCOUNTER — Encounter: Payer: Self-pay | Admitting: Physician Assistant

## 2021-01-13 VITALS — BP 158/53 | HR 70 | Temp 97.6°F | Resp 18

## 2021-01-13 VITALS — BP 150/50 | HR 72 | Temp 97.8°F | Resp 20 | Wt 164.0 lb

## 2021-01-13 DIAGNOSIS — R5383 Other fatigue: Secondary | ICD-10-CM | POA: Insufficient documentation

## 2021-01-13 DIAGNOSIS — Z992 Dependence on renal dialysis: Secondary | ICD-10-CM | POA: Diagnosis not present

## 2021-01-13 DIAGNOSIS — Z801 Family history of malignant neoplasm of trachea, bronchus and lung: Secondary | ICD-10-CM | POA: Insufficient documentation

## 2021-01-13 DIAGNOSIS — E1122 Type 2 diabetes mellitus with diabetic chronic kidney disease: Secondary | ICD-10-CM | POA: Insufficient documentation

## 2021-01-13 DIAGNOSIS — L89329 Pressure ulcer of left buttock, unspecified stage: Secondary | ICD-10-CM | POA: Diagnosis not present

## 2021-01-13 DIAGNOSIS — N281 Cyst of kidney, acquired: Secondary | ICD-10-CM | POA: Insufficient documentation

## 2021-01-13 DIAGNOSIS — Z8673 Personal history of transient ischemic attack (TIA), and cerebral infarction without residual deficits: Secondary | ICD-10-CM | POA: Diagnosis not present

## 2021-01-13 DIAGNOSIS — R0602 Shortness of breath: Secondary | ICD-10-CM | POA: Diagnosis not present

## 2021-01-13 DIAGNOSIS — M4726 Other spondylosis with radiculopathy, lumbar region: Secondary | ICD-10-CM | POA: Diagnosis not present

## 2021-01-13 DIAGNOSIS — Z833 Family history of diabetes mellitus: Secondary | ICD-10-CM | POA: Diagnosis not present

## 2021-01-13 DIAGNOSIS — M4316 Spondylolisthesis, lumbar region: Secondary | ICD-10-CM | POA: Insufficient documentation

## 2021-01-13 DIAGNOSIS — N3289 Other specified disorders of bladder: Secondary | ICD-10-CM | POA: Diagnosis not present

## 2021-01-13 DIAGNOSIS — N186 End stage renal disease: Secondary | ICD-10-CM | POA: Diagnosis not present

## 2021-01-13 DIAGNOSIS — Z79899 Other long term (current) drug therapy: Secondary | ICD-10-CM | POA: Insufficient documentation

## 2021-01-13 DIAGNOSIS — Z8249 Family history of ischemic heart disease and other diseases of the circulatory system: Secondary | ICD-10-CM | POA: Diagnosis not present

## 2021-01-13 DIAGNOSIS — Z87891 Personal history of nicotine dependence: Secondary | ICD-10-CM | POA: Diagnosis not present

## 2021-01-13 DIAGNOSIS — D631 Anemia in chronic kidney disease: Secondary | ICD-10-CM

## 2021-01-13 DIAGNOSIS — Z09 Encounter for follow-up examination after completed treatment for conditions other than malignant neoplasm: Secondary | ICD-10-CM | POA: Diagnosis not present

## 2021-01-13 DIAGNOSIS — I700 Atherosclerosis of aorta: Secondary | ICD-10-CM | POA: Insufficient documentation

## 2021-01-13 DIAGNOSIS — Z853 Personal history of malignant neoplasm of breast: Secondary | ICD-10-CM | POA: Insufficient documentation

## 2021-01-13 DIAGNOSIS — M2550 Pain in unspecified joint: Secondary | ICD-10-CM | POA: Insufficient documentation

## 2021-01-13 DIAGNOSIS — D509 Iron deficiency anemia, unspecified: Secondary | ICD-10-CM

## 2021-01-13 DIAGNOSIS — Z806 Family history of leukemia: Secondary | ICD-10-CM | POA: Insufficient documentation

## 2021-01-13 DIAGNOSIS — I120 Hypertensive chronic kidney disease with stage 5 chronic kidney disease or end stage renal disease: Secondary | ICD-10-CM | POA: Diagnosis not present

## 2021-01-13 DIAGNOSIS — M48061 Spinal stenosis, lumbar region without neurogenic claudication: Secondary | ICD-10-CM | POA: Insufficient documentation

## 2021-01-13 DIAGNOSIS — T148XXA Other injury of unspecified body region, initial encounter: Secondary | ICD-10-CM

## 2021-01-13 DIAGNOSIS — I10 Essential (primary) hypertension: Secondary | ICD-10-CM

## 2021-01-13 DIAGNOSIS — M5137 Other intervertebral disc degeneration, lumbosacral region: Secondary | ICD-10-CM | POA: Insufficient documentation

## 2021-01-13 LAB — BASIC METABOLIC PANEL
Anion gap: 9 (ref 5–15)
BUN: 28 mg/dL — ABNORMAL HIGH (ref 8–23)
CO2: 23 mmol/L (ref 22–32)
Calcium: 8.5 mg/dL — ABNORMAL LOW (ref 8.9–10.3)
Chloride: 106 mmol/L (ref 98–111)
Creatinine, Ser: 3.61 mg/dL — ABNORMAL HIGH (ref 0.44–1.00)
GFR, Estimated: 12 mL/min — ABNORMAL LOW (ref >60)
Glucose, Bld: 129 mg/dL — ABNORMAL HIGH (ref 70–99)
Potassium: 3.1 mmol/L — ABNORMAL LOW (ref 3.5–5.1)
Sodium: 138 mmol/L (ref 135–145)

## 2021-01-13 LAB — CBC WITH DIFFERENTIAL/PLATELET
Abs Immature Granulocytes: 0.07 10*3/uL (ref 0.00–0.07)
Basophils Absolute: 0 10*3/uL (ref 0.0–0.1)
Basophils Relative: 1 %
Eosinophils Absolute: 0.1 10*3/uL (ref 0.0–0.5)
Eosinophils Relative: 1 %
HCT: 26.3 % — ABNORMAL LOW (ref 36.0–46.0)
Hemoglobin: 8.3 g/dL — ABNORMAL LOW (ref 12.0–15.0)
Immature Granulocytes: 1 %
Lymphocytes Relative: 30 %
Lymphs Abs: 1.8 10*3/uL (ref 0.7–4.0)
MCH: 27.8 pg (ref 26.0–34.0)
MCHC: 31.6 g/dL (ref 30.0–36.0)
MCV: 88 fL (ref 80.0–100.0)
Monocytes Absolute: 0.7 10*3/uL (ref 0.1–1.0)
Monocytes Relative: 12 %
Neutro Abs: 3.3 10*3/uL (ref 1.7–7.7)
Neutrophils Relative %: 55 %
Platelets: 238 10*3/uL (ref 150–400)
RBC: 2.99 MIL/uL — ABNORMAL LOW (ref 3.87–5.11)
RDW: 18.1 % — ABNORMAL HIGH (ref 11.5–15.5)
WBC: 5.9 10*3/uL (ref 4.0–10.5)
nRBC: 0 % (ref 0.0–0.2)

## 2021-01-13 MED ORDER — SODIUM CHLORIDE 0.9 % IV SOLN: 200.0000 mg | Freq: Once | INTRAVENOUS | Status: DC

## 2021-01-13 MED ORDER — IRON SUCROSE 20 MG/ML IV SOLN
200.0000 mg | Freq: Once | INTRAVENOUS | Status: AC
  Administered 2021-01-13: 200 mg via INTRAVENOUS
  Filled 2021-01-13: qty 10

## 2021-01-13 MED ORDER — EPOETIN ALFA-EPBX 40000 UNIT/ML IJ SOLN: 20000.0000 [IU] | Freq: Once | INTRAMUSCULAR | Status: DC

## 2021-01-13 MED ORDER — SODIUM CHLORIDE 0.9 % IV SOLN
Freq: Once | INTRAVENOUS | Status: AC
  Filled 2021-01-13: qty 250

## 2021-01-13 NOTE — Patient Instructions (Signed)

## 2021-01-13 NOTE — Progress Notes (Signed)
[facility] [place], [place] [address]  Internal MEDICINE  Office Visit Note  Patient Name: Jennifer Carrillo  [id number]  [id number]  Date of Service: 01/17/2021     Chief Complaint  Patient presents with   Hospitalization Follow-up    Left Leg pain, kidney failure, has a few bed sores on buttocks     HPI Pt is here for recent hospital follow up with her daughter -She presented to ED with weakness and low back pain down in to legs. Her BP was very elevated in ED and had a fever of 101.8. -Based on presentation there was concern for sepsis without clear source and was given ABX and acyclovir due to AMS -She was found to have AKI on CKD IV with metabolic acidosis and acute metabolic encephalopathy. For her elevated BP she was continued on home amlodipine and given IV hydralazine -She was admitted from 12/11-12/25 -Sepsis was ruled out and ABX stopped. -Was found to have polyarthritis and was given steroids with improvement -Kidney disease was found to have progressed to ESRD and patient was started on dialysis during stay and is now doing OP dialysis 3x/week. She states she will continue to be followed by Dr. Candiss Norse for nephrology -She was originally going to discharge to SNF however family decided to take her home and now she has a home health nurse once per week and OT/PT will be seeing her twice per week. PT comes on Monday.  -Her home health nurse did notice that she has some healing bed sores on buttocks which on exam in office today do look to be improving. Also noticed to have dark area on back of left heel that she noticed during hospital stay. Appears to be consistent with blood blister possibly as a result of pressure while laying in bed. Both patient and daughter note that is has been improving and is less painful now. Discussed monitoring closely and avoiding any pressure to the area and to have nurse continue to monitor as well. -BP elevated in  office, but has not been high at home. Likely secondary to effort and pain coming into office today. Still has some pain in left leg. Will have them monitor BP closely and call if not controlled at home as meds may need to be adjusted.  Current Medication: Outpatient Encounter Medications as of 01/13/2021  Medication Sig Note   ACCU-CHEK GUIDE test strip TEST BLOOD SUGAR THREE TIMES DAILY  AS  DIRECTED    Accu-Chek Softclix Lancets lancets USE AS INSTRUCTED 3 TIMES A DAY    acetaminophen (TYLENOL) 500 MG tablet Take 1 tablet (500 mg total) by mouth every 6 (six) hours as needed for moderate pain or headache.    Alcohol Swabs (B-D SINGLE USE SWABS REGULAR) PADS Use as directed for 3 times daily DX E11.65    amLODipine (NORVASC) 10 MG tablet Take 1 tablet (10 mg total) by mouth daily at breakfast    anastrozole (ARIMIDEX) 1 MG tablet Take 1 tablet (1 mg total) by mouth daily; afternoon (dinner)    atorvastatin (LIPITOR) 40 MG tablet Take 1 tablet by mouth daily.    Blood Glucose Monitoring Suppl (ACCU-CHEK GUIDE ME) w/Device KIT Use as instructed. DX e11.65    cholecalciferol (VITAMIN D) 25 MCG (1000 UNIT) tablet TAKE 1 TABLET EVERY MONDAY, WEDNESDAY, AND FRIDAY.    clopidogrel (PLAVIX) 75 MG tablet TAKE 1 TABLET EVERY DAY AFTERNOON (DINNER)    docusate sodium (COLACE) 100 MG capsule Take  200 mg by mouth at bedtime as needed for mild constipation.    donepezil (ARICEPT) 10 MG tablet TAKE 1 TABLET BY MOUTH AT BEDTIME FOR MEMORY (NEED MD APPOINTMENT)    fluticasone-salmeterol (ADVAIR) 100-50 MCG/ACT AEPB Inhale 1 puff into the lungs 2 (two) times daily. (Patient taking differently: Inhale 1 puff into the lungs 2 (two) times daily as needed.) 12/18/2020: 08/15/2020 100-50 MCG/ACT AEPB (disp 60, 30d supply)    gabapentin (NEURONTIN) 100 MG capsule Take 1 capsule (100 mg total) by mouth 3 (three) times daily.    hydrALAZINE (APRESOLINE) 50 MG tablet Take 100 mg by mouth in the morning and at bedtime.     insulin NPH-regular Human (70-30) 100 UNIT/ML injection Inject 12 Units into the skin 2 (two) times daily. Inject 20 units subcutaneously in the morning and 20 units in the evening.    isosorbide mononitrate (IMDUR) 60 MG 24 hr tablet Take 60 mg by mouth daily.    ranolazine (RANEXA) 500 MG 12 hr tablet Take 500 mg by mouth 2 (two) times daily; breakfast, Afternoon (dinner)    No facility-administered encounter medications on file as of 01/13/2021.    Surgical History: Past Surgical History:  Procedure Laterality Date   BREAST SURGERY Left    left lumpectomy   CAROTID ANGIOGRAPHY Left 06/25/2018   Procedure: CAROTID ANGIOGRAPHY, possible intervention;  Surgeon: Katha Cabal, MD;  Location: [facility];  Service: Cardiovascular;  Laterality: Left;   CAROTID ENDARTERECTOMY Right    CAROTID PTA/STENT INTERVENTION Left 06/25/2018   Procedure: CAROTID PTA/STENT INTERVENTION;  Surgeon: Katha Cabal, MD;  Location: [place] [facility];  Service: Cardiovascular;  Laterality: Left;   CATARACT EXTRACTION Right 2013   CORONARY STENT PLACEMENT     L. L. E.   DIALYSIS/PERMA CATHETER INSERTION N/A 12/29/2020   Procedure: DIALYSIS/PERMA CATHETER INSERTION;  Surgeon: Algernon Huxley, MD;  Location: [facility];  Service: Cardiovascular;  Laterality: N/A;   EYE SURGERY Bilateral    cataract   LEFT HEART CATH AND CORONARY ANGIOGRAPHY Left 05/06/2018   Procedure: LEFT HEART CATH AND CORONARY ANGIOGRAPHY;  Surgeon: Corey Skains, MD;  Location: [facility];  Service: Cardiovascular;  Laterality: Left;   LOWER EXTREMITY ANGIOGRAPHY Right 01/22/2018   Procedure: LOWER EXTREMITY ANGIOGRAPHY;  Surgeon: Katha Cabal, MD;  Location: [facility];  Service: Cardiovascular;  Laterality: Right;   RENAL ANGIOGRAPHY Left 02/26/2018   Procedure: RENAL ANGIOGRAPHY;  Surgeon: Katha Cabal, MD;  Location: [facility];  Service: Cardiovascular;  Laterality:  Left;   STENT PLACEMENT VASCULAR ([facility] HX) Left    stent placement on LLE   TUBAL LIGATION      Medical History: Past Medical History:  Diagnosis Date   Arthritis    Asthma    Calf pain    Cancer ([place]) 2016   left breast   COPD (chronic obstructive pulmonary disease) (HCC)    Diabetes (HCC)    Discoid lupus    Gastro-esophageal reflux    Glaucoma    Hyperlipemia    Hypertension    Iron deficiency anemia    Joint pain    Leg swelling    Osteoarthritis    PVD (peripheral vascular disease) ([place])    Sinus problem    Sleep apnea    No CPAP/ Can't tolerate   Stroke ([place]) 1987    Family History: Family History  Problem Relation Age of Onset   Heart disease Mother  Diabetes Other    Hypertension Other    Diabetes Sister    Lung cancer Brother    Leukemia Daughter    Bladder Cancer Neg Hx    Kidney disease Neg Hx    Prostate cancer Neg Hx     Social History   Socioeconomic History   Marital status: Married    Spouse name: Not on file   Number of children: Not on file   Years of education: Not on file   Highest education level: Not on file  Occupational History   Not on file  Tobacco Use   Smoking status: Former    Types: Cigarettes    Quit date: 50    Years since quitting: 36.0   Smokeless tobacco: Never   Tobacco comments:    quit 31 years  Vaping Use   Vaping Use: Never used  Substance and Sexual Activity   Alcohol use: No   Drug use: No   Sexual activity: Not on file  Other Topics Concern   Not on file  Social History Narrative   Walks with a rolling walker; remote hx of smoking; no alcohol; [place]- with husband/son. Daughter lives in [place].    Social Determinants of Health   Financial Resource Strain: Low Risk    Difficulty of Paying Living Expenses: Not very hard  Food Insecurity: No Food Insecurity   Worried About Charity fundraiser in the Last Year: Never true   Ran Out of Food in the Last Year: Never true  Transportation  Needs: No Transportation Needs   Lack of Transportation (Medical): No   Lack of Transportation (Non-Medical): No  Physical Activity: Not on file  Stress: Not on file  Social Connections: Not on file  Intimate Partner Violence: Not on file      Review of Systems  Constitutional:  Negative for chills, fatigue and unexpected weight change.  HENT:  Negative for congestion, rhinorrhea, sneezing and sore throat.   Eyes:  Negative for redness.  Respiratory:  Negative for cough, chest tightness and shortness of breath.   Cardiovascular:  Negative for chest pain and palpitations.  Gastrointestinal:  Negative for abdominal pain, constipation, diarrhea, nausea and vomiting.  Genitourinary:  Negative for dysuria and frequency.  Musculoskeletal:  Positive for arthralgias and gait problem. Negative for back pain, joint swelling and neck pain.  Skin:  Positive for wound. Negative for rash.       Dark area on back of left heel, bed sores on buttocks  Neurological:  Negative for tremors and numbness.  Hematological:  Negative for adenopathy. Does not bruise/bleed easily.  Psychiatric/Behavioral:  Negative for behavioral problems (Depression), sleep disturbance and suicidal ideas. The patient is not nervous/anxious.    Vital Signs: BP (!) 170/60    Pulse 76    Temp 98 F (36.7 C)    Resp 16    Ht _0  (1.702 m)    Wt 164 lb 6.4 oz (74.6 kg)    SpO2 100%    BMI 25.75 kg/m    Physical Exam Vitals and nursing note reviewed.  Constitutional:      General: She is not in acute distress.    Appearance: She is well-developed. She is not diaphoretic.  HENT:     Head: Normocephalic and atraumatic.     Mouth/Throat:     Pharynx: No oropharyngeal exudate.  Eyes:     Pupils: Pupils are equal, round, and reactive to light.  Neck:     Thyroid:  No thyromegaly.     Vascular: No JVD.     Trachea: No tracheal deviation.  Cardiovascular:     Rate and Rhythm: Normal rate and regular rhythm.     Heart  sounds: Normal heart sounds. No murmur heard.   No friction rub. No gallop.  Pulmonary:     Effort: Pulmonary effort is normal. No respiratory distress.     Breath sounds: No wheezing or rales.  Chest:     Chest wall: No tenderness.  Abdominal:     General: Bowel sounds are normal.     Palpations: Abdomen is soft.  Musculoskeletal:        General: Normal range of motion.     Cervical back: Normal range of motion and neck supple.  Lymphadenopathy:     Cervical: No cervical adenopathy.  Skin:    General: Skin is warm and dry.     Findings: Bruising and lesion present.     Comments: Dark, tender area on back of left heel consistent with blood blister; additionally has 2 healing superficial bed sores on buttock  Neurological:     Mental Status: She is alert and oriented to person, place, and time.     Cranial Nerves: No cranial nerve deficit.  Psychiatric:        Behavior: Behavior normal.        Thought Content: Thought content normal.        Judgment: Judgment normal.      Assessment/Plan: 1. Encounter for examination following treatment at hospital Reviewed hospital course  2. Primary hypertension Elevated in office, likely due to pain coming into office. Both patient and daughter report good control at home and will monitor closely.  3. ESRD (end stage renal disease) ([place]) Followed by nephrology, on dialysis now  4. Blood blister Likely blood blister on left heel that is improving. Will monitor closely and have home heath nurse continue to check on this as well. Avoid pressure to the area.  5. Pressure injury of skin of left buttock, unspecified injury stage Improving since leaving hospital and able to move more. Advised to continue to change positions frequently to avoid prolonged pressure to one area.   General Counseling: frederick klinger understanding of the findings of todays visit and agrees with plan of treatment. I have discussed any further diagnostic  evaluation that may be needed or ordered today. We also reviewed her medications today. she has been encouraged to call the office with any questions or concerns that should arise related to todays visit.    Counseling:    No orders of the defined types were placed in this encounter.   This patient was seen by Drema Dallas, PA-C in collaboration with Dr. Clayborn Bigness as a part of collaborative care agreement.   I have reviewed all medical records from hospital follow up including radiology reports and consults from other physicians. Appropriate follow up diagnostics will be scheduled as needed. Patient/ Family understands the plan of treatment. Time spent 35 minutes.   Dr [name], MD Internal Medicine

## 2021-01-13 NOTE — Progress Notes (Signed)
Jennifer Carrillo NOTE  Patient Care Team: [name], MD as PCP - General (Internal Medicine) Edythe Clarity, [facility] as Pharmacist (Pharmacist) Cammie Sickle, MD as Consulting Physician (Hematology and Oncology) Florance, Tomasa Blase, RN as [place] Management  CHIEF COMPLAINTS/PURPOSE OF CONSULTATION: ANEMIA  HEMATOLOGY HISTORY:  # ANEMIA sec to CKD [iron sat-8%; Ferritin- 152] 10/07/18: Colonoscopy/EGD polyps, diverticulae, hemorrhoids, non-obstructive Schatzki ring; NO capsule   # CKD stage IV-/ Lupus [> 50 years] ; Diabetes mellitus, type II on insulin  # SEP 2022-hypoglycemic episodes [insulin adjusted by endocrinology]  # Left breast cancer: IDC, 14 mm, grade 1, ER positive, PR equivocal, HER-2 2+/FISH negative; Breast, left savi guided partial mastectomy; 2017- Invasive ductal carcinoma with associated ductal carcinoma in situ (DCIS) (see synoptic) -Invasive carcinoma measures 14.0 mm in greatest dimension ;  Estrogen receptor: Positive, 91-100%, strong Progesterone receptor: Positive, 1-10%, her-2 NEG; No chemo; PCP- anastrazole.    Ref. Range 08/12/2020 10:32  Iron Latest Ref Range: 27 - 139 ug/dL 20 (L)  UIBC Latest Ref Range: 118 - 369 ug/dL 218  TIBC Latest Ref Range: 250 - 450 ug/dL 238 (L)  Ferritin Latest Ref Range: 15 - 150 ng/mL 152 (H)  Iron Saturation Latest Ref Range: 15 - 55 % 8 (LL)  Folate Latest Ref Range: >3.0 ng/mL 15.4    HISTORY OF PRESENTING ILLNESS: wheel chair; rolling walker at home; husband  Jennifer Carrillo 88 y.o. female is here for follow up for anemia/CKD stage IV. Previously received venofer x 5 however, infusions were held due to hypertension and hypoglycemia. In the interim she has started hemodialysis. She feels at baseline and denies black, bloody stools. She has persistent fatigue which is stable and unchanged.    Review of Systems  Constitutional:  Positive for malaise/fatigue.  Negative for chills, diaphoresis, fever and weight loss.  HENT:  Negative for nosebleeds and sore throat.   Eyes:  Negative for double vision.  Respiratory:  Positive for shortness of breath. Negative for cough, hemoptysis, sputum production and wheezing.   Cardiovascular:  Negative for chest pain, palpitations, orthopnea and leg swelling.  Gastrointestinal:  Negative for abdominal pain, blood in stool, constipation, diarrhea, heartburn, melena, nausea and vomiting.  Genitourinary:  Negative for dysuria, frequency and urgency.  Musculoskeletal:  Positive for joint pain. Negative for back pain.  Skin: Negative.  Negative for itching and rash.  Neurological:  Negative for dizziness, tingling, focal weakness, weakness and headaches.  Endo/Heme/Allergies:  Does not bruise/bleed easily.  Psychiatric/Behavioral:  Negative for depression. The patient is not nervous/anxious and does not have insomnia.    MEDICAL HISTORY:  Past Medical History:  Diagnosis Date   Arthritis    Asthma    Calf pain    Cancer ([place]) 2016   left breast   COPD (chronic obstructive pulmonary disease) (HCC)    Diabetes (HCC)    Discoid lupus    Gastro-esophageal reflux    Glaucoma    Hyperlipemia    Hypertension    Iron deficiency anemia    Joint pain    Leg swelling    Osteoarthritis    PVD (peripheral vascular disease) ([place])    Sinus problem    Sleep apnea    No CPAP/ Can't tolerate   Stroke ([place]) 1987    SURGICAL HISTORY: Past Surgical History:  Procedure Laterality Date   BREAST SURGERY Left    left lumpectomy   CAROTID ANGIOGRAPHY Left 06/25/2018   Procedure:  CAROTID ANGIOGRAPHY, possible intervention;  Surgeon: Katha Cabal, MD;  Location: [place] [facility];  Service: Cardiovascular;  Laterality: Left;   CAROTID ENDARTERECTOMY Right    CAROTID PTA/STENT INTERVENTION Left 06/25/2018   Procedure: CAROTID PTA/STENT INTERVENTION;  Surgeon: Katha Cabal, MD;  Location: [place] [facility];   Service: Cardiovascular;  Laterality: Left;   CATARACT EXTRACTION Right 2013   CORONARY STENT PLACEMENT     L. L. E.   DIALYSIS/PERMA CATHETER INSERTION N/A 12/29/2020   Procedure: DIALYSIS/PERMA CATHETER INSERTION;  Surgeon: Algernon Huxley, MD;  Location: [place] [facility];  Service: Cardiovascular;  Laterality: N/A;   EYE SURGERY Bilateral    cataract   LEFT HEART CATH AND CORONARY ANGIOGRAPHY Left 05/06/2018   Procedure: LEFT HEART CATH AND CORONARY ANGIOGRAPHY;  Surgeon: Corey Skains, MD;  Location: [place] [facility];  Service: Cardiovascular;  Laterality: Left;   LOWER EXTREMITY ANGIOGRAPHY Right 01/22/2018   Procedure: LOWER EXTREMITY ANGIOGRAPHY;  Surgeon: Katha Cabal, MD;  Location: [place] [facility];  Service: Cardiovascular;  Laterality: Right;   RENAL ANGIOGRAPHY Left 02/26/2018   Procedure: RENAL ANGIOGRAPHY;  Surgeon: Katha Cabal, MD;  Location: [place] [facility];  Service: Cardiovascular;  Laterality: Left;   STENT PLACEMENT VASCULAR ([facility] HX) Left    stent placement on LLE   TUBAL LIGATION      SOCIAL HISTORY: Social History   Socioeconomic History   Marital status: Married    Spouse name: Not on file   Number of children: Not on file   Years of education: Not on file   Highest education level: Not on file  Occupational History   Not on file  Tobacco Use   Smoking status: Former    Types: Cigarettes    Quit date: 84    Years since quitting: 36.0   Smokeless tobacco: Never   Tobacco comments:    quit 31 years  Vaping Use   Vaping Use: Never used  Substance and Sexual Activity   Alcohol use: No   Drug use: No   Sexual activity: Not on file  Other Topics Concern   Not on file  Social History Narrative   Walks with a rolling walker; remote hx of smoking; no alcohol; - with husband/son. Daughter lives in [place].    Social Determinants of Health   Financial Resource Strain: Low Risk    Difficulty of Paying Living  Expenses: Not very hard  Food Insecurity: No Food Insecurity   Worried About Charity fundraiser in the Last Year: Never true   Ran Out of Food in the Last Year: Never true  Transportation Needs: No Transportation Needs   Lack of Transportation (Medical): No   Lack of Transportation (Non-Medical): No  Physical Activity: Not on file  Stress: Not on file  Social Connections: Not on file  Intimate Partner Violence: Not on file    FAMILY HISTORY: Family History  Problem Relation Age of Onset   Heart disease Mother    Diabetes Other    Hypertension Other    Diabetes Sister    Lung cancer Brother    Leukemia Daughter    Bladder Cancer Neg Hx    Kidney disease Neg Hx    Prostate cancer Neg Hx     ALLERGIES:  is allergic to benazepril.  MEDICATIONS:  Current Outpatient Medications  Medication Sig Dispense Refill   ACCU-CHEK GUIDE test strip TEST BLOOD SUGAR THREE TIMES DAILY  AS  DIRECTED 300 strip 1   Accu-Chek Softclix Lancets lancets USE AS INSTRUCTED 3 TIMES A DAY 300 each 3   acetaminophen (TYLENOL) 500 MG tablet Take 1 tablet (500 mg total) by mouth every 6 (six) hours as needed for moderate pain or headache. 30 tablet 0   Alcohol Swabs (B-D SINGLE USE SWABS REGULAR) PADS Use as directed for 3 times daily DX E11.65 100 each 1   amLODipine (NORVASC) 10 MG tablet Take 1 tablet (10 mg total) by mouth daily at breakfast 90 tablet 1   anastrozole (ARIMIDEX) 1 MG tablet Take 1 tablet (1 mg total) by mouth daily; afternoon (dinner) 90 tablet 1   atorvastatin (LIPITOR) 40 MG tablet Take 1 tablet by mouth daily.     Blood Glucose Monitoring Suppl (ACCU-CHEK GUIDE ME) w/Device KIT Use as instructed. DX e11.65 1 kit 0   cholecalciferol (VITAMIN D) 25 MCG (1000 UNIT) tablet TAKE 1 TABLET EVERY MONDAY, WEDNESDAY, AND FRIDAY. 39 tablet 3   clopidogrel (PLAVIX) 75 MG tablet TAKE 1 TABLET EVERY DAY AFTERNOON (DINNER) 90 tablet 1   docusate sodium (COLACE) 100 MG capsule Take 200 mg by mouth  at bedtime as needed for mild constipation.     donepezil (ARICEPT) 10 MG tablet TAKE 1 TABLET BY MOUTH AT BEDTIME FOR MEMORY (NEED MD APPOINTMENT) 90 tablet 0   fluticasone-salmeterol (ADVAIR) 100-50 MCG/ACT AEPB Inhale 1 puff into the lungs 2 (two) times daily. (Patient taking differently: Inhale 1 puff into the lungs 2 (two) times daily as needed.) 1 each 3   gabapentin (NEURONTIN) 100 MG capsule Take 1 capsule (100 mg total) by mouth 3 (three) times daily. 90 capsule 0   hydrALAZINE (APRESOLINE) 50 MG tablet Take 100 mg by mouth in the morning and at bedtime.     insulin NPH-regular Human (70-30) 100 UNIT/ML injection Inject 12 Units into the skin 2 (two) times daily. Inject 20 units subcutaneously in the morning and 20 units in the evening. 10 mL 11   isosorbide mononitrate (IMDUR) 60 MG 24 hr tablet Take 60 mg by mouth daily.     ranolazine (RANEXA) 500 MG 12 hr tablet Take 500 mg by mouth 2 (two) times daily; breakfast, Afternoon (dinner) 180 tablet 1   No current facility-administered medications for this visit.      PHYSICAL EXAMINATION:   Vitals:   01/13/21 1315  BP: (!) 150/50  Pulse: 72  Resp: 20  Temp: 97.8 F (36.6 C)  SpO2: 100%   Filed Weights   01/13/21 1315  Weight: 164 lb (74.4 kg)    Physical Exam Constitutional:      Appearance: She is not ill-appearing.  Eyes:     General: No scleral icterus.    Conjunctiva/sclera: Conjunctivae normal.  Cardiovascular:     Rate and Rhythm: Normal rate and regular rhythm.  Abdominal:     General: There is no distension.     Palpations: Abdomen is soft.     Tenderness: There is no abdominal tenderness. There is no guarding.  Musculoskeletal:        General: No deformity.     Right lower leg: No edema.     Left lower leg: No edema.     Comments: Wheelchair. Unaccompanied.   Lymphadenopathy:     Cervical: No cervical adenopathy.  Skin:    General: Skin is warm and dry.  Neurological:     Mental Status: She is  alert and oriented to person, place, and time. Mental status is  at baseline.  Psychiatric:        Mood and Affect: Mood normal.        Behavior: Behavior normal.   LABORATORY DATA:  I have reviewed the data as listed Lab Results  Component Value Date   WBC 5.9 01/13/2021   HGB 8.3 (L) 01/13/2021   HCT 26.3 (L) 01/13/2021   MCV 88.0 01/13/2021   PLT 238 01/13/2021   Recent Labs    09/22/20 0837 10/12/20 1031 12/18/20 0834 12/19/20 0616 12/20/20 0231 12/25/20 0906 12/26/20 0309 12/28/20 1233 12/30/20 1306 01/13/21 1246  NA 140   < > 138 138   < > 135   < > 131* 137 138  K 4.1   < > 4.9 4.3   < > 3.3*   < > 4.1 4.1 3.1*  CL 107   < > 110 106   < > 93*   < > 95* 102 106  CO2 23   < > 18* 21*   < > 31   < > _0 GLUCOSE 152*   < > 219* 207*   < > 260*   < > 238* 114* 129*  BUN 33*   < > 46* 46*   < > 99*   < > 76* 47* 28*  CREATININE 3.03*   < > 4.11* 3.64*   < > 6.12*   < > 6.50* 2.97* 3.61*  CALCIUM 8.9   < > 9.0 8.8*   < > 7.3*   < > 7.8* 8.4* 8.5*  GFRNONAA 15*   < > 10* 12*   < > 6*   < > 6* 15* 12*  PROT 6.9  --  7.0 7.3  --   --   --   --   --   --   ALBUMIN 3.5  --  3.3* 2.9*  --  2.3*  --   --  2.5*  --   AST 20  --  28 36  --   --   --   --   --   --   ALT 12  --  15 17  --   --   --   --   --   --   ALKPHOS 80  --  91 93  --   --   --   --   --   --   BILITOT 0.4  --  1.0 1.0  --   --   --   --   --   --    < > = values in this interval not displayed.   Iron/TIBC/Ferritin/ %Sat    Component Value Date/Time   IRON 20 (L) 08/12/2020 1032   TIBC 238 (L) 08/12/2020 1032   FERRITIN 152 (H) 08/12/2020 1032   IRONPCTSAT 8 (LL) 08/12/2020 1032      DG Chest 1 View  Result Date: 12/18/2020 CLINICAL DATA:  Weakness, lower extremity pain EXAM: CHEST  1 VIEW COMPARISON:  11/26/2020 FINDINGS: Single frontal view of the chest demonstrates a stable cardiac silhouette. Stable atherosclerosis. No acute airspace disease, effusion, or pneumothorax. No acute bony  abnormality. IMPRESSION: 1. No acute intrathoracic process. Electronically Signed   By: Randa Ngo M.D.   On: 12/18/2020 15:37   DG Lumbar Spine 2-3 Views  Result Date: 12/18/2020 CLINICAL DATA:  Weakness, pain EXAM: LUMBAR SPINE - 2-3 VIEW COMPARISON:  None. FINDINGS: Degenerative spurring throughout the lumbar spine. Mild disc space narrowing at L5-S1.  Degenerative facet disease. Normal alignment. No fracture. Aortic atherosclerosis. Left renal artery and iliac artery stents bilaterally. IMPRESSION: Degenerative changes.  No acute bony abnormality. Electronically Signed   By: Rolm Baptise M.D.   On: 12/18/2020 15:38   DG Pelvis 1-2 Views  Result Date: 12/18/2020 CLINICAL DATA:  Pain, weakness EXAM: PELVIS - 1-2 VIEW COMPARISON:  None. FINDINGS: Early spurring in the hip joints bilaterally. No acute bony abnormality. Specifically, no fracture, subluxation, or dislocation. IMPRESSION: No acute bony abnormality. Electronically Signed   By: Rolm Baptise M.D.   On: 12/18/2020 15:37   DG Knee 1-2 Views Left  Result Date: 12/21/2020 CLINICAL DATA:  Bilateral knee pain. EXAM: LEFT KNEE - 1-2 VIEW COMPARISON:  None. FINDINGS: No fracture or dislocation is noted. Small suprapatellar joint effusion is noted. Moderate narrowing of the medial and lateral joint spaces are noted. IMPRESSION: Moderate degenerative joint disease. Small suprapatellar joint effusion. No fracture or dislocation is noted. Electronically Signed   By: Marijo Conception M.D.   On: 12/21/2020 15:41   DG Knee 1-2 Views Right  Result Date: 12/21/2020 CLINICAL DATA:  Bilateral knee pain. EXAM: RIGHT KNEE - 1-2 VIEW COMPARISON:  None. FINDINGS: No fracture or dislocation is noted. Moderate size suprapatellar joint effusion is noted. Severe narrowing of lateral joint space is noted. Mild degenerative changes noted medially. IMPRESSION: Moderate size suprapatellar joint effusion. Severe degenerative joint disease is noted laterally. No  fracture or dislocation is noted. Electronically Signed   By: Marijo Conception M.D.   On: 12/21/2020 15:42   MR LUMBAR SPINE WO CONTRAST  Result Date: 12/18/2020 CLINICAL DATA:  Lumbar radiculopathy. Rule out infection. Low back pain EXAM: MRI LUMBAR SPINE WITHOUT CONTRAST TECHNIQUE: Multiplanar, multisequence MR imaging of the lumbar spine was performed. No intravenous contrast was administered. COMPARISON:  Lumbar spine radiographs 12/18/2020 FINDINGS: Segmentation:  5 lumbar segments as noted on x-ray. Alignment: Slight anterolisthesis L4-5. Otherwise normal alignment. Vertebrae: Negative for fracture or mass. No evidence of spinal infection. Conus medullaris and cauda equina: Conus extends to the L1-2 level. Conus and cauda equina appear normal. Paraspinal and other soft tissues: Negative for paraspinous mass or adenopathy. Disc levels: L1-2: Negative L2-3: Mild disc bulging.  Negative for stenosis L3-4: Diffuse bulging of the disc. Small right foraminal disc protrusion. Bilateral facet degeneration. Mild subarticular stenosis bilaterally. Spinal canal adequate in size L4-5: Mild anterolisthesis. Diffuse disc bulging and bilateral facet degeneration. Mild spinal stenosis. Neural foramina patent bilaterally. L5-S1: Disc degeneration with diffuse endplate spurring. Moderate subarticular stenosis bilaterally. IMPRESSION: 1. Mild lumbar degenerative change.  Mild spinal stenosis as above 2. No evidence of spinal infection or fracture. Electronically Signed   By: Franchot Gallo M.D.   On: 12/18/2020 20:06   US RENAL  Result Date: 12/22/2020 CLINICAL DATA:  Acute kidney injury, history diabetes mellitus, COPD, discoid lupus, hypertension, coronary artery disease, former smoker, breast cancer EXAM: RENAL / URINARY TRACT ULTRASOUND COMPLETE COMPARISON:  None FINDINGS: Right Kidney: Renal measurements: 10.1 x 5.0 x 5.4 cm = volume: 140 mL. Normal cortical thickness. Slightly increased cortical echogenicity. Tiny  cyst at upper pole 13 x 12 x 12 mm. Small cyst mid kidney 18 x 17 x 16 mm. Tiny cyst mid kidney 11 mm diameter. No solid mass or hydronephrosis. No shadowing calcifications. Left Kidney: Renal measurements: 10.1 x 5.6 x 5.3 cm = volume: 153 mL. Normal cortical thickness. Increased cortical echogenicity. No mass, hydronephrosis, or shadowing calcification. Bladder: Appears normal for degree of bladder  distention. LEFT ureteral jet was visualized. Other: N/A IMPRESSION: Medical renal disease changes of both kidneys. Small RIGHT renal cysts. Electronically Signed   By: Lavonia [place] M.D.   On: 12/22/2020 17:20   PERIPHERAL VASCULAR CATHETERIZATION  Result Date: 12/29/2020 See surgical note for result.  [place] UE VEIN MAPPING LEFT (PRE-OP AVF)  Result Date: 12/22/2020 CLINICAL DATA:  Preop evaluation prior to AV fistula creation EXAM: [place] EXTREM UP VEIN MAPPING COMPARISON:  None. FINDINGS: LEFT ARTERIES Wrist Radial Artery: Size 58m Waveform monophasic Wrist Ulnar Artery: Size 238mWaveform monophasic Prox. Forearm Radial Artery: Size 34m41maveform triphasic Upper Arm Brachial Artery: Size 4mm92mveform triphasic LEFT VEINS Forearm Cephalic Vein: Prox 7mm 74mtal 2mm D26mh 2-4mm 0-1KP Arm Cephalic Vein: Prox thrombosed distal thrombosed depth 12mm U53ZS Arm Basilic Vein: Prox 7mm Di34ml 6mm Dep42m14-16mm Upp10mrm Brachial Vein: Prox 2mm Dista41mmm Depth 40m29mm ADDITI71m LEFT VEINS Axillary Vein:  8mm Subclavi18mVein: Patient: Yes Respiratory Phasicity: Present Internal Jugular Vein: Patent: Yes    Respiratory Phasicity: Present IMPRESSION: 1. Monophasic flow seen in the radial and ulnar arteries at the level of the wrist. 2. Upper arm cephalic vein is thrombosed. Electronically Signed   By: Farhaan  Mir Miachel Roux2/15/2022 16:26   DG FL GUIDED LUMBAR PUNCTURE  Result Date: 12/20/2020 CLINICAL DATA:  Patient complains of back pain with concern for infection request received for diagnostic lumbar puncture. EXAM:  DIAGNOSTIC LUMBAR PUNCTURE UNDER FLUOROSCOPIC GUIDANCE COMPARISON:  MR lumbar spine 12/18/2020, lumbar spine x-rays 12/18/2020 reviewed prior to the procedure. FLUOROSCOPY TIME:  Fluoroscopy Time:  0.2 minute Radiation Exposure Index (if provided by the fluoroscopic device): 6.60 mGy Number of Acquired Spot Images: 2 PROCEDURE: Informed consent was obtained from the patient's legal guardian prior to the procedure, including potential complications of bleeding, infection, paresthesias, nerve damage, CSF leak requiring additional procedures, post procedure requirement to lay flat for several hours after the procedure, headache, allergy, and pain. With the patient prone, the lower back was prepped with Betadine. 1% Lidocaine was used for local anesthesia. Lumbar puncture was performed at the L4-L5 level using a 20 gauge needle with return of colorless CSF with an opening pressure of 17 cm water. 10 ml of CSF were obtained for laboratory studies. The inner stylet was placed back into the needle and the needle was removed in its entirety. The patient tolerated the procedure well and there were no apparent complications. A sterile bandage was applied. IMPRESSION: Technically successful fluoroscopic guided lumbar puncture. This exam was performed by Koreen MorganTsosie Billings supervised and interpreted by Dr. Patel. ElectrPosey Prontolly Signed   By: Hetal  Patel Kathreen Devoid2/13/2022 10:25     Assessment & Plan:  No problem-specific Assessment & Plan notes found for this encounter.  Anemia of CKD- hemoglobin 8.3. s/p venofer x 12 October 2020. Proceed with venofer today. Hold EPO as she is receiving at dialysis.   End stage renal disease- on HD T, Th, Sa. She is receiving ESAs with HD and can also receive IV iron with nephrology as well. Discussed with Dr. Brahmanday whRogue Bussingds deferring future treatments to nephrology.    HTN: improved, now on HD. Blood pressure medications changed by nephrology. Now 150/50.     Stage I ER/PR positive breast cancer [2017; Dr.Brotherton]-left side status postlumpectomy; on anastrozole-followed by PCP.    DISPOSITION: RTC as needed. Follow up with nephrology, Dr. Kolluru for oJuleen Chinamanagement.   All questions were answered. The patient  knows to call the clinic with any problems, questions or concerns.    Verlon Au, NP 01/13/2021   CC: Dr. Juleen China

## 2021-01-14 DIAGNOSIS — N186 End stage renal disease: Secondary | ICD-10-CM | POA: Diagnosis not present

## 2021-01-14 DIAGNOSIS — N2581 Secondary hyperparathyroidism of renal origin: Secondary | ICD-10-CM | POA: Diagnosis not present

## 2021-01-14 DIAGNOSIS — Z992 Dependence on renal dialysis: Secondary | ICD-10-CM | POA: Diagnosis not present

## 2021-01-17 DIAGNOSIS — N186 End stage renal disease: Secondary | ICD-10-CM | POA: Diagnosis not present

## 2021-01-17 DIAGNOSIS — Z992 Dependence on renal dialysis: Secondary | ICD-10-CM | POA: Diagnosis not present

## 2021-01-17 DIAGNOSIS — N2581 Secondary hyperparathyroidism of renal origin: Secondary | ICD-10-CM | POA: Diagnosis not present

## 2021-01-18 ENCOUNTER — Telehealth: Payer: Self-pay

## 2021-01-18 NOTE — Telephone Encounter (Signed)
Jennifer Carrillo with [facility] [phone], pt was seen for physical therapy, verbal order plan assessment is 2 times a week for 3 weeks, and 1 time a week for 3 weeks

## 2021-01-18 NOTE — Telephone Encounter (Signed)
Faxed plan of care to [facility] at [phone] and held at [place].

## 2021-01-19 DIAGNOSIS — N2581 Secondary hyperparathyroidism of renal origin: Secondary | ICD-10-CM | POA: Diagnosis not present

## 2021-01-19 DIAGNOSIS — Z992 Dependence on renal dialysis: Secondary | ICD-10-CM | POA: Diagnosis not present

## 2021-01-19 DIAGNOSIS — N186 End stage renal disease: Secondary | ICD-10-CM | POA: Diagnosis not present

## 2021-01-20 ENCOUNTER — Telehealth: Payer: Self-pay

## 2021-01-20 NOTE — Telephone Encounter (Signed)
PT order signed by provider and faxed back to [facility] at [phone].

## 2021-01-21 DIAGNOSIS — N186 End stage renal disease: Secondary | ICD-10-CM | POA: Diagnosis not present

## 2021-01-21 DIAGNOSIS — N2581 Secondary hyperparathyroidism of renal origin: Secondary | ICD-10-CM | POA: Diagnosis not present

## 2021-01-21 DIAGNOSIS — Z992 Dependence on renal dialysis: Secondary | ICD-10-CM | POA: Diagnosis not present

## 2021-01-25 ENCOUNTER — Other Ambulatory Visit: Payer: Self-pay

## 2021-01-25 DIAGNOSIS — N2581 Secondary hyperparathyroidism of renal origin: Secondary | ICD-10-CM | POA: Diagnosis not present

## 2021-01-25 DIAGNOSIS — N186 End stage renal disease: Secondary | ICD-10-CM | POA: Diagnosis not present

## 2021-01-25 DIAGNOSIS — Z992 Dependence on renal dialysis: Secondary | ICD-10-CM | POA: Diagnosis not present

## 2021-01-25 NOTE — Patient Outreach (Signed)
[facility]) Care Management  01/25/2021  Jennifer Carrillo 1933/04/09 [id number]   Telephone Assessment    Outreach attempt #1 to patient. A female answered and reported pt was not at home at present.      Plan: RN CM will make outreach attempt within 4 business days.    Enzo Montgomery, RN,BSN,CCM [facility] Telephonic Care Management Coordinator Direct Phone: [phone] Toll Free: [phone] Fax: [phone]

## 2021-01-26 ENCOUNTER — Telehealth: Payer: Self-pay

## 2021-01-26 DIAGNOSIS — N186 End stage renal disease: Secondary | ICD-10-CM | POA: Diagnosis not present

## 2021-01-26 DIAGNOSIS — Z992 Dependence on renal dialysis: Secondary | ICD-10-CM | POA: Diagnosis not present

## 2021-01-26 DIAGNOSIS — N2581 Secondary hyperparathyroidism of renal origin: Secondary | ICD-10-CM | POA: Diagnosis not present

## 2021-01-26 NOTE — Telephone Encounter (Signed)
OT, PT and medical eval orders signed by provider and faxed back to [facility] at [phone]. Orders held in home health folder at the front desk.

## 2021-01-28 DIAGNOSIS — N186 End stage renal disease: Secondary | ICD-10-CM | POA: Diagnosis not present

## 2021-01-28 DIAGNOSIS — N2581 Secondary hyperparathyroidism of renal origin: Secondary | ICD-10-CM | POA: Diagnosis not present

## 2021-01-28 DIAGNOSIS — Z992 Dependence on renal dialysis: Secondary | ICD-10-CM | POA: Diagnosis not present

## 2021-01-30 ENCOUNTER — Telehealth: Payer: Self-pay

## 2021-01-30 ENCOUNTER — Other Ambulatory Visit: Payer: Self-pay

## 2021-01-30 NOTE — Telephone Encounter (Signed)
[facility] orders signed by Ander Purpura and faxed back to [phone]. Sent to be scanned-Toni

## 2021-01-30 NOTE — Patient Outreach (Signed)
[facility]) Care Management  01/30/2021  Jennifer Carrillo Oct 02, 1933 [id number]   Telephone Assessment      Outreach attempt #2 to patient. Spoke with patient who reports she has been doing fairly well. No new issues or concerns at present. Patient is working on trying to get eyeglasses replaced that were lost during hospital stay. She has completed PCP follow up appt. [facility] services still in place and coming out to see her weekly. She is adhering to HD schedule and denies any issues or concerns at this time.   Medications Reviewed Today     Reviewed by Hayden Pedro, RN (Registered Nurse) on 01/30/21 at [place] List Status: <None>   Medication Order Taking? Sig Documenting Provider Last Dose Status Informant  ACCU-CHEK GUIDE test strip [id number] No TEST BLOOD SUGAR THREE TIMES DAILY  AS  DIRECTED [name], MD Taking Active Child  Accu-Chek Softclix Lancets lancets [id number] No USE AS INSTRUCTED 3 TIMES A DAY [name], MD Taking Active Child  acetaminophen (TYLENOL) 500 MG tablet [id number] No Take 1 tablet (500 mg total) by mouth every 6 (six) hours as needed for moderate pain or headache. Fritzi Mandes, MD Taking Active   Alcohol Swabs (B-D SINGLE USE SWABS REGULAR) PADS [id number] No Use as directed for 3 times daily DX E11.65 Kendell Bane, NP Taking Active Child  amLODipine (NORVASC) 10 MG tablet [id number] No Take 1 tablet (10 mg total) by mouth daily at breakfast [name], MD Taking Active Child  anastrozole (ARIMIDEX) 1 MG tablet [id number] No Take 1 tablet (1 mg total) by mouth daily; afternoon (dinner) [name], MD Taking Active Child  atorvastatin (LIPITOR) 40 MG tablet [id number] No Take 1 tablet by mouth daily. [provider] Taking Active Child  Blood Glucose Monitoring Suppl (ACCU-CHEK GUIDE ME) w/Device KIT [id number] No Use as instructed. DX e11.65 Kendell Bane, NP Taking Active Child  cholecalciferol (VITAMIN D)  25 MCG (1000 UNIT) tablet [id number] No TAKE 1 TABLET EVERY MONDAY, WEDNESDAY, AND FRIDAY. Luiz Ochoa, NP Taking Active Child  clopidogrel (PLAVIX) 75 MG tablet [id number] No TAKE 1 TABLET EVERY DAY AFTERNOON (DINNER) McDonough, Lauren K, PA-C Taking Active Child  docusate sodium (COLACE) 100 MG capsule [id number] No Take 200 mg by mouth at bedtime as needed for mild constipation. [provider] Taking Active Child  donepezil (ARICEPT) 10 MG tablet [id number] No TAKE 1 TABLET BY MOUTH AT BEDTIME FOR MEMORY (NEED MD APPOINTMENT) McDonough, Si Gaul, PA-C Taking Active Child  fluticasone-salmeterol (ADVAIR) 100-50 MCG/ACT AEPB [id number] No Inhale 1 puff into the lungs 2 (two) times daily.  Patient taking differently: Inhale 1 puff into the lungs 2 (two) times daily as needed.   [name], MD Taking Active Child, Self           Med Note [place]ula Flax Dec 18, 2020 10:57 PM) 08/15/2020 100-50 MCG/ACT AEPB (disp 60, 30d supply)   gabapentin (NEURONTIN) 100 MG capsule [id number] No Take 1 capsule (100 mg total) by mouth 3 (three) times daily. McDonough, Si Gaul, PA-C Taking Active Child  hydrALAZINE (APRESOLINE) 50 MG tablet [id number] No Take 100 mg by mouth in the morning and at bedtime. [provider] Taking Active Self, Child  insulin NPH-regular Human (70-30) 100 UNIT/ML injection [id number] No Inject 12 Units into the skin 2 (two) times daily. Inject 20 units subcutaneously in the morning and 20 units in the evening. Posey Pronto,  Sona, MD Taking Active   isosorbide mononitrate (IMDUR) 60 MG 24 hr tablet [id number] No Take 60 mg by mouth daily. [provider] Taking Active Child  ranolazine (RANEXA) 500 MG 12 hr tablet [id number] No Take 500 mg by mouth 2 (two) times daily; breakfast, Afternoon (dinner) [name], MD Taking Active Child              Care Plan : RN Care Manager POC  Updates made by Hayden Pedro, RN since 01/30/2021 12:00 AM      Problem: Care Coordination Needs and Ongoing Disease Mgmt Education and Support of Chronic Conditions-ESRD,DM,pressure wounds   Priority: High     Long-Range Goal: Development of POC for Mgmt of Chronic Condition-ESRD   Start Date: 01/06/2021  Expected End Date: 01/06/2022  This Visit's Progress: On track  Priority: High  Note:    Current Barriers:  Knowledge Deficits related to plan of care for management of ESRD   RNCM Clinical Goal(s):  Patient will verbalize understanding of plan for management of ESRD as evidenced by mgmt of chronic conditions demonstrate Ongoing adherence to prescribed treatment plan for ESRD as evidenced by adherence to HD txs continue to work with RN Care Manager to address care management and care coordination needs related to  ESRD as evidenced by adherence to CM Team Scheduled appointments through collaboration with RN Care manager, provider, and care team.   Interventions: POC sent to PCP upon initial assessment, quarterly and with any changes in patient's conditions Inter-disciplinary care team collaboration (see longitudinal plan of care) Evaluation of current treatment plan related to  self management and patient's adherence to plan as established by provider   ESRD  (Status:  Goal on track:  Yes.)  Long Term Goal Evaluation of current treatment plan related to ESRD,  self-management and patient's adherence to plan as established by provider. Discussed plans with patient for ongoing care management follow up and provided patient with direct contact information for care management team Evaluation of current treatment plan related to ESRD and patient's adherence to plan as established by provider Provided education to patient re: ESRD, diet, what to expect with HD txs Reviewed medications with patient and discussed importance of adherence 01/30/21-Patient reports that she is adhering to H tx schedule. She denies any SEs or issues related to  txs.  Patient Goals/Self-Care Activities: Take all medications as prescribed Attend all scheduled provider appointments Call provider office for new concerns or questions  Adhere to HD treatment plan  Follow Up Plan:  Telephone follow up appointment with care management team member scheduled for:  within 4-5 wks The patient has been provided with contact information for the care management team and has been advised to call with any health related questions or concerns.      Long-Range Goal: Development of POC for Mgmt of Chronic Condition-DM   Start Date: 01/06/2021  Expected End Date: 01/06/2022  This Visit's Progress: On track  Priority: High  Note:    Current Barriers:  Chronic Disease Management support and education needs related to DMII   RNCM Clinical Goal(s):  Patient will take all medications exactly as prescribed and will call provider for medication related questions as evidenced by mgmt of chronic condition demonstrate Ongoing health management independence as evidenced by A1C level within normal parameters continue to work with RN Care Manager to address care management and care coordination needs related to  DMII as evidenced by adherence to CM Team Scheduled appointments through collaboration  with RN Care manager, provider, and care team.   Interventions: POC sent to PCP upon initial assessment, quarterly and with any changes in patient's conditions Inter-disciplinary care team collaboration (see longitudinal plan of care) Evaluation of current treatment plan related to  self management and patient's adherence to plan as established by provider   Diabetes Interventions:  (Status:  Goal on track:  Yes.) Long Term Goal Assessed patient's understanding of A1c goal: <7% Provided education to patient about basic DM disease process Reviewed medications with patient and discussed importance of medication adherence Discussed plans with patient for ongoing care management  follow up and provided patient with direct contact information for care management team Lab Results  Component Value Date   HGBA1C 5.3 12/18/2020  01/30/21-Patient denies any issues at present.   Patient Goals/Self-Care Activities: Take all medications as prescribed Call provider office for new concerns or questions  keep appointment with eye doctor check blood sugar at prescribed times: 1-2x/day check feet daily for cuts, sores or redness wash and dry feet carefully every day wear comfortable, cotton socks wear comfortable, well-fitting shoes  Follow Up Plan:  Telephone follow up appointment with care management team member scheduled for:  within 4-5 wks The patient has been provided with contact information for the care management team and has been advised to call with any health related questions or concerns.      Long-Range Goal: Development of POC for Mgmt of Chronic Condition-Wounds   Start Date: 01/06/2021  Expected End Date: 01/06/2022  This Visit's Progress: On track  Priority: High  Note:   Current Barriers:  Knowledge Deficits related to plan of care for management of Tobacco Use   RNCM Clinical Goal(s):  Patient will continue to work with RN Care Manager to address care management and care coordination needs related to  skin breakdown as evidenced by adherence to CM Team Scheduled appointments Ongoing support and education  through collaboration with RN Care manager, provider, and care team.   Interventions: POC sent to PCP upon initial assessment, quarterly and with any changes in patient's conditions Inter-disciplinary care team collaboration (see longitudinal plan of care) Evaluation of current treatment plan related to  self management and patient's adherence to plan as established by provider   Skin Breakdown( area to heel and buttocks)  (Status:  Goal on track:  Yes.)  Long Term Goal Evaluation of current treatment plan related to  skin breakdown areas ,   self-management and patient's adherence to plan as established by provider. Discussed plans with patient for ongoing care management follow up and provided patient with direct contact information for care management team Evaluation of current treatment plan related to skin breakdown and patient's adherence to plan as established by provider Advised patient to adhere to wound care plan as established by [facility] Provided education to patient re: s/s of infection, pressure relief measures and when to seek medical attention  01/30/21-Patient reports area to heels has shown some slight improvement. She did show area to PCP during follow up appt and no changes to PCO made. Patient continues to keep area elevated to relieve pressure.   Patient Goals/Self-Care Activities: Attend all scheduled provider appointments Perform wound care as directed  and follow up with MD as needed  Follow Up Plan:  Telephone follow up appointment with care management team member scheduled for:  within 4-5wks The patient has been provided with contact information for the care management team and has been advised to call with any health related  questions or concerns.         Plan: RN CM discussed with patient next outreach within 4-5wks.  Patient agrees to care plan and follow up.  Enzo Montgomery, RN,BSN,CCM [facility] Telephonic Care Management Coordinator Direct Phone: [phone] Toll Free: [phone] Fax: [phone]

## 2021-01-31 DIAGNOSIS — N186 End stage renal disease: Secondary | ICD-10-CM | POA: Diagnosis not present

## 2021-01-31 DIAGNOSIS — Z992 Dependence on renal dialysis: Secondary | ICD-10-CM | POA: Diagnosis not present

## 2021-01-31 DIAGNOSIS — N2581 Secondary hyperparathyroidism of renal origin: Secondary | ICD-10-CM | POA: Diagnosis not present

## 2021-02-01 ENCOUNTER — Telehealth: Payer: Self-pay

## 2021-02-01 NOTE — Telephone Encounter (Signed)
MSW Eval signed by provider and faxed back to [facility] at [phone] and placed in Home health folder at the front desk.

## 2021-02-02 DIAGNOSIS — N2581 Secondary hyperparathyroidism of renal origin: Secondary | ICD-10-CM | POA: Diagnosis not present

## 2021-02-02 DIAGNOSIS — Z992 Dependence on renal dialysis: Secondary | ICD-10-CM | POA: Diagnosis not present

## 2021-02-02 DIAGNOSIS — N186 End stage renal disease: Secondary | ICD-10-CM | POA: Diagnosis not present

## 2021-02-04 DIAGNOSIS — Z992 Dependence on renal dialysis: Secondary | ICD-10-CM | POA: Diagnosis not present

## 2021-02-04 DIAGNOSIS — N186 End stage renal disease: Secondary | ICD-10-CM | POA: Diagnosis not present

## 2021-02-04 DIAGNOSIS — N2581 Secondary hyperparathyroidism of renal origin: Secondary | ICD-10-CM | POA: Diagnosis not present

## 2021-02-07 DIAGNOSIS — Z992 Dependence on renal dialysis: Secondary | ICD-10-CM | POA: Diagnosis not present

## 2021-02-07 DIAGNOSIS — N2581 Secondary hyperparathyroidism of renal origin: Secondary | ICD-10-CM | POA: Diagnosis not present

## 2021-02-07 DIAGNOSIS — N186 End stage renal disease: Secondary | ICD-10-CM | POA: Diagnosis not present

## 2021-02-08 ENCOUNTER — Telehealth: Payer: Self-pay

## 2021-02-08 NOTE — Telephone Encounter (Signed)
Gave verbal order to [facility] [phone] for nursing for wound care  2 twice a week

## 2021-02-09 DIAGNOSIS — N186 End stage renal disease: Secondary | ICD-10-CM | POA: Diagnosis not present

## 2021-02-09 DIAGNOSIS — Z992 Dependence on renal dialysis: Secondary | ICD-10-CM | POA: Diagnosis not present

## 2021-02-09 DIAGNOSIS — N2581 Secondary hyperparathyroidism of renal origin: Secondary | ICD-10-CM | POA: Diagnosis not present

## 2021-02-10 ENCOUNTER — Other Ambulatory Visit: Payer: Self-pay

## 2021-02-10 ENCOUNTER — Telehealth: Payer: Self-pay

## 2021-02-10 ENCOUNTER — Ambulatory Visit (INDEPENDENT_AMBULATORY_CARE_PROVIDER_SITE_OTHER): Payer: Medicare HMO | Admitting: Physician Assistant

## 2021-02-10 VITALS — BP 147/101 | HR 67 | Temp 98.1°F | Resp 16 | Ht 67.0 in

## 2021-02-10 DIAGNOSIS — L97521 Non-pressure chronic ulcer of other part of left foot limited to breakdown of skin: Secondary | ICD-10-CM

## 2021-02-10 DIAGNOSIS — E1140 Type 2 diabetes mellitus with diabetic neuropathy, unspecified: Secondary | ICD-10-CM | POA: Diagnosis not present

## 2021-02-10 DIAGNOSIS — B351 Tinea unguium: Secondary | ICD-10-CM | POA: Diagnosis not present

## 2021-02-10 DIAGNOSIS — I10 Essential (primary) hypertension: Secondary | ICD-10-CM

## 2021-02-10 DIAGNOSIS — L97422 Non-pressure chronic ulcer of left heel and midfoot with fat layer exposed: Secondary | ICD-10-CM | POA: Diagnosis not present

## 2021-02-10 DIAGNOSIS — Z794 Long term (current) use of insulin: Secondary | ICD-10-CM | POA: Diagnosis not present

## 2021-02-10 MED ORDER — SULFAMETHOXAZOLE-TRIMETHOPRIM 800-160 MG PO TABS: 1.0000 | ORAL_TABLET | Freq: Two times a day (BID) | ORAL | 0 refills | Status: DC

## 2021-02-10 NOTE — Telephone Encounter (Signed)
[facility] home [facility] order signed by provider and faxed to [phone].

## 2021-02-10 NOTE — Telephone Encounter (Signed)
Home heath order for wound care signed by provider and faxed back to [facility] at [phone] and held in home health orders folder.

## 2021-02-10 NOTE — Progress Notes (Signed)
[facility] [place], [place] [address]  Internal MEDICINE  Office Visit Note  Patient Name: Jennifer Carrillo  [id number]  [id number]  Date of Service: 02/15/2021  Chief Complaint  Patient presents with   Open Wound    Has open wound on left foot - started in the hospital in December     HPI Pt is here for a sick visit. -Blood blister on left heel since hospital and now has opened, Was bleeding some and home health nurse has been wrapping it and suggested she be seen. -States it started bleeding about 2 weeks ago however did not appear like an actual open wound until recently -Daughter reports health nurse is not going to be coming out anymore -Denies any fevers or chills however site is painful and patient is unable to wear most shoes due to pain -Discussed on examination that I do want her to start on antibiotics and have her be further evaluated by her podiatrist for wound management.  Podiatry was contacted while patient still in office today and is able to see patient this morning therefore patient will follow-up in podiatry office today for further wound management  Current Medication:  Outpatient Encounter Medications as of 02/10/2021  Medication Sig Note   ACCU-CHEK GUIDE test strip TEST BLOOD SUGAR THREE TIMES DAILY  AS  DIRECTED    Accu-Chek Softclix Lancets lancets USE AS INSTRUCTED 3 TIMES A DAY    acetaminophen (TYLENOL) 500 MG tablet Take 1 tablet (500 mg total) by mouth every 6 (six) hours as needed for moderate pain or headache.    Alcohol Swabs (B-D SINGLE USE SWABS REGULAR) PADS Use as directed for 3 times daily DX E11.65    amLODipine (NORVASC) 10 MG tablet Take 1 tablet (10 mg total) by mouth daily at breakfast    anastrozole (ARIMIDEX) 1 MG tablet Take 1 tablet (1 mg total) by mouth daily; afternoon (dinner)    atorvastatin (LIPITOR) 40 MG tablet Take 1 tablet by mouth daily.    Blood Glucose Monitoring Suppl (ACCU-CHEK GUIDE ME)  w/Device KIT Use as instructed. DX e11.65    cholecalciferol (VITAMIN D) 25 MCG (1000 UNIT) tablet TAKE 1 TABLET EVERY MONDAY, WEDNESDAY, AND FRIDAY.    clopidogrel (PLAVIX) 75 MG tablet TAKE 1 TABLET EVERY DAY AFTERNOON (DINNER)    docusate sodium (COLACE) 100 MG capsule Take 200 mg by mouth at bedtime as needed for mild constipation.    donepezil (ARICEPT) 10 MG tablet TAKE 1 TABLET BY MOUTH AT BEDTIME FOR MEMORY (NEED MD APPOINTMENT)    fluticasone-salmeterol (ADVAIR) 100-50 MCG/ACT AEPB Inhale 1 puff into the lungs 2 (two) times daily. (Patient taking differently: Inhale 1 puff into the lungs 2 (two) times daily as needed.) 12/18/2020: 08/15/2020 100-50 MCG/ACT AEPB (disp 60, 30d supply)    gabapentin (NEURONTIN) 100 MG capsule Take 1 capsule (100 mg total) by mouth 3 (three) times daily.    hydrALAZINE (APRESOLINE) 50 MG tablet Take 100 mg by mouth in the morning and at bedtime.    insulin NPH-regular Human (70-30) 100 UNIT/ML injection Inject 12 Units into the skin 2 (two) times daily. Inject 20 units subcutaneously in the morning and 20 units in the evening.    isosorbide mononitrate (IMDUR) 60 MG 24 hr tablet Take 60 mg by mouth daily.    ranolazine (RANEXA) 500 MG 12 hr tablet Take 500 mg by mouth 2 (two) times daily; breakfast, Afternoon (dinner)    sulfamethoxazole-trimethoprim (BACTRIM DS) 800-160 MG  tablet Take 1 tablet by mouth 2 (two) times daily.    No facility-administered encounter medications on file as of 02/10/2021.      Medical History: Past Medical History:  Diagnosis Date   Arthritis    Asthma    Calf pain    Cancer ([place]) 2016   left breast   COPD (chronic obstructive pulmonary disease) (HCC)    Diabetes (HCC)    Discoid lupus    Gastro-esophageal reflux    Glaucoma    Hyperlipemia    Hypertension    Iron deficiency anemia    Joint pain    Leg swelling    Osteoarthritis    PVD (peripheral vascular disease) (HCC)    Sinus problem    Sleep apnea    No CPAP/  Can't tolerate   Stroke ([place]) 1987     Vital Signs: BP (!) 147/101    Pulse 67    Temp 98.1 F (36.7 C)    Resp 16    Ht '5\' 7"'  (1.702 m)    SpO2 99%    BMI 25.69 kg/m    Review of Systems  Constitutional:  Negative for fatigue and fever.  HENT:  Negative for congestion, mouth sores and postnasal drip.   Respiratory:  Negative for cough.   Cardiovascular:  Negative for chest pain.  Genitourinary:  Negative for flank pain.  Skin:  Positive for wound.       Open wound on left heel  Psychiatric/Behavioral: Negative.     Physical Exam Vitals and nursing note reviewed.  Constitutional:      General: She is not in acute distress.    Appearance: She is well-developed. She is obese. She is not diaphoretic.  HENT:     Head: Normocephalic and atraumatic.     Mouth/Throat:     Pharynx: No oropharyngeal exudate.  Eyes:     Pupils: Pupils are equal, round, and reactive to light.  Neck:     Thyroid: No thyromegaly.     Vascular: No JVD.     Trachea: No tracheal deviation.  Cardiovascular:     Rate and Rhythm: Normal rate and regular rhythm.     Heart sounds: Normal heart sounds. No murmur heard.   No friction rub. No gallop.  Pulmonary:     Effort: Pulmonary effort is normal. No respiratory distress.     Breath sounds: No wheezing or rales.  Chest:     Chest wall: No tenderness.  Abdominal:     General: Bowel sounds are normal.     Palpations: Abdomen is soft.  Musculoskeletal:        General: Normal range of motion.     Cervical back: Normal range of motion and neck supple.  Lymphadenopathy:     Cervical: No cervical adenopathy.  Skin:    General: Skin is warm and dry.     Findings: Lesion present.     Comments: Ulcer visualized along left heel with darker area consistent with dried blood adjacent from prior blood blister. Some erythema and drainage at site. Tenderness on exam  Neurological:     Mental Status: She is alert and oriented to person, place, and time.      Cranial Nerves: No cranial nerve deficit.  Psychiatric:        Behavior: Behavior normal.        Thought Content: Thought content normal.        Judgment: Judgment normal.      Assessment/Plan: 1. Ulcer  of left foot, limited to breakdown of skin ([place]) Start on Bactrim twice a day to help prevent any spread of infection from open ulcer.  Discussed need for keeping area clean and keeping pressure off wound.  Her podiatrist's office was contacted and patient is able to be seen later this morning for further evaluation and management of the open wound.  Patient understands to attend this visit and follow directions as indicated by podiatry. Patient should call office or go to ED if any acute worsening or fever develops. - sulfamethoxazole-trimethoprim (BACTRIM DS) 800-160 MG tablet; Take 1 tablet by mouth 2 (two) times daily.  Dispense: 20 tablet; Refill: 0  2. Primary hypertension Elevated in office likely due to pain, will continue to monitor closely at home   General Counseling: marielys trinidad understanding of the findings of todays visit and agrees with plan of treatment. I have discussed any further diagnostic evaluation that may be needed or ordered today. We also reviewed her medications today. she has been encouraged to call the office with any questions or concerns that should arise related to todays visit.    Counseling:    Orders Placed This Encounter  Procedures   Ambulatory referral to [facility] ordered this encounter  Medications   sulfamethoxazole-trimethoprim (BACTRIM DS) 800-160 MG tablet    Sig: Take 1 tablet by mouth 2 (two) times daily.    Dispense:  20 tablet    Refill:  0    Time spent:30 Minutes

## 2021-02-11 DIAGNOSIS — N2581 Secondary hyperparathyroidism of renal origin: Secondary | ICD-10-CM | POA: Diagnosis not present

## 2021-02-11 DIAGNOSIS — Z992 Dependence on renal dialysis: Secondary | ICD-10-CM | POA: Diagnosis not present

## 2021-02-11 DIAGNOSIS — N186 End stage renal disease: Secondary | ICD-10-CM | POA: Diagnosis not present

## 2021-02-14 DIAGNOSIS — N2581 Secondary hyperparathyroidism of renal origin: Secondary | ICD-10-CM | POA: Diagnosis not present

## 2021-02-14 DIAGNOSIS — N186 End stage renal disease: Secondary | ICD-10-CM | POA: Diagnosis not present

## 2021-02-14 DIAGNOSIS — Z992 Dependence on renal dialysis: Secondary | ICD-10-CM | POA: Diagnosis not present

## 2021-02-15 ENCOUNTER — Telehealth: Payer: Self-pay

## 2021-02-15 NOTE — Telephone Encounter (Signed)
Plan of care for wound care signed by provider and faxed back to [facility] health at [phone]. Held at front in home health folder.

## 2021-02-16 DIAGNOSIS — N2581 Secondary hyperparathyroidism of renal origin: Secondary | ICD-10-CM | POA: Diagnosis not present

## 2021-02-16 DIAGNOSIS — N186 End stage renal disease: Secondary | ICD-10-CM | POA: Diagnosis not present

## 2021-02-16 DIAGNOSIS — Z992 Dependence on renal dialysis: Secondary | ICD-10-CM | POA: Diagnosis not present

## 2021-02-18 DIAGNOSIS — N2581 Secondary hyperparathyroidism of renal origin: Secondary | ICD-10-CM | POA: Diagnosis not present

## 2021-02-18 DIAGNOSIS — Z992 Dependence on renal dialysis: Secondary | ICD-10-CM | POA: Diagnosis not present

## 2021-02-18 DIAGNOSIS — N186 End stage renal disease: Secondary | ICD-10-CM | POA: Diagnosis not present

## 2021-02-21 DIAGNOSIS — N186 End stage renal disease: Secondary | ICD-10-CM | POA: Diagnosis not present

## 2021-02-21 DIAGNOSIS — N2581 Secondary hyperparathyroidism of renal origin: Secondary | ICD-10-CM | POA: Diagnosis not present

## 2021-02-21 DIAGNOSIS — Z992 Dependence on renal dialysis: Secondary | ICD-10-CM | POA: Diagnosis not present

## 2021-02-23 DIAGNOSIS — N186 End stage renal disease: Secondary | ICD-10-CM | POA: Diagnosis not present

## 2021-02-23 DIAGNOSIS — Z992 Dependence on renal dialysis: Secondary | ICD-10-CM | POA: Diagnosis not present

## 2021-02-23 DIAGNOSIS — N2581 Secondary hyperparathyroidism of renal origin: Secondary | ICD-10-CM | POA: Diagnosis not present

## 2021-02-25 DIAGNOSIS — Z992 Dependence on renal dialysis: Secondary | ICD-10-CM | POA: Diagnosis not present

## 2021-02-25 DIAGNOSIS — N2581 Secondary hyperparathyroidism of renal origin: Secondary | ICD-10-CM | POA: Diagnosis not present

## 2021-02-25 DIAGNOSIS — N186 End stage renal disease: Secondary | ICD-10-CM | POA: Diagnosis not present

## 2021-02-28 ENCOUNTER — Other Ambulatory Visit: Payer: Self-pay

## 2021-02-28 DIAGNOSIS — N186 End stage renal disease: Secondary | ICD-10-CM | POA: Diagnosis not present

## 2021-02-28 DIAGNOSIS — Z992 Dependence on renal dialysis: Secondary | ICD-10-CM | POA: Diagnosis not present

## 2021-02-28 DIAGNOSIS — N2581 Secondary hyperparathyroidism of renal origin: Secondary | ICD-10-CM | POA: Diagnosis not present

## 2021-02-28 NOTE — Patient Outreach (Signed)
[facility]) Care Management  02/28/2021  GAURI GALVAO 1933-05-06 [id number]   Telephone Assessment   Unsuccessful outreach attempt to patient.      Plan: RN CM will make outreach attempt within 4 business days.   Enzo Montgomery, RN,BSN,CCM [facility] Management Telephonic Care Management Coordinator Direct Phone: [phone] Toll Free: [phone] Fax: [phone]

## 2021-03-01 ENCOUNTER — Other Ambulatory Visit: Payer: Self-pay

## 2021-03-01 NOTE — Patient Outreach (Signed)
[place] [facility]) Care Management  03/01/2021  SAYA MCCOLL 1933/04/07 [id number]   Telephone Assessment    Unsuccessful outreach attempt to patient.     Plan: RN CM will make outreach attempt within the month of March. RN CM will send unsuccessful outreach letter to patient.   Enzo Montgomery, RN,BSN,CCM [facility] Management Telephonic Care Management Coordinator Direct Phone: [phone] Toll Free: [phone] Fax: [phone]

## 2021-03-02 DIAGNOSIS — N2581 Secondary hyperparathyroidism of renal origin: Secondary | ICD-10-CM | POA: Diagnosis not present

## 2021-03-02 DIAGNOSIS — Z992 Dependence on renal dialysis: Secondary | ICD-10-CM | POA: Diagnosis not present

## 2021-03-02 DIAGNOSIS — N186 End stage renal disease: Secondary | ICD-10-CM | POA: Diagnosis not present

## 2021-03-04 DIAGNOSIS — N186 End stage renal disease: Secondary | ICD-10-CM | POA: Diagnosis not present

## 2021-03-04 DIAGNOSIS — Z992 Dependence on renal dialysis: Secondary | ICD-10-CM | POA: Diagnosis not present

## 2021-03-04 DIAGNOSIS — N2581 Secondary hyperparathyroidism of renal origin: Secondary | ICD-10-CM | POA: Diagnosis not present

## 2021-03-07 ENCOUNTER — Telehealth: Payer: Self-pay

## 2021-03-07 DIAGNOSIS — Z992 Dependence on renal dialysis: Secondary | ICD-10-CM | POA: Diagnosis not present

## 2021-03-07 DIAGNOSIS — N2581 Secondary hyperparathyroidism of renal origin: Secondary | ICD-10-CM | POA: Diagnosis not present

## 2021-03-07 DIAGNOSIS — N186 End stage renal disease: Secondary | ICD-10-CM | POA: Diagnosis not present

## 2021-03-07 NOTE — Telephone Encounter (Signed)
Plan of care for PT signed by [facility] and faxed back to [facility] at [phone].

## 2021-03-07 NOTE — Telephone Encounter (Signed)
Gave verbal order to [facility] [phone] for nursing for wound care once a week for 8 weeks until healed

## 2021-03-09 DIAGNOSIS — Z992 Dependence on renal dialysis: Secondary | ICD-10-CM | POA: Diagnosis not present

## 2021-03-09 DIAGNOSIS — N186 End stage renal disease: Secondary | ICD-10-CM | POA: Diagnosis not present

## 2021-03-09 DIAGNOSIS — N2581 Secondary hyperparathyroidism of renal origin: Secondary | ICD-10-CM | POA: Diagnosis not present

## 2021-03-10 DIAGNOSIS — Z794 Long term (current) use of insulin: Secondary | ICD-10-CM | POA: Diagnosis not present

## 2021-03-10 DIAGNOSIS — E1140 Type 2 diabetes mellitus with diabetic neuropathy, unspecified: Secondary | ICD-10-CM | POA: Diagnosis not present

## 2021-03-10 DIAGNOSIS — L97422 Non-pressure chronic ulcer of left heel and midfoot with fat layer exposed: Secondary | ICD-10-CM | POA: Diagnosis not present

## 2021-03-11 DIAGNOSIS — N2581 Secondary hyperparathyroidism of renal origin: Secondary | ICD-10-CM | POA: Diagnosis not present

## 2021-03-11 DIAGNOSIS — Z992 Dependence on renal dialysis: Secondary | ICD-10-CM | POA: Diagnosis not present

## 2021-03-11 DIAGNOSIS — N186 End stage renal disease: Secondary | ICD-10-CM | POA: Diagnosis not present

## 2021-03-14 ENCOUNTER — Telehealth: Payer: Self-pay

## 2021-03-14 DIAGNOSIS — N2581 Secondary hyperparathyroidism of renal origin: Secondary | ICD-10-CM | POA: Diagnosis not present

## 2021-03-14 DIAGNOSIS — Z992 Dependence on renal dialysis: Secondary | ICD-10-CM | POA: Diagnosis not present

## 2021-03-14 DIAGNOSIS — N186 End stage renal disease: Secondary | ICD-10-CM | POA: Diagnosis not present

## 2021-03-14 NOTE — Telephone Encounter (Signed)
Home health plan of care signed by provider and faxed back to [facility] health at [phone]. Plan of care placed in home health folder at front. ?

## 2021-03-16 DIAGNOSIS — N186 End stage renal disease: Secondary | ICD-10-CM | POA: Diagnosis not present

## 2021-03-16 DIAGNOSIS — N2581 Secondary hyperparathyroidism of renal origin: Secondary | ICD-10-CM | POA: Diagnosis not present

## 2021-03-16 DIAGNOSIS — Z992 Dependence on renal dialysis: Secondary | ICD-10-CM | POA: Diagnosis not present

## 2021-03-18 DIAGNOSIS — Z992 Dependence on renal dialysis: Secondary | ICD-10-CM | POA: Diagnosis not present

## 2021-03-18 DIAGNOSIS — N186 End stage renal disease: Secondary | ICD-10-CM | POA: Diagnosis not present

## 2021-03-18 DIAGNOSIS — N2581 Secondary hyperparathyroidism of renal origin: Secondary | ICD-10-CM | POA: Diagnosis not present

## 2021-03-21 DIAGNOSIS — Z992 Dependence on renal dialysis: Secondary | ICD-10-CM | POA: Diagnosis not present

## 2021-03-21 DIAGNOSIS — N2581 Secondary hyperparathyroidism of renal origin: Secondary | ICD-10-CM | POA: Diagnosis not present

## 2021-03-21 DIAGNOSIS — N186 End stage renal disease: Secondary | ICD-10-CM | POA: Diagnosis not present

## 2021-03-22 DIAGNOSIS — I10 Essential (primary) hypertension: Secondary | ICD-10-CM | POA: Diagnosis not present

## 2021-03-22 DIAGNOSIS — I70219 Atherosclerosis of native arteries of extremities with intermittent claudication, unspecified extremity: Secondary | ICD-10-CM | POA: Diagnosis not present

## 2021-03-22 DIAGNOSIS — I700 Atherosclerosis of aorta: Secondary | ICD-10-CM | POA: Diagnosis not present

## 2021-03-22 DIAGNOSIS — I6523 Occlusion and stenosis of bilateral carotid arteries: Secondary | ICD-10-CM | POA: Diagnosis not present

## 2021-03-22 DIAGNOSIS — I25118 Atherosclerotic heart disease of native coronary artery with other forms of angina pectoris: Secondary | ICD-10-CM | POA: Diagnosis not present

## 2021-03-23 DIAGNOSIS — N2581 Secondary hyperparathyroidism of renal origin: Secondary | ICD-10-CM | POA: Diagnosis not present

## 2021-03-23 DIAGNOSIS — Z992 Dependence on renal dialysis: Secondary | ICD-10-CM | POA: Diagnosis not present

## 2021-03-23 DIAGNOSIS — N186 End stage renal disease: Secondary | ICD-10-CM | POA: Diagnosis not present

## 2021-03-24 ENCOUNTER — Telehealth: Payer: Self-pay

## 2021-03-24 ENCOUNTER — Ambulatory Visit: Payer: Medicare HMO | Admitting: Physician Assistant

## 2021-03-24 NOTE — Telephone Encounter (Signed)
Plan of care signed by provider and faxed back to [facility] at [phone]. Order held at front in home health folder. ?

## 2021-03-25 DIAGNOSIS — N186 End stage renal disease: Secondary | ICD-10-CM | POA: Diagnosis not present

## 2021-03-25 DIAGNOSIS — N2581 Secondary hyperparathyroidism of renal origin: Secondary | ICD-10-CM | POA: Diagnosis not present

## 2021-03-25 DIAGNOSIS — Z992 Dependence on renal dialysis: Secondary | ICD-10-CM | POA: Diagnosis not present

## 2021-03-25 IMAGING — CR DG CHEST 2V
1 series · 2 of 2 positions shown · non-contrast
Comparison: Single-view of the chest 07/17/2008 and 02/21/2007.

CLINICAL DATA: Chest pain and shortness of breath for 3-4 weeks.

EXAM:
CHEST - 2 VIEW

[Series 1: dg chest 2 view · 0.14mm/px · 2 of 2 slices shown]
[im 1/2]
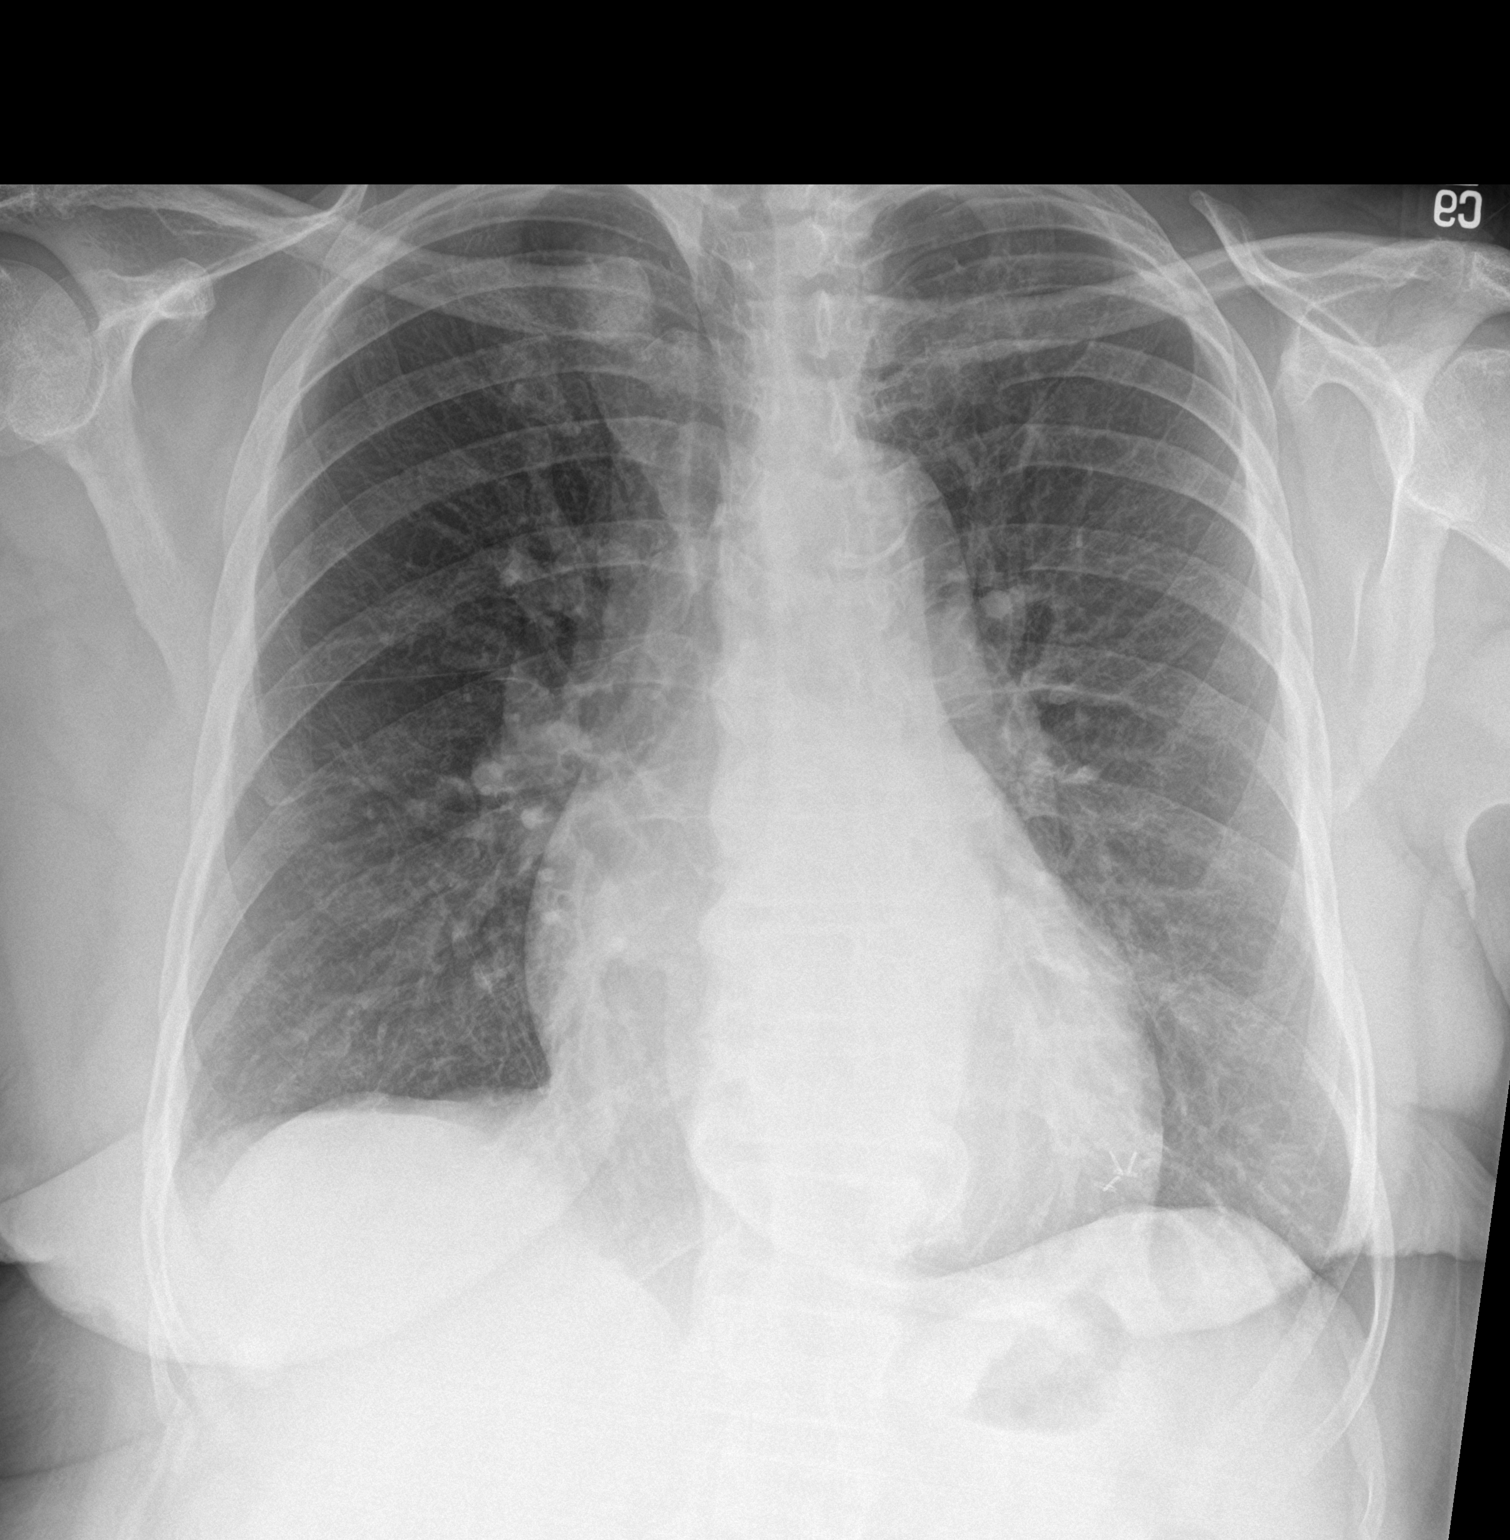
[im 2/2]
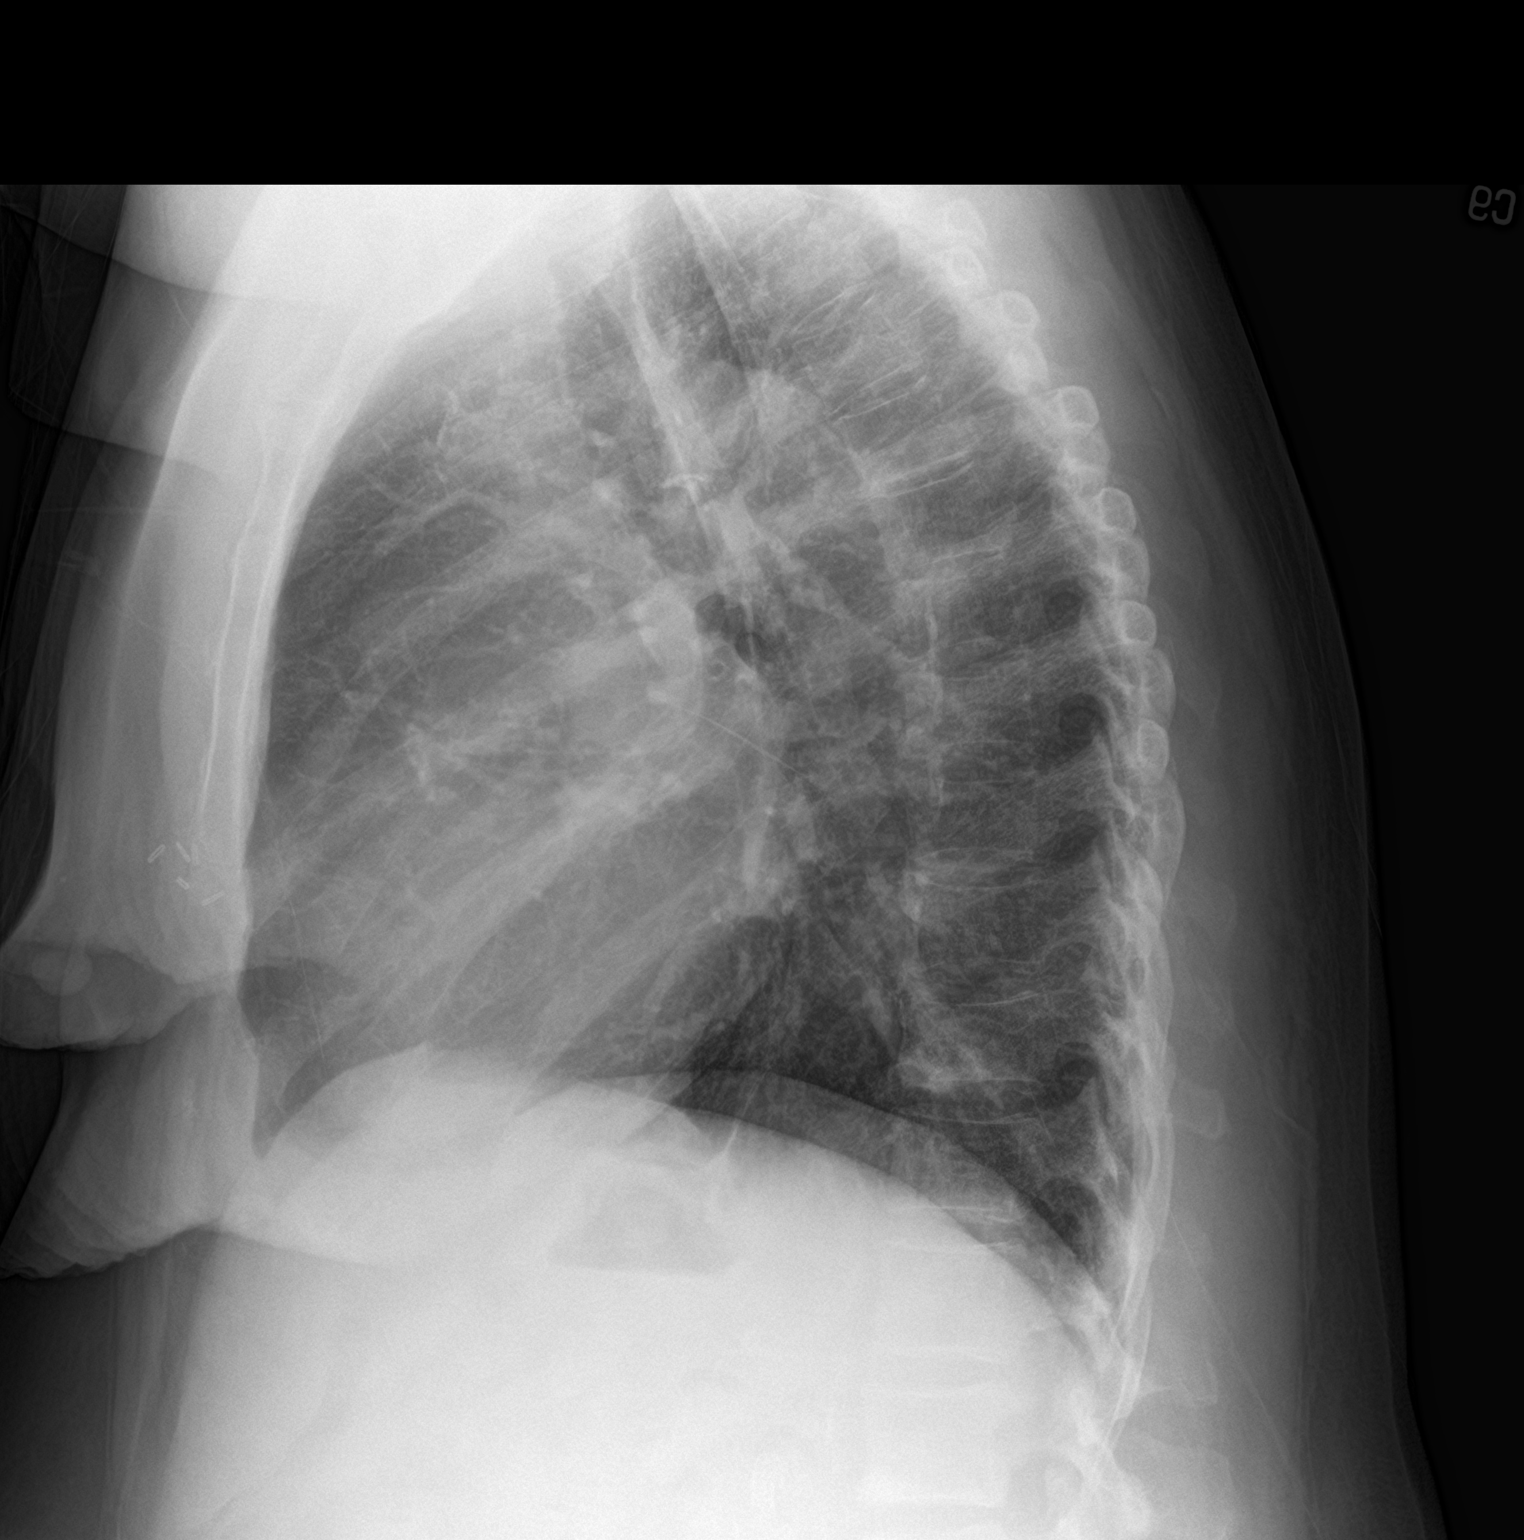

[2 of 2 positions shown; findings below may reference images not displayed]

FINDINGS: Lungs are clear. Heart size is upper normal. No pneumothorax or
pleural effusion. Atherosclerosis noted. No acute or focal bony
abnormality.
IMPRESSION: No acute disease.

Atherosclerosis.

## 2021-03-27 ENCOUNTER — Other Ambulatory Visit: Payer: Self-pay

## 2021-03-27 NOTE — Patient Outreach (Signed)
[facility]) Care Management ? ?03/27/2021 ? ?[name] ?1933-03-08 ?[id number] ? ? ?Telephone Assessment ? ? ?Unsuccessful outreach attempt to patient.  ? ? ? ? ?Plan: ?RN CM will make outreach attempt to patient within the month of May. ? ? ?Enzo Montgomery, RN,BSN,CCM ?[facility] Care Management ?Telephonic Care Management Coordinator ?Direct Phone: [phone] ?Toll Free: [phone] ?Fax: [phone] ? ?

## 2021-03-28 DIAGNOSIS — N186 End stage renal disease: Secondary | ICD-10-CM | POA: Diagnosis not present

## 2021-03-28 DIAGNOSIS — Z992 Dependence on renal dialysis: Secondary | ICD-10-CM | POA: Diagnosis not present

## 2021-03-28 DIAGNOSIS — N2581 Secondary hyperparathyroidism of renal origin: Secondary | ICD-10-CM | POA: Diagnosis not present

## 2021-03-29 DIAGNOSIS — H52223 Regular astigmatism, bilateral: Secondary | ICD-10-CM | POA: Diagnosis not present

## 2021-03-29 DIAGNOSIS — Z961 Presence of intraocular lens: Secondary | ICD-10-CM | POA: Diagnosis not present

## 2021-03-29 DIAGNOSIS — Z0100 Encounter for examination of eyes and vision without abnormal findings: Secondary | ICD-10-CM | POA: Diagnosis not present

## 2021-03-29 DIAGNOSIS — E119 Type 2 diabetes mellitus without complications: Secondary | ICD-10-CM | POA: Diagnosis not present

## 2021-03-29 DIAGNOSIS — H5203 Hypermetropia, bilateral: Secondary | ICD-10-CM | POA: Diagnosis not present

## 2021-03-29 DIAGNOSIS — H04123 Dry eye syndrome of bilateral lacrimal glands: Secondary | ICD-10-CM | POA: Diagnosis not present

## 2021-03-29 DIAGNOSIS — Z7984 Long term (current) use of oral hypoglycemic drugs: Secondary | ICD-10-CM | POA: Diagnosis not present

## 2021-03-29 DIAGNOSIS — H524 Presbyopia: Secondary | ICD-10-CM | POA: Diagnosis not present

## 2021-03-30 DIAGNOSIS — N186 End stage renal disease: Secondary | ICD-10-CM | POA: Diagnosis not present

## 2021-03-30 DIAGNOSIS — N2581 Secondary hyperparathyroidism of renal origin: Secondary | ICD-10-CM | POA: Diagnosis not present

## 2021-03-30 DIAGNOSIS — Z992 Dependence on renal dialysis: Secondary | ICD-10-CM | POA: Diagnosis not present

## 2021-04-01 DIAGNOSIS — N2581 Secondary hyperparathyroidism of renal origin: Secondary | ICD-10-CM | POA: Diagnosis not present

## 2021-04-01 DIAGNOSIS — N186 End stage renal disease: Secondary | ICD-10-CM | POA: Diagnosis not present

## 2021-04-01 DIAGNOSIS — Z992 Dependence on renal dialysis: Secondary | ICD-10-CM | POA: Diagnosis not present

## 2021-04-03 ENCOUNTER — Encounter: Payer: Self-pay | Admitting: Internal Medicine

## 2021-04-04 DIAGNOSIS — Z992 Dependence on renal dialysis: Secondary | ICD-10-CM | POA: Diagnosis not present

## 2021-04-04 DIAGNOSIS — N2581 Secondary hyperparathyroidism of renal origin: Secondary | ICD-10-CM | POA: Diagnosis not present

## 2021-04-04 DIAGNOSIS — N186 End stage renal disease: Secondary | ICD-10-CM | POA: Diagnosis not present

## 2021-04-06 DIAGNOSIS — N2581 Secondary hyperparathyroidism of renal origin: Secondary | ICD-10-CM | POA: Diagnosis not present

## 2021-04-06 DIAGNOSIS — N186 End stage renal disease: Secondary | ICD-10-CM | POA: Diagnosis not present

## 2021-04-06 DIAGNOSIS — Z992 Dependence on renal dialysis: Secondary | ICD-10-CM | POA: Diagnosis not present

## 2021-04-07 DIAGNOSIS — Z992 Dependence on renal dialysis: Secondary | ICD-10-CM | POA: Diagnosis not present

## 2021-04-07 DIAGNOSIS — N186 End stage renal disease: Secondary | ICD-10-CM | POA: Diagnosis not present

## 2021-04-08 DIAGNOSIS — N2581 Secondary hyperparathyroidism of renal origin: Secondary | ICD-10-CM | POA: Diagnosis not present

## 2021-04-08 DIAGNOSIS — Z992 Dependence on renal dialysis: Secondary | ICD-10-CM | POA: Diagnosis not present

## 2021-04-08 DIAGNOSIS — N186 End stage renal disease: Secondary | ICD-10-CM | POA: Diagnosis not present

## 2021-04-11 DIAGNOSIS — N2581 Secondary hyperparathyroidism of renal origin: Secondary | ICD-10-CM | POA: Diagnosis not present

## 2021-04-11 DIAGNOSIS — Z992 Dependence on renal dialysis: Secondary | ICD-10-CM | POA: Diagnosis not present

## 2021-04-11 DIAGNOSIS — N186 End stage renal disease: Secondary | ICD-10-CM | POA: Diagnosis not present

## 2021-04-13 DIAGNOSIS — Z992 Dependence on renal dialysis: Secondary | ICD-10-CM | POA: Diagnosis not present

## 2021-04-13 DIAGNOSIS — N186 End stage renal disease: Secondary | ICD-10-CM | POA: Diagnosis not present

## 2021-04-13 DIAGNOSIS — N2581 Secondary hyperparathyroidism of renal origin: Secondary | ICD-10-CM | POA: Diagnosis not present

## 2021-04-15 DIAGNOSIS — Z992 Dependence on renal dialysis: Secondary | ICD-10-CM | POA: Diagnosis not present

## 2021-04-15 DIAGNOSIS — N186 End stage renal disease: Secondary | ICD-10-CM | POA: Diagnosis not present

## 2021-04-15 DIAGNOSIS — N2581 Secondary hyperparathyroidism of renal origin: Secondary | ICD-10-CM | POA: Diagnosis not present

## 2021-04-18 DIAGNOSIS — N186 End stage renal disease: Secondary | ICD-10-CM | POA: Diagnosis not present

## 2021-04-18 DIAGNOSIS — N2581 Secondary hyperparathyroidism of renal origin: Secondary | ICD-10-CM | POA: Diagnosis not present

## 2021-04-18 DIAGNOSIS — Z992 Dependence on renal dialysis: Secondary | ICD-10-CM | POA: Diagnosis not present

## 2021-04-20 DIAGNOSIS — N186 End stage renal disease: Secondary | ICD-10-CM | POA: Diagnosis not present

## 2021-04-20 DIAGNOSIS — N2581 Secondary hyperparathyroidism of renal origin: Secondary | ICD-10-CM | POA: Diagnosis not present

## 2021-04-20 DIAGNOSIS — Z992 Dependence on renal dialysis: Secondary | ICD-10-CM | POA: Diagnosis not present

## 2021-04-22 DIAGNOSIS — N2581 Secondary hyperparathyroidism of renal origin: Secondary | ICD-10-CM | POA: Diagnosis not present

## 2021-04-22 DIAGNOSIS — Z992 Dependence on renal dialysis: Secondary | ICD-10-CM | POA: Diagnosis not present

## 2021-04-22 DIAGNOSIS — N186 End stage renal disease: Secondary | ICD-10-CM | POA: Diagnosis not present

## 2021-04-25 DIAGNOSIS — N186 End stage renal disease: Secondary | ICD-10-CM | POA: Diagnosis not present

## 2021-04-25 DIAGNOSIS — Z992 Dependence on renal dialysis: Secondary | ICD-10-CM | POA: Diagnosis not present

## 2021-04-25 DIAGNOSIS — N2581 Secondary hyperparathyroidism of renal origin: Secondary | ICD-10-CM | POA: Diagnosis not present

## 2021-04-26 DIAGNOSIS — Z794 Long term (current) use of insulin: Secondary | ICD-10-CM | POA: Diagnosis not present

## 2021-04-26 DIAGNOSIS — E1140 Type 2 diabetes mellitus with diabetic neuropathy, unspecified: Secondary | ICD-10-CM | POA: Diagnosis not present

## 2021-04-26 DIAGNOSIS — L97422 Non-pressure chronic ulcer of left heel and midfoot with fat layer exposed: Secondary | ICD-10-CM | POA: Diagnosis not present

## 2021-04-27 DIAGNOSIS — N186 End stage renal disease: Secondary | ICD-10-CM | POA: Diagnosis not present

## 2021-04-27 DIAGNOSIS — N2581 Secondary hyperparathyroidism of renal origin: Secondary | ICD-10-CM | POA: Diagnosis not present

## 2021-04-27 DIAGNOSIS — Z992 Dependence on renal dialysis: Secondary | ICD-10-CM | POA: Diagnosis not present

## 2021-04-29 DIAGNOSIS — N186 End stage renal disease: Secondary | ICD-10-CM | POA: Diagnosis not present

## 2021-04-29 DIAGNOSIS — Z992 Dependence on renal dialysis: Secondary | ICD-10-CM | POA: Diagnosis not present

## 2021-04-29 DIAGNOSIS — N2581 Secondary hyperparathyroidism of renal origin: Secondary | ICD-10-CM | POA: Diagnosis not present

## 2021-05-02 DIAGNOSIS — N2581 Secondary hyperparathyroidism of renal origin: Secondary | ICD-10-CM | POA: Diagnosis not present

## 2021-05-02 DIAGNOSIS — Z992 Dependence on renal dialysis: Secondary | ICD-10-CM | POA: Diagnosis not present

## 2021-05-02 DIAGNOSIS — N186 End stage renal disease: Secondary | ICD-10-CM | POA: Diagnosis not present

## 2021-05-04 DIAGNOSIS — Z992 Dependence on renal dialysis: Secondary | ICD-10-CM | POA: Diagnosis not present

## 2021-05-04 DIAGNOSIS — N2581 Secondary hyperparathyroidism of renal origin: Secondary | ICD-10-CM | POA: Diagnosis not present

## 2021-05-04 DIAGNOSIS — N186 End stage renal disease: Secondary | ICD-10-CM | POA: Diagnosis not present

## 2021-05-05 ENCOUNTER — Ambulatory Visit: Payer: Medicare HMO | Admitting: Physician Assistant

## 2021-05-06 DIAGNOSIS — N186 End stage renal disease: Secondary | ICD-10-CM | POA: Diagnosis not present

## 2021-05-06 DIAGNOSIS — N2581 Secondary hyperparathyroidism of renal origin: Secondary | ICD-10-CM | POA: Diagnosis not present

## 2021-05-06 DIAGNOSIS — Z992 Dependence on renal dialysis: Secondary | ICD-10-CM | POA: Diagnosis not present

## 2021-05-07 DIAGNOSIS — Z992 Dependence on renal dialysis: Secondary | ICD-10-CM | POA: Diagnosis not present

## 2021-05-07 DIAGNOSIS — N186 End stage renal disease: Secondary | ICD-10-CM | POA: Diagnosis not present

## 2021-05-09 DIAGNOSIS — N186 End stage renal disease: Secondary | ICD-10-CM | POA: Diagnosis not present

## 2021-05-09 DIAGNOSIS — N2581 Secondary hyperparathyroidism of renal origin: Secondary | ICD-10-CM | POA: Diagnosis not present

## 2021-05-09 DIAGNOSIS — Z992 Dependence on renal dialysis: Secondary | ICD-10-CM | POA: Diagnosis not present

## 2021-05-11 DIAGNOSIS — Z992 Dependence on renal dialysis: Secondary | ICD-10-CM | POA: Diagnosis not present

## 2021-05-11 DIAGNOSIS — N186 End stage renal disease: Secondary | ICD-10-CM | POA: Diagnosis not present

## 2021-05-11 DIAGNOSIS — N2581 Secondary hyperparathyroidism of renal origin: Secondary | ICD-10-CM | POA: Diagnosis not present

## 2021-05-13 DIAGNOSIS — N186 End stage renal disease: Secondary | ICD-10-CM | POA: Diagnosis not present

## 2021-05-13 DIAGNOSIS — N2581 Secondary hyperparathyroidism of renal origin: Secondary | ICD-10-CM | POA: Diagnosis not present

## 2021-05-13 DIAGNOSIS — Z992 Dependence on renal dialysis: Secondary | ICD-10-CM | POA: Diagnosis not present

## 2021-05-16 DIAGNOSIS — N2581 Secondary hyperparathyroidism of renal origin: Secondary | ICD-10-CM | POA: Diagnosis not present

## 2021-05-16 DIAGNOSIS — Z992 Dependence on renal dialysis: Secondary | ICD-10-CM | POA: Diagnosis not present

## 2021-05-16 DIAGNOSIS — N186 End stage renal disease: Secondary | ICD-10-CM | POA: Diagnosis not present

## 2021-05-18 DIAGNOSIS — N2581 Secondary hyperparathyroidism of renal origin: Secondary | ICD-10-CM | POA: Diagnosis not present

## 2021-05-18 DIAGNOSIS — N186 End stage renal disease: Secondary | ICD-10-CM | POA: Diagnosis not present

## 2021-05-18 DIAGNOSIS — Z992 Dependence on renal dialysis: Secondary | ICD-10-CM | POA: Diagnosis not present

## 2021-05-20 DIAGNOSIS — N2581 Secondary hyperparathyroidism of renal origin: Secondary | ICD-10-CM | POA: Diagnosis not present

## 2021-05-20 DIAGNOSIS — N186 End stage renal disease: Secondary | ICD-10-CM | POA: Diagnosis not present

## 2021-05-20 DIAGNOSIS — Z992 Dependence on renal dialysis: Secondary | ICD-10-CM | POA: Diagnosis not present

## 2021-05-23 DIAGNOSIS — N186 End stage renal disease: Secondary | ICD-10-CM | POA: Diagnosis not present

## 2021-05-23 DIAGNOSIS — N2581 Secondary hyperparathyroidism of renal origin: Secondary | ICD-10-CM | POA: Diagnosis not present

## 2021-05-23 DIAGNOSIS — Z992 Dependence on renal dialysis: Secondary | ICD-10-CM | POA: Diagnosis not present

## 2021-05-24 ENCOUNTER — Other Ambulatory Visit: Payer: Self-pay

## 2021-05-24 ENCOUNTER — Telehealth: Payer: Self-pay

## 2021-05-24 ENCOUNTER — Other Ambulatory Visit: Payer: Self-pay | Admitting: Physician Assistant

## 2021-05-24 NOTE — Telephone Encounter (Signed)
Home health plan of care signed by provider and faxed back to [facility] at [phone]. Signed order held at the front desk in home health folder. ?

## 2021-05-24 NOTE — Patient Outreach (Signed)
[place] [facility]) Care Management ? ?05/24/2021 ? ?Jennifer Carrillo ?1933-06-25 ?[id number] ? ? ?Telephone Assessment ? ? ? ?Outreach attempt to patient. Spoke with patient who reported nurse was in the home performing wound care and requested call back later today. ? ? ? ? ?Plan: ?RN CM will make outreach call back to patient per her request.  ? ?Enzo Montgomery, RN,BSN,CCM ?[facility] Care Management ?Telephonic Care Management Coordinator ?Direct Phone: [phone] ?Toll Free: [phone] ?Fax: [phone] ? ?

## 2021-05-24 NOTE — Patient Outreach (Signed)
[facility]) Care Management ? ?05/24/2021 ? ?Jennifer Carrillo ?02-15-1933 ?[id number] ? ? ?Telephone Assessment ? ? ?Return call placed to patient er her previous request. She reports she is going fairly well. She continues to reside in her home along with spouse. Her adult children assist her with her medical care needs as needed. No recent falls. Appetite remains good. Blood sugars stable per pt report. She is adhering to HD scheduled 3x/k. Patient reports daughter fills med planner and she doe snot know what meds she takes. Patient complains of some leg weakness and pain. She is taking Tylenol. Assessed for Gabapentin usage as listed on med list. Patient not familiar with med and does not recall taking a "capsule 3x/day." She is unsure of next PCP appt. Advised to call and make appt and follow up with daughter regarding med.  ? ? ? ?Medications Reviewed Today   ? ? Reviewed by Hayden Pedro, RN (Registered Nurse) on 05/24/21 at 1216  Med List Status: <None>  ? ?Medication Order Taking? Sig Documenting Provider Last Dose Status Informant  ?ACCU-CHEK GUIDE test strip [id number] No TEST BLOOD SUGAR THREE TIMES DAILY  AS  DIRECTED [name], MD Taking Active Child  ?Accu-Chek Softclix Lancets lancets [id number] No USE AS INSTRUCTED 3 TIMES A DAY [name], MD Taking Active Child  ?acetaminophen (TYLENOL) 500 MG tablet [id number] No Take 1 tablet (500 mg total) by mouth every 6 (six) hours as needed for moderate pain or headache. Fritzi Mandes, MD Taking Active   ?Alcohol Swabs (B-D SINGLE USE SWABS REGULAR) PADS [id number] No Use as directed for 3 times daily DX E11.65 Kendell Bane, NP Taking Active Child  ?amLODipine (NORVASC) 10 MG tablet [id number] No Take 1 tablet (10 mg total) by mouth daily at breakfast [name], MD Taking Active Child  ?anastrozole (ARIMIDEX) 1 MG tablet [id number] No Take 1 tablet (1 mg total) by mouth daily; afternoon (dinner) [name], MD Taking  Active Child  ?atorvastatin (LIPITOR) 40 MG tablet [id number] No Take 1 tablet by mouth daily. [provider] Taking Active Child  ?Blood Glucose Monitoring Suppl (ACCU-CHEK GUIDE ME) w/Device KIT [id number] No Use as instructed. DX e11.65 Kendell Bane, NP Taking Active Child  ?cholecalciferol (VITAMIN D) 25 MCG (1000 UNIT) tablet [id number] No TAKE 1 TABLET EVERY MONDAY, WEDNESDAY, AND FRIDAY. Luiz Ochoa, NP Taking Active Child  ?clopidogrel (PLAVIX) 75 MG tablet [id number] No TAKE 1 TABLET EVERY DAY AFTERNOON (DINNER) McDonough, Lauren K, PA-C Taking Active Child  ?docusate sodium (COLACE) 100 MG capsule [id number] No Take 200 mg by mouth at bedtime as needed for mild constipation. [provider] Taking Active Child  ?donepezil (ARICEPT) 10 MG tablet [id number] No TAKE 1 TABLET BY MOUTH AT BEDTIME FOR MEMORY (NEED MD APPOINTMENT) McDonough, Si Gaul, PA-C Taking Active Child  ?fluticasone-salmeterol (ADVAIR) 100-50 MCG/ACT AEPB [id number] No Inhale 1 puff into the lungs 2 (two) times daily.  ?Patient taking differently: Inhale 1 puff into the lungs 2 (two) times daily as needed.  ? [name], MD Taking Active Child, Self  ?         ?Med Note Eula Flax Dec 18, 2020 10:57 PM) 08/15/2020 100-50 MCG/ACT AEPB (disp 60, 30d supply)   ?gabapentin (NEURONTIN) 100 MG capsule [id number] No Take 1 capsule (100 mg total) by mouth 3 (three) times daily. Mylinda Latina, PA-C Taking Active Child  ?hydrALAZINE (APRESOLINE) 50 MG tablet [id number]  No Take 100 mg by mouth in the morning and at bedtime. [provider] Taking Active Self, Child  ?insulin NPH-regular Human (70-30) 100 UNIT/ML injection [id number] No Inject 12 Units into the skin 2 (two) times daily. Inject 20 units subcutaneously in the morning and 20 units in the evening. Fritzi Mandes, MD Taking Active   ?isosorbide mononitrate (IMDUR) 60 MG 24 hr tablet [id number] No Take 60 mg by mouth daily. [provider]  Taking Active Child  ?ranolazine (RANEXA) 500 MG 12 hr tablet [id number] No Take 500 mg by mouth 2 (two) times daily; breakfast, Afternoon (dinner) [name], MD Taking Active Child  ?sulfamethoxazole-trimethoprim (BACTRIM DS) 800-160 MG tablet [id number]  Take 1 tablet by mouth 2 (two) times daily. McDonough, Si Gaul, PA-C  Active   ? ?  ?  ? ?  ?  ? ? ?Care Plan : RN Care Manager POC  ?Updates made by Hayden Pedro, RN since 05/24/2021 12:00 AM  ?  ? ?Problem: Care Coordination Needs and Ongoing Disease Mgmt Education and Support of Chronic Conditions-ESRD,DM,pressure wounds   ?Priority: High  ?  ? ?Long-Range Goal: Development of POC for Mgmt of Chronic Condition-ESRD   ?Start Date: 01/06/2021  ?Expected End Date: 01/06/2022  ?This Visit's Progress: On track  ?Recent Progress: On track  ?Priority: High  ?Note:   ? ?Current Barriers:  ?Knowledge Deficits related to plan of care for management of ESRD  ? ?RNCM Clinical Goal(s):  ?Patient will verbalize understanding of plan for management of ESRD as evidenced by mgmt of chronic conditions ?demonstrate Ongoing adherence to prescribed treatment plan for ESRD as evidenced by adherence to HD txs ?continue to work with RN Care Manager to address care management and care coordination needs related to  ESRD as evidenced by adherence to CM Team Scheduled appointments through collaboration with RN Care manager, provider, and care team.  ? ?Interventions: ?POC sent to PCP upon initial assessment, quarterly and with any changes in patient's conditions ?Inter-disciplinary care team collaboration (see longitudinal plan of care) ?Evaluation of current treatment plan related to  self management and patient's adherence to plan as established by provider ? ? ?ESRD  (Status:  Goal on track:  Yes.)  Long Term Goal ?Evaluation of current treatment plan related to ESRD,  self-management and patient's adherence to plan as established by provider. ?Discussed plans with  patient for ongoing care management follow up and provided patient with direct contact information for care management team ?Evaluation of current treatment plan related to ESRD and patient's adherence to plan as established by provider ?Provided education to patient re: ESRD, diet, what to expect with HD txs ?Reviewed medications with patient and discussed importance of adherence ?01/30/21-Patient reports that she is adhering to H tx schedule. She denies any SEs or issues related to txs. ?05/24/21-Patient continues to tolerate and adhere to HD schedule.  ? ?Patient Goals/Self-Care Activities: ?Take all medications as prescribed ?Attend all scheduled provider appointments ?Call provider office for new concerns or questions  ?Adhere to HD treatment plan ? ?Follow Up Plan:  Telephone follow up appointment with care management team member scheduled for:  within the month of July ?The patient has been provided with contact information for the care management team and has been advised to call with any health related questions or concerns.   ?  ? ?Long-Range Goal: Development of POC for Mgmt of Chronic Condition-DM   ?Start Date: 01/06/2021  ?Expected End Date: 01/06/2022  ?This Visit's Progress:  On track  ?Recent Progress: On track  ?Priority: High  ?Note:   ? ?Current Barriers:  ?Chronic Disease Management support and education needs related to DMII  ? ?RNCM Clinical Goal(s):  ?Patient will take all medications exactly as prescribed and will call provider for medication related questions as evidenced by mgmt of chronic condition ?demonstrate Ongoing health management independence as evidenced by A1C level within normal parameters ?continue to work with RN Care Manager to address care management and care coordination needs related to  DMII as evidenced by adherence to CM Team Scheduled appointments through collaboration with RN Care manager, provider, and care team.  ? ?Interventions: ?POC sent to PCP upon initial  assessment, quarterly and with any changes in patient's conditions ?Inter-disciplinary care team collaboration (see longitudinal plan of care) ?Evaluation of current treatment plan related to  self management an

## 2021-05-25 DIAGNOSIS — N186 End stage renal disease: Secondary | ICD-10-CM | POA: Diagnosis not present

## 2021-05-25 DIAGNOSIS — Z992 Dependence on renal dialysis: Secondary | ICD-10-CM | POA: Diagnosis not present

## 2021-05-25 DIAGNOSIS — N2581 Secondary hyperparathyroidism of renal origin: Secondary | ICD-10-CM | POA: Diagnosis not present

## 2021-05-26 ENCOUNTER — Other Ambulatory Visit: Payer: Self-pay | Admitting: Physician Assistant

## 2021-05-26 DIAGNOSIS — L851 Acquired keratosis [keratoderma] palmaris et plantaris: Secondary | ICD-10-CM | POA: Diagnosis not present

## 2021-05-26 DIAGNOSIS — E1140 Type 2 diabetes mellitus with diabetic neuropathy, unspecified: Secondary | ICD-10-CM | POA: Diagnosis not present

## 2021-05-26 DIAGNOSIS — L97422 Non-pressure chronic ulcer of left heel and midfoot with fat layer exposed: Secondary | ICD-10-CM | POA: Diagnosis not present

## 2021-05-26 DIAGNOSIS — B351 Tinea unguium: Secondary | ICD-10-CM | POA: Diagnosis not present

## 2021-05-26 DIAGNOSIS — I70203 Unspecified atherosclerosis of native arteries of extremities, bilateral legs: Secondary | ICD-10-CM

## 2021-05-26 DIAGNOSIS — Z794 Long term (current) use of insulin: Secondary | ICD-10-CM | POA: Diagnosis not present

## 2021-05-27 DIAGNOSIS — N2581 Secondary hyperparathyroidism of renal origin: Secondary | ICD-10-CM | POA: Diagnosis not present

## 2021-05-27 DIAGNOSIS — Z992 Dependence on renal dialysis: Secondary | ICD-10-CM | POA: Diagnosis not present

## 2021-05-27 DIAGNOSIS — N186 End stage renal disease: Secondary | ICD-10-CM | POA: Diagnosis not present

## 2021-05-29 ENCOUNTER — Encounter: Payer: Self-pay | Admitting: Physician Assistant

## 2021-05-29 ENCOUNTER — Ambulatory Visit (INDEPENDENT_AMBULATORY_CARE_PROVIDER_SITE_OTHER): Payer: Medicare HMO | Admitting: Physician Assistant

## 2021-05-29 DIAGNOSIS — Z992 Dependence on renal dialysis: Secondary | ICD-10-CM | POA: Diagnosis not present

## 2021-05-29 DIAGNOSIS — L97422 Non-pressure chronic ulcer of left heel and midfoot with fat layer exposed: Secondary | ICD-10-CM | POA: Diagnosis not present

## 2021-05-29 DIAGNOSIS — N186 End stage renal disease: Secondary | ICD-10-CM | POA: Diagnosis not present

## 2021-05-29 DIAGNOSIS — Z794 Long term (current) use of insulin: Secondary | ICD-10-CM

## 2021-05-29 DIAGNOSIS — E1122 Type 2 diabetes mellitus with diabetic chronic kidney disease: Secondary | ICD-10-CM | POA: Diagnosis not present

## 2021-05-29 DIAGNOSIS — I10 Essential (primary) hypertension: Secondary | ICD-10-CM

## 2021-05-29 LAB — POCT GLYCOSYLATED HEMOGLOBIN (HGB A1C): Hemoglobin A1C: 5.2 % (ref 4.0–5.6)

## 2021-05-29 NOTE — Progress Notes (Unsigned)
[facility] [place], [place] [address]  Internal MEDICINE  Office Visit Note  Patient Name: Jennifer Carrillo  [id number]  [id number]  Date of Service: 05/31/2021     Chief Complaint  Patient presents with   Hospitalization Follow-up   Diabetes   Gastroesophageal Reflux   Hypertension   Hyperlipidemia   Quality Metric Gaps    Shingles and Pneumonia Vaccines     HPI Pt initially scheduled as a hospital follow up however patient states that she has not been hospitalized recently and no recent admission encounter visible in chart. It appears her home health care ordered after hospitalization a few months ago has expired though and may be what prompted visit. -Sees Dr. Cleda Mccreedy for podiatry for left foot ulcer and very recent follow up. It is bandaged in office today. Home health was coming out and help with wound care in between these visits and patient would benefit from this continuing. -Does dialysis T, Thurs, Sat. Followed by Dr. Candiss Norse -Has not seen endocrinology in a long time and unsure exactly why she has not had any follow up as she continues to be on insulin. -BG 122 this morning. Discussed checking A1c in office if patient not followed with endocrinology anymore. May need to look into this. A1c in office was found ot be low at 5.2 therefore will start decreasing her insulin. She was doing 12 units BID and will drop to 10units BID and monitor sugars. If low may need to drop further in future. -BP at home has been stable, has not taken BP meds yet today which is why it is elevated in office. -Does need continued home health care with wound care for pressure ulcer. Would also benefit from checking vital signs and helping with medication management and checking sugars. Patient requires assistance with ADLs and uses walker and rollator for going out and at home  Current Medication: Outpatient Encounter Medications as of 05/29/2021  Medication Sig Note    ACCU-CHEK GUIDE test strip TEST BLOOD SUGAR THREE TIMES DAILY  AS  DIRECTED    Accu-Chek Softclix Lancets lancets USE AS INSTRUCTED 3 TIMES A DAY    acetaminophen (TYLENOL) 500 MG tablet Take 1 tablet (500 mg total) by mouth every 6 (six) hours as needed for moderate pain or headache.    Alcohol Swabs (B-D SINGLE USE SWABS REGULAR) PADS Use as directed for 3 times daily DX E11.65    amLODipine (NORVASC) 10 MG tablet Take 1 tablet (10 mg total) by mouth daily at breakfast    anastrozole (ARIMIDEX) 1 MG tablet Take 1 tablet (1 mg total) by mouth daily; afternoon (dinner)    atorvastatin (LIPITOR) 40 MG tablet Take 1 tablet by mouth daily.    Blood Glucose Monitoring Suppl (ACCU-CHEK GUIDE ME) w/Device KIT Use as instructed. DX e11.65    cholecalciferol (VITAMIN D) 25 MCG (1000 UNIT) tablet TAKE 1 TABLET EVERY MONDAY, WEDNESDAY, AND FRIDAY.    clopidogrel (PLAVIX) 75 MG tablet TAKE 1 TABLET DAILY AT DINNER    docusate sodium (COLACE) 100 MG capsule Take 200 mg by mouth at bedtime as needed for mild constipation.    donepezil (ARICEPT) 10 MG tablet TAKE 1 TABLET AT BEDTIME FOR MEMORY (NEED MD APPOINTMENT)    fluticasone-salmeterol (ADVAIR) 100-50 MCG/ACT AEPB Inhale 1 puff into the lungs 2 (two) times daily. (Patient taking differently: Inhale 1 puff into the lungs 2 (two) times daily as needed.) 12/18/2020: 08/15/2020 100-50 MCG/ACT AEPB (disp 60, 30d supply)  gabapentin (NEURONTIN) 100 MG capsule Take 1 capsule (100 mg total) by mouth 3 (three) times daily.    hydrALAZINE (APRESOLINE) 50 MG tablet Take 100 mg by mouth in the morning and at bedtime.    insulin NPH-regular Human (70-30) 100 UNIT/ML injection Inject 12 Units into the skin 2 (two) times daily. Inject 20 units subcutaneously in the morning and 20 units in the evening.    isosorbide mononitrate (IMDUR) 60 MG 24 hr tablet Take 60 mg by mouth daily.    ranolazine (RANEXA) 500 MG 12 hr tablet Take 500 mg by mouth 2 (two) times daily;  breakfast, Afternoon (dinner)    sulfamethoxazole-trimethoprim (BACTRIM DS) 800-160 MG tablet Take 1 tablet by mouth 2 (two) times daily.    No facility-administered encounter medications on file as of 05/29/2021.    Surgical History: Past Surgical History:  Procedure Laterality Date   BREAST SURGERY Left    left lumpectomy   CAROTID ANGIOGRAPHY Left 06/25/2018   Procedure: CAROTID ANGIOGRAPHY, possible intervention;  Surgeon: Katha Cabal, MD;  Location: [facility];  Service: Cardiovascular;  Laterality: Left;   CAROTID ENDARTERECTOMY Right    CAROTID PTA/STENT INTERVENTION Left 06/25/2018   Procedure: CAROTID PTA/STENT INTERVENTION;  Surgeon: Katha Cabal, MD;  Location: [facility];  Service: Cardiovascular;  Laterality: Left;   CATARACT EXTRACTION Right 2013   CORONARY STENT PLACEMENT     L. L. E.   DIALYSIS/PERMA CATHETER INSERTION N/A 12/29/2020   Procedure: DIALYSIS/PERMA CATHETER INSERTION;  Surgeon: Algernon Huxley, MD;  Location: [facility];  Service: Cardiovascular;  Laterality: N/A;   EYE SURGERY Bilateral    cataract   LEFT HEART CATH AND CORONARY ANGIOGRAPHY Left 05/06/2018   Procedure: LEFT HEART CATH AND CORONARY ANGIOGRAPHY;  Surgeon: Corey Skains, MD;  Location: [facility];  Service: Cardiovascular;  Laterality: Left;   LOWER EXTREMITY ANGIOGRAPHY Right 01/22/2018   Procedure: LOWER EXTREMITY ANGIOGRAPHY;  Surgeon: Katha Cabal, MD;  Location: [facility];  Service: Cardiovascular;  Laterality: Right;   RENAL ANGIOGRAPHY Left 02/26/2018   Procedure: RENAL ANGIOGRAPHY;  Surgeon: Katha Cabal, MD;  Location: [facility];  Service: Cardiovascular;  Laterality: Left;   STENT PLACEMENT VASCULAR ([facility] HX) Left    stent placement on LLE   TUBAL LIGATION      Medical History: Past Medical History:  Diagnosis Date   Arthritis    Asthma    Calf pain    Cancer ([place]) 2016   left breast   COPD  (chronic obstructive pulmonary disease) (HCC)    Diabetes (HCC)    Discoid lupus    Gastro-esophageal reflux    Glaucoma    Hyperlipemia    Hypertension    Iron deficiency anemia    Joint pain    Leg swelling    Osteoarthritis    PVD (peripheral vascular disease) ([place])    Sinus problem    Sleep apnea    No CPAP/ Can't tolerate   Stroke ([place]) 1987    Family History: Family History  Problem Relation Age of Onset   Heart disease Mother    Diabetes Other    Hypertension Other    Diabetes Sister    Lung cancer Brother    Leukemia Daughter    Bladder Cancer Neg Hx    Kidney disease Neg Hx    Prostate cancer Neg Hx     Social History   Socioeconomic History  Marital status: Married    Spouse name: Not on file   Number of children: Not on file   Years of education: Not on file   Highest education level: Not on file  Occupational History   Not on file  Tobacco Use   Smoking status: Former    Types: Cigarettes    Quit date: 28    Years since quitting: 36.4   Smokeless tobacco: Never   Tobacco comments:    quit 31 years  Vaping Use   Vaping Use: Never used  Substance and Sexual Activity   Alcohol use: No   Drug use: No   Sexual activity: Not on file  Other Topics Concern   Not on file  Social History Narrative   Walks with a rolling walker; remote hx of smoking; no alcohol; [place]- with husband/son. Daughter lives in [place].    Social Determinants of Health   Financial Resource Strain: Low Risk    Difficulty of Paying Living Expenses: Not very hard  Food Insecurity: No Food Insecurity   Worried About Charity fundraiser in the Last Year: Never true   Ran Out of Food in the Last Year: Never true  Transportation Needs: No Transportation Needs   Lack of Transportation (Medical): No   Lack of Transportation (Non-Medical): No  Physical Activity: Not on file  Stress: Not on file  Social Connections: Not on file  Intimate Partner Violence: Not on file       Review of Systems  Constitutional:  Negative for chills, fatigue and unexpected weight change.  HENT:  Negative for congestion, postnasal drip, rhinorrhea, sneezing and sore throat.   Eyes:  Negative for redness.  Respiratory:  Negative for cough, chest tightness and shortness of breath.   Cardiovascular:  Negative for chest pain and palpitations.  Gastrointestinal:  Negative for abdominal pain, constipation, diarrhea, nausea and vomiting.  Genitourinary:  Negative for dysuria and frequency.  Musculoskeletal:  Positive for arthralgias and gait problem. Negative for back pain, joint swelling and neck pain.  Skin:  Positive for wound. Negative for rash.       Pressure wound of left heel  Neurological:  Negative for tremors and numbness.  Hematological:  Negative for adenopathy. Does not bruise/bleed easily.  Psychiatric/Behavioral:  Negative for behavioral problems (Depression), sleep disturbance and suicidal ideas. The patient is not nervous/anxious.    Vital Signs: BP (!) 154/82   Pulse 66   Temp 97.6 F (36.4 C)   Resp 16   Ht '5\' 7"'  (1.702 m)   Wt 171 lb 9.6 oz (77.8 kg)   SpO2 99%   BMI 26.88 kg/m    Physical Exam Constitutional:      General: She is not in acute distress.    Appearance: She is well-developed. She is not diaphoretic.  HENT:     Head: Normocephalic and atraumatic.     Mouth/Throat:     Pharynx: No oropharyngeal exudate.  Eyes:     Pupils: Pupils are equal, round, and reactive to light.  Neck:     Thyroid: No thyromegaly.     Vascular: No JVD.     Trachea: No tracheal deviation.  Cardiovascular:     Rate and Rhythm: Normal rate and regular rhythm.     Heart sounds: Normal heart sounds. No murmur heard.   No friction rub. No gallop.  Pulmonary:     Effort: Pulmonary effort is normal. No respiratory distress.     Breath sounds: No wheezing or  rales.  Chest:     Chest wall: No tenderness.  Abdominal:     General: Bowel sounds are normal.      Palpations: Abdomen is soft.  Musculoskeletal:        General: Normal range of motion.     Cervical back: Normal range of motion and neck supple.  Lymphadenopathy:     Cervical: No cervical adenopathy.  Skin:    General: Skin is warm and dry.     Comments: bandage in place on left heel over pressure ulcer  Neurological:     Mental Status: She is alert and oriented to person, place, and time.     Cranial Nerves: No cranial nerve deficit.     Gait: Gait abnormal.     Comments: Using walker  Psychiatric:        Behavior: Behavior normal.        Thought Content: Thought content normal.        Judgment: Judgment normal.      Assessment/Plan: 1. Skin ulcer of left heel with fat layer exposed (Home) Followed by podiatry. Needs home health to help with wound care at home. - Ambulatory referral to Home Health  2. Type 2 diabetes mellitus with chronic kidney disease on chronic dialysis, with long-term current use of insulin (HCC) - POCT HgB A1C is 5.2 today and patient has not seen endocrinology in a long time. Will have her decrease insulin to 10units BID and will continue to taper pending response. Would benefit from help checking sugars and managing medications - Ambulatory referral to [facility]  3. Primary hypertension Elevated in office since she has not taken her medication yet today and will do so now. Would benefit from help checking BP at home and managing medications - Ambulatory referral to [facility]  4. ESRD (end stage renal disease) ([facility]) Followed by nephrology - Ambulatory referral to [facility]: [facility] understanding of the findings of todays visit and agrees with plan of treatment. I have discussed any further diagnostic evaluation that may be needed or ordered today. We also reviewed her medications today. she has been encouraged to call the office with any questions or concerns that should arise related to todays  visit.    Counseling:    Orders Placed This Encounter  Procedures   Ambulatory referral to [facility]   POCT HgB A1C    This patient was seen by Drema Dallas, PA-C in collaboration with Dr. Clayborn Bigness as a part of collaborative care agreement.   I have reviewed all medical records from hospital follow up including radiology reports and consults from other physicians. Appropriate follow up diagnostics will be scheduled as needed. Patient/ Family understands the plan of treatment. Time spent 40 minutes.   Dr [name], MD Internal Medicine

## 2021-05-30 DIAGNOSIS — Z992 Dependence on renal dialysis: Secondary | ICD-10-CM | POA: Diagnosis not present

## 2021-05-30 DIAGNOSIS — N2581 Secondary hyperparathyroidism of renal origin: Secondary | ICD-10-CM | POA: Diagnosis not present

## 2021-05-30 DIAGNOSIS — N186 End stage renal disease: Secondary | ICD-10-CM | POA: Diagnosis not present

## 2021-06-01 ENCOUNTER — Telehealth: Payer: Self-pay

## 2021-06-01 DIAGNOSIS — N186 End stage renal disease: Secondary | ICD-10-CM | POA: Diagnosis not present

## 2021-06-01 DIAGNOSIS — N2581 Secondary hyperparathyroidism of renal origin: Secondary | ICD-10-CM | POA: Diagnosis not present

## 2021-06-01 DIAGNOSIS — Z992 Dependence on renal dialysis: Secondary | ICD-10-CM | POA: Diagnosis not present

## 2021-06-01 NOTE — Telephone Encounter (Signed)
Send to [facility] that home health order placed please review and let us know

## 2021-06-01 NOTE — Telephone Encounter (Signed)
[facility] accept home health  its delay for 5 days and pt husband aware

## 2021-06-03 DIAGNOSIS — N2581 Secondary hyperparathyroidism of renal origin: Secondary | ICD-10-CM | POA: Diagnosis not present

## 2021-06-03 DIAGNOSIS — Z992 Dependence on renal dialysis: Secondary | ICD-10-CM | POA: Diagnosis not present

## 2021-06-03 DIAGNOSIS — N186 End stage renal disease: Secondary | ICD-10-CM | POA: Diagnosis not present

## 2021-06-06 DIAGNOSIS — N186 End stage renal disease: Secondary | ICD-10-CM | POA: Diagnosis not present

## 2021-06-06 DIAGNOSIS — N2581 Secondary hyperparathyroidism of renal origin: Secondary | ICD-10-CM | POA: Diagnosis not present

## 2021-06-06 DIAGNOSIS — Z992 Dependence on renal dialysis: Secondary | ICD-10-CM | POA: Diagnosis not present

## 2021-06-07 ENCOUNTER — Telehealth: Payer: Self-pay

## 2021-06-07 DIAGNOSIS — N186 End stage renal disease: Secondary | ICD-10-CM | POA: Diagnosis not present

## 2021-06-07 DIAGNOSIS — Z992 Dependence on renal dialysis: Secondary | ICD-10-CM | POA: Diagnosis not present

## 2021-06-07 NOTE — Telephone Encounter (Signed)
Gave verbal order to [facility] [phone] nursing once a week for 1 week,twice a week for 4 weeks,once a week for 4 week and 1 prn

## 2021-06-08 ENCOUNTER — Telehealth: Payer: Self-pay

## 2021-06-08 DIAGNOSIS — Z992 Dependence on renal dialysis: Secondary | ICD-10-CM | POA: Diagnosis not present

## 2021-06-08 DIAGNOSIS — N186 End stage renal disease: Secondary | ICD-10-CM | POA: Diagnosis not present

## 2021-06-08 DIAGNOSIS — N2581 Secondary hyperparathyroidism of renal origin: Secondary | ICD-10-CM | POA: Diagnosis not present

## 2021-06-08 NOTE — Telephone Encounter (Signed)
Gave verbal order  to [facility] physical therapist PHU [phone] for 2 times a week for 4 weeks and 1 times a week for 4 week

## 2021-06-10 DIAGNOSIS — Z992 Dependence on renal dialysis: Secondary | ICD-10-CM | POA: Diagnosis not present

## 2021-06-10 DIAGNOSIS — N186 End stage renal disease: Secondary | ICD-10-CM | POA: Diagnosis not present

## 2021-06-10 DIAGNOSIS — N2581 Secondary hyperparathyroidism of renal origin: Secondary | ICD-10-CM | POA: Diagnosis not present

## 2021-06-13 DIAGNOSIS — N186 End stage renal disease: Secondary | ICD-10-CM | POA: Diagnosis not present

## 2021-06-13 DIAGNOSIS — N2581 Secondary hyperparathyroidism of renal origin: Secondary | ICD-10-CM | POA: Diagnosis not present

## 2021-06-13 DIAGNOSIS — Z992 Dependence on renal dialysis: Secondary | ICD-10-CM | POA: Diagnosis not present

## 2021-06-14 ENCOUNTER — Other Ambulatory Visit: Payer: Self-pay | Admitting: Internal Medicine

## 2021-06-14 DIAGNOSIS — I10 Essential (primary) hypertension: Secondary | ICD-10-CM

## 2021-06-15 DIAGNOSIS — N2581 Secondary hyperparathyroidism of renal origin: Secondary | ICD-10-CM | POA: Diagnosis not present

## 2021-06-15 DIAGNOSIS — N186 End stage renal disease: Secondary | ICD-10-CM | POA: Diagnosis not present

## 2021-06-15 DIAGNOSIS — Z992 Dependence on renal dialysis: Secondary | ICD-10-CM | POA: Diagnosis not present

## 2021-06-17 DIAGNOSIS — N2581 Secondary hyperparathyroidism of renal origin: Secondary | ICD-10-CM | POA: Diagnosis not present

## 2021-06-17 DIAGNOSIS — N186 End stage renal disease: Secondary | ICD-10-CM | POA: Diagnosis not present

## 2021-06-17 DIAGNOSIS — Z992 Dependence on renal dialysis: Secondary | ICD-10-CM | POA: Diagnosis not present

## 2021-06-20 DIAGNOSIS — N186 End stage renal disease: Secondary | ICD-10-CM | POA: Diagnosis not present

## 2021-06-20 DIAGNOSIS — Z992 Dependence on renal dialysis: Secondary | ICD-10-CM | POA: Diagnosis not present

## 2021-06-20 DIAGNOSIS — N2581 Secondary hyperparathyroidism of renal origin: Secondary | ICD-10-CM | POA: Diagnosis not present

## 2021-06-22 DIAGNOSIS — Z992 Dependence on renal dialysis: Secondary | ICD-10-CM | POA: Diagnosis not present

## 2021-06-22 DIAGNOSIS — N186 End stage renal disease: Secondary | ICD-10-CM | POA: Diagnosis not present

## 2021-06-22 DIAGNOSIS — N2581 Secondary hyperparathyroidism of renal origin: Secondary | ICD-10-CM | POA: Diagnosis not present

## 2021-06-24 DIAGNOSIS — Z992 Dependence on renal dialysis: Secondary | ICD-10-CM | POA: Diagnosis not present

## 2021-06-24 DIAGNOSIS — N186 End stage renal disease: Secondary | ICD-10-CM | POA: Diagnosis not present

## 2021-06-24 DIAGNOSIS — N2581 Secondary hyperparathyroidism of renal origin: Secondary | ICD-10-CM | POA: Diagnosis not present

## 2021-06-27 ENCOUNTER — Telehealth: Payer: Self-pay

## 2021-06-27 DIAGNOSIS — N186 End stage renal disease: Secondary | ICD-10-CM | POA: Diagnosis not present

## 2021-06-27 DIAGNOSIS — Z992 Dependence on renal dialysis: Secondary | ICD-10-CM | POA: Diagnosis not present

## 2021-06-27 DIAGNOSIS — N2581 Secondary hyperparathyroidism of renal origin: Secondary | ICD-10-CM | POA: Diagnosis not present

## 2021-06-27 NOTE — Telephone Encounter (Signed)
Gave verbal order to [facility]  [phone] for Occupational therapy once a week for 5 week

## 2021-06-29 DIAGNOSIS — N186 End stage renal disease: Secondary | ICD-10-CM | POA: Diagnosis not present

## 2021-06-29 DIAGNOSIS — Z992 Dependence on renal dialysis: Secondary | ICD-10-CM | POA: Diagnosis not present

## 2021-06-29 DIAGNOSIS — N2581 Secondary hyperparathyroidism of renal origin: Secondary | ICD-10-CM | POA: Diagnosis not present

## 2021-07-01 DIAGNOSIS — N2581 Secondary hyperparathyroidism of renal origin: Secondary | ICD-10-CM | POA: Diagnosis not present

## 2021-07-01 DIAGNOSIS — Z992 Dependence on renal dialysis: Secondary | ICD-10-CM | POA: Diagnosis not present

## 2021-07-01 DIAGNOSIS — N186 End stage renal disease: Secondary | ICD-10-CM | POA: Diagnosis not present

## 2021-07-04 DIAGNOSIS — J45909 Unspecified asthma, uncomplicated: Secondary | ICD-10-CM | POA: Insufficient documentation

## 2021-07-04 DIAGNOSIS — I252 Old myocardial infarction: Secondary | ICD-10-CM | POA: Diagnosis not present

## 2021-07-04 DIAGNOSIS — E1122 Type 2 diabetes mellitus with diabetic chronic kidney disease: Secondary | ICD-10-CM | POA: Diagnosis not present

## 2021-07-04 DIAGNOSIS — I1311 Hypertensive heart and chronic kidney disease without heart failure, with stage 5 chronic kidney disease, or end stage renal disease: Secondary | ICD-10-CM | POA: Insufficient documentation

## 2021-07-04 DIAGNOSIS — N2581 Secondary hyperparathyroidism of renal origin: Secondary | ICD-10-CM | POA: Diagnosis not present

## 2021-07-04 DIAGNOSIS — D849 Immunodeficiency, unspecified: Secondary | ICD-10-CM | POA: Insufficient documentation

## 2021-07-04 DIAGNOSIS — L930 Discoid lupus erythematosus: Secondary | ICD-10-CM | POA: Insufficient documentation

## 2021-07-04 DIAGNOSIS — E119 Type 2 diabetes mellitus without complications: Secondary | ICD-10-CM | POA: Insufficient documentation

## 2021-07-04 DIAGNOSIS — Z95828 Presence of other vascular implants and grafts: Secondary | ICD-10-CM | POA: Insufficient documentation

## 2021-07-04 DIAGNOSIS — N186 End stage renal disease: Secondary | ICD-10-CM | POA: Diagnosis not present

## 2021-07-04 DIAGNOSIS — C50919 Malignant neoplasm of unspecified site of unspecified female breast: Secondary | ICD-10-CM | POA: Insufficient documentation

## 2021-07-04 DIAGNOSIS — Z992 Dependence on renal dialysis: Secondary | ICD-10-CM | POA: Diagnosis not present

## 2021-07-04 DIAGNOSIS — I25118 Atherosclerotic heart disease of native coronary artery with other forms of angina pectoris: Secondary | ICD-10-CM | POA: Insufficient documentation

## 2021-07-04 DIAGNOSIS — L89620 Pressure ulcer of left heel, unstageable: Secondary | ICD-10-CM | POA: Diagnosis not present

## 2021-07-04 DIAGNOSIS — E1142 Type 2 diabetes mellitus with diabetic polyneuropathy: Secondary | ICD-10-CM | POA: Insufficient documentation

## 2021-07-04 DIAGNOSIS — D8481 Immunodeficiency due to conditions classified elsewhere: Secondary | ICD-10-CM | POA: Diagnosis not present

## 2021-07-04 DIAGNOSIS — K219 Gastro-esophageal reflux disease without esophagitis: Secondary | ICD-10-CM | POA: Insufficient documentation

## 2021-07-04 DIAGNOSIS — G4733 Obstructive sleep apnea (adult) (pediatric): Secondary | ICD-10-CM | POA: Insufficient documentation

## 2021-07-06 DIAGNOSIS — Z992 Dependence on renal dialysis: Secondary | ICD-10-CM | POA: Diagnosis not present

## 2021-07-06 DIAGNOSIS — N2581 Secondary hyperparathyroidism of renal origin: Secondary | ICD-10-CM | POA: Diagnosis not present

## 2021-07-06 DIAGNOSIS — N186 End stage renal disease: Secondary | ICD-10-CM | POA: Diagnosis not present

## 2021-07-07 DIAGNOSIS — N186 End stage renal disease: Secondary | ICD-10-CM | POA: Diagnosis not present

## 2021-07-07 DIAGNOSIS — Z992 Dependence on renal dialysis: Secondary | ICD-10-CM | POA: Diagnosis not present

## 2021-07-08 DIAGNOSIS — Z992 Dependence on renal dialysis: Secondary | ICD-10-CM | POA: Diagnosis not present

## 2021-07-08 DIAGNOSIS — N186 End stage renal disease: Secondary | ICD-10-CM | POA: Diagnosis not present

## 2021-07-08 DIAGNOSIS — N2581 Secondary hyperparathyroidism of renal origin: Secondary | ICD-10-CM | POA: Diagnosis not present

## 2021-07-11 DIAGNOSIS — Z992 Dependence on renal dialysis: Secondary | ICD-10-CM | POA: Diagnosis not present

## 2021-07-11 DIAGNOSIS — N186 End stage renal disease: Secondary | ICD-10-CM | POA: Diagnosis not present

## 2021-07-11 DIAGNOSIS — N2581 Secondary hyperparathyroidism of renal origin: Secondary | ICD-10-CM | POA: Diagnosis not present

## 2021-07-13 DIAGNOSIS — Z992 Dependence on renal dialysis: Secondary | ICD-10-CM | POA: Diagnosis not present

## 2021-07-13 DIAGNOSIS — N2581 Secondary hyperparathyroidism of renal origin: Secondary | ICD-10-CM | POA: Diagnosis not present

## 2021-07-13 DIAGNOSIS — N186 End stage renal disease: Secondary | ICD-10-CM | POA: Diagnosis not present

## 2021-07-15 DIAGNOSIS — N186 End stage renal disease: Secondary | ICD-10-CM | POA: Diagnosis not present

## 2021-07-15 DIAGNOSIS — N2581 Secondary hyperparathyroidism of renal origin: Secondary | ICD-10-CM | POA: Diagnosis not present

## 2021-07-15 DIAGNOSIS — Z992 Dependence on renal dialysis: Secondary | ICD-10-CM | POA: Diagnosis not present

## 2021-07-18 DIAGNOSIS — Z992 Dependence on renal dialysis: Secondary | ICD-10-CM | POA: Diagnosis not present

## 2021-07-18 DIAGNOSIS — N186 End stage renal disease: Secondary | ICD-10-CM | POA: Diagnosis not present

## 2021-07-18 DIAGNOSIS — N2581 Secondary hyperparathyroidism of renal origin: Secondary | ICD-10-CM | POA: Diagnosis not present

## 2021-07-20 DIAGNOSIS — N186 End stage renal disease: Secondary | ICD-10-CM | POA: Diagnosis not present

## 2021-07-20 DIAGNOSIS — N2581 Secondary hyperparathyroidism of renal origin: Secondary | ICD-10-CM | POA: Diagnosis not present

## 2021-07-20 DIAGNOSIS — Z992 Dependence on renal dialysis: Secondary | ICD-10-CM | POA: Diagnosis not present

## 2021-07-22 DIAGNOSIS — Z992 Dependence on renal dialysis: Secondary | ICD-10-CM | POA: Diagnosis not present

## 2021-07-22 DIAGNOSIS — N2581 Secondary hyperparathyroidism of renal origin: Secondary | ICD-10-CM | POA: Diagnosis not present

## 2021-07-22 DIAGNOSIS — N186 End stage renal disease: Secondary | ICD-10-CM | POA: Diagnosis not present

## 2021-07-25 DIAGNOSIS — Z992 Dependence on renal dialysis: Secondary | ICD-10-CM | POA: Diagnosis not present

## 2021-07-25 DIAGNOSIS — N2581 Secondary hyperparathyroidism of renal origin: Secondary | ICD-10-CM | POA: Diagnosis not present

## 2021-07-25 DIAGNOSIS — N186 End stage renal disease: Secondary | ICD-10-CM | POA: Diagnosis not present

## 2021-07-27 DIAGNOSIS — N186 End stage renal disease: Secondary | ICD-10-CM | POA: Diagnosis not present

## 2021-07-27 DIAGNOSIS — N2581 Secondary hyperparathyroidism of renal origin: Secondary | ICD-10-CM | POA: Diagnosis not present

## 2021-07-27 DIAGNOSIS — Z992 Dependence on renal dialysis: Secondary | ICD-10-CM | POA: Diagnosis not present

## 2021-07-29 DIAGNOSIS — N186 End stage renal disease: Secondary | ICD-10-CM | POA: Diagnosis not present

## 2021-07-29 DIAGNOSIS — Z992 Dependence on renal dialysis: Secondary | ICD-10-CM | POA: Diagnosis not present

## 2021-07-29 DIAGNOSIS — N2581 Secondary hyperparathyroidism of renal origin: Secondary | ICD-10-CM | POA: Diagnosis not present

## 2021-07-31 ENCOUNTER — Telehealth: Payer: Self-pay

## 2021-07-31 NOTE — Telephone Encounter (Signed)
Jennifer Carrillo [phone]) called requesting verbal orders for PT 1 x week for 9 weeks.  Gave verbal approval for PT.

## 2021-08-01 ENCOUNTER — Other Ambulatory Visit: Payer: Self-pay

## 2021-08-01 DIAGNOSIS — N2581 Secondary hyperparathyroidism of renal origin: Secondary | ICD-10-CM | POA: Diagnosis not present

## 2021-08-01 DIAGNOSIS — N186 End stage renal disease: Secondary | ICD-10-CM | POA: Diagnosis not present

## 2021-08-01 DIAGNOSIS — Z992 Dependence on renal dialysis: Secondary | ICD-10-CM | POA: Diagnosis not present

## 2021-08-01 NOTE — Patient Outreach (Signed)
[facility]) Care Carrillo  08/01/2021  Jennifer Carrillo 1933/09/22 [id number]   Telephone Assessment   Outreach attempt to patient. Spoke with spouse who reported patient was currently at dialysis.      Plan: RN CM will make outreach attempt to patient within the month of Aug.  [facility] Telephonic Care [facility]illo Coordinator Direct Phone: [phone] Toll Free: [phone] Fax: [phone]

## 2021-08-03 DIAGNOSIS — N2581 Secondary hyperparathyroidism of renal origin: Secondary | ICD-10-CM | POA: Diagnosis not present

## 2021-08-03 DIAGNOSIS — N186 End stage renal disease: Secondary | ICD-10-CM | POA: Diagnosis not present

## 2021-08-03 DIAGNOSIS — Z992 Dependence on renal dialysis: Secondary | ICD-10-CM | POA: Diagnosis not present

## 2021-08-05 DIAGNOSIS — Z992 Dependence on renal dialysis: Secondary | ICD-10-CM | POA: Diagnosis not present

## 2021-08-05 DIAGNOSIS — N186 End stage renal disease: Secondary | ICD-10-CM | POA: Diagnosis not present

## 2021-08-05 DIAGNOSIS — N2581 Secondary hyperparathyroidism of renal origin: Secondary | ICD-10-CM | POA: Diagnosis not present

## 2021-08-07 DIAGNOSIS — Z992 Dependence on renal dialysis: Secondary | ICD-10-CM | POA: Diagnosis not present

## 2021-08-07 DIAGNOSIS — N186 End stage renal disease: Secondary | ICD-10-CM | POA: Diagnosis not present

## 2021-08-08 DIAGNOSIS — Z992 Dependence on renal dialysis: Secondary | ICD-10-CM | POA: Diagnosis not present

## 2021-08-08 DIAGNOSIS — N186 End stage renal disease: Secondary | ICD-10-CM | POA: Diagnosis not present

## 2021-08-08 DIAGNOSIS — N2581 Secondary hyperparathyroidism of renal origin: Secondary | ICD-10-CM | POA: Diagnosis not present

## 2021-08-10 DIAGNOSIS — Z992 Dependence on renal dialysis: Secondary | ICD-10-CM | POA: Diagnosis not present

## 2021-08-10 DIAGNOSIS — N186 End stage renal disease: Secondary | ICD-10-CM | POA: Diagnosis not present

## 2021-08-10 DIAGNOSIS — N2581 Secondary hyperparathyroidism of renal origin: Secondary | ICD-10-CM | POA: Diagnosis not present

## 2021-08-12 DIAGNOSIS — N186 End stage renal disease: Secondary | ICD-10-CM | POA: Diagnosis not present

## 2021-08-12 DIAGNOSIS — Z992 Dependence on renal dialysis: Secondary | ICD-10-CM | POA: Diagnosis not present

## 2021-08-12 DIAGNOSIS — N2581 Secondary hyperparathyroidism of renal origin: Secondary | ICD-10-CM | POA: Diagnosis not present

## 2021-08-15 DIAGNOSIS — N2581 Secondary hyperparathyroidism of renal origin: Secondary | ICD-10-CM | POA: Diagnosis not present

## 2021-08-15 DIAGNOSIS — Z992 Dependence on renal dialysis: Secondary | ICD-10-CM | POA: Diagnosis not present

## 2021-08-15 DIAGNOSIS — N186 End stage renal disease: Secondary | ICD-10-CM | POA: Diagnosis not present

## 2021-08-17 DIAGNOSIS — Z992 Dependence on renal dialysis: Secondary | ICD-10-CM | POA: Diagnosis not present

## 2021-08-17 DIAGNOSIS — N186 End stage renal disease: Secondary | ICD-10-CM | POA: Diagnosis not present

## 2021-08-17 DIAGNOSIS — N2581 Secondary hyperparathyroidism of renal origin: Secondary | ICD-10-CM | POA: Diagnosis not present

## 2021-08-19 DIAGNOSIS — Z992 Dependence on renal dialysis: Secondary | ICD-10-CM | POA: Diagnosis not present

## 2021-08-19 DIAGNOSIS — N2581 Secondary hyperparathyroidism of renal origin: Secondary | ICD-10-CM | POA: Diagnosis not present

## 2021-08-19 DIAGNOSIS — N186 End stage renal disease: Secondary | ICD-10-CM | POA: Diagnosis not present

## 2021-08-20 ENCOUNTER — Encounter: Payer: Self-pay | Admitting: Internal Medicine

## 2021-08-21 ENCOUNTER — Other Ambulatory Visit: Payer: Self-pay | Admitting: Physician Assistant

## 2021-08-21 ENCOUNTER — Other Ambulatory Visit: Payer: Self-pay

## 2021-08-21 DIAGNOSIS — Z853 Personal history of malignant neoplasm of breast: Secondary | ICD-10-CM

## 2021-08-21 DIAGNOSIS — Z1231 Encounter for screening mammogram for malignant neoplasm of breast: Secondary | ICD-10-CM

## 2021-08-21 NOTE — Telephone Encounter (Signed)
Please see

## 2021-08-21 NOTE — Patient Outreach (Addendum)
[facility]) Care Management  08/21/2021  LASHANA SPANG [birth year] [id number]   Telephone Assessment   Successful outreach call to patient. She reports she is doing fairly well. No new issues or concerns a this time. Patient pleased to report that her HD tx time has been cut don form 4 hrs to 3 hrs as she was told "her numbers and everything looked good." Patient continues e to tolerate HD txs with no SEs.    Health Maintenance/Care Gaps: -Last AWV: 03/24/2020-Patient states daughter makes appts and she will tell her daughter to make an appt -Mammogram-patient thinks it been almost two yrs-granddaughter already contacted MD office yesterday to get a referral/appt   Medications Reviewed Today     Reviewed by Hayden Pedro, RN (Registered Nurse) on 08/21/21 at [place] List Status: <None>   Medication Order Taking? Sig Documenting Provider Last Dose Status Informant  ACCU-CHEK GUIDE test strip [id number] No TEST BLOOD SUGAR THREE TIMES DAILY  AS  DIRECTED [name], MD Taking Active Child  Accu-Chek Softclix Lancets lancets [id number] No USE AS INSTRUCTED 3 TIMES A DAY [name], MD Taking Active Child  acetaminophen (TYLENOL) 500 MG tablet [id number] No Take 1 tablet (500 mg total) by mouth every 6 (six) hours as needed for moderate pain or headache. Fritzi Mandes, MD Taking Active   Alcohol Swabs (B-D SINGLE USE SWABS REGULAR) PADS [id number] No Use as directed for 3 times daily DX E11.65 Kendell Bane, NP Taking Active Child  amLODipine (NORVASC) 10 MG tablet [id number]  TAKE 1 TABLET EVERY DAY AT [facility], Timoteo Gaul, MD  Active   anastrozole (ARIMIDEX) 1 MG tablet [id number]  TAKE 1 TABLET EVERY DAY IN THE AFTERNOON (DINNER) [name], MD  Active   atorvastatin (LIPITOR) 40 MG tablet [id number] No Take 1 tablet by mouth daily. [provider] Taking Active Child  Blood Glucose Monitoring Suppl (ACCU-CHEK GUIDE ME) w/Device KIT  [id number] No Use as instructed. DX e11.65 Kendell Bane, NP Taking Active Child  cholecalciferol (VITAMIN D) 25 MCG (1000 UNIT) tablet [id number] No TAKE 1 TABLET EVERY MONDAY, WEDNESDAY, AND FRIDAY. Luiz Ochoa, NP Taking Active Child  clopidogrel (PLAVIX) 75 MG tablet [id number] No TAKE 1 TABLET DAILY AT [facility], Si Gaul, PA-C Taking Active   docusate sodium (COLACE) 100 MG capsule [id number] No Take 200 mg by mouth at bedtime as needed for mild constipation. [provider] Taking Active Child  donepezil (ARICEPT) 10 MG tablet [id number] No TAKE 1 TABLET AT BEDTIME FOR MEMORY (NEED MD APPOINTMENT) McDonough, Si Gaul, PA-C Taking Active   fluticasone-salmeterol (ADVAIR) 100-50 MCG/ACT AEPB [id number] No Inhale 1 puff into the lungs 2 (two) times daily.  Patient taking differently: Inhale 1 puff into the lungs 2 (two) times daily as needed.   [name], MD Taking Active Child, Self           Med Note Eula Flax Dec 18, 2020 10:57 PM) 08/15/2020 100-50 MCG/ACT AEPB (disp 60, 30d supply)   gabapentin (NEURONTIN) 100 MG capsule [id number] No Take 1 capsule (100 mg total) by mouth 3 (three) times daily. McDonough, Si Gaul, PA-C Taking Active Child  hydrALAZINE (APRESOLINE) 50 MG tablet [id number] No Take 100 mg by mouth in the morning and at bedtime. [provider] Taking Active Self, Child  insulin NPH-regular Human (70-30) 100 UNIT/ML injection [id number] No Inject 12 Units into the skin 2 (two) times daily.  Inject 20 units subcutaneously in the morning and 20 units in the evening. Fritzi Mandes, MD Taking Active   isosorbide mononitrate (IMDUR) 60 MG 24 hr tablet [id number] No Take 60 mg by mouth daily. [provider] Taking Active Child  ranolazine (RANEXA) 500 MG 12 hr tablet [id number] No Take 500 mg by mouth 2 (two) times daily; breakfast, Afternoon (dinner) [name], MD Taking Active Child  sulfamethoxazole-trimethoprim (BACTRIM DS) 800-160  MG tablet [id number] No Take 1 tablet by mouth 2 (two) times daily. Mylinda Latina, PA-C Taking Active              Care Plan : RN Care Manager POC  Updates made by Hayden Pedro, RN since 08/21/2021 12:00 AM     Problem: Care Coordination Needs and Ongoing Disease Mgmt Education and Support of Chronic Conditions-ESRD,DM,pressure wounds   Priority: High     Long-Range Goal: Development of POC for Mgmt of Chronic Condition-ESRD   Start Date: 01/06/2021  Expected End Date: 01/06/2022  This Visit's Progress: On track  Recent Progress: On track  Priority: High  Note:    Current Barriers:  Knowledge Deficits related to plan of care for management of ESRD   RNCM Clinical Goal(s):  Patient will verbalize understanding of plan for management of ESRD as evidenced by mgmt of chronic conditions demonstrate Ongoing adherence to prescribed treatment plan for ESRD as evidenced by adherence to HD txs continue to work with RN Care Manager to address care management and care coordination needs related to  ESRD as evidenced by adherence to CM Team Scheduled appointments through collaboration with RN Care manager, provider, and care team.   Interventions: POC sent to PCP upon initial assessment, quarterly and with any changes in patient's conditions Inter-disciplinary care team collaboration (see longitudinal plan of care) Evaluation of current treatment plan related to  self management and patient's adherence to plan as established by provider   ESRD  (Status:  Goal on track:  Yes.)  Long Term Goal Evaluation of current treatment plan related to ESRD,  self-management and patient's adherence to plan as established by provider. Discussed plans with patient for ongoing care management follow up and provided patient with direct contact information for care management team Evaluation of current treatment plan related to ESRD and patient's adherence to plan as established by  provider Provided education to patient re: ESRD, diet, what to expect with HD txs Reviewed medications with patient and discussed importance of adherence 01/30/21-Patient reports that she is adhering to H tx schedule. She denies any SEs or issues related to txs. 05/24/21-Patient continues to tolerate and adhere to HD schedule.  08/21/21-Patient states HD tx time decreased to 3hrs since she is doing so well Patient Goals/Self-Care Activities: Take all medications as prescribed Attend all scheduled provider appointments Call provider office for new concerns or questions  Adhere to HD treatment plan  Follow Up Plan:  Telephone follow up appointment with care management team member scheduled for:  within the month of Oct The patient has been provided with contact information for the care management team and has been advised to call with any health related questions or concerns.      Long-Range Goal: Development of POC for Mgmt of Chronic Condition-DM   Start Date: 01/06/2021  Expected End Date: 01/06/2022  This Visit's Progress: On track  Recent Progress: On track  Priority: High  Note:    Current Barriers:  Chronic Disease Management support and education needs related to  DMII   RNCM Clinical Goal(s):  Patient will take all medications exactly as prescribed and will call provider for medication related questions as evidenced by mgmt of chronic condition demonstrate Ongoing health management independence as evidenced by A1C level within normal parameters continue to work with RN Care Manager to address care management and care coordination needs related to  DMII as evidenced by adherence to CM Team Scheduled appointments through collaboration with RN Care manager, provider, and care team.   Interventions: POC sent to PCP upon initial assessment, quarterly and with any changes in patient's conditions Inter-disciplinary care team collaboration (see longitudinal plan of care) Evaluation of  current treatment plan related to  self management and patient's adherence to plan as established by provider   Diabetes Interventions:  (Status:  Goal on track:  Yes.) Long Term Goal Assessed patient's understanding of A1c goal: <7% Provided education to patient about basic DM disease process Reviewed medications with patient and discussed importance of medication adherence Discussed plans with patient for ongoing care management follow up and provided patient with direct contact information for care management team Lab Results  Component Value Date   HGBA1C 5.3 12/18/2020  01/30/21-Patient denies any issues at present.  05/24/21-patient reports cbgs have been normal but unable to recall actual value. Educated on cbg monitoring and healthy food choices as well as info sent to patient to review Patient Goals/Self-Care Activities: Take all medications as prescribed Call provider office for new concerns or questions  keep appointment with eye doctor check blood sugar at prescribed times: 1-2x/day check feet daily for cuts, sores or redness wash and dry feet carefully every day wear comfortable, cotton socks wear comfortable, well-fitting shoes  Follow Up Plan:  Telephone follow up appointment with care management team member scheduled for:  within the month of Oct The patient has been provided with contact information for the care management team and has been advised to call with any health related questions or concerns.      Long-Range Goal: Development of POC for Mgmt of Chronic Condition-Wounds   Start Date: 01/06/2021  Expected End Date: 01/06/2022  This Visit's Progress: On track  Recent Progress: On track  Priority: High  Note:   Current Barriers:  Knowledge Deficits related to plan of care for management of Tobacco Use   RNCM Clinical Goal(s):  Patient will continue to work with RN Care Manager to address care management and care coordination needs related to  skin breakdown  as evidenced by adherence to CM Team Scheduled appointments Ongoing support and education  through collaboration with RN Care manager, provider, and care team.   Interventions: POC sent to PCP upon initial assessment, quarterly and with any changes in patient's conditions Inter-disciplinary care team collaboration (see longitudinal plan of care) Evaluation of current treatment plan related to  self management and patient's adherence to plan as established by provider   Skin Breakdown( area to heel and buttocks)  (Status:  Goal on track:  Yes.)  Long Term Goal Evaluation of current treatment plan related to  skin breakdown areas ,  self-management and patient's adherence to plan as established by provider. Discussed plans with patient for ongoing care management follow up and provided patient with direct contact information for care management team Evaluation of current treatment plan related to skin breakdown and patient's adherence to plan as established by provider Advised patient to adhere to wound care plan as established by [facility] Provided education to patient re: s/s of infection, pressure relief  measures and when to seek medical attention  01/30/21-Patient reports area to heels has shown some slight improvement. She did show area to PCP during follow up appt and no changes to PCO made. Patient continues to keep area elevated to relieve pressure. 05/24/21-Patient reports slow would progress/healing related to co-morbidities. [place] nurse completed last visit today and family will be taking over wound care.  08/21/21-Pt. Reports slow but continued wound healing. She is now able to wear a shoe is an improvement.   Patient Goals/Self-Care Activities: Attend all scheduled provider appointments Perform wound care as directed  and follow up with MD as needed  Follow Up Plan:  Telephone follow up appointment with care management team member scheduled for:  within the month of Oct The patient has been  provided with contact information for the care management team and has been advised to call with any health related questions or concerns.       Plan: RN CM discussed with patient next outreach within the month of Oct. Patient agrees to care plan and follow up.  Enzo Montgomery, RN,BSN,CCM [place] Management Telephonic Care Management Coordinator Direct Phone: [phone] Toll Free: [phone] Fax: [phone]

## 2021-08-22 DIAGNOSIS — N2581 Secondary hyperparathyroidism of renal origin: Secondary | ICD-10-CM | POA: Diagnosis not present

## 2021-08-22 DIAGNOSIS — N186 End stage renal disease: Secondary | ICD-10-CM | POA: Diagnosis not present

## 2021-08-22 DIAGNOSIS — Z992 Dependence on renal dialysis: Secondary | ICD-10-CM | POA: Diagnosis not present

## 2021-08-23 ENCOUNTER — Inpatient Hospital Stay
Admission: RE | Admit: 2021-08-23 | Discharge: 2021-08-23 | Disposition: A | Discharge status: Home / Self Care | Payer: Self-pay | Source: Ambulatory Visit | Attending: *Deleted | Admitting: *Deleted

## 2021-08-23 ENCOUNTER — Other Ambulatory Visit: Payer: Self-pay | Admitting: *Deleted

## 2021-08-23 DIAGNOSIS — Z1231 Encounter for screening mammogram for malignant neoplasm of breast: Secondary | ICD-10-CM

## 2021-08-25 DIAGNOSIS — N186 End stage renal disease: Secondary | ICD-10-CM | POA: Diagnosis not present

## 2021-08-25 DIAGNOSIS — N2581 Secondary hyperparathyroidism of renal origin: Secondary | ICD-10-CM | POA: Diagnosis not present

## 2021-08-25 DIAGNOSIS — Z992 Dependence on renal dialysis: Secondary | ICD-10-CM | POA: Diagnosis not present

## 2021-08-26 DIAGNOSIS — N186 End stage renal disease: Secondary | ICD-10-CM | POA: Diagnosis not present

## 2021-08-26 DIAGNOSIS — N2581 Secondary hyperparathyroidism of renal origin: Secondary | ICD-10-CM | POA: Diagnosis not present

## 2021-08-26 DIAGNOSIS — Z992 Dependence on renal dialysis: Secondary | ICD-10-CM | POA: Diagnosis not present

## 2021-08-28 ENCOUNTER — Encounter: Payer: Self-pay | Admitting: Physician Assistant

## 2021-08-28 ENCOUNTER — Ambulatory Visit (INDEPENDENT_AMBULATORY_CARE_PROVIDER_SITE_OTHER): Payer: Medicare HMO | Admitting: Physician Assistant

## 2021-08-28 VITALS — BP 144/60 | HR 69 | Temp 98.3°F | Resp 16 | Ht 67.0 in | Wt 174.0 lb

## 2021-08-28 DIAGNOSIS — I10 Essential (primary) hypertension: Secondary | ICD-10-CM

## 2021-08-28 DIAGNOSIS — L97422 Non-pressure chronic ulcer of left heel and midfoot with fat layer exposed: Secondary | ICD-10-CM

## 2021-08-28 DIAGNOSIS — E1122 Type 2 diabetes mellitus with diabetic chronic kidney disease: Secondary | ICD-10-CM | POA: Diagnosis not present

## 2021-08-28 DIAGNOSIS — N186 End stage renal disease: Secondary | ICD-10-CM

## 2021-08-28 DIAGNOSIS — Z794 Long term (current) use of insulin: Secondary | ICD-10-CM

## 2021-08-28 LAB — POCT GLYCOSYLATED HEMOGLOBIN (HGB A1C): Hemoglobin A1C: 5 % (ref 4.0–5.6)

## 2021-08-28 MED ORDER — DONEPEZIL HCL 10 MG PO TABS: ORAL_TABLET | ORAL | 1 refills | Status: DC

## 2021-08-28 NOTE — Progress Notes (Signed)
[facility] [place], [place] [address]  Internal MEDICINE  Office Visit Note  Patient Name: Jennifer Carrillo  [id number]  [id number]  Date of Service: 09/06/2021  Chief Complaint  Patient presents with   Hypertension   Diabetes   Follow-up   Knee Pain    Bilateral knee pain - hard to walk    HPI Pt is here for routine follow up with her husband -Has nurse coming into home sometimes for wound care once per week around dialysis schedule to check left foot wound. Not seeing podiatry anymore since she was having nurse come out to house and appears to be healing well. -Goes to Dialysis T, Th, sat -Has been taking 10units insulin BID, will go down to 4units BID as her A1c has been in normal range. She is resistant to stopping insulin as her sugars have gone up in the past when she has tried. Discussed that she likely will come off this completely though as we don't want her sugars dropping -Sugars 110 in Am before insulin -Bp at home normally 440-347 systolic -son is living with them and helps with meds as well as cooking. Daughter is organizing meds at start of the week and she is there every other week during the daytime. They deny needing further in home care outside of wound care at this time, since family is helping. Did discuss it would be best if one of them could come to her appts to provide further information and ensure best care since she has problems with her memory.  Current Medication: Outpatient Encounter Medications as of 08/28/2021  Medication Sig Note   ACCU-CHEK GUIDE test strip TEST BLOOD SUGAR THREE TIMES DAILY  AS  DIRECTED    Accu-Chek Softclix Lancets lancets USE AS INSTRUCTED 3 TIMES A DAY    acetaminophen (TYLENOL) 500 MG tablet Take 1 tablet (500 mg total) by mouth every 6 (six) hours as needed for moderate pain or headache.    Alcohol Swabs (B-D SINGLE USE SWABS REGULAR) PADS Use as directed for 3 times daily DX E11.65    amLODipine  (NORVASC) 10 MG tablet TAKE 1 TABLET EVERY DAY AT BREAKFAST    anastrozole (ARIMIDEX) 1 MG tablet TAKE 1 TABLET EVERY DAY IN THE AFTERNOON (DINNER)    atorvastatin (LIPITOR) 40 MG tablet Take 1 tablet by mouth daily.    Blood Glucose Monitoring Suppl (ACCU-CHEK GUIDE ME) w/Device KIT Use as instructed. DX e11.65    cholecalciferol (VITAMIN D) 25 MCG (1000 UNIT) tablet TAKE 1 TABLET EVERY MONDAY, WEDNESDAY, AND FRIDAY.    clopidogrel (PLAVIX) 75 MG tablet TAKE 1 TABLET DAILY AT DINNER    docusate sodium (COLACE) 100 MG capsule Take 200 mg by mouth at bedtime as needed for mild constipation.    fluticasone-salmeterol (ADVAIR) 100-50 MCG/ACT AEPB Inhale 1 puff into the lungs 2 (two) times daily. (Patient taking differently: Inhale 1 puff into the lungs 2 (two) times daily as needed.) 12/18/2020: 08/15/2020 100-50 MCG/ACT AEPB (disp 60, 30d supply)    gabapentin (NEURONTIN) 100 MG capsule Take 1 capsule (100 mg total) by mouth 3 (three) times daily.    hydrALAZINE (APRESOLINE) 50 MG tablet Take 100 mg by mouth in the morning and at bedtime.    insulin NPH-regular Human (70-30) 100 UNIT/ML injection Inject 12 Units into the skin 2 (two) times daily. Inject 20 units subcutaneously in the morning and 20 units in the evening.    isosorbide mononitrate (IMDUR) 60 MG 24  hr tablet Take 60 mg by mouth daily.    ranolazine (RANEXA) 500 MG 12 hr tablet Take 500 mg by mouth 2 (two) times daily; breakfast, Afternoon (dinner)    [DISCONTINUED] donepezil (ARICEPT) 10 MG tablet TAKE 1 TABLET AT BEDTIME FOR MEMORY (NEED MD APPOINTMENT)    [DISCONTINUED] sulfamethoxazole-trimethoprim (BACTRIM DS) 800-160 MG tablet Take 1 tablet by mouth 2 (two) times daily.    donepezil (ARICEPT) 10 MG tablet TAKE 1 TABLET AT BEDTIME FOR MEMORY (NEED MD APPOINTMENT)    No facility-administered encounter medications on file as of 08/28/2021.    Surgical History: Past Surgical History:  Procedure Laterality Date   BREAST SURGERY Left     left lumpectomy   CAROTID ANGIOGRAPHY Left 06/25/2018   Procedure: CAROTID ANGIOGRAPHY, possible intervention;  Surgeon: Katha Cabal, MD;  Location: [place] [facility];  Service: Cardiovascular;  Laterality: Left;   CAROTID ENDARTERECTOMY Right    CAROTID PTA/STENT INTERVENTION Left 06/25/2018   Procedure: CAROTID PTA/STENT INTERVENTION;  Surgeon: Katha Cabal, MD;  Location: [place] [facility];  Service: Cardiovascular;  Laterality: Left;   CATARACT EXTRACTION Right 2013   CORONARY STENT PLACEMENT     L. L. E.   DIALYSIS/PERMA CATHETER INSERTION N/A 12/29/2020   Procedure: DIALYSIS/PERMA CATHETER INSERTION;  Surgeon: Algernon Huxley, MD;  Location: [place] [facility];  Service: Cardiovascular;  Laterality: N/A;   EYE SURGERY Bilateral    cataract   LEFT HEART CATH AND CORONARY ANGIOGRAPHY Left 05/06/2018   Procedure: LEFT HEART CATH AND CORONARY ANGIOGRAPHY;  Surgeon: Corey Skains, MD;  Location: [place] [facility];  Service: Cardiovascular;  Laterality: Left;   LOWER EXTREMITY ANGIOGRAPHY Right 01/22/2018   Procedure: LOWER EXTREMITY ANGIOGRAPHY;  Surgeon: Katha Cabal, MD;  Location: [place] [facility];  Service: Cardiovascular;  Laterality: Right;   RENAL ANGIOGRAPHY Left 02/26/2018   Procedure: RENAL ANGIOGRAPHY;  Surgeon: Katha Cabal, MD;  Location: [facility];  Service: Cardiovascular;  Laterality: Left;   STENT PLACEMENT VASCULAR ([facility] HX) Left    stent placement on LLE   TUBAL LIGATION      Medical History: Past Medical History:  Diagnosis Date   Arthritis    Asthma    Calf pain    Cancer ([place]) 2016   left breast   COPD (chronic obstructive pulmonary disease) (HCC)    Diabetes (HCC)    Discoid lupus    Gastro-esophageal reflux    Glaucoma    Hyperlipemia    Hypertension    Iron deficiency anemia    Joint pain    Leg swelling    Osteoarthritis    PVD (peripheral vascular disease) (HCC)    Sinus problem    Sleep apnea     No CPAP/ Can't tolerate   Stroke ([place]) 1987    Family History: Family History  Problem Relation Age of Onset   Heart disease Mother    Diabetes Other    Hypertension Other    Diabetes Sister    Lung cancer Brother    Leukemia Daughter    Bladder Cancer Neg Hx    Kidney disease Neg Hx    Prostate cancer Neg Hx     Social History   Socioeconomic History   Marital status: Married    Spouse name: Not on file   Number of children: Not on file   Years of education: Not on file   Highest education level: Not on file  Occupational History  Not on file  Tobacco Use   Smoking status: Former    Types: Cigarettes    Quit date: 1987    Years since quitting: 36.6   Smokeless tobacco: Never   Tobacco comments:    quit 31 years  Vaping Use   Vaping Use: Never used  Substance and Sexual Activity   Alcohol use: No   Drug use: No   Sexual activity: Not on file  Other Topics Concern   Not on file  Social History Narrative   Walks with a rolling walker; remote hx of smoking; no alcohol; [place]- with husband/son. Daughter lives in [place].    Social Determinants of Health   Financial Resource Strain: Low Risk  (06/23/2020)   Overall Financial Resource Strain (CARDIA)    Difficulty of Paying Living Expenses: Not very hard  Food Insecurity: No Food Insecurity (01/06/2021)   Hunger Vital Sign    Worried About Running Out of Food in the Last Year: Never true    Ran Out of Food in the Last Year: Never true  Transportation Needs: No Transportation Needs (01/06/2021)   PRAPARE - Hydrologist (Medical): No    Lack of Transportation (Non-Medical): No  Physical Activity: Not on file  Stress: Not on file  Social Connections: Not on file  Intimate Partner Violence: Not on file      Review of Systems  Constitutional:  Negative for chills, fatigue and unexpected weight change.  HENT:  Negative for congestion, postnasal drip, rhinorrhea, sneezing  and sore throat.   Eyes:  Negative for redness.  Respiratory:  Negative for cough, chest tightness and shortness of breath.   Cardiovascular:  Negative for chest pain and palpitations.  Gastrointestinal:  Negative for abdominal pain, constipation, diarrhea, nausea and vomiting.  Genitourinary:  Negative for dysuria and frequency.  Musculoskeletal:  Positive for arthralgias and gait problem. Negative for back pain, joint swelling and neck pain.  Skin:  Positive for wound. Negative for rash.       Pressure wound of left heel  Neurological:  Negative for tremors and numbness.  Hematological:  Negative for adenopathy. Does not bruise/bleed easily.  Psychiatric/Behavioral:  Negative for behavioral problems (Depression), sleep disturbance and suicidal ideas. The patient is not nervous/anxious.     Vital Signs: BP (!) 144/60 Comment: 151/98  Pulse 69   Temp 98.3 F (36.8 C)   Resp 16   Ht '5\' 7"'  (1.702 m)   Wt 174 lb (78.9 kg)   SpO2 99%   BMI 27.25 kg/m    Physical Exam Constitutional:      General: She is not in acute distress.    Appearance: She is well-developed. She is not diaphoretic.  HENT:     Head: Normocephalic and atraumatic.     Mouth/Throat:     Pharynx: No oropharyngeal exudate.  Eyes:     Pupils: Pupils are equal, round, and reactive to light.  Neck:     Thyroid: No thyromegaly.     Vascular: No JVD.     Trachea: No tracheal deviation.  Cardiovascular:     Rate and Rhythm: Normal rate and regular rhythm.     Heart sounds: Normal heart sounds. No murmur heard.    No friction rub. No gallop.  Pulmonary:     Effort: Pulmonary effort is normal. No respiratory distress.     Breath sounds: No wheezing or rales.  Chest:     Chest wall: No tenderness.  Abdominal:  General: Bowel sounds are normal.     Palpations: Abdomen is soft.  Musculoskeletal:        General: Normal range of motion.     Cervical back: Normal range of motion and neck supple.   Lymphadenopathy:     Cervical: No cervical adenopathy.  Skin:    General: Skin is warm and dry.     Comments: Well healing pressure ulcer  Neurological:     Mental Status: She is alert and oriented to person, place, and time.     Cranial Nerves: No cranial nerve deficit.     Gait: Gait abnormal.     Comments: Using walker  Psychiatric:        Behavior: Behavior normal.        Thought Content: Thought content normal.        Judgment: Judgment normal.        Assessment/Plan: 1. Type 2 diabetes mellitus with chronic kidney disease on chronic dialysis, with long-term current use of insulin (HCC) - POCT HgB A1C is 5.0 which is normal range. Discussed potentially stopping insulin but pt resistant to this as her numbers rise too much off of it in the past. Will start by decreasing to 4units BID and then stop after that if numbers still stable. Discussed the importance of avoiding lows and she will call office if any low readings.  2. Primary hypertension BP borderline in office, but stable at home, will continue current medications and monitoring  3. Skin ulcer of left heel with fat layer exposed ([place]) Improving, continue wound care with home health nurse  4. ESRD (end stage renal disease) ([place]) Followed by nephrology   General Counseling: Tyrone Nine understanding of the findings of todays visit and agrees with plan of treatment. I have discussed any further diagnostic evaluation that may be needed or ordered today. We also reviewed her medications today. she has been encouraged to call the office with any questions or concerns that should arise related to todays visit.    Orders Placed This Encounter  Procedures   POCT HgB A1C    Meds ordered this encounter  Medications   donepezil (ARICEPT) 10 MG tablet    Sig: TAKE 1 TABLET AT BEDTIME FOR MEMORY (NEED MD APPOINTMENT)    Dispense:  90 tablet    Refill:  1    This patient was seen by Drema Dallas, PA-C in  collaboration with Dr. Clayborn Bigness as a part of collaborative care agreement.   Total time spent:30 Minutes Time spent includes review of chart, medications, test results, and follow up plan with the patient.      Dr [name] Internal medicine

## 2021-08-29 DIAGNOSIS — N186 End stage renal disease: Secondary | ICD-10-CM | POA: Diagnosis not present

## 2021-08-29 DIAGNOSIS — N2581 Secondary hyperparathyroidism of renal origin: Secondary | ICD-10-CM | POA: Diagnosis not present

## 2021-08-29 DIAGNOSIS — Z992 Dependence on renal dialysis: Secondary | ICD-10-CM | POA: Diagnosis not present

## 2021-08-31 DIAGNOSIS — Z992 Dependence on renal dialysis: Secondary | ICD-10-CM | POA: Diagnosis not present

## 2021-08-31 DIAGNOSIS — N2581 Secondary hyperparathyroidism of renal origin: Secondary | ICD-10-CM | POA: Diagnosis not present

## 2021-08-31 DIAGNOSIS — N186 End stage renal disease: Secondary | ICD-10-CM | POA: Diagnosis not present

## 2021-09-02 DIAGNOSIS — Z992 Dependence on renal dialysis: Secondary | ICD-10-CM | POA: Diagnosis not present

## 2021-09-02 DIAGNOSIS — N186 End stage renal disease: Secondary | ICD-10-CM | POA: Diagnosis not present

## 2021-09-02 DIAGNOSIS — N2581 Secondary hyperparathyroidism of renal origin: Secondary | ICD-10-CM | POA: Diagnosis not present

## 2021-09-05 DIAGNOSIS — N186 End stage renal disease: Secondary | ICD-10-CM | POA: Diagnosis not present

## 2021-09-05 DIAGNOSIS — Z992 Dependence on renal dialysis: Secondary | ICD-10-CM | POA: Diagnosis not present

## 2021-09-05 DIAGNOSIS — N2581 Secondary hyperparathyroidism of renal origin: Secondary | ICD-10-CM | POA: Diagnosis not present

## 2021-09-06 ENCOUNTER — Other Ambulatory Visit: Payer: Self-pay

## 2021-09-06 NOTE — Patient Outreach (Signed)
[facility]) Care Management  09/06/2021  Jennifer Carrillo August 18, 1933 [id number]   Case Closure    Case is being transferred. Assigned RN CM will outreach and follow up with patient.     Enzo Montgomery, RN,BSN,CCM [place] Management Telephonic Care Management Coordinator Direct Phone: [phone] Toll Free: [phone] Fax: [phone]

## 2021-09-07 DIAGNOSIS — N186 End stage renal disease: Secondary | ICD-10-CM | POA: Diagnosis not present

## 2021-09-07 DIAGNOSIS — N2581 Secondary hyperparathyroidism of renal origin: Secondary | ICD-10-CM | POA: Diagnosis not present

## 2021-09-07 DIAGNOSIS — Z992 Dependence on renal dialysis: Secondary | ICD-10-CM | POA: Diagnosis not present

## 2021-09-09 DIAGNOSIS — N2581 Secondary hyperparathyroidism of renal origin: Secondary | ICD-10-CM | POA: Diagnosis not present

## 2021-09-09 DIAGNOSIS — N186 End stage renal disease: Secondary | ICD-10-CM | POA: Diagnosis not present

## 2021-09-09 DIAGNOSIS — Z992 Dependence on renal dialysis: Secondary | ICD-10-CM | POA: Diagnosis not present

## 2021-09-12 DIAGNOSIS — N2581 Secondary hyperparathyroidism of renal origin: Secondary | ICD-10-CM | POA: Diagnosis not present

## 2021-09-12 DIAGNOSIS — N186 End stage renal disease: Secondary | ICD-10-CM | POA: Diagnosis not present

## 2021-09-12 DIAGNOSIS — Z992 Dependence on renal dialysis: Secondary | ICD-10-CM | POA: Diagnosis not present

## 2021-09-14 DIAGNOSIS — N2581 Secondary hyperparathyroidism of renal origin: Secondary | ICD-10-CM | POA: Diagnosis not present

## 2021-09-14 DIAGNOSIS — N186 End stage renal disease: Secondary | ICD-10-CM | POA: Diagnosis not present

## 2021-09-14 DIAGNOSIS — Z992 Dependence on renal dialysis: Secondary | ICD-10-CM | POA: Diagnosis not present

## 2021-09-16 DIAGNOSIS — N186 End stage renal disease: Secondary | ICD-10-CM | POA: Diagnosis not present

## 2021-09-16 DIAGNOSIS — Z992 Dependence on renal dialysis: Secondary | ICD-10-CM | POA: Diagnosis not present

## 2021-09-16 DIAGNOSIS — N2581 Secondary hyperparathyroidism of renal origin: Secondary | ICD-10-CM | POA: Diagnosis not present

## 2021-09-18 ENCOUNTER — Ambulatory Visit
Admission: RE | Admit: 2021-09-18 | Discharge: 2021-09-18 | Disposition: A | Discharge status: Home / Self Care | Payer: Medicare HMO | Source: Ambulatory Visit | Attending: Physician Assistant | Admitting: Physician Assistant

## 2021-09-18 DIAGNOSIS — Z853 Personal history of malignant neoplasm of breast: Secondary | ICD-10-CM | POA: Insufficient documentation

## 2021-09-18 DIAGNOSIS — Z1231 Encounter for screening mammogram for malignant neoplasm of breast: Secondary | ICD-10-CM | POA: Diagnosis not present

## 2021-09-19 DIAGNOSIS — Z992 Dependence on renal dialysis: Secondary | ICD-10-CM | POA: Diagnosis not present

## 2021-09-19 DIAGNOSIS — N2581 Secondary hyperparathyroidism of renal origin: Secondary | ICD-10-CM | POA: Diagnosis not present

## 2021-09-19 DIAGNOSIS — N186 End stage renal disease: Secondary | ICD-10-CM | POA: Diagnosis not present

## 2021-09-21 DIAGNOSIS — N186 End stage renal disease: Secondary | ICD-10-CM | POA: Diagnosis not present

## 2021-09-21 DIAGNOSIS — Z992 Dependence on renal dialysis: Secondary | ICD-10-CM | POA: Diagnosis not present

## 2021-09-21 DIAGNOSIS — N2581 Secondary hyperparathyroidism of renal origin: Secondary | ICD-10-CM | POA: Diagnosis not present

## 2021-09-22 DIAGNOSIS — M1712 Unilateral primary osteoarthritis, left knee: Secondary | ICD-10-CM | POA: Diagnosis not present

## 2021-09-22 DIAGNOSIS — G8929 Other chronic pain: Secondary | ICD-10-CM | POA: Diagnosis not present

## 2021-09-22 DIAGNOSIS — M25562 Pain in left knee: Secondary | ICD-10-CM | POA: Diagnosis not present

## 2021-09-22 DIAGNOSIS — M170 Bilateral primary osteoarthritis of knee: Secondary | ICD-10-CM | POA: Diagnosis not present

## 2021-09-22 DIAGNOSIS — M25561 Pain in right knee: Secondary | ICD-10-CM | POA: Diagnosis not present

## 2021-09-22 DIAGNOSIS — M1711 Unilateral primary osteoarthritis, right knee: Secondary | ICD-10-CM | POA: Diagnosis not present

## 2021-09-23 DIAGNOSIS — N186 End stage renal disease: Secondary | ICD-10-CM | POA: Diagnosis not present

## 2021-09-23 DIAGNOSIS — Z992 Dependence on renal dialysis: Secondary | ICD-10-CM | POA: Diagnosis not present

## 2021-09-23 DIAGNOSIS — N2581 Secondary hyperparathyroidism of renal origin: Secondary | ICD-10-CM | POA: Diagnosis not present

## 2021-09-26 DIAGNOSIS — Z992 Dependence on renal dialysis: Secondary | ICD-10-CM | POA: Diagnosis not present

## 2021-09-26 DIAGNOSIS — N186 End stage renal disease: Secondary | ICD-10-CM | POA: Diagnosis not present

## 2021-09-26 DIAGNOSIS — N2581 Secondary hyperparathyroidism of renal origin: Secondary | ICD-10-CM | POA: Diagnosis not present

## 2021-09-28 DIAGNOSIS — Z992 Dependence on renal dialysis: Secondary | ICD-10-CM | POA: Diagnosis not present

## 2021-09-28 DIAGNOSIS — N186 End stage renal disease: Secondary | ICD-10-CM | POA: Diagnosis not present

## 2021-09-28 DIAGNOSIS — N2581 Secondary hyperparathyroidism of renal origin: Secondary | ICD-10-CM | POA: Diagnosis not present

## 2021-09-30 DIAGNOSIS — N186 End stage renal disease: Secondary | ICD-10-CM | POA: Diagnosis not present

## 2021-09-30 DIAGNOSIS — N2581 Secondary hyperparathyroidism of renal origin: Secondary | ICD-10-CM | POA: Diagnosis not present

## 2021-09-30 DIAGNOSIS — Z992 Dependence on renal dialysis: Secondary | ICD-10-CM | POA: Diagnosis not present

## 2021-10-03 DIAGNOSIS — Z79899 Other long term (current) drug therapy: Secondary | ICD-10-CM | POA: Diagnosis not present

## 2021-10-03 DIAGNOSIS — N2581 Secondary hyperparathyroidism of renal origin: Secondary | ICD-10-CM | POA: Diagnosis not present

## 2021-10-03 DIAGNOSIS — N186 End stage renal disease: Secondary | ICD-10-CM | POA: Diagnosis not present

## 2021-10-03 DIAGNOSIS — Z992 Dependence on renal dialysis: Secondary | ICD-10-CM | POA: Diagnosis not present

## 2021-10-05 DIAGNOSIS — N2581 Secondary hyperparathyroidism of renal origin: Secondary | ICD-10-CM | POA: Diagnosis not present

## 2021-10-05 DIAGNOSIS — N186 End stage renal disease: Secondary | ICD-10-CM | POA: Diagnosis not present

## 2021-10-05 DIAGNOSIS — Z992 Dependence on renal dialysis: Secondary | ICD-10-CM | POA: Diagnosis not present

## 2021-10-06 ENCOUNTER — Telehealth: Payer: Self-pay

## 2021-10-06 ENCOUNTER — Other Ambulatory Visit: Payer: Self-pay

## 2021-10-07 DIAGNOSIS — N2581 Secondary hyperparathyroidism of renal origin: Secondary | ICD-10-CM | POA: Diagnosis not present

## 2021-10-07 DIAGNOSIS — N186 End stage renal disease: Secondary | ICD-10-CM | POA: Diagnosis not present

## 2021-10-07 DIAGNOSIS — Z992 Dependence on renal dialysis: Secondary | ICD-10-CM | POA: Diagnosis not present

## 2021-10-09 ENCOUNTER — Other Ambulatory Visit: Payer: Self-pay

## 2021-10-10 DIAGNOSIS — N186 End stage renal disease: Secondary | ICD-10-CM | POA: Diagnosis not present

## 2021-10-10 DIAGNOSIS — N2581 Secondary hyperparathyroidism of renal origin: Secondary | ICD-10-CM | POA: Diagnosis not present

## 2021-10-10 DIAGNOSIS — Z992 Dependence on renal dialysis: Secondary | ICD-10-CM | POA: Diagnosis not present

## 2021-10-11 ENCOUNTER — Other Ambulatory Visit: Payer: Self-pay

## 2021-10-11 NOTE — Telephone Encounter (Signed)
error 

## 2021-10-11 NOTE — Telephone Encounter (Signed)
Pt husband called back that pt is no longer on atorvastatin already D/c from med list

## 2021-10-12 DIAGNOSIS — Z992 Dependence on renal dialysis: Secondary | ICD-10-CM | POA: Diagnosis not present

## 2021-10-12 DIAGNOSIS — N2581 Secondary hyperparathyroidism of renal origin: Secondary | ICD-10-CM | POA: Diagnosis not present

## 2021-10-12 DIAGNOSIS — N186 End stage renal disease: Secondary | ICD-10-CM | POA: Diagnosis not present

## 2021-10-14 DIAGNOSIS — N186 End stage renal disease: Secondary | ICD-10-CM | POA: Diagnosis not present

## 2021-10-14 DIAGNOSIS — N2581 Secondary hyperparathyroidism of renal origin: Secondary | ICD-10-CM | POA: Diagnosis not present

## 2021-10-14 DIAGNOSIS — Z992 Dependence on renal dialysis: Secondary | ICD-10-CM | POA: Diagnosis not present

## 2021-10-17 DIAGNOSIS — Z992 Dependence on renal dialysis: Secondary | ICD-10-CM | POA: Diagnosis not present

## 2021-10-17 DIAGNOSIS — N2581 Secondary hyperparathyroidism of renal origin: Secondary | ICD-10-CM | POA: Diagnosis not present

## 2021-10-17 DIAGNOSIS — N186 End stage renal disease: Secondary | ICD-10-CM | POA: Diagnosis not present

## 2021-10-19 DIAGNOSIS — N2581 Secondary hyperparathyroidism of renal origin: Secondary | ICD-10-CM | POA: Diagnosis not present

## 2021-10-19 DIAGNOSIS — N186 End stage renal disease: Secondary | ICD-10-CM | POA: Diagnosis not present

## 2021-10-19 DIAGNOSIS — Z992 Dependence on renal dialysis: Secondary | ICD-10-CM | POA: Diagnosis not present

## 2021-10-21 DIAGNOSIS — Z992 Dependence on renal dialysis: Secondary | ICD-10-CM | POA: Diagnosis not present

## 2021-10-21 DIAGNOSIS — N186 End stage renal disease: Secondary | ICD-10-CM | POA: Diagnosis not present

## 2021-10-21 DIAGNOSIS — N2581 Secondary hyperparathyroidism of renal origin: Secondary | ICD-10-CM | POA: Diagnosis not present

## 2021-10-24 DIAGNOSIS — Z992 Dependence on renal dialysis: Secondary | ICD-10-CM | POA: Diagnosis not present

## 2021-10-24 DIAGNOSIS — N2581 Secondary hyperparathyroidism of renal origin: Secondary | ICD-10-CM | POA: Diagnosis not present

## 2021-10-24 DIAGNOSIS — N186 End stage renal disease: Secondary | ICD-10-CM | POA: Diagnosis not present

## 2021-10-26 DIAGNOSIS — Z992 Dependence on renal dialysis: Secondary | ICD-10-CM | POA: Diagnosis not present

## 2021-10-26 DIAGNOSIS — N186 End stage renal disease: Secondary | ICD-10-CM | POA: Diagnosis not present

## 2021-10-26 DIAGNOSIS — N2581 Secondary hyperparathyroidism of renal origin: Secondary | ICD-10-CM | POA: Diagnosis not present

## 2021-10-28 ENCOUNTER — Emergency Department: Payer: Medicare HMO

## 2021-10-28 ENCOUNTER — Observation Stay: Payer: Medicare HMO

## 2021-10-28 ENCOUNTER — Observation Stay
Admission: EM | Admit: 2021-10-28 | Discharge: 2021-10-29 | Disposition: A | Discharge status: Home / Self Care | Payer: Medicare HMO | Attending: Emergency Medicine | Admitting: Emergency Medicine

## 2021-10-28 DIAGNOSIS — Z79899 Other long term (current) drug therapy: Secondary | ICD-10-CM | POA: Diagnosis not present

## 2021-10-28 DIAGNOSIS — Z952 Presence of prosthetic heart valve: Secondary | ICD-10-CM | POA: Insufficient documentation

## 2021-10-28 DIAGNOSIS — I2510 Atherosclerotic heart disease of native coronary artery without angina pectoris: Secondary | ICD-10-CM | POA: Diagnosis not present

## 2021-10-28 DIAGNOSIS — I739 Peripheral vascular disease, unspecified: Secondary | ICD-10-CM | POA: Diagnosis present

## 2021-10-28 DIAGNOSIS — E119 Type 2 diabetes mellitus without complications: Secondary | ICD-10-CM | POA: Diagnosis not present

## 2021-10-28 DIAGNOSIS — Z992 Dependence on renal dialysis: Secondary | ICD-10-CM | POA: Diagnosis not present

## 2021-10-28 DIAGNOSIS — R0602 Shortness of breath: Secondary | ICD-10-CM | POA: Diagnosis not present

## 2021-10-28 DIAGNOSIS — Z170 Estrogen receptor positive status [ER+]: Secondary | ICD-10-CM | POA: Diagnosis not present

## 2021-10-28 DIAGNOSIS — M47814 Spondylosis without myelopathy or radiculopathy, thoracic region: Secondary | ICD-10-CM | POA: Diagnosis not present

## 2021-10-28 DIAGNOSIS — R0902 Hypoxemia: Secondary | ICD-10-CM | POA: Diagnosis not present

## 2021-10-28 DIAGNOSIS — C50112 Malignant neoplasm of central portion of left female breast: Secondary | ICD-10-CM

## 2021-10-28 DIAGNOSIS — Z1152 Encounter for screening for COVID-19: Secondary | ICD-10-CM | POA: Insufficient documentation

## 2021-10-28 DIAGNOSIS — K529 Noninfective gastroenteritis and colitis, unspecified: Secondary | ICD-10-CM | POA: Diagnosis not present

## 2021-10-28 DIAGNOSIS — E1122 Type 2 diabetes mellitus with diabetic chronic kidney disease: Secondary | ICD-10-CM | POA: Diagnosis not present

## 2021-10-28 DIAGNOSIS — I10 Essential (primary) hypertension: Secondary | ICD-10-CM | POA: Diagnosis not present

## 2021-10-28 DIAGNOSIS — Z955 Presence of coronary angioplasty implant and graft: Secondary | ICD-10-CM | POA: Insufficient documentation

## 2021-10-28 DIAGNOSIS — M170 Bilateral primary osteoarthritis of knee: Secondary | ICD-10-CM | POA: Diagnosis present

## 2021-10-28 DIAGNOSIS — R6520 Severe sepsis without septic shock: Secondary | ICD-10-CM

## 2021-10-28 DIAGNOSIS — N170 Acute kidney failure with tubular necrosis: Secondary | ICD-10-CM

## 2021-10-28 DIAGNOSIS — K5730 Diverticulosis of large intestine without perforation or abscess without bleeding: Secondary | ICD-10-CM | POA: Diagnosis not present

## 2021-10-28 DIAGNOSIS — E872 Acidosis, unspecified: Secondary | ICD-10-CM

## 2021-10-28 DIAGNOSIS — D631 Anemia in chronic kidney disease: Secondary | ICD-10-CM | POA: Diagnosis not present

## 2021-10-28 DIAGNOSIS — I700 Atherosclerosis of aorta: Secondary | ICD-10-CM | POA: Diagnosis not present

## 2021-10-28 DIAGNOSIS — J449 Chronic obstructive pulmonary disease, unspecified: Secondary | ICD-10-CM | POA: Insufficient documentation

## 2021-10-28 DIAGNOSIS — J45909 Unspecified asthma, uncomplicated: Secondary | ICD-10-CM | POA: Diagnosis not present

## 2021-10-28 DIAGNOSIS — C50919 Malignant neoplasm of unspecified site of unspecified female breast: Secondary | ICD-10-CM | POA: Diagnosis not present

## 2021-10-28 DIAGNOSIS — N186 End stage renal disease: Secondary | ICD-10-CM | POA: Diagnosis present

## 2021-10-28 DIAGNOSIS — Z794 Long term (current) use of insulin: Secondary | ICD-10-CM | POA: Diagnosis not present

## 2021-10-28 DIAGNOSIS — Z8673 Personal history of transient ischemic attack (TIA), and cerebral infarction without residual deficits: Secondary | ICD-10-CM | POA: Insufficient documentation

## 2021-10-28 DIAGNOSIS — R0689 Other abnormalities of breathing: Secondary | ICD-10-CM | POA: Diagnosis not present

## 2021-10-28 DIAGNOSIS — A419 Sepsis, unspecified organism: Principal | ICD-10-CM | POA: Diagnosis present

## 2021-10-28 DIAGNOSIS — I120 Hypertensive chronic kidney disease with stage 5 chronic kidney disease or end stage renal disease: Secondary | ICD-10-CM | POA: Diagnosis not present

## 2021-10-28 DIAGNOSIS — I150 Renovascular hypertension: Secondary | ICD-10-CM | POA: Diagnosis present

## 2021-10-28 DIAGNOSIS — N2889 Other specified disorders of kidney and ureter: Secondary | ICD-10-CM | POA: Diagnosis not present

## 2021-10-28 DIAGNOSIS — M546 Pain in thoracic spine: Secondary | ICD-10-CM | POA: Diagnosis not present

## 2021-10-28 DIAGNOSIS — M40204 Unspecified kyphosis, thoracic region: Secondary | ICD-10-CM | POA: Diagnosis not present

## 2021-10-28 DIAGNOSIS — Z7902 Long term (current) use of antithrombotics/antiplatelets: Secondary | ICD-10-CM | POA: Diagnosis not present

## 2021-10-28 DIAGNOSIS — N2581 Secondary hyperparathyroidism of renal origin: Secondary | ICD-10-CM | POA: Diagnosis not present

## 2021-10-28 DIAGNOSIS — I959 Hypotension, unspecified: Secondary | ICD-10-CM | POA: Diagnosis not present

## 2021-10-28 DIAGNOSIS — Z87891 Personal history of nicotine dependence: Secondary | ICD-10-CM | POA: Insufficient documentation

## 2021-10-28 DIAGNOSIS — R079 Chest pain, unspecified: Secondary | ICD-10-CM | POA: Diagnosis not present

## 2021-10-28 DIAGNOSIS — N281 Cyst of kidney, acquired: Secondary | ICD-10-CM | POA: Diagnosis not present

## 2021-10-28 DIAGNOSIS — I6381 Other cerebral infarction due to occlusion or stenosis of small artery: Secondary | ICD-10-CM | POA: Diagnosis not present

## 2021-10-28 DIAGNOSIS — R519 Headache, unspecified: Secondary | ICD-10-CM | POA: Insufficient documentation

## 2021-10-28 LAB — RESPIRATORY PANEL BY PCR

## 2021-10-28 LAB — CBC WITH DIFFERENTIAL/PLATELET
Abs Immature Granulocytes: 0.13 10*3/uL — ABNORMAL HIGH (ref 0.00–0.07)
Basophils Absolute: 0.1 10*3/uL (ref 0.0–0.1)
Basophils Relative: 1 %
Eosinophils Absolute: 0.1 10*3/uL (ref 0.0–0.5)
Eosinophils Relative: 1 %
HCT: 34.9 % — ABNORMAL LOW (ref 36.0–46.0)
Hemoglobin: 10.8 g/dL — ABNORMAL LOW (ref 12.0–15.0)
Immature Granulocytes: 1 %
Lymphocytes Relative: 17 %
Lymphs Abs: 1.7 10*3/uL (ref 0.7–4.0)
MCH: 28.3 pg (ref 26.0–34.0)
MCHC: 30.9 g/dL (ref 30.0–36.0)
MCV: 91.6 fL (ref 80.0–100.0)
Monocytes Absolute: 0.8 10*3/uL (ref 0.1–1.0)
Monocytes Relative: 8 %
Neutro Abs: 7.2 10*3/uL (ref 1.7–7.7)
Neutrophils Relative %: 72 %
Platelets: 283 10*3/uL (ref 150–400)
RBC: 3.81 MIL/uL — ABNORMAL LOW (ref 3.87–5.11)
RDW: 15.3 % (ref 11.5–15.5)
WBC: 9.9 10*3/uL (ref 4.0–10.5)
nRBC: 0 % (ref 0.0–0.2)

## 2021-10-28 LAB — CBG MONITORING, ED
Glucose-Capillary: 121 mg/dL — ABNORMAL HIGH (ref 70–99)
Glucose-Capillary: 150 mg/dL — ABNORMAL HIGH (ref 70–99)

## 2021-10-28 LAB — COMPREHENSIVE METABOLIC PANEL
ALT: 21 U/L (ref 0–44)
AST: 32 U/L (ref 15–41)
Albumin: 4 g/dL (ref 3.5–5.0)
Alkaline Phosphatase: 100 U/L (ref 38–126)
Anion gap: 12 (ref 5–15)
BUN: 17 mg/dL (ref 8–23)
CO2: 24 mmol/L (ref 22–32)
Calcium: 9.4 mg/dL (ref 8.9–10.3)
Chloride: 102 mmol/L (ref 98–111)
Creatinine, Ser: 2.63 mg/dL — ABNORMAL HIGH (ref 0.44–1.00)
GFR, Estimated: 17 mL/min — ABNORMAL LOW (ref >60)
Glucose, Bld: 144 mg/dL — ABNORMAL HIGH (ref 70–99)
Potassium: 3.4 mmol/L — ABNORMAL LOW (ref 3.5–5.1)
Sodium: 138 mmol/L (ref 135–145)
Total Bilirubin: 0.3 mg/dL (ref 0.3–1.2)
Total Protein: 7.5 g/dL (ref 6.5–8.1)

## 2021-10-28 LAB — URINALYSIS, ROUTINE W REFLEX MICROSCOPIC
Bilirubin Urine: NEGATIVE
Glucose, UA: NEGATIVE mg/dL
Hgb urine dipstick: NEGATIVE
Ketones, ur: NEGATIVE mg/dL
Leukocytes,Ua: NEGATIVE
Nitrite: NEGATIVE
Protein, ur: 100 mg/dL — AB
Specific Gravity, Urine: 1.004 — ABNORMAL LOW (ref 1.005–1.030)
pH: 9 — ABNORMAL HIGH (ref 5.0–8.0)

## 2021-10-28 LAB — SARS CORONAVIRUS 2 BY RT PCR: SARS Coronavirus 2 by RT PCR: NEGATIVE

## 2021-10-28 LAB — PROCALCITONIN: Procalcitonin: <0.1 ng/mL

## 2021-10-28 LAB — LIPASE, BLOOD: Lipase: 58 U/L — ABNORMAL HIGH (ref 11–51)

## 2021-10-28 LAB — TROPONIN I (HIGH SENSITIVITY)
Troponin I (High Sensitivity): 12 ng/L (ref <18)
Troponin I (High Sensitivity): 13 ng/L (ref <18)

## 2021-10-28 LAB — LACTIC ACID, PLASMA
Lactic Acid, Venous: 3.8 mmol/L (ref 0.5–1.9)
Lactic Acid, Venous: 1.7 mmol/L (ref 0.5–1.9)

## 2021-10-28 MED ORDER — SODIUM CHLORIDE 0.9 % IV BOLUS
500.0000 mL | Freq: Once | INTRAVENOUS | Status: AC
  Administered 2021-10-28: 500 mL via INTRAVENOUS

## 2021-10-28 MED ORDER — ISOSORBIDE MONONITRATE ER 60 MG PO TB24
60.0000 mg | ORAL_TABLET | Freq: Every day | ORAL | Status: DC
  Administered 2021-10-29: 60 mg via ORAL
  Filled 2021-10-28: qty 1

## 2021-10-28 MED ORDER — ACETAMINOPHEN 325 MG PO TABS: 650.0000 mg | ORAL_TABLET | Freq: Four times a day (QID) | ORAL | Status: DC | PRN

## 2021-10-28 MED ORDER — ONDANSETRON HCL 4 MG PO TABS: 4.0000 mg | ORAL_TABLET | Freq: Four times a day (QID) | ORAL | Status: DC | PRN

## 2021-10-28 MED ORDER — ACETAMINOPHEN 650 MG RE SUPP: 650.0000 mg | Freq: Four times a day (QID) | RECTAL | Status: DC | PRN

## 2021-10-28 MED ORDER — SENNOSIDES-DOCUSATE SODIUM 8.6-50 MG PO TABS: 1.0000 | ORAL_TABLET | Freq: Every evening | ORAL | Status: DC | PRN

## 2021-10-28 MED ORDER — DONEPEZIL HCL 5 MG PO TABS
10.0000 mg | ORAL_TABLET | Freq: Every day | ORAL | Status: DC
  Administered 2021-10-28: 10 mg via ORAL
  Filled 2021-10-28: qty 2

## 2021-10-28 MED ORDER — INSULIN ASPART 100 UNIT/ML IJ SOLN: 0.0000 [IU] | Freq: Every day | INTRAMUSCULAR | Status: DC

## 2021-10-28 MED ORDER — CLOPIDOGREL BISULFATE 75 MG PO TABS
75.0000 mg | ORAL_TABLET | Freq: Every day | ORAL | Status: DC
  Administered 2021-10-28: 75 mg via ORAL
  Filled 2021-10-28: qty 1

## 2021-10-28 MED ORDER — DOCUSATE SODIUM 100 MG PO CAPS: 200.0000 mg | ORAL_CAPSULE | Freq: Every evening | ORAL | Status: DC | PRN

## 2021-10-28 MED ORDER — RANOLAZINE ER 500 MG PO TB12
500.0000 mg | ORAL_TABLET | Freq: Two times a day (BID) | ORAL | Status: DC
  Administered 2021-10-28 – 2021-10-29 (×2): 500 mg via ORAL
  Filled 2021-10-28 (×2): qty 1

## 2021-10-28 MED ORDER — ANASTROZOLE 1 MG PO TABS
1.0000 mg | ORAL_TABLET | Freq: Every day | ORAL | Status: DC
  Filled 2021-10-28: qty 1

## 2021-10-28 MED ORDER — METRONIDAZOLE 500 MG/100ML IV SOLN
500.0000 mg | Freq: Two times a day (BID) | INTRAVENOUS | Status: DC
  Administered 2021-10-28 – 2021-10-29 (×2): 500 mg via INTRAVENOUS
  Filled 2021-10-28: qty 100

## 2021-10-28 MED ORDER — SODIUM CHLORIDE 0.9 % IV SOLN: 2.0000 g | INTRAVENOUS | Status: DC

## 2021-10-28 MED ORDER — SODIUM CHLORIDE 0.9 % IV BOLUS (SEPSIS): 250.0000 mL | Freq: Once | INTRAVENOUS | Status: DC

## 2021-10-28 MED ORDER — AMLODIPINE BESYLATE 5 MG PO TABS
10.0000 mg | ORAL_TABLET | Freq: Every day | ORAL | Status: DC
  Administered 2021-10-29: 10 mg via ORAL
  Filled 2021-10-28: qty 2

## 2021-10-28 MED ORDER — INSULIN ASPART 100 UNIT/ML IJ SOLN: 0.0000 [IU] | Freq: Three times a day (TID) | INTRAMUSCULAR | Status: DC

## 2021-10-28 MED ORDER — SODIUM CHLORIDE 0.9 % IV SOLN
2.0000 g | Freq: Once | INTRAVENOUS | Status: AC
  Administered 2021-10-28: 2 g via INTRAVENOUS
  Filled 2021-10-28: qty 12.5

## 2021-10-28 MED ORDER — VANCOMYCIN VARIABLE DOSE PER UNSTABLE RENAL FUNCTION (PHARMACIST DOSING): Status: DC

## 2021-10-28 MED ORDER — METRONIDAZOLE 500 MG/100ML IV SOLN
500.0000 mg | Freq: Once | INTRAVENOUS | Status: DC
  Filled 2021-10-28: qty 100

## 2021-10-28 MED ORDER — VANCOMYCIN HCL 1750 MG/350ML IV SOLN
1750.0000 mg | Freq: Once | INTRAVENOUS | Status: AC
  Administered 2021-10-28: 1750 mg via INTRAVENOUS
  Filled 2021-10-28 (×2): qty 350

## 2021-10-28 MED ORDER — HEPARIN SODIUM (PORCINE) 5000 UNIT/ML IJ SOLN
5000.0000 [IU] | Freq: Three times a day (TID) | INTRAMUSCULAR | Status: DC
  Administered 2021-10-28 – 2021-10-29 (×2): 5000 [IU] via SUBCUTANEOUS
  Filled 2021-10-28 (×2): qty 1

## 2021-10-28 MED ORDER — VANCOMYCIN HCL IN DEXTROSE 1-5 GM/200ML-% IV SOLN: 1000.0000 mg | Freq: Once | INTRAVENOUS | Status: DC

## 2021-10-28 MED ORDER — ONDANSETRON HCL 4 MG/2ML IJ SOLN: 4.0000 mg | Freq: Four times a day (QID) | INTRAMUSCULAR | Status: DC | PRN

## 2021-10-28 MED ORDER — GADOBUTROL 1 MMOL/ML IV SOLN
7.5000 mL | Freq: Once | INTRAVENOUS | Status: AC | PRN
  Administered 2021-10-28: 7.5 mL via INTRAVENOUS

## 2021-10-28 MED ORDER — HYDRALAZINE HCL 20 MG/ML IJ SOLN: 5.0000 mg | Freq: Three times a day (TID) | INTRAMUSCULAR | Status: DC | PRN

## 2021-10-28 NOTE — Assessment & Plan Note (Signed)
-   Resumed anastrozole 1 mg daily after breakfast

## 2021-10-28 NOTE — Progress Notes (Signed)
[facility] following for sepsis protocol. 

## 2021-10-28 NOTE — Assessment & Plan Note (Addendum)
-   Patient takes amlodipine 10 mg daily, presumably for 10/29/2021 - Hydralazine 5 mg IV every 8 hours as needed for SBP greater than 180, 3 days ordered

## 2021-10-28 NOTE — Consult Note (Signed)
CODE SEPSIS - PHARMACY COMMUNICATION  **Broad Spectrum Antibiotics should be administered within 1 hour of Sepsis diagnosis**  Time Code Sepsis Called/Page Received: 1453  Antibiotics Ordered: cefepime, vancomycin,flagyl   Time of 1st antibiotic administration: 1603  Additional action taken by pharmacy: Meds not given within the hour. Contacted RN to clarify if there were any needs from pharmacy. RN states blood cultures are being drawn and that they'd hang it in about 5 minutes.   If necessary, Name of Provider/Nurse Contacted: Dollene Primrose, RN    Darrick Penna ,PharmD Clinical Pharmacist  10/28/2021  2:55 PM

## 2021-10-28 NOTE — Assessment & Plan Note (Addendum)
-   Ranolazine 500 mg p.o. twice daily, Imdur 60 mg daily - Plavix 75 mg daily resumed

## 2021-10-28 NOTE — Assessment & Plan Note (Signed)
-   Patient completed dialysis on day of admission prior to presenting to the ED

## 2021-10-28 NOTE — Hospital Course (Signed)
Ms. Jennifer Carrillo is an 88 year old female with history of end-stage renal disease on hemodialysis, hypertension, memory decline, insulin-dependent diabetes mellitus, who presents emergency department for chief concerns of sudden onset generalized muscle aches for 1 day, also endorsed some nausea and vomiting.  Had dialysis before presenting to the ED.  Initial vitals in the emergency department showed temperature of 98.4, respiration rate of 22, heart rate of 59, blood pressure 170/45, SPO2 100% on room air.  Serum sodium is 138, potassium 3.4, chloride 102, bicarb 24, BUN of 17, serum creatinine of 2.62, GFR 17, nonfasting blood glucose 144, WBC 9.9, hemoglobin 10.8, platelets of 283, high sensitive troponin was 12.  Lactic acid was elevated at 3.8.  ED treatment: Cefepime 2 g IV, metronidazole 500 mg IV one-time dose, sodium chloride 500 milliliters bolus, vancomycin.  10/22: Patient did not met sepsis criteria so sepsis ruled out.  Most likely SIRS response with some concern of gastroenteritis. CBC with hemoglobin decreased to 7.4, baseline seems to be around 8, all cell lines decreased most likely dilutional effect with IV fluid.  Preliminary blood cultures negative in 12 hours. COVID and flu PCR negative.  Respiratory viral panel negative.  Procalcitonin remain negative.  BMP with hypokalemia, serum of 2.8 which is being repleted-magnesium at low normal of 1.7.  Patient remained stable with no more nausea or vomiting.  Generalized body aches and pains resolved.  Complaining about left foot pain which is chronic.  No new symptoms or abnormalities, might have some gastroenteritis or nausea and vomiting secondary to fluid and electrolyte shifts with dialysis. No diarrhea.  No need for any antibiotics.  Patient will resume her home medications on discharge and need to have a close follow-up with her providers for further recommendations.  She will continue with her routine dialysis.

## 2021-10-28 NOTE — ED Triage Notes (Signed)
Pt presents to the ED via [facility] due to back pain that radiated to neck. Pt is a dialysis patient. Pt was returning home from diaylsis when the pain occurred. Pt currently denies any pain at the moment. Pt A&Ox4. Denies N/V/D.

## 2021-10-28 NOTE — Consult Note (Signed)
PHARMACY -  BRIEF ANTIBIOTIC NOTE   Pharmacy has received consult(s) for cefepime and vancomycin from an ED provider.  The patient's profile has been reviewed for ht/wt/allergies/indication/available labs.    One time order(s) placed for vancomycin '1750mg'$  IV x1. Cefepime 2g IV x1  Further antibiotics/pharmacy consults should be ordered by admitting physician if indicated.                       Thank you, Darrick Penna 10/28/2021  2:56 PM

## 2021-10-28 NOTE — Assessment & Plan Note (Signed)
Generalized muscle aches with nausea and vomiting - Check 20 pathogen respiratory panel - COVID PCR is in process

## 2021-10-28 NOTE — Assessment & Plan Note (Signed)
-   Insulin SSI with at bedtime coverage ordered - Goal inpatient blood glucose level is 140-180

## 2021-10-28 NOTE — Assessment & Plan Note (Addendum)
vs SIRs - Etiology work-up in progress - Check procalcitonin and is in process - UA and urine culture are in process - Blood cultures x2 are ordered and pending collection - Status post broad-spectrum antibiotic with metronidazole, cefepime, vancomycin per EDP, we will continue with these - Status post sodium chloride 500 mL bolus per EDP - Patient is maintaining appropriate MAP with actually high blood pressure - No further IV fluid bolus indicated at this time - We will continue to monitor blood pressure and map - Admit to telemetry cardiac, observation

## 2021-10-28 NOTE — ED Provider Notes (Signed)
[facility] Provider Note    Event Date/Time   First MD Initiated Contact with Patient 10/28/21 1257     (approximate)   History   Back Pain   HPI  Jennifer Carrillo is a 88 y.o. female here with back pain.  The patient states that she was at dialysis today and did overall well.  She felt at her baseline.  She went home.  On the way home, she began to feel generally weak and like she had diffuse body aches and general weakness.  She went inside.  She states she then began to develop a fairly severe posterior headache and upper back ache with chills, then had several episodes of vomiting and nausea.  She has had a cough as well since then.  Denies any ongoing symptoms.  She states that since she coughed and vomited a small amount of emesis she feels better.  Denies any ongoing abdominal pain.  Denies any recent illness.  Has not had similar symptoms before or after dialysis.  No recent medication changes.     Physical Exam   Triage Vital Signs: ED Triage Vitals  Enc Vitals Group     BP 10/28/21 1235 (!) 170/45     Pulse Rate 10/28/21 1235 (!) 59     Resp 10/28/21 1235 (!) 22     Temp 10/28/21 1235 98.4 F (36.9 C)     Temp Source 10/28/21 1235 Oral     SpO2 10/28/21 1230 100 %     Weight 10/28/21 1237 175 lb 4.3 oz (79.5 kg)     Height 10/28/21 1237 '5\' 7"'$  (1.702 m)     Head Circumference --      Peak Flow --      Pain Score 10/28/21 1236 0     Pain Loc --      Pain Edu? --      Excl. in [place]? --     Most recent vital signs: Vitals:   10/28/21 1235 10/28/21 1600  BP: (!) 170/45 (!) 184/49  Pulse: (!) 59 67  Resp: (!) 22 18  Temp: 98.4 F (36.9 C) 98.3 F (36.8 C)  SpO2: 100% 100%     General: Awake, no distress.  CV:  Good peripheral perfusion.  Resp:  Normal effort.  Lungs clear to auscultation bilaterally. Abd:  No distention.  No tenderness.  No epigastric or right upper quadrant tenderness.  No rebound or guarding. Other:  Moist  mucous membranes.   ED Results / Procedures / Treatments   Labs (all labs ordered are listed, but only abnormal results are displayed) Labs Reviewed  CBC WITH DIFFERENTIAL/PLATELET - Abnormal; Notable for the following components:      Result Value   RBC 3.81 (*)    Hemoglobin 10.8 (*)    HCT 34.9 (*)    Abs Immature Granulocytes 0.13 (*)    All other components within normal limits  COMPREHENSIVE METABOLIC PANEL - Abnormal; Notable for the following components:   Potassium 3.4 (*)    Glucose, Bld 144 (*)    Creatinine, Ser 2.63 (*)    GFR, Estimated 17 (*)    All other components within normal limits  LIPASE, BLOOD - Abnormal; Notable for the following components:   Lipase 58 (*)    All other components within normal limits  LACTIC ACID, PLASMA - Abnormal; Notable for the following components:   Lactic Acid, Venous 3.8 (*)    All other components within normal  limits  URINALYSIS, ROUTINE W REFLEX MICROSCOPIC - Abnormal; Notable for the following components:   Color, Urine STRAW (*)    APPearance CLEAR (*)    Specific Gravity, Urine 1.004 (*)    pH 9.0 (*)    Protein, ur 100 (*)    Bacteria, UA RARE (*)    All other components within normal limits  CBG MONITORING, ED - Abnormal; Notable for the following components:   Glucose-Capillary 121 (*)    All other components within normal limits  SARS CORONAVIRUS 2 BY RT PCR  CULTURE, BLOOD (ROUTINE X 2)  CULTURE, BLOOD (ROUTINE X 2)  URINE CULTURE  RESPIRATORY PANEL BY PCR  LACTIC ACID, PLASMA  PROCALCITONIN  PROCALCITONIN  BASIC METABOLIC PANEL  CBC  TROPONIN I (HIGH SENSITIVITY)  TROPONIN I (HIGH SENSITIVITY)     EKG Sinus bradycardia, ventricular rate 57.  PR 174, QRS 134, QTc 531.  No acute ST elevation or depression.  No acute evidence of acute ischemia or infarct.   RADIOLOGY Chest x-ray: No active disease CT head: Negative CT chest/abdomen/pelvis: degenerative changes diffusely, possible endplate  sclerosis/degeneration at T5-T6, severe arteriosclerosis   I also independently reviewed and agree with radiologist interpretations.   PROCEDURES:  Critical Care performed: Yes, see critical care procedure note(s)  .Critical Care  Performed by: Duffy Bruce, MD Authorized by: Duffy Bruce, MD   Critical care provider statement:    Critical care time (minutes):  30   Critical care time was exclusive of:  Separately billable procedures and treating other patients   Critical care was necessary to treat or prevent imminent or life-threatening deterioration of the following conditions:  Cardiac failure, circulatory failure, respiratory failure and sepsis   Critical care was time spent personally by me on the following activities:  Development of treatment plan with patient or surrogate, discussions with consultants, evaluation of patient's response to treatment, examination of patient, ordering and review of laboratory studies, ordering and review of radiographic studies, ordering and performing treatments and interventions, pulse oximetry, re-evaluation of patient's condition and review of old [place] ED: Medications  acetaminophen (TYLENOL) tablet 650 mg (has no administration in time range)    Or  acetaminophen (TYLENOL) suppository 650 mg (has no administration in time range)  ondansetron (ZOFRAN) tablet 4 mg (has no administration in time range)    Or  ondansetron (ZOFRAN) injection 4 mg (has no administration in time range)  heparin injection 5,000 Units (has no administration in time range)  metroNIDAZOLE (FLAGYL) IVPB 500 mg (500 mg Intravenous New Bag/Given 10/28/21 1604)  hydrALAZINE (APRESOLINE) injection 5 mg (has no administration in time range)  insulin aspart (novoLOG) injection 0-6 Units ( Subcutaneous Not Given 10/28/21 1623)  insulin aspart (novoLOG) injection 0-5 Units (has no administration in time range)  ceFEPIme (MAXIPIME) 2 g in  sodium chloride 0.9 % 100 mL IVPB (has no administration in time range)  vancomycin variable dose per unstable renal function (pharmacist dosing) (has no administration in time range)  anastrozole (ARIMIDEX) tablet 1 mg (has no administration in time range)  amLODipine (NORVASC) tablet 10 mg (has no administration in time range)  isosorbide mononitrate (IMDUR) 24 hr tablet 60 mg (has no administration in time range)  ranolazine (RANEXA) 12 hr tablet 500 mg (has no administration in time range)  donepezil (ARICEPT) tablet 10 mg (has no administration in time range)  docusate sodium (COLACE) capsule 200 mg (has no administration in time range)  clopidogrel (PLAVIX)  tablet 75 mg (has no administration in time range)  sodium chloride 0.9 % bolus 500 mL (500 mLs Intravenous New Bag/Given 10/28/21 1508)  ceFEPIme (MAXIPIME) 2 g in sodium chloride 0.9 % 100 mL IVPB (0 g Intravenous Stopped 10/28/21 1623)  vancomycin (VANCOREADY) IVPB 1750 mg/350 mL (1,750 mg Intravenous New Bag/Given 10/28/21 1715)  gadobutrol (GADAVIST) 1 MMOL/ML injection 7.5 mL (7.5 mLs Intravenous Contrast Given 10/28/21 1851)     IMPRESSION / MDM / ASSESSMENT AND PLAN / ED COURSE  I reviewed the triage vital signs and the nursing notes.                               The patient is on the cardiac monitor to evaluate for evidence of arrhythmia and/or significant heart rate changes.   Ddx:  Differential includes the following, with pertinent life- or limb-threatening emergencies considered:  Acute sepsis with chills/body aches, nausea; ACS, pneumonia, PE, gastritis/GERD, pancreatitis, arrhythmia  Patient's presentation is most consistent with acute presentation with potential threat to life or bodily function.  MDM:  88 yo F with PMHx breast CA, DM, CAD, HTN, ESRD, here with transient weakness, diaphoresis, fatigue, nausea and vomiting. Pt hemodynamically stable here in NAD. Initial labs show no leukocytosis, lytes  overall acceptable for ESRD,  trop normal/at baseline, EKG nonischemic, lipase normal. LA however is significantly elevated concerning for sepsis or transient hypoperfusion. Given age, sx of rigors/chills and lactic acidosis with recent central access during HD, concern for occult bacteremia/sepsis. Given degree of lactic acidosis, CT imaging obtained and shows possible thoracic endplate osteo - will obtain MRI. IV ABX started and IVF given cautiously, will admit to medicine for observation.   MEDICATIONS GIVEN IN ED: Medications  acetaminophen (TYLENOL) tablet 650 mg (has no administration in time range)    Or  acetaminophen (TYLENOL) suppository 650 mg (has no administration in time range)  ondansetron (ZOFRAN) tablet 4 mg (has no administration in time range)    Or  ondansetron (ZOFRAN) injection 4 mg (has no administration in time range)  heparin injection 5,000 Units (has no administration in time range)  metroNIDAZOLE (FLAGYL) IVPB 500 mg (500 mg Intravenous New Bag/Given 10/28/21 1604)  hydrALAZINE (APRESOLINE) injection 5 mg (has no administration in time range)  insulin aspart (novoLOG) injection 0-6 Units ( Subcutaneous Not Given 10/28/21 1623)  insulin aspart (novoLOG) injection 0-5 Units (has no administration in time range)  ceFEPIme (MAXIPIME) 2 g in sodium chloride 0.9 % 100 mL IVPB (has no administration in time range)  vancomycin variable dose per unstable renal function (pharmacist dosing) (has no administration in time range)  anastrozole (ARIMIDEX) tablet 1 mg (has no administration in time range)  amLODipine (NORVASC) tablet 10 mg (has no administration in time range)  isosorbide mononitrate (IMDUR) 24 hr tablet 60 mg (has no administration in time range)  ranolazine (RANEXA) 12 hr tablet 500 mg (has no administration in time range)  donepezil (ARICEPT) tablet 10 mg (has no administration in time range)  docusate sodium (COLACE) capsule 200 mg (has no administration in  time range)  clopidogrel (PLAVIX) tablet 75 mg (has no administration in time range)  sodium chloride 0.9 % bolus 500 mL (500 mLs Intravenous New Bag/Given 10/28/21 1508)  ceFEPIme (MAXIPIME) 2 g in sodium chloride 0.9 % 100 mL IVPB (0 g Intravenous Stopped 10/28/21 1623)  vancomycin (VANCOREADY) IVPB 1750 mg/350 mL (1,750 mg Intravenous New Bag/Given 10/28/21 1715)  gadobutrol (GADAVIST)  1 MMOL/ML injection 7.5 mL (7.5 mLs Intravenous Contrast Given 10/28/21 1851)     Consults:  Hospitalist Nephrology consulted and made aware pt will be receiving MR contrast   EMR reviewed       FINAL CLINICAL IMPRESSION(S) / ED DIAGNOSES   Final diagnoses:  None     Rx / DC Orders   ED Discharge Orders     None        Note:  This document was prepared using Dragon voice recognition software and may include unintentional dictation errors.   Duffy Bruce, MD 10/28/21 [phone]

## 2021-10-28 NOTE — H&P (Addendum)
History and Physical   MAYRANI KHAMIS KCM:[id number] DOB: August 22, 1933 DOA: 10/28/2021  PCP: Mylinda Latina, PA-C  Outpatient Specialists: Dr. Humphrey Rolls, pulmonology Patient coming from: Home via EMS  I have personally briefly reviewed patient's old medical records in [facility].  Chief Concern: Malaise weakness and muscle aches  HPI: Ms. Jennifer Carrillo is an 88 year old female with history of end-stage renal disease on hemodialysis, hypertension, memory decline, insulin-dependent diabetes mellitus, who presents emergency department for chief concerns of generalized muscle aches.  Initial vitals in the emergency department showed temperature of 98.4, respiration rate of 22, heart rate of 59, blood pressure 170/45, SPO2 100% on room air.  Serum sodium is 138, potassium 3.4, chloride 102, bicarb 24, BUN of 17, serum creatinine of 2.62, GFR 17, nonfasting blood glucose 144, WBC 9.9, hemoglobin 10.8, platelets of 283, high sensitive troponin was 12.  Lactic acid was elevated at 3.8.  ED treatment: Cefepime 2 g IV, metronidazole 500 mg IV one-time dose, sodium chloride 500 milliliters bolus, vancomycin. ----------------------- At bedside, she is able to tell me her name, age, current location, and current year. She reports that she was sitting on a stool in the kitchen preparing lunch with grits when her neck, back, and shortness of breath. She felt diaphoretic. She endorses feeling nausea. EMS was called she vomited in the ambulance.   She reports she vomited in the ED as well.  She denies being in pain at this time including chest pain.  She denies current shortness of breath.  Social history: She lives at home with her husband who is at bedside.  She denies tobacco, EtOH, recreational drug use.  She reports she has not smoked or drank alcohol in more than 30 years.  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  ROS: Constitutional: no weight change, no  fever ENT/Mouth: no sore throat, no rhinorrhea Eyes: no eye pain, no vision changes Cardiovascular: no chest pain, + dyspnea,  no edema, no palpitations Respiratory: no cough, no sputum, no wheezing Gastrointestinal: + nausea, + vomiting, no diarrhea, no constipation Genitourinary: no urinary incontinence, no dysuria, no hematuria Musculoskeletal: no arthralgias, no myalgias Skin: no skin lesions, no pruritus, Neuro: + weakness, no loss of consciousness, no syncope Psych: no anxiety, no depression, no decrease appetite Heme/Lymph: no bruising, no bleeding  ED Course: Discussed with emergency medicine provider, patient requiring hospitalization for chief concerns of SIRS/sepsis.  Assessment/Plan  Principal Problem:   Sepsis ([place]) Active Problems:   Malignant neoplasm of breast in female, estrogen receptor positive ([place])   Primary osteoarthritis of both knees   Diabetes mellitus without complication ([place])   Renovascular hypertension   PAD (peripheral artery disease) (HCC)   Coronary artery disease involving native coronary artery of native heart   Hypertension   ESRD (end stage renal disease) (HCC)   Anemia in chronic kidney disease, on chronic dialysis (HCC)   Gastroenteritis   Assessment and Plan:  * Sepsis ([place]) vs SIRs - Etiology work-up in progress - Check procalcitonin and is in process - UA and urine culture are in process - Blood cultures x2 are ordered and pending collection - Status post broad-spectrum antibiotic with metronidazole, cefepime, vancomycin per EDP, we will continue with these - Status post sodium chloride 500 mL bolus per EDP - Patient is maintaining appropriate MAP with actually high blood pressure - No further IV fluid bolus indicated at this time - We will continue to monitor blood pressure and  map - Admit to telemetry cardiac, observation  Gastroenteritis Generalized muscle aches with nausea and vomiting - Check 20 pathogen respiratory  panel - COVID PCR is in process  ESRD (end stage renal disease) ([place]) - Patient completed dialysis on day of admission prior to presenting to the ED  Hypertension - Patient takes amlodipine 10 mg daily, presumably for 10/29/2021 - Hydralazine 5 mg IV every 8 hours as needed for SBP greater than 180, 3 days ordered  Coronary artery disease involving native coronary artery of native heart - Ranolazine 500 mg p.o. twice daily, Imdur 60 mg daily - Plavix 75 mg daily resumed  Diabetes mellitus without complication (HCC) - Insulin SSI with at bedtime coverage ordered - Goal inpatient blood glucose level is 140-180  Malignant neoplasm of breast in female, estrogen receptor positive (HCC) - Resumed anastrozole 1 mg daily after breakfast  Chart reviewed.   DVT prophylaxis: Heparin 5000 units subcutaneous Code Status: Full code Diet: Renal/carb modified Family Communication: Updated husband at bedside with patient's permission Disposition Plan: Pending clinical course anticipate less than 2 night stay Consults called: None at this time Admission status: Telemetry cardiac, observation  Past Medical History:  Diagnosis Date   Arthritis    Asthma    Calf pain    Cancer ([place]) 2016   left breast   COPD (chronic obstructive pulmonary disease) (HCC)    Diabetes ([place])    Discoid lupus    Gastro-esophageal reflux    Glaucoma    Hyperlipemia    Hypertension    Iron deficiency anemia    Joint pain    Leg swelling    Osteoarthritis    PVD (peripheral vascular disease) ([place])    Sinus problem    Sleep apnea    No CPAP/ Can't tolerate   Stroke ([place]) 1987   Past Surgical History:  Procedure Laterality Date   BREAST SURGERY Left    left lumpectomy   CAROTID ANGIOGRAPHY Left 06/25/2018   Procedure: CAROTID ANGIOGRAPHY, possible intervention;  Surgeon: Katha Cabal, MD;  Location: [facility];  Service: Cardiovascular;  Laterality: Left;   CAROTID ENDARTERECTOMY Right     CAROTID PTA/STENT INTERVENTION Left 06/25/2018   Procedure: CAROTID PTA/STENT INTERVENTION;  Surgeon: Katha Cabal, MD;  Location: [facility];  Service: Cardiovascular;  Laterality: Left;   CATARACT EXTRACTION Right 2013   CORONARY STENT PLACEMENT     L. L. E.   DIALYSIS/PERMA CATHETER INSERTION N/A 12/29/2020   Procedure: DIALYSIS/PERMA CATHETER INSERTION;  Surgeon: Algernon Huxley, MD;  Location: [facility];  Service: Cardiovascular;  Laterality: N/A;   EYE SURGERY Bilateral    cataract   LEFT HEART CATH AND CORONARY ANGIOGRAPHY Left 05/06/2018   Procedure: LEFT HEART CATH AND CORONARY ANGIOGRAPHY;  Surgeon: Corey Skains, MD;  Location: [facility];  Service: Cardiovascular;  Laterality: Left;   LOWER EXTREMITY ANGIOGRAPHY Right 01/22/2018   Procedure: LOWER EXTREMITY ANGIOGRAPHY;  Surgeon: Katha Cabal, MD;  Location: [facility];  Service: Cardiovascular;  Laterality: Right;   RENAL ANGIOGRAPHY Left 02/26/2018   Procedure: RENAL ANGIOGRAPHY;  Surgeon: Katha Cabal, MD;  Location: [facility];  Service: Cardiovascular;  Laterality: Left;   STENT PLACEMENT VASCULAR ([facility] H[facility]) Left    stent placement on LLE   TUBAL LIGATION     Social History:  reports that she quit smoking about 36 years ago. Her smoking use included cigarettes. She has never used smokeless  tobacco. She reports that she does not drink alcohol and does not use drugs.  Allergies  Allergen Reactions   Benazepril Nausea Only   Family History  Problem Relation Age of Onset   Heart disease Mother    Diabetes Sister    Leukemia Daughter    Lung cancer Brother    Diabetes Other    Hypertension Other    Bladder Cancer Neg Hx    Kidney disease Neg Hx    Prostate cancer Neg Hx    Breast cancer Neg Hx    Family history: Family history reviewed and not pertinent  Prior to Admission medications   Medication Sig Start Date End Date Taking? Authorizing  Provider  ACCU-CHEK GUIDE test strip TEST BLOOD SUGAR THREE TIMES DAILY  AS  DIRECTED 04/27/20   [name], MD  Accu-Chek Softclix Lancets lancets USE AS INSTRUCTED 3 TIMES A DAY 09/22/19   [name], MD  acetaminophen (TYLENOL) 500 MG tablet Take 1 tablet (500 mg total) by mouth every 6 (six) hours as needed for moderate pain or headache. 12/31/20   Fritzi Mandes, MD  Alcohol Swabs (B-D SINGLE USE SWABS REGULAR) PADS Use as directed for 3 times daily DX E11.65 06/22/19   Kendell Bane, NP  amLODipine (NORVASC) 10 MG tablet TAKE 1 TABLET EVERY DAY AT [facility] 06/14/21   [name], MD  anastrozole (ARIMIDEX) 1 MG tablet TAKE 1 TABLET EVERY DAY IN THE AFTERNOON (DINNER) 06/14/21   [name], MD  Blood Glucose Monitoring Suppl (ACCU-CHEK GUIDE ME) w/Device KIT Use as instructed. DX e11.65 06/18/19   Kendell Bane, NP  cholecalciferol (VITAMIN D) 25 MCG (1000 UNIT) tablet TAKE 1 TABLET EVERY MONDAY, WEDNESDAY, AND FRIDAY. 11/04/19   Luiz Ochoa, NP  clopidogrel (PLAVIX) 75 MG tablet TAKE 1 TABLET DAILY AT [facility] 05/26/21   McDonough, Lauren K, PA-C  docusate sodium (COLACE) 100 MG capsule Take 200 mg by mouth at bedtime as needed for mild constipation.    [provider]  donepezil (ARICEPT) 10 MG tablet TAKE 1 TABLET AT BEDTIME FOR MEMORY (NEED MD APPOINTMENT) 08/28/21   McDonough, Si Gaul, PA-C  fluticasone-salmeterol (ADVAIR) 100-50 MCG/ACT AEPB Inhale 1 puff into the lungs 2 (two) times daily. Patient taking differently: Inhale 1 puff into the lungs 2 (two) times daily as needed. 07/19/20   [name], MD  gabapentin (NEURONTIN) 100 MG capsule Take 1 capsule (100 mg total) by mouth 3 (three) times daily. 12/05/20   McDonough, Si Gaul, PA-C  hydrALAZINE (APRESOLINE) 50 MG tablet Take 100 mg by mouth in the morning and at bedtime. 12/12/20 12/12/21  [provider]  insulin NPH-regular Human (70-30) 100 UNIT/ML injection Inject 12 Units into the skin 2 (two) times  daily. Inject 20 units subcutaneously in the morning and 20 units in the evening. 12/31/20   Fritzi Mandes, MD  isosorbide mononitrate (IMDUR) 60 MG 24 hr tablet Take 60 mg by mouth daily. 12/13/20   [provider]  ranolazine (RANEXA) 500 MG 12 hr tablet Take 500 mg by mouth 2 (two) times daily; breakfast, Afternoon (dinner) 08/16/20   [name], MD   Physical Exam: Vitals:   10/28/21 1230 10/28/21 1235 10/28/21 1237 10/28/21 1600  BP:  (!) 170/45  (!) 184/49  Pulse:  (!) 59  67  Resp:  (!) 22  18  Temp:  98.4 F (36.9 C)  98.3 F (36.8 C)  TempSrc:  Oral  Oral  SpO2: 100% 100%  100%  Weight:   79.5 kg   Height:   '5\' 7"'  (1.702 m)    Constitutional: appears age-appropriate, NAD, calm, comfortable Eyes: PERRL, lids and conjunctivae normal ENMT: Mucous membranes are moist. Posterior pharynx clear of any exudate or lesions. Age-appropriate dentition. Hearing appropriate Neck: normal, supple, no masses, no thyromegaly Respiratory: clear to auscultation bilaterally, no wheezing, no crackles. Normal respiratory effort. No accessory muscle use.  Cardiovascular: Regular rate and rhythm, no murmurs / rubs / gallops. No extremity edema. 2+ pedal pulses. No carotid bruits.  Abdomen: Obese abdomen, no tenderness, no masses palpated, no hepatosplenomegaly. Bowel sounds positive.  Musculoskeletal: no clubbing / cyanosis. No joint deformity upper and lower extremities. Good ROM, no contractures, no atrophy. Normal muscle tone.  Skin: no rashes, lesions, ulcers. No induration Neurologic: Sensation intact. Strength 5/5 in all 4.  Psychiatric: Normal judgment and insight. Alert and oriented x 3. Normal mood.   EKG: independently reviewed, showing sinus bradycardia with rate of 57, right bundle branch block, QTc 531  Chest x-ray on Admission: I personally reviewed and I agree with radiologist reading as below.  CT CHEST ABDOMEN PELVIS WO CONTRAST  Result Date: 10/28/2021 CLINICAL DATA:   Sepsis EXAM: CT CHEST, ABDOMEN AND PELVIS WITHOUT CONTRAST TECHNIQUE: Multidetector CT imaging of the chest, abdomen and pelvis was performed following the standard protocol without IV contrast. RADIATION DOSE REDUCTION: This exam was performed according to the departmental dose-optimization program which includes automated exposure control, adjustment of the mA and/or kV according to patient size and/or use of iterative reconstruction technique. COMPARISON:  CT abdomen and pelvis done on 09/19/2006 FINDINGS: CT CHEST FINDINGS Cardiovascular: Coronary artery calcifications are seen. There are coarse calcifications in thoracic aorta and its major branches. Right IJ dialysis catheter is seen with its tip at the junction of superior vena cava and right atrium. Mediastinum/Nodes: No significant lymphadenopathy seen. There is slightly inhomogeneous attenuation in thyroid. Lungs/Pleura: There is no focal pulmonary consolidation. There is no pleural effusion or pneumothorax. Musculoskeletal: Degenerative changes are noted in thoracic spine, more severe at T5-T6 level. There is endplate sclerosis without any break in the cortical margins. CT ABDOMEN PELVIS FINDINGS Hepatobiliary: No focal abnormalities are seen in liver. Gallbladder is unremarkable. There is no dilation of bile ducts. Pancreas: No focal abnormalities are seen. Spleen: Unremarkable. Adrenals/Urinary Tract: There is hyperplasia of left adrenal with no significant interval change. There is no hydronephrosis. There are multiple calcifications in renal artery branches. No definite renal or ureteral stones are seen. There are few small smooth marginated low-density lesions suggesting renal cysts largest measuring 1.9 cm in size in the midportion of right kidney. Urinary bladder is unremarkable. Stomach/Bowel: Stomach is unremarkable. Small bowel loops are not dilated. Appendix is not dilated. Multiple diverticula are seen in colon without signs of focal acute  diverticulitis. Vascular/Lymphatic: Extensive arterial calcifications are seen. There is vascular stent in proximal left renal artery. Vascular stents are seen in common iliac arteries on both sides. Reproductive: Unremarkable. Other: There is no ascites or pneumoperitoneum. Umbilical hernia containing fat is seen. Musculoskeletal: Degenerative changes are noted in thoracic and lumbar spine, more severe at T5-T6 level. There is encroachment of neural foramina at multiple levels in lumbar spine. IMPRESSION: No acute findings are seen in noncontrast CT chest, abdomen and pelvis. Degenerative changes are noted in thoracic and lumbar spine. There is significant endplate sclerosis adjacent to T5-T6 disc space. There is no break in the cortical margins. This finding is most  likely due to severe disc degeneration. Less likely possibility would be infectious process. Please correlate with clinical and laboratory findings. Severe arteriosclerosis with multiple vascular stents. Coronary artery calcifications are seen. Diverticulosis of colon without signs of diverticulitis. Renal cysts. Other findings as described in the body of the report. Electronically Signed   By: Elmer Picker M.D.   On: 10/28/2021 15:36   CT HEAD WO CONTRAST (5MM)  Result Date: 10/28/2021 CLINICAL DATA:  Headache EXAM: CT HEAD WITHOUT CONTRAST TECHNIQUE: Contiguous axial images were obtained from the base of the skull through the vertex without intravenous contrast. RADIATION DOSE REDUCTION: This exam was performed according to the departmental dose-optimization program which includes automated exposure control, adjustment of the mA and/or kV according to patient size and/or use of iterative reconstruction technique. COMPARISON:  CT head dated 06/25/2018 FINDINGS: Brain: No evidence of acute infarction, hemorrhage, hydrocephalus, extra-axial collection or mass lesion/mass effect. Slight enlargement of the lateral ventricles in keeping with  age-related diffuse parenchymal atrophy with sequela of chronic microvascular ischemic changes. Similar left thalamic lacunar infarct. Vascular: No hyperdense vessel or unexpected calcification. Skull: No fracture or focal lesion. Sinuses/Orbits: Bilateral pseudophakia. Trace mucosal thickening of the right maxillary sinus. Other: None. IMPRESSION: No intracranial hemorrhage, large territory infarction, or mass effect. Electronically Signed   By: Darrin Nipper M.D.   On: 10/28/2021 14:59   DG Chest 2 View  Result Date: 10/28/2021 CLINICAL DATA:  Shortness of breath. Chest and back pain. On dialysis. EXAM: CHEST - 2 VIEW COMPARISON:  12/18/2020 FINDINGS: Right jugular dual-lumen central venous catheter is seen in appropriate position. The heart size and mediastinal contours are within normal limits. Aortic atherosclerotic calcification incidentally noted. Both lungs are clear. Surgical clips again seen in the left lower anterior chest wall. The visualized skeletal structures are unremarkable. IMPRESSION: No active cardiopulmonary disease. Electronically Signed   By: Marlaine Hind M.D.   On: 10/28/2021 13:34    Labs on Admission: I have personally reviewed following labs  CBC: Recent Labs  Lab 10/28/21 1401  WBC 9.9  NEUTROABS 7.2  HGB 10.8*  HCT 34.9*  MCV 91.6  PLT 716   Basic Metabolic Panel: Recent Labs  Lab 10/28/21 1401  NA 138  K 3.4*  CL 102  CO2 24  GLUCOSE 144*  BUN 17  CREATININE 2.63*  CALCIUM 9.4   GFR: Estimated Creatinine Clearance: 16.7 mL/min (A) (by C-G formula based on SCr of 2.63 mg/dL (H)).  Liver Function Tests: Recent Labs  Lab 10/28/21 1401  AST 32  ALT 21  ALKPHOS 100  BILITOT 0.3  PROT 7.5  ALBUMIN 4.0   Recent Labs  Lab 10/28/21 1401  LIPASE 58*   Urine analysis:    Component Value Date/Time   COLORURINE STRAW (A) 10/28/2021 1532   APPEARANCEUR CLEAR (A) 10/28/2021 1532   APPEARANCEUR Clear 03/24/2020 0954   LABSPEC 1.004 (L) 10/28/2021  1532   PHURINE 9.0 (H) 10/28/2021 1532   GLUCOSEU NEGATIVE 10/28/2021 1532   HGBUR NEGATIVE 10/28/2021 [place] 10/28/2021 1532   BILIRUBINUR Negative 03/24/2020 0954   KETONESUR NEGATIVE 10/28/2021 1532   PROTEINUR 100 (A) 10/28/2021 1532   UROBILINOGEN 0.2 12/17/2017 0931   NITRITE NEGATIVE 10/28/2021 1532   LEUKOCYTESUR NEGATIVE 10/28/2021 1532   Dr. Tobie Poet [facility]  If 7PM-7AM, please contact overnight-coverage provider If 7AM-7PM, please contact day coverage provider [email or web address]  10/28/2021, 4:42 PM

## 2021-10-29 DIAGNOSIS — D631 Anemia in chronic kidney disease: Secondary | ICD-10-CM | POA: Diagnosis not present

## 2021-10-29 DIAGNOSIS — M546 Pain in thoracic spine: Secondary | ICD-10-CM | POA: Insufficient documentation

## 2021-10-29 DIAGNOSIS — N186 End stage renal disease: Secondary | ICD-10-CM | POA: Diagnosis not present

## 2021-10-29 DIAGNOSIS — E119 Type 2 diabetes mellitus without complications: Secondary | ICD-10-CM | POA: Diagnosis not present

## 2021-10-29 DIAGNOSIS — I10 Essential (primary) hypertension: Secondary | ICD-10-CM | POA: Diagnosis not present

## 2021-10-29 DIAGNOSIS — K529 Noninfective gastroenteritis and colitis, unspecified: Secondary | ICD-10-CM | POA: Diagnosis not present

## 2021-10-29 DIAGNOSIS — Z992 Dependence on renal dialysis: Secondary | ICD-10-CM | POA: Diagnosis not present

## 2021-10-29 DIAGNOSIS — I2510 Atherosclerotic heart disease of native coronary artery without angina pectoris: Secondary | ICD-10-CM | POA: Diagnosis not present

## 2021-10-29 DIAGNOSIS — A419 Sepsis, unspecified organism: Secondary | ICD-10-CM | POA: Diagnosis not present

## 2021-10-29 LAB — BASIC METABOLIC PANEL
Anion gap: 6 (ref 5–15)
BUN: 22 mg/dL (ref 8–23)
CO2: 21 mmol/L — ABNORMAL LOW (ref 22–32)
Calcium: 7.6 mg/dL — ABNORMAL LOW (ref 8.9–10.3)
Chloride: 116 mmol/L — ABNORMAL HIGH (ref 98–111)
Creatinine, Ser: 2.91 mg/dL — ABNORMAL HIGH (ref 0.44–1.00)
GFR, Estimated: 15 mL/min — ABNORMAL LOW (ref >60)
Glucose, Bld: 98 mg/dL (ref 70–99)
Potassium: 2.8 mmol/L — ABNORMAL LOW (ref 3.5–5.1)
Sodium: 143 mmol/L (ref 135–145)

## 2021-10-29 LAB — CBC
HCT: 24.7 % — ABNORMAL LOW (ref 36.0–46.0)
Hemoglobin: 7.4 g/dL — ABNORMAL LOW (ref 12.0–15.0)
MCH: 28 pg (ref 26.0–34.0)
MCHC: 30 g/dL (ref 30.0–36.0)
MCV: 93.6 fL (ref 80.0–100.0)
Platelets: 206 10*3/uL (ref 150–400)
RBC: 2.64 MIL/uL — ABNORMAL LOW (ref 3.87–5.11)
RDW: 15.4 % (ref 11.5–15.5)
WBC: 5.8 10*3/uL (ref 4.0–10.5)
nRBC: 0 % (ref 0.0–0.2)

## 2021-10-29 LAB — PROCALCITONIN: Procalcitonin: <0.1 ng/mL

## 2021-10-29 LAB — URINE CULTURE

## 2021-10-29 LAB — CBG MONITORING, ED: Glucose-Capillary: 122 mg/dL — ABNORMAL HIGH (ref 70–99)

## 2021-10-29 LAB — MAGNESIUM: Magnesium: 1.7 mg/dL (ref 1.7–2.4)

## 2021-10-29 MED ORDER — MAGNESIUM SULFATE 2 GM/50ML IV SOLN
2.0000 g | Freq: Once | INTRAVENOUS | Status: AC
  Administered 2021-10-29: 2 g via INTRAVENOUS
  Filled 2021-10-29: qty 50

## 2021-10-29 MED ORDER — POTASSIUM CHLORIDE CRYS ER 20 MEQ PO TBCR
40.0000 meq | EXTENDED_RELEASE_TABLET | Freq: Once | ORAL | Status: AC
  Administered 2021-10-29: 40 meq via ORAL
  Filled 2021-10-29: qty 2

## 2021-10-29 MED ORDER — ONDANSETRON HCL 4 MG PO TABS: 4.0000 mg | ORAL_TABLET | Freq: Four times a day (QID) | ORAL | 0 refills | Status: DC | PRN

## 2021-10-29 NOTE — Discharge Summary (Signed)
Physician Discharge Summary   Patient: Jennifer Carrillo MRN: [id number] DOB: Oct 12, 1933  Admit date:     10/28/2021  Discharge date: 10/29/21  Discharge Physician: Lorella Nimrod   PCP: Mylinda Latina, PA-C   Recommendations at discharge:  Continue with routine dialysis Follow-up with primary care provider  Discharge Diagnoses: Principal Problem:   Sepsis [facility]) Active Problems:   Malignant neoplasm of breast in female, estrogen receptor positive ([place])   Primary osteoarthritis of both knees   Diabetes mellitus without complication ([place])   Renovascular hypertension   PAD (peripheral artery disease) ([place])   Coronary artery disease involving native coronary artery of native heart   Hypertension   ESRD (end stage renal disease) ([place])   Anemia in chronic kidney disease, on chronic dialysis [facility])   Gastroenteritis   Hospital Course: Ms. Jennifer Carrillo is an 88 year old female with history of end-stage renal disease on hemodialysis, hypertension, memory decline, insulin-dependent diabetes mellitus, who presents emergency department for chief concerns of sudden onset generalized muscle aches for 1 day, also endorsed some nausea and vomiting.  Had dialysis before presenting to the ED.  Initial vitals in the emergency department showed temperature of 98.4, respiration rate of 22, heart rate of 59, blood pressure 170/45, SPO2 100% on room air.  Serum sodium is 138, potassium 3.4, chloride 102, bicarb 24, BUN of 17, serum creatinine of 2.62, GFR 17, nonfasting blood glucose 144, WBC 9.9, hemoglobin 10.8, platelets of 283, high sensitive troponin was 12.  Lactic acid was elevated at 3.8.  ED treatment: Cefepime 2 g IV, metronidazole 500 mg IV one-time dose, sodium chloride 500 milliliters bolus, vancomycin.  10/22: Patient did not met sepsis criteria so sepsis ruled out.  Most likely SIRS response with some concern of gastroenteritis. CBC with hemoglobin decreased to 7.4, baseline seems  to be around 8, all cell lines decreased most likely dilutional effect with IV fluid.  Preliminary blood cultures negative in 12 hours. COVID and flu PCR negative.  Respiratory viral panel negative.  Procalcitonin remain negative.  BMP with hypokalemia, serum of 2.8 which is being repleted-magnesium at low normal of 1.7.  Patient remained stable with no more nausea or vomiting.  Generalized body aches and pains resolved.  Complaining about left foot pain which is chronic.  No new symptoms or abnormalities, might have some gastroenteritis or nausea and vomiting secondary to fluid and electrolyte shifts with dialysis. No diarrhea.  No need for any antibiotics.  Patient will resume her home medications on discharge and need to have a close follow-up with her providers for further recommendations.  She will continue with her routine dialysis.   Assessment and Plan: * Sepsis ([place]) vs SIRs - Etiology work-up in progress - Check procalcitonin and is in process - UA and urine culture are in process - Blood cultures x2 are ordered and pending collection - Status post broad-spectrum antibiotic with metronidazole, cefepime, vancomycin per EDP, we will continue with these - Status post sodium chloride 500 mL bolus per EDP - Patient is maintaining appropriate MAP with actually high blood pressure - No further IV fluid bolus indicated at this time - We will continue to monitor blood pressure and map - Admit to telemetry cardiac, observation  Gastroenteritis Generalized muscle aches with nausea and vomiting - Check 20 pathogen respiratory panel - COVID PCR is in process  ESRD (end stage renal disease) ([place]) - Patient completed dialysis on day of admission prior to presenting to the ED  Hypertension - Patient takes  amlodipine 10 mg daily, presumably for 10/29/2021 - Hydralazine 5 mg IV every 8 hours as needed for SBP greater than 180, 3 days ordered  Coronary artery disease involving native  coronary artery of native heart - Ranolazine 500 mg p.o. twice daily, Imdur 60 mg daily - Plavix 75 mg daily resumed  Diabetes mellitus without complication (HCC) - Insulin SSI with at bedtime coverage ordered - Goal inpatient blood glucose level is 140-180  Malignant neoplasm of breast in female, estrogen receptor positive ([place]) - Resumed anastrozole 1 mg daily after breakfast  Consultants: Nephrology Procedures performed: None Disposition: Home Diet recommendation:  Discharge Diet Orders (From admission, onward)     Start     Ordered   10/29/21 0000  Diet - low sodium heart healthy        10/29/21 1056           Cardiac and Carb modified diet DISCHARGE MEDICATION: Allergies as of 10/29/2021       Reactions   Benazepril Nausea Only        Medication List     STOP taking these medications    gabapentin 100 MG capsule Commonly known as: NEURONTIN       TAKE these medications    Accu-Chek Guide Me w/Device Kit Use as instructed. DX e11.65   Accu-Chek Guide test strip Generic drug: glucose blood TEST BLOOD SUGAR THREE TIMES DAILY  AS  DIRECTED   Accu-Chek Softclix Lancets lancets USE AS INSTRUCTED 3 TIMES A DAY   acetaminophen 500 MG tablet Commonly known as: TYLENOL Take 1 tablet (500 mg total) by mouth every 6 (six) hours as needed for moderate pain or headache.   amLODipine 10 MG tablet Commonly known as: NORVASC TAKE 1 TABLET EVERY DAY AT BREAKFAST   anastrozole 1 MG tablet Commonly known as: ARIMIDEX TAKE 1 TABLET EVERY DAY IN THE AFTERNOON (DINNER)   B-D SINGLE USE SWABS REGULAR Pads Use as directed for 3 times daily DX E11.65   cholecalciferol 25 MCG (1000 UNIT) tablet Commonly known as: VITAMIN D3 TAKE 1 TABLET EVERY MONDAY, WEDNESDAY, AND FRIDAY.   clopidogrel 75 MG tablet Commonly known as: PLAVIX TAKE 1 TABLET DAILY AT DINNER   docusate sodium 100 MG capsule Commonly known as: COLACE Take 200 mg by mouth at bedtime as needed  for mild constipation.   donepezil 10 MG tablet Commonly known as: ARICEPT TAKE 1 TABLET AT BEDTIME FOR MEMORY (NEED MD APPOINTMENT)   fluticasone-salmeterol 100-50 MCG/ACT Aepb Commonly known as: ADVAIR Inhale 1 puff into the lungs 2 (two) times daily.   hydrALAZINE 50 MG tablet Commonly known as: APRESOLINE Take 100 mg by mouth in the morning and at bedtime.   insulin NPH-regular Human (70-30) 100 UNIT/ML injection Inject 12 Units into the skin 2 (two) times daily. Inject 20 units subcutaneously in the morning and 20 units in the evening. What changed: how much to take   isosorbide mononitrate 60 MG 24 hr tablet Commonly known as: IMDUR Take 60 mg by mouth daily.   ondansetron 4 MG tablet Commonly known as: ZOFRAN Take 1 tablet (4 mg total) by mouth every 6 (six) hours as needed for nausea.   ranolazine 500 MG 12 hr tablet Commonly known as: RANEXA Take 500 mg by mouth 2 (two) times daily; breakfast, Afternoon (dinner)        Discharge Exam: Filed Weights   10/28/21 1237  Weight: 79.5 kg   General.  Frail elderly lady, in no acute distress. Pulmonary.  Lungs clear bilaterally, normal respiratory effort. CV.  Regular rate and rhythm, no JVD, rub or murmur. Abdomen.  Soft, nontender, nondistended, BS positive. CNS.  Alert and oriented .  No focal neurologic deficit. Extremities.  No edema, no cyanosis, pulses intact and symmetrical. Psychiatry.  Judgment and insight appears normal.   Condition at discharge: stable  The results of significant diagnostics from this hospitalization (including imaging, microbiology, ancillary and laboratory) are listed below for reference.   Imaging Studies: MR THORACIC SPINE W WO CONTRAST  Result Date: 10/28/2021 CLINICAL DATA:  Sudden onset of mid back pain after dialysis. EXAM: MRI THORACIC WITHOUT AND WITH CONTRAST TECHNIQUE: Multiplanar and multiecho pulse sequences of the thoracic spine were obtained without and with  intravenous contrast. CONTRAST:  7.40m GADAVIST GADOBUTROL 1 MMOL/ML IV SOLN COMPARISON:  None Available. FINDINGS: Alignment: No significant listhesis is present. Normal thoracic kyphosis is present. Vertebrae: Mixed edematous and fatty endplate changes are present at T5-6 and to a lesser extent at T6-7. Chronic fatty endplate marrow changes are noted anteriorly at T11-12. Marrow signal and vertebral body heights are otherwise normal. Cord:  Normal signal and morphology. Paraspinal and other soft tissues: Visualized lung bases are clear. Visualized mediastinum is unremarkable. Limited imaging the abdomen is unremarkable. There is no significant adenopathy. No solid organ lesions are present. Disc levels: No significant central disc protrusion or stenosis is present. Foramina are patent bilaterally. IMPRESSION: 1. No acute or focal lesion to explain the patient's symptoms. 2. Mixed edematous and fatty endplate changes at TE3-1and to a lesser extent at T6-7. Findings are most consistent with focal endplate degenerative change. 3. Chronic fatty endplate marrow changes anteriorly at T11-12. 4. No significant central disc protrusion or stenosis. Electronically Signed   By: CSan MorelleM.D.   On: 10/28/2021 19:33   CT CHEST ABDOMEN PELVIS WO CONTRAST  Result Date: 10/28/2021 CLINICAL DATA:  Sepsis EXAM: CT CHEST, ABDOMEN AND PELVIS WITHOUT CONTRAST TECHNIQUE: Multidetector CT imaging of the chest, abdomen and pelvis was performed following the standard protocol without IV contrast. RADIATION DOSE REDUCTION: This exam was performed according to the departmental dose-optimization program which includes automated exposure control, adjustment of the mA and/or kV according to patient size and/or use of iterative reconstruction technique. COMPARISON:  CT abdomen and pelvis done on 09/19/2006 FINDINGS: CT CHEST FINDINGS Cardiovascular: Coronary artery calcifications are seen. There are coarse calcifications in  thoracic aorta and its major branches. Right IJ dialysis catheter is seen with its tip at the junction of superior vena cava and right atrium. Mediastinum/Nodes: No significant lymphadenopathy seen. There is slightly inhomogeneous attenuation in thyroid. Lungs/Pleura: There is no focal pulmonary consolidation. There is no pleural effusion or pneumothorax. Musculoskeletal: Degenerative changes are noted in thoracic spine, more severe at T5-T6 level. There is endplate sclerosis without any break in the cortical margins. CT ABDOMEN PELVIS FINDINGS Hepatobiliary: No focal abnormalities are seen in liver. Gallbladder is unremarkable. There is no dilation of bile ducts. Pancreas: No focal abnormalities are seen. Spleen: Unremarkable. Adrenals/Urinary Tract: There is hyperplasia of left adrenal with no significant interval change. There is no hydronephrosis. There are multiple calcifications in renal artery branches. No definite renal or ureteral stones are seen. There are few small smooth marginated low-density lesions suggesting renal cysts largest measuring 1.9 cm in size in the midportion of right kidney. Urinary bladder is unremarkable. Stomach/Bowel: Stomach is unremarkable. Small bowel loops are not dilated. Appendix is not dilated. Multiple diverticula are seen in colon without signs of  focal acute diverticulitis. Vascular/Lymphatic: Extensive arterial calcifications are seen. There is vascular stent in proximal left renal artery. Vascular stents are seen in common iliac arteries on both sides. Reproductive: Unremarkable. Other: There is no ascites or pneumoperitoneum. Umbilical hernia containing fat is seen. Musculoskeletal: Degenerative changes are noted in thoracic and lumbar spine, more severe at T5-T6 level. There is encroachment of neural foramina at multiple levels in lumbar spine. IMPRESSION: No acute findings are seen in noncontrast CT chest, abdomen and pelvis. Degenerative changes are noted in thoracic  and lumbar spine. There is significant endplate sclerosis adjacent to T5-T6 disc space. There is no break in the cortical margins. This finding is most likely due to severe disc degeneration. Less likely possibility would be infectious process. Please correlate with clinical and laboratory findings. Severe arteriosclerosis with multiple vascular stents. Coronary artery calcifications are seen. Diverticulosis of colon without signs of diverticulitis. Renal cysts. Other findings as described in the body of the report. Electronically Signed   By: Elmer Picker M.D.   On: 10/28/2021 15:36   CT HEAD WO CONTRAST (5MM)  Result Date: 10/28/2021 CLINICAL DATA:  Headache EXAM: CT HEAD WITHOUT CONTRAST TECHNIQUE: Contiguous axial images were obtained from the base of the skull through the vertex without intravenous contrast. RADIATION DOSE REDUCTION: This exam was performed according to the departmental dose-optimization program which includes automated exposure control, adjustment of the mA and/or kV according to patient size and/or use of iterative reconstruction technique. COMPARISON:  CT head dated 06/25/2018 FINDINGS: Brain: No evidence of acute infarction, hemorrhage, hydrocephalus, extra-axial collection or mass lesion/mass effect. Slight enlargement of the lateral ventricles in keeping with age-related diffuse parenchymal atrophy with sequela of chronic microvascular ischemic changes. Similar left thalamic lacunar infarct. Vascular: No hyperdense vessel or unexpected calcification. Skull: No fracture or focal lesion. Sinuses/Orbits: Bilateral pseudophakia. Trace mucosal thickening of the right maxillary sinus. Other: None. IMPRESSION: No intracranial hemorrhage, large territory infarction, or mass effect. Electronically Signed   By: Darrin Nipper M.D.   On: 10/28/2021 14:59   DG Chest 2 View  Result Date: 10/28/2021 CLINICAL DATA:  Shortness of breath. Chest and back pain. On dialysis. EXAM: CHEST - 2 VIEW  COMPARISON:  12/18/2020 FINDINGS: Right jugular dual-lumen central venous catheter is seen in appropriate position. The heart size and mediastinal contours are within normal limits. Aortic atherosclerotic calcification incidentally noted. Both lungs are clear. Surgical clips again seen in the left lower anterior chest wall. The visualized skeletal structures are unremarkable. IMPRESSION: No active cardiopulmonary disease. Electronically Signed   By: Marlaine Hind M.D.   On: 10/28/2021 13:34    Microbiology: Results for orders placed or performed during the hospital encounter of 10/28/21  SARS Coronavirus 2 by RT PCR (hospital order, performed in [facility] hospital lab) *cepheid single result test* Urine, Clean Catch     Status: None   Collection Time: 10/28/21  3:32 PM   Specimen: Urine, Clean Catch; Nasal Swab  Result Value Ref Range Status   SARS Coronavirus 2 by RT PCR NEGATIVE NEGATIVE Final    Comment: (NOTE) SARS-CoV-2 target nucleic acids are NOT DETECTED.  The SARS-CoV-2 RNA is generally detectable in upper and lower respiratory specimens during the acute phase of infection. The lowest concentration of SARS-CoV-2 viral copies this assay can detect is 250 copies / mL. A negative result does not preclude SARS-CoV-2 infection and should not be used as the sole basis for treatment or other patient management decisions.  A negative result may occur  with improper specimen collection / handling, submission of specimen other than nasopharyngeal swab, presence of viral mutation(s) within the areas targeted by this assay, and inadequate number of viral copies (<250 copies / mL). A negative result must be combined with clinical observations, patient history, and epidemiological information.  Fact Sheet for Patients:   https://www.patel.info/  Fact Sheet for Healthcare Providers: https://hall.com/  This test is not yet approved or  cleared by the  [place] FDA and has been authorized for detection and/or diagnosis of SARS-CoV-2 by FDA under an Emergency Use Authorization (EUA).  This EUA will remain in effect (meaning this test can be used) for the duration of the COVID-19 declaration under Section 564(b)(1) of the Act, 21 U.S.C. section 360bbb-3(b)(1), unless the authorization is terminated or revoked sooner.  Performed at [facility], [address] [place], [place] [address]   Blood culture (routine x 2)     Status: None (Preliminary result)   Collection Time: 10/28/21  3:42 PM   Specimen: BLOOD  Result Value Ref Range Status   Specimen Description BLOOD RIGHT ANTECUBITAL  Final   Special Requests   Final    BOTTLES DRAWN AEROBIC AND ANAEROBIC Blood Culture adequate volume   Culture   Final    NO GROWTH < 24 HOURS Performed at [facility], [address] [place], [place] [address]    Report Status PENDING  Incomplete  Blood culture (routine x 2)     Status: None (Preliminary result)   Collection Time: 10/28/21  3:56 PM   Specimen: BLOOD  Result Value Ref Range Status   Specimen Description BLOOD BLOOD RIGHT HAND  Final   Special Requests   Final    BOTTLES DRAWN AEROBIC AND ANAEROBIC Blood Culture adequate volume   Culture   Final    NO GROWTH < 24 HOURS Performed at [facility], [address] [place], [place] [address]    Report Status PENDING  Incomplete  Respiratory (~20 pathogens) panel by PCR     Status: None   Collection Time: 10/28/21  4:30 PM   Specimen: Urine, Clean Catch; Respiratory  Result Value Ref Range Status   Adenovirus NOT DETECTED NOT DETECTED Final   Coronavirus 229E NOT DETECTED NOT DETECTED Final    Comment: (NOTE) The Coronavirus on the Respiratory Panel, DOES NOT test for the novel  Coronavirus (2019 nCoV)    Coronavirus HKU1 NOT DETECTED NOT DETECTED Final   Coronavirus NL63 NOT DETECTED NOT DETECTED Final   Coronavirus OC43 NOT DETECTED NOT  DETECTED Final   Metapneumovirus NOT DETECTED NOT DETECTED Final   Rhinovirus / Enterovirus NOT DETECTED NOT DETECTED Final   Influenza A NOT DETECTED NOT DETECTED Final   Influenza B NOT DETECTED NOT DETECTED Final   Parainfluenza Virus 1 NOT DETECTED NOT DETECTED Final   Parainfluenza Virus 2 NOT DETECTED NOT DETECTED Final   Parainfluenza Virus 3 NOT DETECTED NOT DETECTED Final   Parainfluenza Virus 4 NOT DETECTED NOT DETECTED Final   Respiratory Syncytial Virus NOT DETECTED NOT DETECTED Final   Bordetella pertussis NOT DETECTED NOT DETECTED Final   Bordetella Parapertussis NOT DETECTED NOT DETECTED Final   Chlamydophila pneumoniae NOT DETECTED NOT DETECTED Final   Mycoplasma pneumoniae NOT DETECTED NOT DETECTED Final    Comment: Performed at [facility], [place] [address], [place], [place] [address]    Labs: CBC: Recent Labs  Lab 10/28/21 1401 10/29/21 0500  WBC 9.9 5.8  NEUTROABS 7.2  --  HGB 10.8* 7.4*  HCT 34.9* 24.7*  MCV 91.6 93.6  PLT 283 282   Basic Metabolic Panel: Recent Labs  Lab 10/28/21 1401 10/29/21 0500 10/29/21 0512  NA 138 143  --   K 3.4* 2.8*  --   CL 102 116*  --   CO2 24 21*  --   GLUCOSE 144* 98  --   BUN 17 22  --   CREATININE 2.63* 2.91*  --   CALCIUM 9.4 7.6*  --   MG  --   --  1.7   Liver Function Tests: Recent Labs  Lab 10/28/21 1401  AST 32  ALT 21  ALKPHOS 100  BILITOT 0.3  PROT 7.5  ALBUMIN 4.0   CBG: Recent Labs  Lab 10/28/21 1622 10/28/21 2220 10/29/21 0744  GLUCAP 121* 150* 122*    Discharge time spent: greater than 30 minutes.  This record has been created using Systems analyst. Errors have been sought and corrected,but may not always be located. Such creation errors do not reflect on the standard of care.   Signed: Lorella Nimrod, MD [facility] 10/29/2021

## 2021-10-29 NOTE — Consult Note (Signed)
Referring Provider: No ref. provider found Primary Care Physician:  Mylinda Latina, PA-C Primary Nephrologist:  Dr.   Luiz Iron for Consultation: ESRD  HPI: 88 year old female with history of hypertension, coronary artery disease, diabetes, peripheral vascular disease, end-stage renal disease on dialysis was sent from the dialysis unit with history of severe muscle ache, backache and dizziness.  She finished dialysis treatment yesterday.  She was found to have elevated lactic acid.  She was started on IV antibiotics.  Past Medical History:  Diagnosis Date   Arthritis    Asthma    Calf pain    Cancer ([place]) 2016   left breast   COPD (chronic obstructive pulmonary disease) (HCC)    Diabetes (HCC)    Discoid lupus    Gastro-esophageal reflux    Glaucoma    Hyperlipemia    Hypertension    Iron deficiency anemia    Joint pain    Leg swelling    Osteoarthritis    PVD (peripheral vascular disease) ([place])    Sinus problem    Sleep apnea    No CPAP/ Can't tolerate   Stroke ([place]) 1987    Past Surgical History:  Procedure Laterality Date   BREAST SURGERY Left    left lumpectomy   CAROTID ANGIOGRAPHY Left 06/25/2018   Procedure: CAROTID ANGIOGRAPHY, possible intervention;  Surgeon: Katha Cabal, MD;  Location: [facility];  Service: Cardiovascular;  Laterality: Left;   CAROTID ENDARTERECTOMY Right    CAROTID PTA/STENT INTERVENTION Left 06/25/2018   Procedure: CAROTID PTA/STENT INTERVENTION;  Surgeon: Katha Cabal, MD;  Location: [facility];  Service: Cardiovascular;  Laterality: Left;   CATARACT EXTRACTION Right 2013   CORONARY STENT PLACEMENT     L. L. E.   DIALYSIS/PERMA CATHETER INSERTION N/A 12/29/2020   Procedure: DIALYSIS/PERMA CATHETER INSERTION;  Surgeon: Algernon Huxley, MD;  Location: [facility];  Service: Cardiovascular;  Laterality: N/A;   EYE SURGERY Bilateral    cataract   LEFT HEART CATH AND CORONARY ANGIOGRAPHY Left 05/06/2018    Procedure: LEFT HEART CATH AND CORONARY ANGIOGRAPHY;  Surgeon: Corey Skains, MD;  Location: [facility];  Service: Cardiovascular;  Laterality: Left;   LOWER EXTREMITY ANGIOGRAPHY Right 01/22/2018   Procedure: LOWER EXTREMITY ANGIOGRAPHY;  Surgeon: Katha Cabal, MD;  Location: [facility];  Service: Cardiovascular;  Laterality: Right;   RENAL ANGIOGRAPHY Left 02/26/2018   Procedure: RENAL ANGIOGRAPHY;  Surgeon: Katha Cabal, MD;  Location: [facility];  Service: Cardiovascular;  Laterality: Left;   STENT PLACEMENT VASCULAR ([facility] H[facility]) Left    stent placement on LLE   TUBAL LIGATION      Prior to Admission medications   Medication Sig Start Date End Date Taking? Authorizing Provider  acetaminophen (TYLENOL) 500 MG tablet Take 1 tablet (500 mg total) by mouth every 6 (six) hours as needed for moderate pain or headache. 12/31/20  Yes Fritzi Mandes, MD  amLODipine (NORVASC) 10 MG tablet TAKE 1 TABLET EVERY DAY AT [facility] 06/14/21  Yes [name], MD  anastrozole (ARIMIDEX) 1 MG tablet TAKE 1 TABLET EVERY DAY IN THE AFTERNOON (DINNER) 06/14/21  Yes [name], MD  cholecalciferol (VITAMIN D) 25 MCG (1000 UNIT) tablet TAKE 1 TABLET EVERY MONDAY, WEDNESDAY, AND FRIDAY. 11/04/19  Yes Luiz Ochoa, NP  clopidogrel (PLAVIX) 75 MG tablet TAKE 1 TABLET DAILY AT DINNER 05/26/21  Yes McDonough, Lauren K, PA-C  docusate sodium (COLACE) 100 MG  capsule Take 200 mg by mouth at bedtime as needed for mild constipation.   Yes [provider]  donepezil (ARICEPT) 10 MG tablet TAKE 1 TABLET AT BEDTIME FOR MEMORY (NEED MD APPOINTMENT) 08/28/21  Yes McDonough, Lauren K, PA-C  hydrALAZINE (APRESOLINE) 50 MG tablet Take 100 mg by mouth in the morning and at bedtime. 12/12/20 12/12/21 Yes [provider]  insulin NPH-regular Human (70-30) 100 UNIT/ML injection Inject 12 Units into the skin 2 (two) times daily. Inject 20 units subcutaneously in the morning and 20  units in the evening. Patient taking differently: Inject 4 Units into the skin 2 (two) times daily. Inject 20 units subcutaneously in the morning and 20 units in the evening. 12/31/20  Yes Fritzi Mandes, MD  isosorbide mononitrate (IMDUR) 60 MG 24 hr tablet Take 60 mg by mouth daily. 12/13/20  Yes [provider]  ranolazine (RANEXA) 500 MG 12 hr tablet Take 500 mg by mouth 2 (two) times daily; breakfast, Afternoon (dinner) 08/16/20  Yes [name], MD  ACCU-CHEK GUIDE test strip TEST BLOOD SUGAR THREE TIMES DAILY  AS  DIRECTED 04/27/20   [name], MD  Accu-Chek Softclix Lancets lancets USE AS INSTRUCTED 3 TIMES A DAY 09/22/19   [name], MD  Alcohol Swabs (B-D SINGLE USE SWABS REGULAR) PADS Use as directed for 3 times daily DX E11.65 06/22/19   Kendell Bane, NP  Blood Glucose Monitoring Suppl (ACCU-CHEK GUIDE ME) w/Device KIT Use as instructed. DX e11.65 06/18/19   Kendell Bane, NP  fluticasone-salmeterol (ADVAIR) 100-50 MCG/ACT AEPB Inhale 1 puff into the lungs 2 (two) times daily. Patient not taking: Reported on 10/28/2021 07/19/20   [name], MD  gabapentin (NEURONTIN) 100 MG capsule Take 1 capsule (100 mg total) by mouth 3 (three) times daily. Patient not taking: Reported on 10/28/2021 12/05/20   Mylinda Latina, PA-C    Current Facility-Administered Medications  Medication Dose Route Frequency Provider Last Rate Last Admin   acetaminophen (TYLENOL) tablet 650 mg  650 mg Oral Q6H PRN Cox, Amy N, DO       Or   acetaminophen (TYLENOL) suppository 650 mg  650 mg Rectal Q6H PRN Cox, Amy N, DO       amLODipine (NORVASC) tablet 10 mg  10 mg Oral Daily Cox, Amy N, DO   10 mg at 10/29/21 0954   anastrozole (ARIMIDEX) tablet 1 mg  1 mg Oral QPC supper Cox, Amy N, DO       ceFEPIme (MAXIPIME) 2 g in sodium chloride 0.9 % 100 mL IVPB  2 g Intravenous Q24H Cox, Amy N, DO       clopidogrel (PLAVIX) tablet 75 mg  75 mg Oral QHS Cox, Amy N, DO   75 mg at 10/28/21 2223    docusate sodium (COLACE) capsule 200 mg  200 mg Oral QHS PRN Cox, Amy N, DO       donepezil (ARICEPT) tablet 10 mg  10 mg Oral QHS Cox, Amy N, DO   10 mg at 10/28/21 2223   heparin injection 5,000 Units  5,000 Units Subcutaneous Q8H Cox, Amy N, DO   5,000 Units at 10/29/21 0519   hydrALAZINE (APRESOLINE) injection 5 mg  5 mg Intravenous Q8H PRN Cox, Amy N, DO       insulin aspart (novoLOG) injection 0-5 Units  0-5 Units Subcutaneous QHS Cox, Amy N, DO       insulin aspart (novoLOG) injection 0-6 Units  0-6  Units Subcutaneous TID WC Cox, Amy N, DO       isosorbide mononitrate (IMDUR) 24 hr tablet 60 mg  60 mg Oral Daily Cox, Amy N, DO   60 mg at 10/29/21 4585   magnesium sulfate IVPB 2 g 50 mL  2 g Intravenous Once Lorella Nimrod, MD 50 mL/hr at 10/29/21 1002 2 g at 10/29/21 1002   metroNIDAZOLE (FLAGYL) IVPB 500 mg  500 mg Intravenous Q12H Cox, Amy N, DO   Stopped at 10/29/21 0509   ondansetron (ZOFRAN) tablet 4 mg  4 mg Oral Q6H PRN Cox, Amy N, DO       Or   ondansetron (ZOFRAN) injection 4 mg  4 mg Intravenous Q6H PRN Cox, Amy N, DO       ranolazine (RANEXA) 12 hr tablet 500 mg  500 mg Oral BID Cox, Amy N, DO   500 mg at 10/29/21 0954   vancomycin variable dose per unstable renal function (pharmacist dosing)   Does not apply See admin instructions Cox, Amy N, DO       Current Outpatient Medications  Medication Sig Dispense Refill   acetaminophen (TYLENOL) 500 MG tablet Take 1 tablet (500 mg total) by mouth every 6 (six) hours as needed for moderate pain or headache. 30 tablet 0   amLODipine (NORVASC) 10 MG tablet TAKE 1 TABLET EVERY DAY AT BREAKFAST 90 tablet 1   anastrozole (ARIMIDEX) 1 MG tablet TAKE 1 TABLET EVERY DAY IN THE AFTERNOON (DINNER) 90 tablet 1   cholecalciferol (VITAMIN D) 25 MCG (1000 UNIT) tablet TAKE 1 TABLET EVERY MONDAY, WEDNESDAY, AND FRIDAY. 39 tablet 3   clopidogrel (PLAVIX) 75 MG tablet TAKE 1 TABLET DAILY AT DINNER 90 tablet 1   docusate sodium (COLACE) 100 MG capsule  Take 200 mg by mouth at bedtime as needed for mild constipation.     donepezil (ARICEPT) 10 MG tablet TAKE 1 TABLET AT BEDTIME FOR MEMORY (NEED MD APPOINTMENT) 90 tablet 1   hydrALAZINE (APRESOLINE) 50 MG tablet Take 100 mg by mouth in the morning and at bedtime.     insulin NPH-regular Human (70-30) 100 UNIT/ML injection Inject 12 Units into the skin 2 (two) times daily. Inject 20 units subcutaneously in the morning and 20 units in the evening. (Patient taking differently: Inject 4 Units into the skin 2 (two) times daily. Inject 20 units subcutaneously in the morning and 20 units in the evening.) 10 mL 11   isosorbide mononitrate (IMDUR) 60 MG 24 hr tablet Take 60 mg by mouth daily.     ranolazine (RANEXA) 500 MG 12 hr tablet Take 500 mg by mouth 2 (two) times daily; breakfast, Afternoon (dinner) 180 tablet 1   ACCU-CHEK GUIDE test strip TEST BLOOD SUGAR THREE TIMES DAILY  AS  DIRECTED 300 strip 1   Accu-Chek Softclix Lancets lancets USE AS INSTRUCTED 3 TIMES A DAY 300 each 3   Alcohol Swabs (B-D SINGLE USE SWABS REGULAR) PADS Use as directed for 3 times daily DX E11.65 100 each 1   Blood Glucose Monitoring Suppl (ACCU-CHEK GUIDE ME) w/Device KIT Use as instructed. DX e11.65 1 kit 0   fluticasone-salmeterol (ADVAIR) 100-50 MCG/ACT AEPB Inhale 1 puff into the lungs 2 (two) times daily. (Patient not taking: Reported on 10/28/2021) 1 each 3   gabapentin (NEURONTIN) 100 MG capsule Take 1 capsule (100 mg total) by mouth 3 (three) times daily. (Patient not taking: Reported on 10/28/2021) 90 capsule 0    Allergies as of 10/28/2021 -  Review Complete 10/28/2021  Allergen Reaction Noted   Benazepril Nausea Only 05/28/2013    Family History  Problem Relation Age of Onset   Heart disease Mother    Diabetes Sister    Leukemia Daughter    Lung cancer Brother    Diabetes Other    Hypertension Other    Bladder Cancer Neg Hx    Kidney disease Neg Hx    Prostate cancer Neg Hx    Breast cancer Neg Hx      Social History   Socioeconomic History   Marital status: Married    Spouse name: Not on file   Number of children: Not on file   Years of education: Not on file   Highest education level: Not on file  Occupational History   Not on file  Tobacco Use   Smoking status: Former    Types: Cigarettes    Quit date: 1987    Years since quitting: 36.8   Smokeless tobacco: Never   Tobacco comments:    quit 31 years  Vaping Use   Vaping Use: Never used  Substance and Sexual Activity   Alcohol use: No   Drug use: No   Sexual activity: Not on file  Other Topics Concern   Not on file  Social History Narrative   Walks with a rolling walker; remote hx of smoking; no alcohol; [place]- with husband/son. Daughter lives in [place].    Social Determinants of Health   Financial Resource Strain: Low Risk  (06/23/2020)   Overall Financial Resource Strain (CARDIA)    Difficulty of Paying Living Expenses: Not very hard  Food Insecurity: No Food Insecurity (01/06/2021)   Hunger Vital Sign    Worried About Running Out of Food in the Last Year: Never true    Ran Out of Food in the Last Year: Never true  Transportation Needs: No Transportation Needs (01/06/2021)   PRAPARE - Hydrologist (Medical): No    Lack of Transportation (Non-Medical): No  Physical Activity: Not on file  Stress: Not on file  Social Connections: Not on file  Intimate Partner Violence: Not on file    Physical Exam: Vital signs in last 24 hours: Temp:  [98.1 F (36.7 C)-98.5 F (36.9 C)] 98.1 F (36.7 C) (10/22 1000) Pulse Rate:  [59-90] 61 (10/22 1000) Resp:  [18-22] 18 (10/22 1000) BP: (143-184)/(34-154) 176/48 (10/22 1000) SpO2:  [92 %-100 %] 100 % (10/22 1000) Weight:  [79.5 kg] 79.5 kg (10/21 1237)   General:   Alert,  Well-developed, well-nourished, pleasant and cooperative in NAD Head:  Normocephalic and atraumatic. Eyes:  Sclera clear, no icterus.   Conjunctiva  pink. Ears:  Normal auditory acuity. Nose:  No deformity, discharge,  or lesions. Lungs:  Clear throughout to auscultation.   No wheezes, crackles, or rhonchi. No acute distress. Heart:  Regular rate and rhythm; no murmurs, clicks, rubs,  or gallops. Abdomen:  Soft, nontender and nondistended. No masses, hepatosplenomegaly or hernias noted. Normal bowel sounds, without guarding, and without rebound.   Extremities:  Without clubbing or edema.  Intake/Output from previous day: No intake/output data recorded. Intake/Output this shift: No intake/output data recorded.  Lab Results: Recent Labs    10/28/21 1401 10/29/21 0500  WBC 9.9 5.8  HGB 10.8* 7.4*  HCT 34.9* 24.7*  PLT 283 206   BMET Recent Labs    10/28/21 1401 10/29/21 0500  NA 138 143  K 3.4* 2.8*  CL 102 116*  CO2 24 21*  GLUCOSE 144* 98  BUN 17 22  CREATININE 2.63* 2.91*  CALCIUM 9.4 7.6*   LFT Recent Labs    10/28/21 1401  PROT 7.5  ALBUMIN 4.0  AST 32  ALT 21  ALKPHOS 100  BILITOT 0.3   PT/INR No results for input(s): "LABPROT", "INR" in the last 72 hours. Hepatitis Panel No results for input(s): "HEPBSAG", "HCVAB", "HEPAIGM", "HEPBIGM" in the last 72 hours.  Studies/Results: MR THORACIC SPINE W WO CONTRAST  Result Date: 10/28/2021 CLINICAL DATA:  Sudden onset of mid back pain after dialysis. EXAM: MRI THORACIC WITHOUT AND WITH CONTRAST TECHNIQUE: Multiplanar and multiecho pulse sequences of the thoracic spine were obtained without and with intravenous contrast. CONTRAST:  7.31m GADAVIST GADOBUTROL 1 MMOL/ML IV SOLN COMPARISON:  None Available. FINDINGS: Alignment: No significant listhesis is present. Normal thoracic kyphosis is present. Vertebrae: Mixed edematous and fatty endplate changes are present at T5-6 and to a lesser extent at T6-7. Chronic fatty endplate marrow changes are noted anteriorly at T11-12. Marrow signal and vertebral body heights are otherwise normal. Cord:  Normal signal and  morphology. Paraspinal and other soft tissues: Visualized lung bases are clear. Visualized mediastinum is unremarkable. Limited imaging the abdomen is unremarkable. There is no significant adenopathy. No solid organ lesions are present. Disc levels: No significant central disc protrusion or stenosis is present. Foramina are patent bilaterally. IMPRESSION: 1. No acute or focal lesion to explain the patient's symptoms. 2. Mixed edematous and fatty endplate changes at TH8-2and to a lesser extent at T6-7. Findings are most consistent with focal endplate degenerative change. 3. Chronic fatty endplate marrow changes anteriorly at T11-12. 4. No significant central disc protrusion or stenosis. Electronically Signed   By: CSan MorelleM.D.   On: 10/28/2021 19:33   CT CHEST ABDOMEN PELVIS WO CONTRAST  Result Date: 10/28/2021 CLINICAL DATA:  Sepsis EXAM: CT CHEST, ABDOMEN AND PELVIS WITHOUT CONTRAST TECHNIQUE: Multidetector CT imaging of the chest, abdomen and pelvis was performed following the standard protocol without IV contrast. RADIATION DOSE REDUCTION: This exam was performed according to the departmental dose-optimization program which includes automated exposure control, adjustment of the mA and/or kV according to patient size and/or use of iterative reconstruction technique. COMPARISON:  CT abdomen and pelvis done on 09/19/2006 FINDINGS: CT CHEST FINDINGS Cardiovascular: Coronary artery calcifications are seen. There are coarse calcifications in thoracic aorta and its major branches. Right IJ dialysis catheter is seen with its tip at the junction of superior vena cava and right atrium. Mediastinum/Nodes: No significant lymphadenopathy seen. There is slightly inhomogeneous attenuation in thyroid. Lungs/Pleura: There is no focal pulmonary consolidation. There is no pleural effusion or pneumothorax. Musculoskeletal: Degenerative changes are noted in thoracic spine, more severe at T5-T6 level. There is  endplate sclerosis without any break in the cortical margins. CT ABDOMEN PELVIS FINDINGS Hepatobiliary: No focal abnormalities are seen in liver. Gallbladder is unremarkable. There is no dilation of bile ducts. Pancreas: No focal abnormalities are seen. Spleen: Unremarkable. Adrenals/Urinary Tract: There is hyperplasia of left adrenal with no significant interval change. There is no hydronephrosis. There are multiple calcifications in renal artery branches. No definite renal or ureteral stones are seen. There are few small smooth marginated low-density lesions suggesting renal cysts largest measuring 1.9 cm in size in the midportion of right kidney. Urinary bladder is unremarkable. Stomach/Bowel: Stomach is unremarkable. Small bowel loops are not dilated. Appendix is not dilated. Multiple diverticula are seen in colon without signs of focal acute diverticulitis. Vascular/Lymphatic:  Extensive arterial calcifications are seen. There is vascular stent in proximal left renal artery. Vascular stents are seen in common iliac arteries on both sides. Reproductive: Unremarkable. Other: There is no ascites or pneumoperitoneum. Umbilical hernia containing fat is seen. Musculoskeletal: Degenerative changes are noted in thoracic and lumbar spine, more severe at T5-T6 level. There is encroachment of neural foramina at multiple levels in lumbar spine. IMPRESSION: No acute findings are seen in noncontrast CT chest, abdomen and pelvis. Degenerative changes are noted in thoracic and lumbar spine. There is significant endplate sclerosis adjacent to T5-T6 disc space. There is no break in the cortical margins. This finding is most likely due to severe disc degeneration. Less likely possibility would be infectious process. Please correlate with clinical and laboratory findings. Severe arteriosclerosis with multiple vascular stents. Coronary artery calcifications are seen. Diverticulosis of colon without signs of diverticulitis. Renal  cysts. Other findings as described in the body of the report. Electronically Signed   By: Elmer Picker M.D.   On: 10/28/2021 15:36   CT HEAD WO CONTRAST (5MM)  Result Date: 10/28/2021 CLINICAL DATA:  Headache EXAM: CT HEAD WITHOUT CONTRAST TECHNIQUE: Contiguous axial images were obtained from the base of the skull through the vertex without intravenous contrast. RADIATION DOSE REDUCTION: This exam was performed according to the departmental dose-optimization program which includes automated exposure control, adjustment of the mA and/or kV according to patient size and/or use of iterative reconstruction technique. COMPARISON:  CT head dated 06/25/2018 FINDINGS: Brain: No evidence of acute infarction, hemorrhage, hydrocephalus, extra-axial collection or mass lesion/mass effect. Slight enlargement of the lateral ventricles in keeping with age-related diffuse parenchymal atrophy with sequela of chronic microvascular ischemic changes. Similar left thalamic lacunar infarct. Vascular: No hyperdense vessel or unexpected calcification. Skull: No fracture or focal lesion. Sinuses/Orbits: Bilateral pseudophakia. Trace mucosal thickening of the right maxillary sinus. Other: None. IMPRESSION: No intracranial hemorrhage, large territory infarction, or mass effect. Electronically Signed   By: Darrin Nipper M.D.   On: 10/28/2021 14:59   DG Chest 2 View  Result Date: 10/28/2021 CLINICAL DATA:  Shortness of breath. Chest and back pain. On dialysis. EXAM: CHEST - 2 VIEW COMPARISON:  12/18/2020 FINDINGS: Right jugular dual-lumen central venous catheter is seen in appropriate position. The heart size and mediastinal contours are within normal limits. Aortic atherosclerotic calcification incidentally noted. Both lungs are clear. Surgical clips again seen in the left lower anterior chest wall. The visualized skeletal structures are unremarkable. IMPRESSION: No active cardiopulmonary disease. Electronically Signed   By: Marlaine Hind M.D.   On: 10/28/2021 13:34    Assessment/Plan:  88 year old female with history of hypertension, coronary artery disease, diabetes, peripheral vascular disease, end-stage renal disease on dialysis was sent from the dialysis unit with history of severe muscle ache, backache and dizziness.  She finished dialysis treatment yesterday.  She was found to have elevated lactic acid.  She was started on IV antibiotics.   ESRD: She is on a Tuesday Thursday Saturday schedule.  We will reevaluate patient for dialysis needs tomorrow.  ANEMIA: We will monitor hemoglobin closely and continue anemia protocol with dialysis.  MBD: We will monitor PTH, calcium and phosphorus levels.  HTN/VOL: Blood pressure is well controlled.  Sepsis: Patient is on cefepime, vancomycin and metronidazole.  We will monitor closely.   LOS: 0 Lyla Son, MD [facility] _0 _1 :29 AM

## 2021-10-31 DIAGNOSIS — N2581 Secondary hyperparathyroidism of renal origin: Secondary | ICD-10-CM | POA: Diagnosis not present

## 2021-10-31 DIAGNOSIS — Z992 Dependence on renal dialysis: Secondary | ICD-10-CM | POA: Diagnosis not present

## 2021-10-31 DIAGNOSIS — N186 End stage renal disease: Secondary | ICD-10-CM | POA: Diagnosis not present

## 2021-11-02 ENCOUNTER — Inpatient Hospital Stay: Payer: Medicare HMO | Admitting: Physician Assistant

## 2021-11-02 DIAGNOSIS — Z992 Dependence on renal dialysis: Secondary | ICD-10-CM | POA: Diagnosis not present

## 2021-11-02 DIAGNOSIS — N2581 Secondary hyperparathyroidism of renal origin: Secondary | ICD-10-CM | POA: Diagnosis not present

## 2021-11-02 DIAGNOSIS — N186 End stage renal disease: Secondary | ICD-10-CM | POA: Diagnosis not present

## 2021-11-02 LAB — CULTURE, BLOOD (ROUTINE X 2)
Culture: NO GROWTH
Culture: NO GROWTH
Special Requests: ADEQUATE
Special Requests: ADEQUATE

## 2021-11-04 DIAGNOSIS — N186 End stage renal disease: Secondary | ICD-10-CM | POA: Diagnosis not present

## 2021-11-04 DIAGNOSIS — Z992 Dependence on renal dialysis: Secondary | ICD-10-CM | POA: Diagnosis not present

## 2021-11-04 DIAGNOSIS — N2581 Secondary hyperparathyroidism of renal origin: Secondary | ICD-10-CM | POA: Diagnosis not present

## 2021-11-06 ENCOUNTER — Encounter (INDEPENDENT_AMBULATORY_CARE_PROVIDER_SITE_OTHER): Payer: Self-pay

## 2021-11-07 ENCOUNTER — Ambulatory Visit: Payer: Self-pay

## 2021-11-07 DIAGNOSIS — Z992 Dependence on renal dialysis: Secondary | ICD-10-CM | POA: Diagnosis not present

## 2021-11-07 DIAGNOSIS — N2581 Secondary hyperparathyroidism of renal origin: Secondary | ICD-10-CM | POA: Diagnosis not present

## 2021-11-07 DIAGNOSIS — N186 End stage renal disease: Secondary | ICD-10-CM | POA: Diagnosis not present

## 2021-11-07 NOTE — Patient Outreach (Signed)
  Care Coordination   11/07/2021 Name: Jennifer Carrillo MRN: [id number] DOB: [birth year]   Care Coordination Outreach Attempts:  An unsuccessful telephone outreach was attempted for a scheduled appointment today.  Follow Up Plan:  Additional outreach attempts will be made to offer the patient care coordination information and services.   Encounter Outcome:  Contact request return call.  Care Coordination Interventions Activated:  No   Care Coordination Interventions:  No, not indicated    Quinn Plowman [facility] [phone] direct line

## 2021-11-09 DIAGNOSIS — N2581 Secondary hyperparathyroidism of renal origin: Secondary | ICD-10-CM | POA: Diagnosis not present

## 2021-11-09 DIAGNOSIS — N186 End stage renal disease: Secondary | ICD-10-CM | POA: Diagnosis not present

## 2021-11-09 DIAGNOSIS — Z992 Dependence on renal dialysis: Secondary | ICD-10-CM | POA: Diagnosis not present

## 2021-11-10 DIAGNOSIS — M25561 Pain in right knee: Secondary | ICD-10-CM | POA: Diagnosis not present

## 2021-11-10 DIAGNOSIS — M170 Bilateral primary osteoarthritis of knee: Secondary | ICD-10-CM | POA: Diagnosis not present

## 2021-11-10 DIAGNOSIS — M25562 Pain in left knee: Secondary | ICD-10-CM | POA: Diagnosis not present

## 2021-11-10 DIAGNOSIS — G8929 Other chronic pain: Secondary | ICD-10-CM | POA: Diagnosis not present

## 2021-11-11 DIAGNOSIS — N2581 Secondary hyperparathyroidism of renal origin: Secondary | ICD-10-CM | POA: Diagnosis not present

## 2021-11-11 DIAGNOSIS — N186 End stage renal disease: Secondary | ICD-10-CM | POA: Diagnosis not present

## 2021-11-11 DIAGNOSIS — Z992 Dependence on renal dialysis: Secondary | ICD-10-CM | POA: Diagnosis not present

## 2021-11-14 DIAGNOSIS — N186 End stage renal disease: Secondary | ICD-10-CM | POA: Diagnosis not present

## 2021-11-14 DIAGNOSIS — N2581 Secondary hyperparathyroidism of renal origin: Secondary | ICD-10-CM | POA: Diagnosis not present

## 2021-11-14 DIAGNOSIS — Z992 Dependence on renal dialysis: Secondary | ICD-10-CM | POA: Diagnosis not present

## 2021-11-16 DIAGNOSIS — N2581 Secondary hyperparathyroidism of renal origin: Secondary | ICD-10-CM | POA: Diagnosis not present

## 2021-11-16 DIAGNOSIS — N186 End stage renal disease: Secondary | ICD-10-CM | POA: Diagnosis not present

## 2021-11-16 DIAGNOSIS — Z992 Dependence on renal dialysis: Secondary | ICD-10-CM | POA: Diagnosis not present

## 2021-11-17 DIAGNOSIS — G8929 Other chronic pain: Secondary | ICD-10-CM | POA: Diagnosis not present

## 2021-11-17 DIAGNOSIS — M25562 Pain in left knee: Secondary | ICD-10-CM | POA: Diagnosis not present

## 2021-11-17 DIAGNOSIS — M25561 Pain in right knee: Secondary | ICD-10-CM | POA: Diagnosis not present

## 2021-11-17 DIAGNOSIS — M170 Bilateral primary osteoarthritis of knee: Secondary | ICD-10-CM | POA: Diagnosis not present

## 2021-11-18 DIAGNOSIS — Z992 Dependence on renal dialysis: Secondary | ICD-10-CM | POA: Diagnosis not present

## 2021-11-18 DIAGNOSIS — N186 End stage renal disease: Secondary | ICD-10-CM | POA: Diagnosis not present

## 2021-11-18 DIAGNOSIS — N2581 Secondary hyperparathyroidism of renal origin: Secondary | ICD-10-CM | POA: Diagnosis not present

## 2021-11-21 DIAGNOSIS — N186 End stage renal disease: Secondary | ICD-10-CM | POA: Diagnosis not present

## 2021-11-21 DIAGNOSIS — Z992 Dependence on renal dialysis: Secondary | ICD-10-CM | POA: Diagnosis not present

## 2021-11-21 DIAGNOSIS — N2581 Secondary hyperparathyroidism of renal origin: Secondary | ICD-10-CM | POA: Diagnosis not present

## 2021-11-23 DIAGNOSIS — N2581 Secondary hyperparathyroidism of renal origin: Secondary | ICD-10-CM | POA: Diagnosis not present

## 2021-11-23 DIAGNOSIS — Z992 Dependence on renal dialysis: Secondary | ICD-10-CM | POA: Diagnosis not present

## 2021-11-23 DIAGNOSIS — N186 End stage renal disease: Secondary | ICD-10-CM | POA: Diagnosis not present

## 2021-11-24 DIAGNOSIS — G8929 Other chronic pain: Secondary | ICD-10-CM | POA: Diagnosis not present

## 2021-11-24 DIAGNOSIS — M170 Bilateral primary osteoarthritis of knee: Secondary | ICD-10-CM | POA: Diagnosis not present

## 2021-11-25 DIAGNOSIS — Z992 Dependence on renal dialysis: Secondary | ICD-10-CM | POA: Diagnosis not present

## 2021-11-25 DIAGNOSIS — N186 End stage renal disease: Secondary | ICD-10-CM | POA: Diagnosis not present

## 2021-11-25 DIAGNOSIS — N2581 Secondary hyperparathyroidism of renal origin: Secondary | ICD-10-CM | POA: Diagnosis not present

## 2021-11-27 ENCOUNTER — Ambulatory Visit: Payer: Medicare HMO | Admitting: Physician Assistant

## 2021-11-27 DIAGNOSIS — N186 End stage renal disease: Secondary | ICD-10-CM | POA: Diagnosis not present

## 2021-11-27 DIAGNOSIS — N2581 Secondary hyperparathyroidism of renal origin: Secondary | ICD-10-CM | POA: Diagnosis not present

## 2021-11-27 DIAGNOSIS — Z992 Dependence on renal dialysis: Secondary | ICD-10-CM | POA: Diagnosis not present

## 2021-11-29 DIAGNOSIS — N186 End stage renal disease: Secondary | ICD-10-CM | POA: Diagnosis not present

## 2021-11-29 DIAGNOSIS — N2581 Secondary hyperparathyroidism of renal origin: Secondary | ICD-10-CM | POA: Diagnosis not present

## 2021-11-29 DIAGNOSIS — Z992 Dependence on renal dialysis: Secondary | ICD-10-CM | POA: Diagnosis not present

## 2021-12-02 DIAGNOSIS — Z992 Dependence on renal dialysis: Secondary | ICD-10-CM | POA: Diagnosis not present

## 2021-12-02 DIAGNOSIS — N186 End stage renal disease: Secondary | ICD-10-CM | POA: Diagnosis not present

## 2021-12-02 DIAGNOSIS — N2581 Secondary hyperparathyroidism of renal origin: Secondary | ICD-10-CM | POA: Diagnosis not present

## 2021-12-05 DIAGNOSIS — Z992 Dependence on renal dialysis: Secondary | ICD-10-CM | POA: Diagnosis not present

## 2021-12-05 DIAGNOSIS — N2581 Secondary hyperparathyroidism of renal origin: Secondary | ICD-10-CM | POA: Diagnosis not present

## 2021-12-05 DIAGNOSIS — N186 End stage renal disease: Secondary | ICD-10-CM | POA: Diagnosis not present

## 2021-12-07 DIAGNOSIS — Z992 Dependence on renal dialysis: Secondary | ICD-10-CM | POA: Diagnosis not present

## 2021-12-07 DIAGNOSIS — N2581 Secondary hyperparathyroidism of renal origin: Secondary | ICD-10-CM | POA: Diagnosis not present

## 2021-12-07 DIAGNOSIS — N186 End stage renal disease: Secondary | ICD-10-CM | POA: Diagnosis not present

## 2021-12-09 DIAGNOSIS — Z992 Dependence on renal dialysis: Secondary | ICD-10-CM | POA: Diagnosis not present

## 2021-12-09 DIAGNOSIS — N2581 Secondary hyperparathyroidism of renal origin: Secondary | ICD-10-CM | POA: Diagnosis not present

## 2021-12-09 DIAGNOSIS — N186 End stage renal disease: Secondary | ICD-10-CM | POA: Diagnosis not present

## 2021-12-12 DIAGNOSIS — Z992 Dependence on renal dialysis: Secondary | ICD-10-CM | POA: Diagnosis not present

## 2021-12-12 DIAGNOSIS — N2581 Secondary hyperparathyroidism of renal origin: Secondary | ICD-10-CM | POA: Diagnosis not present

## 2021-12-12 DIAGNOSIS — N186 End stage renal disease: Secondary | ICD-10-CM | POA: Diagnosis not present

## 2021-12-14 ENCOUNTER — Encounter: Payer: Self-pay | Admitting: Emergency Medicine

## 2021-12-14 ENCOUNTER — Other Ambulatory Visit: Payer: Self-pay

## 2021-12-14 ENCOUNTER — Ambulatory Visit
Admission: EM | Admit: 2021-12-14 | Discharge: 2021-12-14 | Disposition: A | Discharge status: Home / Self Care | Payer: Medicare HMO | Attending: Emergency Medicine | Admitting: Emergency Medicine

## 2021-12-14 ENCOUNTER — Encounter
Admission: EM | Disposition: A | Discharge status: Home / Self Care | Payer: Self-pay | Source: Home / Self Care | Attending: Emergency Medicine

## 2021-12-14 DIAGNOSIS — E1122 Type 2 diabetes mellitus with diabetic chronic kidney disease: Secondary | ICD-10-CM | POA: Diagnosis not present

## 2021-12-14 DIAGNOSIS — Z992 Dependence on renal dialysis: Secondary | ICD-10-CM | POA: Insufficient documentation

## 2021-12-14 DIAGNOSIS — T8249XA Other complication of vascular dialysis catheter, initial encounter: Secondary | ICD-10-CM | POA: Diagnosis not present

## 2021-12-14 DIAGNOSIS — I120 Hypertensive chronic kidney disease with stage 5 chronic kidney disease or end stage renal disease: Secondary | ICD-10-CM | POA: Insufficient documentation

## 2021-12-14 DIAGNOSIS — T829XXA Unspecified complication of cardiac and vascular prosthetic device, implant and graft, initial encounter: Secondary | ICD-10-CM

## 2021-12-14 DIAGNOSIS — I2510 Atherosclerotic heart disease of native coronary artery without angina pectoris: Secondary | ICD-10-CM | POA: Diagnosis not present

## 2021-12-14 DIAGNOSIS — T8241XA Breakdown (mechanical) of vascular dialysis catheter, initial encounter: Secondary | ICD-10-CM | POA: Insufficient documentation

## 2021-12-14 DIAGNOSIS — Z87891 Personal history of nicotine dependence: Secondary | ICD-10-CM

## 2021-12-14 DIAGNOSIS — Y841 Kidney dialysis as the cause of abnormal reaction of the patient, or of later complication, without mention of misadventure at the time of the procedure: Secondary | ICD-10-CM | POA: Insufficient documentation

## 2021-12-14 DIAGNOSIS — N186 End stage renal disease: Secondary | ICD-10-CM | POA: Insufficient documentation

## 2021-12-14 DIAGNOSIS — N2581 Secondary hyperparathyroidism of renal origin: Secondary | ICD-10-CM | POA: Diagnosis not present

## 2021-12-14 HISTORY — PX: DIALYSIS/PERMA CATHETER INSERTION: CATH118288

## 2021-12-14 LAB — GLUCOSE, CAPILLARY: Glucose-Capillary: 71 mg/dL (ref 70–99)

## 2021-12-14 SURGERY — DIALYSIS/PERMA CATHETER INSERTION
Anesthesia: Moderate Sedation

## 2021-12-14 MED ORDER — ONDANSETRON HCL 4 MG/2ML IJ SOLN: 4.0000 mg | Freq: Four times a day (QID) | INTRAMUSCULAR | Status: DC | PRN

## 2021-12-14 MED ORDER — MIDAZOLAM HCL 2 MG/2ML IJ SOLN
INTRAMUSCULAR | Status: DC | PRN
  Administered 2021-12-14 (×2): 1 mg via INTRAVENOUS

## 2021-12-14 MED ORDER — SODIUM CHLORIDE 0.9 % IV SOLN: INTRAVENOUS | Status: DC

## 2021-12-14 MED ORDER — CEFAZOLIN SODIUM-DEXTROSE 1-4 GM/50ML-% IV SOLN
INTRAVENOUS | Status: AC
  Administered 2021-12-14: 1 g via INTRAVENOUS
  Filled 2021-12-14: qty 50

## 2021-12-14 MED ORDER — CEFAZOLIN SODIUM-DEXTROSE 1-4 GM/50ML-% IV SOLN: 1.0000 g | INTRAVENOUS | Status: AC

## 2021-12-14 MED ORDER — MIDAZOLAM HCL 2 MG/ML PO SYRP: 8.0000 mg | ORAL_SOLUTION | Freq: Once | ORAL | Status: DC | PRN

## 2021-12-14 MED ORDER — FAMOTIDINE 20 MG PO TABS: 40.0000 mg | ORAL_TABLET | Freq: Once | ORAL | Status: DC | PRN

## 2021-12-14 MED ORDER — DIPHENHYDRAMINE HCL 50 MG/ML IJ SOLN: 50.0000 mg | Freq: Once | INTRAMUSCULAR | Status: DC | PRN

## 2021-12-14 MED ORDER — HYDROMORPHONE HCL 1 MG/ML IJ SOLN: 1.0000 mg | Freq: Once | INTRAMUSCULAR | Status: DC | PRN

## 2021-12-14 MED ORDER — FENTANYL CITRATE PF 50 MCG/ML IJ SOSY
PREFILLED_SYRINGE | INTRAMUSCULAR | Status: AC
  Filled 2021-12-14: qty 1

## 2021-12-14 MED ORDER — METHYLPREDNISOLONE SODIUM SUCC 125 MG IJ SOLR: 125.0000 mg | Freq: Once | INTRAMUSCULAR | Status: DC | PRN

## 2021-12-14 MED ORDER — MIDAZOLAM HCL 2 MG/2ML IJ SOLN
INTRAMUSCULAR | Status: AC
  Filled 2021-12-14: qty 2

## 2021-12-14 MED ORDER — FENTANYL CITRATE (PF) 100 MCG/2ML IJ SOLN
INTRAMUSCULAR | Status: DC | PRN
  Administered 2021-12-14 (×2): 25 ug via INTRAVENOUS

## 2021-12-14 SURGICAL SUPPLY — 6 items
BIOPATCH RED 1 DISK 7.0 (GAUZE/BANDAGES/DRESSINGS) IMPLANT
CATH PALIN MAXID VT KIT 19CM (CATHETERS) IMPLANT
GUIDEWIRE SUPER STIFF .035X180 (WIRE) IMPLANT
PACK ANGIOGRAPHY (CUSTOM PROCEDURE TRAY) IMPLANT
SUT MNCRL AB 4-0 PS2 18 (SUTURE) IMPLANT
SUT SILK 0 FSL (SUTURE) IMPLANT

## 2021-12-14 NOTE — Consult Note (Signed)
MRN : [id number]   Jennifer Carrillo is a 88 y.o. (01/28/1933) female who presents with chief complaint of check access.   History of Present Illness:    I am asked to see the patient by Dr. Cheri Fowler.   Patient is an 88 year old woman who is chronically maintained on hemodialysis.  She was sent to the emergency room today after the mall and of the arterial line was broken off and the hub of her tunneled dialysis catheter.  This happened at the end of her dialysis so she did receive a complete treatment.   Patient denies any shortness of breath she has not missed any other dialysis sessions.  She denies fever chills.  She denies tenderness at the catheter site or drainage from her catheter site     Active Medications      Current Meds  Medication Sig   amLODipine (NORVASC) 10 MG tablet TAKE 1 TABLET EVERY DAY AT BREAKFAST   anastrozole (ARIMIDEX) 1 MG tablet TAKE 1 TABLET EVERY DAY IN THE AFTERNOON (DINNER)   cholecalciferol (VITAMIN D) 25 MCG (1000 UNIT) tablet TAKE 1 TABLET EVERY MONDAY, WEDNESDAY, AND FRIDAY.   clopidogrel (PLAVIX) 75 MG tablet TAKE 1 TABLET DAILY AT DINNER   donepezil (ARICEPT) 10 MG tablet TAKE 1 TABLET AT BEDTIME FOR MEMORY (NEED MD APPOINTMENT)   isosorbide mononitrate (IMDUR) 60 MG 24 hr tablet Take 60 mg by mouth daily.   ranolazine (RANEXA) 500 MG 12 hr tablet Take 500 mg by mouth 2 (two) times daily; breakfast, Afternoon (dinner)            Past Medical History:  Diagnosis Date   Arthritis     Asthma     Calf pain     Cancer ([place]) 2016    left breast   COPD (chronic obstructive pulmonary disease) (HCC)     Diabetes (HCC)     Discoid lupus     Gastro-esophageal reflux     Glaucoma     Hyperlipemia     Hypertension     Iron deficiency anemia      Joint pain     Leg swelling     Osteoarthritis     PVD (peripheral vascular disease) ([place])     Sinus problem     Sleep apnea  No CPAP/ Can't tolerate   Stroke ([place]) 1987           Past Surgical History:  Procedure Laterality Date   BREAST SURGERY Left      left lumpectomy   CAROTID ANGIOGRAPHY Left 06/25/2018    Procedure: CAROTID ANGIOGRAPHY, possible intervention;  Surgeon: Katha Cabal, MD;  Location: [place] [facility];  Service: Cardiovascular;  Laterality: Left;   CAROTID ENDARTERECTOMY Right     CAROTID PTA/STENT INTERVENTION Left 06/25/2018    Procedure: CAROTID PTA/STENT INTERVENTION;  Surgeon: Katha Cabal, MD;  Location: [facility];  Service: Cardiovascular;  Laterality: Left;   CATARACT EXTRACTION Right 2013   CORONARY STENT PLACEMENT        L. L. E.   DIALYSIS/PERMA CATHETER INSERTION N/A 12/29/2020    Procedure: DIALYSIS/PERMA CATHETER INSERTION;  Surgeon: Algernon Huxley, MD;  Location: [facility];  Service: Cardiovascular;  Laterality: N/A;   EYE SURGERY Bilateral      cataract   LEFT HEART CATH AND CORONARY ANGIOGRAPHY Left 05/06/2018    Procedure: LEFT HEART CATH AND CORONARY ANGIOGRAPHY;  Surgeon: Corey Skains, MD;  Location: [place] [facility];  Service: Cardiovascular;  Laterality: Left;   LOWER EXTREMITY ANGIOGRAPHY Right 01/22/2018    Procedure: LOWER EXTREMITY ANGIOGRAPHY;  Surgeon: Katha Cabal, MD;  Location: [facility] [facility];  Service: Cardiovascular;  Laterality: Right;   RENAL ANGIOGRAPHY Left 02/26/2018    Procedure: RENAL ANGIOGRAPHY;  Surgeon: Katha Cabal, MD;  Location: [facility];  Service: Cardiovascular;  Laterality: Left;   STENT PLACEMENT VASCULAR ([facility] HX) Left      stent placement on LLE   TUBAL LIGATION          Social History Social History         Tobacco Use   Smoking status: Former      Types: Cigarettes      Quit date: 1987      Years since quitting: 36.9    Smokeless tobacco: Never   Tobacco comments:      quit 31 years  Vaping Use   Vaping Use: Never used  Substance Use Topics   Alcohol use: No   Drug use: No      Family History      Family History  Problem Relation Age of Onset   Heart disease Mother     Diabetes Sister     Leukemia Daughter     Lung cancer Brother     Diabetes Other     Hypertension Other     Bladder Cancer Neg Hx     Kidney disease Neg Hx     Prostate cancer Neg Hx     Breast cancer Neg Hx            Allergies  Allergen Reactions   Benazepril Nausea Only        REVIEW OF SYSTEMS (Negative unless checked)   Constitutional: '[]'$ Weight loss  '[]'$ Fever  '[]'$ Chills Cardiac: '[]'$ Chest pain   '[]'$ Chest pressure   '[]'$ Palpitations   '[]'$ Shortness of breath when laying flat   '[]'$ Shortness of breath with exertion. Vascular:  '[]'$ Pain in legs with walking   '[]'$ Pain in legs at rest  '[]'$ History of DVT   '[]'$ Phlebitis   '[]'$ Swelling in legs   '[]'$ Varicose veins   '[]'$ Non-healing ulcers Pulmonary:   '[]'$ Uses home oxygen   '[]'$ Productive cough   '[]'$ Hemoptysis   '[]'$ Wheeze  '[]'$ COPD   '[]'$ Asthma Neurologic:  '[]'$ Dizziness   '[]'$   Seizures   '[]'$ History of stroke   '[]'$ History of TIA  '[]'$ Aphasia   '[]'$ Vissual changes   '[]'$ Weakness or numbness in arm   '[]'$ Weakness or numbness in leg Musculoskeletal:   '[]'$ Joint swelling   '[]'$ Joint pain   '[]'$ Low back pain Hematologic:  '[]'$ Easy bruising  '[]'$ Easy bleeding   '[]'$ Hypercoagulable state   '[]'$ Anemic Gastrointestinal:  '[]'$ Diarrhea   '[]'$ Vomiting  '[]'$ Gastroesophageal reflux/heartburn   '[]'$ Difficulty swallowing. Genitourinary:  '[x]'$ Chronic kidney disease   '[]'$ Difficult urination  '[]'$ Frequent urination   '[]'$ Blood in urine Skin:  '[]'$ Rashes   '[]'$ Ulcers  Psychological:  '[]'$ History of anxiety   '[]'$  History of major depression.   Physical Examination        Vitals:    12/14/21 1237 12/14/21 1446 12/14/21 1744  BP: (!) 148/50 (!) 177/55    Pulse: 67 61    Resp: 19 (!) 21    Temp: 98.1 F (36.7 C) 97.8 F (36.6 C)    TempSrc: Oral Oral    SpO2: 100%  100% 100%  Weight: 79.5 kg 74.8 kg    Height: '5\' 7"'$  (1.702 m) '5\' 5"'$  (1.651 m)      Body mass index is 27.46 kg/m. Gen: WD/WN, NAD Head: [place]/AT, No temporalis wasting.  Ear/Nose/Throat: Hearing grossly intact, nares w/o erythema or drainage Eyes: PER, EOMI, sclera nonicteric.  Neck: Supple, no gross masses or lesions.  No JVD.  Pulmonary:  Good air movement, no audible wheezing, no use of accessory muscles.  Cardiac: RRR, precordium non-hyperdynamic. Vascular:   Nonfunctioning dialysis catheter right IJ no evidence for cellulitis or infection surrounding the catheter at the exit site Vessel Right Left  Radial Palpable Palpable  Brachial Palpable Palpable  Gastrointestinal: soft, non-distended. No guarding/no peritoneal signs.  Musculoskeletal: M/S 5/5 throughout.  No deformity.  Neurologic: CN 2-12 intact. Pain and light touch intact in extremities.  Symmetrical.  Speech is fluent. Motor exam as listed above. Psychiatric: Judgment intact, Mood & affect appropriate for pt's clinical situation. Dermatologic: No rashes or ulcers noted.  No changes consistent with cellulitis.     CBC Recent Labs       Lab Results  Component Value Date    WBC 5.8 10/29/2021    HGB 7.4 (L) 10/29/2021    HCT 24.7 (L) 10/29/2021    MCV 93.6 10/29/2021    PLT 206 10/29/2021        BMET Labs (Brief)          Component Value Date/Time    NA 143 10/29/2021 0500    NA 142 08/12/2020 1032    NA 142 05/20/2013 0509    K 2.8 (L) 10/29/2021 0500    K 3.7 05/20/2013 0509    CL 116 (H) 10/29/2021 0500    CL 108 (H) 05/20/2013 0509    CO2 21 (L) 10/29/2021 0500    CO2 30 05/20/2013 0509    GLUCOSE 98 10/29/2021 0500    GLUCOSE 108 (H) 05/20/2013 0509    BUN 22 10/29/2021 0500    BUN 37 (H) 08/12/2020 1032    BUN 13 05/20/2013 0509    CREATININE 2.91 (H) 10/29/2021 0500    CREATININE 0.92 05/20/2013 0509    CALCIUM 7.6 (L) 10/29/2021 0500    CALCIUM 8.9 05/20/2013 0509    GFRNONAA 15 (L)  10/29/2021 0500    GFRNONAA >60 05/20/2013 0509    GFRAA 31 (L) 02/13/2019 1045    GFRAA >60 05/20/2013 0509      CrCl cannot be calculated (Patient's most recent  lab result is older than the maximum 21 days allowed.).   COAG Recent Labs       Lab Results  Component Value Date    INR 1.3 (H) 12/19/2020        Radiology  Imaging Results  PERIPHERAL VASCULAR CATHETERIZATION   Result Date: 12/14/2021 See surgical note for result.        Assessment/Plan  End-stage renal disease: The patient's tunneled dialysis catheter is not repairable.  She will require exchange over a wire.  Risk and benefits been reviewed all questions answered patient agrees to proceed.  We will plan to do this exchange as soon as possible so that she may go home and continue dialysis as scheduled as an outpatient Coronary artery disease: Continue cardiac and antihypertensive medications as already ordered and reviewed, no changes at this time.   Continue statin as ordered and reviewed, no changes at this time   Nitrates PRN for chest pain Diabetes mellitus: Continue hypoglycemic medications as already ordered, these medications have been reviewed and there are no changes at this time.   Hgb A1C to be monitored as already arranged by primary service Hypertension: Continue antihypertensive medications as already ordered, these medications have been reviewed and there are no changes at this time.     Hortencia Pilar, MD   12/14/2021 6:11 PM

## 2021-12-14 NOTE — ED Notes (Signed)
First Nurse Note: Pt to ED via POV. Pts dialysis fistula is not working and pt was advised to come to the ED. Pt is in NAD.

## 2021-12-14 NOTE — ED Provider Triage Note (Signed)
Emergency Medicine Provider Triage Evaluation Note  MYKELL GENAO, a 88 y.o. female  was evaluated in triage.  Pt complains of broken Port-A-Cath.  She presents to the ED from dialysis, after her Port-A-Cath broke following dialysis today.  She denies any other complaints at this time.  Review of Systems  Positive: Port-a-cath dysfunction  Negative: FCS  Physical Exam  BP (!) 148/50   Pulse 67   Temp 98.1 F (36.7 C) (Oral)   Resp 19   Ht '5\' 7"'$  (1.702 m)   Wt 79.5 kg   SpO2 100%   BMI 27.45 kg/m  Gen:   Awake, no distress  NAD Resp:  Normal effort CTA MSK:   Moves extremities without difficulty  Other:    Medical Decision Making  Medically screening exam initiated at 12:52 PM.  Appropriate orders placed.  [name] was informed that the remainder of the evaluation will be completed by another provider, this initial triage assessment does not replace that evaluation, and the importance of remaining in the ED until their evaluation is complete.  Geriatric patient to the ED for evaluation of Port-A-Cath malfunction.   Melvenia Needles, PA-C 12/14/21 1253

## 2021-12-14 NOTE — ED Provider Notes (Signed)
[facility] Provider Note   Event Date/Time   First MD Initiated Contact with Patient 12/14/21 1409     (approximate) History  Vascular Access Problem (The lure lock end of patient's arterial lumen from her port-a-cath broke when dialysis nurse was de-accessing after she completed dialysis)  HPI Jennifer Carrillo is a 88 y.o. female with a past medical history of end-stage renal disease on dialysis Tuesday/Thursday/Saturday via right chest Port-A-Cath who presents after a portion of of the Port-A-Cath lumen broke during dialysis when it was being deaccessed and patient was told that she would need to present to the hospital in order to have it replaced. ROS: Patient currently denies any vision changes, tinnitus, difficulty speaking, facial droop, sore throat, chest pain, shortness of breath, abdominal pain, nausea/vomiting/diarrhea, dysuria, or weakness/numbness/paresthesias in any extremity   Physical Exam  Triage Vital Signs: ED Triage Vitals [12/14/21 1237]  Enc Vitals Group     BP (!) 148/50     Pulse Rate 67     Resp 19     Temp 98.1 F (36.7 C)     Temp Source Oral     SpO2 100 %     Weight 175 lb 4.3 oz (79.5 kg)     Height '5\' 7"'$  (1.702 m)     Head Circumference      Peak Flow      Pain Score 0     Pain Loc      Pain Edu?      Excl. in [place]?    Most recent vital signs: Vitals:   12/14/21 1237 12/14/21 1446  BP: (!) 148/50 (!) 177/55  Pulse: 67 61  Resp: 19 (!) 21  Temp: 98.1 F (36.7 C) 97.8 F (36.6 C)  SpO2: 100% 100%   General: Awake, oriented x4. CV:  Good peripheral perfusion.  Resp:  Normal effort.  Abd:  No distention.  Other:  Elderly overweight African-American female laying in bed in no acute distress.  There is a Port-A-Cath with insertion site on the right upper chest wall and a Luer-Lok broken off in the arterial lumen ED Results / Procedures / Treatments  Labs (all labs ordered are listed, but only abnormal results are  displayed) Labs Reviewed - No data to display PROCEDURES: Critical Care performed: No Procedures MEDICATIONS ORDERED IN ED: Medications  0.9 %  sodium chloride infusion (has no administration in time range)  ceFAZolin (ANCEF) IVPB 1 g/50 mL premix (has no administration in time range)  ondansetron (ZOFRAN) injection 4 mg (has no administration in time range)  midazolam (VERSED) 2 MG/ML syrup 8 mg (has no administration in time range)  methylPREDNISolone sodium succinate (SOLU-MEDROL) 125 mg/2 mL injection 125 mg (has no administration in time range)  HYDROmorphone (DILAUDID) injection 1 mg (has no administration in time range)  diphenhydrAMINE (BENADRYL) injection 50 mg (has no administration in time range)  famotidine (PEPCID) tablet 40 mg (has no administration in time range)  ceFAZolin (ANCEF) 1-4 GM/50ML-% IVPB (has no administration in time range)   IMPRESSION / MDM / ASSESSMENT AND PLAN / ED COURSE  I reviewed the triage vital signs and the nursing notes.                             Patient's presentation is most consistent with acute presentation with potential threat to life or bodily function. Patient is an 88 year old patient end-stage renal disease on dialysis who  presents for a mechanical complication to her Port-A-Cath.  There appears to be a Luer-Lok that is stuck in the end of the arterial Port-A-Cath and cannot be removed at this time.  There is unfortunately no way to replace the affected portion of this catheter without replacing the entire catheter itself.  Therefore, I spoke to Dr. Delana Meyer in vascular surgery who agrees that patient will need to have this line changed out in the OR and has graciously agreed to add the patient on as a case today.  Dispo: To the OR for catheter placement   FINAL CLINICAL IMPRESSION(S) / ED DIAGNOSES   Final diagnoses:  Complication of cardiovascular infusion catheter, unspecified complication, initial encounter   Rx / DC Orders   ED  Discharge Orders     None      Note:  This document was prepared using Dragon voice recognition software and may include unintentional dictation errors.   Naaman Plummer, MD 12/14/21 1500

## 2021-12-14 NOTE — ED Notes (Signed)
This RN spoke with dialysis nurse, dialysis unable to replace attachment, suggest contacting vascular for assistance.

## 2021-12-14 NOTE — Op Note (Deleted)
MRN : [id number]  Jennifer Carrillo is a 88 y.o. ([birth year]) female who presents with chief complaint of check access.  History of Present Illness:   I am asked to see the patient by Dr. Cheri Fowler.  Patient is an 88 year old woman who is chronically maintained on hemodialysis.  She was sent to the emergency room today after the mall and of the arterial line was broken off and the hub of her tunneled dialysis catheter.  This happened at the end of her dialysis so she did receive a complete treatment.  Patient denies any shortness of breath she has not missed any other dialysis sessions.  She denies fever chills.  She denies tenderness at the catheter site or drainage from her catheter site   Current Meds  Medication Sig   amLODipine (NORVASC) 10 MG tablet TAKE 1 TABLET EVERY DAY AT BREAKFAST   anastrozole (ARIMIDEX) 1 MG tablet TAKE 1 TABLET EVERY DAY IN THE AFTERNOON (DINNER)   cholecalciferol (VITAMIN D) 25 MCG (1000 UNIT) tablet TAKE 1 TABLET EVERY MONDAY, WEDNESDAY, AND FRIDAY.   clopidogrel (PLAVIX) 75 MG tablet TAKE 1 TABLET DAILY AT DINNER   donepezil (ARICEPT) 10 MG tablet TAKE 1 TABLET AT BEDTIME FOR MEMORY (NEED MD APPOINTMENT)   isosorbide mononitrate (IMDUR) 60 MG 24 hr tablet Take 60 mg by mouth daily.   ranolazine (RANEXA) 500 MG 12 hr tablet Take 500 mg by mouth 2 (two) times daily; breakfast, Afternoon (dinner)    Past Medical History:  Diagnosis Date   Arthritis    Asthma    Calf pain    Cancer ([place]) 2016   left breast   COPD (chronic obstructive pulmonary disease) (HCC)    Diabetes (HCC)    Discoid lupus    Gastro-esophageal reflux    Glaucoma    Hyperlipemia    Hypertension    Iron deficiency anemia    Joint pain    Leg swelling    Osteoarthritis    PVD (peripheral vascular disease) ([place])    Sinus problem    Sleep apnea    No CPAP/ Can't tolerate   Stroke ([place]) 1987    Past Surgical History:  Procedure Laterality Date    BREAST SURGERY Left    left lumpectomy   CAROTID ANGIOGRAPHY Left 06/25/2018   Procedure: CAROTID ANGIOGRAPHY, possible intervention;  Surgeon: Katha Cabal, MD;  Location: [place] [facility];  Service: Cardiovascular;  Laterality: Left;   CAROTID ENDARTERECTOMY Right    CAROTID PTA/STENT INTERVENTION Left 06/25/2018   Procedure: CAROTID PTA/STENT INTERVENTION;  Surgeon: Katha Cabal, MD;  Location: [place] [facility];  Service: Cardiovascular;  Laterality: Left;   CATARACT EXTRACTION Right 2013   CORONARY STENT PLACEMENT     L. L. E.   DIALYSIS/PERMA CATHETER INSERTION N/A 12/29/2020   Procedure: DIALYSIS/PERMA CATHETER INSERTION;  Surgeon: Algernon Huxley, MD;  Location: [place] [facility];  Service: Cardiovascular;  Laterality: N/A;   EYE SURGERY Bilateral    cataract   LEFT HEART CATH AND CORONARY ANGIOGRAPHY Left 05/06/2018   Procedure: LEFT HEART CATH AND CORONARY ANGIOGRAPHY;  Surgeon: Corey Skains, MD;  Location: [place] [facility];  Service: Cardiovascular;  Laterality: Left;   LOWER EXTREMITY ANGIOGRAPHY Right 01/22/2018   Procedure: LOWER EXTREMITY ANGIOGRAPHY;  Surgeon: Katha Cabal, MD;  Location: [place] [facility];  Service:  Cardiovascular;  Laterality: Right;   RENAL ANGIOGRAPHY Left 02/26/2018   Procedure: RENAL ANGIOGRAPHY;  Surgeon: Katha Cabal, MD;  Location: [place] [facility];  Service: Cardiovascular;  Laterality: Left;   STENT PLACEMENT VASCULAR ([facility] HX) Left    stent placement on LLE   TUBAL LIGATION      Social History Social History   Tobacco Use   Smoking status: Former    Types: Cigarettes    Quit date: 1987    Years since quitting: 36.9   Smokeless tobacco: Never   Tobacco comments:    quit 31 years  Vaping Use   Vaping Use: Never used  Substance Use Topics   Alcohol use: No   Drug use: No    Family History Family History  Problem Relation Age of Onset   Heart disease Mother    Diabetes Sister    Leukemia  Daughter    Lung cancer Brother    Diabetes Other    Hypertension Other    Bladder Cancer Neg Hx    Kidney disease Neg Hx    Prostate cancer Neg Hx    Breast cancer Neg Hx     Allergies  Allergen Reactions   Benazepril Nausea Only     REVIEW OF SYSTEMS (Negative unless checked)  Constitutional: '[]'$ Weight loss  '[]'$ Fever  '[]'$ Chills Cardiac: '[]'$ Chest pain   '[]'$ Chest pressure   '[]'$ Palpitations   '[]'$ Shortness of breath when laying flat   '[]'$ Shortness of breath with exertion. Vascular:  '[]'$ Pain in legs with walking   '[]'$ Pain in legs at rest  '[]'$ History of DVT   '[]'$ Phlebitis   '[]'$ Swelling in legs   '[]'$ Varicose veins   '[]'$ Non-healing ulcers Pulmonary:   '[]'$ Uses home oxygen   '[]'$ Productive cough   '[]'$ Hemoptysis   '[]'$ Wheeze  '[]'$ COPD   '[]'$ Asthma Neurologic:  '[]'$ Dizziness   '[]'$ Seizures   '[]'$ History of stroke   '[]'$ History of TIA  '[]'$ Aphasia   '[]'$ Vissual changes   '[]'$ Weakness or numbness in arm   '[]'$ Weakness or numbness in leg Musculoskeletal:   '[]'$ Joint swelling   '[]'$ Joint pain   '[]'$ Low back pain Hematologic:  '[]'$ Easy bruising  '[]'$ Easy bleeding   '[]'$ Hypercoagulable state   '[]'$ Anemic Gastrointestinal:  '[]'$ Diarrhea   '[]'$ Vomiting  '[]'$ Gastroesophageal reflux/heartburn   '[]'$ Difficulty swallowing. Genitourinary:  '[x]'$ Chronic kidney disease   '[]'$ Difficult urination  '[]'$ Frequent urination   '[]'$ Blood in urine Skin:  '[]'$ Rashes   '[]'$ Ulcers  Psychological:  '[]'$ History of anxiety   '[]'$  History of major depression.  Physical Examination  Vitals:   12/14/21 1237 12/14/21 1446 12/14/21 1744  BP: (!) 148/50 (!) 177/55   Pulse: 67 61   Resp: 19 (!) 21   Temp: 98.1 F (36.7 C) 97.8 F (36.6 C)   TempSrc: Oral Oral   SpO2: 100% 100% 100%  Weight: 79.5 kg 74.8 kg   Height: '5\' 7"'$  (1.702 m) '5\' 5"'$  (1.651 m)    Body mass index is 27.46 kg/m. Gen: WD/WN, NAD Head: [place]/AT, No temporalis wasting.  Ear/Nose/Throat: Hearing grossly intact, nares w/o erythema or drainage Eyes: PER, EOMI, sclera nonicteric.  Neck: Supple, no gross masses or lesions.  No JVD.   Pulmonary:  Good air movement, no audible wheezing, no use of accessory muscles.  Cardiac: RRR, precordium non-hyperdynamic. Vascular:   Nonfunctioning dialysis catheter right IJ no evidence for cellulitis or infection surrounding the catheter at the exit site Vessel Right Left  Radial Palpable Palpable  Brachial Palpable Palpable  Gastrointestinal: soft, non-distended. No guarding/no peritoneal signs.  Musculoskeletal: M/S 5/5 throughout.  No deformity.  Neurologic: CN 2-12 intact. Pain and light touch intact in extremities.  Symmetrical.  Speech is fluent. Motor exam as listed above. Psychiatric: Judgment intact, Mood & affect appropriate for pt's clinical situation. Dermatologic: No rashes or ulcers noted.  No changes consistent with cellulitis.   CBC Lab Results  Component Value Date   WBC 5.8 10/29/2021   HGB 7.4 (L) 10/29/2021   HCT 24.7 (L) 10/29/2021   MCV 93.6 10/29/2021   PLT 206 10/29/2021    BMET    Component Value Date/Time   NA 143 10/29/2021 0500   NA 142 08/12/2020 1032   NA 142 05/20/2013 0509   K 2.8 (L) 10/29/2021 0500   K 3.7 05/20/2013 0509   CL 116 (H) 10/29/2021 0500   CL 108 (H) 05/20/2013 0509   CO2 21 (L) 10/29/2021 0500   CO2 30 05/20/2013 0509   GLUCOSE 98 10/29/2021 0500   GLUCOSE 108 (H) 05/20/2013 0509   BUN 22 10/29/2021 0500   BUN 37 (H) 08/12/2020 1032   BUN 13 05/20/2013 0509   CREATININE 2.91 (H) 10/29/2021 0500   CREATININE 0.92 05/20/2013 0509   CALCIUM 7.6 (L) 10/29/2021 0500   CALCIUM 8.9 05/20/2013 0509   GFRNONAA 15 (L) 10/29/2021 0500   GFRNONAA >60 05/20/2013 0509   GFRAA 31 (L) 02/13/2019 1045   GFRAA >60 05/20/2013 0509   CrCl cannot be calculated (Patient's most recent lab result is older than the maximum 21 days allowed.).  COAG Lab Results  Component Value Date   INR 1.3 (H) 12/19/2020    Radiology PERIPHERAL VASCULAR CATHETERIZATION  Result Date: 12/14/2021 See surgical note for result.     Assessment/Plan  End-stage renal disease: The patient's tunneled dialysis catheter is not repairable.  She will require exchange over a wire.  Risk and benefits been reviewed all questions answered patient agrees to proceed.  We will plan to do this exchange as soon as possible so that she may go home and continue dialysis as scheduled as an outpatient Coronary artery disease: Continue cardiac and antihypertensive medications as already ordered and reviewed, no changes at this time.  Continue statin as ordered and reviewed, no changes at this time  Nitrates PRN for chest pain Diabetes mellitus: Continue hypoglycemic medications as already ordered, these medications have been reviewed and there are no changes at this time.  Hgb A1C to be monitored as already arranged by primary service Hypertension: Continue antihypertensive medications as already ordered, these medications have been reviewed and there are no changes at this time.   Hortencia Pilar, MD  12/14/2021 6:11 PM

## 2021-12-14 NOTE — ED Triage Notes (Signed)
The lure lock end of patient's arterial lumen from her port-a-cath broke when dialysis nurse was de-accessing after she completed dialysis

## 2021-12-14 NOTE — Op Note (Signed)
OPERATIVE NOTE   PROCEDURE: Insertion of tunneled dialysis catheter right IJ approach same venous access.  PRE-OPERATIVE DIAGNOSIS: Nonfunction of existing tunneled dialysis catheter, and stage renal disease requiring hemodialysis   POST-OPERATIVE DIAGNOSIS: Same SURGEON: Hortencia Pilar  ANESTHESIA: Conscious sedation was administered under my direct supervision by the interventional radiology RN.  IV Versed plus fentanyl were utilized. Continuous ECG, pulse oximetry and blood pressure was monitored throughout the entire procedure.  Conscious sedation was for a total of 15 minutes.  ESTIMATED BLOOD LOSS: Minimal cc  CONTRAST USED:  None  FLUOROSCOPY TIME: 0.7 minutes  INDICATIONS:   Jennifer Carrillo is a 88 y.o.y.o. female who presents with poor flow and nonfunction of the tunneled dialysis catheter.  Adequate dialysis has not been possible.  DESCRIPTION: After obtaining full informed written consent, the patient was positioned supine. The right neck and chest wall was prepped and draped in a sterile fashion. The cuff is localized and using blunt and sharp dissection it is freed from the surrounding adhesions.  The existing catheter is then transected proximal to the cuff.  The guidewire is advanced without difficulty under fluoroscopy.  Dilators are passed over the wire as needed and the tunneled dialysis catheter is fed into the central venous system without difficulty.  Under fluoroscopy the catheter tip positioned at the atrial caval junction.  Both lumens aspirate and flush easily. After verification of smooth contour with proper tip position under fluoroscopy the catheter is packed with 5000 units of heparin per lumen.  Catheter secured to the skin of the right chest wall with 0 silk. A sterile dressing is applied with a Biopatch.  COMPLICATIONS: None  CONDITION: Good  Hortencia Pilar [facility] Office:  [phone]   12/14/2021,6:25 PM

## 2021-12-15 ENCOUNTER — Encounter: Payer: Self-pay | Admitting: Vascular Surgery

## 2021-12-15 LAB — GLUCOSE, CAPILLARY: Glucose-Capillary: 122 mg/dL — ABNORMAL HIGH (ref 70–99)

## 2021-12-16 DIAGNOSIS — N2581 Secondary hyperparathyroidism of renal origin: Secondary | ICD-10-CM | POA: Diagnosis not present

## 2021-12-16 DIAGNOSIS — N186 End stage renal disease: Secondary | ICD-10-CM | POA: Diagnosis not present

## 2021-12-16 DIAGNOSIS — Z992 Dependence on renal dialysis: Secondary | ICD-10-CM | POA: Diagnosis not present

## 2021-12-18 DIAGNOSIS — I2510 Atherosclerotic heart disease of native coronary artery without angina pectoris: Secondary | ICD-10-CM | POA: Diagnosis not present

## 2021-12-18 DIAGNOSIS — I70219 Atherosclerosis of native arteries of extremities with intermittent claudication, unspecified extremity: Secondary | ICD-10-CM | POA: Diagnosis not present

## 2021-12-18 DIAGNOSIS — E782 Mixed hyperlipidemia: Secondary | ICD-10-CM | POA: Diagnosis not present

## 2021-12-18 DIAGNOSIS — I10 Essential (primary) hypertension: Secondary | ICD-10-CM | POA: Diagnosis not present

## 2021-12-18 DIAGNOSIS — I119 Hypertensive heart disease without heart failure: Secondary | ICD-10-CM | POA: Diagnosis not present

## 2021-12-19 DIAGNOSIS — Z992 Dependence on renal dialysis: Secondary | ICD-10-CM | POA: Diagnosis not present

## 2021-12-19 DIAGNOSIS — N186 End stage renal disease: Secondary | ICD-10-CM | POA: Diagnosis not present

## 2021-12-19 DIAGNOSIS — N2581 Secondary hyperparathyroidism of renal origin: Secondary | ICD-10-CM | POA: Diagnosis not present

## 2021-12-21 DIAGNOSIS — N186 End stage renal disease: Secondary | ICD-10-CM | POA: Diagnosis not present

## 2021-12-21 DIAGNOSIS — N2581 Secondary hyperparathyroidism of renal origin: Secondary | ICD-10-CM | POA: Diagnosis not present

## 2021-12-21 DIAGNOSIS — Z992 Dependence on renal dialysis: Secondary | ICD-10-CM | POA: Diagnosis not present

## 2021-12-23 DIAGNOSIS — N186 End stage renal disease: Secondary | ICD-10-CM | POA: Diagnosis not present

## 2021-12-23 DIAGNOSIS — Z992 Dependence on renal dialysis: Secondary | ICD-10-CM | POA: Diagnosis not present

## 2021-12-23 DIAGNOSIS — N2581 Secondary hyperparathyroidism of renal origin: Secondary | ICD-10-CM | POA: Diagnosis not present

## 2021-12-26 DIAGNOSIS — N186 End stage renal disease: Secondary | ICD-10-CM | POA: Diagnosis not present

## 2021-12-26 DIAGNOSIS — N2581 Secondary hyperparathyroidism of renal origin: Secondary | ICD-10-CM | POA: Diagnosis not present

## 2021-12-26 DIAGNOSIS — Z992 Dependence on renal dialysis: Secondary | ICD-10-CM | POA: Diagnosis not present

## 2021-12-28 DIAGNOSIS — N186 End stage renal disease: Secondary | ICD-10-CM | POA: Diagnosis not present

## 2021-12-28 DIAGNOSIS — N2581 Secondary hyperparathyroidism of renal origin: Secondary | ICD-10-CM | POA: Diagnosis not present

## 2021-12-28 DIAGNOSIS — Z992 Dependence on renal dialysis: Secondary | ICD-10-CM | POA: Diagnosis not present

## 2021-12-30 DIAGNOSIS — N2581 Secondary hyperparathyroidism of renal origin: Secondary | ICD-10-CM | POA: Diagnosis not present

## 2021-12-30 DIAGNOSIS — N186 End stage renal disease: Secondary | ICD-10-CM | POA: Diagnosis not present

## 2021-12-30 DIAGNOSIS — Z992 Dependence on renal dialysis: Secondary | ICD-10-CM | POA: Diagnosis not present

## 2021-12-31 ENCOUNTER — Other Ambulatory Visit: Payer: Self-pay | Admitting: Physician Assistant

## 2021-12-31 DIAGNOSIS — I70203 Unspecified atherosclerosis of native arteries of extremities, bilateral legs: Secondary | ICD-10-CM

## 2022-01-02 DIAGNOSIS — Z794 Long term (current) use of insulin: Secondary | ICD-10-CM | POA: Diagnosis not present

## 2022-01-02 DIAGNOSIS — N2581 Secondary hyperparathyroidism of renal origin: Secondary | ICD-10-CM | POA: Diagnosis not present

## 2022-01-02 DIAGNOSIS — N186 End stage renal disease: Secondary | ICD-10-CM | POA: Diagnosis not present

## 2022-01-02 DIAGNOSIS — Z992 Dependence on renal dialysis: Secondary | ICD-10-CM | POA: Diagnosis not present

## 2022-01-02 DIAGNOSIS — E119 Type 2 diabetes mellitus without complications: Secondary | ICD-10-CM | POA: Diagnosis not present

## 2022-01-02 NOTE — Telephone Encounter (Signed)
Pt gets this med by cardiology

## 2022-01-04 ENCOUNTER — Emergency Department
Admission: EM | Admit: 2022-01-04 | Discharge: 2022-01-04 | Disposition: A | Discharge status: Home / Self Care | Payer: Medicare HMO | Attending: Emergency Medicine | Admitting: Emergency Medicine

## 2022-01-04 ENCOUNTER — Encounter: Payer: Self-pay | Admitting: Emergency Medicine

## 2022-01-04 ENCOUNTER — Emergency Department: Payer: Medicare HMO

## 2022-01-04 ENCOUNTER — Other Ambulatory Visit: Payer: Self-pay

## 2022-01-04 DIAGNOSIS — R0689 Other abnormalities of breathing: Secondary | ICD-10-CM | POA: Diagnosis not present

## 2022-01-04 DIAGNOSIS — N186 End stage renal disease: Secondary | ICD-10-CM | POA: Insufficient documentation

## 2022-01-04 DIAGNOSIS — H1033 Unspecified acute conjunctivitis, bilateral: Secondary | ICD-10-CM | POA: Diagnosis not present

## 2022-01-04 DIAGNOSIS — R069 Unspecified abnormalities of breathing: Secondary | ICD-10-CM | POA: Diagnosis not present

## 2022-01-04 DIAGNOSIS — Z992 Dependence on renal dialysis: Secondary | ICD-10-CM | POA: Diagnosis not present

## 2022-01-04 DIAGNOSIS — G4489 Other headache syndrome: Secondary | ICD-10-CM | POA: Diagnosis not present

## 2022-01-04 DIAGNOSIS — N2581 Secondary hyperparathyroidism of renal origin: Secondary | ICD-10-CM | POA: Diagnosis not present

## 2022-01-04 DIAGNOSIS — Z1152 Encounter for screening for COVID-19: Secondary | ICD-10-CM | POA: Diagnosis not present

## 2022-01-04 DIAGNOSIS — R42 Dizziness and giddiness: Secondary | ICD-10-CM | POA: Diagnosis not present

## 2022-01-04 DIAGNOSIS — R0602 Shortness of breath: Secondary | ICD-10-CM | POA: Insufficient documentation

## 2022-01-04 DIAGNOSIS — R000 Tachycardia, unspecified: Secondary | ICD-10-CM | POA: Diagnosis not present

## 2022-01-04 LAB — CBC WITH DIFFERENTIAL/PLATELET
Abs Immature Granulocytes: 0.08 10*3/uL — ABNORMAL HIGH (ref 0.00–0.07)
Basophils Absolute: 0.1 10*3/uL (ref 0.0–0.1)
Basophils Relative: 1 %
Eosinophils Absolute: 0.3 10*3/uL (ref 0.0–0.5)
Eosinophils Relative: 4 %
HCT: 37.9 % (ref 36.0–46.0)
Hemoglobin: 11.5 g/dL — ABNORMAL LOW (ref 12.0–15.0)
Immature Granulocytes: 1 %
Lymphocytes Relative: 33 %
Lymphs Abs: 2.6 10*3/uL (ref 0.7–4.0)
MCH: 27.6 pg (ref 26.0–34.0)
MCHC: 30.3 g/dL (ref 30.0–36.0)
MCV: 90.9 fL (ref 80.0–100.0)
Monocytes Absolute: 0.8 10*3/uL (ref 0.1–1.0)
Monocytes Relative: 10 %
Neutro Abs: 4.1 10*3/uL (ref 1.7–7.7)
Neutrophils Relative %: 51 %
Platelets: 271 10*3/uL (ref 150–400)
RBC: 4.17 MIL/uL (ref 3.87–5.11)
RDW: 16.5 % — ABNORMAL HIGH (ref 11.5–15.5)
WBC: 7.9 10*3/uL (ref 4.0–10.5)
nRBC: 0 % (ref 0.0–0.2)

## 2022-01-04 LAB — BASIC METABOLIC PANEL
Anion gap: 15 (ref 5–15)
BUN: 24 mg/dL — ABNORMAL HIGH (ref 8–23)
CO2: 17 mmol/L — ABNORMAL LOW (ref 22–32)
Calcium: 9.6 mg/dL (ref 8.9–10.3)
Chloride: 107 mmol/L (ref 98–111)
Creatinine, Ser: 2.95 mg/dL — ABNORMAL HIGH (ref 0.44–1.00)
GFR, Estimated: 15 mL/min — ABNORMAL LOW (ref >60)
Glucose, Bld: 159 mg/dL — ABNORMAL HIGH (ref 70–99)
Potassium: 3.9 mmol/L (ref 3.5–5.1)
Sodium: 139 mmol/L (ref 135–145)

## 2022-01-04 LAB — RESP PANEL BY RT-PCR (RSV, FLU A&B, COVID)  RVPGX2
Influenza A by PCR: NEGATIVE
Influenza B by PCR: NEGATIVE
Resp Syncytial Virus by PCR: NEGATIVE
SARS Coronavirus 2 by RT PCR: NEGATIVE

## 2022-01-04 NOTE — ED Triage Notes (Signed)
Patient reports during dialysis she became short of breath and swimmy headed. Patient unsure of how much treatment was done. Patient denies any pain just states she feels anxious.

## 2022-01-04 NOTE — ED Provider Notes (Signed)
[facility] Provider Note    Event Date/Time   First MD Initiated Contact with Patient 01/04/22 1336     (approximate)   History   Shortness of Breath   HPI  Jennifer Carrillo is a 88 y.o. female past medical history significant for ESRD on HD T\TS presents to the emergency department following an episode of shortness of breath while at dialysis.  States that she went to dialysis and felt in her normal state of health.  Whenever she was about 10 minutes completing the end of her session she had an episode where she felt very short of breath.  Felt very anxious and like she cannot catch her breath.  Had a headache at that time.  States that it was a mild headache.  Upon arriving to the emergency department states that she is feeling much better.  Not having any chest pain or shortness of breath at this time.  Denies cough.  Does endorse recent bilateral red eyes.  Denies fever.  No nausea or vomiting.     Physical Exam   Triage Vital Signs: ED Triage Vitals  Enc Vitals Group     BP 01/04/22 1116 (!) 169/54     Pulse Rate 01/04/22 1116 75     Resp 01/04/22 1116 20     Temp 01/04/22 1116 97.9 F (36.6 C)     Temp Source 01/04/22 1116 Oral     SpO2 01/04/22 1116 100 %     Weight 01/04/22 1117 165 lb (74.8 kg)     Height 01/04/22 1117 '5\' 5"'$  (1.651 m)     Head Circumference --      Peak Flow --      Pain Score 01/04/22 1116 0     Pain Loc --      Pain Edu? --      Excl. in [place]? --     Most recent vital signs: Vitals:   01/04/22 1116 01/04/22 1429  BP: (!) 169/54 (!) 188/57  Pulse: 75 (!) 59  Resp: 20 18  Temp: 97.9 F (36.6 C)   SpO2: 100% 100%    Physical Exam Constitutional:      Appearance: She is well-developed.  HENT:     Head: Atraumatic.  Eyes:     Conjunctiva/sclera: Conjunctivae normal.  Cardiovascular:     Rate and Rhythm: Regular rhythm.  Pulmonary:     Effort: No respiratory distress.  Abdominal:     General: There is no  distension.  Musculoskeletal:        General: Normal range of motion.     Cervical back: Normal range of motion.  Skin:    General: Skin is warm.  Neurological:     Mental Status: She is alert. Mental status is at baseline.     IMPRESSION / MDM / ASSESSMENT AND PLAN / ED COURSE  I reviewed the triage vital signs and the nursing notes.  Differential diagnosis including pneumonia, viral illness, electrolyte abnormality, dehydration, COVID/influenza.  Low suspicion for pulmonary embolism or ACS.   EKG  I, Nathaniel Man, the attending physician, personally viewed and interpreted this ECG.   Rate: Normal  Rhythm: Normal sinus  Axis: Normal  Intervals: Right bundle branch block -unchanged when compared to prior EKG  ST&T Change: None  No tachycardic or bradycardic dysrhythmias while on cardiac telemetry.  RADIOLOGY I independently reviewed imaging, my interpretation of imaging: Chest x-ray with no focal findings consistent with pneumonia.  Read as no  acute findings  LABS (all labs ordered are listed, but only abnormal results are displayed) Labs interpreted as -  No findings concerning for emergent dialysis.  No significant anemia.  No significant lecture light abnormalities.  Does have a CO2 of 17 however does completed dialysis.  No other signs or symptoms concerning for a bacterial infection.  Patient is feeling much better.  No symptoms at this time.  Low suspicion for ACS or PE.  Labs Reviewed  CBC WITH DIFFERENTIAL/PLATELET - Abnormal; Notable for the following components:      Result Value   Hemoglobin 11.5 (*)    RDW 16.5 (*)    Abs Immature Granulocytes 0.08 (*)    All other components within normal limits  BASIC METABOLIC PANEL - Abnormal; Notable for the following components:   CO2 17 (*)    Glucose, Bld 159 (*)    BUN 24 (*)    Creatinine, Ser 2.95 (*)    GFR, Estimated 15 (*)    All other components within normal limits  RESP PANEL BY RT-PCR (RSV, FLU A&B,  COVID)  [id number]    TREATMENT   Most likely with viral illness.  Given return precautions.  Discussed follow-up with primary care physician and return if her symptoms return.   PROCEDURES:  Critical Care performed: No  Procedures  Patient's presentation is most consistent with acute presentation with potential threat to life or bodily function.   MEDICATIONS ORDERED IN ED: Medications - No data to display  FINAL CLINICAL IMPRESSION(S) / ED DIAGNOSES   Final diagnoses:  SOB (shortness of breath)  Acute conjunctivitis of both eyes, unspecified acute conjunctivitis type     Rx / DC Orders   ED Discharge Orders     None        Note:  This document was prepared using Dragon voice recognition software and may include unintentional dictation errors.   Nathaniel Man, MD 01/04/22 [phone]

## 2022-01-04 NOTE — Discharge Instructions (Addendum)
You were seen in the emergency department following an episode of shortness of breath while at dialysis today.  You had a chest x-ray done that did not show any signs of pneumonia.  You had COVID and influenza testing done and it has not yet resulted.  You will be called if it is positive.  You can also check your results online through [place].  It is importantly return to the emergency department if you have return of your symptoms.  Call your primary care physician to schedule close follow-up.

## 2022-01-04 NOTE — ED Triage Notes (Signed)
First Nurse Note:  Arrives from [facility] via [employer].  Had treatment early today due to SOB x 1 day.  Patient had full treatment.  Lungs CTA.  RA 100% spo2.  Denies complaint.  Initially patient tachypneaic, improved with emotional support.  Vs wnl.

## 2022-01-05 ENCOUNTER — Ambulatory Visit (INDEPENDENT_AMBULATORY_CARE_PROVIDER_SITE_OTHER): Payer: Medicare HMO | Admitting: Nurse Practitioner

## 2022-01-06 DIAGNOSIS — N2581 Secondary hyperparathyroidism of renal origin: Secondary | ICD-10-CM | POA: Diagnosis not present

## 2022-01-06 DIAGNOSIS — Z992 Dependence on renal dialysis: Secondary | ICD-10-CM | POA: Diagnosis not present

## 2022-01-06 DIAGNOSIS — N186 End stage renal disease: Secondary | ICD-10-CM | POA: Diagnosis not present

## 2022-01-07 DIAGNOSIS — Z992 Dependence on renal dialysis: Secondary | ICD-10-CM | POA: Diagnosis not present

## 2022-01-07 DIAGNOSIS — N186 End stage renal disease: Secondary | ICD-10-CM | POA: Diagnosis not present

## 2022-01-09 DIAGNOSIS — Z992 Dependence on renal dialysis: Secondary | ICD-10-CM | POA: Diagnosis not present

## 2022-01-09 DIAGNOSIS — N2581 Secondary hyperparathyroidism of renal origin: Secondary | ICD-10-CM | POA: Diagnosis not present

## 2022-01-09 DIAGNOSIS — N186 End stage renal disease: Secondary | ICD-10-CM | POA: Diagnosis not present

## 2022-01-11 ENCOUNTER — Telehealth: Payer: Self-pay | Admitting: Internal Medicine

## 2022-01-11 DIAGNOSIS — Z992 Dependence on renal dialysis: Secondary | ICD-10-CM | POA: Diagnosis not present

## 2022-01-11 DIAGNOSIS — N2581 Secondary hyperparathyroidism of renal origin: Secondary | ICD-10-CM | POA: Diagnosis not present

## 2022-01-11 DIAGNOSIS — N186 End stage renal disease: Secondary | ICD-10-CM | POA: Diagnosis not present

## 2022-01-11 NOTE — Telephone Encounter (Signed)
S/w patient to schedule ED follow up. Patient stated she is fine and will call if she needs to be seen-Toni

## 2022-01-12 ENCOUNTER — Ambulatory Visit (INDEPENDENT_AMBULATORY_CARE_PROVIDER_SITE_OTHER): Payer: Medicare HMO | Admitting: Nurse Practitioner

## 2022-01-12 ENCOUNTER — Encounter (INDEPENDENT_AMBULATORY_CARE_PROVIDER_SITE_OTHER): Payer: Self-pay | Admitting: Nurse Practitioner

## 2022-01-12 VITALS — BP 199/74 | HR 57 | Resp 14 | Ht 67.0 in | Wt 188.0 lb

## 2022-01-12 DIAGNOSIS — M1990 Unspecified osteoarthritis, unspecified site: Secondary | ICD-10-CM | POA: Insufficient documentation

## 2022-01-12 DIAGNOSIS — Z992 Dependence on renal dialysis: Secondary | ICD-10-CM

## 2022-01-12 DIAGNOSIS — N186 End stage renal disease: Secondary | ICD-10-CM | POA: Diagnosis not present

## 2022-01-12 DIAGNOSIS — N184 Chronic kidney disease, stage 4 (severe): Secondary | ICD-10-CM | POA: Insufficient documentation

## 2022-01-12 DIAGNOSIS — E1122 Type 2 diabetes mellitus with diabetic chronic kidney disease: Secondary | ICD-10-CM

## 2022-01-12 DIAGNOSIS — I779 Disorder of arteries and arterioles, unspecified: Secondary | ICD-10-CM | POA: Insufficient documentation

## 2022-01-12 DIAGNOSIS — M48061 Spinal stenosis, lumbar region without neurogenic claudication: Secondary | ICD-10-CM | POA: Insufficient documentation

## 2022-01-13 DIAGNOSIS — N2581 Secondary hyperparathyroidism of renal origin: Secondary | ICD-10-CM | POA: Diagnosis not present

## 2022-01-13 DIAGNOSIS — N186 End stage renal disease: Secondary | ICD-10-CM | POA: Diagnosis not present

## 2022-01-13 DIAGNOSIS — Z992 Dependence on renal dialysis: Secondary | ICD-10-CM | POA: Diagnosis not present

## 2022-01-15 NOTE — Progress Notes (Signed)
Subjective:    Patient ID: Jennifer Carrillo, female    DOB: 1933/09/25, 89 y.o.   MRN: [id number] Chief Complaint  Patient presents with   Follow-up    3 weeks no studies    Jennifer Carrillo is an 89 year old female who presents today for evaluation of a right chest PermCath that was placed after the clamp broke.  She notes that it is working well without any significant issues.    Review of Systems  Neurological:  Positive for weakness.  All other systems reviewed and are negative.      Objective:   Physical Exam Vitals reviewed.  HENT:     Head: Normocephalic.  Cardiovascular:     Rate and Rhythm: Normal rate.  Pulmonary:     Effort: Pulmonary effort is normal.  Skin:    General: Skin is warm and dry.  Neurological:     Mental Status: She is alert and oriented to person, place, and time.     Motor: Weakness present.     Gait: Gait abnormal.  Psychiatric:        Mood and Affect: Mood normal.        Behavior: Behavior normal.        Thought Content: Thought content normal.        Judgment: Judgment normal.     BP (!) 199/74 (BP Location: Right Arm)   Pulse (!) 57   Resp 14   Ht '5\' 7"'$  (1.702 m)   Wt 188 lb (85.3 kg)   BMI 29.44 kg/m   Past Medical History:  Diagnosis Date   Arthritis    Asthma    Calf pain    Cancer ([place]) 2016   left breast   COPD (chronic obstructive pulmonary disease) (HCC)    Diabetes (HCC)    Discoid lupus    Gastro-esophageal reflux    Glaucoma    Hyperlipemia    Hypertension    Iron deficiency anemia    Joint pain    Leg swelling    Osteoarthritis    PVD (peripheral vascular disease) (HCC)    Sinus problem    Sleep apnea    No CPAP/ Can't tolerate   Stroke [facility]) 1987    Social History   Socioeconomic History   Marital status: Married    Spouse name: Not on file   Number of children: Not on file   Years of education: Not on file   Highest education level: Not on file  Occupational History   Not on file  Tobacco  Use   Smoking status: Former    Types: Cigarettes    Quit date: 57    Years since quitting: 37.0   Smokeless tobacco: Never   Tobacco comments:    quit 31 years  Vaping Use   Vaping Use: Never used  Substance and Sexual Activity   Alcohol use: No   Drug use: No   Sexual activity: Not on file  Other Topics Concern   Not on file  Social History Narrative   Walks with a rolling walker; remote hx of smoking; no alcohol; [place]- with husband/son. Daughter lives in [place].    Social Determinants of Health   Financial Resource Strain: Low Risk  (06/23/2020)   Overall Financial Resource Strain (CARDIA)    Difficulty of Paying Living Expenses: Not very hard  Food Insecurity: No Food Insecurity (01/06/2021)   Hunger Vital Sign    Worried About Running Out of Food in the Last Year:  Never true    Ran Out of Food in the Last Year: Never true  Transportation Needs: No Transportation Needs (01/06/2021)   PRAPARE - Hydrologist (Medical): No    Lack of Transportation (Non-Medical): No  Physical Activity: Not on file  Stress: Not on file  Social Connections: Not on file  Intimate Partner Violence: Not on file    Past Surgical History:  Procedure Laterality Date   BREAST SURGERY Left    left lumpectomy   CAROTID ANGIOGRAPHY Left 06/25/2018   Procedure: CAROTID ANGIOGRAPHY, possible intervention;  Surgeon: Katha Cabal, MD;  Location: [place] [facility];  Service: Cardiovascular;  Laterality: Left;   CAROTID ENDARTERECTOMY Right    CAROTID PTA/STENT INTERVENTION Left 06/25/2018   Procedure: CAROTID PTA/STENT INTERVENTION;  Surgeon: Katha Cabal, MD;  Location: [place] [facility];  Service: Cardiovascular;  Laterality: Left;   CATARACT EXTRACTION Right 2013   CORONARY STENT PLACEMENT     L. L. E.   DIALYSIS/PERMA CATHETER INSERTION N/A 12/29/2020   Procedure: DIALYSIS/PERMA CATHETER INSERTION;  Surgeon: Algernon Huxley, MD;  Location: [place] [facility];  Service: Cardiovascular;  Laterality: N/A;   DIALYSIS/PERMA CATHETER INSERTION N/A 12/14/2021   Procedure: DIALYSIS/PERMA CATHETER INSERTION;  Surgeon: Katha Cabal, MD;  Location: [place] [facility];  Service: Cardiovascular;  Laterality: N/A;   EYE SURGERY Bilateral    cataract   LEFT HEART CATH AND CORONARY ANGIOGRAPHY Left 05/06/2018   Procedure: LEFT HEART CATH AND CORONARY ANGIOGRAPHY;  Surgeon: Corey Skains, MD;  Location: [place] [facility];  Service: Cardiovascular;  Laterality: Left;   LOWER EXTREMITY ANGIOGRAPHY Right 01/22/2018   Procedure: LOWER EXTREMITY ANGIOGRAPHY;  Surgeon: Katha Cabal, MD;  Location: [place] [facility];  Service: Cardiovascular;  Laterality: Right;   RENAL ANGIOGRAPHY Left 02/26/2018   Procedure: RENAL ANGIOGRAPHY;  Surgeon: Katha Cabal, MD;  Location: [place] [facility];  Service: Cardiovascular;  Laterality: Left;   STENT PLACEMENT VASCULAR ([facility] HX) Left    stent placement on LLE   TUBAL LIGATION      Family History  Problem Relation Age of Onset   Heart disease Mother    Diabetes Sister    Leukemia Daughter    Lung cancer Brother    Diabetes Other    Hypertension Other    Bladder Cancer Neg Hx    Kidney disease Neg Hx    Prostate cancer Neg Hx    Breast cancer Neg Hx     Allergies  Allergen Reactions   Benazepril Nausea Only       Latest Ref Rng & Units 01/04/2022   11:30 AM 10/29/2021    5:00 AM 10/28/2021    2:01 PM  CBC  WBC 4.0 - 10.5 K/uL 7.9  5.8  9.9   Hemoglobin 12.0 - 15.0 g/dL 11.5  7.4  10.8   Hematocrit 36.0 - 46.0 % 37.9  24.7  34.9   Platelets 150 - 400 K/uL 271  206  283       CMP     Component Value Date/Time   NA 139 01/04/2022 1130   NA 142 08/12/2020 1032   NA 142 05/20/2013 0509   K 3.9 01/04/2022 1130   K 3.7 05/20/2013 0509   CL 107 01/04/2022 1130   CL 108 (H) 05/20/2013 0509   CO2 17 (L) 01/04/2022 1130   CO2 30 05/20/2013 0509   GLUCOSE 159 (H)  01/04/2022  1130   GLUCOSE 108 (H) 05/20/2013 0509   BUN 24 (H) 01/04/2022 1130   BUN 37 (H) 08/12/2020 1032   BUN 13 05/20/2013 0509   CREATININE 2.95 (H) 01/04/2022 1130   CREATININE 0.92 05/20/2013 0509   CALCIUM 9.6 01/04/2022 1130   CALCIUM 8.9 05/20/2013 0509   PROT 7.5 10/28/2021 1401   PROT 6.4 08/12/2020 1032   PROT 8.0 05/18/2013 1305   ALBUMIN 4.0 10/28/2021 1401   ALBUMIN 3.6 08/12/2020 1032   ALBUMIN 3.3 (L) 05/18/2013 1305   AST 32 10/28/2021 1401   AST 21 05/18/2013 1305   ALT 21 10/28/2021 1401   ALT 17 05/18/2013 1305   ALKPHOS 100 10/28/2021 1401   ALKPHOS 104 05/18/2013 1305   BILITOT 0.3 10/28/2021 1401   BILITOT 0.2 08/12/2020 1032   BILITOT 0.3 05/18/2013 1305   GFRNONAA 15 (L) 01/04/2022 1130   GFRNONAA >60 05/20/2013 0509   GFRAA 31 (L) 02/13/2019 1045   GFRAA >60 05/20/2013 0509     No results found.     Assessment & Plan:   1. End stage renal disease on dialysis due to type 2 diabetes mellitus ([place]) I had a long discussion with the patient regarding her dialysis access and a more permanent access in her upper extremity.  We discussed vein mapping and what that would entail.  We discussed the option for a fistula and graft.  We also discussed the danger of having a PermCath for long-term including possible stenosis of the central veins in addition to infection however following this discussion the patient has no interest whatsoever in moving forward with any upper extremity access.  She wishes to continue with her PermCath.  Patient advised that if she changes her mind we will be happy to have her undergo vein mapping.   Current Outpatient Medications on File Prior to Visit  Medication Sig Dispense Refill   ACCU-CHEK GUIDE test strip TEST BLOOD SUGAR THREE TIMES DAILY  AS  DIRECTED 300 strip 1   Accu-Chek Softclix Lancets lancets USE AS INSTRUCTED 3 TIMES A DAY 300 each 3   acetaminophen (TYLENOL) 500 MG tablet Take 1 tablet (500 mg total) by mouth  every 6 (six) hours as needed for moderate pain or headache. 30 tablet 0   Alcohol Swabs (B-D SINGLE USE SWABS REGULAR) PADS Use as directed for 3 times daily DX E11.65 100 each 1   amLODipine (NORVASC) 10 MG tablet TAKE 1 TABLET EVERY DAY AT BREAKFAST 90 tablet 1   anastrozole (ARIMIDEX) 1 MG tablet TAKE 1 TABLET EVERY DAY IN THE AFTERNOON (DINNER) 90 tablet 1   Blood Glucose Monitoring Suppl (ACCU-CHEK GUIDE ME) w/Device KIT Use as instructed. DX e11.65 1 kit 0   cholecalciferol (VITAMIN D) 25 MCG (1000 UNIT) tablet TAKE 1 TABLET EVERY MONDAY, WEDNESDAY, AND FRIDAY. 39 tablet 3   clopidogrel (PLAVIX) 75 MG tablet TAKE 1 TABLET DAILY AT DINNER 90 tablet 1   docusate sodium (COLACE) 100 MG capsule Take 200 mg by mouth at bedtime as needed for mild constipation.     donepezil (ARICEPT) 10 MG tablet TAKE 1 TABLET AT BEDTIME FOR MEMORY (NEED MD APPOINTMENT) 90 tablet 1   fluticasone-salmeterol (ADVAIR) 100-50 MCG/ACT AEPB Inhale 1 puff into the lungs 2 (two) times daily. 1 each 3   insulin NPH-regular Human (70-30) 100 UNIT/ML injection Inject 12 Units into the skin 2 (two) times daily. Inject 20 units subcutaneously in the morning and 20 units in the evening. (Patient taking differently:  Inject 4 Units into the skin 2 (two) times daily. Inject 20 units subcutaneously in the morning and 20 units in the evening.) 10 mL 11   isosorbide mononitrate (IMDUR) 60 MG 24 hr tablet Take 60 mg by mouth daily.     ondansetron (ZOFRAN) 4 MG tablet Take 1 tablet (4 mg total) by mouth every 6 (six) hours as needed for nausea. 20 tablet 0   ranolazine (RANEXA) 500 MG 12 hr tablet Take 500 mg by mouth 2 (two) times daily; breakfast, Afternoon (dinner) 180 tablet 1   hydrALAZINE (APRESOLINE) 50 MG tablet Take 100 mg by mouth in the morning and at bedtime.     No current facility-administered medications on file prior to visit.    There are no Patient Instructions on file for this visit. No follow-ups on  file.   Kris Hartmann, NP

## 2022-01-16 DIAGNOSIS — Z992 Dependence on renal dialysis: Secondary | ICD-10-CM | POA: Diagnosis not present

## 2022-01-16 DIAGNOSIS — N186 End stage renal disease: Secondary | ICD-10-CM | POA: Diagnosis not present

## 2022-01-16 DIAGNOSIS — N2581 Secondary hyperparathyroidism of renal origin: Secondary | ICD-10-CM | POA: Diagnosis not present

## 2022-01-18 DIAGNOSIS — N186 End stage renal disease: Secondary | ICD-10-CM | POA: Diagnosis not present

## 2022-01-18 DIAGNOSIS — N2581 Secondary hyperparathyroidism of renal origin: Secondary | ICD-10-CM | POA: Diagnosis not present

## 2022-01-18 DIAGNOSIS — Z992 Dependence on renal dialysis: Secondary | ICD-10-CM | POA: Diagnosis not present

## 2022-01-20 DIAGNOSIS — Z992 Dependence on renal dialysis: Secondary | ICD-10-CM | POA: Diagnosis not present

## 2022-01-20 DIAGNOSIS — N186 End stage renal disease: Secondary | ICD-10-CM | POA: Diagnosis not present

## 2022-01-20 DIAGNOSIS — N2581 Secondary hyperparathyroidism of renal origin: Secondary | ICD-10-CM | POA: Diagnosis not present

## 2022-01-23 DIAGNOSIS — Z992 Dependence on renal dialysis: Secondary | ICD-10-CM | POA: Diagnosis not present

## 2022-01-23 DIAGNOSIS — N2581 Secondary hyperparathyroidism of renal origin: Secondary | ICD-10-CM | POA: Diagnosis not present

## 2022-01-23 DIAGNOSIS — N186 End stage renal disease: Secondary | ICD-10-CM | POA: Diagnosis not present

## 2022-01-25 DIAGNOSIS — Z992 Dependence on renal dialysis: Secondary | ICD-10-CM | POA: Diagnosis not present

## 2022-01-25 DIAGNOSIS — N186 End stage renal disease: Secondary | ICD-10-CM | POA: Diagnosis not present

## 2022-01-25 DIAGNOSIS — N2581 Secondary hyperparathyroidism of renal origin: Secondary | ICD-10-CM | POA: Diagnosis not present

## 2022-01-27 DIAGNOSIS — N2581 Secondary hyperparathyroidism of renal origin: Secondary | ICD-10-CM | POA: Diagnosis not present

## 2022-01-27 DIAGNOSIS — N186 End stage renal disease: Secondary | ICD-10-CM | POA: Diagnosis not present

## 2022-01-27 DIAGNOSIS — Z992 Dependence on renal dialysis: Secondary | ICD-10-CM | POA: Diagnosis not present

## 2022-01-30 DIAGNOSIS — Z992 Dependence on renal dialysis: Secondary | ICD-10-CM | POA: Diagnosis not present

## 2022-01-30 DIAGNOSIS — N2581 Secondary hyperparathyroidism of renal origin: Secondary | ICD-10-CM | POA: Diagnosis not present

## 2022-01-30 DIAGNOSIS — N186 End stage renal disease: Secondary | ICD-10-CM | POA: Diagnosis not present

## 2022-02-01 DIAGNOSIS — N2581 Secondary hyperparathyroidism of renal origin: Secondary | ICD-10-CM | POA: Diagnosis not present

## 2022-02-01 DIAGNOSIS — Z992 Dependence on renal dialysis: Secondary | ICD-10-CM | POA: Diagnosis not present

## 2022-02-01 DIAGNOSIS — N186 End stage renal disease: Secondary | ICD-10-CM | POA: Diagnosis not present

## 2022-02-03 DIAGNOSIS — N186 End stage renal disease: Secondary | ICD-10-CM | POA: Diagnosis not present

## 2022-02-03 DIAGNOSIS — Z992 Dependence on renal dialysis: Secondary | ICD-10-CM | POA: Diagnosis not present

## 2022-02-03 DIAGNOSIS — N2581 Secondary hyperparathyroidism of renal origin: Secondary | ICD-10-CM | POA: Diagnosis not present

## 2022-02-06 DIAGNOSIS — N186 End stage renal disease: Secondary | ICD-10-CM | POA: Diagnosis not present

## 2022-02-06 DIAGNOSIS — N2581 Secondary hyperparathyroidism of renal origin: Secondary | ICD-10-CM | POA: Diagnosis not present

## 2022-02-06 DIAGNOSIS — Z992 Dependence on renal dialysis: Secondary | ICD-10-CM | POA: Diagnosis not present

## 2022-02-07 DIAGNOSIS — N186 End stage renal disease: Secondary | ICD-10-CM | POA: Diagnosis not present

## 2022-02-07 DIAGNOSIS — Z992 Dependence on renal dialysis: Secondary | ICD-10-CM | POA: Diagnosis not present

## 2022-02-08 DIAGNOSIS — N186 End stage renal disease: Secondary | ICD-10-CM | POA: Diagnosis not present

## 2022-02-08 DIAGNOSIS — Z992 Dependence on renal dialysis: Secondary | ICD-10-CM | POA: Diagnosis not present

## 2022-02-08 DIAGNOSIS — N2581 Secondary hyperparathyroidism of renal origin: Secondary | ICD-10-CM | POA: Diagnosis not present

## 2022-02-10 DIAGNOSIS — N186 End stage renal disease: Secondary | ICD-10-CM | POA: Diagnosis not present

## 2022-02-10 DIAGNOSIS — N2581 Secondary hyperparathyroidism of renal origin: Secondary | ICD-10-CM | POA: Diagnosis not present

## 2022-02-10 DIAGNOSIS — Z992 Dependence on renal dialysis: Secondary | ICD-10-CM | POA: Diagnosis not present

## 2022-02-13 DIAGNOSIS — N2581 Secondary hyperparathyroidism of renal origin: Secondary | ICD-10-CM | POA: Diagnosis not present

## 2022-02-13 DIAGNOSIS — N186 End stage renal disease: Secondary | ICD-10-CM | POA: Diagnosis not present

## 2022-02-13 DIAGNOSIS — Z992 Dependence on renal dialysis: Secondary | ICD-10-CM | POA: Diagnosis not present

## 2022-02-15 DIAGNOSIS — Z992 Dependence on renal dialysis: Secondary | ICD-10-CM | POA: Diagnosis not present

## 2022-02-15 DIAGNOSIS — N2581 Secondary hyperparathyroidism of renal origin: Secondary | ICD-10-CM | POA: Diagnosis not present

## 2022-02-15 DIAGNOSIS — N186 End stage renal disease: Secondary | ICD-10-CM | POA: Diagnosis not present

## 2022-02-17 DIAGNOSIS — Z992 Dependence on renal dialysis: Secondary | ICD-10-CM | POA: Diagnosis not present

## 2022-02-17 DIAGNOSIS — N186 End stage renal disease: Secondary | ICD-10-CM | POA: Diagnosis not present

## 2022-02-17 DIAGNOSIS — N2581 Secondary hyperparathyroidism of renal origin: Secondary | ICD-10-CM | POA: Diagnosis not present

## 2022-02-20 DIAGNOSIS — N2581 Secondary hyperparathyroidism of renal origin: Secondary | ICD-10-CM | POA: Diagnosis not present

## 2022-02-20 DIAGNOSIS — Z992 Dependence on renal dialysis: Secondary | ICD-10-CM | POA: Diagnosis not present

## 2022-02-20 DIAGNOSIS — N186 End stage renal disease: Secondary | ICD-10-CM | POA: Diagnosis not present

## 2022-02-22 DIAGNOSIS — Z992 Dependence on renal dialysis: Secondary | ICD-10-CM | POA: Diagnosis not present

## 2022-02-22 DIAGNOSIS — N2581 Secondary hyperparathyroidism of renal origin: Secondary | ICD-10-CM | POA: Diagnosis not present

## 2022-02-22 DIAGNOSIS — N186 End stage renal disease: Secondary | ICD-10-CM | POA: Diagnosis not present

## 2022-02-24 DIAGNOSIS — N186 End stage renal disease: Secondary | ICD-10-CM | POA: Diagnosis not present

## 2022-02-24 DIAGNOSIS — Z992 Dependence on renal dialysis: Secondary | ICD-10-CM | POA: Diagnosis not present

## 2022-02-24 DIAGNOSIS — N2581 Secondary hyperparathyroidism of renal origin: Secondary | ICD-10-CM | POA: Diagnosis not present

## 2022-02-27 DIAGNOSIS — N186 End stage renal disease: Secondary | ICD-10-CM | POA: Diagnosis not present

## 2022-02-27 DIAGNOSIS — N2581 Secondary hyperparathyroidism of renal origin: Secondary | ICD-10-CM | POA: Diagnosis not present

## 2022-02-27 DIAGNOSIS — Z992 Dependence on renal dialysis: Secondary | ICD-10-CM | POA: Diagnosis not present

## 2022-03-01 DIAGNOSIS — N2581 Secondary hyperparathyroidism of renal origin: Secondary | ICD-10-CM | POA: Diagnosis not present

## 2022-03-01 DIAGNOSIS — N186 End stage renal disease: Secondary | ICD-10-CM | POA: Diagnosis not present

## 2022-03-01 DIAGNOSIS — Z992 Dependence on renal dialysis: Secondary | ICD-10-CM | POA: Diagnosis not present

## 2022-03-03 DIAGNOSIS — N2581 Secondary hyperparathyroidism of renal origin: Secondary | ICD-10-CM | POA: Diagnosis not present

## 2022-03-03 DIAGNOSIS — N186 End stage renal disease: Secondary | ICD-10-CM | POA: Diagnosis not present

## 2022-03-03 DIAGNOSIS — Z992 Dependence on renal dialysis: Secondary | ICD-10-CM | POA: Diagnosis not present

## 2022-03-06 DIAGNOSIS — N2581 Secondary hyperparathyroidism of renal origin: Secondary | ICD-10-CM | POA: Diagnosis not present

## 2022-03-06 DIAGNOSIS — Z992 Dependence on renal dialysis: Secondary | ICD-10-CM | POA: Diagnosis not present

## 2022-03-06 DIAGNOSIS — N186 End stage renal disease: Secondary | ICD-10-CM | POA: Diagnosis not present

## 2022-03-08 DIAGNOSIS — N186 End stage renal disease: Secondary | ICD-10-CM | POA: Diagnosis not present

## 2022-03-08 DIAGNOSIS — Z992 Dependence on renal dialysis: Secondary | ICD-10-CM | POA: Diagnosis not present

## 2022-03-08 DIAGNOSIS — N2581 Secondary hyperparathyroidism of renal origin: Secondary | ICD-10-CM | POA: Diagnosis not present

## 2022-03-10 DIAGNOSIS — N2581 Secondary hyperparathyroidism of renal origin: Secondary | ICD-10-CM | POA: Diagnosis not present

## 2022-03-10 DIAGNOSIS — Z992 Dependence on renal dialysis: Secondary | ICD-10-CM | POA: Diagnosis not present

## 2022-03-10 DIAGNOSIS — N186 End stage renal disease: Secondary | ICD-10-CM | POA: Diagnosis not present

## 2022-03-13 DIAGNOSIS — N2581 Secondary hyperparathyroidism of renal origin: Secondary | ICD-10-CM | POA: Diagnosis not present

## 2022-03-13 DIAGNOSIS — N186 End stage renal disease: Secondary | ICD-10-CM | POA: Diagnosis not present

## 2022-03-13 DIAGNOSIS — Z992 Dependence on renal dialysis: Secondary | ICD-10-CM | POA: Diagnosis not present

## 2022-03-15 DIAGNOSIS — N2581 Secondary hyperparathyroidism of renal origin: Secondary | ICD-10-CM | POA: Diagnosis not present

## 2022-03-15 DIAGNOSIS — Z992 Dependence on renal dialysis: Secondary | ICD-10-CM | POA: Diagnosis not present

## 2022-03-15 DIAGNOSIS — N186 End stage renal disease: Secondary | ICD-10-CM | POA: Diagnosis not present

## 2022-03-17 DIAGNOSIS — N2581 Secondary hyperparathyroidism of renal origin: Secondary | ICD-10-CM | POA: Diagnosis not present

## 2022-03-17 DIAGNOSIS — N186 End stage renal disease: Secondary | ICD-10-CM | POA: Diagnosis not present

## 2022-03-17 DIAGNOSIS — Z992 Dependence on renal dialysis: Secondary | ICD-10-CM | POA: Diagnosis not present

## 2022-03-19 ENCOUNTER — Telehealth: Payer: Self-pay | Admitting: Internal Medicine

## 2022-03-19 NOTE — Telephone Encounter (Signed)
Called patient to confirm we are still her pcp. Husband confirmed. Stated he will have daughter call tomorrow to schedule appointment-Toni

## 2022-03-20 DIAGNOSIS — N2581 Secondary hyperparathyroidism of renal origin: Secondary | ICD-10-CM | POA: Diagnosis not present

## 2022-03-20 DIAGNOSIS — Z992 Dependence on renal dialysis: Secondary | ICD-10-CM | POA: Diagnosis not present

## 2022-03-20 DIAGNOSIS — N186 End stage renal disease: Secondary | ICD-10-CM | POA: Diagnosis not present

## 2022-03-22 DIAGNOSIS — N186 End stage renal disease: Secondary | ICD-10-CM | POA: Diagnosis not present

## 2022-03-22 DIAGNOSIS — Z992 Dependence on renal dialysis: Secondary | ICD-10-CM | POA: Diagnosis not present

## 2022-03-22 DIAGNOSIS — N2581 Secondary hyperparathyroidism of renal origin: Secondary | ICD-10-CM | POA: Diagnosis not present

## 2022-03-24 DIAGNOSIS — N186 End stage renal disease: Secondary | ICD-10-CM | POA: Diagnosis not present

## 2022-03-24 DIAGNOSIS — N2581 Secondary hyperparathyroidism of renal origin: Secondary | ICD-10-CM | POA: Diagnosis not present

## 2022-03-24 DIAGNOSIS — Z992 Dependence on renal dialysis: Secondary | ICD-10-CM | POA: Diagnosis not present

## 2022-03-27 ENCOUNTER — Other Ambulatory Visit
Admission: RE | Admit: 2022-03-27 | Discharge: 2022-03-27 | Disposition: A | Discharge status: Home / Self Care | Payer: Medicare HMO | Source: Ambulatory Visit | Attending: Internal Medicine | Admitting: Internal Medicine

## 2022-03-27 DIAGNOSIS — D649 Anemia, unspecified: Secondary | ICD-10-CM | POA: Diagnosis not present

## 2022-03-27 DIAGNOSIS — Z992 Dependence on renal dialysis: Secondary | ICD-10-CM | POA: Diagnosis not present

## 2022-03-27 DIAGNOSIS — N186 End stage renal disease: Secondary | ICD-10-CM | POA: Diagnosis not present

## 2022-03-27 DIAGNOSIS — N2581 Secondary hyperparathyroidism of renal origin: Secondary | ICD-10-CM | POA: Diagnosis not present

## 2022-03-27 LAB — HEMOGLOBIN AND HEMATOCRIT, BLOOD
HCT: 26 % — ABNORMAL LOW (ref 36.0–46.0)
Hemoglobin: 7.9 g/dL — ABNORMAL LOW (ref 12.0–15.0)

## 2022-03-28 DIAGNOSIS — E113293 Type 2 diabetes mellitus with mild nonproliferative diabetic retinopathy without macular edema, bilateral: Secondary | ICD-10-CM | POA: Diagnosis not present

## 2022-03-28 DIAGNOSIS — E113291 Type 2 diabetes mellitus with mild nonproliferative diabetic retinopathy without macular edema, right eye: Secondary | ICD-10-CM | POA: Diagnosis not present

## 2022-03-28 DIAGNOSIS — H04123 Dry eye syndrome of bilateral lacrimal glands: Secondary | ICD-10-CM | POA: Diagnosis not present

## 2022-03-28 DIAGNOSIS — E113292 Type 2 diabetes mellitus with mild nonproliferative diabetic retinopathy without macular edema, left eye: Secondary | ICD-10-CM | POA: Diagnosis not present

## 2022-03-29 DIAGNOSIS — N186 End stage renal disease: Secondary | ICD-10-CM | POA: Diagnosis not present

## 2022-03-29 DIAGNOSIS — N2581 Secondary hyperparathyroidism of renal origin: Secondary | ICD-10-CM | POA: Diagnosis not present

## 2022-03-29 DIAGNOSIS — Z992 Dependence on renal dialysis: Secondary | ICD-10-CM | POA: Diagnosis not present

## 2022-03-30 ENCOUNTER — Encounter: Payer: Self-pay | Admitting: Physician Assistant

## 2022-03-30 ENCOUNTER — Ambulatory Visit (INDEPENDENT_AMBULATORY_CARE_PROVIDER_SITE_OTHER): Payer: Medicare HMO | Admitting: Physician Assistant

## 2022-03-30 VITALS — BP 148/58 | HR 60 | Temp 97.9°F | Resp 16 | Ht 67.0 in | Wt 188.0 lb

## 2022-03-30 DIAGNOSIS — I10 Essential (primary) hypertension: Secondary | ICD-10-CM

## 2022-03-30 DIAGNOSIS — N186 End stage renal disease: Secondary | ICD-10-CM | POA: Diagnosis not present

## 2022-03-30 DIAGNOSIS — E1122 Type 2 diabetes mellitus with diabetic chronic kidney disease: Secondary | ICD-10-CM | POA: Diagnosis not present

## 2022-03-30 DIAGNOSIS — Z794 Long term (current) use of insulin: Secondary | ICD-10-CM | POA: Diagnosis not present

## 2022-03-30 DIAGNOSIS — Z992 Dependence on renal dialysis: Secondary | ICD-10-CM | POA: Diagnosis not present

## 2022-03-30 LAB — POCT GLYCOSYLATED HEMOGLOBIN (HGB A1C): Hemoglobin A1C: 5.1 % (ref 4.0–5.6)

## 2022-03-30 NOTE — Progress Notes (Signed)
[facility] [place], [place] [address]  Internal MEDICINE  Office Visit Note  Patient Name: Jennifer Carrillo  [id number]  [id number]  Date of Service: 04/04/2022  Chief Complaint  Patient presents with   Follow-up   Diabetes   Gastroesophageal Reflux   Hyperlipidemia   Hypertension   Quality Metric Gaps    Shingles, TDAP, Pneumonia, Foot Exam, AWV    HPI Pt is here for routine follow up with her husband -She had still been taking 4 units of insulin daily, however A1c has been normal and will go ahead stop this unless elevated readings again. Advised to check sugar if not feeling well -She is going to dialysis tues, thurs, sat -BP at home checked occasionally and will check more often as it is a little elevated in office. She is also followed by cardiology with similar readings and was determined to not need medication adjustment at the time -Will follow up with podiatry  Current Medication: Outpatient Encounter Medications as of 03/30/2022  Medication Sig   ACCU-CHEK GUIDE test strip TEST BLOOD SUGAR THREE TIMES DAILY  AS  DIRECTED   Accu-Chek Softclix Lancets lancets USE AS INSTRUCTED 3 TIMES A DAY   acetaminophen (TYLENOL) 500 MG tablet Take 1 tablet (500 mg total) by mouth every 6 (six) hours as needed for moderate pain or headache.   Alcohol Swabs (B-D SINGLE USE SWABS REGULAR) PADS Use as directed for 3 times daily DX E11.65   amLODipine (NORVASC) 10 MG tablet TAKE 1 TABLET EVERY DAY AT BREAKFAST   anastrozole (ARIMIDEX) 1 MG tablet TAKE 1 TABLET EVERY DAY IN THE AFTERNOON (DINNER)   Blood Glucose Monitoring Suppl (ACCU-CHEK GUIDE ME) w/Device KIT Use as instructed. DX e11.65   cholecalciferol (VITAMIN D) 25 MCG (1000 UNIT) tablet TAKE 1 TABLET EVERY MONDAY, WEDNESDAY, AND FRIDAY.   clopidogrel (PLAVIX) 75 MG tablet TAKE 1 TABLET DAILY AT DINNER   docusate sodium (COLACE) 100 MG capsule Take 200 mg by mouth at bedtime as needed for mild  constipation.   donepezil (ARICEPT) 10 MG tablet TAKE 1 TABLET AT BEDTIME FOR MEMORY (NEED MD APPOINTMENT)   fluticasone-salmeterol (ADVAIR) 100-50 MCG/ACT AEPB Inhale 1 puff into the lungs 2 (two) times daily.   insulin NPH-regular Human (70-30) 100 UNIT/ML injection Inject 12 Units into the skin 2 (two) times daily. Inject 20 units subcutaneously in the morning and 20 units in the evening. (Patient taking differently: Inject into the skin daily. Use as directed 1-2 units as needed if glucose is fasting glucose 150)   isosorbide mononitrate (IMDUR) 60 MG 24 hr tablet Take 60 mg by mouth daily.   ondansetron (ZOFRAN) 4 MG tablet Take 1 tablet (4 mg total) by mouth every 6 (six) hours as needed for nausea.   ranolazine (RANEXA) 500 MG 12 hr tablet Take 500 mg by mouth 2 (two) times daily; breakfast, Afternoon (dinner)   hydrALAZINE (APRESOLINE) 50 MG tablet Take 100 mg by mouth in the morning and at bedtime.   No facility-administered encounter medications on file as of 03/30/2022.    Surgical History: Past Surgical History:  Procedure Laterality Date   BREAST SURGERY Left    left lumpectomy   CAROTID ANGIOGRAPHY Left 06/25/2018   Procedure: CAROTID ANGIOGRAPHY, possible intervention;  Surgeon: Katha Cabal, MD;  Location: [facility];  Service: Cardiovascular;  Laterality: Left;   CAROTID ENDARTERECTOMY Right    CAROTID PTA/STENT INTERVENTION Left 06/25/2018   Procedure: CAROTID PTA/STENT INTERVENTION;  Surgeon: Katha Cabal, MD;  Location: [place] [facility];  Service: Cardiovascular;  Laterality: Left;   CATARACT EXTRACTION Right 2013   CORONARY STENT PLACEMENT     L. L. E.   DIALYSIS/PERMA CATHETER INSERTION N/A 12/29/2020   Procedure: DIALYSIS/PERMA CATHETER INSERTION;  Surgeon: Algernon Huxley, MD;  Location: [facility];  Service: Cardiovascular;  Laterality: N/A;   DIALYSIS/PERMA CATHETER INSERTION N/A 12/14/2021   Procedure: DIALYSIS/PERMA CATHETER  INSERTION;  Surgeon: Katha Cabal, MD;  Location: [facility];  Service: Cardiovascular;  Laterality: N/A;   EYE SURGERY Bilateral    cataract   LEFT HEART CATH AND CORONARY ANGIOGRAPHY Left 05/06/2018   Procedure: LEFT HEART CATH AND CORONARY ANGIOGRAPHY;  Surgeon: Corey Skains, MD;  Location: [facility];  Service: Cardiovascular;  Laterality: Left;   LOWER EXTREMITY ANGIOGRAPHY Right 01/22/2018   Procedure: LOWER EXTREMITY ANGIOGRAPHY;  Surgeon: Katha Cabal, MD;  Location: [facility];  Service: Cardiovascular;  Laterality: Right;   RENAL ANGIOGRAPHY Left 02/26/2018   Procedure: RENAL ANGIOGRAPHY;  Surgeon: Katha Cabal, MD;  Location: [facility];  Service: Cardiovascular;  Laterality: Left;   STENT PLACEMENT VASCULAR ([facility]) Left    stent placement on LLE   TUBAL LIGATION      Medical History: Past Medical History:  Diagnosis Date   Arthritis    Asthma    Calf pain    Cancer ([place]) 2016   left breast   COPD (chronic obstructive pulmonary disease) (HCC)    Diabetes (HCC)    Discoid lupus    Gastro-esophageal reflux    Glaucoma    Hyperlipemia    Hypertension    Iron deficiency anemia    Joint pain    Leg swelling    Osteoarthritis    PVD (peripheral vascular disease) (HCC)    Sinus problem    Sleep apnea    No CPAP/ Can't tolerate   Stroke ([place]) 1987    Family History: Family History  Problem Relation Age of Onset   Heart disease Mother    Diabetes Sister    Leukemia Daughter    Lung cancer Brother    Diabetes Other    Hypertension Other    Bladder Cancer Neg Hx    Kidney disease Neg Hx    Prostate cancer Neg Hx    Breast cancer Neg Hx     Social History   Socioeconomic History   Marital status: Married    Spouse name: Not on file   Number of children: Not on file   Years of education: Not on file   Highest education level: Not on file  Occupational History   Not on file  Tobacco Use    Smoking status: Former    Types: Cigarettes    Quit date: 1987    Years since quitting: 37.2   Smokeless tobacco: Never   Tobacco comments:    quit 31 years  Vaping Use   Vaping Use: Never used  Substance and Sexual Activity   Alcohol use: No   Drug use: No   Sexual activity: Not on file  Other Topics Concern   Not on file  Social History Narrative   Walks with a rolling walker; remote hx of smoking; no alcohol; [place]- with husband/son. Daughter lives in [place].    Social Determinants of Health   Financial Resource Strain: Low Risk  (06/23/2020)   Overall Financial Resource Strain (CARDIA)  Difficulty of Paying Living Expenses: Not very hard  Food Insecurity: No Food Insecurity (01/06/2021)   Hunger Vital Sign    Worried About Running Out of Food in the Last Year: Never true    Ran Out of Food in the Last Year: Never true  Transportation Needs: No Transportation Needs (01/06/2021)   PRAPARE - Hydrologist (Medical): No    Lack of Transportation (Non-Medical): No  Physical Activity: Not on file  Stress: Not on file  Social Connections: Not on file  Intimate Partner Violence: Not on file      Review of Systems  Constitutional:  Negative for chills, fatigue and unexpected weight change.  HENT:  Negative for congestion, postnasal drip, rhinorrhea, sneezing and sore throat.   Eyes:  Negative for redness.  Respiratory:  Negative for cough, chest tightness and shortness of breath.   Cardiovascular:  Negative for chest pain and palpitations.  Gastrointestinal:  Negative for abdominal pain, constipation, diarrhea, nausea and vomiting.  Genitourinary:  Negative for dysuria and frequency.  Musculoskeletal:  Positive for arthralgias. Negative for back pain, joint swelling and neck pain.  Skin:  Negative for rash.  Neurological: Negative.  Negative for tremors and numbness.  Hematological:  Negative for adenopathy. Does not bruise/bleed easily.   Psychiatric/Behavioral:  Negative for behavioral problems (Depression), sleep disturbance and suicidal ideas. The patient is not nervous/anxious.     Vital Signs: BP (!) 148/58 Comment: 151/58  Pulse 60   Temp 97.9 F (36.6 C)   Resp 16   Ht 5\' 7"  (1.702 m)   Wt 188 lb (85.3 kg)   SpO2 99%   BMI 29.44 kg/m    Physical Exam Constitutional:      General: She is not in acute distress.    Appearance: She is well-developed. She is not diaphoretic.  HENT:     Head: Normocephalic and atraumatic.     Mouth/Throat:     Pharynx: No oropharyngeal exudate.  Eyes:     Pupils: Pupils are equal, round, and reactive to light.  Neck:     Thyroid: No thyromegaly.     Vascular: No JVD.     Trachea: No tracheal deviation.  Cardiovascular:     Rate and Rhythm: Normal rate and regular rhythm.     Heart sounds: Normal heart sounds. No murmur heard.    No friction rub. No gallop.  Pulmonary:     Effort: Pulmonary effort is normal. No respiratory distress.     Breath sounds: No wheezing or rales.  Chest:     Chest wall: No tenderness.  Abdominal:     General: Bowel sounds are normal.     Palpations: Abdomen is soft.  Musculoskeletal:        General: Normal range of motion.     Cervical back: Normal range of motion and neck supple.  Lymphadenopathy:     Cervical: No cervical adenopathy.  Skin:    General: Skin is warm and dry.  Neurological:     Mental Status: She is alert and oriented to person, place, and time.     Cranial Nerves: No cranial nerve deficit.  Psychiatric:        Behavior: Behavior normal.        Thought Content: Thought content normal.        Judgment: Judgment normal.        Assessment/Plan: 1. Primary hypertension Mildly elevated in office though stable from cardiology visit and will have her  monitor and continue current medications  2. Type 2 diabetes mellitus with chronic kidney disease on chronic dialysis, with long-term current use of insulin (HCC) -  POCT HgB A1C is 5.1 and has been in normal range and will d/c insulin unless sugars rising again. Advised to continue monitor sugars periodically, especially if not feeling well  3. ESRD (end stage renal disease) ([place]) Followed by nephrology   General Counseling: Tyrone Nine understanding of the findings of todays visit and agrees with plan of treatment. I have discussed any further diagnostic evaluation that may be needed or ordered today. We also reviewed her medications today. she has been encouraged to call the office with any questions or concerns that should arise related to todays visit.    Orders Placed This Encounter  Procedures   POCT HgB A1C    No orders of the defined types were placed in this encounter.   This patient was seen by Drema Dallas, PA-C in collaboration with Dr. Clayborn Bigness as a part of collaborative care agreement.   Total time spent:30 Minutes Time spent includes review of chart, medications, test results, and follow up plan with the patient.      Dr [name] Internal medicine

## 2022-03-31 DIAGNOSIS — N186 End stage renal disease: Secondary | ICD-10-CM | POA: Diagnosis not present

## 2022-03-31 DIAGNOSIS — Z992 Dependence on renal dialysis: Secondary | ICD-10-CM | POA: Diagnosis not present

## 2022-03-31 DIAGNOSIS — N2581 Secondary hyperparathyroidism of renal origin: Secondary | ICD-10-CM | POA: Diagnosis not present

## 2022-04-03 DIAGNOSIS — N2581 Secondary hyperparathyroidism of renal origin: Secondary | ICD-10-CM | POA: Diagnosis not present

## 2022-04-03 DIAGNOSIS — N186 End stage renal disease: Secondary | ICD-10-CM | POA: Diagnosis not present

## 2022-04-03 DIAGNOSIS — Z992 Dependence on renal dialysis: Secondary | ICD-10-CM | POA: Diagnosis not present

## 2022-04-03 DIAGNOSIS — Z794 Long term (current) use of insulin: Secondary | ICD-10-CM | POA: Diagnosis not present

## 2022-04-03 DIAGNOSIS — E119 Type 2 diabetes mellitus without complications: Secondary | ICD-10-CM | POA: Diagnosis not present

## 2022-04-05 DIAGNOSIS — N2581 Secondary hyperparathyroidism of renal origin: Secondary | ICD-10-CM | POA: Diagnosis not present

## 2022-04-05 DIAGNOSIS — N186 End stage renal disease: Secondary | ICD-10-CM | POA: Diagnosis not present

## 2022-04-05 DIAGNOSIS — Z992 Dependence on renal dialysis: Secondary | ICD-10-CM | POA: Diagnosis not present

## 2022-04-07 DIAGNOSIS — Z992 Dependence on renal dialysis: Secondary | ICD-10-CM | POA: Diagnosis not present

## 2022-04-07 DIAGNOSIS — N186 End stage renal disease: Secondary | ICD-10-CM | POA: Diagnosis not present

## 2022-04-07 DIAGNOSIS — N2581 Secondary hyperparathyroidism of renal origin: Secondary | ICD-10-CM | POA: Diagnosis not present

## 2022-04-08 DIAGNOSIS — Z992 Dependence on renal dialysis: Secondary | ICD-10-CM | POA: Diagnosis not present

## 2022-04-08 DIAGNOSIS — N186 End stage renal disease: Secondary | ICD-10-CM | POA: Diagnosis not present

## 2022-04-10 DIAGNOSIS — Z992 Dependence on renal dialysis: Secondary | ICD-10-CM | POA: Diagnosis not present

## 2022-04-10 DIAGNOSIS — N186 End stage renal disease: Secondary | ICD-10-CM | POA: Diagnosis not present

## 2022-04-10 DIAGNOSIS — N2581 Secondary hyperparathyroidism of renal origin: Secondary | ICD-10-CM | POA: Diagnosis not present

## 2022-04-12 DIAGNOSIS — N186 End stage renal disease: Secondary | ICD-10-CM | POA: Diagnosis not present

## 2022-04-12 DIAGNOSIS — Z992 Dependence on renal dialysis: Secondary | ICD-10-CM | POA: Diagnosis not present

## 2022-04-12 DIAGNOSIS — N2581 Secondary hyperparathyroidism of renal origin: Secondary | ICD-10-CM | POA: Diagnosis not present

## 2022-04-14 DIAGNOSIS — N186 End stage renal disease: Secondary | ICD-10-CM | POA: Diagnosis not present

## 2022-04-14 DIAGNOSIS — N2581 Secondary hyperparathyroidism of renal origin: Secondary | ICD-10-CM | POA: Diagnosis not present

## 2022-04-14 DIAGNOSIS — Z992 Dependence on renal dialysis: Secondary | ICD-10-CM | POA: Diagnosis not present

## 2022-04-17 DIAGNOSIS — N2581 Secondary hyperparathyroidism of renal origin: Secondary | ICD-10-CM | POA: Diagnosis not present

## 2022-04-17 DIAGNOSIS — Z992 Dependence on renal dialysis: Secondary | ICD-10-CM | POA: Diagnosis not present

## 2022-04-17 DIAGNOSIS — N186 End stage renal disease: Secondary | ICD-10-CM | POA: Diagnosis not present

## 2022-04-19 DIAGNOSIS — N2581 Secondary hyperparathyroidism of renal origin: Secondary | ICD-10-CM | POA: Diagnosis not present

## 2022-04-19 DIAGNOSIS — N186 End stage renal disease: Secondary | ICD-10-CM | POA: Diagnosis not present

## 2022-04-19 DIAGNOSIS — Z992 Dependence on renal dialysis: Secondary | ICD-10-CM | POA: Diagnosis not present

## 2022-04-20 ENCOUNTER — Encounter: Payer: Self-pay | Admitting: Physician Assistant

## 2022-04-20 ENCOUNTER — Ambulatory Visit (INDEPENDENT_AMBULATORY_CARE_PROVIDER_SITE_OTHER): Payer: Medicare HMO | Admitting: Physician Assistant

## 2022-04-20 VITALS — BP 168/73 | HR 81 | Temp 97.8°F | Resp 16 | Ht 67.0 in | Wt 178.0 lb

## 2022-04-20 DIAGNOSIS — I10 Essential (primary) hypertension: Secondary | ICD-10-CM | POA: Diagnosis not present

## 2022-04-20 DIAGNOSIS — T887XXA Unspecified adverse effect of drug or medicament, initial encounter: Secondary | ICD-10-CM

## 2022-04-20 NOTE — Progress Notes (Signed)
[facility] [address] [place], [place] [address]  Internal MEDICINE  Office Visit Note  Patient Name: Jennifer Carrillo  [id number]  [id number]  Date of Service: 05/01/2022  Chief Complaint  Patient presents with   Follow-up    Joint pain    Hypertension   Diabetes    HPI Pt is here for routine follow up -noticed that leg pains went away when she did not take her meds one day (missed morning and evening) -AM: hydralazine, amlodipine and isosorbide -PM: clopidogrel, anastrozole,  atorvastatin, then at bedtime hydralazine, donepezil -Has not taken BP meds yet this morning and will do so now -Likely atorvastatin causing leg pains, this was off her med list with our office (added back now), but she has been taking from cardiology and is unsure how long she was off before going back to higher dose. May be able to lower dose to see if this helps reduce side effects. They are going to contact cardiology about options. Her cardiologist retired and has not been back to see another provider at their office since then. -has been doing well without any insulin  Current Medication: Outpatient Encounter Medications as of 04/20/2022  Medication Sig   ACCU-CHEK GUIDE test strip TEST BLOOD SUGAR THREE TIMES DAILY  AS  DIRECTED   Accu-Chek Softclix Lancets lancets USE AS INSTRUCTED 3 TIMES A DAY   acetaminophen (TYLENOL) 500 MG tablet Take 1 tablet (500 mg total) by mouth every 6 (six) hours as needed for moderate pain or headache.   Alcohol Swabs (B-D SINGLE USE SWABS REGULAR) PADS Use as directed for 3 times daily DX E11.65   amLODipine (NORVASC) 10 MG tablet TAKE 1 TABLET EVERY DAY AT BREAKFAST   anastrozole (ARIMIDEX) 1 MG tablet TAKE 1 TABLET EVERY DAY IN THE AFTERNOON (DINNER)   atorvastatin (LIPITOR) 40 MG tablet Take 40 mg by mouth daily.   Blood Glucose Monitoring Suppl (ACCU-CHEK GUIDE ME) w/Device KIT Use as instructed. DX e11.65   cholecalciferol (VITAMIN D) 25 MCG  (1000 UNIT) tablet TAKE 1 TABLET EVERY MONDAY, WEDNESDAY, AND FRIDAY.   clopidogrel (PLAVIX) 75 MG tablet TAKE 1 TABLET DAILY AT DINNER   docusate sodium (COLACE) 100 MG capsule Take 200 mg by mouth at bedtime as needed for mild constipation.   donepezil (ARICEPT) 10 MG tablet TAKE 1 TABLET AT BEDTIME FOR MEMORY (NEED MD APPOINTMENT)   fluticasone-salmeterol (ADVAIR) 100-50 MCG/ACT AEPB Inhale 1 puff into the lungs 2 (two) times daily.   insulin NPH-regular Human (70-30) 100 UNIT/ML injection Inject 12 Units into the skin 2 (two) times daily. Inject 20 units subcutaneously in the morning and 20 units in the evening. (Patient taking differently: Inject into the skin daily. Use as directed 1-2 units as needed if glucose is fasting glucose 150)   isosorbide mononitrate (IMDUR) 60 MG 24 hr tablet Take 60 mg by mouth daily.   ondansetron (ZOFRAN) 4 MG tablet Take 1 tablet (4 mg total) by mouth every 6 (six) hours as needed for nausea.   [DISCONTINUED] ranolazine (RANEXA) 500 MG 12 hr tablet Take 500 mg by mouth 2 (two) times daily; breakfast, Afternoon (dinner)   hydrALAZINE (APRESOLINE) 50 MG tablet Take 100 mg by mouth in the morning and at bedtime.   No facility-administered encounter medications on file as of 04/20/2022.    Surgical History: Past Surgical History:  Procedure Laterality Date   BREAST SURGERY Left    left lumpectomy   CAROTID ANGIOGRAPHY Left 06/25/2018  Procedure: CAROTID ANGIOGRAPHY, possible intervention;  Surgeon: Renford Dills, MD;  Location: [facility];  Service: Cardiovascular;  Laterality: Left;   CAROTID ENDARTERECTOMY Right    CAROTID PTA/STENT INTERVENTION Left 06/25/2018   Procedure: CAROTID PTA/STENT INTERVENTION;  Surgeon: Renford Dills, MD;  Location: [facility];  Service: Cardiovascular;  Laterality: Left;   CATARACT EXTRACTION Right 2013   CORONARY STENT PLACEMENT     L. L. E.   DIALYSIS/PERMA CATHETER INSERTION N/A 12/29/2020    Procedure: DIALYSIS/PERMA CATHETER INSERTION;  Surgeon: Annice Needy, MD;  Location: [facility];  Service: Cardiovascular;  Laterality: N/A;   DIALYSIS/PERMA CATHETER INSERTION N/A 12/14/2021   Procedure: DIALYSIS/PERMA CATHETER INSERTION;  Surgeon: Renford Dills, MD;  Location: [facility];  Service: Cardiovascular;  Laterality: N/A;   EYE SURGERY Bilateral    cataract   LEFT HEART CATH AND CORONARY ANGIOGRAPHY Left 05/06/2018   Procedure: LEFT HEART CATH AND CORONARY ANGIOGRAPHY;  Surgeon: Lamar Blinks, MD;  Location: [facility];  Service: Cardiovascular;  Laterality: Left;   LOWER EXTREMITY ANGIOGRAPHY Right 01/22/2018   Procedure: LOWER EXTREMITY ANGIOGRAPHY;  Surgeon: Renford Dills, MD;  Location: [facility];  Service: Cardiovascular;  Laterality: Right;   RENAL ANGIOGRAPHY Left 02/26/2018   Procedure: RENAL ANGIOGRAPHY;  Surgeon: Renford Dills, MD;  Location: [facility];  Service: Cardiovascular;  Laterality: Left;   STENT PLACEMENT VASCULAR ([facility]) Left    stent placement on LLE   TUBAL LIGATION      Medical History: Past Medical History:  Diagnosis Date   Arthritis    Asthma    Calf pain    Cancer 2016   left breast   COPD (chronic obstructive pulmonary disease)    Diabetes    Discoid lupus    Gastro-esophageal reflux    Glaucoma    Hyperlipemia    Hypertension    Iron deficiency anemia    Joint pain    Leg swelling    Osteoarthritis    PVD (peripheral vascular disease)    Sinus problem    Sleep apnea    No CPAP/ Can't tolerate   Stroke 1987    Family History: Family History  Problem Relation Age of Onset   Heart disease Mother    Diabetes Sister    Leukemia Daughter    Lung cancer Brother    Diabetes Other    Hypertension Other    Bladder Cancer Neg Hx    Kidney disease Neg Hx    Prostate cancer Neg Hx    Breast cancer Neg Hx     Social History   Socioeconomic History   Marital  status: Married    Spouse name: Not on file   Number of children: Not on file   Years of education: Not on file   Highest education level: Not on file  Occupational History   Not on file  Tobacco Use   Smoking status: Former    Types: Cigarettes    Quit date: 1987    Years since quitting: 37.3   Smokeless tobacco: Never   Tobacco comments:    quit 31 years  Vaping Use   Vaping Use: Never used  Substance and Sexual Activity   Alcohol use: No   Drug use: No   Sexual activity: Not on file  Other Topics Concern   Not on file  Social History Narrative   Walks with a rolling  walker; remote hx of smoking; no alcohol; [place]- with husband/son. Daughter lives in [place].    Social Determinants of Health   Financial Resource Strain: Low Risk  (06/23/2020)   Overall Financial Resource Strain (CARDIA)    Difficulty of Paying Living Expenses: Not very hard  Food Insecurity: No Food Insecurity (01/06/2021)   Hunger Vital Sign    Worried About Running Out of Food in the Last Year: Never true    Ran Out of Food in the Last Year: Never true  Transportation Needs: No Transportation Needs (01/06/2021)   PRAPARE - Administrator, Civil Service (Medical): No    Lack of Transportation (Non-Medical): No  Physical Activity: Not on file  Stress: Not on file  Social Connections: Not on file  Intimate Partner Violence: Not on file      Review of Systems  Constitutional:  Negative for chills, fatigue and unexpected weight change.  HENT:  Negative for congestion, postnasal drip, rhinorrhea, sneezing and sore throat.   Eyes:  Negative for redness.  Respiratory:  Negative for cough, chest tightness and shortness of breath.   Cardiovascular:  Negative for chest pain and palpitations.  Gastrointestinal:  Negative for abdominal pain, constipation, diarrhea, nausea and vomiting.  Genitourinary:  Negative for dysuria and frequency.  Musculoskeletal:  Positive for arthralgias.  Negative for back pain, joint swelling and neck pain.  Skin:  Negative for rash.  Neurological: Negative.  Negative for tremors and numbness.  Hematological:  Negative for adenopathy. Does not bruise/bleed easily.  Psychiatric/Behavioral:  Negative for behavioral problems (Depression), sleep disturbance and suicidal ideas. The patient is not nervous/anxious.     Vital Signs: BP (!) 168/73   Pulse 81   Temp 97.8 F (36.6 C)   Resp 16   Ht 5\' 7"  (1.702 m)   Wt 178 lb (80.7 kg)   SpO2 95%   BMI 27.88 kg/m    Physical Exam Constitutional:      General: She is not in acute distress.    Appearance: She is well-developed. She is not diaphoretic.  HENT:     Head: Normocephalic and atraumatic.     Mouth/Throat:     Pharynx: No oropharyngeal exudate.  Eyes:     Pupils: Pupils are equal, round, and reactive to light.  Neck:     Thyroid: No thyromegaly.     Vascular: No JVD.     Trachea: No tracheal deviation.  Cardiovascular:     Rate and Rhythm: Normal rate and regular rhythm.     Heart sounds: Normal heart sounds. No murmur heard.    No friction rub. No gallop.  Pulmonary:     Effort: Pulmonary effort is normal. No respiratory distress.     Breath sounds: No wheezing or rales.  Chest:     Chest wall: No tenderness.  Abdominal:     General: Bowel sounds are normal.     Palpations: Abdomen is soft.  Musculoskeletal:        General: Normal range of motion.     Cervical back: Normal range of motion and neck supple.  Lymphadenopathy:     Cervical: No cervical adenopathy.  Skin:    General: Skin is warm and dry.  Neurological:     Mental Status: She is alert and oriented to person, place, and time.     Cranial Nerves: No cranial nerve deficit.  Psychiatric:        Behavior: Behavior normal.  Thought Content: Thought content normal.        Judgment: Judgment normal.        Assessment/Plan: 1. Primary hypertension Elevated in office, but has not taken meds yet  and will do so now. Continue to monitor  2. Medication side effect Cramping and muscle aches likely from restarting higher dose lipitor and plans to discuss with cardiology   General Counseling: Jani Files understanding of the findings of todays visit and agrees with plan of treatment. I have discussed any further diagnostic evaluation that may be needed or ordered today. We also reviewed her medications today. she has been encouraged to call the office with any questions or concerns that should arise related to todays visit.    No orders of the defined types were placed in this encounter.   No orders of the defined types were placed in this encounter.   This patient was seen by Lynn Ito, PA-C in collaboration with Dr. Beverely Risen as a part of collaborative care agreement.   Total time spent:30 Minutes Time spent includes review of chart, medications, test results, and follow up plan with the patient.      Dr Lyndon Code Internal medicine

## 2022-04-21 DIAGNOSIS — N2581 Secondary hyperparathyroidism of renal origin: Secondary | ICD-10-CM | POA: Diagnosis not present

## 2022-04-21 DIAGNOSIS — Z992 Dependence on renal dialysis: Secondary | ICD-10-CM | POA: Diagnosis not present

## 2022-04-21 DIAGNOSIS — N186 End stage renal disease: Secondary | ICD-10-CM | POA: Diagnosis not present

## 2022-04-23 DIAGNOSIS — Z0100 Encounter for examination of eyes and vision without abnormal findings: Secondary | ICD-10-CM | POA: Diagnosis not present

## 2022-04-24 DIAGNOSIS — Z992 Dependence on renal dialysis: Secondary | ICD-10-CM | POA: Diagnosis not present

## 2022-04-24 DIAGNOSIS — N186 End stage renal disease: Secondary | ICD-10-CM | POA: Diagnosis not present

## 2022-04-24 DIAGNOSIS — N2581 Secondary hyperparathyroidism of renal origin: Secondary | ICD-10-CM | POA: Diagnosis not present

## 2022-04-26 DIAGNOSIS — Z992 Dependence on renal dialysis: Secondary | ICD-10-CM | POA: Diagnosis not present

## 2022-04-26 DIAGNOSIS — N186 End stage renal disease: Secondary | ICD-10-CM | POA: Diagnosis not present

## 2022-04-26 DIAGNOSIS — N2581 Secondary hyperparathyroidism of renal origin: Secondary | ICD-10-CM | POA: Diagnosis not present

## 2022-04-28 DIAGNOSIS — Z992 Dependence on renal dialysis: Secondary | ICD-10-CM | POA: Diagnosis not present

## 2022-04-28 DIAGNOSIS — N2581 Secondary hyperparathyroidism of renal origin: Secondary | ICD-10-CM | POA: Diagnosis not present

## 2022-04-28 DIAGNOSIS — N186 End stage renal disease: Secondary | ICD-10-CM | POA: Diagnosis not present

## 2022-05-01 DIAGNOSIS — N186 End stage renal disease: Secondary | ICD-10-CM | POA: Diagnosis not present

## 2022-05-01 DIAGNOSIS — N2581 Secondary hyperparathyroidism of renal origin: Secondary | ICD-10-CM | POA: Diagnosis not present

## 2022-05-01 DIAGNOSIS — Z992 Dependence on renal dialysis: Secondary | ICD-10-CM | POA: Diagnosis not present

## 2022-05-03 DIAGNOSIS — N2581 Secondary hyperparathyroidism of renal origin: Secondary | ICD-10-CM | POA: Diagnosis not present

## 2022-05-03 DIAGNOSIS — Z992 Dependence on renal dialysis: Secondary | ICD-10-CM | POA: Diagnosis not present

## 2022-05-03 DIAGNOSIS — N186 End stage renal disease: Secondary | ICD-10-CM | POA: Diagnosis not present

## 2022-05-05 DIAGNOSIS — Z992 Dependence on renal dialysis: Secondary | ICD-10-CM | POA: Diagnosis not present

## 2022-05-05 DIAGNOSIS — N186 End stage renal disease: Secondary | ICD-10-CM | POA: Diagnosis not present

## 2022-05-05 DIAGNOSIS — N2581 Secondary hyperparathyroidism of renal origin: Secondary | ICD-10-CM | POA: Diagnosis not present

## 2022-05-08 DIAGNOSIS — N186 End stage renal disease: Secondary | ICD-10-CM | POA: Diagnosis not present

## 2022-05-08 DIAGNOSIS — N2581 Secondary hyperparathyroidism of renal origin: Secondary | ICD-10-CM | POA: Diagnosis not present

## 2022-05-08 DIAGNOSIS — Z992 Dependence on renal dialysis: Secondary | ICD-10-CM | POA: Diagnosis not present

## 2022-05-10 DIAGNOSIS — N2581 Secondary hyperparathyroidism of renal origin: Secondary | ICD-10-CM | POA: Diagnosis not present

## 2022-05-10 DIAGNOSIS — Z992 Dependence on renal dialysis: Secondary | ICD-10-CM | POA: Diagnosis not present

## 2022-05-10 DIAGNOSIS — N186 End stage renal disease: Secondary | ICD-10-CM | POA: Diagnosis not present

## 2022-05-12 DIAGNOSIS — Z992 Dependence on renal dialysis: Secondary | ICD-10-CM | POA: Diagnosis not present

## 2022-05-12 DIAGNOSIS — N2581 Secondary hyperparathyroidism of renal origin: Secondary | ICD-10-CM | POA: Diagnosis not present

## 2022-05-12 DIAGNOSIS — N186 End stage renal disease: Secondary | ICD-10-CM | POA: Diagnosis not present

## 2022-05-15 DIAGNOSIS — N186 End stage renal disease: Secondary | ICD-10-CM | POA: Diagnosis not present

## 2022-05-15 DIAGNOSIS — N2581 Secondary hyperparathyroidism of renal origin: Secondary | ICD-10-CM | POA: Diagnosis not present

## 2022-05-15 DIAGNOSIS — Z992 Dependence on renal dialysis: Secondary | ICD-10-CM | POA: Diagnosis not present

## 2022-05-17 DIAGNOSIS — N186 End stage renal disease: Secondary | ICD-10-CM | POA: Diagnosis not present

## 2022-05-17 DIAGNOSIS — Z992 Dependence on renal dialysis: Secondary | ICD-10-CM | POA: Diagnosis not present

## 2022-05-17 DIAGNOSIS — N2581 Secondary hyperparathyroidism of renal origin: Secondary | ICD-10-CM | POA: Diagnosis not present

## 2022-05-19 DIAGNOSIS — Z992 Dependence on renal dialysis: Secondary | ICD-10-CM | POA: Diagnosis not present

## 2022-05-19 DIAGNOSIS — N186 End stage renal disease: Secondary | ICD-10-CM | POA: Diagnosis not present

## 2022-05-19 DIAGNOSIS — N2581 Secondary hyperparathyroidism of renal origin: Secondary | ICD-10-CM | POA: Diagnosis not present

## 2022-05-22 DIAGNOSIS — Z992 Dependence on renal dialysis: Secondary | ICD-10-CM | POA: Diagnosis not present

## 2022-05-22 DIAGNOSIS — N186 End stage renal disease: Secondary | ICD-10-CM | POA: Diagnosis not present

## 2022-05-22 DIAGNOSIS — N2581 Secondary hyperparathyroidism of renal origin: Secondary | ICD-10-CM | POA: Diagnosis not present

## 2022-05-24 DIAGNOSIS — I10 Essential (primary) hypertension: Secondary | ICD-10-CM | POA: Diagnosis not present

## 2022-05-24 DIAGNOSIS — I340 Nonrheumatic mitral (valve) insufficiency: Secondary | ICD-10-CM | POA: Diagnosis not present

## 2022-05-24 DIAGNOSIS — I6522 Occlusion and stenosis of left carotid artery: Secondary | ICD-10-CM | POA: Diagnosis not present

## 2022-05-24 DIAGNOSIS — I2510 Atherosclerotic heart disease of native coronary artery without angina pectoris: Secondary | ICD-10-CM | POA: Diagnosis not present

## 2022-05-24 DIAGNOSIS — Z7901 Long term (current) use of anticoagulants: Secondary | ICD-10-CM | POA: Diagnosis not present

## 2022-05-24 DIAGNOSIS — N2581 Secondary hyperparathyroidism of renal origin: Secondary | ICD-10-CM | POA: Diagnosis not present

## 2022-05-24 DIAGNOSIS — R0602 Shortness of breath: Secondary | ICD-10-CM | POA: Diagnosis not present

## 2022-05-24 DIAGNOSIS — N186 End stage renal disease: Secondary | ICD-10-CM | POA: Diagnosis not present

## 2022-05-24 DIAGNOSIS — Z9889 Other specified postprocedural states: Secondary | ICD-10-CM | POA: Diagnosis not present

## 2022-05-24 DIAGNOSIS — E785 Hyperlipidemia, unspecified: Secondary | ICD-10-CM | POA: Diagnosis not present

## 2022-05-24 DIAGNOSIS — Z992 Dependence on renal dialysis: Secondary | ICD-10-CM | POA: Diagnosis not present

## 2022-05-26 DIAGNOSIS — N186 End stage renal disease: Secondary | ICD-10-CM | POA: Diagnosis not present

## 2022-05-26 DIAGNOSIS — Z992 Dependence on renal dialysis: Secondary | ICD-10-CM | POA: Diagnosis not present

## 2022-05-26 DIAGNOSIS — N2581 Secondary hyperparathyroidism of renal origin: Secondary | ICD-10-CM | POA: Diagnosis not present

## 2022-05-30 DIAGNOSIS — Z992 Dependence on renal dialysis: Secondary | ICD-10-CM | POA: Diagnosis not present

## 2022-05-30 DIAGNOSIS — N2581 Secondary hyperparathyroidism of renal origin: Secondary | ICD-10-CM | POA: Diagnosis not present

## 2022-05-30 DIAGNOSIS — N186 End stage renal disease: Secondary | ICD-10-CM | POA: Diagnosis not present

## 2022-05-31 DIAGNOSIS — Z992 Dependence on renal dialysis: Secondary | ICD-10-CM | POA: Diagnosis not present

## 2022-05-31 DIAGNOSIS — N2581 Secondary hyperparathyroidism of renal origin: Secondary | ICD-10-CM | POA: Diagnosis not present

## 2022-05-31 DIAGNOSIS — N186 End stage renal disease: Secondary | ICD-10-CM | POA: Diagnosis not present

## 2022-06-02 DIAGNOSIS — N186 End stage renal disease: Secondary | ICD-10-CM | POA: Diagnosis not present

## 2022-06-02 DIAGNOSIS — N2581 Secondary hyperparathyroidism of renal origin: Secondary | ICD-10-CM | POA: Diagnosis not present

## 2022-06-02 DIAGNOSIS — Z992 Dependence on renal dialysis: Secondary | ICD-10-CM | POA: Diagnosis not present

## 2022-06-05 DIAGNOSIS — N2581 Secondary hyperparathyroidism of renal origin: Secondary | ICD-10-CM | POA: Diagnosis not present

## 2022-06-05 DIAGNOSIS — N186 End stage renal disease: Secondary | ICD-10-CM | POA: Diagnosis not present

## 2022-06-05 DIAGNOSIS — Z992 Dependence on renal dialysis: Secondary | ICD-10-CM | POA: Diagnosis not present

## 2022-06-07 DIAGNOSIS — N2581 Secondary hyperparathyroidism of renal origin: Secondary | ICD-10-CM | POA: Diagnosis not present

## 2022-06-07 DIAGNOSIS — Z992 Dependence on renal dialysis: Secondary | ICD-10-CM | POA: Diagnosis not present

## 2022-06-07 DIAGNOSIS — N186 End stage renal disease: Secondary | ICD-10-CM | POA: Diagnosis not present

## 2022-06-08 DIAGNOSIS — Z992 Dependence on renal dialysis: Secondary | ICD-10-CM | POA: Diagnosis not present

## 2022-06-08 DIAGNOSIS — N186 End stage renal disease: Secondary | ICD-10-CM | POA: Diagnosis not present

## 2022-06-09 DIAGNOSIS — N2581 Secondary hyperparathyroidism of renal origin: Secondary | ICD-10-CM | POA: Diagnosis not present

## 2022-06-09 DIAGNOSIS — N186 End stage renal disease: Secondary | ICD-10-CM | POA: Diagnosis not present

## 2022-06-09 DIAGNOSIS — Z992 Dependence on renal dialysis: Secondary | ICD-10-CM | POA: Diagnosis not present

## 2022-06-12 DIAGNOSIS — Z992 Dependence on renal dialysis: Secondary | ICD-10-CM | POA: Diagnosis not present

## 2022-06-12 DIAGNOSIS — N186 End stage renal disease: Secondary | ICD-10-CM | POA: Diagnosis not present

## 2022-06-12 DIAGNOSIS — N2581 Secondary hyperparathyroidism of renal origin: Secondary | ICD-10-CM | POA: Diagnosis not present

## 2022-06-14 DIAGNOSIS — Z992 Dependence on renal dialysis: Secondary | ICD-10-CM | POA: Diagnosis not present

## 2022-06-14 DIAGNOSIS — N186 End stage renal disease: Secondary | ICD-10-CM | POA: Diagnosis not present

## 2022-06-14 DIAGNOSIS — N2581 Secondary hyperparathyroidism of renal origin: Secondary | ICD-10-CM | POA: Diagnosis not present

## 2022-06-16 DIAGNOSIS — Z992 Dependence on renal dialysis: Secondary | ICD-10-CM | POA: Diagnosis not present

## 2022-06-16 DIAGNOSIS — N2581 Secondary hyperparathyroidism of renal origin: Secondary | ICD-10-CM | POA: Diagnosis not present

## 2022-06-16 DIAGNOSIS — N186 End stage renal disease: Secondary | ICD-10-CM | POA: Diagnosis not present

## 2022-06-18 ENCOUNTER — Encounter: Payer: Self-pay | Admitting: Physician Assistant

## 2022-06-18 ENCOUNTER — Ambulatory Visit (INDEPENDENT_AMBULATORY_CARE_PROVIDER_SITE_OTHER): Payer: Medicare HMO | Admitting: Physician Assistant

## 2022-06-18 VITALS — BP 169/71 | HR 83 | Temp 98.0°F | Resp 16 | Ht 67.0 in | Wt 188.0 lb

## 2022-06-18 DIAGNOSIS — Z0001 Encounter for general adult medical examination with abnormal findings: Secondary | ICD-10-CM | POA: Diagnosis not present

## 2022-06-18 DIAGNOSIS — E1122 Type 2 diabetes mellitus with diabetic chronic kidney disease: Secondary | ICD-10-CM | POA: Diagnosis not present

## 2022-06-18 DIAGNOSIS — N186 End stage renal disease: Secondary | ICD-10-CM

## 2022-06-18 DIAGNOSIS — R0602 Shortness of breath: Secondary | ICD-10-CM

## 2022-06-18 DIAGNOSIS — E538 Deficiency of other specified B group vitamins: Secondary | ICD-10-CM | POA: Diagnosis not present

## 2022-06-18 DIAGNOSIS — E559 Vitamin D deficiency, unspecified: Secondary | ICD-10-CM

## 2022-06-18 DIAGNOSIS — Z794 Long term (current) use of insulin: Secondary | ICD-10-CM

## 2022-06-18 DIAGNOSIS — I10 Essential (primary) hypertension: Secondary | ICD-10-CM

## 2022-06-18 DIAGNOSIS — J449 Chronic obstructive pulmonary disease, unspecified: Secondary | ICD-10-CM | POA: Diagnosis not present

## 2022-06-18 DIAGNOSIS — Z992 Dependence on renal dialysis: Secondary | ICD-10-CM | POA: Diagnosis not present

## 2022-06-18 DIAGNOSIS — E038 Other specified hypothyroidism: Secondary | ICD-10-CM | POA: Diagnosis not present

## 2022-06-18 DIAGNOSIS — G4734 Idiopathic sleep related nonobstructive alveolar hypoventilation: Secondary | ICD-10-CM

## 2022-06-18 NOTE — Progress Notes (Signed)
[facility] [address] [place], [place] [address]  Internal MEDICINE  Office Visit Note  Patient Name: Jennifer Carrillo  [id number]  [id number]  Date of Service: 06/18/2022  Chief Complaint  Patient presents with   Medicare Wellness   Diabetes   Gastroesophageal Reflux   Hypertension   Hyperlipidemia     HPI Pt is here for routine health maintenance examination, her husband is with her -some difficulty with sleeping last night -Feels more SOB with exertion, she did have a pulse ox monitored while she walked distance from waiting room to exam room and sats did not drop at all despite patient reporting feeling SOB with the distance. Will check ONO and check labs as she does have ESRD and baseline low hemoglobin/iron which is not being supplemented currently. -Seeing cardiology again, they added hydralazine 50mg  TID. Patient has not taken BP meds today. It is generally controlled at home. Still taking 40mg  atorvastatin, it appears she did not discuss this with cardiology due to her leg pain when she takes this and will follow up on this. -Continues to receive dialysis treatments  Current Medication: Outpatient Encounter Medications as of 06/18/2022  Medication Sig   ACCU-CHEK GUIDE test strip TEST BLOOD SUGAR THREE TIMES DAILY  AS  DIRECTED   Accu-Chek Softclix Lancets lancets USE AS INSTRUCTED 3 TIMES A DAY   acetaminophen (TYLENOL) 500 MG tablet Take 1 tablet (500 mg total) by mouth every 6 (six) hours as needed for moderate pain or headache.   Alcohol Swabs (B-D SINGLE USE SWABS REGULAR) PADS Use as directed for 3 times daily DX E11.65   amLODipine (NORVASC) 10 MG tablet TAKE 1 TABLET EVERY DAY AT BREAKFAST   anastrozole (ARIMIDEX) 1 MG tablet TAKE 1 TABLET EVERY DAY IN THE AFTERNOON (DINNER)   atorvastatin (LIPITOR) 40 MG tablet Take 40 mg by mouth daily.   Blood Glucose Monitoring Suppl (ACCU-CHEK GUIDE ME) w/Device KIT Use as instructed. DX e11.65    cholecalciferol (VITAMIN D) 25 MCG (1000 UNIT) tablet TAKE 1 TABLET EVERY MONDAY, WEDNESDAY, AND FRIDAY.   clopidogrel (PLAVIX) 75 MG tablet TAKE 1 TABLET DAILY AT DINNER   docusate sodium (COLACE) 100 MG capsule Take 200 mg by mouth at bedtime as needed for mild constipation.   donepezil (ARICEPT) 10 MG tablet TAKE 1 TABLET AT BEDTIME FOR MEMORY (NEED MD APPOINTMENT)   fluticasone-salmeterol (ADVAIR) 100-50 MCG/ACT AEPB Inhale 1 puff into the lungs 2 (two) times daily.   insulin NPH-regular Human (70-30) 100 UNIT/ML injection Inject 12 Units into the skin 2 (two) times daily. Inject 20 units subcutaneously in the morning and 20 units in the evening. (Patient taking differently: Inject into the skin daily. Use as directed 1-2 units as needed if glucose is fasting glucose 150)   isosorbide mononitrate (IMDUR) 60 MG 24 hr tablet Take 60 mg by mouth daily.   ondansetron (ZOFRAN) 4 MG tablet Take 1 tablet (4 mg total) by mouth every 6 (six) hours as needed for nausea.   hydrALAZINE (APRESOLINE) 50 MG tablet Take 100 mg by mouth in the morning and at bedtime. Take one tablet in AM and two tablets in PM   No facility-administered encounter medications on file as of 06/18/2022.    Surgical History: Past Surgical History:  Procedure Laterality Date   BREAST SURGERY Left    left lumpectomy   CAROTID ANGIOGRAPHY Left 06/25/2018   Procedure: CAROTID ANGIOGRAPHY, possible intervention;  Surgeon: Renford Dills, MD;  Location: [facility] IN[facility]ASIVE [facility]  [facility];  Service: Cardiovascular;  Laterality: Left;   CAROTID ENDARTERECTOMY Right    CAROTID PTA/STENT INTERVENTION Left 06/25/2018   Procedure: CAROTID PTA/STENT INTERVENTION;  Surgeon: Renford Dills, MD;  Location: [facility];  Service: Cardiovascular;  Laterality: Left;   CATARACT EXTRACTION Right 2013   CORONARY STENT PLACEMENT     L. L. E.   DIALYSIS/PERMA CATHETER INSERTION N/A 12/29/2020   Procedure: DIALYSIS/PERMA CATHETER INSERTION;   Surgeon: Annice Needy, MD;  Location: [facility];  Service: Cardiovascular;  Laterality: N/A;   DIALYSIS/PERMA CATHETER INSERTION N/A 12/14/2021   Procedure: DIALYSIS/PERMA CATHETER INSERTION;  Surgeon: Renford Dills, MD;  Location: [facility];  Service: Cardiovascular;  Laterality: N/A;   EYE SURGERY Bilateral    cataract   LEFT HEART CATH AND CORONARY ANGIOGRAPHY Left 05/06/2018   Procedure: LEFT HEART CATH AND CORONARY ANGIOGRAPHY;  Surgeon: Lamar Blinks, MD;  Location: [facility];  Service: Cardiovascular;  Laterality: Left;   LOWER EXTREMITY ANGIOGRAPHY Right 01/22/2018   Procedure: LOWER EXTREMITY ANGIOGRAPHY;  Surgeon: Renford Dills, MD;  Location: [facility];  Service: Cardiovascular;  Laterality: Right;   RENAL ANGIOGRAPHY Left 02/26/2018   Procedure: RENAL ANGIOGRAPHY;  Surgeon: Renford Dills, MD;  Location: [facility];  Service: Cardiovascular;  Laterality: Left;   STENT PLACEMENT VASCULAR ([facility] HX) Left    stent placement on LLE   TUBAL LIGATION      Medical History: Past Medical History:  Diagnosis Date   Arthritis    Asthma    Calf pain    Cancer (HCC) 2016   left breast   COPD (chronic obstructive pulmonary disease) (HCC)    Diabetes (HCC)    Discoid lupus    Gastro-esophageal reflux    Glaucoma    Hyperlipemia    Hypertension    Iron deficiency anemia    Joint pain    Leg swelling    Osteoarthritis    PVD (peripheral vascular disease) (HCC)    Sinus problem    Sleep apnea    No CPAP/ Can't tolerate   Stroke (HCC) 1987    Family History: Family History  Problem Relation Age of Onset   Heart disease Mother    Diabetes Sister    Leukemia Daughter    Lung cancer Brother    Diabetes Other    Hypertension Other    Bladder Cancer Neg Hx    Kidney disease Neg Hx    Prostate cancer Neg Hx    Breast cancer Neg Hx       Review of Systems  Constitutional:  Negative for chills, fatigue and  unexpected weight change.  HENT:  Negative for congestion, postnasal drip, rhinorrhea, sneezing and sore throat.   Eyes:  Negative for redness.  Respiratory:  Positive for shortness of breath. Negative for cough and chest tightness.   Cardiovascular:  Negative for chest pain and palpitations.  Gastrointestinal:  Negative for abdominal pain, constipation, diarrhea, nausea and vomiting.  Genitourinary:  Negative for dysuria and frequency.  Musculoskeletal:  Positive for arthralgias and myalgias. Negative for back pain, joint swelling and neck pain.  Skin:  Negative for rash.  Neurological: Negative.  Negative for tremors and numbness.  Hematological:  Negative for adenopathy. Does not bruise/bleed easily.  Psychiatric/Behavioral:  Positive for sleep disturbance. Negative for behavioral problems (Depression) and suicidal ideas. The patient is not nervous/anxious.      Vital Signs: BP (!) 169/71  Pulse 83   Temp 98 F (36.7 C)   Resp 16   Ht 5\' 7"  (1.702 m)   Wt 188 lb (85.3 kg)   SpO2 99%   BMI 29.44 kg/m    Physical Exam Vitals and nursing note reviewed.  Constitutional:      General: She is not in acute distress.    Appearance: Normal appearance. She is well-developed. She is not diaphoretic.  HENT:     Head: Normocephalic and atraumatic.     Mouth/Throat:     Pharynx: No oropharyngeal exudate.  Eyes:     Pupils: Pupils are equal, round, and reactive to light.  Neck:     Thyroid: No thyromegaly.     Vascular: No JVD.     Trachea: No tracheal deviation.  Cardiovascular:     Rate and Rhythm: Normal rate and regular rhythm.     Heart sounds: Normal heart sounds. No murmur heard.    No friction rub. No gallop.  Pulmonary:     Effort: Pulmonary effort is normal. No respiratory distress.     Breath sounds: No wheezing or rales.  Chest:     Chest wall: No tenderness.  Abdominal:     General: Bowel sounds are normal.     Palpations: Abdomen is soft.     Tenderness:  There is no abdominal tenderness.  Musculoskeletal:        General: Normal range of motion.     Cervical back: Normal range of motion and neck supple.     Right lower leg: No edema.     Left lower leg: No edema.  Lymphadenopathy:     Cervical: No cervical adenopathy.  Skin:    General: Skin is warm and dry.  Neurological:     Mental Status: She is alert and oriented to person, place, and time.     Cranial Nerves: No cranial nerve deficit.  Psychiatric:        Behavior: Behavior normal.        Thought Content: Thought content normal.        Judgment: Judgment normal.      LABS: Recent Results (from the past 2160 hour(s))  Hemoglobin and hematocrit, blood     Status: Abnormal   Collection Time: 03/27/22 12:00 PM  Result Value Ref Range   Hemoglobin 7.9 (L) 12.0 - 15.0 g/dL   HCT 16.1 (L) 09.6 - 04.5 %    Comment: Performed at [facility], [address] [place], [place] [address]  POCT HgB A1C     Status: None   Collection Time: 03/30/22 12:00 PM  Result Value Ref Range   Hemoglobin A1C 5.1 4.0 - 5.6 %   HbA1c POC (<> result, manual entry)     HbA1c, POC (prediabetic range)     HbA1c, POC (controlled diabetic range)          Assessment/Plan: 1. Encounter for general adult medical examination with abnormal findings CPE performed, labs ordered - CBC w/Diff/Platelet - Fe+TIBC+Fer - TSH + free T4 - B12 and Folate Panel - VITAMIN D 25 Hydroxy (Vit-D Deficiency, Fractures)  2. Primary hypertension Elevated in office due to not taking BP meds yet before appt and will monitor  3. Type 2 diabetes mellitus with chronic kidney disease on chronic dialysis, with long-term current use of insulin (HCC) No longer taking any medication, has insulin just in case sugars spike  4. ESRD (end stage renal disease) (HCC) - CBC w/Diff/Platelet - Fe+TIBC+Fer  5.  B12 deficiency - B12 and Folate Panel  6. Vitamin D deficiency - VITAMIN D 25 Hydroxy (Vit-D Deficiency,  Fractures)  7. SOB (shortness of breath) - Pulse oximetry, overnight; Future  8. Nocturnal hypoxemia - Pulse oximetry, overnight; Future   General Counseling: Amry verbalizes understanding of the findings of todays visit and agrees with plan of treatment. I have discussed any further diagnostic evaluation that may be needed or ordered today. We also reviewed her medications today. she has been encouraged to call the office with any questions or concerns that should arise related to todays visit.    Counseling:    Orders Placed This Encounter  Procedures   CBC w/Diff/Platelet   Fe+TIBC+Fer   TSH + free T4   B12 and Folate Panel   VITAMIN D 25 Hydroxy (Vit-D Deficiency, Fractures)   Pulse oximetry, overnight    No orders of the defined types were placed in this encounter.   This patient was seen by Lynn Ito, PA-C in collaboration with Dr. Beverely Risen as a part of collaborative care agreement.  Total time spent:30 Minutes  Time spent includes review of chart, medications, test results, and follow up plan with the patient.     Lyndon Code, MD  Internal Medicine

## 2022-06-19 DIAGNOSIS — N186 End stage renal disease: Secondary | ICD-10-CM | POA: Diagnosis not present

## 2022-06-19 DIAGNOSIS — Z992 Dependence on renal dialysis: Secondary | ICD-10-CM | POA: Diagnosis not present

## 2022-06-19 DIAGNOSIS — N2581 Secondary hyperparathyroidism of renal origin: Secondary | ICD-10-CM | POA: Diagnosis not present

## 2022-06-19 LAB — CBC WITH DIFFERENTIAL/PLATELET
Basophils Absolute: 0.1 10*3/uL (ref 0.0–0.2)
Basos: 1 %
EOS (ABSOLUTE): 0.1 10*3/uL (ref 0.0–0.4)
Eos: 2 %
Hematocrit: 26.3 % — ABNORMAL LOW (ref 34.0–46.6)
Hemoglobin: 8.1 g/dL — ABNORMAL LOW (ref 11.1–15.9)
Immature Grans (Abs): 0 10*3/uL (ref 0.0–0.1)
Immature Granulocytes: 0 %
Lymphocytes Absolute: 1.4 10*3/uL (ref 0.7–3.1)
Lymphs: 22 %
MCH: 24.8 pg — ABNORMAL LOW (ref 26.6–33.0)
MCHC: 30.8 g/dL — ABNORMAL LOW (ref 31.5–35.7)
MCV: 81 fL (ref 79–97)
Monocytes Absolute: 0.6 10*3/uL (ref 0.1–0.9)
Monocytes: 9 %
Neutrophils Absolute: 4.1 10*3/uL (ref 1.4–7.0)
Neutrophils: 66 %
Platelets: 300 10*3/uL (ref 150–450)
RBC: 3.26 x10E6/uL — ABNORMAL LOW (ref 3.77–5.28)
RDW: 17.3 % — ABNORMAL HIGH (ref 11.7–15.4)
WBC: 6.3 10*3/uL (ref 3.4–10.8)

## 2022-06-19 LAB — VITAMIN D 25 HYDROXY (VIT D DEFICIENCY, FRACTURES): Vit D, 25-Hydroxy: 12.3 ng/mL — ABNORMAL LOW (ref 30.0–100.0)

## 2022-06-19 LAB — IRON,TIBC AND FERRITIN PANEL
Ferritin: 273 ng/mL — ABNORMAL HIGH (ref 15–150)
Iron Saturation: 12 % — ABNORMAL LOW (ref 15–55)
Iron: 32 ug/dL (ref 27–139)
Total Iron Binding Capacity: 257 ug/dL (ref 250–450)
UIBC: 225 ug/dL (ref 118–369)

## 2022-06-19 LAB — TSH+FREE T4
Free T4: 1.28 ng/dL (ref 0.82–1.77)
TSH: 2.25 u[IU]/mL (ref 0.450–4.500)

## 2022-06-19 LAB — B12 AND FOLATE PANEL
Folate: 15.8 ng/mL (ref >3.0)
Vitamin B-12: 523 pg/mL (ref 232–1245)

## 2022-06-21 DIAGNOSIS — N186 End stage renal disease: Secondary | ICD-10-CM | POA: Diagnosis not present

## 2022-06-21 DIAGNOSIS — Z992 Dependence on renal dialysis: Secondary | ICD-10-CM | POA: Diagnosis not present

## 2022-06-21 DIAGNOSIS — N2581 Secondary hyperparathyroidism of renal origin: Secondary | ICD-10-CM | POA: Diagnosis not present

## 2022-06-23 DIAGNOSIS — Z992 Dependence on renal dialysis: Secondary | ICD-10-CM | POA: Diagnosis not present

## 2022-06-23 DIAGNOSIS — N2581 Secondary hyperparathyroidism of renal origin: Secondary | ICD-10-CM | POA: Diagnosis not present

## 2022-06-23 DIAGNOSIS — N186 End stage renal disease: Secondary | ICD-10-CM | POA: Diagnosis not present

## 2022-06-26 ENCOUNTER — Other Ambulatory Visit: Payer: Self-pay

## 2022-06-26 ENCOUNTER — Telehealth: Payer: Self-pay

## 2022-06-26 ENCOUNTER — Telehealth: Payer: Self-pay | Admitting: Physician Assistant

## 2022-06-26 DIAGNOSIS — Z992 Dependence on renal dialysis: Secondary | ICD-10-CM | POA: Diagnosis not present

## 2022-06-26 DIAGNOSIS — N2581 Secondary hyperparathyroidism of renal origin: Secondary | ICD-10-CM | POA: Diagnosis not present

## 2022-06-26 DIAGNOSIS — N186 End stage renal disease: Secondary | ICD-10-CM | POA: Diagnosis not present

## 2022-06-26 MED ORDER — ERGOCALCIFEROL 1.25 MG (50000 UT) PO CAPS: 50000.0000 [IU] | ORAL_CAPSULE | ORAL | 3 refills | Status: DC

## 2022-06-26 NOTE — Telephone Encounter (Signed)
Labs faxed to [facility]; [phone]

## 2022-06-26 NOTE — Telephone Encounter (Signed)
Pt daughter advised for labs result  and  faxed labs to dr singh

## 2022-06-26 NOTE — Telephone Encounter (Signed)
-----   Message from Carlean Jews, PA-C sent at 06/26/2022 12:49 PM EDT ----- Please let her know that her Vit D is very low and send drisdol. Her hemoglobin is low but slightly improved from last check. Her iron is also low and should be supplementing. She may want to discuss results with her nephrologist as this is the underlying cause of her anemia and may be contributing to her fatigue and SOB. May consider a hematology referral if needed.

## 2022-06-28 DIAGNOSIS — N186 End stage renal disease: Secondary | ICD-10-CM | POA: Diagnosis not present

## 2022-06-28 DIAGNOSIS — Z992 Dependence on renal dialysis: Secondary | ICD-10-CM | POA: Diagnosis not present

## 2022-06-28 DIAGNOSIS — N2581 Secondary hyperparathyroidism of renal origin: Secondary | ICD-10-CM | POA: Diagnosis not present

## 2022-06-30 DIAGNOSIS — N186 End stage renal disease: Secondary | ICD-10-CM | POA: Diagnosis not present

## 2022-06-30 DIAGNOSIS — N2581 Secondary hyperparathyroidism of renal origin: Secondary | ICD-10-CM | POA: Diagnosis not present

## 2022-06-30 DIAGNOSIS — Z992 Dependence on renal dialysis: Secondary | ICD-10-CM | POA: Diagnosis not present

## 2022-07-03 ENCOUNTER — Other Ambulatory Visit: Payer: Self-pay | Admitting: Internal Medicine

## 2022-07-03 DIAGNOSIS — E119 Type 2 diabetes mellitus without complications: Secondary | ICD-10-CM | POA: Diagnosis not present

## 2022-07-03 DIAGNOSIS — N2581 Secondary hyperparathyroidism of renal origin: Secondary | ICD-10-CM | POA: Diagnosis not present

## 2022-07-03 DIAGNOSIS — Z794 Long term (current) use of insulin: Secondary | ICD-10-CM | POA: Diagnosis not present

## 2022-07-03 DIAGNOSIS — Z992 Dependence on renal dialysis: Secondary | ICD-10-CM | POA: Diagnosis not present

## 2022-07-03 DIAGNOSIS — N186 End stage renal disease: Secondary | ICD-10-CM | POA: Diagnosis not present

## 2022-07-03 NOTE — Telephone Encounter (Signed)
Please review ok to send  

## 2022-07-04 NOTE — Telephone Encounter (Signed)
I think she should request this from Oncology because you don't have to take it between 3-5 years after breast CA diagnosis

## 2022-07-05 DIAGNOSIS — Z992 Dependence on renal dialysis: Secondary | ICD-10-CM | POA: Diagnosis not present

## 2022-07-05 DIAGNOSIS — N186 End stage renal disease: Secondary | ICD-10-CM | POA: Diagnosis not present

## 2022-07-05 DIAGNOSIS — N2581 Secondary hyperparathyroidism of renal origin: Secondary | ICD-10-CM | POA: Diagnosis not present

## 2022-07-07 DIAGNOSIS — Z992 Dependence on renal dialysis: Secondary | ICD-10-CM | POA: Diagnosis not present

## 2022-07-07 DIAGNOSIS — N186 End stage renal disease: Secondary | ICD-10-CM | POA: Diagnosis not present

## 2022-07-07 DIAGNOSIS — N2581 Secondary hyperparathyroidism of renal origin: Secondary | ICD-10-CM | POA: Diagnosis not present

## 2022-07-08 DIAGNOSIS — N186 End stage renal disease: Secondary | ICD-10-CM | POA: Diagnosis not present

## 2022-07-08 DIAGNOSIS — Z992 Dependence on renal dialysis: Secondary | ICD-10-CM | POA: Diagnosis not present

## 2022-07-09 ENCOUNTER — Ambulatory Visit: Payer: Medicare HMO | Admitting: Physician Assistant

## 2022-07-10 DIAGNOSIS — N186 End stage renal disease: Secondary | ICD-10-CM | POA: Diagnosis not present

## 2022-07-10 DIAGNOSIS — Z992 Dependence on renal dialysis: Secondary | ICD-10-CM | POA: Diagnosis not present

## 2022-07-10 DIAGNOSIS — N2581 Secondary hyperparathyroidism of renal origin: Secondary | ICD-10-CM | POA: Diagnosis not present

## 2022-07-11 ENCOUNTER — Other Ambulatory Visit: Payer: Self-pay

## 2022-07-11 ENCOUNTER — Telehealth: Payer: Self-pay

## 2022-07-11 MED ORDER — ANASTROZOLE 1 MG PO TABS: ORAL_TABLET | ORAL | 0 refills | Status: DC

## 2022-07-11 NOTE — Telephone Encounter (Signed)
Sent patient's Anastrazole 1mg , 90 tabs, no refills to [facility], ok per Leotis Shames.

## 2022-07-12 DIAGNOSIS — Z992 Dependence on renal dialysis: Secondary | ICD-10-CM | POA: Diagnosis not present

## 2022-07-12 DIAGNOSIS — N186 End stage renal disease: Secondary | ICD-10-CM | POA: Diagnosis not present

## 2022-07-12 DIAGNOSIS — N2581 Secondary hyperparathyroidism of renal origin: Secondary | ICD-10-CM | POA: Diagnosis not present

## 2022-07-13 NOTE — Telephone Encounter (Signed)
Pt advised by Sharlet Salina that we sent med

## 2022-07-14 DIAGNOSIS — Z992 Dependence on renal dialysis: Secondary | ICD-10-CM | POA: Diagnosis not present

## 2022-07-14 DIAGNOSIS — N186 End stage renal disease: Secondary | ICD-10-CM | POA: Diagnosis not present

## 2022-07-14 DIAGNOSIS — N2581 Secondary hyperparathyroidism of renal origin: Secondary | ICD-10-CM | POA: Diagnosis not present

## 2022-07-17 DIAGNOSIS — N2581 Secondary hyperparathyroidism of renal origin: Secondary | ICD-10-CM | POA: Diagnosis not present

## 2022-07-17 DIAGNOSIS — Z992 Dependence on renal dialysis: Secondary | ICD-10-CM | POA: Diagnosis not present

## 2022-07-17 DIAGNOSIS — N186 End stage renal disease: Secondary | ICD-10-CM | POA: Diagnosis not present

## 2022-07-19 DIAGNOSIS — N2581 Secondary hyperparathyroidism of renal origin: Secondary | ICD-10-CM | POA: Diagnosis not present

## 2022-07-19 DIAGNOSIS — N186 End stage renal disease: Secondary | ICD-10-CM | POA: Diagnosis not present

## 2022-07-19 DIAGNOSIS — Z992 Dependence on renal dialysis: Secondary | ICD-10-CM | POA: Diagnosis not present

## 2022-07-21 DIAGNOSIS — N2581 Secondary hyperparathyroidism of renal origin: Secondary | ICD-10-CM | POA: Diagnosis not present

## 2022-07-21 DIAGNOSIS — Z992 Dependence on renal dialysis: Secondary | ICD-10-CM | POA: Diagnosis not present

## 2022-07-21 DIAGNOSIS — N186 End stage renal disease: Secondary | ICD-10-CM | POA: Diagnosis not present

## 2022-07-24 DIAGNOSIS — N2581 Secondary hyperparathyroidism of renal origin: Secondary | ICD-10-CM | POA: Diagnosis not present

## 2022-07-24 DIAGNOSIS — Z992 Dependence on renal dialysis: Secondary | ICD-10-CM | POA: Diagnosis not present

## 2022-07-24 DIAGNOSIS — N186 End stage renal disease: Secondary | ICD-10-CM | POA: Diagnosis not present

## 2022-07-26 DIAGNOSIS — N186 End stage renal disease: Secondary | ICD-10-CM | POA: Diagnosis not present

## 2022-07-26 DIAGNOSIS — N2581 Secondary hyperparathyroidism of renal origin: Secondary | ICD-10-CM | POA: Diagnosis not present

## 2022-07-26 DIAGNOSIS — Z992 Dependence on renal dialysis: Secondary | ICD-10-CM | POA: Diagnosis not present

## 2022-07-28 DIAGNOSIS — N2581 Secondary hyperparathyroidism of renal origin: Secondary | ICD-10-CM | POA: Diagnosis not present

## 2022-07-28 DIAGNOSIS — N186 End stage renal disease: Secondary | ICD-10-CM | POA: Diagnosis not present

## 2022-07-28 DIAGNOSIS — Z992 Dependence on renal dialysis: Secondary | ICD-10-CM | POA: Diagnosis not present

## 2022-07-31 DIAGNOSIS — Z992 Dependence on renal dialysis: Secondary | ICD-10-CM | POA: Diagnosis not present

## 2022-07-31 DIAGNOSIS — N186 End stage renal disease: Secondary | ICD-10-CM | POA: Diagnosis not present

## 2022-07-31 DIAGNOSIS — N2581 Secondary hyperparathyroidism of renal origin: Secondary | ICD-10-CM | POA: Diagnosis not present

## 2022-08-02 DIAGNOSIS — N2581 Secondary hyperparathyroidism of renal origin: Secondary | ICD-10-CM | POA: Diagnosis not present

## 2022-08-02 DIAGNOSIS — Z992 Dependence on renal dialysis: Secondary | ICD-10-CM | POA: Diagnosis not present

## 2022-08-02 DIAGNOSIS — N186 End stage renal disease: Secondary | ICD-10-CM | POA: Diagnosis not present

## 2022-08-03 ENCOUNTER — Ambulatory Visit (INDEPENDENT_AMBULATORY_CARE_PROVIDER_SITE_OTHER): Payer: Medicare HMO | Admitting: Physician Assistant

## 2022-08-03 ENCOUNTER — Encounter: Payer: Self-pay | Admitting: Physician Assistant

## 2022-08-03 ENCOUNTER — Ambulatory Visit: Payer: Medicare HMO | Admitting: Physician Assistant

## 2022-08-03 VITALS — BP 130/70 | HR 83 | Temp 98.1°F | Resp 16 | Ht 67.0 in | Wt 186.0 lb

## 2022-08-03 DIAGNOSIS — Z992 Dependence on renal dialysis: Secondary | ICD-10-CM | POA: Diagnosis not present

## 2022-08-03 DIAGNOSIS — Z853 Personal history of malignant neoplasm of breast: Secondary | ICD-10-CM | POA: Diagnosis not present

## 2022-08-03 DIAGNOSIS — I10 Essential (primary) hypertension: Secondary | ICD-10-CM

## 2022-08-03 DIAGNOSIS — D631 Anemia in chronic kidney disease: Secondary | ICD-10-CM

## 2022-08-03 DIAGNOSIS — N186 End stage renal disease: Secondary | ICD-10-CM

## 2022-08-03 NOTE — Progress Notes (Signed)
[facility] [address] [place], [place] [address]  Internal MEDICINE  Office Visit Note  Patient Name: Jennifer Carrillo  [id number]  [id number]  Date of Service: 08/15/2022  Chief Complaint  Patient presents with   Follow-up   Diabetes   Gastroesophageal Reflux   Hypertension   Hyperlipidemia    HPI Pt is here for routine follow up -Great grand daughter has birthday tomorrow in [place] and hoped to go, but has dialysis and understands importance of not missing this -Labs reviewed: IDA due to CKD, low vit D and is taking drisdol -Feeling better, less SOB with exertion than last visit. Will follow up on overnight oximetry order. Lungs sound clear and oxygen at 98% in office. Discussed fatigue and SOB could have been from low iron/hemoglobin and this is slowly improving. Had discussed establishing with hematology to monitor this along with nephrology. Would also benefit from referral as she has been taking anastrozole for many years after breast cancer dx and states she was told to remain on this forever, however typically this is stopped after 5 years. Her former hematologist no longer practices, therefore new referral to address this is warranted and pt is agreeable -BP improved  Current Medication: Outpatient Encounter Medications as of 08/03/2022  Medication Sig   ACCU-CHEK GUIDE test strip TEST BLOOD SUGAR THREE TIMES DAILY  AS  DIRECTED   Accu-Chek Softclix Lancets lancets USE AS INSTRUCTED 3 TIMES A DAY   acetaminophen (TYLENOL) 500 MG tablet Take 1 tablet (500 mg total) by mouth every 6 (six) hours as needed for moderate pain or headache.   Alcohol Swabs (B-D SINGLE USE SWABS REGULAR) PADS Use as directed for 3 times daily DX E11.65   amLODipine (NORVASC) 10 MG tablet TAKE 1 TABLET EVERY DAY AT BREAKFAST   anastrozole (ARIMIDEX) 1 MG tablet TAKE 1 TABLET EVERY DAY IN THE AFTERNOON (DINNER)   atorvastatin (LIPITOR) 40 MG tablet Take 40 mg by mouth daily.   Blood Glucose  Monitoring Suppl (ACCU-CHEK GUIDE ME) w/Device KIT Use as instructed. DX e11.65   clopidogrel (PLAVIX) 75 MG tablet TAKE 1 TABLET DAILY AT DINNER   docusate sodium (COLACE) 100 MG capsule Take 200 mg by mouth at bedtime as needed for mild constipation.   donepezil (ARICEPT) 10 MG tablet TAKE 1 TABLET AT BEDTIME FOR MEMORY (NEED MD APPOINTMENT)   ergocalciferol (VITAMIN D2) 1.25 MG (50000 UT) capsule Take 1 capsule (50,000 Units total) by mouth once a week.   fluticasone-salmeterol (ADVAIR) 100-50 MCG/ACT AEPB Inhale 1 puff into the lungs 2 (two) times daily.   insulin NPH-regular Human (70-30) 100 UNIT/ML injection Inject 12 Units into the skin 2 (two) times daily. Inject 20 units subcutaneously in the morning and 20 units in the evening. (Patient taking differently: Inject into the skin daily. Use as directed 1-2 units as needed if glucose is fasting glucose 150)   isosorbide mononitrate (IMDUR) 60 MG 24 hr tablet Take 60 mg by mouth daily.   ondansetron (ZOFRAN) 4 MG tablet Take 1 tablet (4 mg total) by mouth every 6 (six) hours as needed for nausea.   hydrALAZINE (APRESOLINE) 50 MG tablet Take 100 mg by mouth in the morning and at bedtime. Take one tablet in AM and two tablets in PM   No facility-administered encounter medications on file as of 08/03/2022.    Surgical History: Past Surgical History:  Procedure Laterality Date   BREAST SURGERY Left    left lumpectomy   CAROTID ANGIOGRAPHY Left  06/25/2018   Procedure: CAROTID ANGIOGRAPHY, possible intervention;  Surgeon: Renford Dills, MD;  Location: [facility];  Service: Cardiovascular;  Laterality: Left;   CAROTID ENDARTERECTOMY Right    CAROTID PTA/STENT INTERVENTION Left 06/25/2018   Procedure: CAROTID PTA/STENT INTERVENTION;  Surgeon: Renford Dills, MD;  Location: [facility];  Service: Cardiovascular;  Laterality: Left;   CATARACT EXTRACTION Right 2013   CORONARY STENT PLACEMENT     L. L. E.    DIALYSIS/PERMA CATHETER INSERTION N/A 12/29/2020   Procedure: DIALYSIS/PERMA CATHETER INSERTION;  Surgeon: Annice Needy, MD;  Location: [facility];  Service: Cardiovascular;  Laterality: N/A;   DIALYSIS/PERMA CATHETER INSERTION N/A 12/14/2021   Procedure: DIALYSIS/PERMA CATHETER INSERTION;  Surgeon: Renford Dills, MD;  Location: [facility];  Service: Cardiovascular;  Laterality: N/A;   EYE SURGERY Bilateral    cataract   LEFT HEART CATH AND CORONARY ANGIOGRAPHY Left 05/06/2018   Procedure: LEFT HEART CATH AND CORONARY ANGIOGRAPHY;  Surgeon: Lamar Blinks, MD;  Location: [facility];  Service: Cardiovascular;  Laterality: Left;   LOWER EXTREMITY ANGIOGRAPHY Right 01/22/2018   Procedure: LOWER EXTREMITY ANGIOGRAPHY;  Surgeon: Renford Dills, MD;  Location: [facility];  Service: Cardiovascular;  Laterality: Right;   RENAL ANGIOGRAPHY Left 02/26/2018   Procedure: RENAL ANGIOGRAPHY;  Surgeon: Renford Dills, MD;  Location: [facility];  Service: Cardiovascular;  Laterality: Left;   STENT PLACEMENT VASCULAR ([facility] HX) Left    stent placement on LLE   TUBAL LIGATION      Medical History: Past Medical History:  Diagnosis Date   Arthritis    Asthma    Calf pain    Cancer (HCC) 2016   left breast   COPD (chronic obstructive pulmonary disease) (HCC)    Diabetes (HCC)    Discoid lupus    Gastro-esophageal reflux    Glaucoma    Hyperlipemia    Hypertension    Iron deficiency anemia    Joint pain    Leg swelling    Osteoarthritis    PVD (peripheral vascular disease) (HCC)    Sinus problem    Sleep apnea    No CPAP/ Can't tolerate   Stroke (HCC) 1987    Family History: Family History  Problem Relation Age of Onset   Heart disease Mother    Diabetes Sister    Leukemia Daughter    Lung cancer Brother    Diabetes Other    Hypertension Other    Bladder Cancer Neg Hx    Kidney disease Neg Hx    Prostate cancer Neg Hx     Breast cancer Neg Hx     Social History   Socioeconomic History   Marital status: Married    Spouse name: Not on file   Number of children: Not on file   Years of education: Not on file   Highest education level: Not on file  Occupational History   Not on file  Tobacco Use   Smoking status: Former    Current packs/day: 0.00    Types: Cigarettes    Quit date: 1987    Years since quitting: 37.6   Smokeless tobacco: Never   Tobacco comments:    quit 31 years  Vaping Use   Vaping status: Never Used  Substance and Sexual Activity   Alcohol use: No   Drug use: No   Sexual activity: Not on file  Other Topics Concern  Not on file  Social History Narrative   Walks with a rolling walker; remote hx of smoking; no alcohol; [place]- with husband/son. Daughter lives in [place].    Social Determinants of Health   Financial Resource Strain: Low Risk  (06/23/2020)   Overall Financial Resource Strain (CARDIA)    Difficulty of Paying Living Expenses: Not very hard  Food Insecurity: Food Insecurity Present (08/10/2022)   Hunger Vital Sign    Worried About Running Out of Food in the Last Year: Sometimes true    Ran Out of Food in the Last Year: Sometimes true  Transportation Needs: No Transportation Needs (01/06/2021)   PRAPARE - Administrator, Civil Service (Medical): No    Lack of Transportation (Non-Medical): No  Physical Activity: Not on file  Stress: Not on file  Social Connections: Not on file  Intimate Partner Violence: Not At Risk (08/10/2022)   Humiliation, Afraid, Rape, and Kick questionnaire    Fear of Current or Ex-Partner: No    Emotionally Abused: No    Physically Abused: No    Sexually Abused: No      Review of Systems  Constitutional:  Positive for fatigue. Negative for chills and unexpected weight change.  HENT:  Negative for congestion, postnasal drip, rhinorrhea, sneezing and sore throat.   Eyes:  Negative for redness.  Respiratory:  Positive  for shortness of breath. Negative for cough and chest tightness.   Cardiovascular:  Negative for chest pain and palpitations.  Gastrointestinal:  Negative for abdominal pain, constipation, diarrhea, nausea and vomiting.  Genitourinary:  Negative for dysuria and frequency.  Musculoskeletal:  Positive for arthralgias and myalgias. Negative for back pain, joint swelling and neck pain.  Skin:  Negative for rash.  Neurological: Negative.  Negative for tremors and numbness.  Hematological:  Negative for adenopathy. Does not bruise/bleed easily.  Psychiatric/Behavioral:  Positive for sleep disturbance. Negative for behavioral problems (Depression) and suicidal ideas. The patient is not nervous/anxious.     Vital Signs: BP 130/70   Pulse 83   Temp 98.1 F (36.7 C)   Resp 16   Ht 5\' 7"  (1.702 m)   Wt 186 lb (84.4 kg)   SpO2 98%   BMI 29.13 kg/m    Physical Exam Vitals and nursing note reviewed.  Constitutional:      General: She is not in acute distress.    Appearance: Normal appearance. She is well-developed. She is not diaphoretic.  HENT:     Head: Normocephalic and atraumatic.     Mouth/Throat:     Pharynx: No oropharyngeal exudate.  Eyes:     Pupils: Pupils are equal, round, and reactive to light.  Neck:     Thyroid: No thyromegaly.     Vascular: No JVD.     Trachea: No tracheal deviation.  Cardiovascular:     Rate and Rhythm: Normal rate and regular rhythm.     Heart sounds: Normal heart sounds. No murmur heard.    No friction rub. No gallop.  Pulmonary:     Effort: Pulmonary effort is normal. No respiratory distress.     Breath sounds: Normal breath sounds. No wheezing or rales.  Chest:     Chest wall: No tenderness.  Abdominal:     General: Bowel sounds are normal.     Palpations: Abdomen is soft.  Musculoskeletal:        General: Normal range of motion.     Cervical back: Normal range of motion and neck supple.  Right lower leg: No edema.     Left lower leg:  No edema.  Lymphadenopathy:     Cervical: No cervical adenopathy.  Skin:    General: Skin is warm and dry.  Neurological:     Mental Status: She is alert and oriented to person, place, and time.     Cranial Nerves: No cranial nerve deficit.  Psychiatric:        Behavior: Behavior normal.        Thought Content: Thought content normal.        Judgment: Judgment normal.        Assessment/Plan: 1. Anemia in chronic kidney disease, on chronic dialysis [facility]) Will refer to hematology for further evaluation, also followed by nephrology - Ambulatory referral to Hematology / Oncology  2. History of breast cancer in female Likely due to d/c anastrozole, but pt insistent she was told to continue beyond 5 years therefore will refer to hematology - Ambulatory referral to Hematology / Oncology  3. Primary hypertension Improved, continue current medications   General Counseling: keisi hoven understanding of the findings of todays visit and agrees with plan of treatment. I have discussed any further diagnostic evaluation that may be needed or ordered today. We also reviewed her medications today. she has been encouraged to call the office with any questions or concerns that should arise related to todays visit.    Orders Placed This Encounter  Procedures   Ambulatory referral to Hematology / Oncology    No orders of the defined types were placed in this encounter.   This patient was seen by Lynn Ito, PA-C in collaboration with Dr. Beverely Risen as a part of collaborative care agreement.   Total time spent:30 Minutes Time spent includes review of chart, medications, test results, and follow up plan with the patient.      Dr Lyndon Code Internal medicine

## 2022-08-04 DIAGNOSIS — Z992 Dependence on renal dialysis: Secondary | ICD-10-CM | POA: Diagnosis not present

## 2022-08-04 DIAGNOSIS — N2581 Secondary hyperparathyroidism of renal origin: Secondary | ICD-10-CM | POA: Diagnosis not present

## 2022-08-04 DIAGNOSIS — N186 End stage renal disease: Secondary | ICD-10-CM | POA: Diagnosis not present

## 2022-08-07 DIAGNOSIS — Z992 Dependence on renal dialysis: Secondary | ICD-10-CM | POA: Diagnosis not present

## 2022-08-07 DIAGNOSIS — N186 End stage renal disease: Secondary | ICD-10-CM | POA: Diagnosis not present

## 2022-08-07 DIAGNOSIS — N2581 Secondary hyperparathyroidism of renal origin: Secondary | ICD-10-CM | POA: Diagnosis not present

## 2022-08-08 DIAGNOSIS — Z992 Dependence on renal dialysis: Secondary | ICD-10-CM | POA: Diagnosis not present

## 2022-08-08 DIAGNOSIS — N186 End stage renal disease: Secondary | ICD-10-CM | POA: Diagnosis not present

## 2022-08-09 DIAGNOSIS — Z992 Dependence on renal dialysis: Secondary | ICD-10-CM | POA: Diagnosis not present

## 2022-08-09 DIAGNOSIS — N2581 Secondary hyperparathyroidism of renal origin: Secondary | ICD-10-CM | POA: Diagnosis not present

## 2022-08-09 DIAGNOSIS — N186 End stage renal disease: Secondary | ICD-10-CM | POA: Diagnosis not present

## 2022-08-10 ENCOUNTER — Inpatient Hospital Stay: Payer: Medicare HMO

## 2022-08-10 ENCOUNTER — Inpatient Hospital Stay: Payer: Medicare HMO | Attending: Internal Medicine | Admitting: Internal Medicine

## 2022-08-10 VITALS — BP 184/79 | HR 77 | Temp 98.1°F | Wt 181.0 lb

## 2022-08-10 DIAGNOSIS — D631 Anemia in chronic kidney disease: Secondary | ICD-10-CM | POA: Diagnosis not present

## 2022-08-10 DIAGNOSIS — Z853 Personal history of malignant neoplasm of breast: Secondary | ICD-10-CM

## 2022-08-10 DIAGNOSIS — Z79899 Other long term (current) drug therapy: Secondary | ICD-10-CM | POA: Insufficient documentation

## 2022-08-10 DIAGNOSIS — E1122 Type 2 diabetes mellitus with diabetic chronic kidney disease: Secondary | ICD-10-CM | POA: Insufficient documentation

## 2022-08-10 DIAGNOSIS — Z8249 Family history of ischemic heart disease and other diseases of the circulatory system: Secondary | ICD-10-CM | POA: Insufficient documentation

## 2022-08-10 DIAGNOSIS — C50112 Malignant neoplasm of central portion of left female breast: Secondary | ICD-10-CM | POA: Insufficient documentation

## 2022-08-10 DIAGNOSIS — I120 Hypertensive chronic kidney disease with stage 5 chronic kidney disease or end stage renal disease: Secondary | ICD-10-CM | POA: Diagnosis not present

## 2022-08-10 DIAGNOSIS — Z833 Family history of diabetes mellitus: Secondary | ICD-10-CM | POA: Insufficient documentation

## 2022-08-10 DIAGNOSIS — L930 Discoid lupus erythematosus: Secondary | ICD-10-CM | POA: Diagnosis not present

## 2022-08-10 DIAGNOSIS — Z992 Dependence on renal dialysis: Secondary | ICD-10-CM | POA: Insufficient documentation

## 2022-08-10 DIAGNOSIS — Z7902 Long term (current) use of antithrombotics/antiplatelets: Secondary | ICD-10-CM | POA: Diagnosis not present

## 2022-08-10 DIAGNOSIS — E611 Iron deficiency: Secondary | ICD-10-CM | POA: Insufficient documentation

## 2022-08-10 DIAGNOSIS — Z87891 Personal history of nicotine dependence: Secondary | ICD-10-CM | POA: Diagnosis not present

## 2022-08-10 DIAGNOSIS — D509 Iron deficiency anemia, unspecified: Secondary | ICD-10-CM

## 2022-08-10 DIAGNOSIS — Z79811 Long term (current) use of aromatase inhibitors: Secondary | ICD-10-CM | POA: Diagnosis not present

## 2022-08-10 DIAGNOSIS — Z806 Family history of leukemia: Secondary | ICD-10-CM | POA: Insufficient documentation

## 2022-08-10 DIAGNOSIS — Z8673 Personal history of transient ischemic attack (TIA), and cerebral infarction without residual deficits: Secondary | ICD-10-CM | POA: Diagnosis not present

## 2022-08-10 DIAGNOSIS — Z801 Family history of malignant neoplasm of trachea, bronchus and lung: Secondary | ICD-10-CM | POA: Diagnosis not present

## 2022-08-10 DIAGNOSIS — N186 End stage renal disease: Secondary | ICD-10-CM | POA: Diagnosis not present

## 2022-08-10 DIAGNOSIS — Z170 Estrogen receptor positive status [ER+]: Secondary | ICD-10-CM | POA: Diagnosis not present

## 2022-08-10 NOTE — Progress Notes (Unsigned)
Patient states that she has shortness of breath.

## 2022-08-10 NOTE — Patient Instructions (Signed)
You can stop Arimidex 1 mg once daily starting next week. This is a pill for breast cancer.

## 2022-08-11 DIAGNOSIS — N186 End stage renal disease: Secondary | ICD-10-CM | POA: Diagnosis not present

## 2022-08-11 DIAGNOSIS — N2581 Secondary hyperparathyroidism of renal origin: Secondary | ICD-10-CM | POA: Diagnosis not present

## 2022-08-11 DIAGNOSIS — Z992 Dependence on renal dialysis: Secondary | ICD-10-CM | POA: Diagnosis not present

## 2022-08-13 ENCOUNTER — Encounter: Payer: Self-pay | Admitting: Internal Medicine

## 2022-08-13 NOTE — Progress Notes (Signed)
[facility]  [phone] [phone]  ID: Unknown Foley OB: [birth year]  MR#: [id number]  XBM#:[id number]  Patient Care Team: Alan Ripper as PCP - General (Physician Assistant) Erroll Luna, [facility] (Inactive) as Pharmacist (Pharmacist) Otho Ket, RN as [facility] Care Management Michaelyn Barter, MD as Consulting Physician (Oncology)  REFERRING PROVIDER: Lynn Ito  REASON FOR REFERRAL: anemia of chronic disease  HPI: Jennifer Carrillo is a 89 y.o. female with past medical history of hypertension, hyperlipidemia, IDA, COPD, diabetes, left breast cancer, OSA not on CPAP, stroke, ESRD on HD was referred to hematology for normocytic anemia.  Patient has history of ESRD on HD Tuesday Thursday and Saturday regimen.  Follows with Dr. Thedore Mins as outpatient.  She has been getting EPO injection and iron infusions per nephrology.  Labs from June 2024 showed hemoglobin of 8.1, WBC 6.3 and platelet 300.  Iron 32, saturation 12% and ferritin of 273, B12 523, folate 15.8, TSH 2.25.  Patient has history of left breast cancer.  Oncology history obtained from Dr. Cathi Roan [facility] heme-onc.  Last seen in March 2021.  Oncology History Overview Note  11/10/15: Mammogram with 2 cm left breast abnormality; [place] confirmed. Axillary negative.  12/07/15: Bx : Breast, left, 6 o'clock, core biopsy  - Invasive ductal carcinoma - Nottingham combined histologic grade: 1 Tubule formation: 2 Nuclear grade: 2 Mitotic score: 1 - Invasive carcinoma measures approximately 9 mm in this core biopsy specimen - Microcalcifications are associated with invasive carcinoma - ER 100%; PR 1-2%; Her 2+, FISH negative   12/20/15: Partial Mastectomy A: Breast, left savi guided partial mastectomy -Invasive ductal carcinoma with associated ductal carcinoma in situ (DCIS) (see synoptic) -Invasive carcinoma measures 14.0 mm in greatest  dimension -Margin status in part A only (correlate with additional parts to determine final margin status)      - Adenocarcinoma is greater than 2 mm from all surgical margins      - DCIS is focally positive at the posterior black-inked surgical margin; DCIS is greater than 2 mm from all other surgical margins -Ancillary studies previously reported on       - Estrogen receptor: Positive, 91-100%, strong      - Progesterone receptor: Margins positive, 1-10%, moderate      - HER2/Neu: 2+ equivocal FISH results HER2/Neu: Not amplified -See comment   B: Breast, left, posterior margin, excision - Ductal carcinoma in-situ present, 6.0 mm from the black-inked surgical margin - Negative for invasive carcinoma   C: Breast, left, medial margin, excision - Negative for in-situ and invasive carcinoma   D: Breast, left, inferior margin, excision - Negative for in-situ and invasive carcinoma   E: Breast, left, lateral margin, excision - Negative for in-situ and invasive carcinoma   F: Breast, left, superior margin, excision - Ductal carcinoma in-situ is 1.7 mm from the blue-inked surgical margin - Negative for invasive carcinoma  Malignant neoplasm of left female breast (CMS-HCC)  12/16/2015 Initial Diagnosis  Malignant neoplasm of breast in female, estrogen receptor positive (RAF-HCC)   On anastrozole 1 mg daily   REVIEW OF SYSTEMS:   ROS  As per HPI. Otherwise, a complete review of systems is negative.  PAST MEDICAL HISTORY: Past Medical History:  Diagnosis Date   Arthritis    Asthma    Calf pain    Cancer (HCC) 2016   left breast   COPD (chronic obstructive pulmonary disease) (HCC)    Diabetes (HCC)  Discoid lupus    Gastro-esophageal reflux    Glaucoma    Hyperlipemia    Hypertension    Iron deficiency anemia    Joint pain    Leg swelling    Osteoarthritis    PVD (peripheral vascular disease) (HCC)    Sinus problem    Sleep apnea    No CPAP/ Can't tolerate    Stroke (HCC) 1987    PAST SURGICAL HISTORY: Past Surgical History:  Procedure Laterality Date   BREAST SURGERY Left    left lumpectomy   CAROTID ANGIOGRAPHY Left 06/25/2018   Procedure: CAROTID ANGIOGRAPHY, possible intervention;  Surgeon: Renford Dills, MD;  Location: [facility] INVASIVE CV LAB;  Service: Cardiovascular;  Laterality: Left;   CAROTID ENDARTERECTOMY Right    CAROTID PTA/STENT INTERVENTION Left 06/25/2018   Procedure: CAROTID PTA/STENT INTERVENTION;  Surgeon: Renford Dills, MD;  Location: [facility] INVASIVE CV LAB;  Service: Cardiovascular;  Laterality: Left;   CATARACT EXTRACTION Right 2013   CORONARY STENT PLACEMENT     L. L. E.   DIALYSIS/PERMA CATHETER INSERTION N/A 12/29/2020   Procedure: DIALYSIS/PERMA CATHETER INSERTION;  Surgeon: Annice Needy, MD;  Location: [facility] INVASIVE CV LAB;  Service: Cardiovascular;  Laterality: N/A;   DIALYSIS/PERMA CATHETER INSERTION N/A 12/14/2021   Procedure: DIALYSIS/PERMA CATHETER INSERTION;  Surgeon: Renford Dills, MD;  Location: [facility] INVASIVE CV LAB;  Service: Cardiovascular;  Laterality: N/A;   EYE SURGERY Bilateral    cataract   LEFT HEART CATH AND CORONARY ANGIOGRAPHY Left 05/06/2018   Procedure: LEFT HEART CATH AND CORONARY ANGIOGRAPHY;  Surgeon: Lamar Blinks, MD;  Location: [facility] INVASIVE CV LAB;  Service: Cardiovascular;  Laterality: Left;   LOWER EXTREMITY ANGIOGRAPHY Right 01/22/2018   Procedure: LOWER EXTREMITY ANGIOGRAPHY;  Surgeon: Renford Dills, MD;  Location: [facility] INVASIVE CV LAB;  Service: Cardiovascular;  Laterality: Right;   RENAL ANGIOGRAPHY Left 02/26/2018   Procedure: RENAL ANGIOGRAPHY;  Surgeon: Renford Dills, MD;  Location: [facility] INVASIVE CV LAB;  Service: Cardiovascular;  Laterality: Left;   STENT PLACEMENT VASCULAR ([facility] HX) Left    stent placement on LLE   TUBAL LIGATION      FAMILY HISTORY: Family History  Problem Relation Age of Onset   Heart disease Mother    Diabetes Sister    Leukemia  Daughter    Lung cancer Brother    Diabetes Other    Hypertension Other    Bladder Cancer Neg Hx    Kidney disease Neg Hx    Prostate cancer Neg Hx    Breast cancer Neg Hx     HEALTH MAINTENANCE: Social History   Tobacco Use   Smoking status: Former    Current packs/day: 0.00    Types: Cigarettes    Quit date: 1987    Years since quitting: 37.6   Smokeless tobacco: Never   Tobacco comments:    quit 31 years  Vaping Use   Vaping status: Never Used  Substance Use Topics   Alcohol use: No   Drug use: No     Allergies  Allergen Reactions   Benazepril Nausea Only    Current Outpatient Medications  Medication Sig Dispense Refill   ACCU-CHEK GUIDE test strip TEST BLOOD SUGAR THREE TIMES DAILY  AS  DIRECTED 300 strip 1   Accu-Chek Softclix Lancets lancets USE AS INSTRUCTED 3 TIMES A DAY 300 each 3   acetaminophen (TYLENOL) 500 MG tablet Take 1 tablet (500 mg total) by mouth every 6 (six) hours as needed  for moderate pain or headache. 30 tablet 0   Alcohol Swabs (B-D SINGLE USE SWABS REGULAR) PADS Use as directed for 3 times daily DX E11.65 100 each 1   amLODipine (NORVASC) 10 MG tablet TAKE 1 TABLET EVERY DAY AT BREAKFAST 90 tablet 1   anastrozole (ARIMIDEX) 1 MG tablet TAKE 1 TABLET EVERY DAY IN THE AFTERNOON (DINNER) 90 tablet 0   atorvastatin (LIPITOR) 40 MG tablet Take 40 mg by mouth daily.     Blood Glucose Monitoring Suppl (ACCU-CHEK GUIDE ME) w/Device KIT Use as instructed. DX e11.65 1 kit 0   clopidogrel (PLAVIX) 75 MG tablet TAKE 1 TABLET DAILY AT DINNER 90 tablet 1   docusate sodium (COLACE) 100 MG capsule Take 200 mg by mouth at bedtime as needed for mild constipation.     donepezil (ARICEPT) 10 MG tablet TAKE 1 TABLET AT BEDTIME FOR MEMORY (NEED MD APPOINTMENT) 90 tablet 1   ergocalciferol (VITAMIN D2) 1.25 MG (50000 UT) capsule Take 1 capsule (50,000 Units total) by mouth once a week. 12 capsule 3   fluticasone-salmeterol (ADVAIR) 100-50 MCG/ACT AEPB Inhale 1  puff into the lungs 2 (two) times daily. 1 each 3   insulin NPH-regular Human (70-30) 100 UNIT/ML injection Inject 12 Units into the skin 2 (two) times daily. Inject 20 units subcutaneously in the morning and 20 units in the evening. (Patient taking differently: Inject into the skin daily. Use as directed 1-2 units as needed if glucose is fasting glucose 150) 10 mL 11   isosorbide mononitrate (IMDUR) 60 MG 24 hr tablet Take 60 mg by mouth daily.     ondansetron (ZOFRAN) 4 MG tablet Take 1 tablet (4 mg total) by mouth every 6 (six) hours as needed for nausea. 20 tablet 0   hydrALAZINE (APRESOLINE) 50 MG tablet Take 100 mg by mouth in the morning and at bedtime. Take one tablet in AM and two tablets in PM     No current facility-administered medications for this visit.    OBJECTIVE: Vitals:   08/10/22 1057 08/10/22 1059  BP: (!) 181/83 (!) 184/79  Pulse: 77   Temp: 98.1 F (36.7 C)   SpO2: 97%      Body mass index is 28.35 kg/m.      General: Well-developed, well-nourished, no acute distress. Eyes: Pink conjunctiva, anicteric sclera. HEENT: Normocephalic, moist mucous membranes, clear oropharnyx. Lungs: Clear to auscultation bilaterally. Heart: Regular rate and rhythm. No rubs, murmurs, or gallops. Abdomen: Soft, nontender, nondistended. No organomegaly noted, normoactive bowel sounds. Musculoskeletal: No edema, cyanosis, or clubbing. Neuro: Alert, answering all questions appropriately. Cranial nerves grossly intact. Skin: No rashes or petechiae noted. Psych: Normal affect. Lymphatics: No cervical, calvicular, axillary or inguinal LAD.   LAB RESULTS:  Lab Results  Component Value Date   NA 139 01/04/2022   K 3.9 01/04/2022   CL 107 01/04/2022   CO2 17 (L) 01/04/2022   GLUCOSE 159 (H) 01/04/2022   BUN 24 (H) 01/04/2022   CREATININE 2.95 (H) 01/04/2022   CALCIUM 9.6 01/04/2022   PROT 7.5 10/28/2021   ALBUMIN 4.0 10/28/2021   AST 32 10/28/2021   ALT 21 10/28/2021   ALKPHOS  100 10/28/2021   BILITOT 0.3 10/28/2021   GFRNONAA 15 (L) 01/04/2022   GFRAA 31 (L) 02/13/2019    Lab Results  Component Value Date   WBC 6.3 06/18/2022   NEUTROABS 4.1 06/18/2022   HGB 8.1 (L) 06/18/2022   HCT 26.3 (L) 06/18/2022   MCV 81 06/18/2022  PLT 300 06/18/2022    Lab Results  Component Value Date   TIBC 257 06/18/2022   TIBC 238 (L) 08/12/2020   TIBC 314 07/15/2019   FERRITIN 273 (H) 06/18/2022   FERRITIN 152 (H) 08/12/2020   FERRITIN 172 (H) 07/15/2019   IRONPCTSAT 12 (L) 06/18/2022   IRONPCTSAT 8 (LL) 08/12/2020   IRONPCTSAT 12 (L) 07/15/2019     STUDIES: No results found.  ASSESSMENT AND PLAN:   Jennifer Carrillo is a 89 y.o. female with pmh of hypertension, hyperlipidemia, IDA, COPD, diabetes, left breast cancer, OSA not on CPAP, stroke, ESRD on HD was referred to hematology for normocytic anemia.  # Normocytic anemia # Iron deficiency # ESRD on HD -Patient follows with Dr. Thedore Mins nephrology as outpatient.  She is on dialysis Tuesday Thursday and Saturday.  Patient has been getting EPO injections and IV iron infusions per nephrology.  Considering patient is on dialysis will defer further management of anemia to nephrology.  Case was discussed with Dr. Thedore Mins.  # History of left breast cancer, stage Ia, ER 91 to 100% positive, 1 to 10% positive and HER2 negative. -Diagnosed in 2017.  Status post left lumpectomy at [facility].  Pathology showing IDC, 14 mm, margins negative for carcinoma, DCIS focally present at the posterior black inked margin.  ER 91 to 100% positive, PR 1 to 10% and HER2 negative by FISH.  She was treated by Dr. Cathi Roan [facility]onc at [facility].  Last followed up in March 2021.  She is on anastrozole 1 mg daily since beginning of 2018.  Discussed with the patient that she had early stage hormone positive breast cancer with negative margins for carcinoma.  5 years of anastrozole should be sufficient.  She is close to finishing 7 years of anastrozole.   I advised her to discontinue taking it.  She would be at a higher risk of bone related complications.  -Last mammogram was in September 2023 which was negative.  Will schedule screening mammogram for September 2024.  RTC in 2 months for MD visit to discuss mammogram.  Patient expressed understanding and was in agreement with this plan. She also understands that She can call clinic at any time with any questions, concerns, or complaints.   I spent a total of 45 minutes reviewing chart data, face-to-face evaluation with the patient, counseling and coordination of care as detailed above.  Michaelyn Barter, MD   08/13/2022 1:01 PM

## 2022-08-14 DIAGNOSIS — Z992 Dependence on renal dialysis: Secondary | ICD-10-CM | POA: Diagnosis not present

## 2022-08-14 DIAGNOSIS — N2581 Secondary hyperparathyroidism of renal origin: Secondary | ICD-10-CM | POA: Diagnosis not present

## 2022-08-14 DIAGNOSIS — N186 End stage renal disease: Secondary | ICD-10-CM | POA: Diagnosis not present

## 2022-08-15 ENCOUNTER — Telehealth: Payer: Self-pay | Admitting: Physician Assistant

## 2022-08-15 NOTE — Telephone Encounter (Signed)
Notified Sarah with [facility] of overnight pulseox order-Toni

## 2022-08-16 ENCOUNTER — Other Ambulatory Visit: Payer: Self-pay | Admitting: Physician Assistant

## 2022-08-16 DIAGNOSIS — G4734 Idiopathic sleep related nonobstructive alveolar hypoventilation: Secondary | ICD-10-CM

## 2022-08-16 DIAGNOSIS — N186 End stage renal disease: Secondary | ICD-10-CM | POA: Diagnosis not present

## 2022-08-16 DIAGNOSIS — R0602 Shortness of breath: Secondary | ICD-10-CM

## 2022-08-16 DIAGNOSIS — Z992 Dependence on renal dialysis: Secondary | ICD-10-CM | POA: Diagnosis not present

## 2022-08-16 DIAGNOSIS — N2581 Secondary hyperparathyroidism of renal origin: Secondary | ICD-10-CM | POA: Diagnosis not present

## 2022-08-18 DIAGNOSIS — Z992 Dependence on renal dialysis: Secondary | ICD-10-CM | POA: Diagnosis not present

## 2022-08-18 DIAGNOSIS — N186 End stage renal disease: Secondary | ICD-10-CM | POA: Diagnosis not present

## 2022-08-18 DIAGNOSIS — N2581 Secondary hyperparathyroidism of renal origin: Secondary | ICD-10-CM | POA: Diagnosis not present

## 2022-08-21 DIAGNOSIS — N186 End stage renal disease: Secondary | ICD-10-CM | POA: Diagnosis not present

## 2022-08-21 DIAGNOSIS — Z992 Dependence on renal dialysis: Secondary | ICD-10-CM | POA: Diagnosis not present

## 2022-08-21 DIAGNOSIS — N2581 Secondary hyperparathyroidism of renal origin: Secondary | ICD-10-CM | POA: Diagnosis not present

## 2022-08-23 DIAGNOSIS — N2581 Secondary hyperparathyroidism of renal origin: Secondary | ICD-10-CM | POA: Diagnosis not present

## 2022-08-23 DIAGNOSIS — N186 End stage renal disease: Secondary | ICD-10-CM | POA: Diagnosis not present

## 2022-08-23 DIAGNOSIS — Z992 Dependence on renal dialysis: Secondary | ICD-10-CM | POA: Diagnosis not present

## 2022-08-25 DIAGNOSIS — N186 End stage renal disease: Secondary | ICD-10-CM | POA: Diagnosis not present

## 2022-08-25 DIAGNOSIS — Z992 Dependence on renal dialysis: Secondary | ICD-10-CM | POA: Diagnosis not present

## 2022-08-25 DIAGNOSIS — N2581 Secondary hyperparathyroidism of renal origin: Secondary | ICD-10-CM | POA: Diagnosis not present

## 2022-08-28 DIAGNOSIS — R0602 Shortness of breath: Secondary | ICD-10-CM | POA: Diagnosis not present

## 2022-08-28 DIAGNOSIS — Z992 Dependence on renal dialysis: Secondary | ICD-10-CM | POA: Diagnosis not present

## 2022-08-28 DIAGNOSIS — N186 End stage renal disease: Secondary | ICD-10-CM | POA: Diagnosis not present

## 2022-08-28 DIAGNOSIS — N2581 Secondary hyperparathyroidism of renal origin: Secondary | ICD-10-CM | POA: Diagnosis not present

## 2022-08-30 ENCOUNTER — Telehealth: Payer: Self-pay

## 2022-08-30 ENCOUNTER — Other Ambulatory Visit: Payer: Self-pay | Admitting: Physician Assistant

## 2022-08-30 DIAGNOSIS — N186 End stage renal disease: Secondary | ICD-10-CM | POA: Diagnosis not present

## 2022-08-30 DIAGNOSIS — Z992 Dependence on renal dialysis: Secondary | ICD-10-CM | POA: Diagnosis not present

## 2022-08-30 DIAGNOSIS — N2581 Secondary hyperparathyroidism of renal origin: Secondary | ICD-10-CM | POA: Diagnosis not present

## 2022-08-30 DIAGNOSIS — G4734 Idiopathic sleep related nonobstructive alveolar hypoventilation: Secondary | ICD-10-CM

## 2022-08-30 NOTE — Telephone Encounter (Signed)
Sent message to [employer] for patient's overnight oxygen order.

## 2022-08-31 ENCOUNTER — Telehealth: Payer: Self-pay

## 2022-08-31 NOTE — Telephone Encounter (Signed)
Spoke with [employer] they will delivered today her oxygen

## 2022-09-01 DIAGNOSIS — N2581 Secondary hyperparathyroidism of renal origin: Secondary | ICD-10-CM | POA: Diagnosis not present

## 2022-09-01 DIAGNOSIS — Z992 Dependence on renal dialysis: Secondary | ICD-10-CM | POA: Diagnosis not present

## 2022-09-01 DIAGNOSIS — N186 End stage renal disease: Secondary | ICD-10-CM | POA: Diagnosis not present

## 2022-09-03 ENCOUNTER — Telehealth: Payer: Self-pay

## 2022-09-03 NOTE — Telephone Encounter (Signed)
Spoke with pt husband they received oxygen

## 2022-09-04 DIAGNOSIS — Z992 Dependence on renal dialysis: Secondary | ICD-10-CM | POA: Diagnosis not present

## 2022-09-04 DIAGNOSIS — N2581 Secondary hyperparathyroidism of renal origin: Secondary | ICD-10-CM | POA: Diagnosis not present

## 2022-09-04 DIAGNOSIS — N186 End stage renal disease: Secondary | ICD-10-CM | POA: Diagnosis not present

## 2022-09-06 DIAGNOSIS — N2581 Secondary hyperparathyroidism of renal origin: Secondary | ICD-10-CM | POA: Diagnosis not present

## 2022-09-06 DIAGNOSIS — Z992 Dependence on renal dialysis: Secondary | ICD-10-CM | POA: Diagnosis not present

## 2022-09-06 DIAGNOSIS — N186 End stage renal disease: Secondary | ICD-10-CM | POA: Diagnosis not present

## 2022-09-08 DIAGNOSIS — N186 End stage renal disease: Secondary | ICD-10-CM | POA: Diagnosis not present

## 2022-09-08 DIAGNOSIS — Z992 Dependence on renal dialysis: Secondary | ICD-10-CM | POA: Diagnosis not present

## 2022-09-08 DIAGNOSIS — N2581 Secondary hyperparathyroidism of renal origin: Secondary | ICD-10-CM | POA: Diagnosis not present

## 2022-09-11 DIAGNOSIS — N2581 Secondary hyperparathyroidism of renal origin: Secondary | ICD-10-CM | POA: Diagnosis not present

## 2022-09-11 DIAGNOSIS — N186 End stage renal disease: Secondary | ICD-10-CM | POA: Diagnosis not present

## 2022-09-11 DIAGNOSIS — Z992 Dependence on renal dialysis: Secondary | ICD-10-CM | POA: Diagnosis not present

## 2022-09-13 DIAGNOSIS — N2581 Secondary hyperparathyroidism of renal origin: Secondary | ICD-10-CM | POA: Diagnosis not present

## 2022-09-13 DIAGNOSIS — Z992 Dependence on renal dialysis: Secondary | ICD-10-CM | POA: Diagnosis not present

## 2022-09-13 DIAGNOSIS — N186 End stage renal disease: Secondary | ICD-10-CM | POA: Diagnosis not present

## 2022-09-15 DIAGNOSIS — Z992 Dependence on renal dialysis: Secondary | ICD-10-CM | POA: Diagnosis not present

## 2022-09-15 DIAGNOSIS — N2581 Secondary hyperparathyroidism of renal origin: Secondary | ICD-10-CM | POA: Diagnosis not present

## 2022-09-15 DIAGNOSIS — N186 End stage renal disease: Secondary | ICD-10-CM | POA: Diagnosis not present

## 2022-09-17 ENCOUNTER — Encounter: Payer: Self-pay | Admitting: Internal Medicine

## 2022-09-18 DIAGNOSIS — Z992 Dependence on renal dialysis: Secondary | ICD-10-CM | POA: Diagnosis not present

## 2022-09-18 DIAGNOSIS — N2581 Secondary hyperparathyroidism of renal origin: Secondary | ICD-10-CM | POA: Diagnosis not present

## 2022-09-18 DIAGNOSIS — N186 End stage renal disease: Secondary | ICD-10-CM | POA: Diagnosis not present

## 2022-09-20 ENCOUNTER — Telehealth: Payer: Self-pay

## 2022-09-20 DIAGNOSIS — Z992 Dependence on renal dialysis: Secondary | ICD-10-CM | POA: Diagnosis not present

## 2022-09-20 DIAGNOSIS — N186 End stage renal disease: Secondary | ICD-10-CM

## 2022-09-20 DIAGNOSIS — N2581 Secondary hyperparathyroidism of renal origin: Secondary | ICD-10-CM | POA: Diagnosis not present

## 2022-09-21 ENCOUNTER — Telehealth: Payer: Self-pay | Admitting: *Deleted

## 2022-09-21 NOTE — Progress Notes (Unsigned)
Care Coordination  Outreach Note  09/21/2022 Name: HERA DEXHEIMER MRN: [id number] DOB: 08/24/1933   Care Coordination Outreach Attempts: An unsuccessful telephone outreach was attempted today to offer the patient information about available care coordination services.  Follow Up Plan:  Additional outreach attempts will be made to offer the patient care coordination information and services.   Encounter Outcome:  No Answer  Burman Nieves, CCMA Care Coordination Care Guide Direct Dial: [phone]

## 2022-09-22 DIAGNOSIS — Z992 Dependence on renal dialysis: Secondary | ICD-10-CM | POA: Diagnosis not present

## 2022-09-22 DIAGNOSIS — N186 End stage renal disease: Secondary | ICD-10-CM | POA: Diagnosis not present

## 2022-09-22 DIAGNOSIS — N2581 Secondary hyperparathyroidism of renal origin: Secondary | ICD-10-CM | POA: Diagnosis not present

## 2022-09-24 ENCOUNTER — Ambulatory Visit
Admission: RE | Admit: 2022-09-24 | Discharge: 2022-09-24 | Disposition: A | Discharge status: Home / Self Care | Payer: Medicare HMO | Source: Ambulatory Visit | Attending: Internal Medicine | Admitting: Internal Medicine

## 2022-09-24 DIAGNOSIS — Z1231 Encounter for screening mammogram for malignant neoplasm of breast: Secondary | ICD-10-CM | POA: Diagnosis not present

## 2022-09-24 DIAGNOSIS — Z853 Personal history of malignant neoplasm of breast: Secondary | ICD-10-CM | POA: Diagnosis not present

## 2022-09-24 NOTE — Progress Notes (Signed)
Care Coordination   Note   09/24/2022 Name: Jennifer Carrillo MRN: [id number] DOB: Oct 12, 1933  Jennifer Carrillo is a 89 y.o. year old female who sees McDonough, Salomon Fick, PA-C for primary care. I reached out to Jennifer Carrillo by phone today to offer care coordination services.  [name] was given information about Care Coordination services today including:   The Care Coordination services include support from the care team which includes your Nurse Coordinator, Clinical Social Worker, or Pharmacist.  The Care Coordination team is here to help remove barriers to the health concerns and goals most important to you. Care Coordination services are voluntary, and the patient may decline or stop services at any time by request to their care team member.   Care Coordination Consent Status: Patient agreed to services and verbal consent obtained.   Follow up plan:  Telephone appointment with care coordination team member scheduled for:  09/26/2022  Encounter Outcome:  Patient Scheduled from referral   Burman Nieves, [facility] Care Coordination Care Guide Direct Dial: [phone]

## 2022-09-25 DIAGNOSIS — N186 End stage renal disease: Secondary | ICD-10-CM | POA: Diagnosis not present

## 2022-09-25 DIAGNOSIS — Z992 Dependence on renal dialysis: Secondary | ICD-10-CM | POA: Diagnosis not present

## 2022-09-25 DIAGNOSIS — N2581 Secondary hyperparathyroidism of renal origin: Secondary | ICD-10-CM | POA: Diagnosis not present

## 2022-09-26 ENCOUNTER — Ambulatory Visit: Payer: Self-pay

## 2022-09-26 NOTE — Patient Outreach (Signed)
Care Coordination   Initial Visit Note   09/26/2022 Name: Jennifer Carrillo MRN: [id number] DOB: 26-May-1933  Jennifer Carrillo is a 89 y.o. year old female who sees McDonough, Salomon Fick, PA-C for primary care. I spoke with  Jennifer Carrillo and daughter/ designated party release, Jennifer Carrillo by phone today.  What matters to the patients health and wellness today?  Daughter denies patient having any nursing or community needs at this time.  She states she and her family are handling Jennifer Carrillo's care and does not see the need for care coordination services at this time.     Goals Addressed             This Visit's Progress    Care coordination activities - no follow up needed       Interventions Today    Flowsheet Row Most Recent Value  General Interventions   General Interventions Discussed/Reviewed General Interventions Discussed  [Care coordination services discussed. SDOH survey completed. AWV discussed and patient advised to contact provider office to schedule. . Advised to contact PCP office if care coordination services needed in the future.]              SDOH assessments and interventions completed:  Yes  SDOH Interventions Today    Flowsheet Row Most Recent Value  SDOH Interventions   Food Insecurity Interventions Intervention Not Indicated  Housing Interventions Intervention Not Indicated  Transportation Interventions Intervention Not Indicated        Care Coordination Interventions:  Yes, provided   Follow up plan: No further intervention required.   Encounter Outcome:  Patient Visit Completed   [facility] Care Coordination [phone] direct line

## 2022-09-27 DIAGNOSIS — Z992 Dependence on renal dialysis: Secondary | ICD-10-CM | POA: Diagnosis not present

## 2022-09-27 DIAGNOSIS — N186 End stage renal disease: Secondary | ICD-10-CM | POA: Diagnosis not present

## 2022-09-27 DIAGNOSIS — N2581 Secondary hyperparathyroidism of renal origin: Secondary | ICD-10-CM | POA: Diagnosis not present

## 2022-09-29 DIAGNOSIS — Z992 Dependence on renal dialysis: Secondary | ICD-10-CM | POA: Diagnosis not present

## 2022-09-29 DIAGNOSIS — N2581 Secondary hyperparathyroidism of renal origin: Secondary | ICD-10-CM | POA: Diagnosis not present

## 2022-09-29 DIAGNOSIS — N186 End stage renal disease: Secondary | ICD-10-CM | POA: Diagnosis not present

## 2022-10-02 DIAGNOSIS — N186 End stage renal disease: Secondary | ICD-10-CM | POA: Diagnosis not present

## 2022-10-02 DIAGNOSIS — N2581 Secondary hyperparathyroidism of renal origin: Secondary | ICD-10-CM | POA: Diagnosis not present

## 2022-10-02 DIAGNOSIS — E119 Type 2 diabetes mellitus without complications: Secondary | ICD-10-CM | POA: Diagnosis not present

## 2022-10-02 DIAGNOSIS — Z992 Dependence on renal dialysis: Secondary | ICD-10-CM | POA: Diagnosis not present

## 2022-10-02 DIAGNOSIS — Z794 Long term (current) use of insulin: Secondary | ICD-10-CM | POA: Diagnosis not present

## 2022-10-04 DIAGNOSIS — N186 End stage renal disease: Secondary | ICD-10-CM | POA: Diagnosis not present

## 2022-10-04 DIAGNOSIS — N2581 Secondary hyperparathyroidism of renal origin: Secondary | ICD-10-CM | POA: Diagnosis not present

## 2022-10-04 DIAGNOSIS — Z992 Dependence on renal dialysis: Secondary | ICD-10-CM | POA: Diagnosis not present

## 2022-10-05 ENCOUNTER — Other Ambulatory Visit: Payer: Self-pay

## 2022-10-05 ENCOUNTER — Telehealth: Payer: Self-pay

## 2022-10-05 ENCOUNTER — Telehealth: Payer: Self-pay | Admitting: Physician Assistant

## 2022-10-05 NOTE — Telephone Encounter (Signed)
Spoke with pt daughter that  Patient taking hydralazine, daughter said dr Ezequiel Essex has prescribed her higher dose advised her to call dr Thedore Mins due she change med and if cannot get back with them by monday then call office same thing.

## 2022-10-05 NOTE — Telephone Encounter (Signed)
Lebron Conners, patient's daughter, called stating Lauren already has patient hydralazine, but Dr. Thedore Mins has also prescribed same medicine, but with higher dose. Sent secure chat message to Nimisha to return her call-Toni

## 2022-10-06 DIAGNOSIS — N186 End stage renal disease: Secondary | ICD-10-CM | POA: Diagnosis not present

## 2022-10-06 DIAGNOSIS — N2581 Secondary hyperparathyroidism of renal origin: Secondary | ICD-10-CM | POA: Diagnosis not present

## 2022-10-06 DIAGNOSIS — Z992 Dependence on renal dialysis: Secondary | ICD-10-CM | POA: Diagnosis not present

## 2022-10-08 ENCOUNTER — Inpatient Hospital Stay: Payer: Medicare HMO | Attending: Internal Medicine

## 2022-10-08 ENCOUNTER — Inpatient Hospital Stay: Payer: Medicare HMO | Admitting: Internal Medicine

## 2022-10-08 DIAGNOSIS — N186 End stage renal disease: Secondary | ICD-10-CM | POA: Diagnosis not present

## 2022-10-08 DIAGNOSIS — Z992 Dependence on renal dialysis: Secondary | ICD-10-CM | POA: Diagnosis not present

## 2022-10-09 ENCOUNTER — Ambulatory Visit: Payer: Medicare HMO | Admitting: Internal Medicine

## 2022-10-09 ENCOUNTER — Other Ambulatory Visit: Payer: Medicare HMO

## 2022-10-09 DIAGNOSIS — Z992 Dependence on renal dialysis: Secondary | ICD-10-CM | POA: Diagnosis not present

## 2022-10-09 DIAGNOSIS — N186 End stage renal disease: Secondary | ICD-10-CM | POA: Diagnosis not present

## 2022-10-09 DIAGNOSIS — N2581 Secondary hyperparathyroidism of renal origin: Secondary | ICD-10-CM | POA: Diagnosis not present

## 2022-10-11 DIAGNOSIS — N186 End stage renal disease: Secondary | ICD-10-CM | POA: Diagnosis not present

## 2022-10-11 DIAGNOSIS — Z992 Dependence on renal dialysis: Secondary | ICD-10-CM | POA: Diagnosis not present

## 2022-10-11 DIAGNOSIS — N2581 Secondary hyperparathyroidism of renal origin: Secondary | ICD-10-CM | POA: Diagnosis not present

## 2022-10-12 ENCOUNTER — Ambulatory Visit: Payer: Medicare HMO | Admitting: Internal Medicine

## 2022-10-12 ENCOUNTER — Other Ambulatory Visit: Payer: Medicare HMO

## 2022-10-13 DIAGNOSIS — N2581 Secondary hyperparathyroidism of renal origin: Secondary | ICD-10-CM | POA: Diagnosis not present

## 2022-10-13 DIAGNOSIS — Z992 Dependence on renal dialysis: Secondary | ICD-10-CM | POA: Diagnosis not present

## 2022-10-13 DIAGNOSIS — N186 End stage renal disease: Secondary | ICD-10-CM | POA: Diagnosis not present

## 2022-10-16 DIAGNOSIS — N186 End stage renal disease: Secondary | ICD-10-CM | POA: Diagnosis not present

## 2022-10-16 DIAGNOSIS — Z992 Dependence on renal dialysis: Secondary | ICD-10-CM | POA: Diagnosis not present

## 2022-10-16 DIAGNOSIS — N2581 Secondary hyperparathyroidism of renal origin: Secondary | ICD-10-CM | POA: Diagnosis not present

## 2022-10-18 DIAGNOSIS — N186 End stage renal disease: Secondary | ICD-10-CM | POA: Diagnosis not present

## 2022-10-18 DIAGNOSIS — Z992 Dependence on renal dialysis: Secondary | ICD-10-CM | POA: Diagnosis not present

## 2022-10-18 DIAGNOSIS — N2581 Secondary hyperparathyroidism of renal origin: Secondary | ICD-10-CM | POA: Diagnosis not present

## 2022-10-20 DIAGNOSIS — N186 End stage renal disease: Secondary | ICD-10-CM | POA: Diagnosis not present

## 2022-10-20 DIAGNOSIS — N2581 Secondary hyperparathyroidism of renal origin: Secondary | ICD-10-CM | POA: Diagnosis not present

## 2022-10-20 DIAGNOSIS — Z992 Dependence on renal dialysis: Secondary | ICD-10-CM | POA: Diagnosis not present

## 2022-10-23 DIAGNOSIS — Z992 Dependence on renal dialysis: Secondary | ICD-10-CM | POA: Diagnosis not present

## 2022-10-23 DIAGNOSIS — N2581 Secondary hyperparathyroidism of renal origin: Secondary | ICD-10-CM | POA: Diagnosis not present

## 2022-10-23 DIAGNOSIS — N186 End stage renal disease: Secondary | ICD-10-CM | POA: Diagnosis not present

## 2022-10-25 DIAGNOSIS — Z992 Dependence on renal dialysis: Secondary | ICD-10-CM | POA: Diagnosis not present

## 2022-10-25 DIAGNOSIS — N2581 Secondary hyperparathyroidism of renal origin: Secondary | ICD-10-CM | POA: Diagnosis not present

## 2022-10-25 DIAGNOSIS — N186 End stage renal disease: Secondary | ICD-10-CM | POA: Diagnosis not present

## 2022-10-27 DIAGNOSIS — N2581 Secondary hyperparathyroidism of renal origin: Secondary | ICD-10-CM | POA: Diagnosis not present

## 2022-10-27 DIAGNOSIS — Z992 Dependence on renal dialysis: Secondary | ICD-10-CM | POA: Diagnosis not present

## 2022-10-27 DIAGNOSIS — N186 End stage renal disease: Secondary | ICD-10-CM | POA: Diagnosis not present

## 2022-10-30 DIAGNOSIS — Z992 Dependence on renal dialysis: Secondary | ICD-10-CM | POA: Diagnosis not present

## 2022-10-30 DIAGNOSIS — N2581 Secondary hyperparathyroidism of renal origin: Secondary | ICD-10-CM | POA: Diagnosis not present

## 2022-10-30 DIAGNOSIS — N186 End stage renal disease: Secondary | ICD-10-CM | POA: Diagnosis not present

## 2022-11-01 DIAGNOSIS — N186 End stage renal disease: Secondary | ICD-10-CM | POA: Diagnosis not present

## 2022-11-01 DIAGNOSIS — Z992 Dependence on renal dialysis: Secondary | ICD-10-CM | POA: Diagnosis not present

## 2022-11-01 DIAGNOSIS — N2581 Secondary hyperparathyroidism of renal origin: Secondary | ICD-10-CM | POA: Diagnosis not present

## 2022-11-03 DIAGNOSIS — N186 End stage renal disease: Secondary | ICD-10-CM | POA: Diagnosis not present

## 2022-11-03 DIAGNOSIS — Z992 Dependence on renal dialysis: Secondary | ICD-10-CM | POA: Diagnosis not present

## 2022-11-03 DIAGNOSIS — N2581 Secondary hyperparathyroidism of renal origin: Secondary | ICD-10-CM | POA: Diagnosis not present

## 2022-11-06 DIAGNOSIS — Z992 Dependence on renal dialysis: Secondary | ICD-10-CM | POA: Diagnosis not present

## 2022-11-06 DIAGNOSIS — N186 End stage renal disease: Secondary | ICD-10-CM | POA: Diagnosis not present

## 2022-11-06 DIAGNOSIS — N2581 Secondary hyperparathyroidism of renal origin: Secondary | ICD-10-CM | POA: Diagnosis not present

## 2022-11-08 DIAGNOSIS — N2581 Secondary hyperparathyroidism of renal origin: Secondary | ICD-10-CM | POA: Diagnosis not present

## 2022-11-08 DIAGNOSIS — N186 End stage renal disease: Secondary | ICD-10-CM | POA: Diagnosis not present

## 2022-11-08 DIAGNOSIS — Z992 Dependence on renal dialysis: Secondary | ICD-10-CM | POA: Diagnosis not present

## 2022-11-10 DIAGNOSIS — Z992 Dependence on renal dialysis: Secondary | ICD-10-CM | POA: Diagnosis not present

## 2022-11-10 DIAGNOSIS — L03116 Cellulitis of left lower limb: Secondary | ICD-10-CM | POA: Diagnosis not present

## 2022-11-10 DIAGNOSIS — N2581 Secondary hyperparathyroidism of renal origin: Secondary | ICD-10-CM | POA: Diagnosis not present

## 2022-11-10 DIAGNOSIS — N186 End stage renal disease: Secondary | ICD-10-CM | POA: Diagnosis not present

## 2022-11-12 ENCOUNTER — Encounter: Payer: Self-pay | Admitting: Internal Medicine

## 2022-11-13 DIAGNOSIS — L03116 Cellulitis of left lower limb: Secondary | ICD-10-CM | POA: Diagnosis not present

## 2022-11-13 DIAGNOSIS — N2581 Secondary hyperparathyroidism of renal origin: Secondary | ICD-10-CM | POA: Diagnosis not present

## 2022-11-13 DIAGNOSIS — N186 End stage renal disease: Secondary | ICD-10-CM | POA: Diagnosis not present

## 2022-11-13 DIAGNOSIS — Z992 Dependence on renal dialysis: Secondary | ICD-10-CM | POA: Diagnosis not present

## 2022-11-14 ENCOUNTER — Encounter: Payer: Self-pay | Admitting: Nurse Practitioner

## 2022-11-14 ENCOUNTER — Ambulatory Visit (INDEPENDENT_AMBULATORY_CARE_PROVIDER_SITE_OTHER): Payer: Medicare HMO | Admitting: Nurse Practitioner

## 2022-11-14 VITALS — BP 161/61 | HR 82 | Temp 98.3°F | Resp 16 | Ht 67.0 in | Wt 181.2 lb

## 2022-11-14 DIAGNOSIS — R0602 Shortness of breath: Secondary | ICD-10-CM

## 2022-11-14 DIAGNOSIS — L03116 Cellulitis of left lower limb: Secondary | ICD-10-CM | POA: Diagnosis not present

## 2022-11-14 MED ORDER — ALBUTEROL SULFATE HFA 108 (90 BASE) MCG/ACT IN AERS: 2.0000 | INHALATION_SPRAY | Freq: Four times a day (QID) | RESPIRATORY_TRACT | 2 refills | Status: DC | PRN

## 2022-11-14 MED ORDER — DOXYCYCLINE HYCLATE 100 MG PO TABS
100.0000 mg | ORAL_TABLET | Freq: Two times a day (BID) | ORAL | 0 refills | Status: AC
Start: 2022-11-14 — End: 2022-11-21

## 2022-11-14 NOTE — Progress Notes (Signed)
[facility] [address] [place], [place] [address]  Internal MEDICINE  Office Visit Note  Patient Name: Jennifer Carrillo  [id number]  [id number]  Date of Service: 11/14/2022  Chief Complaint  Patient presents with   Acute Visit    Fluid leaking from left leg.      HPI Jennifer Carrillo presents for an acute sick visit for cellulitis of left leg --onset was about 3 weeks ago.  Blisters are open, draining purulent drainage. Was started on doxycycline 100 mg daily x10 days by Dr. Thedore Mins 6 days ago for cellulitis. Area is also still swollen and warm to touch Burning pain where blisters are.   Also has been using oxygen at home and an albuterol inhaler for persistent SOB but does not see pulmonology      Current Medication:  Outpatient Encounter Medications as of 11/14/2022  Medication Sig   ACCU-CHEK GUIDE test strip TEST BLOOD SUGAR THREE TIMES DAILY  AS  DIRECTED   Accu-Chek Softclix Lancets lancets USE AS INSTRUCTED 3 TIMES A DAY   acetaminophen (TYLENOL) 500 MG tablet Take 1 tablet (500 mg total) by mouth every 6 (six) hours as needed for moderate pain or headache.   albuterol (VENTOLIN HFA) 108 (90 Base) MCG/ACT inhaler Inhale 2 puffs into the lungs every 6 (six) hours as needed for wheezing or shortness of breath.   Alcohol Swabs (B-D SINGLE USE SWABS REGULAR) PADS Use as directed for 3 times daily DX E11.65   amLODipine (NORVASC) 10 MG tablet TAKE 1 TABLET EVERY DAY AT BREAKFAST   anastrozole (ARIMIDEX) 1 MG tablet TAKE 1 TABLET EVERY DAY IN THE AFTERNOON (DINNER)   atorvastatin (LIPITOR) 40 MG tablet Take 40 mg by mouth daily.   Blood Glucose Monitoring Suppl (ACCU-CHEK GUIDE ME) w/Device KIT Use as instructed. DX e11.65   clopidogrel (PLAVIX) 75 MG tablet TAKE 1 TABLET DAILY AT DINNER   docusate sodium (COLACE) 100 MG capsule Take 200 mg by mouth at bedtime as needed for mild constipation.   donepezil (ARICEPT) 10 MG tablet TAKE 1 TABLET AT BEDTIME FOR MEMORY (NEED  MD APPOINTMENT)   doxycycline (ADOXA) 100 MG tablet Take 100 mg by mouth daily.   doxycycline (VIBRA-TABS) 100 MG tablet Take 1 tablet (100 mg total) by mouth 2 (two) times daily for 7 days. Take until all gone.   ergocalciferol (VITAMIN D2) 1.25 MG (50000 UT) capsule Take 1 capsule (50,000 Units total) by mouth once a week.   fluticasone-salmeterol (ADVAIR) 100-50 MCG/ACT AEPB Inhale 1 puff into the lungs 2 (two) times daily.   insulin NPH-regular Human (70-30) 100 UNIT/ML injection Inject 12 Units into the skin 2 (two) times daily. Inject 20 units subcutaneously in the morning and 20 units in the evening. (Patient taking differently: Inject into the skin daily. Use as directed 1-2 units as needed if glucose is fasting glucose 150)   isosorbide mononitrate (IMDUR) 60 MG 24 hr tablet Take 60 mg by mouth daily.   ondansetron (ZOFRAN) 4 MG tablet Take 1 tablet (4 mg total) by mouth every 6 (six) hours as needed for nausea.   hydrALAZINE (APRESOLINE) 50 MG tablet Take 100 mg by mouth in the morning and at bedtime. Take one tablet in AM and two tablets in PM   No facility-administered encounter medications on file as of 11/14/2022.      Medical History: Past Medical History:  Diagnosis Date   Arthritis    Asthma    Calf pain    Cancer (  HCC) 2016   left breast   COPD (chronic obstructive pulmonary disease) (HCC)    Diabetes (HCC)    Discoid lupus    Gastro-esophageal reflux    Glaucoma    Hyperlipemia    Hypertension    Iron deficiency anemia    Joint pain    Leg swelling    Osteoarthritis    PVD (peripheral vascular disease) (HCC)    Sinus problem    Sleep apnea    No CPAP/ Can't tolerate   Stroke (HCC) 1987     Vital Signs: BP (!) 161/61   Pulse 82   Temp 98.3 F (36.8 C)   Resp 16   Ht 5\' 7"  (1.702 m)   Wt 181 lb 3.2 oz (82.2 kg)   SpO2 95%   BMI 28.38 kg/m    Review of Systems  Constitutional:  Positive for fatigue.  Respiratory:  Positive for shortness of  breath. Negative for cough, chest tightness and wheezing.   Cardiovascular:  Negative for chest pain and palpitations.    Physical Exam Vitals reviewed.  Constitutional:      General: She is not in acute distress.    Appearance: Normal appearance. She is not ill-appearing.  HENT:     Head: Normocephalic and atraumatic.  Eyes:     Pupils: Pupils are equal, round, and reactive to light.  Cardiovascular:     Rate and Rhythm: Normal rate and regular rhythm.  Pulmonary:     Effort: Pulmonary effort is normal. No respiratory distress.  Skin:    Findings: Wound present.          Comments: Open reddened blisters with purulent drainage at marked location  Neurological:     Mental Status: She is alert and oriented to person, place, and time.  Psychiatric:        Mood and Affect: Mood normal.        Behavior: Behavior normal.       Assessment/Plan: 1. Cellulitis of left anterior lower leg Continue doxycycline twice daily for 9 more days - doxycycline (VIBRA-TABS) 100 MG tablet; Take 1 tablet (100 mg total) by mouth 2 (two) times daily for 7 days. Take until all gone.  Dispense: 14 tablet; Refill: 0  2. SOB (shortness of breath) Continue prn use of albuterol inhaler. Establish for pulmonology with [facility] for SOB and home oxygen use, will need further testing.  - albuterol (VENTOLIN HFA) 108 (90 Base) MCG/ACT inhaler; Inhale 2 puffs into the lungs every 6 (six) hours as needed for wheezing or shortness of breath.  Dispense: 8 g; Refill: 2   General Counseling: Jennifer Carrillo verbalizes understanding of the findings of todays visit and agrees with plan of treatment. I have discussed any further diagnostic evaluation that may be needed or ordered today. We also reviewed her medications today. she has been encouraged to call the office with any questions or concerns that should arise related to todays visit.    Counseling:    No orders of the defined types were placed in this  encounter.   Meds ordered this encounter  Medications   doxycycline (VIBRA-TABS) 100 MG tablet    Sig: Take 1 tablet (100 mg total) by mouth 2 (two) times daily for 7 days. Take until all gone.    Dispense:  14 tablet    Refill:  0   albuterol (VENTOLIN HFA) 108 (90 Base) MCG/ACT inhaler    Sig: Inhale 2 puffs into the lungs every 6 (six) hours as needed  for wheezing or shortness of breath.    Dispense:  8 g    Refill:  2    Return if symptoms worsen or fail to improve for cellulitis, for also need new patient visit w/ [facility] for SOB and home oxygen use please..  [place] Controlled Substance Database was reviewed by me for overdose risk score (ORS)  Time spent:30 Minutes Time spent with patient included reviewing progress notes, labs, imaging studies, and discussing plan for follow up.   This patient was seen by Sallyanne Kuster, FNP-C in collaboration with Dr. Beverely Risen as a part of collaborative care agreement.  Shuaib Corsino R. Tedd Sias, MSN, FNP-C Internal Medicine

## 2022-11-15 DIAGNOSIS — L03116 Cellulitis of left lower limb: Secondary | ICD-10-CM | POA: Diagnosis not present

## 2022-11-15 DIAGNOSIS — Z992 Dependence on renal dialysis: Secondary | ICD-10-CM | POA: Diagnosis not present

## 2022-11-15 DIAGNOSIS — N2581 Secondary hyperparathyroidism of renal origin: Secondary | ICD-10-CM | POA: Diagnosis not present

## 2022-11-15 DIAGNOSIS — N186 End stage renal disease: Secondary | ICD-10-CM | POA: Diagnosis not present

## 2022-11-17 DIAGNOSIS — L03116 Cellulitis of left lower limb: Secondary | ICD-10-CM | POA: Diagnosis not present

## 2022-11-17 DIAGNOSIS — N186 End stage renal disease: Secondary | ICD-10-CM | POA: Diagnosis not present

## 2022-11-17 DIAGNOSIS — Z992 Dependence on renal dialysis: Secondary | ICD-10-CM | POA: Diagnosis not present

## 2022-11-17 DIAGNOSIS — N2581 Secondary hyperparathyroidism of renal origin: Secondary | ICD-10-CM | POA: Diagnosis not present

## 2022-11-19 ENCOUNTER — Encounter: Payer: Self-pay | Admitting: Internal Medicine

## 2022-11-19 ENCOUNTER — Ambulatory Visit: Payer: Medicare HMO | Admitting: Internal Medicine

## 2022-11-19 VITALS — BP 160/70 | HR 82 | Temp 98.1°F | Resp 16 | Ht 67.0 in | Wt 181.0 lb

## 2022-11-19 DIAGNOSIS — N186 End stage renal disease: Secondary | ICD-10-CM | POA: Diagnosis not present

## 2022-11-19 DIAGNOSIS — G4734 Idiopathic sleep related nonobstructive alveolar hypoventilation: Secondary | ICD-10-CM

## 2022-11-19 DIAGNOSIS — Z9981 Dependence on supplemental oxygen: Secondary | ICD-10-CM

## 2022-11-19 DIAGNOSIS — J449 Chronic obstructive pulmonary disease, unspecified: Secondary | ICD-10-CM | POA: Diagnosis not present

## 2022-11-19 DIAGNOSIS — R0602 Shortness of breath: Secondary | ICD-10-CM | POA: Diagnosis not present

## 2022-11-19 MED ORDER — BREZTRI AEROSPHERE 160-9-4.8 MCG/ACT IN AERO: 2.0000 | INHALATION_SPRAY | Freq: Two times a day (BID) | RESPIRATORY_TRACT | 11 refills | Status: DC

## 2022-11-19 NOTE — Patient Instructions (Signed)
Chronic Obstructive Pulmonary Disease  Chronic obstructive pulmonary disease (COPD) is a long-term (chronic) lung problem. When you have COPD, it is hard for air to get in and out of your lungs. Usually the condition gets worse over time, and your lungs will never return to normal. There are things you can do to keep yourself as healthy as possible. What are the causes? Smoking. This is the most common cause. Certain genes passed from parent to child (inherited). What increases the risk? Being exposed to secondhand smoke from cigarettes, pipes, or cigars. Being exposed to chemicals and other irritants, such as fumes and dust in the work environment. Having chronic lung conditions or infections. What are the signs or symptoms? Shortness of breath, especially during physical activity. A long-term cough with a large amount of thick mucus. Sometimes, the cough may not have any mucus (dry cough). Wheezing. Breathing quickly. Skin that looks gray or blue, especially in the fingers, toes, or lips. Feeling tired (fatigue). Weight loss. Chest tightness. Having infections often. Episodes when breathing symptoms become much worse (exacerbations). At the later stages of this disease, you may have swelling in the ankles, feet, or legs. How is this treated? Taking medicines. Quitting smoking, if you smoke. Rehabilitation. This includes steps to make your body work better. It may involve a team of specialists. Doing exercises. Making changes to your diet. Using oxygen. Lung surgery. Lung transplant. Comfort measures (palliative care). Follow these instructions at home: Medicines Take over-the-counter and prescription medicines only as told by your doctor. Talk to your doctor before taking any cough or allergy medicines. You may need to avoid medicines that cause your lungs to be dry. Lifestyle If you smoke, stop smoking. Smoking makes the problem worse. Do not smoke or use any products that  contain nicotine or tobacco. If you need help quitting, ask your doctor. Avoid being around things that make your breathing worse. This may include smoke, chemicals, and fumes. Stay active, but remember to rest as well. Learn and use tips on how to manage stress and control your breathing. Make sure you get enough sleep. Most adults need at least 7 hours of sleep every night. Eat healthy foods. Eat smaller meals more often. Rest before meals. Controlled breathing Learn and use tips on how to control your breathing as told by your doctor. Try: Breathing in (inhaling) through your nose for 1 second. Then, pucker your lips and breath out (exhale) through your lips for 2 seconds. Putting one hand on your belly (abdomen). Breathe in slowly through your nose for 1 second. Your hand on your belly should move out. Pucker your lips and breathe out slowly through your lips. Your hand on your belly should move in as you breathe out.  Controlled coughing Learn and use controlled coughing to clear mucus from your lungs. Follow these steps: Lean your head a little forward. Breathe in deeply. Try to hold your breath for 3 seconds. Keep your mouth slightly open while coughing 2 times. Spit any mucus out into a tissue. Rest and do the steps again 1 or 2 times as needed. General instructions Make sure you get all the shots (vaccines) that your doctor recommends. Ask your doctor about a flu shot and a pneumonia shot. Use oxygen therapy and pulmonary rehabilitation if told by your doctor. If you need home oxygen therapy, ask your doctor if you should buy a tool to measure your oxygen level (oximeter). Make a COPD action plan with your doctor. This helps you   to know what to do if you feel worse than usual. Manage any other conditions you have as told by your doctor. Avoid going outside when it is very hot, cold, or humid. Avoid people who have a sickness you can catch (contagious). Keep all follow-up  visits. Contact a doctor if: You cough up more mucus than usual. There is a change in the color or thickness of the mucus. It is harder to breathe than usual. Your breathing is faster than usual. You have trouble sleeping. You need to use your medicines more often than usual. You have trouble doing your normal activities such as getting dressed or walking around the house. Get help right away if: You have shortness of breath while resting. You have shortness of breath that stops you from: Being able to talk. Doing normal activities. Your chest hurts for longer than 5 minutes. Your skin color is more blue than usual. Your pulse oximeter shows that you have low oxygen for longer than 5 minutes. You have a fever. You feel too tired to breathe normally. These symptoms may represent a serious problem that is an emergency. Do not wait to see if the symptoms will go away. Get medical help right away. Call your local emergency services (911 in the [place] Do not drive yourself to the hospital. Summary Chronic obstructive pulmonary disease (COPD) is a long-term lung problem. The way your lungs work will never return to normal. Usually the condition gets worse over time. There are things you can do to keep yourself as healthy as possible. Take over-the-counter and prescription medicines only as told by your doctor. If you smoke, stop. Smoking makes the problem worse. This information is not intended to replace advice given to you by your health care provider. Make sure you discuss any questions you have with your health care provider. Document Revised: 11/02/2019 Document Reviewed: 11/03/2019 Elsevier Patient Education  2024 Elsevier Inc.  

## 2022-11-19 NOTE — Progress Notes (Signed)
[facility] [address] [place], [place] [address]  Pulmonary Sleep Medicine   Office Visit Note  Patient Name: Jennifer Carrillo DOB: 1933-11-25 MRN [id number]  Date of Service: 11/19/2022  Complaints/HPI: She has been having more shortness of breath than normal. She states she does have oxygen and it seems to help but not consistantly. She has inhalers but is not regular in using them. She has had cough noted. She has some sputum. She has noted there is more cough at night. She states she does not have heartburn. She is on HD and states gets dialyzed 3x per week. She has noted. No chest pain noted. No fevers or chills. Currently she states she is on advair and also has a script for albuterol   Office Spirometry Results:     ROS  General: (-) fever, (-) chills, (-) night sweats, (-) weakness Skin: (-) rashes, (-) itching,. Eyes: (-) visual changes, (-) redness, (-) itching. Nose and Sinuses: (-) nasal stuffiness or itchiness, (-) postnasal drip, (-) nosebleeds, (-) sinus trouble. Mouth and Throat: (-) sore throat, (-) hoarseness. Neck: (-) swollen glands, (-) enlarged thyroid, (-) neck pain. Respiratory: + cough, (-) bloody sputum, + shortness of breath, + wheezing. Cardiovascular: + ankle swelling, (-) chest pain. Lymphatic: (-) lymph node enlargement. Neurologic: (-) numbness, (-) tingling. Psychiatric: (-) anxiety, (-) depression   Current Medication: Outpatient Encounter Medications as of 11/19/2022  Medication Sig   ACCU-CHEK GUIDE test strip TEST BLOOD SUGAR THREE TIMES DAILY  AS  DIRECTED   Accu-Chek Softclix Lancets lancets USE AS INSTRUCTED 3 TIMES A DAY   acetaminophen (TYLENOL) 500 MG tablet Take 1 tablet (500 mg total) by mouth every 6 (six) hours as needed for moderate pain or headache.   albuterol (VENTOLIN HFA) 108 (90 Base) MCG/ACT inhaler Inhale 2 puffs into the lungs every 6 (six) hours as needed for wheezing or shortness of breath.   Alcohol  Swabs (B-D SINGLE USE SWABS REGULAR) PADS Use as directed for 3 times daily DX E11.65   amLODipine (NORVASC) 10 MG tablet TAKE 1 TABLET EVERY DAY AT BREAKFAST   anastrozole (ARIMIDEX) 1 MG tablet TAKE 1 TABLET EVERY DAY IN THE AFTERNOON (DINNER)   atorvastatin (LIPITOR) 40 MG tablet Take 40 mg by mouth daily.   Blood Glucose Monitoring Suppl (ACCU-CHEK GUIDE ME) w/Device KIT Use as instructed. DX e11.65   clopidogrel (PLAVIX) 75 MG tablet TAKE 1 TABLET DAILY AT DINNER   docusate sodium (COLACE) 100 MG capsule Take 200 mg by mouth at bedtime as needed for mild constipation.   donepezil (ARICEPT) 10 MG tablet TAKE 1 TABLET AT BEDTIME FOR MEMORY (NEED MD APPOINTMENT)   doxycycline (ADOXA) 100 MG tablet Take 100 mg by mouth daily.   doxycycline (VIBRA-TABS) 100 MG tablet Take 1 tablet (100 mg total) by mouth 2 (two) times daily for 7 days. Take until all gone.   ergocalciferol (VITAMIN D2) 1.25 MG (50000 UT) capsule Take 1 capsule (50,000 Units total) by mouth once a week.   fluticasone-salmeterol (ADVAIR) 100-50 MCG/ACT AEPB Inhale 1 puff into the lungs 2 (two) times daily.   insulin NPH-regular Human (70-30) 100 UNIT/ML injection Inject 12 Units into the skin 2 (two) times daily. Inject 20 units subcutaneously in the morning and 20 units in the evening. (Patient taking differently: Inject into the skin daily. Use as directed 1-2 units as needed if glucose is fasting glucose 150)   isosorbide mononitrate (IMDUR) 60 MG 24 hr tablet Take 60  mg by mouth daily.   ondansetron (ZOFRAN) 4 MG tablet Take 1 tablet (4 mg total) by mouth every 6 (six) hours as needed for nausea.   hydrALAZINE (APRESOLINE) 50 MG tablet Take 100 mg by mouth in the morning and at bedtime. Take one tablet in AM and two tablets in PM   No facility-administered encounter medications on file as of 11/19/2022.    Surgical History: Past Surgical History:  Procedure Laterality Date   BREAST SURGERY Left    left lumpectomy   CAROTID  ANGIOGRAPHY Left 06/25/2018   Procedure: CAROTID ANGIOGRAPHY, possible intervention;  Surgeon: Renford Dills, MD;  Location: [facility];  Service: Cardiovascular;  Laterality: Left;   CAROTID ENDARTERECTOMY Right    CAROTID PTA/STENT INTERVENTION Left 06/25/2018   Procedure: CAROTID PTA/STENT INTERVENTION;  Surgeon: Renford Dills, MD;  Location: [facility];  Service: Cardiovascular;  Laterality: Left;   CATARACT EXTRACTION Right 2013   CORONARY STENT PLACEMENT     L. L. E.   DIALYSIS/PERMA CATHETER INSERTION N/A 12/29/2020   Procedure: DIALYSIS/PERMA CATHETER INSERTION;  Surgeon: Annice Needy, MD;  Location: [facility];  Service: Cardiovascular;  Laterality: N/A;   DIALYSIS/PERMA CATHETER INSERTION N/A 12/14/2021   Procedure: DIALYSIS/PERMA CATHETER INSERTION;  Surgeon: Renford Dills, MD;  Location: [facility];  Service: Cardiovascular;  Laterality: N/A;   EYE SURGERY Bilateral    cataract   LEFT HEART CATH AND CORONARY ANGIOGRAPHY Left 05/06/2018   Procedure: LEFT HEART CATH AND CORONARY ANGIOGRAPHY;  Surgeon: Lamar Blinks, MD;  Location: [facility];  Service: Cardiovascular;  Laterality: Left;   LOWER EXTREMITY ANGIOGRAPHY Right 01/22/2018   Procedure: LOWER EXTREMITY ANGIOGRAPHY;  Surgeon: Renford Dills, MD;  Location: [facility];  Service: Cardiovascular;  Laterality: Right;   RENAL ANGIOGRAPHY Left 02/26/2018   Procedure: RENAL ANGIOGRAPHY;  Surgeon: Renford Dills, MD;  Location: [facility];  Service: Cardiovascular;  Laterality: Left;   STENT PLACEMENT VASCULAR ([facility] HX) Left    stent placement on LLE   TUBAL LIGATION      Medical History: Past Medical History:  Diagnosis Date   Arthritis    Asthma    Calf pain    Cancer (HCC) 2016   left breast   COPD (chronic obstructive pulmonary disease) (HCC)    Diabetes (HCC)    Discoid lupus    Gastro-esophageal reflux    Glaucoma     Hyperlipemia    Hypertension    Iron deficiency anemia    Joint pain    Leg swelling    Osteoarthritis    PVD (peripheral vascular disease) (HCC)    Sinus problem    Sleep apnea    No CPAP/ Can't tolerate   Stroke (HCC) 1987    Family History: Family History  Problem Relation Age of Onset   Heart disease Mother    Diabetes Sister    Leukemia Daughter    Lung cancer Brother    Diabetes Other    Hypertension Other    Bladder Cancer Neg Hx    Kidney disease Neg Hx    Prostate cancer Neg Hx    Breast cancer Neg Hx     Social History: Social History   Socioeconomic History   Marital status: Married    Spouse name: Not on file   Number of children: Not on file   Years of education: Not on file   Highest education  level: Not on file  Occupational History   Not on file  Tobacco Use   Smoking status: Former    Current packs/day: 0.00    Types: Cigarettes    Quit date: 31    Years since quitting: 37.8   Smokeless tobacco: Never   Tobacco comments:    quit 31 years  Vaping Use   Vaping status: Never Used  Substance and Sexual Activity   Alcohol use: No   Drug use: No   Sexual activity: Not on file  Other Topics Concern   Not on file  Social History Narrative   Walks with a rolling walker; remote hx of smoking; no alcohol; [place]- with husband/son. Daughter lives in [place].    Social Determinants of Health   Financial Resource Strain: Low Risk  (06/23/2020)   Overall Financial Resource Strain (CARDIA)    Difficulty of Paying Living Expenses: Not very hard  Food Insecurity: No Food Insecurity (09/26/2022)   Hunger Vital Sign    Worried About Running Out of Food in the Last Year: Never true    Ran Out of Food in the Last Year: Never true  Recent Concern: Food Insecurity - Food Insecurity Present (08/10/2022)   Hunger Vital Sign    Worried About Running Out of Food in the Last Year: Sometimes true    Ran Out of Food in the Last Year: Sometimes true   Transportation Needs: No Transportation Needs (09/26/2022)   PRAPARE - Administrator, Civil Service (Medical): No    Lack of Transportation (Non-Medical): No  Physical Activity: Not on file  Stress: Not on file  Social Connections: Not on file  Intimate Partner Violence: Not At Risk (08/10/2022)   Humiliation, Afraid, Rape, and Kick questionnaire    Fear of Current or Ex-Partner: No    Emotionally Abused: No    Physically Abused: No    Sexually Abused: No    Vital Signs: Blood pressure (!) 160/70, pulse 82, temperature 98.1 F (36.7 C), resp. rate 16, height 5\' 7"  (1.702 m), weight 181 lb (82.1 kg), SpO2 99%.  Examination: General Appearance: The patient is well-developed, well-nourished, and in no distress. Skin: Gross inspection of skin unremarkable. Head: normocephalic, no gross deformities. Eyes: no gross deformities noted. ENT: ears appear grossly normal no exudates. Neck: Supple. No thyromegaly. No LAD. Respiratory: few rhonchi noted. Cardiovascular: Normal S1 and S2 without murmur or rub. Extremities: No cyanosis. pulses are equal. Neurologic: Alert and oriented. No involuntary movements.  LABS: No results found for this or any previous visit (from the past 2160 hour(s)).  Radiology: MM 3D SCREENING MAMMOGRAM BILATERAL BREAST  Result Date: 09/25/2022 CLINICAL DATA:  Screening. EXAM: DIGITAL SCREENING BILATERAL MAMMOGRAM WITH TOMOSYNTHESIS AND CAD TECHNIQUE: Bilateral screening digital craniocaudal and mediolateral oblique mammograms were obtained. Bilateral screening digital breast tomosynthesis was performed. The images were evaluated with computer-aided detection. COMPARISON:  Previous exam(s). ACR Breast Density Category c: The breasts are heterogeneously dense, which may obscure small masses. FINDINGS: There are no findings suspicious for malignancy. IMPRESSION: No mammographic evidence of malignancy. A result letter of this screening mammogram will be  mailed directly to the patient. RECOMMENDATION: Screening mammogram in one year. (Code:SM-B-01Y) BI-RADS CATEGORY  1: Negative. Electronically Signed   By: Frederico Hamman M.D.   On: 09/25/2022 12:15    No results found.  No results found.  Assessment and Plan: Patient Active Problem List   Diagnosis Date Noted   CKD (chronic kidney disease) stage 4, GFR  15-29 ml/min (HCC) 01/12/2022   Lumbar spinal stenosis 01/12/2022   Carotid artery disease (HCC) 01/12/2022   Osteoarthritis 01/12/2022   Acute midline thoracic back pain    Gastroenteritis 10/28/2021   Malignant neoplasm of female breast (HCC) 07/04/2021   Asthma 07/04/2021   Dependence on renal dialysis (HCC) 07/04/2021   Gastro-esophageal reflux disease without esophagitis 07/04/2021   End stage renal disease on dialysis due to type 2 diabetes mellitus (HCC) 07/04/2021   Discoid lupus erythematosus 07/04/2021   Hypertensive heart and chronic kidney disease without heart failure, with stage 5 chronic kidney disease, or end stage renal disease (HCC) 07/04/2021   Obstructive sleep apnea (adult) (pediatric) 07/04/2021   Presence of other vascular implants and grafts 07/04/2021   Secondary hyperparathyroidism of renal origin (HCC) 07/04/2021   Atherosclerotic heart disease of native coronary artery with other forms of angina pectoris (HCC) 07/04/2021   Type 2 diabetes mellitus with diabetic polyneuropathy (HCC) 07/04/2021   Immunodeficiency, unspecified (HCC) 07/04/2021   Type 2 diabetes mellitus (HCC) 07/04/2021   Anemia in chronic kidney disease, on chronic dialysis (HCC) 01/13/2021   Pain    Pressure injury of skin 12/31/2020   Pressure ulcer of left heel, unspecified stage 12/31/2020   Bilateral leg pain    ESRD (end stage renal disease) (HCC)    Acute kidney injury superimposed on CKD IV (HCC) 12/18/2020   Metabolic acidosis 12/18/2020   Rhabdomyolysis 12/18/2020   Sepsis (HCC) 12/18/2020   Hypertensive emergency without  congestive heart failure 12/18/2020   Hyperglycemia due to type 2 diabetes mellitus (HCC) 12/18/2020   Shingles 12/05/2020 12/18/2020   Anemia of chronic kidney failure, stage 4 (severe) (HCC) 08/31/2020   Aortic atherosclerosis (HCC) 05/04/2020   Mixed hyperlipidemia 10/19/2019   Type 2 diabetes mellitus with stage 3b chronic kidney disease, with long-term current use of insulin (HCC) 10/19/2019   LVH (left ventricular hypertrophy) due to hypertensive disease, without heart failure 11/10/2018   Carotid stenosis, symptomatic w/o infarct 06/25/2018   Hypertension 06/18/2018   Carbuncle and furuncle of buttock 06/18/2018   Coronary artery disease involving native coronary artery of native heart 05/19/2018   Old inferior wall myocardial infarction 05/19/2018   Stable angina (HCC) 03/31/2018   Abnormal ECG 03/07/2018   Benign essential HTN 03/07/2018   Bilateral carotid artery stenosis 03/07/2018   SOBOE (shortness of breath on exertion) 03/07/2018   Atherosclerotic peripheral vascular disease with intermittent claudication (HCC) 03/07/2018   Peripheral vascular disease (HCC) 03/07/2018   Carotid stenosis 02/26/2018   Renal artery stenosis (HCC) 02/26/2018   Anemia 02/05/2018   Leg pain 01/13/2018   PAD (peripheral artery disease) (HCC) 01/13/2018   Diabetes mellitus without complication (HCC) 04/09/2017   Renovascular hypertension 04/09/2017   Primary osteoarthritis of both knees 03/12/2017   Malignant neoplasm of breast in female, estrogen receptor positive (HCC) 12/16/2015   Malignant neoplasm of breast (female) (HCC) 12/16/2015   Iron deficiency anemia 12/08/2015    1. SOB (shortness of breath) Patient is at baseline will get follow-up pulmonary function tests also get a chest x-ray done. - Pulmonary function test; Future - DG Chest 2 View; Future  2. ESRD (end stage renal disease) (HCC)   She will continue to follow-up with her nephrology team.  Patient is going to continue to  monitor on labs in fluid intake.  3. Nocturnal hypoxemia   Oxygen therapy at nighttime  4. Oxygen dependent  Stressed to her the importance of using oxygen at nighttime.  5. Chronic obstructive  pulmonary disease, unspecified COPD type (HCC)   As noted above will get pulmonary functions to reassess - Budeson-Glycopyrrol-Formoterol (BREZTRI AEROSPHERE) 160-9-4.8 MCG/ACT AERO; Inhale 2 puffs into the lungs 2 (two) times daily.  Dispense: 10.7 g; Refill: 11   General Counseling: I have discussed the findings of the evaluation and examination with Sahiba.  I have also discussed any further diagnostic evaluation thatmay be needed or ordered today. Tyanna verbalizes understanding of the findings of todays visit. We also reviewed her medications today and discussed drug interactions and side effects including but not limited excessive drowsiness and altered mental states. We also discussed that there is always a risk not just to her but also people around her. she has been encouraged to call the office with any questions or concerns that should arise related to todays visit.  No orders of the defined types were placed in this encounter.    Time spent: 47  I have personally obtained a history, examined the patient, evaluated laboratory and imaging results, formulated the assessment and plan and placed orders.    Yevonne Pax, MD [facility] Pulmonary and Critical Care Sleep medicine

## 2022-11-20 DIAGNOSIS — N2581 Secondary hyperparathyroidism of renal origin: Secondary | ICD-10-CM | POA: Diagnosis not present

## 2022-11-20 DIAGNOSIS — Z992 Dependence on renal dialysis: Secondary | ICD-10-CM | POA: Diagnosis not present

## 2022-11-20 DIAGNOSIS — L03116 Cellulitis of left lower limb: Secondary | ICD-10-CM | POA: Diagnosis not present

## 2022-11-20 DIAGNOSIS — N186 End stage renal disease: Secondary | ICD-10-CM | POA: Diagnosis not present

## 2022-11-22 DIAGNOSIS — L03116 Cellulitis of left lower limb: Secondary | ICD-10-CM | POA: Diagnosis not present

## 2022-11-22 DIAGNOSIS — Z992 Dependence on renal dialysis: Secondary | ICD-10-CM | POA: Diagnosis not present

## 2022-11-22 DIAGNOSIS — N186 End stage renal disease: Secondary | ICD-10-CM | POA: Diagnosis not present

## 2022-11-22 DIAGNOSIS — N2581 Secondary hyperparathyroidism of renal origin: Secondary | ICD-10-CM | POA: Diagnosis not present

## 2022-11-24 DIAGNOSIS — Z992 Dependence on renal dialysis: Secondary | ICD-10-CM | POA: Diagnosis not present

## 2022-11-24 DIAGNOSIS — N186 End stage renal disease: Secondary | ICD-10-CM | POA: Diagnosis not present

## 2022-11-24 DIAGNOSIS — N2581 Secondary hyperparathyroidism of renal origin: Secondary | ICD-10-CM | POA: Diagnosis not present

## 2022-11-24 DIAGNOSIS — L03116 Cellulitis of left lower limb: Secondary | ICD-10-CM | POA: Diagnosis not present

## 2022-11-26 ENCOUNTER — Ambulatory Visit: Payer: Medicare HMO | Admitting: Gastroenterology

## 2022-11-27 DIAGNOSIS — Z992 Dependence on renal dialysis: Secondary | ICD-10-CM | POA: Diagnosis not present

## 2022-11-27 DIAGNOSIS — L03116 Cellulitis of left lower limb: Secondary | ICD-10-CM | POA: Diagnosis not present

## 2022-11-27 DIAGNOSIS — N2581 Secondary hyperparathyroidism of renal origin: Secondary | ICD-10-CM | POA: Diagnosis not present

## 2022-11-27 DIAGNOSIS — N186 End stage renal disease: Secondary | ICD-10-CM | POA: Diagnosis not present

## 2022-11-29 DIAGNOSIS — N186 End stage renal disease: Secondary | ICD-10-CM | POA: Diagnosis not present

## 2022-11-29 DIAGNOSIS — N2581 Secondary hyperparathyroidism of renal origin: Secondary | ICD-10-CM | POA: Diagnosis not present

## 2022-11-29 DIAGNOSIS — L03116 Cellulitis of left lower limb: Secondary | ICD-10-CM | POA: Diagnosis not present

## 2022-11-29 DIAGNOSIS — Z992 Dependence on renal dialysis: Secondary | ICD-10-CM | POA: Diagnosis not present

## 2022-12-01 DIAGNOSIS — Z992 Dependence on renal dialysis: Secondary | ICD-10-CM | POA: Diagnosis not present

## 2022-12-01 DIAGNOSIS — N2581 Secondary hyperparathyroidism of renal origin: Secondary | ICD-10-CM | POA: Diagnosis not present

## 2022-12-01 DIAGNOSIS — N186 End stage renal disease: Secondary | ICD-10-CM | POA: Diagnosis not present

## 2022-12-01 DIAGNOSIS — L03116 Cellulitis of left lower limb: Secondary | ICD-10-CM | POA: Diagnosis not present

## 2022-12-03 ENCOUNTER — Ambulatory Visit: Payer: Medicare HMO | Admitting: Physician Assistant

## 2022-12-03 ENCOUNTER — Other Ambulatory Visit: Payer: Self-pay | Admitting: Physician Assistant

## 2022-12-03 ENCOUNTER — Telehealth: Payer: Self-pay

## 2022-12-03 DIAGNOSIS — M79601 Pain in right arm: Secondary | ICD-10-CM

## 2022-12-04 DIAGNOSIS — N2581 Secondary hyperparathyroidism of renal origin: Secondary | ICD-10-CM | POA: Diagnosis not present

## 2022-12-04 DIAGNOSIS — Z992 Dependence on renal dialysis: Secondary | ICD-10-CM | POA: Diagnosis not present

## 2022-12-04 DIAGNOSIS — N186 End stage renal disease: Secondary | ICD-10-CM | POA: Diagnosis not present

## 2022-12-04 DIAGNOSIS — L03116 Cellulitis of left lower limb: Secondary | ICD-10-CM | POA: Diagnosis not present

## 2022-12-04 NOTE — Telephone Encounter (Signed)
Done Jennifer Carrillo called husband to let him know theres xray's ordered for her.

## 2022-12-05 ENCOUNTER — Encounter: Payer: Self-pay | Admitting: Internal Medicine

## 2022-12-05 ENCOUNTER — Ambulatory Visit: Payer: Medicare HMO | Admitting: Physician Assistant

## 2022-12-05 ENCOUNTER — Ambulatory Visit
Admission: RE | Admit: 2022-12-05 | Discharge: 2022-12-05 | Disposition: A | Discharge status: Home / Self Care | Payer: Medicare HMO | Source: Ambulatory Visit | Attending: Physician Assistant | Admitting: Physician Assistant

## 2022-12-05 DIAGNOSIS — R600 Localized edema: Secondary | ICD-10-CM | POA: Diagnosis not present

## 2022-12-05 DIAGNOSIS — M25531 Pain in right wrist: Secondary | ICD-10-CM | POA: Diagnosis not present

## 2022-12-05 DIAGNOSIS — M25521 Pain in right elbow: Secondary | ICD-10-CM | POA: Diagnosis not present

## 2022-12-05 DIAGNOSIS — M79601 Pain in right arm: Secondary | ICD-10-CM

## 2022-12-05 DIAGNOSIS — M79631 Pain in right forearm: Secondary | ICD-10-CM | POA: Diagnosis not present

## 2022-12-06 DIAGNOSIS — L03116 Cellulitis of left lower limb: Secondary | ICD-10-CM | POA: Diagnosis not present

## 2022-12-06 DIAGNOSIS — Z992 Dependence on renal dialysis: Secondary | ICD-10-CM | POA: Diagnosis not present

## 2022-12-06 DIAGNOSIS — N186 End stage renal disease: Secondary | ICD-10-CM | POA: Diagnosis not present

## 2022-12-06 DIAGNOSIS — N2581 Secondary hyperparathyroidism of renal origin: Secondary | ICD-10-CM | POA: Diagnosis not present

## 2022-12-08 DIAGNOSIS — N186 End stage renal disease: Secondary | ICD-10-CM | POA: Diagnosis not present

## 2022-12-08 DIAGNOSIS — N2581 Secondary hyperparathyroidism of renal origin: Secondary | ICD-10-CM | POA: Diagnosis not present

## 2022-12-08 DIAGNOSIS — Z992 Dependence on renal dialysis: Secondary | ICD-10-CM | POA: Diagnosis not present

## 2022-12-08 DIAGNOSIS — L03116 Cellulitis of left lower limb: Secondary | ICD-10-CM | POA: Diagnosis not present

## 2022-12-11 DIAGNOSIS — Z992 Dependence on renal dialysis: Secondary | ICD-10-CM | POA: Diagnosis not present

## 2022-12-11 DIAGNOSIS — N186 End stage renal disease: Secondary | ICD-10-CM | POA: Diagnosis not present

## 2022-12-12 ENCOUNTER — Telehealth: Payer: Self-pay

## 2022-12-12 NOTE — Telephone Encounter (Signed)
Patient husband called asking about her arm x-ray, told them it hasn't been read yet and if it being done a day before a holiday it hasn't been read and it will possible be another week before we can get it read.

## 2022-12-13 DIAGNOSIS — Z992 Dependence on renal dialysis: Secondary | ICD-10-CM | POA: Diagnosis not present

## 2022-12-13 DIAGNOSIS — N186 End stage renal disease: Secondary | ICD-10-CM | POA: Diagnosis not present

## 2022-12-14 ENCOUNTER — Telehealth: Payer: Self-pay | Admitting: Physician Assistant

## 2022-12-14 NOTE — Telephone Encounter (Signed)
Patient's son called office upset because we have not received x-ray results on her arm and she is in pain. I s/w [facility], they will call radiologist to see if they can expedite reading-Toni

## 2022-12-15 DIAGNOSIS — Z992 Dependence on renal dialysis: Secondary | ICD-10-CM | POA: Diagnosis not present

## 2022-12-15 DIAGNOSIS — N186 End stage renal disease: Secondary | ICD-10-CM | POA: Diagnosis not present

## 2022-12-18 DIAGNOSIS — N186 End stage renal disease: Secondary | ICD-10-CM | POA: Diagnosis not present

## 2022-12-18 DIAGNOSIS — Z992 Dependence on renal dialysis: Secondary | ICD-10-CM | POA: Diagnosis not present

## 2022-12-19 ENCOUNTER — Telehealth: Payer: Self-pay

## 2022-12-19 ENCOUNTER — Other Ambulatory Visit: Payer: Self-pay

## 2022-12-19 DIAGNOSIS — B351 Tinea unguium: Secondary | ICD-10-CM | POA: Diagnosis not present

## 2022-12-19 DIAGNOSIS — E1140 Type 2 diabetes mellitus with diabetic neuropathy, unspecified: Secondary | ICD-10-CM | POA: Diagnosis not present

## 2022-12-19 DIAGNOSIS — L851 Acquired keratosis [keratoderma] palmaris et plantaris: Secondary | ICD-10-CM | POA: Diagnosis not present

## 2022-12-19 MED ORDER — PREDNISONE 10 MG (21) PO TBPK: ORAL_TABLET | ORAL | 0 refills | Status: DC

## 2022-12-19 NOTE — Telephone Encounter (Signed)
Spoke with patient's husband regarding arm/wrist x-ray. Will send Prednisone for patient's inflammation if Nephrologist approves.

## 2022-12-19 NOTE — Telephone Encounter (Signed)
-----   Message from Carlean Jews sent at 12/17/2022  4:26 PM EST ----- Please see how her arm is doing now. Xrays showed signs of inflammation and arthritis. It is possible she could have been having a gout flare, but treating this can be difficult with her level of kidney disease, so want to avoid meds if already improving. It not could consider a round of prednisone if nephrology ok with this.

## 2022-12-19 NOTE — Telephone Encounter (Signed)
Got approval from Nephrology to send patient a prednisone taper. Sending in [facility], notified patient's husband.

## 2022-12-19 NOTE — Telephone Encounter (Signed)
Per Whitney/Dr. Thedore Mins from [facility], ok to send Prednisone for patient.

## 2022-12-20 DIAGNOSIS — N186 End stage renal disease: Secondary | ICD-10-CM | POA: Diagnosis not present

## 2022-12-20 DIAGNOSIS — Z992 Dependence on renal dialysis: Secondary | ICD-10-CM | POA: Diagnosis not present

## 2022-12-22 DIAGNOSIS — N186 End stage renal disease: Secondary | ICD-10-CM | POA: Diagnosis not present

## 2022-12-22 DIAGNOSIS — Z992 Dependence on renal dialysis: Secondary | ICD-10-CM | POA: Diagnosis not present

## 2022-12-24 DIAGNOSIS — R0602 Shortness of breath: Secondary | ICD-10-CM | POA: Diagnosis not present

## 2022-12-24 DIAGNOSIS — I150 Renovascular hypertension: Secondary | ICD-10-CM | POA: Diagnosis not present

## 2022-12-24 DIAGNOSIS — I10 Essential (primary) hypertension: Secondary | ICD-10-CM | POA: Diagnosis not present

## 2022-12-24 DIAGNOSIS — E782 Mixed hyperlipidemia: Secondary | ICD-10-CM | POA: Diagnosis not present

## 2022-12-24 DIAGNOSIS — I6523 Occlusion and stenosis of bilateral carotid arteries: Secondary | ICD-10-CM | POA: Diagnosis not present

## 2022-12-24 DIAGNOSIS — I739 Peripheral vascular disease, unspecified: Secondary | ICD-10-CM | POA: Diagnosis not present

## 2022-12-24 DIAGNOSIS — I70219 Atherosclerosis of native arteries of extremities with intermittent claudication, unspecified extremity: Secondary | ICD-10-CM | POA: Diagnosis not present

## 2022-12-24 DIAGNOSIS — I700 Atherosclerosis of aorta: Secondary | ICD-10-CM | POA: Diagnosis not present

## 2022-12-24 DIAGNOSIS — E1122 Type 2 diabetes mellitus with diabetic chronic kidney disease: Secondary | ICD-10-CM | POA: Diagnosis not present

## 2022-12-24 DIAGNOSIS — I2510 Atherosclerotic heart disease of native coronary artery without angina pectoris: Secondary | ICD-10-CM | POA: Diagnosis not present

## 2022-12-25 DIAGNOSIS — N186 End stage renal disease: Secondary | ICD-10-CM | POA: Diagnosis not present

## 2022-12-25 DIAGNOSIS — Z992 Dependence on renal dialysis: Secondary | ICD-10-CM | POA: Diagnosis not present

## 2022-12-27 DIAGNOSIS — Z992 Dependence on renal dialysis: Secondary | ICD-10-CM | POA: Diagnosis not present

## 2022-12-27 DIAGNOSIS — N186 End stage renal disease: Secondary | ICD-10-CM | POA: Diagnosis not present

## 2022-12-29 DIAGNOSIS — N186 End stage renal disease: Secondary | ICD-10-CM | POA: Diagnosis not present

## 2022-12-29 DIAGNOSIS — Z992 Dependence on renal dialysis: Secondary | ICD-10-CM | POA: Diagnosis not present

## 2022-12-31 DIAGNOSIS — N186 End stage renal disease: Secondary | ICD-10-CM | POA: Diagnosis not present

## 2022-12-31 DIAGNOSIS — Z992 Dependence on renal dialysis: Secondary | ICD-10-CM | POA: Diagnosis not present

## 2023-01-03 DIAGNOSIS — N186 End stage renal disease: Secondary | ICD-10-CM | POA: Diagnosis not present

## 2023-01-03 DIAGNOSIS — Z992 Dependence on renal dialysis: Secondary | ICD-10-CM | POA: Diagnosis not present

## 2023-01-04 ENCOUNTER — Other Ambulatory Visit: Payer: Self-pay | Admitting: Physician Assistant

## 2023-01-05 DIAGNOSIS — N186 End stage renal disease: Secondary | ICD-10-CM | POA: Diagnosis not present

## 2023-01-05 DIAGNOSIS — Z992 Dependence on renal dialysis: Secondary | ICD-10-CM | POA: Diagnosis not present

## 2023-01-07 DIAGNOSIS — R0602 Shortness of breath: Secondary | ICD-10-CM | POA: Diagnosis not present

## 2023-01-07 DIAGNOSIS — I2510 Atherosclerotic heart disease of native coronary artery without angina pectoris: Secondary | ICD-10-CM | POA: Diagnosis not present

## 2023-01-08 DIAGNOSIS — Z992 Dependence on renal dialysis: Secondary | ICD-10-CM | POA: Diagnosis not present

## 2023-01-08 DIAGNOSIS — N186 End stage renal disease: Secondary | ICD-10-CM | POA: Diagnosis not present

## 2023-01-10 ENCOUNTER — Encounter: Payer: Self-pay | Admitting: Internal Medicine

## 2023-01-10 ENCOUNTER — Telehealth: Payer: Self-pay | Admitting: Physician Assistant

## 2023-01-10 DIAGNOSIS — Z992 Dependence on renal dialysis: Secondary | ICD-10-CM | POA: Diagnosis not present

## 2023-01-10 DIAGNOSIS — N186 End stage renal disease: Secondary | ICD-10-CM | POA: Diagnosis not present

## 2023-01-10 NOTE — Telephone Encounter (Signed)
 Patient to call back with updated insurance-Toni

## 2023-01-12 DIAGNOSIS — Z992 Dependence on renal dialysis: Secondary | ICD-10-CM | POA: Diagnosis not present

## 2023-01-12 DIAGNOSIS — N186 End stage renal disease: Secondary | ICD-10-CM | POA: Diagnosis not present

## 2023-01-15 DIAGNOSIS — Z992 Dependence on renal dialysis: Secondary | ICD-10-CM | POA: Diagnosis not present

## 2023-01-15 DIAGNOSIS — N186 End stage renal disease: Secondary | ICD-10-CM | POA: Diagnosis not present

## 2023-01-16 ENCOUNTER — Encounter: Payer: Medicare Other | Admitting: Internal Medicine

## 2023-01-17 DIAGNOSIS — Z992 Dependence on renal dialysis: Secondary | ICD-10-CM | POA: Diagnosis not present

## 2023-01-17 DIAGNOSIS — N186 End stage renal disease: Secondary | ICD-10-CM | POA: Diagnosis not present

## 2023-01-19 DIAGNOSIS — Z992 Dependence on renal dialysis: Secondary | ICD-10-CM | POA: Diagnosis not present

## 2023-01-19 DIAGNOSIS — N186 End stage renal disease: Secondary | ICD-10-CM | POA: Diagnosis not present

## 2023-01-22 DIAGNOSIS — Z992 Dependence on renal dialysis: Secondary | ICD-10-CM | POA: Diagnosis not present

## 2023-01-22 DIAGNOSIS — N250 Renal osteodystrophy: Secondary | ICD-10-CM | POA: Diagnosis not present

## 2023-01-22 DIAGNOSIS — N186 End stage renal disease: Secondary | ICD-10-CM | POA: Diagnosis not present

## 2023-01-24 DIAGNOSIS — N186 End stage renal disease: Secondary | ICD-10-CM | POA: Diagnosis not present

## 2023-01-24 DIAGNOSIS — Z992 Dependence on renal dialysis: Secondary | ICD-10-CM | POA: Diagnosis not present

## 2023-01-26 DIAGNOSIS — N186 End stage renal disease: Secondary | ICD-10-CM | POA: Diagnosis not present

## 2023-01-26 DIAGNOSIS — Z992 Dependence on renal dialysis: Secondary | ICD-10-CM | POA: Diagnosis not present

## 2023-01-28 ENCOUNTER — Ambulatory Visit: Payer: Medicare Other | Admitting: Internal Medicine

## 2023-01-29 DIAGNOSIS — Z992 Dependence on renal dialysis: Secondary | ICD-10-CM | POA: Diagnosis not present

## 2023-01-29 DIAGNOSIS — N186 End stage renal disease: Secondary | ICD-10-CM | POA: Diagnosis not present

## 2023-01-30 ENCOUNTER — Encounter: Payer: Medicare Other | Admitting: Internal Medicine

## 2023-01-31 ENCOUNTER — Telehealth: Payer: Self-pay

## 2023-01-31 DIAGNOSIS — N186 End stage renal disease: Secondary | ICD-10-CM | POA: Diagnosis not present

## 2023-01-31 DIAGNOSIS — Z992 Dependence on renal dialysis: Secondary | ICD-10-CM | POA: Diagnosis not present

## 2023-02-01 ENCOUNTER — Other Ambulatory Visit: Payer: Self-pay | Admitting: Physician Assistant

## 2023-02-01 ENCOUNTER — Other Ambulatory Visit: Payer: Self-pay

## 2023-02-01 DIAGNOSIS — R0602 Shortness of breath: Secondary | ICD-10-CM

## 2023-02-01 DIAGNOSIS — J449 Chronic obstructive pulmonary disease, unspecified: Secondary | ICD-10-CM

## 2023-02-01 MED ORDER — ALBUTEROL SULFATE HFA 108 (90 BASE) MCG/ACT IN AERS: 2.0000 | INHALATION_SPRAY | Freq: Four times a day (QID) | RESPIRATORY_TRACT | 2 refills | Status: DC | PRN

## 2023-02-01 MED ORDER — DONEPEZIL HCL 10 MG PO TABS: ORAL_TABLET | ORAL | 3 refills | Status: AC

## 2023-02-01 MED ORDER — BREZTRI AEROSPHERE 160-9-4.8 MCG/ACT IN AERO
2.0000 | INHALATION_SPRAY | Freq: Two times a day (BID) | RESPIRATORY_TRACT | 11 refills | Status: DC
Start: 2023-02-01 — End: 2023-08-26

## 2023-02-01 NOTE — Telephone Encounter (Signed)
Pt daughter  advised that we send med

## 2023-02-02 DIAGNOSIS — Z992 Dependence on renal dialysis: Secondary | ICD-10-CM | POA: Diagnosis not present

## 2023-02-02 DIAGNOSIS — N186 End stage renal disease: Secondary | ICD-10-CM | POA: Diagnosis not present

## 2023-02-05 DIAGNOSIS — Z992 Dependence on renal dialysis: Secondary | ICD-10-CM | POA: Diagnosis not present

## 2023-02-05 DIAGNOSIS — N186 End stage renal disease: Secondary | ICD-10-CM | POA: Diagnosis not present

## 2023-02-05 DIAGNOSIS — D509 Iron deficiency anemia, unspecified: Secondary | ICD-10-CM | POA: Diagnosis not present

## 2023-02-07 DIAGNOSIS — N186 End stage renal disease: Secondary | ICD-10-CM | POA: Diagnosis not present

## 2023-02-07 DIAGNOSIS — Z992 Dependence on renal dialysis: Secondary | ICD-10-CM | POA: Diagnosis not present

## 2023-02-08 DIAGNOSIS — N186 End stage renal disease: Secondary | ICD-10-CM | POA: Diagnosis not present

## 2023-02-08 DIAGNOSIS — Z992 Dependence on renal dialysis: Secondary | ICD-10-CM | POA: Diagnosis not present

## 2023-02-09 DIAGNOSIS — N186 End stage renal disease: Secondary | ICD-10-CM | POA: Diagnosis not present

## 2023-02-09 DIAGNOSIS — Z992 Dependence on renal dialysis: Secondary | ICD-10-CM | POA: Diagnosis not present

## 2023-02-11 ENCOUNTER — Ambulatory Visit: Payer: Medicare Other | Admitting: Internal Medicine

## 2023-02-12 DIAGNOSIS — N186 End stage renal disease: Secondary | ICD-10-CM | POA: Diagnosis not present

## 2023-02-12 DIAGNOSIS — Z992 Dependence on renal dialysis: Secondary | ICD-10-CM | POA: Diagnosis not present

## 2023-02-14 DIAGNOSIS — N186 End stage renal disease: Secondary | ICD-10-CM | POA: Diagnosis not present

## 2023-02-14 DIAGNOSIS — Z992 Dependence on renal dialysis: Secondary | ICD-10-CM | POA: Diagnosis not present

## 2023-02-16 DIAGNOSIS — N186 End stage renal disease: Secondary | ICD-10-CM | POA: Diagnosis not present

## 2023-02-16 DIAGNOSIS — Z992 Dependence on renal dialysis: Secondary | ICD-10-CM | POA: Diagnosis not present

## 2023-02-19 DIAGNOSIS — N186 End stage renal disease: Secondary | ICD-10-CM | POA: Diagnosis not present

## 2023-02-19 DIAGNOSIS — Z992 Dependence on renal dialysis: Secondary | ICD-10-CM | POA: Diagnosis not present

## 2023-02-19 DIAGNOSIS — N250 Renal osteodystrophy: Secondary | ICD-10-CM | POA: Diagnosis not present

## 2023-02-20 ENCOUNTER — Telehealth: Payer: Self-pay

## 2023-02-20 NOTE — Telephone Encounter (Signed)
Tried to reach patient to reschedule 02/27/23 PFT appt due to issue with equipment.

## 2023-02-21 ENCOUNTER — Telehealth: Payer: Self-pay | Admitting: Internal Medicine

## 2023-02-21 DIAGNOSIS — Z992 Dependence on renal dialysis: Secondary | ICD-10-CM | POA: Diagnosis not present

## 2023-02-21 DIAGNOSIS — N186 End stage renal disease: Secondary | ICD-10-CM | POA: Diagnosis not present

## 2023-02-21 NOTE — Telephone Encounter (Signed)
Lvm & sent mychart msg to move 02/27/23 appointment-Toni

## 2023-02-23 DIAGNOSIS — Z992 Dependence on renal dialysis: Secondary | ICD-10-CM | POA: Diagnosis not present

## 2023-02-23 DIAGNOSIS — N186 End stage renal disease: Secondary | ICD-10-CM | POA: Diagnosis not present

## 2023-02-26 DIAGNOSIS — Z992 Dependence on renal dialysis: Secondary | ICD-10-CM | POA: Diagnosis not present

## 2023-02-26 DIAGNOSIS — N186 End stage renal disease: Secondary | ICD-10-CM | POA: Diagnosis not present

## 2023-02-27 ENCOUNTER — Encounter: Payer: Medicare Other | Admitting: Internal Medicine

## 2023-02-28 DIAGNOSIS — N186 End stage renal disease: Secondary | ICD-10-CM | POA: Diagnosis not present

## 2023-02-28 DIAGNOSIS — Z992 Dependence on renal dialysis: Secondary | ICD-10-CM | POA: Diagnosis not present

## 2023-03-02 DIAGNOSIS — N186 End stage renal disease: Secondary | ICD-10-CM | POA: Diagnosis not present

## 2023-03-02 DIAGNOSIS — Z992 Dependence on renal dialysis: Secondary | ICD-10-CM | POA: Diagnosis not present

## 2023-03-05 DIAGNOSIS — N186 End stage renal disease: Secondary | ICD-10-CM | POA: Diagnosis not present

## 2023-03-05 DIAGNOSIS — Z992 Dependence on renal dialysis: Secondary | ICD-10-CM | POA: Diagnosis not present

## 2023-03-07 DIAGNOSIS — N186 End stage renal disease: Secondary | ICD-10-CM | POA: Diagnosis not present

## 2023-03-07 DIAGNOSIS — D509 Iron deficiency anemia, unspecified: Secondary | ICD-10-CM | POA: Diagnosis not present

## 2023-03-07 DIAGNOSIS — Z992 Dependence on renal dialysis: Secondary | ICD-10-CM | POA: Diagnosis not present

## 2023-03-08 DIAGNOSIS — Z992 Dependence on renal dialysis: Secondary | ICD-10-CM | POA: Diagnosis not present

## 2023-03-08 DIAGNOSIS — N186 End stage renal disease: Secondary | ICD-10-CM | POA: Diagnosis not present

## 2023-03-09 DIAGNOSIS — N186 End stage renal disease: Secondary | ICD-10-CM | POA: Diagnosis not present

## 2023-03-09 DIAGNOSIS — Z992 Dependence on renal dialysis: Secondary | ICD-10-CM | POA: Diagnosis not present

## 2023-03-11 ENCOUNTER — Ambulatory Visit: Payer: Medicare Other | Admitting: Nurse Practitioner

## 2023-03-12 DIAGNOSIS — Z992 Dependence on renal dialysis: Secondary | ICD-10-CM | POA: Diagnosis not present

## 2023-03-12 DIAGNOSIS — N186 End stage renal disease: Secondary | ICD-10-CM | POA: Diagnosis not present

## 2023-03-14 DIAGNOSIS — N186 End stage renal disease: Secondary | ICD-10-CM | POA: Diagnosis not present

## 2023-03-14 DIAGNOSIS — Z992 Dependence on renal dialysis: Secondary | ICD-10-CM | POA: Diagnosis not present

## 2023-03-16 DIAGNOSIS — N186 End stage renal disease: Secondary | ICD-10-CM | POA: Diagnosis not present

## 2023-03-16 DIAGNOSIS — Z992 Dependence on renal dialysis: Secondary | ICD-10-CM | POA: Diagnosis not present

## 2023-03-19 DIAGNOSIS — Z992 Dependence on renal dialysis: Secondary | ICD-10-CM | POA: Diagnosis not present

## 2023-03-19 DIAGNOSIS — N250 Renal osteodystrophy: Secondary | ICD-10-CM | POA: Diagnosis not present

## 2023-03-19 DIAGNOSIS — N186 End stage renal disease: Secondary | ICD-10-CM | POA: Diagnosis not present

## 2023-03-21 DIAGNOSIS — N186 End stage renal disease: Secondary | ICD-10-CM | POA: Diagnosis not present

## 2023-03-21 DIAGNOSIS — Z992 Dependence on renal dialysis: Secondary | ICD-10-CM | POA: Diagnosis not present

## 2023-03-23 DIAGNOSIS — Z992 Dependence on renal dialysis: Secondary | ICD-10-CM | POA: Diagnosis not present

## 2023-03-23 DIAGNOSIS — N186 End stage renal disease: Secondary | ICD-10-CM | POA: Diagnosis not present

## 2023-03-26 DIAGNOSIS — N186 End stage renal disease: Secondary | ICD-10-CM | POA: Diagnosis not present

## 2023-03-26 DIAGNOSIS — Z992 Dependence on renal dialysis: Secondary | ICD-10-CM | POA: Diagnosis not present

## 2023-03-28 DIAGNOSIS — N186 End stage renal disease: Secondary | ICD-10-CM | POA: Diagnosis not present

## 2023-03-28 DIAGNOSIS — Z992 Dependence on renal dialysis: Secondary | ICD-10-CM | POA: Diagnosis not present

## 2023-03-29 DIAGNOSIS — R269 Unspecified abnormalities of gait and mobility: Secondary | ICD-10-CM | POA: Diagnosis not present

## 2023-03-30 DIAGNOSIS — N186 End stage renal disease: Secondary | ICD-10-CM | POA: Diagnosis not present

## 2023-03-30 DIAGNOSIS — Z992 Dependence on renal dialysis: Secondary | ICD-10-CM | POA: Diagnosis not present

## 2023-04-02 DIAGNOSIS — D509 Iron deficiency anemia, unspecified: Secondary | ICD-10-CM | POA: Diagnosis not present

## 2023-04-02 DIAGNOSIS — Z794 Long term (current) use of insulin: Secondary | ICD-10-CM | POA: Diagnosis not present

## 2023-04-02 DIAGNOSIS — E119 Type 2 diabetes mellitus without complications: Secondary | ICD-10-CM | POA: Diagnosis not present

## 2023-04-02 DIAGNOSIS — N186 End stage renal disease: Secondary | ICD-10-CM | POA: Diagnosis not present

## 2023-04-02 DIAGNOSIS — Z992 Dependence on renal dialysis: Secondary | ICD-10-CM | POA: Diagnosis not present

## 2023-04-04 DIAGNOSIS — N186 End stage renal disease: Secondary | ICD-10-CM | POA: Diagnosis not present

## 2023-04-04 DIAGNOSIS — Z992 Dependence on renal dialysis: Secondary | ICD-10-CM | POA: Diagnosis not present

## 2023-04-06 DIAGNOSIS — N186 End stage renal disease: Secondary | ICD-10-CM | POA: Diagnosis not present

## 2023-04-06 DIAGNOSIS — Z992 Dependence on renal dialysis: Secondary | ICD-10-CM | POA: Diagnosis not present

## 2023-04-08 DIAGNOSIS — N186 End stage renal disease: Secondary | ICD-10-CM | POA: Diagnosis not present

## 2023-04-08 DIAGNOSIS — Z992 Dependence on renal dialysis: Secondary | ICD-10-CM | POA: Diagnosis not present

## 2023-04-09 DIAGNOSIS — Z992 Dependence on renal dialysis: Secondary | ICD-10-CM | POA: Diagnosis not present

## 2023-04-09 DIAGNOSIS — N186 End stage renal disease: Secondary | ICD-10-CM | POA: Diagnosis not present

## 2023-04-11 DIAGNOSIS — Z992 Dependence on renal dialysis: Secondary | ICD-10-CM | POA: Diagnosis not present

## 2023-04-11 DIAGNOSIS — N186 End stage renal disease: Secondary | ICD-10-CM | POA: Diagnosis not present

## 2023-04-13 DIAGNOSIS — Z992 Dependence on renal dialysis: Secondary | ICD-10-CM | POA: Diagnosis not present

## 2023-04-13 DIAGNOSIS — N186 End stage renal disease: Secondary | ICD-10-CM | POA: Diagnosis not present

## 2023-04-17 DIAGNOSIS — N186 End stage renal disease: Secondary | ICD-10-CM | POA: Diagnosis not present

## 2023-04-17 DIAGNOSIS — Z992 Dependence on renal dialysis: Secondary | ICD-10-CM | POA: Diagnosis not present

## 2023-04-18 DIAGNOSIS — Z992 Dependence on renal dialysis: Secondary | ICD-10-CM | POA: Diagnosis not present

## 2023-04-18 DIAGNOSIS — N186 End stage renal disease: Secondary | ICD-10-CM | POA: Diagnosis not present

## 2023-04-20 DIAGNOSIS — Z992 Dependence on renal dialysis: Secondary | ICD-10-CM | POA: Diagnosis not present

## 2023-04-20 DIAGNOSIS — N186 End stage renal disease: Secondary | ICD-10-CM | POA: Diagnosis not present

## 2023-04-23 DIAGNOSIS — Z992 Dependence on renal dialysis: Secondary | ICD-10-CM | POA: Diagnosis not present

## 2023-04-23 DIAGNOSIS — N186 End stage renal disease: Secondary | ICD-10-CM | POA: Diagnosis not present

## 2023-04-24 ENCOUNTER — Encounter: Payer: Medicare Other | Admitting: Internal Medicine

## 2023-04-25 DIAGNOSIS — N186 End stage renal disease: Secondary | ICD-10-CM | POA: Diagnosis not present

## 2023-04-25 DIAGNOSIS — Z992 Dependence on renal dialysis: Secondary | ICD-10-CM | POA: Diagnosis not present

## 2023-04-27 DIAGNOSIS — N186 End stage renal disease: Secondary | ICD-10-CM | POA: Diagnosis not present

## 2023-04-27 DIAGNOSIS — Z992 Dependence on renal dialysis: Secondary | ICD-10-CM | POA: Diagnosis not present

## 2023-04-29 DIAGNOSIS — R269 Unspecified abnormalities of gait and mobility: Secondary | ICD-10-CM | POA: Diagnosis not present

## 2023-04-30 DIAGNOSIS — N186 End stage renal disease: Secondary | ICD-10-CM | POA: Diagnosis not present

## 2023-04-30 DIAGNOSIS — Z992 Dependence on renal dialysis: Secondary | ICD-10-CM | POA: Diagnosis not present

## 2023-05-01 ENCOUNTER — Ambulatory Visit: Payer: Medicare Other | Admitting: Nurse Practitioner

## 2023-05-02 ENCOUNTER — Telehealth: Payer: Self-pay

## 2023-05-02 DIAGNOSIS — N186 End stage renal disease: Secondary | ICD-10-CM | POA: Diagnosis not present

## 2023-05-02 DIAGNOSIS — Z992 Dependence on renal dialysis: Secondary | ICD-10-CM | POA: Diagnosis not present

## 2023-05-02 NOTE — Telephone Encounter (Addendum)
 Spoke with  pt daughter that pt can discuss with lauren at next she should used her oxygen due to all her health condition and can discus at next visit and order ONO again we cannot just D/c oxygen without test done  and also spoke with pt husband that we have redo overnight oximetry test before d/c oxygen order

## 2023-05-04 DIAGNOSIS — N186 End stage renal disease: Secondary | ICD-10-CM | POA: Diagnosis not present

## 2023-05-04 DIAGNOSIS — Z992 Dependence on renal dialysis: Secondary | ICD-10-CM | POA: Diagnosis not present

## 2023-05-07 DIAGNOSIS — N186 End stage renal disease: Secondary | ICD-10-CM | POA: Diagnosis not present

## 2023-05-07 DIAGNOSIS — Z992 Dependence on renal dialysis: Secondary | ICD-10-CM | POA: Diagnosis not present

## 2023-05-08 DIAGNOSIS — Z992 Dependence on renal dialysis: Secondary | ICD-10-CM | POA: Diagnosis not present

## 2023-05-08 DIAGNOSIS — N186 End stage renal disease: Secondary | ICD-10-CM | POA: Diagnosis not present

## 2023-05-09 DIAGNOSIS — N186 End stage renal disease: Secondary | ICD-10-CM | POA: Diagnosis not present

## 2023-05-09 DIAGNOSIS — Z992 Dependence on renal dialysis: Secondary | ICD-10-CM | POA: Diagnosis not present

## 2023-05-11 DIAGNOSIS — Z992 Dependence on renal dialysis: Secondary | ICD-10-CM | POA: Diagnosis not present

## 2023-05-11 DIAGNOSIS — N186 End stage renal disease: Secondary | ICD-10-CM | POA: Diagnosis not present

## 2023-05-14 DIAGNOSIS — Z992 Dependence on renal dialysis: Secondary | ICD-10-CM | POA: Diagnosis not present

## 2023-05-14 DIAGNOSIS — N186 End stage renal disease: Secondary | ICD-10-CM | POA: Diagnosis not present

## 2023-05-16 DIAGNOSIS — N186 End stage renal disease: Secondary | ICD-10-CM | POA: Diagnosis not present

## 2023-05-16 DIAGNOSIS — Z992 Dependence on renal dialysis: Secondary | ICD-10-CM | POA: Diagnosis not present

## 2023-05-18 DIAGNOSIS — N186 End stage renal disease: Secondary | ICD-10-CM | POA: Diagnosis not present

## 2023-05-18 DIAGNOSIS — Z992 Dependence on renal dialysis: Secondary | ICD-10-CM | POA: Diagnosis not present

## 2023-05-20 IMAGING — DX DG KNEE 1-2V*L*
2 series · 2 of 2 positions shown · non-contrast
Comparison: None.

CLINICAL DATA: Bilateral knee pain.

EXAM:
LEFT KNEE - 1-2 VIEW

[knee ap]
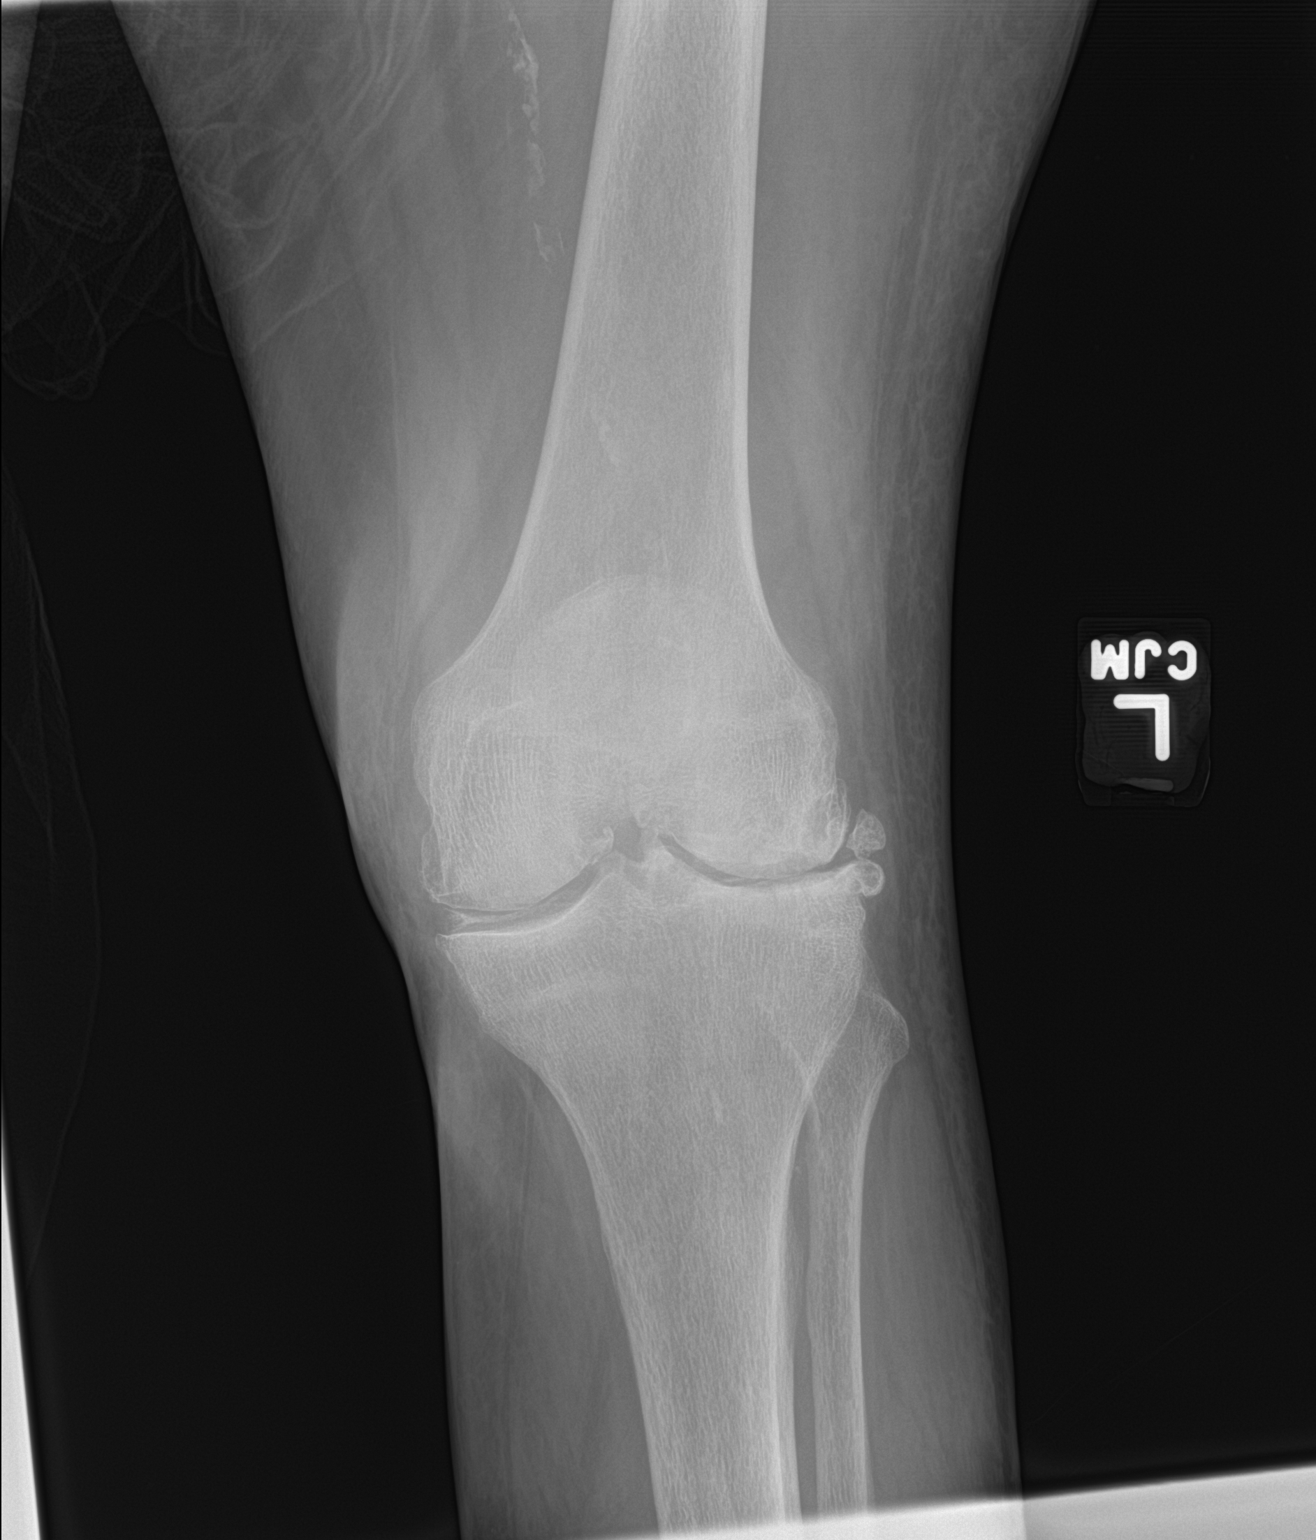

[knee lat]
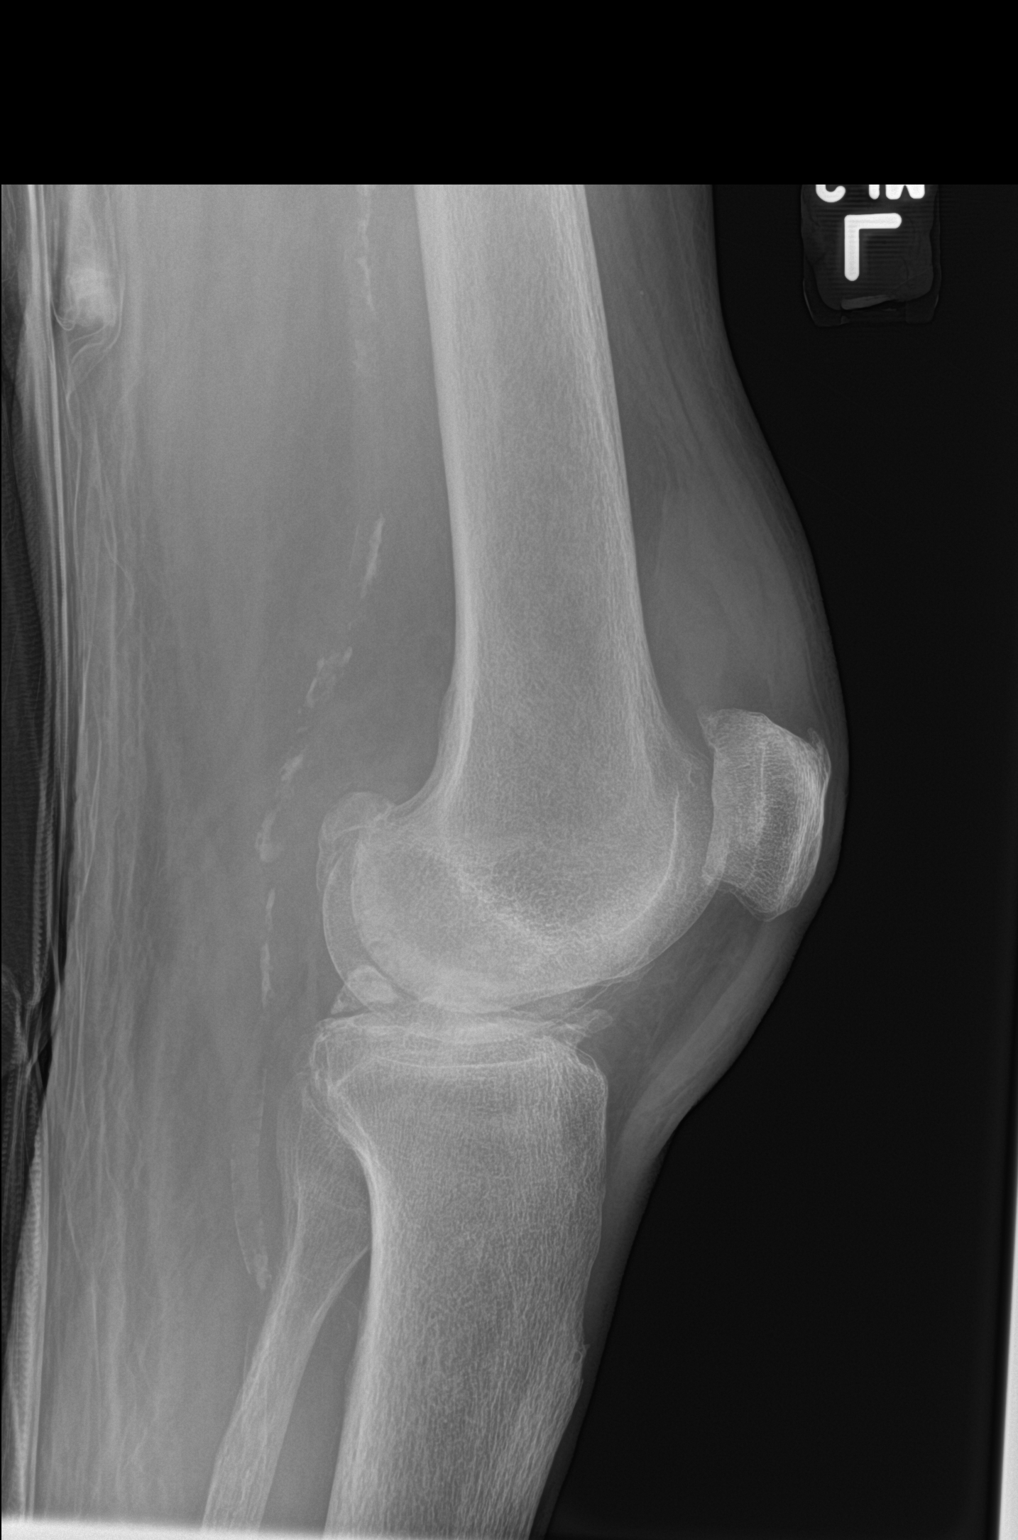

[2 of 2 positions shown; findings below may reference images not displayed]

FINDINGS: No fracture or dislocation is noted. Small suprapatellar joint
effusion is noted. Moderate narrowing of the medial and lateral
joint spaces are noted.
IMPRESSION: Moderate degenerative joint disease. Small suprapatellar joint
effusion. No fracture or dislocation is noted.

## 2023-05-21 DIAGNOSIS — N186 End stage renal disease: Secondary | ICD-10-CM | POA: Diagnosis not present

## 2023-05-21 DIAGNOSIS — Z992 Dependence on renal dialysis: Secondary | ICD-10-CM | POA: Diagnosis not present

## 2023-05-21 IMAGING — US US EXTREM UP VEIN MAPPING UNILAT
2 series · 14 of 24 positions shown · non-contrast
Comparison: None.

CLINICAL DATA: Preop evaluation prior to AV fistula creation

EXAM:
US EXTREM UP VEIN MAPPING

[Series 1: us ue vein mapping left (pre-op avf) · non-contrast · 8 of 28 slices shown (1 of 2)]
[im 1/28]
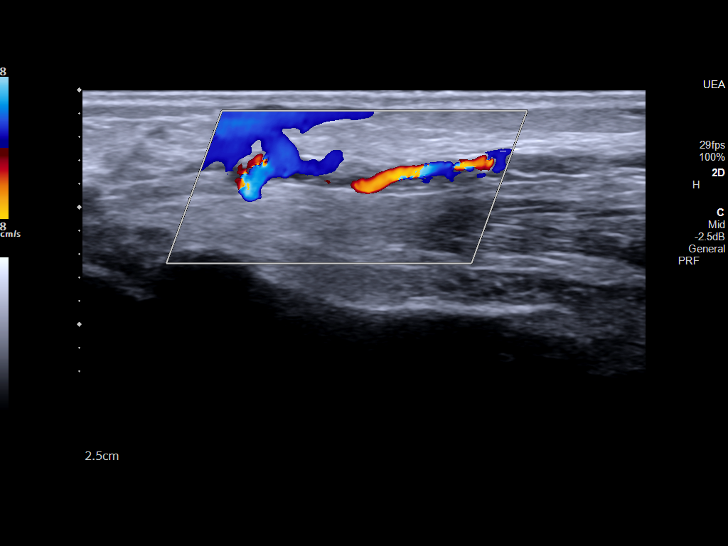
[im 5/28]
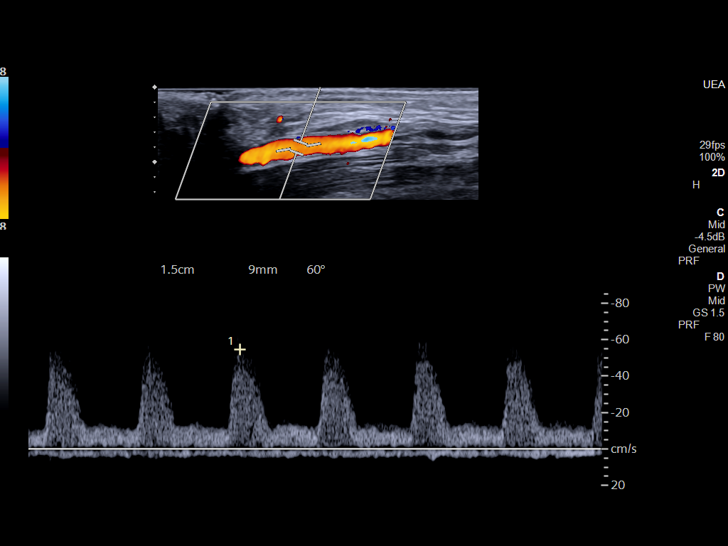
[im 9/28]
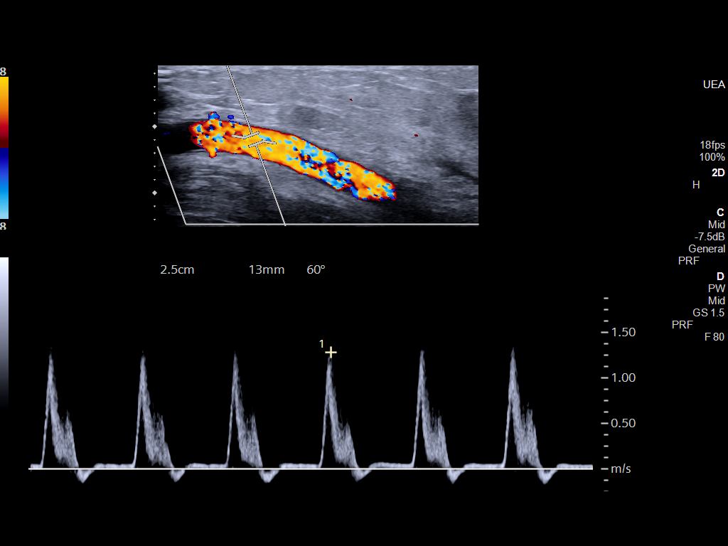
[im 13/28]
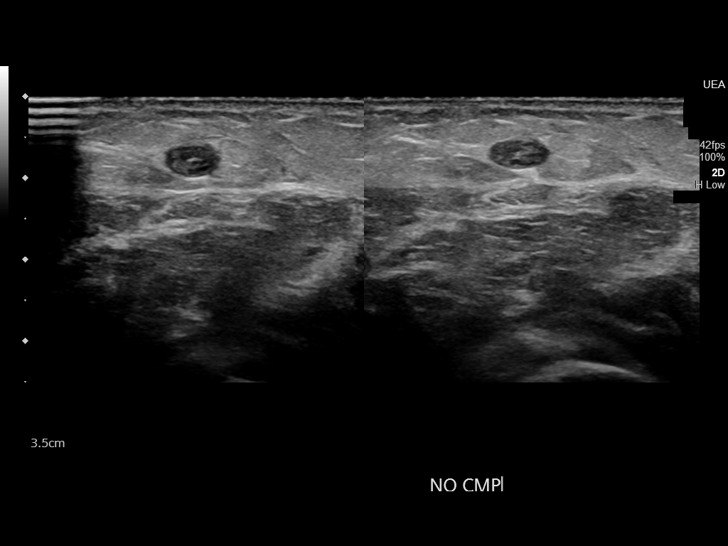
[im 15/28]
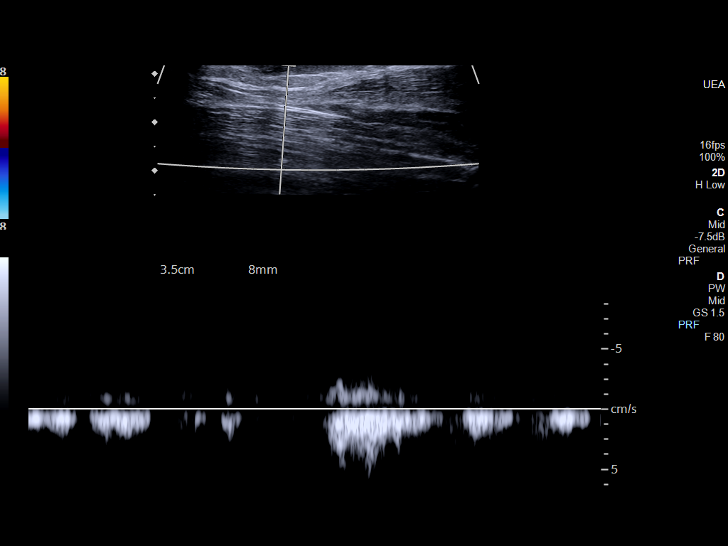
[im 19/28]
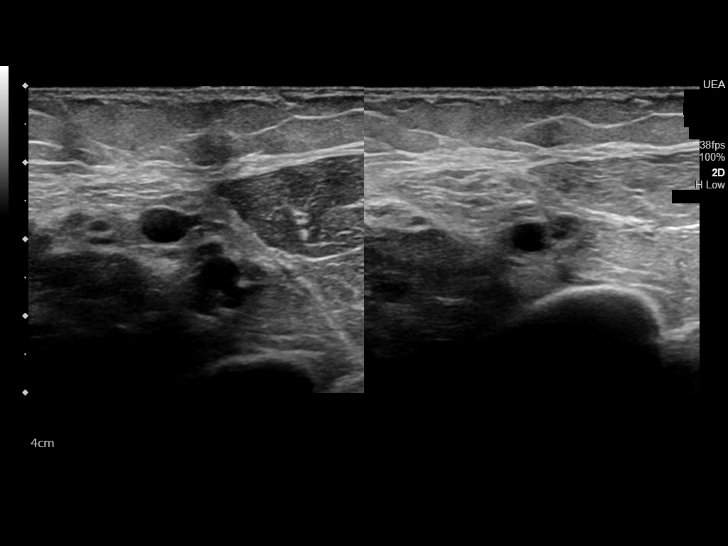
[im 23/28]
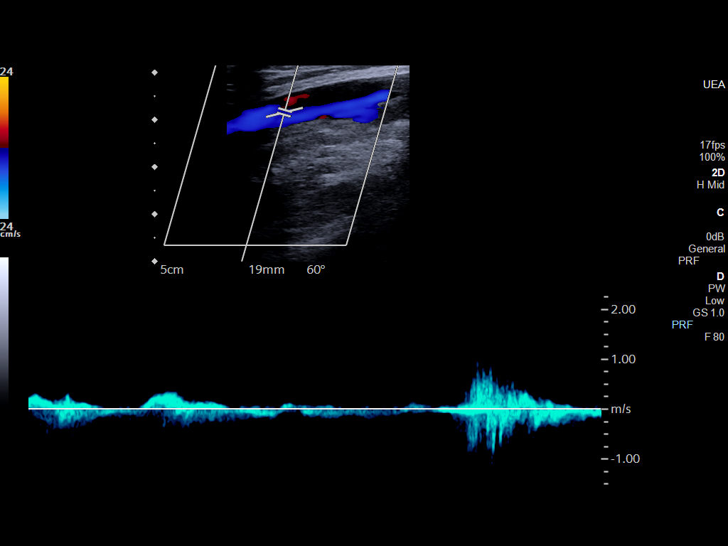
[im 25/28]
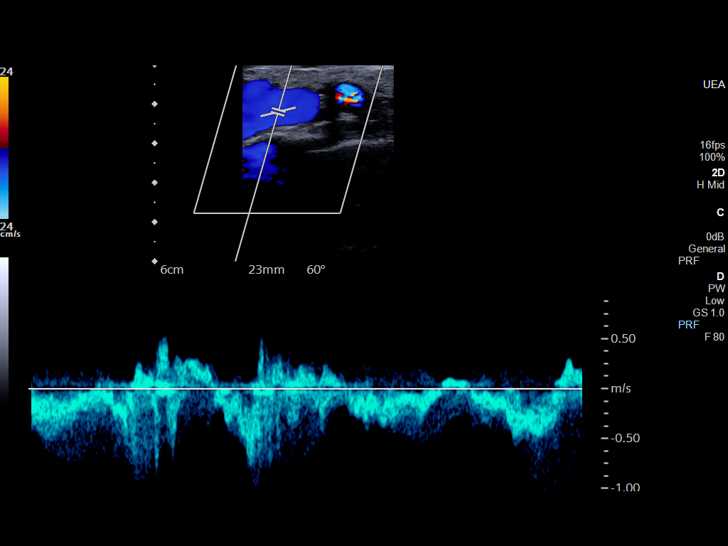

[Series 1002: us ue vein mapping left (pre-op avf) · non-contrast · 6 of 19 slices shown (2 of 2)]
[im 1/19]
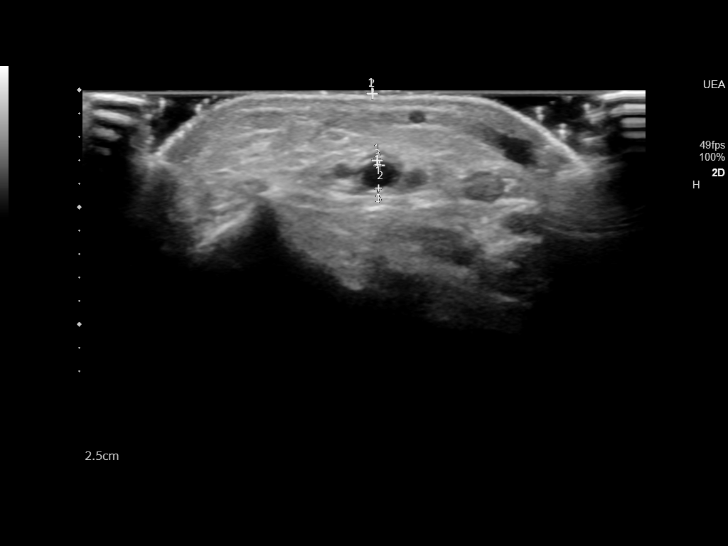
[im 5/19]
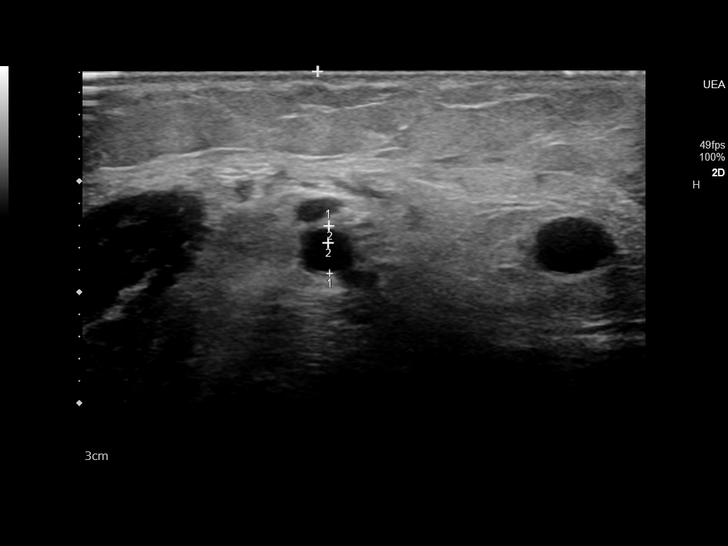
[im 9/19]
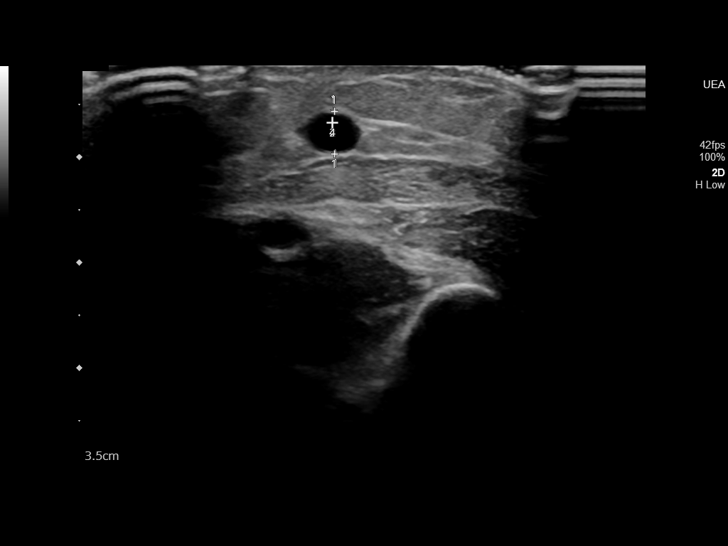
[im 11/19]
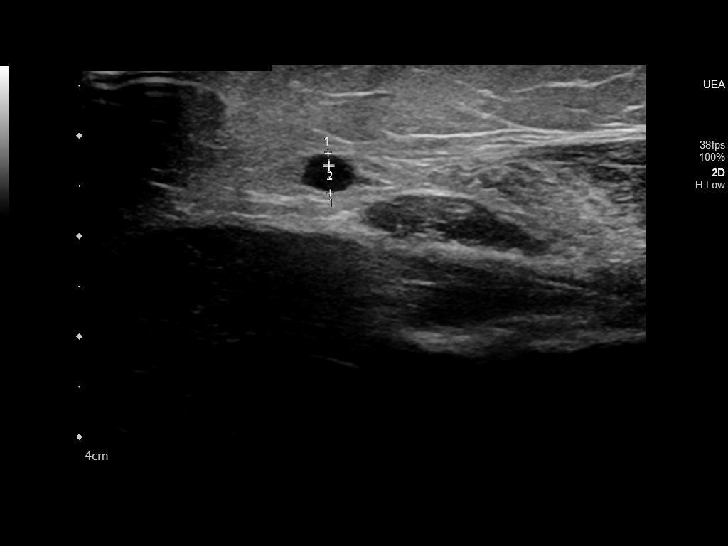
[im 15/19]
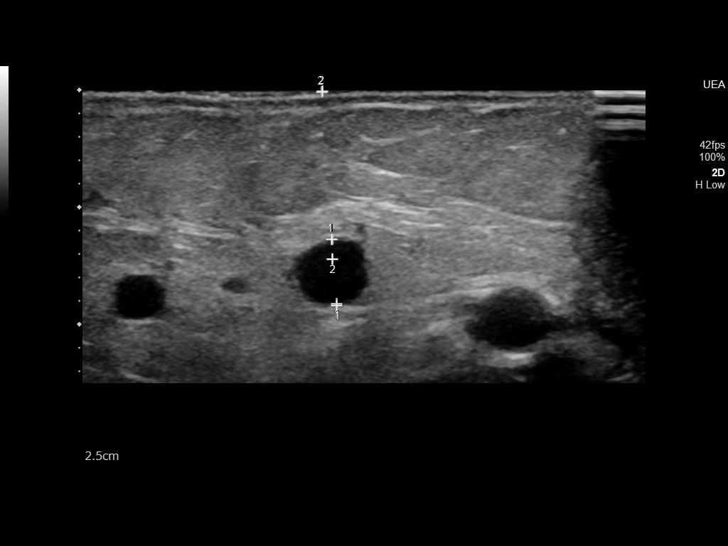
[im 19/19]
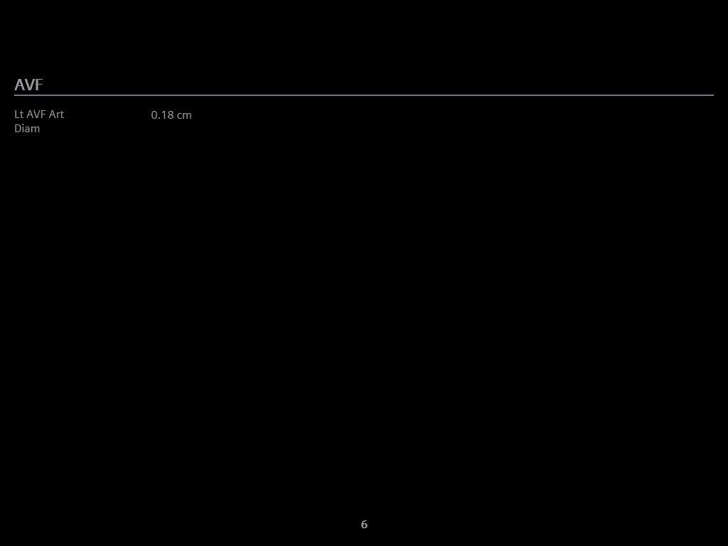

[14 of 24 positions shown; findings below may reference images not displayed]

FINDINGS: LEFT ARTERIES

Wrist Radial Artery:

Size 3mm Waveform monophasic

Wrist Ulnar Artery:

Size 2mm Waveform monophasic

Prox. Forearm Radial Artery:

Size 3mm Waveform triphasic

Upper Arm Brachial Artery:

Size 4mm Waveform triphasic

LEFT VEINS

Forearm Cephalic Vein:

Prox 7mm Distal 2mm Depth 2-4mm

Upper Arm Cephalic Vein:

Prox thrombosed distal thrombosed depth 12mm

Upper Arm Basilic Vein:

Prox 7mm Distal 6mm Depth 14-16mm

Upper Arm Brachial Vein:

Prox 2mm Distal 2mm Depth 14-29mm

ADDITIONAL LEFT VEINS

Axillary Vein:  8mm

Subclavian Vein:

Patient: Yes Respiratory Phasicity: Present

Internal Jugular Vein:

Patent: Yes    Respiratory Phasicity: Present
IMPRESSION: 1. Monophasic flow seen in the radial and ulnar arteries at the
level of the wrist.
2. Upper arm cephalic vein is thrombosed.

## 2023-05-21 IMAGING — US US RENAL
1 series · 14 of 25 positions shown · non-contrast
Comparison: None

CLINICAL DATA: Acute kidney injury, history diabetes mellitus,
COPD, discoid lupus, hypertension, coronary artery disease, former
smoker, breast cancer

EXAM:
RENAL / URINARY TRACT ULTRASOUND COMPLETE

[Series 1: us renal · 14 of 41 slices shown]
[im 1/41]
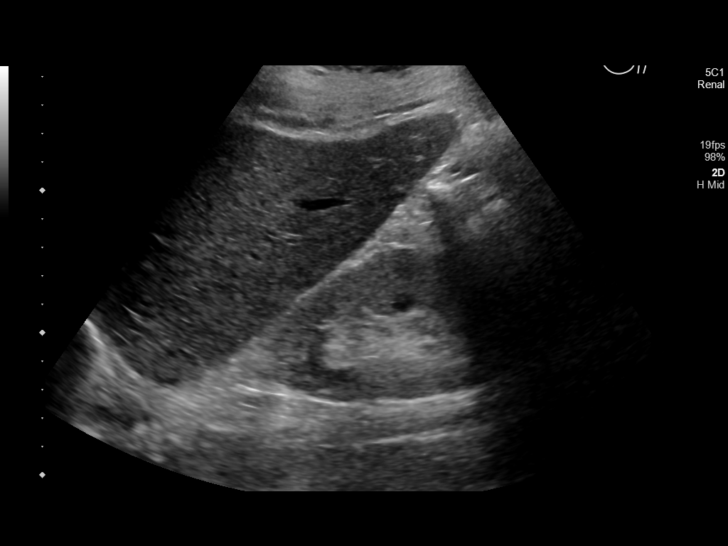
[im 4/41]
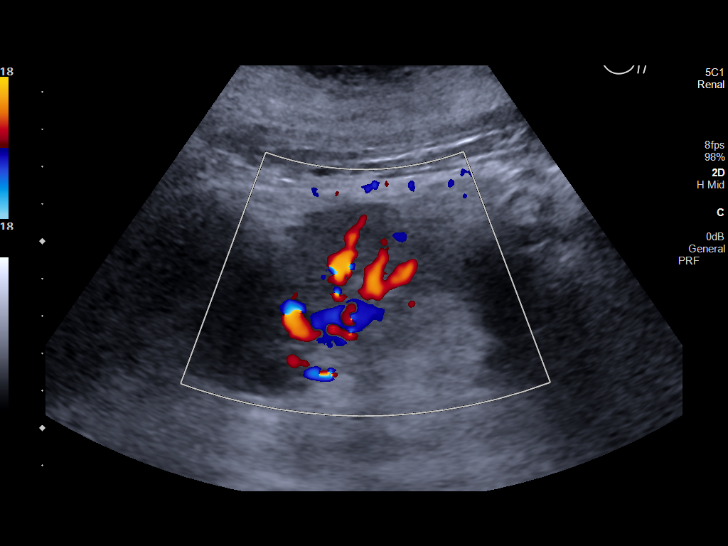
[im 7/41]
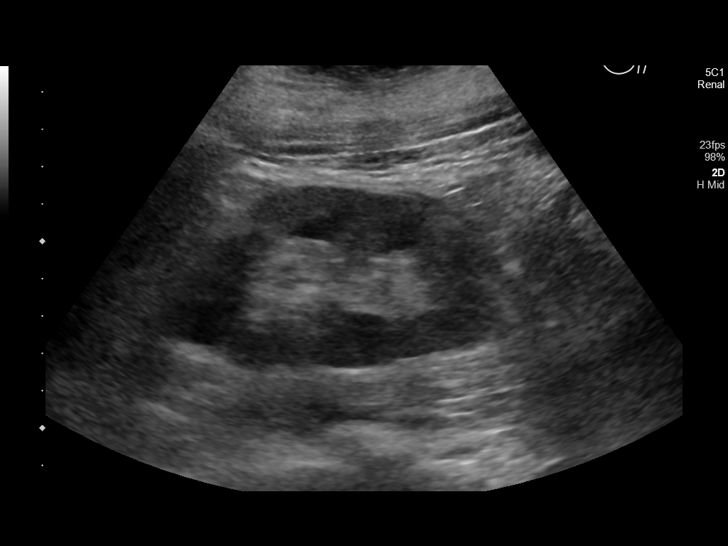
[im 11/41]
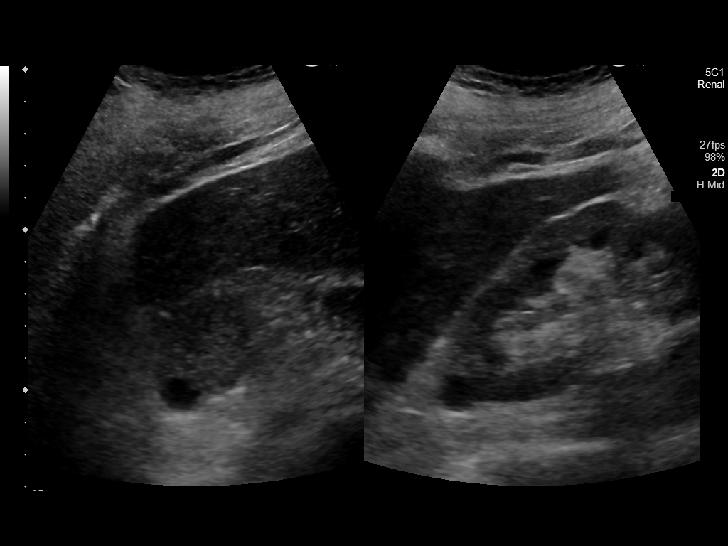
[im 14/41]
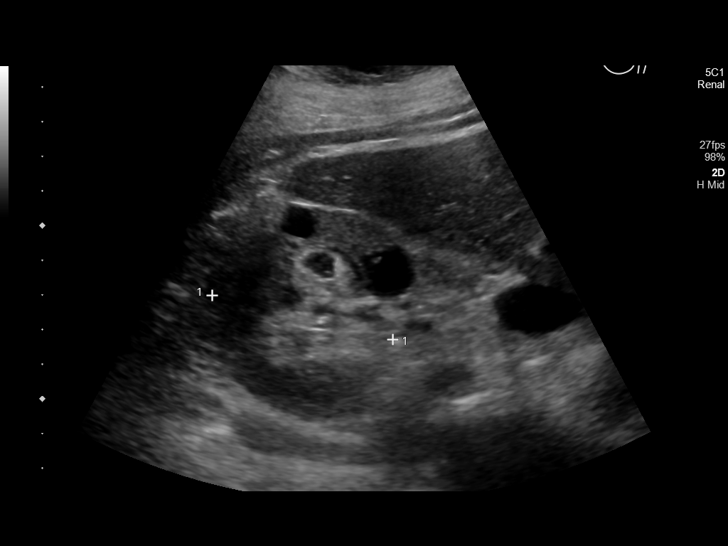
[im 16/41]
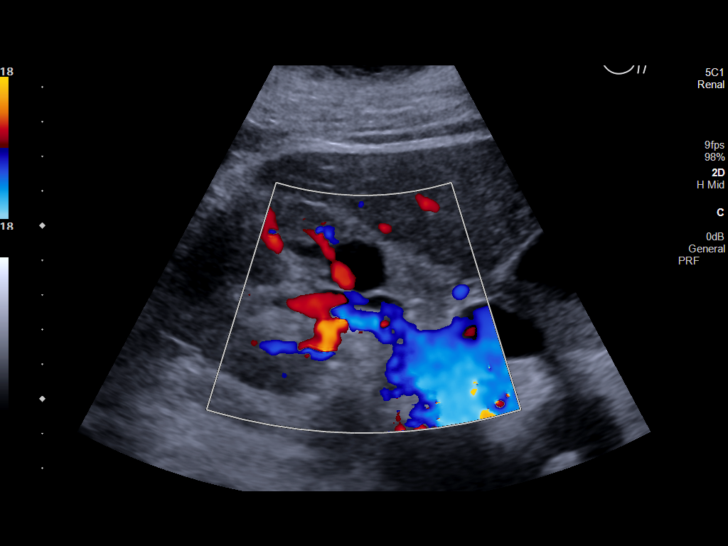
[im 19/41]
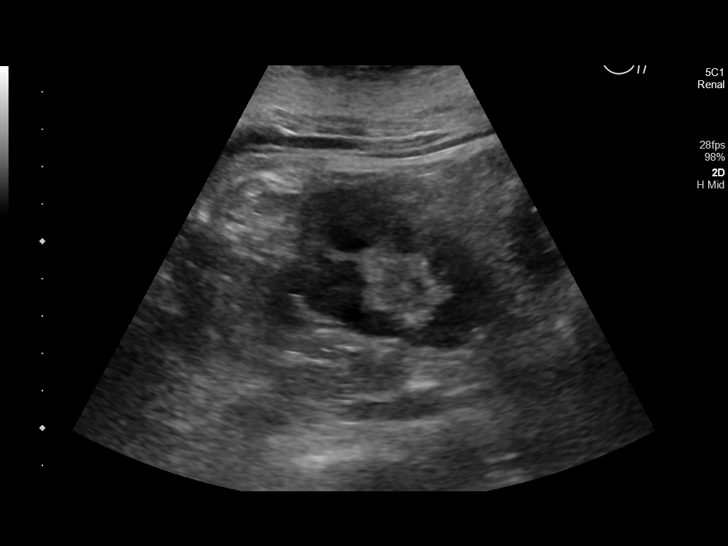
[im 22/41]
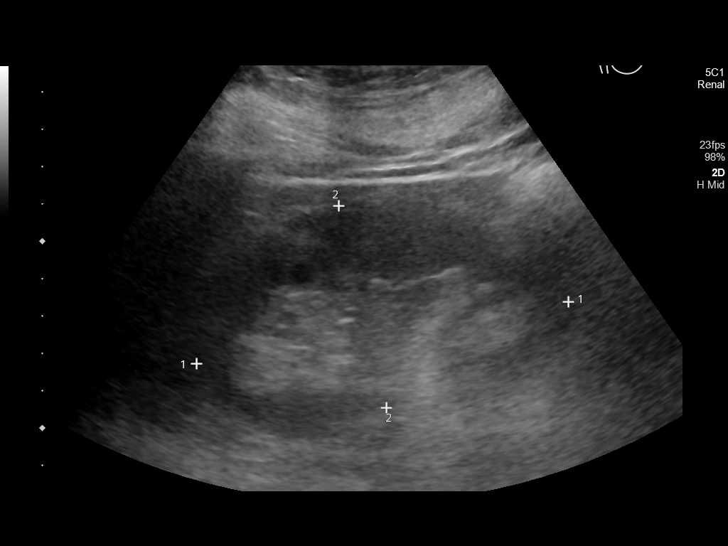
[im 26/41]
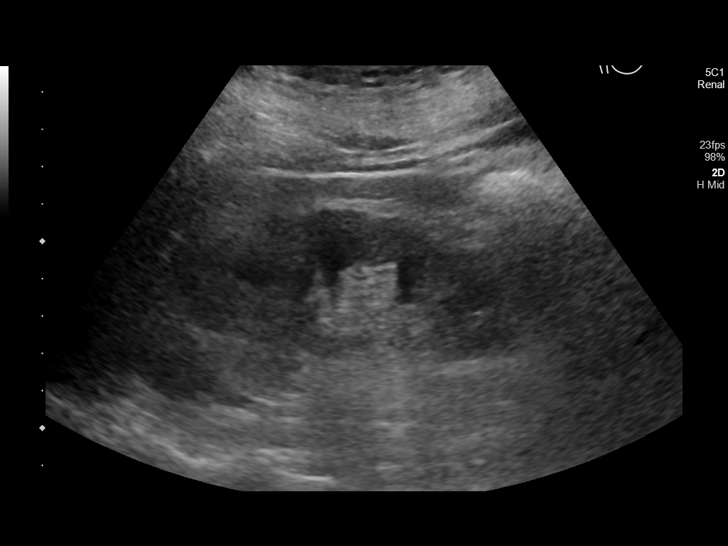
[im 27/41]
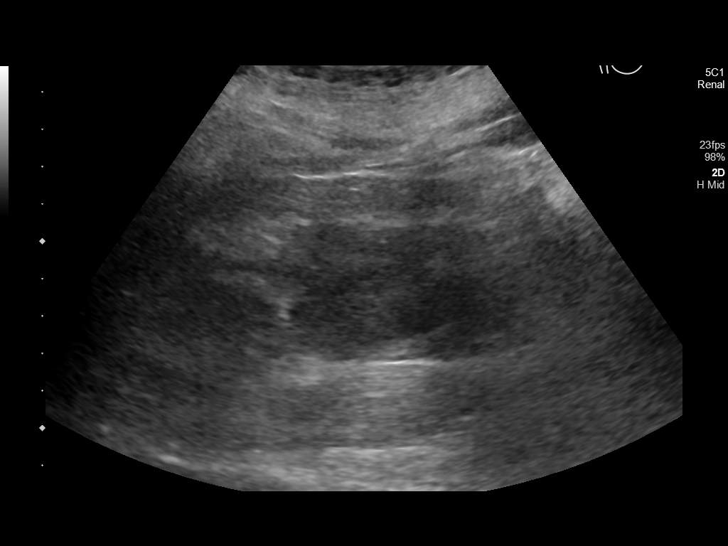
[im 31/41]
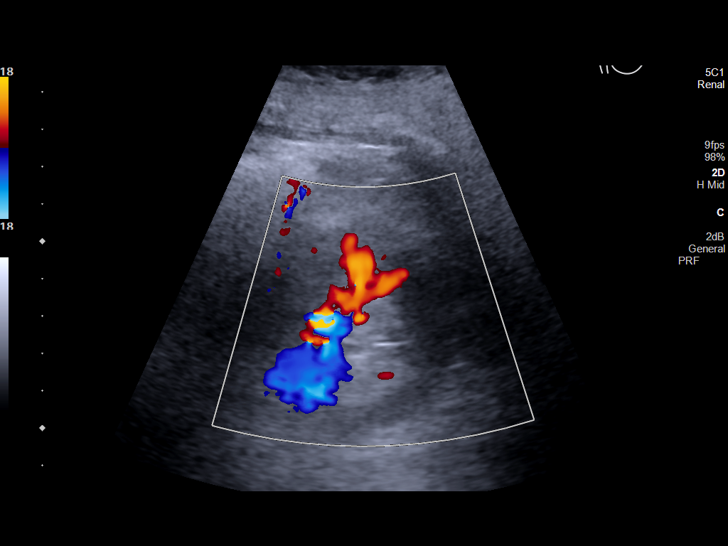
[im 34/41]
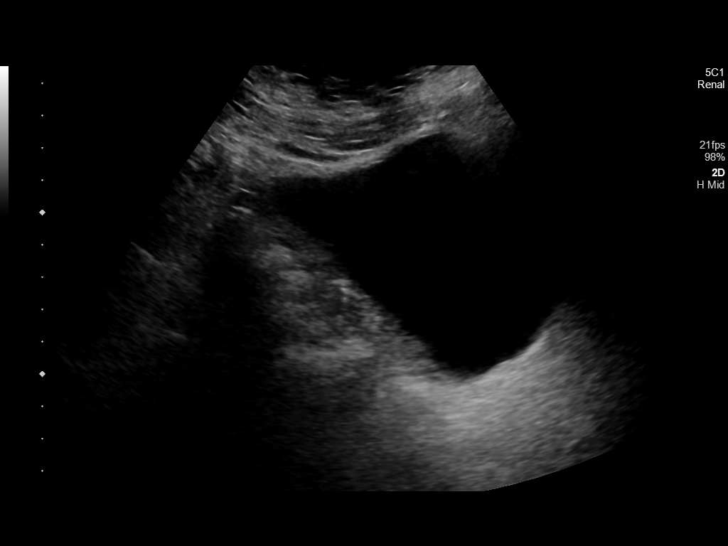
[im 37/41]
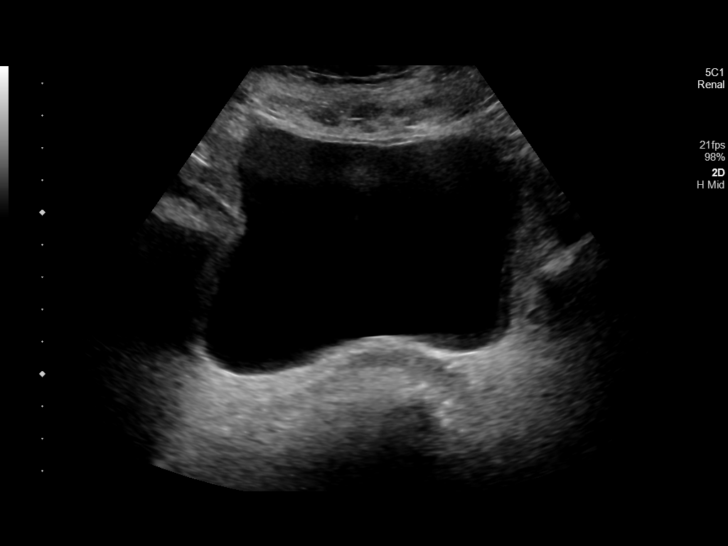
[im 41/41]
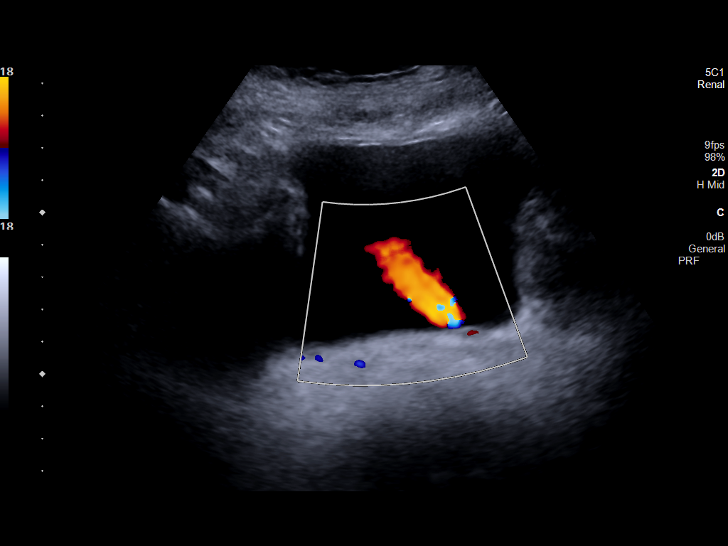

[14 of 25 positions shown; findings below may reference images not displayed]

FINDINGS: Right Kidney:

Renal measurements: 10.1 x 5.0 x 5.4 cm = volume: 140 mL. Normal
cortical thickness. Slightly increased cortical echogenicity. Tiny
cyst at upper pole 13 x 12 x 12 mm. Small cyst mid kidney 18 x 17 x
16 mm. Tiny cyst mid kidney 11 mm diameter. No solid mass or
hydronephrosis. No shadowing calcifications.

Left Kidney:

Renal measurements: 10.1 x 5.6 x 5.3 cm = volume: 153 mL. Normal
cortical thickness. Increased cortical echogenicity. No mass,
hydronephrosis, or shadowing calcification.

Bladder:

Appears normal for degree of bladder distention. LEFT ureteral jet
was visualized.

Other:

N/A
IMPRESSION: Medical renal disease changes of both kidneys.

Small RIGHT renal cysts.

## 2023-05-23 DIAGNOSIS — Z992 Dependence on renal dialysis: Secondary | ICD-10-CM | POA: Diagnosis not present

## 2023-05-23 DIAGNOSIS — N186 End stage renal disease: Secondary | ICD-10-CM | POA: Diagnosis not present

## 2023-05-25 ENCOUNTER — Other Ambulatory Visit: Payer: Self-pay

## 2023-05-25 ENCOUNTER — Encounter: Payer: Self-pay | Admitting: Internal Medicine

## 2023-05-25 ENCOUNTER — Emergency Department

## 2023-05-25 ENCOUNTER — Inpatient Hospital Stay
Admission: EM | Admit: 2023-05-25 | Discharge: 2023-05-28 | DRG: 377 | Disposition: A | Discharge status: Home Health | Attending: Internal Medicine | Admitting: Internal Medicine

## 2023-05-25 DIAGNOSIS — N186 End stage renal disease: Secondary | ICD-10-CM

## 2023-05-25 DIAGNOSIS — Z6828 Body mass index (BMI) 28.0-28.9, adult: Secondary | ICD-10-CM

## 2023-05-25 DIAGNOSIS — Z7902 Long term (current) use of antithrombotics/antiplatelets: Secondary | ICD-10-CM

## 2023-05-25 DIAGNOSIS — Z79899 Other long term (current) drug therapy: Secondary | ICD-10-CM

## 2023-05-25 DIAGNOSIS — K31819 Angiodysplasia of stomach and duodenum without bleeding: Secondary | ICD-10-CM | POA: Diagnosis not present

## 2023-05-25 DIAGNOSIS — E1151 Type 2 diabetes mellitus with diabetic peripheral angiopathy without gangrene: Secondary | ICD-10-CM | POA: Diagnosis present

## 2023-05-25 DIAGNOSIS — L930 Discoid lupus erythematosus: Secondary | ICD-10-CM | POA: Diagnosis not present

## 2023-05-25 DIAGNOSIS — E876 Hypokalemia: Secondary | ICD-10-CM | POA: Insufficient documentation

## 2023-05-25 DIAGNOSIS — Z9981 Dependence on supplemental oxygen: Secondary | ICD-10-CM

## 2023-05-25 DIAGNOSIS — K922 Gastrointestinal hemorrhage, unspecified: Secondary | ICD-10-CM | POA: Insufficient documentation

## 2023-05-25 DIAGNOSIS — Z833 Family history of diabetes mellitus: Secondary | ICD-10-CM

## 2023-05-25 DIAGNOSIS — E872 Acidosis, unspecified: Secondary | ICD-10-CM | POA: Diagnosis not present

## 2023-05-25 DIAGNOSIS — N2581 Secondary hyperparathyroidism of renal origin: Secondary | ICD-10-CM | POA: Diagnosis present

## 2023-05-25 DIAGNOSIS — I2510 Atherosclerotic heart disease of native coronary artery without angina pectoris: Secondary | ICD-10-CM | POA: Diagnosis not present

## 2023-05-25 DIAGNOSIS — J449 Chronic obstructive pulmonary disease, unspecified: Secondary | ICD-10-CM | POA: Diagnosis present

## 2023-05-25 DIAGNOSIS — I120 Hypertensive chronic kidney disease with stage 5 chronic kidney disease or end stage renal disease: Secondary | ICD-10-CM | POA: Diagnosis not present

## 2023-05-25 DIAGNOSIS — K921 Melena: Secondary | ICD-10-CM | POA: Diagnosis present

## 2023-05-25 DIAGNOSIS — Z860101 Personal history of adenomatous and serrated colon polyps: Secondary | ICD-10-CM

## 2023-05-25 DIAGNOSIS — Z955 Presence of coronary angioplasty implant and graft: Secondary | ICD-10-CM | POA: Diagnosis not present

## 2023-05-25 DIAGNOSIS — D62 Acute posthemorrhagic anemia: Secondary | ICD-10-CM | POA: Insufficient documentation

## 2023-05-25 DIAGNOSIS — Z8249 Family history of ischemic heart disease and other diseases of the circulatory system: Secondary | ICD-10-CM

## 2023-05-25 DIAGNOSIS — E785 Hyperlipidemia, unspecified: Secondary | ICD-10-CM | POA: Diagnosis present

## 2023-05-25 DIAGNOSIS — K5730 Diverticulosis of large intestine without perforation or abscess without bleeding: Secondary | ICD-10-CM | POA: Diagnosis present

## 2023-05-25 DIAGNOSIS — Z8673 Personal history of transient ischemic attack (TIA), and cerebral infarction without residual deficits: Secondary | ICD-10-CM

## 2023-05-25 DIAGNOSIS — Z794 Long term (current) use of insulin: Secondary | ICD-10-CM | POA: Diagnosis not present

## 2023-05-25 DIAGNOSIS — R0602 Shortness of breath: Secondary | ICD-10-CM | POA: Diagnosis not present

## 2023-05-25 DIAGNOSIS — K219 Gastro-esophageal reflux disease without esophagitis: Secondary | ICD-10-CM | POA: Diagnosis present

## 2023-05-25 DIAGNOSIS — D649 Anemia, unspecified: Secondary | ICD-10-CM | POA: Diagnosis not present

## 2023-05-25 DIAGNOSIS — T45525A Adverse effect of antithrombotic drugs, initial encounter: Secondary | ICD-10-CM | POA: Diagnosis present

## 2023-05-25 DIAGNOSIS — I959 Hypotension, unspecified: Secondary | ICD-10-CM | POA: Diagnosis not present

## 2023-05-25 DIAGNOSIS — K279 Peptic ulcer, site unspecified, unspecified as acute or chronic, without hemorrhage or perforation: Secondary | ICD-10-CM | POA: Diagnosis present

## 2023-05-25 DIAGNOSIS — Z801 Family history of malignant neoplasm of trachea, bronchus and lung: Secondary | ICD-10-CM

## 2023-05-25 DIAGNOSIS — Z888 Allergy status to other drugs, medicaments and biological substances status: Secondary | ICD-10-CM

## 2023-05-25 DIAGNOSIS — F0390 Unspecified dementia without behavioral disturbance: Secondary | ICD-10-CM | POA: Diagnosis present

## 2023-05-25 DIAGNOSIS — E663 Overweight: Secondary | ICD-10-CM | POA: Diagnosis not present

## 2023-05-25 DIAGNOSIS — J9611 Chronic respiratory failure with hypoxia: Secondary | ICD-10-CM | POA: Diagnosis present

## 2023-05-25 DIAGNOSIS — E1122 Type 2 diabetes mellitus with diabetic chronic kidney disease: Secondary | ICD-10-CM | POA: Diagnosis not present

## 2023-05-25 DIAGNOSIS — Z992 Dependence on renal dialysis: Secondary | ICD-10-CM | POA: Diagnosis not present

## 2023-05-25 DIAGNOSIS — Z806 Family history of leukemia: Secondary | ICD-10-CM

## 2023-05-25 DIAGNOSIS — D631 Anemia in chronic kidney disease: Secondary | ICD-10-CM | POA: Diagnosis present

## 2023-05-25 DIAGNOSIS — I10 Essential (primary) hypertension: Secondary | ICD-10-CM | POA: Diagnosis present

## 2023-05-25 DIAGNOSIS — R531 Weakness: Secondary | ICD-10-CM | POA: Diagnosis present

## 2023-05-25 DIAGNOSIS — R918 Other nonspecific abnormal finding of lung field: Secondary | ICD-10-CM | POA: Diagnosis not present

## 2023-05-25 DIAGNOSIS — K648 Other hemorrhoids: Secondary | ICD-10-CM | POA: Diagnosis present

## 2023-05-25 DIAGNOSIS — K31811 Angiodysplasia of stomach and duodenum with bleeding: Secondary | ICD-10-CM | POA: Diagnosis not present

## 2023-05-25 DIAGNOSIS — Z87891 Personal history of nicotine dependence: Secondary | ICD-10-CM

## 2023-05-25 LAB — COMPREHENSIVE METABOLIC PANEL WITH GFR
ALT: 37 U/L (ref 0–44)
AST: 36 U/L (ref 15–41)
Albumin: 3.7 g/dL (ref 3.5–5.0)
Alkaline Phosphatase: 75 U/L (ref 38–126)
Anion gap: 13 (ref 5–15)
BUN: 28 mg/dL — ABNORMAL HIGH (ref 8–23)
CO2: 24 mmol/L (ref 22–32)
Calcium: 9 mg/dL (ref 8.9–10.3)
Chloride: 99 mmol/L (ref 98–111)
Creatinine, Ser: 3.22 mg/dL — ABNORMAL HIGH (ref 0.44–1.00)
GFR, Estimated: 13 mL/min — ABNORMAL LOW (ref >60)
Glucose, Bld: 134 mg/dL — ABNORMAL HIGH (ref 70–99)
Potassium: 3.3 mmol/L — ABNORMAL LOW (ref 3.5–5.1)
Sodium: 136 mmol/L (ref 135–145)
Total Bilirubin: 0.9 mg/dL (ref 0.0–1.2)
Total Protein: 7.7 g/dL (ref 6.5–8.1)

## 2023-05-25 LAB — CBC WITH DIFFERENTIAL/PLATELET
Abs Immature Granulocytes: 0.03 10*3/uL (ref 0.00–0.07)
Basophils Absolute: 0.1 10*3/uL (ref 0.0–0.1)
Basophils Relative: 1 %
Eosinophils Absolute: 0.1 10*3/uL (ref 0.0–0.5)
Eosinophils Relative: 1 %
HCT: 22.7 % — ABNORMAL LOW (ref 36.0–46.0)
Hemoglobin: 6.7 g/dL — ABNORMAL LOW (ref 12.0–15.0)
Immature Granulocytes: 0 %
Lymphocytes Relative: 15 %
Lymphs Abs: 1.3 10*3/uL (ref 0.7–4.0)
MCH: 26.4 pg (ref 26.0–34.0)
MCHC: 29.5 g/dL — ABNORMAL LOW (ref 30.0–36.0)
MCV: 89.4 fL (ref 80.0–100.0)
Monocytes Absolute: 0.7 10*3/uL (ref 0.1–1.0)
Monocytes Relative: 8 %
Neutro Abs: 6 10*3/uL (ref 1.7–7.7)
Neutrophils Relative %: 75 %
Platelets: 270 10*3/uL (ref 150–400)
RBC: 2.54 MIL/uL — ABNORMAL LOW (ref 3.87–5.11)
RDW: 19.9 % — ABNORMAL HIGH (ref 11.5–15.5)
WBC: 8.2 10*3/uL (ref 4.0–10.5)
nRBC: 0 % (ref 0.0–0.2)

## 2023-05-25 LAB — IRON AND TIBC
Iron: 37 ug/dL (ref 28–170)
Saturation Ratios: 10 % — ABNORMAL LOW (ref 10.4–31.8)
TIBC: 385 ug/dL (ref 250–450)
UIBC: 348 ug/dL

## 2023-05-25 LAB — HEMOGLOBIN AND HEMATOCRIT, BLOOD
HCT: 22.1 % — ABNORMAL LOW (ref 36.0–46.0)
Hemoglobin: 6.9 g/dL — ABNORMAL LOW (ref 12.0–15.0)

## 2023-05-25 LAB — TROPONIN I (HIGH SENSITIVITY)
Troponin I (High Sensitivity): 32 ng/L — ABNORMAL HIGH (ref <18)
Troponin I (High Sensitivity): 30 ng/L — ABNORMAL HIGH (ref <18)

## 2023-05-25 LAB — RETICULOCYTES
Immature Retic Fract: 43.5 % — ABNORMAL HIGH (ref 2.3–15.9)
RBC.: 2.54 MIL/uL — ABNORMAL LOW (ref 3.87–5.11)
Retic Count, Absolute: 166.9 10*3/uL (ref 19.0–186.0)
Retic Ct Pct: 6.6 % — ABNORMAL HIGH (ref 0.4–3.1)

## 2023-05-25 LAB — HEMOGLOBIN A1C
Hgb A1c MFr Bld: 3.9 % — ABNORMAL LOW (ref 4.8–5.6)
Mean Plasma Glucose: 65.23 mg/dL

## 2023-05-25 LAB — PROTIME-INR
INR: 1 (ref 0.8–1.2)
Prothrombin Time: 13.6 s (ref 11.4–15.2)

## 2023-05-25 LAB — CBG MONITORING, ED
Glucose-Capillary: 107 mg/dL — ABNORMAL HIGH (ref 70–99)
Glucose-Capillary: 102 mg/dL — ABNORMAL HIGH (ref 70–99)

## 2023-05-25 LAB — FERRITIN: Ferritin: 130 ng/mL (ref 11–307)

## 2023-05-25 LAB — PREPARE RBC (CROSSMATCH)

## 2023-05-25 LAB — APTT: aPTT: 30 s (ref 24–36)

## 2023-05-25 MED ORDER — HYDRALAZINE HCL 20 MG/ML IJ SOLN
5.0000 mg | Freq: Four times a day (QID) | INTRAMUSCULAR | Status: DC | PRN
  Administered 2023-05-27: 5 mg via INTRAVENOUS
  Filled 2023-05-25: qty 1

## 2023-05-25 MED ORDER — PANTOPRAZOLE SODIUM 40 MG IV SOLR
40.0000 mg | Freq: Once | INTRAVENOUS | Status: AC
  Administered 2023-05-25: 40 mg via INTRAVENOUS
  Filled 2023-05-25: qty 10

## 2023-05-25 MED ORDER — ONDANSETRON HCL 4 MG/2ML IJ SOLN
4.0000 mg | Freq: Four times a day (QID) | INTRAMUSCULAR | Status: DC | PRN
  Administered 2023-05-28: 4 mg via INTRAVENOUS

## 2023-05-25 MED ORDER — SODIUM CHLORIDE 0.9% IV SOLUTION
Freq: Once | INTRAVENOUS | Status: AC
  Filled 2023-05-25: qty 250

## 2023-05-25 MED ORDER — DOCUSATE SODIUM 100 MG PO CAPS: 200.0000 mg | ORAL_CAPSULE | Freq: Every evening | ORAL | Status: DC | PRN

## 2023-05-25 MED ORDER — BUDESON-GLYCOPYRROL-FORMOTEROL 160-9-4.8 MCG/ACT IN AERO
2.0000 | INHALATION_SPRAY | Freq: Two times a day (BID) | RESPIRATORY_TRACT | Status: DC
Start: 2023-05-25 — End: 2023-05-29
  Administered 2023-05-25 – 2023-05-28 (×6): 2 via RESPIRATORY_TRACT
  Filled 2023-05-25: qty 5.9

## 2023-05-25 MED ORDER — ATORVASTATIN CALCIUM 20 MG PO TABS
40.0000 mg | ORAL_TABLET | Freq: Every day | ORAL | Status: DC
  Administered 2023-05-26 – 2023-05-28 (×3): 40 mg via ORAL
  Filled 2023-05-25 (×3): qty 2

## 2023-05-25 MED ORDER — FERROUS SULFATE 325 (65 FE) MG PO TABS
325.0000 mg | ORAL_TABLET | Freq: Every day | ORAL | Status: DC
Start: 2023-05-26 — End: 2023-05-29
  Administered 2023-05-26 – 2023-05-28 (×3): 325 mg via ORAL
  Filled 2023-05-25 (×3): qty 1

## 2023-05-25 MED ORDER — ACETAMINOPHEN 500 MG PO TABS
500.0000 mg | ORAL_TABLET | Freq: Four times a day (QID) | ORAL | Status: DC | PRN
  Administered 2023-05-26 – 2023-05-28 (×4): 500 mg via ORAL
  Filled 2023-05-25 (×4): qty 1

## 2023-05-25 MED ORDER — ONDANSETRON HCL 4 MG PO TABS: 4.0000 mg | ORAL_TABLET | Freq: Four times a day (QID) | ORAL | Status: DC | PRN

## 2023-05-25 MED ORDER — INSULIN ASPART 100 UNIT/ML IJ SOLN: 0.0000 [IU] | Freq: Every day | INTRAMUSCULAR | Status: DC

## 2023-05-25 MED ORDER — DONEPEZIL HCL 5 MG PO TABS
10.0000 mg | ORAL_TABLET | Freq: Every day | ORAL | Status: DC
  Administered 2023-05-25 – 2023-05-27 (×3): 10 mg via ORAL
  Filled 2023-05-25 (×3): qty 2

## 2023-05-25 MED ORDER — ALBUTEROL SULFATE (2.5 MG/3ML) 0.083% IN NEBU: 2.5000 mg | INHALATION_SOLUTION | Freq: Four times a day (QID) | RESPIRATORY_TRACT | Status: DC | PRN

## 2023-05-25 MED ORDER — INSULIN ASPART 100 UNIT/ML IJ SOLN: 0.0000 [IU] | Freq: Three times a day (TID) | INTRAMUSCULAR | Status: DC

## 2023-05-25 MED ORDER — PANTOPRAZOLE SODIUM 40 MG IV SOLR
40.0000 mg | Freq: Two times a day (BID) | INTRAVENOUS | Status: DC
  Administered 2023-05-25 – 2023-05-28 (×6): 40 mg via INTRAVENOUS
  Filled 2023-05-25 (×6): qty 10

## 2023-05-25 NOTE — H&P (Addendum)
 History and Physical    Jennifer Carrillo:[id number] DOB: 04-28-1933 DOA: 05/25/2023  PCP: Jacques Mattock, PA-C (Confirm with patient/family/NH records and if not entered, this has to be entered at [facility] point of entry) Patient coming from: Home  I have personally briefly reviewed patient's old medical records in [facility]  Chief Complaint:   HPI: Jennifer Carrillo is a 89 y.o. female with medical history significant of ESRD on HD TTS, HTN, PVD s/p multiple stenting and femoral artery renal artery left carotid artery on Plavix , multivessel CAD with known RCA occlusion with collaterals, IDDM, chronic iron  deficiency anemia no longer on iron  supplement, COPD with chronic hypoxic respiratory failure on 2 L nightly, dementia, sent from dialysis center for evaluation of shortness of breath.  Patient received 2 hours of regular dialysis today and started to feel increasing shortness of breath despite increasing oxygen at HD center.  She also complained about pressure-like discomfort in the chest, denied any lightheadedness palpitations.  EMS arrived and found vital signs blood pressure 150/60 heart rate 72 and patient was placed on 4 L.  Hemoglobin 5.7 on Thursday.  Family reported on patient has been complaining about epigastric discomfort associated with new eating for about 1 week and has been passing intermittent dark-colored stool.  No history of GI bleed and no history of EGD or colonoscopy before. ED Course: Afebrile, pulse is 70, blood pressure 130/60 with saturation 100% on 4 L.  Chest x-ray showed no acute infiltrates.  WBC 8.2, hemoglobin 6.7, K3.3, BUN 28 creatinine 3.2.  Guaiac test positive.  Patient was given PPI x 1 and PRBC transfusion.  Review of Systems: As per HPI otherwise 14 point review of systems negative.    Past Medical History:  Diagnosis Date   Arthritis    Asthma    Calf pain    Cancer (HCC) 2016   left breast   COPD (chronic obstructive pulmonary  disease) (HCC)    Diabetes (HCC)    Discoid lupus    Gastro-esophageal reflux    Glaucoma    Hyperlipemia    Hypertension    Iron  deficiency anemia    Joint pain    Leg swelling    Osteoarthritis    PVD (peripheral vascular disease) (HCC)    Sinus problem    Sleep apnea    No CPAP/ Can't tolerate   Stroke (HCC) 1987    Past Surgical History:  Procedure Laterality Date   BREAST SURGERY Left    left lumpectomy   CAROTID ANGIOGRAPHY Left 06/25/2018   Procedure: CAROTID ANGIOGRAPHY, possible intervention;  Surgeon: Jackquelyn Mass, MD;  Location: [facility] INVASIVE CV LAB;  Service: Cardiovascular;  Laterality: Left;   CAROTID ENDARTERECTOMY Right    CAROTID PTA/STENT INTERVENTION Left 06/25/2018   Procedure: CAROTID PTA/STENT INTERVENTION;  Surgeon: Jackquelyn Mass, MD;  Location: [facility] INVASIVE CV LAB;  Service: Cardiovascular;  Laterality: Left;   CATARACT EXTRACTION Right 2013   CORONARY STENT PLACEMENT     L. L. E.   DIALYSIS/PERMA CATHETER INSERTION N/A 12/29/2020   Procedure: DIALYSIS/PERMA CATHETER INSERTION;  Surgeon: Celso College, MD;  Location: [facility] INVASIVE CV LAB;  Service: Cardiovascular;  Laterality: N/A;   DIALYSIS/PERMA CATHETER INSERTION N/A 12/14/2021   Procedure: DIALYSIS/PERMA CATHETER INSERTION;  Surgeon: Jackquelyn Mass, MD;  Location: [facility] INVASIVE CV LAB;  Service: Cardiovascular;  Laterality: N/A;   EYE SURGERY Bilateral    cataract   LEFT HEART CATH AND CORONARY ANGIOGRAPHY Left 05/06/2018  Procedure: LEFT HEART CATH AND CORONARY ANGIOGRAPHY;  Surgeon: Michelle Aid, MD;  Location: [facility] INVASIVE CV LAB;  Service: Cardiovascular;  Laterality: Left;   LOWER EXTREMITY ANGIOGRAPHY Right 01/22/2018   Procedure: LOWER EXTREMITY ANGIOGRAPHY;  Surgeon: Jackquelyn Mass, MD;  Location: [facility] INVASIVE CV LAB;  Service: Cardiovascular;  Laterality: Right;   RENAL ANGIOGRAPHY Left 02/26/2018   Procedure: RENAL ANGIOGRAPHY;  Surgeon: Jackquelyn Mass, MD;   Location: [facility] INVASIVE CV LAB;  Service: Cardiovascular;  Laterality: Left;   STENT PLACEMENT VASCULAR ([facility] HX) Left    stent placement on LLE   TUBAL LIGATION       reports that she quit smoking about 38 years ago. Her smoking use included cigarettes. She has never used smokeless tobacco. She reports that she does not drink alcohol and does not use drugs.  Allergies  Allergen Reactions   Benazepril Nausea Only    Family History  Problem Relation Age of Onset   Heart disease Mother    Diabetes Sister    Leukemia Daughter    Lung cancer Brother    Diabetes Other    Hypertension Other    Bladder Cancer Neg Hx    Kidney disease Neg Hx    Prostate cancer Neg Hx    Breast cancer Neg Hx      Prior to Admission medications   Medication Sig Start Date End Date Taking? Authorizing Provider  ACCU-CHEK GUIDE test strip TEST BLOOD SUGAR THREE TIMES DAILY  AS  DIRECTED 04/27/20   Khan, Fozia M, MD  Accu-Chek Softclix Lancets lancets USE AS INSTRUCTED 3 TIMES A DAY 09/22/19   Khan, Fozia M, MD  acetaminophen  (TYLENOL ) 500 MG tablet Take 1 tablet (500 mg total) by mouth every 6 (six) hours as needed for moderate pain or headache. 12/31/20   Patel, Sona, MD  albuterol  (VENTOLIN  HFA) [phone] Base) MCG/ACT inhaler Inhale 2 puffs into the lungs every 6 (six) hours as needed for wheezing or shortness of breath. 02/01/23   McDonough, Lillie Reining, PA-C  Alcohol Swabs (B-D SINGLE USE SWABS REGULAR) PADS Use as directed for 3 times daily DX E11.65 06/22/19   Scarboro, Adam J, NP  amLODipine  (NORVASC ) 10 MG tablet TAKE 1 TABLET EVERY DAY AT [facility] 06/14/21   Khan, Fozia M, MD  atorvastatin  (LIPITOR) 40 MG tablet Take 40 mg by mouth daily.    [provider]  Blood Glucose Monitoring Suppl (ACCU-CHEK GUIDE ME) w/Device KIT Use as instructed. DX e11.65 06/18/19   Sheria Dills, NP  Budeson-Glycopyrrol-Formoterol  (BREZTRI  AEROSPHERE) 160-9-4.8 MCG/ACT AERO Inhale 2 puffs into the lungs 2 (two) times  daily. 02/01/23   McDonough, Lauren K, PA-C  clopidogrel  (PLAVIX ) 75 MG tablet TAKE 1 TABLET DAILY AT [facility] 05/26/21   McDonough, Lauren K, PA-C  docusate sodium  (COLACE) 100 MG capsule Take 200 mg by mouth at bedtime as needed for mild constipation.    [provider]  donepezil  (ARICEPT ) 10 MG tablet TAKE 1 TABLET AT BEDTIME FOR MEMORY (NEED MD APPOINTMENT) 02/01/23   McDonough, Lillie Reining, PA-C  doxycycline  (ADOXA) 100 MG tablet Take 100 mg by mouth daily. 11/06/22   [provider]  ergocalciferol  (VITAMIN D2) 1.25 MG (50000 UT) capsule Take 1 capsule (50,000 Units total) by mouth once a week. 06/26/22   McDonough, Lillie Reining, PA-C  hydrALAZINE  (APRESOLINE ) 50 MG tablet Take 100 mg by mouth in the morning and at bedtime. Take one tablet in AM and two tablets in PM 12/12/20  12/12/21  [provider]  insulin  NPH-regular Human (70-30) 100 UNIT/ML injection Inject 12 Units into the skin 2 (two) times daily. Inject 20 units subcutaneously in the morning and 20 units in the evening. Patient taking differently: Inject into the skin daily. Use as directed 1-2 units as needed if glucose is fasting glucose 150 12/31/20   Patel, Sona, MD  isosorbide  mononitrate (IMDUR ) 60 MG 24 hr tablet Take 60 mg by mouth daily. 12/13/20   [provider]    Physical Exam: Vitals:   05/25/23 1021 05/25/23 1031 05/25/23 1129 05/25/23 1151  BP: 135/61  (!) 134/57 (!) 137/48  Pulse: 74  70 68  Resp: (!) 23  14 20   Temp: 97.9 F (36.6 C)  97.9 F (36.6 C) 98.3 F (36.8 C)  TempSrc:   Oral Oral  SpO2: 100%  100% 100%  Weight:  82.1 kg    Height:  5\' 7"  (1.702 m)      Constitutional: NAD, calm, comfortable Vitals:   05/25/23 1021 05/25/23 1031 05/25/23 1129 05/25/23 1151  BP: 135/61  (!) 134/57 (!) 137/48  Pulse: 74  70 68  Resp: (!) 23  14 20   Temp: 97.9 F (36.6 C)  97.9 F (36.6 C) 98.3 F (36.8 C)  TempSrc:   Oral Oral  SpO2: 100%  100% 100%  Weight:  82.1 kg    Height:   5\' 7"  (1.702 m)     Eyes: PERRL, lids and conjunctivae normal ENMT: Mucous membranes are moist. Posterior pharynx clear of any exudate or lesions.Normal dentition.  Neck: normal, supple, no masses, no thyromegaly Respiratory: clear to auscultation bilaterally, no wheezing, no crackles. Normal respiratory effort. No accessory muscle use.  Cardiovascular: Regular rate and rhythm, no murmurs / rubs / gallops. No extremity edema. 2+ pedal pulses. No carotid bruits.  Abdomen: no tenderness, no masses palpated. No hepatosplenomegaly. Bowel sounds positive.  Musculoskeletal: no clubbing / cyanosis. No joint deformity upper and lower extremities. Good ROM, no contractures. Normal muscle tone.  Skin: no rashes, lesions, ulcers. No induration Neurologic: CN 2-12 grossly intact. Sensation intact, DTR normal. Strength 5/5 in all 4.  Psychiatric: Normal judgment and insight. Alert and oriented x 3. Normal mood.     Labs on Admission: I have personally reviewed following labs and imaging studies  CBC: Recent Labs  Lab 05/25/23 1024  WBC 8.2  NEUTROABS 6.0  HGB 6.7*  HCT 22.7*  MCV 89.4  PLT 270   Basic Metabolic Panel: Recent Labs  Lab 05/25/23 1024  NA 136  K 3.3*  CL 99  CO2 24  GLUCOSE 134*  BUN 28*  CREATININE 3.22*  CALCIUM  9.0   GFR: Estimated Creatinine Clearance: 13.3 mL/min (A) (by C-G formula based on SCr of 3.22 mg/dL (H)). Liver Function Tests: Recent Labs  Lab 05/25/23 1024  AST 36  ALT 37  ALKPHOS 75  BILITOT 0.9  PROT 7.7  ALBUMIN 3.7   No results for input(s): "LIPASE", "AMYLASE" in the last 168 hours. No results for input(s): "AMMONIA" in the last 168 hours. Coagulation Profile: Recent Labs  Lab 05/25/23 1024  INR 1.0   Cardiac Enzymes: No results for input(s): "CKTOTAL", "CKMB", "CKMBINDEX", "TROPONINI" in the last 168 hours. BNP (last 3 results) No results for input(s): "PROBNP" in the last 8760 hours. HbA1C: No results for input(s): "HGBA1C" in  the last 72 hours. CBG: No results for input(s): "GLUCAP" in the last 168 hours. Lipid Profile: No results for input(s): "CHOL", "HDL", "  LDLCALC", "TRIG", "CHOLHDL", "LDLDIRECT" in the last 72 hours. Thyroid  Function Tests: No results for input(s): "TSH", "T4TOTAL", "FREET4", "T3FREE", "THYROIDAB" in the last 72 hours. Anemia Panel: Recent Labs    05/25/23 1024  RETICCTPCT 6.6*   Urine analysis:    Component Value Date/Time   COLORURINE STRAW (A) 10/28/2021 1532   APPEARANCEUR CLEAR (A) 10/28/2021 1532   APPEARANCEUR Clear 03/24/2020 0954   LABSPEC 1.004 (L) 10/28/2021 1532   PHURINE 9.0 (H) 10/28/2021 1532   GLUCOSEU NEGATIVE 10/28/2021 1532   HGBUR NEGATIVE 10/28/2021 1532   BILIRUBINUR NEGATIVE 10/28/2021 1532   BILIRUBINUR Negative 03/24/2020 0954   KETONESUR NEGATIVE 10/28/2021 1532   PROTEINUR 100 (A) 10/28/2021 1532   UROBILINOGEN 0.2 12/17/2017 0931   NITRITE NEGATIVE 10/28/2021 1532   LEUKOCYTESUR NEGATIVE 10/28/2021 1532    Radiological Exams on Admission: DG Chest Portable 1 View Result Date: 05/25/2023 CLINICAL DATA:  SOB EXAM: PORTABLE CHEST - 1 VIEW COMPARISON:  01/04/2022 and previous FINDINGS: Stable tunneled right IJ HD catheter to the cavoatrial junction. Mild patchy opacities peripherally at the right lung base, and left retrocardiac. Heart size and mediastinal contours are within normal limits. Aortic Atherosclerosis (ICD10-170.0). No effusion. Visualized bones unremarkable. IMPRESSION: Mild patchy bibasilar opacities. Electronically Signed   By: Nicoletta Barrier M.D.   On: 05/25/2023 10:40    EKG: Independently reviewed.  Sinus rhythm, no acute ST changes.  Assessment/Plan Principal Problem:   Lower GI bleed Active Problems:   GI bleed  (please populate well all problems here in Problem List. (For example, if patient is on BP meds at home and you resume or decide to hold them, it is a problem that needs to be her. Same for CAD, COPD, HLD and so on)  Acute  on chronic iron  deficiency anemia Hematochezia - Clinically suspect new onset/worsening of peptic ulcer given the new onset of upper GI symptoms after eating meals for 1+ week.  Long discussion with patient and her family members including husband and son and brother at bedside, given patient's comorbidities some of them as severe, including multivessel CAD CVA PVD COPD, GI procedure including EGD and/or colonoscopy risks outweigh benefit at this point.  Given that the patient hemodynamic stable, no tachycardia or hypotension and hemoglobin 6.7 compared to a baseline 7-8, likely the bleeding rate is slow.  Family agreed with conservative management n.p.o. PPI.  Repeat hemoglobin this evening and transfuse for hemodynamic instability. - Vital signs stable, will hold off CTA for now. - Hold off GI consult today if H&H not stabilized, consider consult GI tomorrow. - Family agreed with holding off Plavix  for now  HTN - Hold off home BP meds and start as needed hydralazine   IDDM -SSI  ESRD on HD - Euvolemic and no overt electrolyte or acid-base abnormality, can probably resume routine dialysis next Tuesday.  PVD Multivessel CAD History of stroke - Hold off Plavix   COPD - No acute concern  Dementia - Mentation at baseline - Discussed with patient and her family regarding CODE STATUS, all confirmed the patient is full code.  DVT prophylaxis: SCD Code Status: Full code Family Communication: Husband son and brother at bedside Disposition Plan: Patient sick with symptomatic anemia from worsening of chronic iron  deficient anemia and GI bleed, requiring inpatient IV PPI treatment and close monitoring of hemoglobin and PRBC transfusion, expect more than 2 midnight hospital stay Consults called: None Admission status: PCU admit   [place] MD [facility] Pager [phone]  05/25/2023, 12:20 PM

## 2023-05-25 NOTE — ED Triage Notes (Signed)
 Coming from dialysis, patient recieved only 2 hours. Patient received Tues, TH, Sat. SOB while recieving treatment. Patients last dialysis went good without complication according to patient. 156/60, 72, 4L [place] currently for EMS VS. Patient normally wears 3L at baseline.  Patient had a hemoglobin of 5.7 from labs that were drawn on Thursday. Nurse reports that patient had dark colored stool.  EDP at bedside. Patient is breathless and labored when speaking sentences. Short sentences currently.

## 2023-05-25 NOTE — ED Notes (Signed)
Blood consent signed in chart.

## 2023-05-25 NOTE — ED Notes (Signed)
 Elisabeth Guild notified of H&H levels. Pt denies chest pain or SOB. Vitals stable. Orders received.

## 2023-05-25 NOTE — ED Provider Notes (Signed)
 [facility] Provider Note    Event Date/Time   First MD Initiated Contact with Patient 05/25/23 1021     (approximate)   History   Chief Complaint Shortness of Breath   HPI  Jennifer Carrillo is a 89 y.o. female with past medical history of hypertension, diabetes, CAD, ESRD on HD (TTS), iron  deficiency anemia, and discoid lupus who presents to the ED complaining of shortness of breath.  Patient reports that she has been feeling increasingly short of breath over the past 24 hours, was only able to make it through about 2 hours of her dialysis treatment this morning before EMS was contacted for transport to the ED.  She does state that she has had dark black stools for about the past 2 weeks, staff at dialysis center reported that patient had blood work drawn 2 days ago that came back with a hemoglobin of 5.7 this morning.  Patient denies any abdominal pain, nausea, or vomiting.  She does take Plavix , denies any history of GI bleeding.     Physical Exam   Triage Vital Signs: ED Triage Vitals [05/25/23 1020]  Encounter Vitals Group     BP      Systolic BP Percentile      Diastolic BP Percentile      Pulse      Resp      Temp      Temp src      SpO2      Weight      Height      Head Circumference      Peak Flow      Pain Score 0     Pain Loc      Pain Education      Exclude from Growth Chart     Most recent vital signs: Vitals:   05/25/23 1021 05/25/23 1129  BP: 135/61 (!) 134/57  Pulse: 74 70  Resp: (!) 23 14  Temp: 97.9 F (36.6 C) 97.9 F (36.6 C)  SpO2: 100% 100%    Constitutional: Alert and oriented. Eyes: Conjunctivae are normal. Head: Atraumatic. Nose: No congestion/rhinnorhea. Mouth/Throat: Mucous membranes are moist.  Cardiovascular: Normal rate, regular rhythm. Grossly normal heart sounds.  2+ radial pulses bilaterally. Respiratory: Mildly tachypneic with normal respiratory effort.  No retractions. Lungs CTAB.  Right chest  wall TDC intact. Gastrointestinal: Soft and nontender. No distention. Musculoskeletal: No lower extremity tenderness nor edema.  Neurologic:  Normal speech and language. No gross focal neurologic deficits are appreciated.    ED Results / Procedures / Treatments   Labs (all labs ordered are listed, but only abnormal results are displayed) Labs Reviewed  CBC WITH DIFFERENTIAL/PLATELET - Abnormal; Notable for the following components:      Result Value   RBC 2.54 (*)    Hemoglobin 6.7 (*)    HCT 22.7 (*)    MCHC 29.5 (*)    RDW 19.9 (*)    All other components within normal limits  COMPREHENSIVE METABOLIC PANEL WITH GFR - Abnormal; Notable for the following components:   Potassium 3.3 (*)    Glucose, Bld 134 (*)    BUN 28 (*)    Creatinine, Ser 3.22 (*)    GFR, Estimated 13 (*)    All other components within normal limits  TROPONIN I (HIGH SENSITIVITY) - Abnormal; Notable for the following components:   Troponin I (High Sensitivity) 32 (*)    All other components within normal limits  PROTIME-INR  APTT  TYPE AND SCREEN  PREPARE RBC (CROSSMATCH)     EKG  ED ECG REPORT I, Twilla Galea, the attending physician, personally viewed and interpreted this ECG.   Date: 05/25/2023  EKG Time: 10:23  Rate: 75  Rhythm: normal sinus rhythm  Axis: Normal  Intervals:right bundle branch block and left posterior fascicular block  ST&T Change: None  RADIOLOGY Chest x-ray reviewed and interpreted by me with no infiltrate, edema, or effusion.  PROCEDURES:  Critical Care performed: Yes, see critical care procedure note(s)  .Critical Care  Performed by: Twilla Galea, MD Authorized by: Twilla Galea, MD   Critical care provider statement:    Critical care time (minutes):  30   Critical care time was exclusive of:  Separately billable procedures and treating other patients and teaching time   Critical care was necessary to treat or prevent imminent or life-threatening  deterioration of the following conditions: Anemia, GI bleed.   Critical care was time spent personally by me on the following activities:  Development of treatment plan with patient or surrogate, discussions with consultants, evaluation of patient's response to treatment, examination of patient, ordering and review of laboratory studies, ordering and review of radiographic studies, ordering and performing treatments and interventions, pulse oximetry, re-evaluation of patient's condition and review of old charts   I assumed direction of critical care for this patient from another provider in my specialty: no     Care discussed with: admitting provider      MEDICATIONS ORDERED IN ED: Medications  pantoprazole  (PROTONIX ) injection 40 mg (has no administration in time range)  pantoprazole  (PROTONIX ) injection 40 mg (40 mg Intravenous Given 05/25/23 1054)     IMPRESSION / MDM / ASSESSMENT AND PLAN / ED COURSE  I reviewed the triage vital signs and the nursing notes.                              89 y.o. female with past medical history of hypertension, diabetes, CAD, ESRD on HD (TTS), iron  deficiency anemia, and discoid lupus who presents to the ED complaining of 24 hours of worsening difficulty breathing, recently discovered to have low hemoglobin.  Patient's presentation is most consistent with acute presentation with potential threat to life or bodily function.  Differential diagnosis includes, but is not limited to, anemia, upper GI bleed, lower GI bleed, ACS, PE, pneumonia, pneumothorax, pulmonary edema, pleural effusion, COPD, electrolyte abnormality.  Patient ill-appearing but in no acute distress, vital signs remarkable for mild tachypnea but she is not in any respiratory distress.  She reportedly wears 3 L nasal cannula of supplemental oxygen at all times, current saturations are 100% on this.  Lungs are clear to auscultation bilaterally and she does not appear fluid overloaded, reported  anemia seems to be the likely source of her shortness of breath.  EKG shows no evidence of arrhythmia or ischemia, labs and chest x-ray are pending at this time.  Labs confirm significant anemia with hemoglobin of 6.7, will transfuse 1 unit PRBCs.  Otherwise, no significant leukocytosis or electrolyte abnormality, renal function consistent with known ESRD.  LFTs and coags are unremarkable, chest x-ray with bibasilar infiltrates but no symptoms to suggest pneumonia at this time, suspect some pulmonary edema given patient was unable to complete her dialysis treatment.  Patient given dose of IV Protonix  and case discussed with hospitalist for admission.      FINAL CLINICAL IMPRESSION(S) / ED DIAGNOSES   Final  diagnoses:  Symptomatic anemia  Gastrointestinal hemorrhage, unspecified gastrointestinal hemorrhage type     Rx / DC Orders   ED Discharge Orders     None        Note:  This document was prepared using Dragon voice recognition software and may include unintentional dictation errors.   Twilla Galea, MD 05/25/23 1147

## 2023-05-25 NOTE — ED Notes (Signed)
 Called Lab to add on Hemoglobin A1c.

## 2023-05-26 DIAGNOSIS — K922 Gastrointestinal hemorrhage, unspecified: Secondary | ICD-10-CM

## 2023-05-26 DIAGNOSIS — D649 Anemia, unspecified: Secondary | ICD-10-CM | POA: Diagnosis not present

## 2023-05-26 DIAGNOSIS — E876 Hypokalemia: Secondary | ICD-10-CM | POA: Insufficient documentation

## 2023-05-26 DIAGNOSIS — K921 Melena: Secondary | ICD-10-CM | POA: Diagnosis not present

## 2023-05-26 DIAGNOSIS — N186 End stage renal disease: Secondary | ICD-10-CM | POA: Diagnosis not present

## 2023-05-26 DIAGNOSIS — E1122 Type 2 diabetes mellitus with diabetic chronic kidney disease: Secondary | ICD-10-CM

## 2023-05-26 DIAGNOSIS — D62 Acute posthemorrhagic anemia: Secondary | ICD-10-CM | POA: Diagnosis not present

## 2023-05-26 DIAGNOSIS — Z992 Dependence on renal dialysis: Secondary | ICD-10-CM

## 2023-05-26 LAB — TYPE AND SCREEN
ABO/RH(D): O POS
Antibody Screen: NEGATIVE
Unit division: 0
Unit division: 0

## 2023-05-26 LAB — BPAM RBC
Blood Product Expiration Date: 202506172359
Blood Product Expiration Date: 202506172359
ISSUE DATE / TIME: 202505171132
ISSUE DATE / TIME: 202505172035
Unit Type and Rh: 5100
Unit Type and Rh: 5100

## 2023-05-26 LAB — CBC
HCT: 26.7 % — ABNORMAL LOW (ref 36.0–46.0)
Hemoglobin: 8.5 g/dL — ABNORMAL LOW (ref 12.0–15.0)
MCH: 28.4 pg (ref 26.0–34.0)
MCHC: 31.8 g/dL (ref 30.0–36.0)
MCV: 89.3 fL (ref 80.0–100.0)
Platelets: 215 10*3/uL (ref 150–400)
RBC: 2.99 MIL/uL — ABNORMAL LOW (ref 3.87–5.11)
RDW: 18 % — ABNORMAL HIGH (ref 11.5–15.5)
WBC: 8 10*3/uL (ref 4.0–10.5)
nRBC: 0 % (ref 0.0–0.2)

## 2023-05-26 LAB — CBG MONITORING, ED
Glucose-Capillary: 93 mg/dL (ref 70–99)
Glucose-Capillary: 90 mg/dL (ref 70–99)

## 2023-05-26 LAB — HEMOGLOBIN
Hemoglobin: 9.3 g/dL — ABNORMAL LOW (ref 12.0–15.0)
Hemoglobin: 8.6 g/dL — ABNORMAL LOW (ref 12.0–15.0)

## 2023-05-26 LAB — VITAMIN B12: Vitamin B-12: 686 pg/mL (ref 180–914)

## 2023-05-26 LAB — GLUCOSE, CAPILLARY
Glucose-Capillary: 143 mg/dL — ABNORMAL HIGH (ref 70–99)
Glucose-Capillary: 121 mg/dL — ABNORMAL HIGH (ref 70–99)

## 2023-05-26 NOTE — Hospital Course (Addendum)
 Jennifer Carrillo is a 89 y.o. female with medical history significant of ESRD on HD TTS, HTN, PVD s/p multiple stenting and femoral artery renal artery left carotid artery on Plavix , multivessel CAD with known RCA occlusion with collaterals, IDDM, chronic iron  deficiency anemia no longer on iron  supplement, COPD with chronic hypoxic respiratory failure on 2 L nightly, dementia, sent from dialysis center for evaluation of shortness of breath.  He also had intermittent black stool for a week. Upon arriving the hospital, hemoglobin was 6.7, received 2 units of transfusion. Patient hemoglobin has been stabilized, EGD was performed 5/20, showed some AVM, no longer has any bleeding.  At this point, patient is medically stable for discharge.

## 2023-05-26 NOTE — Progress Notes (Signed)
  Progress Note   Patient: Jennifer Carrillo ZOX:[id number] DOB: [birth year] DOA: 05/25/2023     1 DOS: the patient was seen and examined on 05/26/2023   Brief hospital course: Jennifer Carrillo is a 89 y.o. female with medical history significant of ESRD on HD TTS, HTN, PVD s/p multiple stenting and femoral artery renal artery left carotid artery on Plavix , multivessel CAD with known RCA occlusion with collaterals, IDDM, chronic iron  deficiency anemia no longer on iron  supplement, COPD with chronic hypoxic respiratory failure on 2 L nightly, dementia, sent from dialysis center for evaluation of shortness of breath.  He also had intermittent black stool for a week. Upon arriving the hospital, hemoglobin was 6.7, received 2 units of transfusion.   Principal Problem:   Lower GI bleed Active Problems:   Benign essential HTN   End stage renal disease on dialysis due to type 2 diabetes mellitus (HCC)   GI bleed   Acute blood loss anemia   Hypokalemia   Acute upper GI bleed   Assessment and Plan: Acute blood loss anemia secondary to GI bleed. Upper GI bleed probably secondary to peptic ulcer disease. Patient has been having black stools, hemoglobin dropped down to 6.7.  Hemoglobin 8.5 after 2 units of blood transfusion.  Continue Protonix  twice a day. Continue monitor hemoglobin, transfuse as needed. Consult GI for possible EGD, will place patient on liquid diet for now.  End-stage renal disease on dialysis. Hypokalemia. Consult nephrology for dialysis on Tuesday.  No urgent need for dialysis.  Essential hypertension. Medication on hold due to acute bleeding.  Type 2 diabetes Continue sliding scale insulin .  Overweight with BMI 28.36. Diet and excise.  Dementia. Continue to follow.       Subjective:  Patient still complaining of some upper abdominal discomfort, no nausea vomiting.  She had loose stool and black yesterday.  Physical Exam: Vitals:   05/26/23 0600 05/26/23 0630  05/26/23 0747 05/26/23 0930  BP: (!) 140/60 (!) 143/52 (!) 153/64 (!) 148/63  Pulse: 65 64 68 66  Resp: (!) 21 (!) 25 19 (!) 33  Temp:   97.6 F (36.4 C)   TempSrc:   Oral   SpO2: 100% 100% 100% 100%  Weight:      Height:       General exam: Appears calm and comfortable  Respiratory system: Clear to auscultation. Respiratory effort normal. Cardiovascular system: S1 & S2 heard, RRR. No JVD, murmurs, rubs, gallops or clicks. No pedal edema. Gastrointestinal system: Abdomen is nondistended, soft and nontender. No organomegaly or masses felt. Normal bowel sounds heard. Central nervous system: Alert and oriented x2. No focal neurological deficits. Extremities: Symmetric 5 x 5 power. Skin: No rashes, lesions or ulcers Psychiatry: Mood & affect appropriate.    Data Reviewed:  Lab results reviewed.  Family Communication: Called, not able to reach family.  Disposition: Status is: Inpatient Remains inpatient appropriate because: Severity of disease, IV treatment.     Time spent: 50 minutes  Author: Donaciano Frizzle, MD 05/26/2023 12:39 PM  For on call review [email or web address].

## 2023-05-26 NOTE — Consult Note (Addendum)
 Jennifer Carrillo , MD [address], [address], [place], [place], [address] [address], [place], [place], [address] Phone: [phone]  Fax: [phone]  Consultation  Referring Provider:   Dr Jeane Miguel Primary Care Physician:  Jacques Mattock, PA-C Primary Gastroenterologist:  none          Reason for Consultation:     GI bleed  Date of Admission:  05/25/2023 Date of Consultation:  05/26/2023         HPI:   Jennifer Carrillo is a 89 y.o. female who has previously been evaluated at [facility] back in 2021 and underwent a colonoscopy found to have 2 subcentimeter sessile polyps at the ileocecal valve and transverse colon that were resected.  Colonic diverticulosis was seen throughout the colon.  Nonbleeding internal hemorrhoids were noted.  She also underwent an upper endoscopy at the same time found to have a nonobstructing Schatzki's ring otherwise was normal.  At that point of time it appears that the patient had iron  deficiency anemia.  I cannot see any other notes subsequently.  She presented to the hospital with shortness of breath.  Has been having intermittent black stools for a week.  On admission hemoglobin was 6.7 g.  She is on dialysis for end-stage renal disease on Plavix  per multivessel CAD.  She is diabetic and and suffers from chronic iron  deficiency anemia.  She also is on long-term oxygen 2 L nightly and has a degree of dementia.   I was consulted to see her for the melena.Baseline hemoglobin 11 months back was 8.1 g with an MCV of 81 on admission hemoglobin of 6.7 g with MCV of 89 and this morning after transfusion is 8.5 g.  Ferritin is normal at 130.  Creatinine 3.22 BUN of 28.  I have been consulted for an upper endoscopy.   She feels she has been having black-colored stools for a few days denies any significant abdominal discomfort denies any hematemesis denies any NSAID use.  She has been feeling well otherwise.  Past Medical History:  Diagnosis Date   Arthritis     Asthma    Calf pain    Cancer (HCC) 2016   left breast   COPD (chronic obstructive pulmonary disease) (HCC)    Diabetes (HCC)    Discoid lupus    Gastro-esophageal reflux    Glaucoma    Hyperlipemia    Hypertension    Iron  deficiency anemia    Joint pain    Leg swelling    Osteoarthritis    PVD (peripheral vascular disease) (HCC)    Sinus problem    Sleep apnea    No CPAP/ Can't tolerate   Stroke (HCC) 1987    Past Surgical History:  Procedure Laterality Date   BREAST SURGERY Left    left lumpectomy   CAROTID ANGIOGRAPHY Left 06/25/2018   Procedure: CAROTID ANGIOGRAPHY, possible intervention;  Surgeon: Jackquelyn Mass, MD;  Location: [facility];  Service: Cardiovascular;  Laterality: Left;   CAROTID ENDARTERECTOMY Right    CAROTID PTA/STENT INTERVENTION Left 06/25/2018   Procedure: CAROTID PTA/STENT INTERVENTION;  Surgeon: Jackquelyn Mass, MD;  Location: [facility];  Service: Cardiovascular;  Laterality: Left;   CATARACT EXTRACTION Right 2013   CORONARY STENT PLACEMENT     L. L. E.   DIALYSIS/PERMA CATHETER INSERTION N/A 12/29/2020   Procedure: DIALYSIS/PERMA CATHETER INSERTION;  Surgeon: Celso College, MD;  Location: [facility];  Service: Cardiovascular;  Laterality: N/A;   DIALYSIS/PERMA CATHETER INSERTION N/A 12/14/2021   Procedure: DIALYSIS/PERMA CATHETER INSERTION;  Surgeon: Jackquelyn Mass, MD;  Location: [facility];  Service: Cardiovascular;  Laterality: N/A;   EYE SURGERY Bilateral    cataract   LEFT HEART CATH AND CORONARY ANGIOGRAPHY Left 05/06/2018   Procedure: LEFT HEART CATH AND CORONARY ANGIOGRAPHY;  Surgeon: Michelle Aid, MD;  Location: [facility];  Service: Cardiovascular;  Laterality: Left;   LOWER EXTREMITY ANGIOGRAPHY Right 01/22/2018   Procedure: LOWER EXTREMITY ANGIOGRAPHY;  Surgeon: Jackquelyn Mass, MD;  Location: [facility];  Service: Cardiovascular;  Laterality: Right;   RENAL  ANGIOGRAPHY Left 02/26/2018   Procedure: RENAL ANGIOGRAPHY;  Surgeon: Jackquelyn Mass, MD;  Location: [facility];  Service: Cardiovascular;  Laterality: Left;   STENT PLACEMENT VASCULAR ([facility] HX) Left    stent placement on LLE   TUBAL LIGATION      Prior to Admission medications   Medication Sig Start Date End Date Taking? Authorizing Provider  acetaminophen  (TYLENOL ) 500 MG tablet Take 1 tablet (500 mg total) by mouth every 6 (six) hours as needed for moderate pain or headache. 12/31/20  Yes Melvinia Stager, MD  albuterol  (VENTOLIN  HFA) 108 (90 Base) MCG/ACT inhaler Inhale 2 puffs into the lungs every 6 (six) hours as needed for wheezing or shortness of breath. 02/01/23  Yes McDonough, Lauren K, PA-C  amLODipine  (NORVASC ) 10 MG tablet TAKE 1 TABLET EVERY DAY AT [facility] 06/14/21  Yes Khan, Fozia M, MD  atorvastatin  (LIPITOR) 40 MG tablet Take 40 mg by mouth daily.   Yes [provider]  clopidogrel  (PLAVIX ) 75 MG tablet TAKE 1 TABLET DAILY AT DINNER 05/26/21  Yes McDonough, Lauren K, PA-C  docusate sodium  (COLACE) 100 MG capsule Take 200 mg by mouth at bedtime as needed for mild constipation.   Yes [provider]  donepezil  (ARICEPT ) 10 MG tablet TAKE 1 TABLET AT BEDTIME FOR MEMORY (NEED MD APPOINTMENT) 02/01/23  Yes McDonough, Lauren K, PA-C  hydrALAZINE  (APRESOLINE ) 100 MG tablet Take 100 mg by mouth 3 (three) times daily.   Yes [provider]  isosorbide  mononitrate (IMDUR ) 60 MG 24 hr tablet Take 60 mg by mouth daily. 12/13/20  Yes [provider]  torsemide (DEMADEX) 100 MG tablet Take 100 mg by mouth daily. At noon   Yes [provider]  ACCU-CHEK GUIDE test strip TEST BLOOD SUGAR THREE TIMES DAILY  AS  DIRECTED 04/27/20   Khan, Fozia M, MD  Accu-Chek Softclix Lancets lancets USE AS INSTRUCTED 3 TIMES A DAY 09/22/19   Khan, Fozia M, MD  Alcohol Swabs (B-D SINGLE USE SWABS REGULAR) PADS Use as directed for 3 times daily DX E11.65 06/22/19    Sheria Dills, NP  Blood Glucose Monitoring Suppl (ACCU-CHEK GUIDE ME) w/Device KIT Use as instructed. DX e11.65 06/18/19   Scarboro, Adam J, NP  Budeson-Glycopyrrol-Formoterol  (BREZTRI  AEROSPHERE) 160-9-4.8 MCG/ACT AERO Inhale 2 puffs into the lungs 2 (two) times daily. Patient not taking: Reported on 05/25/2023 02/01/23   Jacques Mattock, PA-C  doxycycline  (ADOXA) 100 MG tablet Take 100 mg by mouth daily. Patient not taking: Reported on 05/25/2023 11/06/22   [provider]  ergocalciferol  (VITAMIN D2) 1.25 MG (50000 UT) capsule Take 1 capsule (50,000 Units total) by mouth once a week. Patient not taking: Reported on 05/25/2023 06/26/22   Jacques Mattock, PA-C  hydrALAZINE  (APRESOLINE ) 50 MG tablet Take 100 mg by mouth in the  morning and at bedtime. Take one tablet in AM and two tablets in PM 12/12/20 12/12/21  [provider]  insulin  NPH-regular Human (70-30) 100 UNIT/ML injection Inject 12 Units into the skin 2 (two) times daily. Inject 20 units subcutaneously in the morning and 20 units in the evening. Patient not taking: Reported on 05/25/2023 12/31/20   Patel, Sona, MD  traZODone (DESYREL) 50 MG tablet  12/21/22   [provider]    Family History  Problem Relation Age of Onset   Heart disease Mother    Diabetes Sister    Leukemia Daughter    Lung cancer Brother    Diabetes Other    Hypertension Other    Bladder Cancer Neg Hx    Kidney disease Neg Hx    Prostate cancer Neg Hx    Breast cancer Neg Hx      Social History   Tobacco Use   Smoking status: Former    Current packs/day: 0.00    Types: Cigarettes    Quit date: 1987    Years since quitting: 38.4   Smokeless tobacco: Never   Tobacco comments:    quit 31 years  Vaping Use   Vaping status: Never Used  Substance Use Topics   Alcohol use: No   Drug use: No    Allergies as of 05/25/2023 - Review Complete 05/25/2023  Allergen Reaction Noted   Benazepril Nausea Only 05/28/2013     Review of Systems:    All systems reviewed and negative except where noted in HPI.   Physical Exam:  Vital signs in last 24 hours: Temp:  [97.4 F (36.3 C)-98.7 F (37.1 C)] 97.4 F (36.3 C) (05/18 1508) Pulse Rate:  [62-72] 72 (05/18 1508) Resp:  [13-33] 17 (05/18 1508) BP: (105-153)/(46-108) 105/65 (05/18 1508) SpO2:  [98 %-100 %] 100 % (05/18 1508)   General:   Pleasant, cooperative in NAD Head:  Normocephalic and atraumatic. Eyes:   No icterus.   Conjunctiva pink. PERRLA. Ears:  Normal auditory acuity. Neck:  Supple; no masses or thyroidomegaly Lungs: Respirations even and unlabored. Lungs clear to auscultation bilaterally.   No wheezes, crackles, or rhonchi.  Heart:  Regular rate and rhythm;  Without murmur, clicks, rubs or gallops Abdomen:  Soft, nondistended, nontender. Normal bowel sounds. No appreciable masses or hepatomegaly.  No rebound or guarding.  Neurologic:  Alert and oriented x3;  grossly normal neurologically. Psych:  Alert and cooperative. Normal affect.  LAB RESULTS: Recent Labs    05/25/23 1024 05/25/23 1937 05/26/23 0427 05/26/23 1411  WBC 8.2  --  8.0  --   HGB 6.7* 6.9* 8.5* 9.3*  HCT 22.7* 22.1* 26.7*  --   PLT 270  --  215  --    BMET Recent Labs    05/25/23 1024  NA 136  K 3.3*  CL 99  CO2 24  GLUCOSE 134*  BUN 28*  CREATININE 3.22*  CALCIUM  9.0   LFT Recent Labs    05/25/23 1024  PROT 7.7  ALBUMIN 3.7  AST 36  ALT 37  ALKPHOS 75  BILITOT 0.9   PT/INR Recent Labs    05/25/23 1024  LABPROT 13.6  INR 1.0    STUDIES: DG Chest Portable 1 View Result Date: 05/25/2023 CLINICAL DATA:  SOB EXAM: PORTABLE CHEST - 1 VIEW COMPARISON:  01/04/2022 and previous FINDINGS: Stable tunneled right IJ HD catheter to the cavoatrial junction. Mild patchy opacities peripherally at the right lung base, and left retrocardiac. Heart  size and mediastinal contours are within normal limits. Aortic Atherosclerosis (ICD10-170.0). No effusion.  Visualized bones unremarkable. IMPRESSION: Mild patchy bibasilar opacities. Electronically Signed   By: Nicoletta Barrier M.D.   On: 05/25/2023 10:40      Impression / Plan:   Jennifer Carrillo is a 89 y.o. y/o female with history of end-stage renal disease, COPD on oxygen, CAD on Plavix , prior history of iron  deficiency anemia.  EGD and colonoscopy in 2020 showed diverticulosis of the colon internal hemorrhoids otherwise no abnormality to explain.  Chronic anemia presented to the hospital with shortness of breath and a short duration of melena.  Hemoglobin was 6.7 g on admission, baseline hemoglobin of 8.5 g.  I been consulted to see the patient for melena and anemia.  Based on the history very likely the patient has had an upper GI bleed.  The patient is on Plavix  and ideally would need to wait for at least 3 to 5 days before we perform an upper endoscopy.  In the meanwhile suggest to monitor CBC transfuse as needed.  Dr.Wohl will be following the patient from tomorrow.  In the interim continue IV PPI  Thank you for involving me in the care of this patient.      LOS: 1 day   Jennifer Salaam, MD  05/26/2023, 3:57 PM

## 2023-05-26 NOTE — ED Notes (Signed)
 Advised nurse that patient has ready bed

## 2023-05-26 NOTE — Progress Notes (Signed)
 Referring Provider: No ref. provider found Primary Care Physician:  Jacques Mattock, PA-C Primary Nephrologist:  Dr.   Lea Primmer for Consultation: ESRD  HPI: 89 y.o. female with medical history significant of ESRD on HD TTS, HTN, PVD s/p multiple stenting procedures on Plavix , multivessel CAD with known RCA occlusion with collaterals, IDDM, chronic iron  deficiency anemia no longer on iron  supplement, COPD with chronic hypoxic respiratory failure on 2 L nightly, dementia, sent from dialysis center last night for evaluation of shortness of breath.  She also had intermittent black stool for a week. Upon arriving the hospital, hemoglobin was 6.7, received 2 units of transfusion.  Past Medical History:  Diagnosis Date   Arthritis    Asthma    Calf pain    Cancer (HCC) 2016   left breast   COPD (chronic obstructive pulmonary disease) (HCC)    Diabetes (HCC)    Discoid lupus    Gastro-esophageal reflux    Glaucoma    Hyperlipemia    Hypertension    Iron  deficiency anemia    Joint pain    Leg swelling    Osteoarthritis    PVD (peripheral vascular disease) (HCC)    Sinus problem    Sleep apnea    No CPAP/ Can't tolerate   Stroke (HCC) 1987    Past Surgical History:  Procedure Laterality Date   BREAST SURGERY Left    left lumpectomy   CAROTID ANGIOGRAPHY Left 06/25/2018   Procedure: CAROTID ANGIOGRAPHY, possible intervention;  Surgeon: Jackquelyn Mass, MD;  Location: [facility];  Service: Cardiovascular;  Laterality: Left;   CAROTID ENDARTERECTOMY Right    CAROTID PTA/STENT INTERVENTION Left 06/25/2018   Procedure: CAROTID PTA/STENT INTERVENTION;  Surgeon: Jackquelyn Mass, MD;  Location: [facility];  Service: Cardiovascular;  Laterality: Left;   CATARACT EXTRACTION Right 2013   CORONARY STENT PLACEMENT     L. L. E.   DIALYSIS/PERMA CATHETER INSERTION N/A 12/29/2020   Procedure: DIALYSIS/PERMA CATHETER INSERTION;  Surgeon: Celso College, MD;  Location: [facility];  Service: Cardiovascular;  Laterality: N/A;   DIALYSIS/PERMA CATHETER INSERTION N/A 12/14/2021   Procedure: DIALYSIS/PERMA CATHETER INSERTION;  Surgeon: Jackquelyn Mass, MD;  Location: [facility];  Service: Cardiovascular;  Laterality: N/A;   EYE SURGERY Bilateral    cataract   LEFT HEART CATH AND CORONARY ANGIOGRAPHY Left 05/06/2018   Procedure: LEFT HEART CATH AND CORONARY ANGIOGRAPHY;  Surgeon: Michelle Aid, MD;  Location: [facility];  Service: Cardiovascular;  Laterality: Left;   LOWER EXTREMITY ANGIOGRAPHY Right 01/22/2018   Procedure: LOWER EXTREMITY ANGIOGRAPHY;  Surgeon: Jackquelyn Mass, MD;  Location: [facility];  Service: Cardiovascular;  Laterality: Right;   RENAL ANGIOGRAPHY Left 02/26/2018   Procedure: RENAL ANGIOGRAPHY;  Surgeon: Jackquelyn Mass, MD;  Location: [facility];  Service: Cardiovascular;  Laterality: Left;   STENT PLACEMENT VASCULAR ([facility] HX) Left    stent placement on LLE   TUBAL LIGATION      Prior to Admission medications   Medication Sig Start Date End Date Taking? Authorizing Provider  acetaminophen  (TYLENOL ) 500 MG tablet Take 1 tablet (500 mg total) by mouth every 6 (six) hours as needed for moderate pain or headache. 12/31/20  Yes Patel, Sona, MD  albuterol  (VENTOLIN  HFA) 108 (90 Base) MCG/ACT inhaler Inhale 2 puffs into the lungs every 6 (six) hours as needed for wheezing or shortness of breath. 02/01/23  Yes McDonough, Barbra Ley  K, PA-C  amLODipine  (NORVASC ) 10 MG tablet TAKE 1 TABLET EVERY DAY AT BREAKFAST 06/14/21  Yes Khan, Fozia M, MD  atorvastatin  (LIPITOR) 40 MG tablet Take 40 mg by mouth daily.   Yes [provider]  clopidogrel  (PLAVIX ) 75 MG tablet TAKE 1 TABLET DAILY AT DINNER 05/26/21  Yes McDonough, Lauren K, PA-C  docusate sodium  (COLACE) 100 MG capsule Take 200 mg by mouth at bedtime as needed for mild constipation.   Yes [provider]  donepezil  (ARICEPT ) 10 MG  tablet TAKE 1 TABLET AT BEDTIME FOR MEMORY (NEED MD APPOINTMENT) 02/01/23  Yes McDonough, Lauren K, PA-C  hydrALAZINE  (APRESOLINE ) 100 MG tablet Take 100 mg by mouth 3 (three) times daily.   Yes [provider]  isosorbide  mononitrate (IMDUR ) 60 MG 24 hr tablet Take 60 mg by mouth daily. 12/13/20  Yes [provider]  torsemide (DEMADEX) 100 MG tablet Take 100 mg by mouth daily. At noon   Yes [provider]  ACCU-CHEK GUIDE test strip TEST BLOOD SUGAR THREE TIMES DAILY  AS  DIRECTED 04/27/20   Khan, Fozia M, MD  Accu-Chek Softclix Lancets lancets USE AS INSTRUCTED 3 TIMES A DAY 09/22/19   Khan, Fozia M, MD  Alcohol Swabs (B-D SINGLE USE SWABS REGULAR) PADS Use as directed for 3 times daily DX E11.65 06/22/19   Sheria Dills, NP  Blood Glucose Monitoring Suppl (ACCU-CHEK GUIDE ME) w/Device KIT Use as instructed. DX e11.65 06/18/19   Sheria Dills, NP  Budeson-Glycopyrrol-Formoterol  (BREZTRI  AEROSPHERE) 160-9-4.8 MCG/ACT AERO Inhale 2 puffs into the lungs 2 (two) times daily. Patient not taking: Reported on 05/25/2023 02/01/23   Jacques Mattock, PA-C  doxycycline  (ADOXA) 100 MG tablet Take 100 mg by mouth daily. Patient not taking: Reported on 05/25/2023 11/06/22   [provider]  ergocalciferol  (VITAMIN D2) 1.25 MG (50000 UT) capsule Take 1 capsule (50,000 Units total) by mouth once a week. Patient not taking: Reported on 05/25/2023 06/26/22   Jacques Mattock, PA-C  hydrALAZINE  (APRESOLINE ) 50 MG tablet Take 100 mg by mouth in the morning and at bedtime. Take one tablet in AM and two tablets in PM 12/12/20 12/12/21  [provider]  insulin  NPH-regular Human (70-30) 100 UNIT/ML injection Inject 12 Units into the skin 2 (two) times daily. Inject 20 units subcutaneously in the morning and 20 units in the evening. Patient not taking: Reported on 05/25/2023 12/31/20   Patel, Sona, MD  traZODone (DESYREL) 50 MG tablet  12/21/22   [provider]     Current Facility-Administered Medications  Medication Dose Route Frequency Provider Last Rate Last Admin   acetaminophen  (TYLENOL ) tablet 500 mg  500 mg Oral Q6H PRN Antoniette Batty T, MD       albuterol  (PROVENTIL ) (2.5 MG/3ML) 0.083% nebulizer solution 2.5 mg  2.5 mg Inhalation Q6H PRN Antoniette Batty T, MD       atorvastatin  (LIPITOR) tablet 40 mg  40 mg Oral Daily Antoniette Batty T, MD   40 mg at 05/26/23 4098   budesonide-glycopyrrolate-formoterol  (BREZTRI ) 160-9-4.8 MCG/ACT inhaler 2 puff  2 puff Inhalation BID Antoniette Batty T, MD   2 puff at 05/26/23 0944   docusate sodium  (COLACE) capsule 200 mg  200 mg Oral QHS PRN [place], MD       donepezil  (ARICEPT ) tablet 10 mg  10 mg Oral QHS Antoniette Batty T, MD   10 mg at 05/25/23 2306   ferrous sulfate  tablet 325 mg  325 mg  Oral Q breakfast Antoniette Batty T, MD   325 mg at 05/26/23 1191   hydrALAZINE  (APRESOLINE ) injection 5 mg  5 mg Intravenous Q6H PRN [place], MD       insulin  aspart (novoLOG ) injection 0-5 Units  0-5 Units Subcutaneous QHS Antoniette Batty T, MD       insulin  aspart (novoLOG ) injection 0-6 Units  0-6 Units Subcutaneous TID WC Zhang, Ping T, MD       ondansetron  (ZOFRAN ) tablet 4 mg  4 mg Oral Q6H PRN [place], MD       Or   ondansetron  (ZOFRAN ) injection 4 mg  4 mg Intravenous Q6H PRN [place], MD       pantoprazole  (PROTONIX ) injection 40 mg  40 mg Intravenous Q12H Antoniette Batty T, MD   40 mg at 05/26/23 4782   Current Outpatient Medications  Medication Sig Dispense Refill   acetaminophen  (TYLENOL ) 500 MG tablet Take 1 tablet (500 mg total) by mouth every 6 (six) hours as needed for moderate pain or headache. 30 tablet 0   albuterol  (VENTOLIN  HFA) 108 (90 Base) MCG/ACT inhaler Inhale 2 puffs into the lungs every 6 (six) hours as needed for wheezing or shortness of breath. 8 g 2   amLODipine  (NORVASC ) 10 MG tablet TAKE 1 TABLET EVERY DAY AT BREAKFAST 90 tablet 1   atorvastatin  (LIPITOR) 40 MG tablet Take 40 mg by mouth  daily.     clopidogrel  (PLAVIX ) 75 MG tablet TAKE 1 TABLET DAILY AT DINNER 90 tablet 1   docusate sodium  (COLACE) 100 MG capsule Take 200 mg by mouth at bedtime as needed for mild constipation.     donepezil  (ARICEPT ) 10 MG tablet TAKE 1 TABLET AT BEDTIME FOR MEMORY (NEED MD APPOINTMENT) 90 tablet 3   hydrALAZINE  (APRESOLINE ) 100 MG tablet Take 100 mg by mouth 3 (three) times daily.     isosorbide  mononitrate (IMDUR ) 60 MG 24 hr tablet Take 60 mg by mouth daily.     torsemide (DEMADEX) 100 MG tablet Take 100 mg by mouth daily. At noon     ACCU-CHEK GUIDE test strip TEST BLOOD SUGAR THREE TIMES DAILY  AS  DIRECTED 300 strip 1   Accu-Chek Softclix Lancets lancets USE AS INSTRUCTED 3 TIMES A DAY 300 each 3   Alcohol Swabs (B-D SINGLE USE SWABS REGULAR) PADS Use as directed for 3 times daily DX E11.65 100 each 1   Blood Glucose Monitoring Suppl (ACCU-CHEK GUIDE ME) w/Device KIT Use as instructed. DX e11.65 1 kit 0   Budeson-Glycopyrrol-Formoterol  (BREZTRI  AEROSPHERE) 160-9-4.8 MCG/ACT AERO Inhale 2 puffs into the lungs 2 (two) times daily. (Patient not taking: Reported on 05/25/2023) 10.7 g 11   doxycycline  (ADOXA) 100 MG tablet Take 100 mg by mouth daily. (Patient not taking: Reported on 05/25/2023)     ergocalciferol  (VITAMIN D2) 1.25 MG (50000 UT) capsule Take 1 capsule (50,000 Units total) by mouth once a week. (Patient not taking: Reported on 05/25/2023) 12 capsule 3   hydrALAZINE  (APRESOLINE ) 50 MG tablet Take 100 mg by mouth in the morning and at bedtime. Take one tablet in AM and two tablets in PM     insulin  NPH-regular Human (70-30) 100 UNIT/ML injection Inject 12 Units into the skin 2 (two) times daily. Inject 20 units subcutaneously in the morning and 20 units in the evening. (Patient not taking: Reported on 05/25/2023) 10 mL 11   traZODone (DESYREL) 50 MG tablet  (Patient not taking: Reported on 05/25/2023)  Allergies as of 05/25/2023 - Review Complete 05/25/2023  Allergen Reaction Noted    Benazepril Nausea Only 05/28/2013    Family History  Problem Relation Age of Onset   Heart disease Mother    Diabetes Sister    Leukemia Daughter    Lung cancer Brother    Diabetes Other    Hypertension Other    Bladder Cancer Neg Hx    Kidney disease Neg Hx    Prostate cancer Neg Hx    Breast cancer Neg Hx     Social History   Socioeconomic History   Marital status: Married    Spouse name: Not on file   Number of children: Not on file   Years of education: Not on file   Highest education level: Not on file  Occupational History   Not on file  Tobacco Use   Smoking status: Former    Current packs/day: 0.00    Types: Cigarettes    Quit date: 1987    Years since quitting: 38.4   Smokeless tobacco: Never   Tobacco comments:    quit 31 years  Vaping Use   Vaping status: Never Used  Substance and Sexual Activity   Alcohol use: No   Drug use: No   Sexual activity: Not on file  Other Topics Concern   Not on file  Social History Narrative   Walks with a rolling walker; remote hx of smoking; no alcohol; [place]- with husband/son. Daughter lives in [place].    Social Drivers of Corporate investment banker Strain: Low Risk  (06/23/2020)   Overall Financial Resource Strain (CARDIA)    Difficulty of Paying Living Expenses: Not very hard  Food Insecurity: No Food Insecurity (09/26/2022)   Hunger Vital Sign    Worried About Running Out of Food in the Last Year: Never true    Ran Out of Food in the Last Year: Never true  Recent Concern: Food Insecurity - Food Insecurity Present (08/10/2022)   Hunger Vital Sign    Worried About Running Out of Food in the Last Year: Sometimes true    Ran Out of Food in the Last Year: Sometimes true  Transportation Needs: No Transportation Needs (09/26/2022)   PRAPARE - Administrator, Civil Service (Medical): No    Lack of Transportation (Non-Medical): No  Physical Activity: Not on file  Stress: Not on file  Social  Connections: Not on file  Intimate Partner Violence: Not At Risk (08/10/2022)   Humiliation, Afraid, Rape, and Kick questionnaire    Fear of Current or Ex-Partner: No    Emotionally Abused: No    Physically Abused: No    Sexually Abused: No    Physical Exam: Vital signs in last 24 hours: Temp:  [97.6 F (36.4 C)-98.7 F (37.1 C)] 97.6 F (36.4 C) (05/18 0747) Pulse Rate:  [62-70] 66 (05/18 0930) Resp:  [13-33] 33 (05/18 0930) BP: (122-153)/(46-108) 148/63 (05/18 0930) SpO2:  [98 %-100 %] 100 % (05/18 0930)   General:   Alert,  Well-developed, well-nourished, pleasant and cooperative in NAD Head:  Normocephalic and atraumatic. Eyes:  Sclera clear, no icterus.   Conjunctiva pink. Ears:  Normal auditory acuity. Nose:  No deformity, discharge,  or lesions. Lungs:  Clear throughout to auscultation.   No wheezes, crackles, or rhonchi. No acute distress. Heart:  Regular rate and rhythm; no murmurs, clicks, rubs,  or gallops. Abdomen:  Soft, nontender and nondistended. No masses, hepatosplenomegaly or hernias noted. Normal bowel sounds,  without guarding, and without rebound.   Extremities:  Without clubbing or edema.  Intake/Output from previous day: 05/17 0701 - 05/18 0700 In: 315 [Blood:315] Out: -  Intake/Output this shift: No intake/output data recorded.  Lab Results: Recent Labs    05/25/23 1024 05/25/23 1937 05/26/23 0427  WBC 8.2  --  8.0  HGB 6.7* 6.9* 8.5*  HCT 22.7* 22.1* 26.7*  PLT 270  --  215   BMET Recent Labs    05/25/23 1024  NA 136  K 3.3*  CL 99  CO2 24  GLUCOSE 134*  BUN 28*  CREATININE 3.22*  CALCIUM  9.0   LFT Recent Labs    05/25/23 1024  PROT 7.7  ALBUMIN 3.7  AST 36  ALT 37  ALKPHOS 75  BILITOT 0.9   PT/INR Recent Labs    05/25/23 1024  LABPROT 13.6  INR 1.0   Hepatitis Panel No results for input(s): "HEPBSAG", "HCVAB", "HEPAIGM", "HEPBIGM" in the last 72 hours.  Studies/Results: DG Chest Portable 1 View Result Date:  05/25/2023 CLINICAL DATA:  SOB EXAM: PORTABLE CHEST - 1 VIEW COMPARISON:  01/04/2022 and previous FINDINGS: Stable tunneled right IJ HD catheter to the cavoatrial junction. Mild patchy opacities peripherally at the right lung base, and left retrocardiac. Heart size and mediastinal contours are within normal limits. Aortic Atherosclerosis (ICD10-170.0). No effusion. Visualized bones unremarkable. IMPRESSION: Mild patchy bibasilar opacities. Electronically Signed   By: Nicoletta Barrier M.D.   On: 05/25/2023 10:40    Assessment/Plan:  89 y.o. female with medical history significant of ESRD on HD TTS, HTN, PVD s/p multiple stenting procedures on Plavix , multivessel CAD with known RCA occlusion with collaterals, IDDM, chronic iron  deficiency anemia no longer on iron  supplement, COPD with chronic hypoxic respiratory failure on 2 L nightly, dementia, sent from dialysis center last night for evaluation of shortness of breath.  She also had intermittent black stool for a week. Upon arriving the hospital, hemoglobin was 6.7, received 2 units of transfusion.  ESRD: Patient has been on TTS schedule.  Will assess her tomorrow morning for need of dialysis.  ANEMIA: Status post 2 units of blood transfusion.  Hemoglobin is 8.5 today.  MBD: We will check calcium , phosphorus and PTH levels.  Diabetes: Continue insulin  as per protocol.  HTN/VOL: Continue hydralazine .  Labs and medications reviewed. Will continue to monitor closely.    LOS: 1 Worthy Heads, MD [facility] @TODAY @1 :10 PM

## 2023-05-26 NOTE — ED Notes (Signed)
 This tech assisted pt on and off bedside commode. Pt is now resting in bed with no further needs.

## 2023-05-26 NOTE — ED Notes (Signed)
 Pt assisted to bed side commode. No brief applied

## 2023-05-27 DIAGNOSIS — K921 Melena: Secondary | ICD-10-CM | POA: Diagnosis not present

## 2023-05-27 DIAGNOSIS — D649 Anemia, unspecified: Secondary | ICD-10-CM | POA: Diagnosis not present

## 2023-05-27 DIAGNOSIS — N186 End stage renal disease: Secondary | ICD-10-CM | POA: Diagnosis not present

## 2023-05-27 DIAGNOSIS — E1122 Type 2 diabetes mellitus with diabetic chronic kidney disease: Secondary | ICD-10-CM | POA: Diagnosis not present

## 2023-05-27 DIAGNOSIS — K922 Gastrointestinal hemorrhage, unspecified: Secondary | ICD-10-CM | POA: Diagnosis not present

## 2023-05-27 DIAGNOSIS — D62 Acute posthemorrhagic anemia: Secondary | ICD-10-CM | POA: Diagnosis not present

## 2023-05-27 LAB — HEMOGLOBIN
Hemoglobin: 9.8 g/dL — ABNORMAL LOW (ref 12.0–15.0)
Hemoglobin: 9.6 g/dL — ABNORMAL LOW (ref 12.0–15.0)

## 2023-05-27 LAB — GLUCOSE, CAPILLARY
Glucose-Capillary: 103 mg/dL — ABNORMAL HIGH (ref 70–99)
Glucose-Capillary: 118 mg/dL — ABNORMAL HIGH (ref 70–99)
Glucose-Capillary: 93 mg/dL (ref 70–99)

## 2023-05-27 NOTE — Progress Notes (Addendum)
 Referring Provider: No ref. provider found Primary Care Physician:  Jacques Mattock, PA-C Primary Nephrologist:  Dr.   Lea Primmer for Consultation: ESRD  HPI: 89 y.o. female with medical history significant of ESRD on HD TTS, HTN, PVD s/p multiple stenting procedures on Plavix , multivessel CAD with known RCA occlusion with collaterals, IDDM, chronic iron  deficiency anemia no longer on iron  supplement, COPD with chronic hypoxic respiratory failure on 2 L nightly, dementia, sent from dialysis center for evaluation of shortness of breath.  She also had intermittent black stool for a week. Upon arriving the hospital, hemoglobin was 6.7, received 2 units of transfusion.  Update Patient seen sitting up in bed No family present Alert and oriented Continues to complain of weakness and fatigue Room air No lower extremity edema    Past Medical History:  Diagnosis Date   Arthritis    Asthma    Calf pain    Cancer (HCC) 2016   left breast   COPD (chronic obstructive pulmonary disease) (HCC)    Diabetes (HCC)    Discoid lupus    Gastro-esophageal reflux    Glaucoma    Hyperlipemia    Hypertension    Iron  deficiency anemia    Joint pain    Leg swelling    Osteoarthritis    PVD (peripheral vascular disease) (HCC)    Sinus problem    Sleep apnea    No CPAP/ Can't tolerate   Stroke (HCC) 1987    Past Surgical History:  Procedure Laterality Date   BREAST SURGERY Left    left lumpectomy   CAROTID ANGIOGRAPHY Left 06/25/2018   Procedure: CAROTID ANGIOGRAPHY, possible intervention;  Surgeon: Jackquelyn Mass, MD;  Location: [facility];  Service: Cardiovascular;  Laterality: Left;   CAROTID ENDARTERECTOMY Right    CAROTID PTA/STENT INTERVENTION Left 06/25/2018   Procedure: CAROTID PTA/STENT INTERVENTION;  Surgeon: Jackquelyn Mass, MD;  Location: [facility];  Service: Cardiovascular;  Laterality: Left;   CATARACT EXTRACTION Right 2013   CORONARY STENT PLACEMENT      L. L. E.   DIALYSIS/PERMA CATHETER INSERTION N/A 12/29/2020   Procedure: DIALYSIS/PERMA CATHETER INSERTION;  Surgeon: Celso College, MD;  Location: [facility];  Service: Cardiovascular;  Laterality: N/A;   DIALYSIS/PERMA CATHETER INSERTION N/A 12/14/2021   Procedure: DIALYSIS/PERMA CATHETER INSERTION;  Surgeon: Jackquelyn Mass, MD;  Location: [facility];  Service: Cardiovascular;  Laterality: N/A;   EYE SURGERY Bilateral    cataract   LEFT HEART CATH AND CORONARY ANGIOGRAPHY Left 05/06/2018   Procedure: LEFT HEART CATH AND CORONARY ANGIOGRAPHY;  Surgeon: Michelle Aid, MD;  Location: [facility];  Service: Cardiovascular;  Laterality: Left;   LOWER EXTREMITY ANGIOGRAPHY Right 01/22/2018   Procedure: LOWER EXTREMITY ANGIOGRAPHY;  Surgeon: Jackquelyn Mass, MD;  Location: [facility];  Service: Cardiovascular;  Laterality: Right;   RENAL ANGIOGRAPHY Left 02/26/2018   Procedure: RENAL ANGIOGRAPHY;  Surgeon: Jackquelyn Mass, MD;  Location: [facility];  Service: Cardiovascular;  Laterality: Left;   STENT PLACEMENT VASCULAR ([facility] HX) Left    stent placement on LLE   TUBAL LIGATION      Prior to Admission medications   Medication Sig Start Date End Date Taking? Authorizing Provider  acetaminophen  (TYLENOL ) 500 MG tablet Take 1 tablet (500 mg total) by mouth every 6 (six) hours as needed for moderate pain or headache. 12/31/20  Yes Patel, Sona, MD  albuterol  (VENTOLIN  HFA) 108 (  90 Base) MCG/ACT inhaler Inhale 2 puffs into the lungs every 6 (six) hours as needed for wheezing or shortness of breath. 02/01/23  Yes McDonough, Lauren K, PA-C  amLODipine  (NORVASC ) 10 MG tablet TAKE 1 TABLET EVERY DAY AT [facility] 06/14/21  Yes Khan, Fozia M, MD  atorvastatin  (LIPITOR) 40 MG tablet Take 40 mg by mouth daily.   Yes [provider]  clopidogrel  (PLAVIX ) 75 MG tablet TAKE 1 TABLET DAILY AT DINNER 05/26/21  Yes McDonough, Lauren K, PA-C  docusate  sodium (COLACE) 100 MG capsule Take 200 mg by mouth at bedtime as needed for mild constipation.   Yes [provider]  donepezil  (ARICEPT ) 10 MG tablet TAKE 1 TABLET AT BEDTIME FOR MEMORY (NEED MD APPOINTMENT) 02/01/23  Yes McDonough, Lauren K, PA-C  hydrALAZINE  (APRESOLINE ) 100 MG tablet Take 100 mg by mouth 3 (three) times daily.   Yes [provider]  isosorbide  mononitrate (IMDUR ) 60 MG 24 hr tablet Take 60 mg by mouth daily. 12/13/20  Yes [provider]  torsemide (DEMADEX) 100 MG tablet Take 100 mg by mouth daily. At noon   Yes [provider]  ACCU-CHEK GUIDE test strip TEST BLOOD SUGAR THREE TIMES DAILY  AS  DIRECTED 04/27/20   Khan, Fozia M, MD  Accu-Chek Softclix Lancets lancets USE AS INSTRUCTED 3 TIMES A DAY 09/22/19   Khan, Fozia M, MD  Alcohol Swabs (B-D SINGLE USE SWABS REGULAR) PADS Use as directed for 3 times daily DX E11.65 06/22/19   Sheria Dills, NP  Blood Glucose Monitoring Suppl (ACCU-CHEK GUIDE ME) w/Device KIT Use as instructed. DX e11.65 06/18/19   Scarboro, Adam J, NP  Budeson-Glycopyrrol-Formoterol  (BREZTRI  AEROSPHERE) 160-9-4.8 MCG/ACT AERO Inhale 2 puffs into the lungs 2 (two) times daily. Patient not taking: Reported on 05/25/2023 02/01/23   Jacques Mattock, PA-C  doxycycline  (ADOXA) 100 MG tablet Take 100 mg by mouth daily. Patient not taking: Reported on 05/25/2023 11/06/22   [provider]  ergocalciferol  (VITAMIN D2) 1.25 MG (50000 UT) capsule Take 1 capsule (50,000 Units total) by mouth once a week. Patient not taking: Reported on 05/25/2023 06/26/22   McDonough, Lauren K, PA-C  hydrALAZINE  (APRESOLINE ) 50 MG tablet Take 100 mg by mouth in the morning and at bedtime. Take one tablet in AM and two tablets in PM 12/12/20 12/12/21  [provider]  insulin  NPH-regular Human (70-30) 100 UNIT/ML injection Inject 12 Units into the skin 2 (two) times daily. Inject 20 units subcutaneously in the morning and 20 units in the  evening. Patient not taking: Reported on 05/25/2023 12/31/20   Patel, Sona, MD  traZODone (DESYREL) 50 MG tablet  12/21/22   [provider]    Current Facility-Administered Medications  Medication Dose Route Frequency Provider Last Rate Last Admin   acetaminophen  (TYLENOL ) tablet 500 mg  500 mg Oral Q6H PRN Antoniette Batty T, MD   500 mg at 05/26/23 2128   albuterol  (PROVENTIL ) (2.5 MG/3ML) 0.083% nebulizer solution 2.5 mg  2.5 mg Inhalation Q6H PRN [place], MD       atorvastatin  (LIPITOR) tablet 40 mg  40 mg Oral Daily Antoniette Batty T, MD   40 mg at 05/27/23 1002   budesonide -glycopyrrolate -formoterol  (BREZTRI ) 160-9-4.8 MCG/ACT inhaler 2 puff  2 puff Inhalation BID [place], MD   2 puff at 05/26/23 2119   docusate sodium  (COLACE) capsule 200 mg  200 mg Oral QHS PRN [place], MD       donepezil  (  ARICEPT ) tablet 10 mg  10 mg Oral QHS Antoniette Batty T, MD   10 mg at 05/26/23 2119   ferrous sulfate  tablet 325 mg  325 mg Oral Q breakfast Antoniette Batty T, MD   325 mg at 05/27/23 1002   hydrALAZINE  (APRESOLINE ) injection 5 mg  5 mg Intravenous Q6H PRN [place], MD       insulin  aspart (novoLOG ) injection 0-5 Units  0-5 Units Subcutaneous QHS Antoniette Batty T, MD       insulin  aspart (novoLOG ) injection 0-6 Units  0-6 Units Subcutaneous TID WC [place], MD       ondansetron  (ZOFRAN ) tablet 4 mg  4 mg Oral Q6H PRN Antoniette Batty T, MD       Or   ondansetron  (ZOFRAN ) injection 4 mg  4 mg Intravenous Q6H PRN Antoniette Batty T, MD       pantoprazole  (PROTONIX ) injection 40 mg  40 mg Intravenous Q12H Antoniette Batty T, MD   40 mg at 05/27/23 1002    Allergies as of 05/25/2023 - Review Complete 05/25/2023  Allergen Reaction Noted   Benazepril Nausea Only 05/28/2013    Family History  Problem Relation Age of Onset   Heart disease Mother    Diabetes Sister    Leukemia Daughter    Lung cancer Brother    Diabetes Other    Hypertension Other    Bladder Cancer Neg Hx    Kidney disease  Neg Hx    Prostate cancer Neg Hx    Breast cancer Neg Hx     Social History   Socioeconomic History   Marital status: Married    Spouse name: Not on file   Number of children: Not on file   Years of education: Not on file   Highest education level: Not on file  Occupational History   Not on file  Tobacco Use   Smoking status: Former    Current packs/day: 0.00    Types: Cigarettes    Quit date: 1987    Years since quitting: 38.4   Smokeless tobacco: Never   Tobacco comments:    quit 31 years  Vaping Use   Vaping status: Never Used  Substance and Sexual Activity   Alcohol use: No   Drug use: No   Sexual activity: Not on file  Other Topics Concern   Not on file  Social History Narrative   Walks with a rolling walker; remote hx of smoking; no alcohol; [place]- with husband/son. Daughter lives in [place].    Social Drivers of Corporate investment banker Strain: Low Risk  (06/23/2020)   Overall Financial Resource Strain (CARDIA)    Difficulty of Paying Living Expenses: Not very hard  Food Insecurity: No Food Insecurity (05/26/2023)   Hunger Vital Sign    Worried About Running Out of Food in the Last Year: Never true    Ran Out of Food in the Last Year: Never true  Transportation Needs: No Transportation Needs (05/26/2023)   PRAPARE - Administrator, Civil Service (Medical): No    Lack of Transportation (Non-Medical): No  Physical Activity: Not on file  Stress: Not on file  Social Connections: Moderately Isolated (05/26/2023)   Social Connection and Isolation Panel [NHANES]    Frequency of Communication with Friends and Family: More than three times a week    Frequency of Social Gatherings with Friends and Family: More than three times a week    Attends Religious Services:  Never    Active Member of Clubs or Organizations: No    Attends Club or Organization Meetings: Never    Marital Status: Married  Catering manager Violence: Not At Risk (05/26/2023)    Humiliation, Afraid, Rape, and Kick questionnaire    Fear of Current or Ex-Partner: No    Emotionally Abused: No    Physically Abused: No    Sexually Abused: No    Physical Exam: Vital signs in last 24 hours: Temp:  [97.4 F (36.3 C)-98.7 F (37.1 C)] 98.4 F (36.9 C) (05/19 0747) Pulse Rate:  [65-72] 72 (05/19 0747) Resp:  [16-18] 18 (05/19 0747) BP: (105-169)/(63-73) 169/73 (05/19 0747) SpO2:  [94 %-100 %] 99 % (05/19 0747)   General:   Alert,  Well-developed, well-nourished, pleasant and cooperative in NAD Head:  Normocephalic and atraumatic. Eyes:  Sclera clear, no icterus.   Conjunctiva pink. Ears:  Normal auditory acuity. Nose:  No deformity, discharge,  or lesions. Lungs:  Clear throughout to auscultation.  No acute distress. Heart:  Regular rate and rhythm; no murmurs, clicks, rubs,  or gallops. Abdomen:  Soft, nontender and nondistended. No masses, hepatosplenomegaly or hernias noted. Normal bowel sounds, without guarding, and without rebound.   Extremities:  Without clubbing or edema.  Intake/Output from previous day: 05/18 0701 - 05/19 0700 In: 240 [P.O.:240] Out: -  Intake/Output this shift: No intake/output data recorded.  Lab Results: Recent Labs    05/25/23 1024 05/25/23 1937 05/26/23 0427 05/26/23 1411 05/26/23 2247 05/27/23 0724  WBC 8.2  --  8.0  --   --   --   HGB 6.7* 6.9* 8.5* 9.3* 8.6* 9.8*  HCT 22.7* 22.1* 26.7*  --   --   --   PLT 270  --  215  --   --   --    BMET Recent Labs    05/25/23 1024  NA 136  K 3.3*  CL 99  CO2 24  GLUCOSE 134*  BUN 28*  CREATININE 3.22*  CALCIUM  9.0   LFT Recent Labs    05/25/23 1024  PROT 7.7  ALBUMIN 3.7  AST 36  ALT 37  ALKPHOS 75  BILITOT 0.9   PT/INR Recent Labs    05/25/23 1024  LABPROT 13.6  INR 1.0   Hepatitis Panel No results for input(s): "HEPBSAG", "HCVAB", "HEPAIGM", "HEPBIGM" in the last 72 hours.  Studies/Results: No results found.   Assessment/Plan:  89 y.o.  female with medical history significant of ESRD on HD TTS, HTN, PVD s/p multiple stenting procedures on Plavix , multivessel CAD with known RCA occlusion with collaterals, IDDM, chronic iron  deficiency anemia no longer on iron  supplement, COPD with chronic hypoxic respiratory failure on 2 L nightly, dementia, sent from dialysis center last night for evaluation of shortness of breath.  She also had intermittent black stool for a week. Upon arriving the hospital, hemoglobin was 6.7, received 2 units of transfusion.  [facility]/TTS/right upper aVF  ESRD: Will continue outpatient TTS schedule during this admission.  Next treatment scheduled for Tuesday.  Will order hepatitis B antigen per hospital protocol.  ANEMIA with chronic kidney disease/acute blood loss: Status post 2 units of blood transfusion.  GI consulted and will monitor for now.  Hemoglobin continues to improve, 9.8 today.  MBD: Will continue to monitor bone minerals.  Calcium  9.0.  Diabetes melitis type II with chronic kidney disease.:  Insulin -dependent.  Primary team to continue management of sliding scale insulin .  HTN with chronic kidney disease:  Home regimen includes hydralazine , torsemide, amlodipine , isosorbide .  All currently held, as needed hydralazine  available.    LOS: 2 Anola King, [facility] @TODAY @11 :12 AM

## 2023-05-27 NOTE — Care Management Important Message (Signed)
 Important Message  Patient Details  Name: Jennifer Carrillo MRN: [id number] Date of Birth: October 15, 1933   Important Message Given:  Yes - Medicare IM     Anise Kerns 05/27/2023, 12:52 PM

## 2023-05-27 NOTE — Plan of Care (Signed)

## 2023-05-27 NOTE — Progress Notes (Addendum)
    Marnee Sink, MD [facility]   [address], [address] [place], [place] [address] Phone: [phone] Fax : [phone]   Subjective: The patient was admitted with black stools while on Plavix .  The patient had been evaluated in the past and 2021 at [facility] with a 3 sessile polyp on the cecal valve and transverse colon that was resected.  Patient's hemoglobin 8.1 back in June last year and came in with a hemoglobin of 6.7.  The patient was transfused and her hemoglobin is now 9.8 this morning.   Objective: Vital signs in last 24 hours: Vitals:   05/26/23 1645 05/26/23 2123 05/27/23 0613 05/27/23 0747  BP: (!) 146/63 (!) 148/64 (!) 164/67 (!) 169/73  Pulse: 68 67 71 72  Resp: 16   18  Temp: 97.9 F (36.6 C) 98.6 F (37 C) 98.4 F (36.9 C) 98.4 F (36.9 C)  TempSrc:  Oral Oral Oral  SpO2: 99% 97% 94% 99%  Weight:      Height:       Weight change:   Intake/Output Summary (Last 24 hours) at 05/27/2023 1228 Last data filed at 05/27/2023 1200 Gross per 24 hour  Intake 240 ml  Output 100 ml  Net 140 ml     Exam: Heart:: Regular rate and rhythm or without murmur or extra heart sounds Lungs: normal and clear to auscultation and percussion Abdomen: soft, nontender, normal bowel sounds   Lab Results: @LABTEST2 @ Micro Results: No results found for this or any previous visit (from the past 240 hours). Studies/Results: No results found. Medications: I have reviewed the patient's current medications. Scheduled Meds:  atorvastatin   40 mg Oral Daily   budesonide -glycopyrrolate -formoterol   2 puff Inhalation BID   donepezil   10 mg Oral QHS   ferrous sulfate   325 mg Oral Q breakfast   insulin  aspart  0-5 Units Subcutaneous QHS   insulin  aspart  0-6 Units Subcutaneous TID WC   pantoprazole  (PROTONIX ) IV  40 mg Intravenous Q12H   Continuous Infusions: PRN Meds:.acetaminophen , albuterol , docusate sodium , hydrALAZINE , ondansetron  **OR** ondansetron  (ZOFRAN ) IV   Assessment: Principal  Problem:   Lower GI bleed Active Problems:   Benign essential HTN   End stage renal disease on dialysis due to type 2 diabetes mellitus (HCC)   GI bleed   Acute blood loss anemia   Hypokalemia   Acute upper GI bleed    Plan: This patient had anemia with melena while on Plavix .  The patient will be set up for a upper endoscopy for tomorrow.  The patient will be kept n.p.o. after midnight.  The patient has been explained the plan and agrees with it.    LOS: 2 days   Marnee Sink, MD.FACG 05/27/2023, 12:28 PM Pager [phone] 7am-5pm  Check AMION for 5pm -7am coverage and on weekends

## 2023-05-27 NOTE — Progress Notes (Signed)
  Progress Note   Patient: Jennifer Carrillo:[id number] DOB: Apr 03, 1933 DOA: 05/25/2023     2 DOS: the patient was seen and examined on 05/27/2023   Brief hospital course: Jennifer Carrillo is a 89 y.o. female with medical history significant of ESRD on HD TTS, HTN, PVD s/p multiple stenting and femoral artery renal artery left carotid artery on Plavix , multivessel CAD with known RCA occlusion with collaterals, IDDM, chronic iron  deficiency anemia no longer on iron  supplement, COPD with chronic hypoxic respiratory failure on 2 L nightly, dementia, sent from dialysis center for evaluation of shortness of breath.  He also had intermittent black stool for a week. Upon arriving the hospital, hemoglobin was 6.7, received 2 units of transfusion.   Active Problems:   Benign essential HTN   End stage renal disease on dialysis due to type 2 diabetes mellitus (HCC)   GI bleed   Acute blood loss anemia   Hypokalemia   Acute upper GI bleed   Symptomatic anemia   Assessment and Plan: Acute blood loss anemia secondary to GI bleed. Upper GI bleed probably secondary to peptic ulcer disease. Patient has been having black stools, hemoglobin dropped down to 6.7.  Hemoglobin 8.5 after 2 units of blood transfusion.  Continue Protonix  twice a day. Continue monitor hemoglobin, transfuse as needed. Patient does not have additional black stools, hemoglobin went up to 9.6.  Appreciate GI consult.  Hold off EGD due to Plavix  use.   End-stage renal disease on dialysis. Hypokalemia. Followed by nephrology for scheduled dialysis.   Essential hypertension. Medication on hold due to acute bleeding.   Type 2 diabetes Continue sliding scale insulin .   Overweight with BMI 28.36. Diet and excise.   Dementia. Continue to follow.       Subjective:  Patient feels well today, no additional rectal bleeding.  Physical Exam: Vitals:   05/26/23 2123 05/27/23 0613 05/27/23 0747 05/27/23 1523  BP: (!) 148/64  (!) 164/67 (!) 169/73 (!) 171/66  Pulse: 67 71 72 70  Resp:   18 18  Temp: 98.6 F (37 C) 98.4 F (36.9 C) 98.4 F (36.9 C) 98.6 F (37 C)  TempSrc: Oral Oral Oral Oral  SpO2: 97% 94% 99% 100%  Weight:      Height:       General exam: Appears calm and comfortable  Respiratory system: Clear to auscultation. Respiratory effort normal. Cardiovascular system: S1 & S2 heard, RRR. No JVD, murmurs, rubs, gallops or clicks. No pedal edema. Gastrointestinal system: Abdomen is nondistended, soft and nontender. No organomegaly or masses felt. Normal bowel sounds heard. Central nervous system: Alert and oriented x2. No focal neurological deficits. Extremities: Symmetric 5 x 5 power. Skin: No rashes, lesions or ulcers Psychiatry: Judgement and insight appear normal. Mood & affect appropriate.    Data Reviewed:  Lab results reviewed.  Family Communication: None  Disposition: Status is: Inpatient Remains inpatient appropriate because: Severity of disease.     Time spent: 35 minutes  Author: Donaciano Frizzle, MD 05/27/2023 3:28 PM  For on call review [email or web address].

## 2023-05-27 NOTE — TOC CM/SW Note (Signed)
 Transition of Care [facility]) - Inpatient Brief Assessment   Patient Details  Name: Jennifer Carrillo MRN: [id number] Date of Birth: 09/14/1933  Transition of Care [facility]) CM/SW Contact:    Odilia Bennett, LCSW Phone Number: 05/27/2023, 10:53 AM   Clinical Narrative: CSW reviewed chart. No TOC needs identified so far. CSW will continue to follow progress. Please place [facility] consult if any needs arise.  Transition of Care Asessment: Insurance and Status: Insurance coverage has been reviewed Patient has primary care physician: Yes Home environment has been reviewed: Single family home Prior level of function:: Not documented Prior/Current Home Services: No current home services Social Drivers of Health Review: SDOH reviewed no interventions necessary Readmission risk has been reviewed: Yes Transition of care needs: no transition of care needs at this time

## 2023-05-28 ENCOUNTER — Inpatient Hospital Stay

## 2023-05-28 ENCOUNTER — Encounter
Admission: EM | Disposition: A | Discharge status: Home Health | Payer: Self-pay | Source: Ambulatory Visit | Attending: Internal Medicine

## 2023-05-28 ENCOUNTER — Encounter: Payer: Self-pay | Admitting: Internal Medicine

## 2023-05-28 DIAGNOSIS — E1122 Type 2 diabetes mellitus with diabetic chronic kidney disease: Secondary | ICD-10-CM | POA: Diagnosis not present

## 2023-05-28 DIAGNOSIS — K31819 Angiodysplasia of stomach and duodenum without bleeding: Secondary | ICD-10-CM

## 2023-05-28 DIAGNOSIS — K922 Gastrointestinal hemorrhage, unspecified: Secondary | ICD-10-CM | POA: Diagnosis not present

## 2023-05-28 DIAGNOSIS — D62 Acute posthemorrhagic anemia: Secondary | ICD-10-CM | POA: Diagnosis not present

## 2023-05-28 HISTORY — PX: ESOPHAGOGASTRODUODENOSCOPY: SHX5428

## 2023-05-28 HISTORY — PX: HOT HEMOSTASIS: SHX5433

## 2023-05-28 LAB — CBC
HCT: 30 % — ABNORMAL LOW (ref 36.0–46.0)
Hemoglobin: 9.4 g/dL — ABNORMAL LOW (ref 12.0–15.0)
MCH: 27.8 pg (ref 26.0–34.0)
MCHC: 31.3 g/dL (ref 30.0–36.0)
MCV: 88.8 fL (ref 80.0–100.0)
Platelets: 218 10*3/uL (ref 150–400)
RBC: 3.38 MIL/uL — ABNORMAL LOW (ref 3.87–5.11)
RDW: 17.6 % — ABNORMAL HIGH (ref 11.5–15.5)
WBC: 8.1 10*3/uL (ref 4.0–10.5)
nRBC: 0 % (ref 0.0–0.2)

## 2023-05-28 LAB — BASIC METABOLIC PANEL WITH GFR
Anion gap: 13 (ref 5–15)
BUN: 49 mg/dL — ABNORMAL HIGH (ref 8–23)
CO2: 21 mmol/L — ABNORMAL LOW (ref 22–32)
Calcium: 9.1 mg/dL (ref 8.9–10.3)
Chloride: 105 mmol/L (ref 98–111)
Creatinine, Ser: 6.18 mg/dL — ABNORMAL HIGH (ref 0.44–1.00)
GFR, Estimated: 6 mL/min — ABNORMAL LOW (ref >60)
Glucose, Bld: 100 mg/dL — ABNORMAL HIGH (ref 70–99)
Potassium: 4.1 mmol/L (ref 3.5–5.1)
Sodium: 139 mmol/L (ref 135–145)

## 2023-05-28 LAB — HEPATITIS B SURFACE ANTIGEN: Hepatitis B Surface Ag: NONREACTIVE

## 2023-05-28 LAB — GLUCOSE, CAPILLARY: Glucose-Capillary: 85 mg/dL (ref 70–99)

## 2023-05-28 SURGERY — EGD (ESOPHAGOGASTRODUODENOSCOPY)
Anesthesia: General

## 2023-05-28 MED ORDER — FERROUS SULFATE 325 (65 FE) MG PO TABS: 325.0000 mg | ORAL_TABLET | Freq: Every day | ORAL | 0 refills | Status: DC

## 2023-05-28 MED ORDER — ACETAMINOPHEN 500 MG PO TABS
ORAL_TABLET | ORAL | Status: AC
  Filled 2023-05-28: qty 1

## 2023-05-28 MED ORDER — HEPARIN SODIUM (PORCINE) 1000 UNIT/ML IJ SOLN: 5300.0000 [IU] | Freq: Once | INTRAMUSCULAR | Status: DC

## 2023-05-28 MED ORDER — PANTOPRAZOLE SODIUM 40 MG PO TBEC
40.0000 mg | DELAYED_RELEASE_TABLET | Freq: Every day | ORAL | 0 refills | Status: DC
Start: 2023-05-28 — End: 2023-08-27

## 2023-05-28 MED ORDER — CHLORHEXIDINE GLUCONATE CLOTH 2 % EX PADS
6.0000 | MEDICATED_PAD | Freq: Every day | CUTANEOUS | Status: DC
  Administered 2023-05-28: 6 via TOPICAL

## 2023-05-28 MED ORDER — LIDOCAINE HCL (PF) 2 % IJ SOLN
INTRAMUSCULAR | Status: DC | PRN
  Administered 2023-05-28: 100 mg via INTRADERMAL

## 2023-05-28 MED ORDER — ONDANSETRON HCL 4 MG/2ML IJ SOLN
INTRAMUSCULAR | Status: AC
  Filled 2023-05-28: qty 2

## 2023-05-28 MED ORDER — PROPOFOL 500 MG/50ML IV EMUL
INTRAVENOUS | Status: DC | PRN
  Administered 2023-05-28: 100 ug/kg/min via INTRAVENOUS
  Administered 2023-05-28: 100 mg via INTRAVENOUS

## 2023-05-28 MED ORDER — SODIUM CHLORIDE 0.9 % IV SOLN: INTRAVENOUS | Status: DC

## 2023-05-28 NOTE — Progress Notes (Signed)
 Received patient in bed.Alert and oriented x 4. Consent verified.  Access used : Right  hd catheter that worked well.Dressing change done today.  Duration of treatment: 3.5 hours.  Uf goal : 900 CC  Medicines given : Tylenol  500 mg.                               Zofran  4 mg.  Hemo comment : She quit on her last hour of treatment ,she signed an AMA form.She said" I dont want my son to wait for long " Patient is discharging home.  Hand off to the patient's nurse,into her room with stable condition via transporter.

## 2023-05-28 NOTE — Progress Notes (Signed)
 Referring Provider: No ref. provider found Primary Care Physician:  Jacques Mattock, PA-C Primary Nephrologist:  Dr.   Lea Primmer for Consultation: ESRD  HPI: 89 y.o. female with medical history significant of ESRD on HD TTS, HTN, PVD s/p multiple stenting procedures on Plavix , multivessel CAD with known RCA occlusion with collaterals, IDDM, chronic iron  deficiency anemia no longer on iron  supplement, COPD with chronic hypoxic respiratory failure on 2 L nightly, dementia, sent from dialysis center for evaluation of shortness of breath.  She also had intermittent black stool for a week. Upon arriving the hospital, hemoglobin was 6.7, received 2 units of transfusion.  Update Patient seen laying in bed NPO for EGD    Past Medical History:  Diagnosis Date   Arthritis    Asthma    Calf pain    Cancer (HCC) 2016   left breast   COPD (chronic obstructive pulmonary disease) (HCC)    Diabetes (HCC)    Discoid lupus    Gastro-esophageal reflux    Glaucoma    Hyperlipemia    Hypertension    Iron  deficiency anemia    Joint pain    Leg swelling    Osteoarthritis    PVD (peripheral vascular disease) (HCC)    Sinus problem    Sleep apnea    No CPAP/ Can't tolerate   Stroke (HCC) 1987    Past Surgical History:  Procedure Laterality Date   BREAST SURGERY Left    left lumpectomy   CAROTID ANGIOGRAPHY Left 06/25/2018   Procedure: CAROTID ANGIOGRAPHY, possible intervention;  Surgeon: Jackquelyn Mass, MD;  Location: [facility];  Service: Cardiovascular;  Laterality: Left;   CAROTID ENDARTERECTOMY Right    CAROTID PTA/STENT INTERVENTION Left 06/25/2018   Procedure: CAROTID PTA/STENT INTERVENTION;  Surgeon: Jackquelyn Mass, MD;  Location: [facility];  Service: Cardiovascular;  Laterality: Left;   CATARACT EXTRACTION Right 2013   CORONARY STENT PLACEMENT     L. L. E.   DIALYSIS/PERMA CATHETER INSERTION N/A 12/29/2020   Procedure: DIALYSIS/PERMA CATHETER INSERTION;   Surgeon: Celso College, MD;  Location: [facility];  Service: Cardiovascular;  Laterality: N/A;   DIALYSIS/PERMA CATHETER INSERTION N/A 12/14/2021   Procedure: DIALYSIS/PERMA CATHETER INSERTION;  Surgeon: Jackquelyn Mass, MD;  Location: [facility];  Service: Cardiovascular;  Laterality: N/A;   EYE SURGERY Bilateral    cataract   LEFT HEART CATH AND CORONARY ANGIOGRAPHY Left 05/06/2018   Procedure: LEFT HEART CATH AND CORONARY ANGIOGRAPHY;  Surgeon: Michelle Aid, MD;  Location: [facility];  Service: Cardiovascular;  Laterality: Left;   LOWER EXTREMITY ANGIOGRAPHY Right 01/22/2018   Procedure: LOWER EXTREMITY ANGIOGRAPHY;  Surgeon: Jackquelyn Mass, MD;  Location: [facility];  Service: Cardiovascular;  Laterality: Right;   RENAL ANGIOGRAPHY Left 02/26/2018   Procedure: RENAL ANGIOGRAPHY;  Surgeon: Jackquelyn Mass, MD;  Location: [facility];  Service: Cardiovascular;  Laterality: Left;   STENT PLACEMENT VASCULAR ([facility] HX) Left    stent placement on LLE   TUBAL LIGATION      Prior to Admission medications   Medication Sig Start Date End Date Taking? Authorizing Provider  acetaminophen  (TYLENOL ) 500 MG tablet Take 1 tablet (500 mg total) by mouth every 6 (six) hours as needed for moderate pain or headache. 12/31/20  Yes Patel, Sona, MD  albuterol  (VENTOLIN  HFA) [phone] Base) MCG/ACT inhaler Inhale 2 puffs into the lungs every 6 (six) hours as needed for  wheezing or shortness of breath. 02/01/23  Yes McDonough, Lauren K, PA-C  amLODipine  (NORVASC ) 10 MG tablet TAKE 1 TABLET EVERY DAY AT [facility] 06/14/21  Yes Khan, Fozia M, MD  atorvastatin  (LIPITOR) 40 MG tablet Take 40 mg by mouth daily.   Yes [provider]  clopidogrel  (PLAVIX ) 75 MG tablet TAKE 1 TABLET DAILY AT DINNER 05/26/21  Yes McDonough, Lauren K, PA-C  docusate sodium  (COLACE) 100 MG capsule Take 200 mg by mouth at bedtime as needed for mild constipation.   Yes [provider]  donepezil  (ARICEPT ) 10 MG tablet TAKE 1 TABLET AT BEDTIME FOR MEMORY (NEED MD APPOINTMENT) 02/01/23  Yes McDonough, Lauren K, PA-C  hydrALAZINE  (APRESOLINE ) 100 MG tablet Take 100 mg by mouth 3 (three) times daily.   Yes [provider]  isosorbide  mononitrate (IMDUR ) 60 MG 24 hr tablet Take 60 mg by mouth daily. 12/13/20  Yes [provider]  torsemide (DEMADEX) 100 MG tablet Take 100 mg by mouth daily. At noon   Yes [provider]  ACCU-CHEK GUIDE test strip TEST BLOOD SUGAR THREE TIMES DAILY  AS  DIRECTED 04/27/20   Khan, Fozia M, MD  Accu-Chek Softclix Lancets lancets USE AS INSTRUCTED 3 TIMES A DAY 09/22/19   Khan, Fozia M, MD  Alcohol Swabs (B-D SINGLE USE SWABS REGULAR) PADS Use as directed for 3 times daily DX E11.65 06/22/19   Sheria Dills, NP  Blood Glucose Monitoring Suppl (ACCU-CHEK GUIDE ME) w/Device KIT Use as instructed. DX e11.65 06/18/19   Scarboro, Adam J, NP  Budeson-Glycopyrrol-Formoterol  (BREZTRI  AEROSPHERE) 160-9-4.8 MCG/ACT AERO Inhale 2 puffs into the lungs 2 (two) times daily. Patient not taking: Reported on 05/25/2023 02/01/23   Jacques Mattock, PA-C  doxycycline  (ADOXA) 100 MG tablet Take 100 mg by mouth daily. Patient not taking: Reported on 05/25/2023 11/06/22   [provider]  ergocalciferol  (VITAMIN D2) 1.25 MG (50000 UT) capsule Take 1 capsule (50,000 Units total) by mouth once a week. Patient not taking: Reported on 05/25/2023 06/26/22   Jacques Mattock, PA-C  hydrALAZINE  (APRESOLINE ) 50 MG tablet Take 100 mg by mouth in the morning and at bedtime. Take one tablet in AM and two tablets in PM 12/12/20 12/12/21  [provider]  insulin  NPH-regular Human (70-30) 100 UNIT/ML injection Inject 12 Units into the skin 2 (two) times daily. Inject 20 units subcutaneously in the morning and 20 units in the evening. Patient not taking: Reported on 05/25/2023 12/31/20   Patel, Sona, MD  traZODone (DESYREL) 50 MG  tablet  12/21/22   [provider]    Current Facility-Administered Medications  Medication Dose Route Frequency Provider Last Rate Last Admin   [MAR Hold] acetaminophen  (TYLENOL ) tablet 500 mg  500 mg Oral Q6H PRN Antoniette Batty T, MD   500 mg at 05/28/23 1022   [MAR Hold] albuterol  (PROVENTIL ) (2.5 MG/3ML) 0.083% nebulizer solution 2.5 mg  2.5 mg Inhalation Q6H PRN [place], MD       [MAR Hold] atorvastatin  (LIPITOR) tablet 40 mg  40 mg Oral Daily Antoniette Batty T, MD   40 mg at 05/28/23 1022   [MAR Hold] budesonide -glycopyrrolate -formoterol  (BREZTRI ) 160-9-4.8 MCG/ACT inhaler 2 puff  2 puff Inhalation BID Antoniette Batty T, MD   2 puff at 05/28/23 1023   [MAR Hold] Chlorhexidine  Gluconate Cloth 2 % PADS 6 each  6 each Topical Q0600 Anola King, NP       [MAR Hold] docusate sodium  (COLACE) capsule 200 mg  200 mg Oral QHS PRN [place], MD       [facility] Hold] donepezil  (ARICEPT ) tablet 10 mg  10 mg Oral QHS Antoniette Batty T, MD   10 mg at 05/27/23 2105   [facility] Hold] ferrous sulfate  tablet 325 mg  325 mg Oral Q breakfast Antoniette Batty T, MD   325 mg at 05/28/23 1023   [MAR Hold] hydrALAZINE  (APRESOLINE ) injection 5 mg  5 mg Intravenous Q6H PRN Antoniette Batty T, MD   5 mg at 05/27/23 1642   [MAR Hold] ondansetron  (ZOFRAN ) tablet 4 mg  4 mg Oral Q6H PRN [place], MD       Or   Evette Hoes Hold] ondansetron  (ZOFRAN ) injection 4 mg  4 mg Intravenous Q6H PRN [place], MD       [facility] Hold] pantoprazole  (PROTONIX ) injection 40 mg  40 mg Intravenous Q12H Antoniette Batty T, MD   40 mg at 05/28/23 1027    Allergies as of 05/25/2023 - Review Complete 05/25/2023  Allergen Reaction Noted   Benazepril Nausea Only 05/28/2013    Family History  Problem Relation Age of Onset   Heart disease Mother    Diabetes Sister    Leukemia Daughter    Lung cancer Brother    Diabetes Other    Hypertension Other    Bladder Cancer Neg Hx    Kidney disease Neg Hx    Prostate cancer Neg Hx    Breast cancer Neg Hx      Social History   Socioeconomic History   Marital status: Married    Spouse name: Not on file   Number of children: Not on file   Years of education: Not on file   Highest education level: Not on file  Occupational History   Not on file  Tobacco Use   Smoking status: Former    Current packs/day: 0.00    Types: Cigarettes    Quit date: 1987    Years since quitting: 38.4   Smokeless tobacco: Never   Tobacco comments:    quit 31 years  Vaping Use   Vaping status: Never Used  Substance and Sexual Activity   Alcohol use: No   Drug use: No   Sexual activity: Not on file  Other Topics Concern   Not on file  Social History Narrative   Walks with a rolling walker; remote hx of smoking; no alcohol; [place]- with husband/son. Daughter lives in [place].    Social Drivers of Corporate investment banker Strain: Low Risk  (06/23/2020)   Overall Financial Resource Strain (CARDIA)    Difficulty of Paying Living Expenses: Not very hard  Food Insecurity: No Food Insecurity (05/26/2023)   Hunger Vital Sign    Worried About Running Out of Food in the Last Year: Never true    Ran Out of Food in the Last Year: Never true  Transportation Needs: No Transportation Needs (05/26/2023)   PRAPARE - Administrator, Civil Service (Medical): No    Lack of Transportation (Non-Medical): No  Physical Activity: Not on file  Stress: Not on file  Social Connections: Moderately Isolated (05/26/2023)   Social Connection and Isolation Panel [NHANES]    Frequency of Communication with Friends and Family: More than three times a week    Frequency of Social Gatherings with Friends and Family: More than three times a week    Attends Religious Services: Never    Database administrator or Organizations: No  Attends Banker Meetings: Never    Marital Status: Married  Catering manager Violence: Not At Risk (05/26/2023)   Humiliation, Afraid, Rape, and Kick questionnaire    Fear of  Current or Ex-Partner: No    Emotionally Abused: No    Physically Abused: No    Sexually Abused: No    Physical Exam: Vital signs in last 24 hours: Temp:  [97.9 F (36.6 C)-98.9 F (37.2 C)] 97.9 F (36.6 C) (05/20 0806) Pulse Rate:  [68-74] 68 (05/20 0806) Resp:  [16-20] 16 (05/20 0806) BP: (148-171)/(49-66) 160/49 (05/20 0806) SpO2:  [98 %-100 %] 99 % (05/20 0806)   General:   Alert,  Well-developed, well-nourished, pleasant and cooperative in NAD Head:  Normocephalic and atraumatic. Eyes:  Sclera clear, no icterus.   Conjunctiva pink. Ears:  Normal auditory acuity. Nose:  No deformity, discharge,  or lesions. Lungs:  Clear throughout to auscultation.  No acute distress. Heart:  Regular rate and rhythm; no murmurs, clicks, rubs,  or gallops. Abdomen:  Soft, nontender and nondistended. No masses, hepatosplenomegaly or hernias noted. Normal bowel sounds, without guarding, and without rebound.   Extremities:  Without clubbing or edema.  Intake/Output from previous day: 05/19 0701 - 05/20 0700 In: 180 [P.O.:180] Out: 100 [Blood:100] Intake/Output this shift: No intake/output data recorded.  Lab Results: Recent Labs    05/25/23 1937 05/26/23 0427 05/26/23 1411 05/27/23 0724 05/27/23 1437 05/28/23 0504  WBC  --  8.0  --   --   --  8.1  HGB 6.9* 8.5*   < > 9.8* 9.6* 9.4*  HCT 22.1* 26.7*  --   --   --  30.0*  PLT  --  215  --   --   --  218   < > = values in this interval not displayed.   BMET Recent Labs    05/28/23 0504  NA 139  K 4.1  CL 105  CO2 21*  GLUCOSE 100*  BUN 49*  CREATININE 6.18*  CALCIUM  9.1   LFT No results for input(s): "PROT", "ALBUMIN", "AST", "ALT", "ALKPHOS", "BILITOT", "BILIDIR", "IBILI" in the last 72 hours.  PT/INR No results for input(s): "LABPROT", "INR" in the last 72 hours.  Hepatitis Panel No results for input(s): "HEPBSAG", "HCVAB", "HEPAIGM", "HEPBIGM" in the last 72 hours.  Studies/Results: No results  found.   Assessment/Plan:  89 y.o. female with medical history significant of ESRD on HD TTS, HTN, PVD s/p multiple stenting procedures on Plavix , multivessel CAD with known RCA occlusion with collaterals, IDDM, chronic iron  deficiency anemia no longer on iron  supplement, COPD with chronic hypoxic respiratory failure on 2 L nightly, dementia, sent from dialysis center last night for evaluation of shortness of breath.  She also had intermittent black stool for a week. Upon arriving the hospital, hemoglobin was 6.7, received 2 units of transfusion.  [facility] [place]/TTS/right upper aVF  ESRD: Will continue outpatient TTS schedule during this admission.  Will plan for dialysis after EGD.   ANEMIA with chronic kidney disease/acute blood loss: Status post 2 units of blood transfusion.  GI consulted and  plan for EGD today.  Hemoglobin 9.4.  Secondary Hyperparathyroidism:  Calcium  9.1. Awaiting updated phos  Diabetes melitis type II with chronic kidney disease.:  Insulin -dependent.  Primary team to continue management of sliding scale insulin . Well controlled  HTN with chronic kidney disease: Home regimen includes hydralazine , torsemide, amlodipine , isosorbide .  All currently held, as needed hydralazine  available. Blood pressure elevated, 160/49. May consider restarting  some home medications.     LOS: 3 [employer], [address] [facility] @TODAY @12 :42 PM

## 2023-05-28 NOTE — Transfer of Care (Signed)
 Immediate Anesthesia Transfer of Care Note  Patient: Jennifer Carrillo  Procedure(s) Performed: EGD (ESOPHAGOGASTRODUODENOSCOPY) EGD, WITH ARGON PLASMA COAGULATION  Patient Location: Endoscopy Unit  Anesthesia Type:General  Level of Consciousness: drowsy  Airway & Oxygen Therapy: Patient Spontanous Breathing and Patient connected to nasal cannula oxygen  Post-op Assessment: Report given to RN and Post -op Vital signs reviewed and stable  Post vital signs: Reviewed and stable  Last Vitals:  Vitals Value Taken Time  BP 153/58 05/28/23 1315  Temp 35.9 C 05/28/23 1315  Pulse 67 05/28/23 1317  Resp 22 05/28/23 1317  SpO2 100 % 05/28/23 1317  Vitals shown include unfiled device data.  Last Pain:  Vitals:   05/28/23 1315  TempSrc: Temporal  PainSc: Asleep         Complications: There were no known notable events for this encounter.

## 2023-05-28 NOTE — Discharge Summary (Signed)
 Physician Discharge Summary   Patient: Jennifer Carrillo MRN: [id number] DOB: May 14, 1933  Admit date:     05/25/2023  Discharge date: 05/28/23  Discharge Physician: Donaciano Frizzle   PCP: Jacques Mattock, PA-C   Recommendations at discharge:   Follow-up with PCP in 1 week. Follow-up with nephrology for dialysis. Hold the Plavix  until seen by PCP, may resume at that time.  Discharge Diagnoses: Active Problems:   Benign essential HTN   End stage renal disease on dialysis due to type 2 diabetes mellitus (HCC)   GI bleed   Acute blood loss anemia   Hypokalemia   Acute upper GI bleed   Symptomatic anemia   AVM (arteriovenous malformation) of duodenum, acquired  Resolved Problems:   * No resolved hospital problems. *  Hospital Course: Jennifer Carrillo is a 89 y.o. female with medical history significant of ESRD on HD TTS, HTN, PVD s/p multiple stenting and femoral artery renal artery left carotid artery on Plavix , multivessel CAD with known RCA occlusion with collaterals, IDDM, chronic iron  deficiency anemia no longer on iron  supplement, COPD with chronic hypoxic respiratory failure on 2 L nightly, dementia, sent from dialysis center for evaluation of shortness of breath.  He also had intermittent black stool for a week. Upon arriving the hospital, hemoglobin was 6.7, received 2 units of transfusion. Patient hemoglobin has been stabilized, EGD was performed 5/20, showed some AVM, no longer has any bleeding.  At this point, patient is medically stable for discharge.  Assessment and Plan: Acute blood loss anemia secondary to GI bleed. Upper GI bleed probably secondary to gastric AVM. Patient has been having black stools, hemoglobin dropped down to 6.7.  Hemoglobin 8.5 after 2 units of blood transfusion.  Continued Protonix  twice a day. EGD showed gastric AVM, no additional bleeding.  Medically stable for discharge.  I will continue Protonix  once a day.  Hemoglobin today is 9.4 has  improved.   End-stage renal disease on dialysis. Hypokalemia. Minimal metabolic acidosis. Continue scheduled dialysis.   Essential hypertension. Blood pressure running high, resume home treatment.   Type 2 diabetes Resume home treatment.   Overweight with BMI 28.36. Diet and excise.   Dementia. Continue to follow.       Consultants: GI Procedures performed: EGD  Disposition: Home health Diet recommendation:  Discharge Diet Orders (From admission, onward)     Start     Ordered   05/28/23 0000  Diet general       Comments: Renal diet   05/28/23 1338           Renal diet DISCHARGE MEDICATION: Allergies as of 05/28/2023       Reactions   Benazepril Nausea Only        Medication List     STOP taking these medications    clopidogrel  75 MG tablet Commonly known as: PLAVIX    doxycycline  100 MG tablet Commonly known as: ADOXA   ergocalciferol  1.25 MG (50000 UT) capsule Commonly known as: VITAMIN D2   insulin  NPH-regular Human (70-30) 100 UNIT/ML injection   torsemide 100 MG tablet Commonly known as: DEMADEX   traZODone 50 MG tablet Commonly known as: DESYREL       TAKE these medications    Accu-Chek Guide Me w/Device Kit Use as instructed. DX e11.65   Accu-Chek Guide test strip Generic drug: glucose blood TEST BLOOD SUGAR THREE TIMES DAILY  AS  DIRECTED   Accu-Chek Softclix Lancets lancets USE AS INSTRUCTED 3 TIMES A DAY  acetaminophen  500 MG tablet Commonly known as: TYLENOL  Take 1 tablet (500 mg total) by mouth every 6 (six) hours as needed for moderate pain or headache.   albuterol  108 (90 Base) MCG/ACT inhaler Commonly known as: VENTOLIN  HFA Inhale 2 puffs into the lungs every 6 (six) hours as needed for wheezing or shortness of breath.   amLODipine  10 MG tablet Commonly known as: NORVASC  TAKE 1 TABLET EVERY DAY AT BREAKFAST   atorvastatin  40 MG tablet Commonly known as: LIPITOR Take 40 mg by mouth daily.   B-D SINGLE  USE SWABS REGULAR Pads Use as directed for 3 times daily DX E11.65   Breztri  Aerosphere 160-9-4.8 MCG/ACT Aero inhaler Generic drug: budesonide -glycopyrrolate -formoterol  Inhale 2 puffs into the lungs 2 (two) times daily.   docusate sodium  100 MG capsule Commonly known as: COLACE Take 200 mg by mouth at bedtime as needed for mild constipation.   donepezil  10 MG tablet Commonly known as: ARICEPT  TAKE 1 TABLET AT BEDTIME FOR MEMORY (NEED MD APPOINTMENT)   ferrous sulfate  325 (65 FE) MG tablet Take 1 tablet (325 mg total) by mouth daily with breakfast. Start taking on: May 29, 2023   hydrALAZINE  50 MG tablet Commonly known as: APRESOLINE  Take 100 mg by mouth in the morning and at bedtime. Take one tablet in AM and two tablets in PM What changed: Another medication with the same name was removed. Continue taking this medication, and follow the directions you see here.   isosorbide  mononitrate 60 MG 24 hr tablet Commonly known as: IMDUR  Take 60 mg by mouth daily.   pantoprazole  40 MG tablet Commonly known as: Protonix  Take 1 tablet (40 mg total) by mouth daily.        Follow-up Information     McDonough, Lillie Reining, PA-C Follow up in 1 week(s).   Specialty: Physician Assistant Contact information: [phone] [address] [place] [address] [phone]                Discharge Exam: Filed Weights   05/25/23 1031  Weight: 82.1 kg   General exam: Appears calm and comfortable  Respiratory system: Clear to auscultation. Respiratory effort normal. Cardiovascular system: S1 & S2 heard, RRR. No JVD, murmurs, rubs, gallops or clicks. No pedal edema. Gastrointestinal system: Abdomen is nondistended, soft and nontender. No organomegaly or masses felt. Normal bowel sounds heard. Central nervous system: Alert and oriented. No focal neurological deficits. Extremities: Symmetric 5 x 5 power. Skin: No rashes, lesions or ulcers Psychiatry: Judgement and insight appear normal. Mood &  affect appropriate.    Condition at discharge: good  The results of significant diagnostics from this hospitalization (including imaging, microbiology, ancillary and laboratory) are listed below for reference.   Imaging Studies: DG Chest Portable 1 View Result Date: 05/25/2023 CLINICAL DATA:  SOB EXAM: PORTABLE CHEST - 1 VIEW COMPARISON:  01/04/2022 and previous FINDINGS: Stable tunneled right IJ HD catheter to the cavoatrial junction. Mild patchy opacities peripherally at the right lung base, and left retrocardiac. Heart size and mediastinal contours are within normal limits. Aortic Atherosclerosis (ICD10-170.0). No effusion. Visualized bones unremarkable. IMPRESSION: Mild patchy bibasilar opacities. Electronically Signed   By: Nicoletta Barrier M.D.   On: 05/25/2023 10:40    Microbiology: Results for orders placed or performed during the hospital encounter of 01/04/22  Resp panel by RT-PCR (RSV, Flu A&B, Covid) Anterior Nasal Swab     Status: None   Collection Time: 01/04/22  1:56 PM   Specimen: Anterior Nasal Swab  Result Value  Ref Range Status   SARS Coronavirus 2 by RT PCR NEGATIVE NEGATIVE Final    Comment: (NOTE) SARS-CoV-2 target nucleic acids are NOT DETECTED.  The SARS-CoV-2 RNA is generally detectable in upper respiratory specimens during the acute phase of infection. The lowest concentration of SARS-CoV-2 viral copies this assay can detect is 138 copies/mL. A negative result does not preclude SARS-Cov-2 infection and should not be used as the sole basis for treatment or other patient management decisions. A negative result may occur with  improper specimen collection/handling, submission of specimen other than nasopharyngeal swab, presence of viral mutation(s) within the areas targeted by this assay, and inadequate number of viral copies(<138 copies/mL). A negative result must be combined with clinical observations, patient history, and epidemiological information. The expected  result is Negative.  Fact Sheet for Patients:  BloggerCourse.com  Fact Sheet for Healthcare Providers:  SeriousBroker.it  This test is no t yet approved or cleared by the [place]  FDA and  has been authorized for detection and/or diagnosis of SARS-CoV-2 by FDA under an Emergency Use Authorization (EUA). This EUA will remain  in effect (meaning this test can be used) for the duration of the COVID-19 declaration under Section 564(b)(1) of the Act, 21 U.S.C.section 360bbb-3(b)(1), unless the authorization is terminated  or revoked sooner.       Influenza A by PCR NEGATIVE NEGATIVE Final   Influenza B by PCR NEGATIVE NEGATIVE Final    Comment: (NOTE) The Xpert Xpress SARS-CoV-2/FLU/RSV plus assay is intended as an aid in the diagnosis of influenza from Nasopharyngeal swab specimens and should not be used as a sole basis for treatment. Nasal washings and aspirates are unacceptable for Xpert Xpress SARS-CoV-2/FLU/RSV testing.  Fact Sheet for Patients: BloggerCourse.com  Fact Sheet for Healthcare Providers: SeriousBroker.it  This test is not yet approved or cleared by the [place]  FDA and has been authorized for detection and/or diagnosis of SARS-CoV-2 by FDA under an Emergency Use Authorization (EUA). This EUA will remain in effect (meaning this test can be used) for the duration of the COVID-19 declaration under Section 564(b)(1) of the Act, 21 U.S.C. section 360bbb-3(b)(1), unless the authorization is terminated or revoked.     Resp Syncytial Virus by PCR NEGATIVE NEGATIVE Final    Comment: (NOTE) Fact Sheet for Patients: BloggerCourse.com  Fact Sheet for Healthcare Providers: SeriousBroker.it  This test is not yet approved or cleared by the [place]  FDA and has been authorized for detection and/or diagnosis of  SARS-CoV-2 by FDA under an Emergency Use Authorization (EUA). This EUA will remain in effect (meaning this test can be used) for the duration of the COVID-19 declaration under Section 564(b)(1) of the Act, 21 U.S.C. section 360bbb-3(b)(1), unless the authorization is terminated or revoked.  Performed at [facility], [address] [place], [place] [address]     Labs: CBC: Recent Labs  Lab 05/25/23 1024 05/25/23 1937 05/26/23 0427 05/26/23 1411 05/26/23 2247 05/27/23 0724 05/27/23 1437 05/28/23 0504  WBC 8.2  --  8.0  --   --   --   --  8.1  NEUTROABS 6.0  --   --   --   --   --   --   --   HGB 6.7* 6.9* 8.5* 9.3* 8.6* 9.8* 9.6* 9.4*  HCT 22.7* 22.1* 26.7*  --   --   --   --  30.0*  MCV 89.4  --  89.3  --   --   --   --  88.8  PLT 270  --  215  --   --   --   --  218   Basic Metabolic Panel: Recent Labs  Lab 05/25/23 1024 05/28/23 0504  NA 136 139  K 3.3* 4.1  CL 99 105  CO2 24 21*  GLUCOSE 134* 100*  BUN 28* 49*  CREATININE 3.22* 6.18*  CALCIUM  9.0 9.1   Liver Function Tests: Recent Labs  Lab 05/25/23 1024  AST 36  ALT 37  ALKPHOS 75  BILITOT 0.9  PROT 7.7  ALBUMIN 3.7   CBG: Recent Labs  Lab 05/26/23 2125 05/27/23 0742 05/27/23 1152 05/27/23 1713 05/28/23 1243  GLUCAP 121* 103* 118* 93 85    Discharge time spent: greater than 30 minutes.  Signed: Donaciano Frizzle, MD [facility] 05/28/2023

## 2023-05-28 NOTE — Op Note (Signed)
 [facility] Gastroenterology Patient Name: Jennifer Carrillo Procedure Date: 05/28/2023 12:48 PM MRN: [id number] Account #: [id number] Date of Birth: [birth year] Admit Type: Outpatient Age: 89 Room: [facility] ENDO ROOM 4 Gender: Female Note Status: Finalized Instrument Name: Cristino Donna Endoscope [id number] Procedure:             Upper GI endoscopy Indications:           Melena Providers:             Marnee Sink MD, MD Medicines:             Propofol  per Anesthesia Complications:         No immediate complications. Procedure:             Pre-Anesthesia Assessment:                        - Prior to the procedure, a History and Physical was                         performed, and patient medications and allergies were                         reviewed. The patient's tolerance of previous                         anesthesia was also reviewed. The risks and benefits                         of the procedure and the sedation options and risks                         were discussed with the patient. All questions were                         answered, and informed consent was obtained. Prior                         Anticoagulants: The patient has taken no anticoagulant                         or antiplatelet agents. ASA Grade Assessment: II - A                         patient with mild systemic disease. After reviewing                         the risks and benefits, the patient was deemed in                         satisfactory condition to undergo the procedure.                        After obtaining informed consent, the endoscope was                         passed under direct vision. Throughout the procedure,                         the patient's blood pressure,  pulse, and oxygen                         saturations were monitored continuously. The Endoscope                         was introduced through the mouth, and advanced to the                         third part of duodenum. The  upper GI endoscopy was                         accomplished without difficulty. The patient tolerated                         the procedure well. Findings:      The examined esophagus was normal.      A single angiodysplastic lesion with no bleeding was found in the       gastric body. Coagulation for tissue destruction using argon plasma at 2       liters/minute and 20 watts was successful.      Multiple small angiodysplastic lesions without bleeding were found in       the duodenal bulb. Coagulation for tissue destruction using argon plasma       at 2 liters/minute and 20 watts was successful. Impression:            - Normal esophagus.                        - A single non-bleeding angiodysplastic lesion in the                         stomach. Treated with argon plasma coagulation (APC).                        - Multiple non-bleeding angiodysplastic lesions in the                         duodenum. Treated with argon plasma coagulation (APC).                        - No specimens collected. Recommendation:        - Return patient to hospital ward for ongoing care.                        - Resume previous diet.                        - Continue present medications. Procedure Code(s):     --- Professional ---                        [phone], Esophagogastroduodenoscopy, flexible,                         transoral; with ablation of tumor(s), polyp(s), or                         other lesion(s) (includes pre- and post-dilation and  guide wire passage, when performed) Diagnosis Code(s):     --- Professional ---                        K92.1, Melena (includes Hematochezia)                        K31.819, Angiodysplasia of stomach and duodenum                         without bleeding CPT copyright 2022 American Medical Association. All rights reserved. The codes documented in this report are preliminary and upon coder review may  be revised to meet current compliance  requirements. Marnee Sink MD, MD 05/28/2023 1:16:37 PM This report has been signed electronically. Number of Addenda: 0 Note Initiated On: 05/28/2023 12:48 PM Estimated Blood Loss:  Estimated blood loss: none.      [facility]

## 2023-05-28 NOTE — Anesthesia Postprocedure Evaluation (Signed)
 Anesthesia Post Note  Patient: Jennifer Carrillo  Procedure(s) Performed: EGD (ESOPHAGOGASTRODUODENOSCOPY) EGD, WITH ARGON PLASMA COAGULATION  Patient location during evaluation: Endoscopy Anesthesia Type: General Level of consciousness: awake and alert Pain management: pain level controlled Vital Signs Assessment: post-procedure vital signs reviewed and stable Respiratory status: spontaneous breathing, nonlabored ventilation, respiratory function stable and patient connected to nasal cannula oxygen Cardiovascular status: blood pressure returned to baseline and stable Postop Assessment: no apparent nausea or vomiting Anesthetic complications: no  There were no known notable events for this encounter.   Last Vitals:  Vitals:   05/28/23 1325 05/28/23 1335  BP: (!) 157/58 (!) 162/83  Pulse: 63 63  Resp: (!) 23 (!) 28  Temp:    SpO2: 100% 100%    Last Pain:  Vitals:   05/28/23 1335  TempSrc:   PainSc: 0-No pain                 Enrique Harvest

## 2023-05-28 NOTE — Anesthesia Preprocedure Evaluation (Signed)
 Anesthesia Evaluation  Patient identified by MRN, date of birth, ID band Patient confused    Reviewed: Allergy & Precautions, NPO status , Patient's Chart, lab work & pertinent test results  Airway Mallampati: III  TM Distance: >3 FB Neck ROM: full    Dental  (+) Chipped, Dental Advidsory Given   Pulmonary sleep apnea , COPD,  COPD inhaler, former smoker   Pulmonary exam normal        Cardiovascular hypertension, + CAD, + Past MI and + Peripheral Vascular Disease  Normal cardiovascular exam     Neuro/Psych CVA, Residual Symptoms  negative psych ROS   GI/Hepatic Neg liver ROS,GERD  ,,  Endo/Other  negative endocrine ROSdiabetes    Renal/GU ESRF and DialysisRenal disease  negative genitourinary   Musculoskeletal   Abdominal   Peds  Hematology  (+) Blood dyscrasia, anemia   Anesthesia Other Findings Past Medical History: No date: Arthritis No date: Asthma No date: Calf pain 2016: Cancer (HCC)     Comment:  left breast No date: COPD (chronic obstructive pulmonary disease) (HCC) No date: Diabetes (HCC) No date: Discoid lupus No date: Gastro-esophageal reflux No date: Glaucoma No date: Hyperlipemia No date: Hypertension No date: Iron  deficiency anemia No date: Joint pain No date: Leg swelling No date: Osteoarthritis No date: PVD (peripheral vascular disease) (HCC) No date: Sinus problem No date: Sleep apnea     Comment:  No CPAP/ Can't tolerate 1987: Stroke (HCC)  Past Surgical History: No date: BREAST SURGERY; Left     Comment:  left lumpectomy 06/25/2018: CAROTID ANGIOGRAPHY; Left     Comment:  Procedure: CAROTID ANGIOGRAPHY, possible intervention;                Surgeon: Jackquelyn Mass, MD;  Location: [facility] INVASIVE              CV LAB;  Service: Cardiovascular;  Laterality: Left; No date: CAROTID ENDARTERECTOMY; Right 06/25/2018: CAROTID PTA/STENT INTERVENTION; Left     Comment:  Procedure: CAROTID  PTA/STENT INTERVENTION;  Surgeon:               Jackquelyn Mass, MD;  Location: [facility] INVASIVE CV LAB;               Service: Cardiovascular;  Laterality: Left; 2013: CATARACT EXTRACTION; Right No date: CORONARY STENT PLACEMENT     Comment:  L. L. E. 12/29/2020: DIALYSIS/PERMA CATHETER INSERTION; N/A     Comment:  Procedure: DIALYSIS/PERMA CATHETER INSERTION;  Surgeon:               Celso College, MD;  Location: [facility] INVASIVE CV LAB;                Service: Cardiovascular;  Laterality: N/A; 12/14/2021: DIALYSIS/PERMA CATHETER INSERTION; N/A     Comment:  Procedure: DIALYSIS/PERMA CATHETER INSERTION;  Surgeon:               Jackquelyn Mass, MD;  Location: [facility] INVASIVE CV LAB;               Service: Cardiovascular;  Laterality: N/A; No date: EYE SURGERY; Bilateral     Comment:  cataract 05/06/2018: LEFT HEART CATH AND CORONARY ANGIOGRAPHY; Left     Comment:  Procedure: LEFT HEART CATH AND CORONARY ANGIOGRAPHY;                Surgeon: Michelle Aid, MD;  Location[facility] INVASIVE  CV LAB;  Service: Cardiovascular;  Laterality: Left; 01/22/2018: LOWER EXTREMITY ANGIOGRAPHY; Right     Comment:  Procedure: LOWER EXTREMITY ANGIOGRAPHY;  Surgeon:               Jackquelyn Mass, MD;  Location: [facility] INVASIVE CV LAB;               Service: Cardiovascular;  Laterality: Right; 02/26/2018: RENAL ANGIOGRAPHY; Left     Comment:  Procedure: RENAL ANGIOGRAPHY;  Surgeon: Jackquelyn Mass, MD;  Location: [facility] INVASIVE CV LAB;  Service:               Cardiovascular;  Laterality: Left; No date: STENT PLACEMENT VASCULAR ([facility] HX); Left     Comment:  stent placement on LLE No date: TUBAL LIGATION  BMI    Body Mass Index: 28.36 kg/m      Reproductive/Obstetrics negative OB ROS                             Anesthesia Physical Anesthesia Plan  ASA: 3  Anesthesia Plan: General   Post-op Pain Management: Minimal or no pain anticipated    Induction: Intravenous  PONV Risk Score and Plan: 3 and Propofol  infusion, TIVA and Ondansetron   Airway Management Planned: Nasal Cannula  Additional Equipment: None  Intra-op Plan:   Post-operative Plan:   Informed Consent: I have reviewed the patients History and Physical, chart, labs and discussed the procedure including the risks, benefits and alternatives for the proposed anesthesia with the patient or authorized representative who has indicated his/her understanding and acceptance.     Dental advisory given and Consent reviewed with POA  Plan Discussed with: CRNA and Surgeon  Anesthesia Plan Comments: (Discussed risks of anesthesia with patient and husband, including possibility of difficulty with spontaneous ventilation under anesthesia necessitating airway intervention, PONV, and rare risks such as cardiac or respiratory or neurological events, and allergic reactions. Discussed the role of CRNA in patient's perioperative care. Patient's husband understands and consents.)       Anesthesia Quick Evaluation

## 2023-05-28 NOTE — TOC Transition Note (Signed)
 Transition of Care [facility]) - Discharge Note   Patient Details  Name: Jennifer Carrillo MRN: [id number] Date of Birth: 1933-07-20  Transition of Care [facility]) CM/SW Contact:  Odilia Bennett, LCSW Phone Number: 05/28/2023, 2:51 PM   Clinical Narrative: Patient has orders to discharge home today. MD entered home health orders. Patient and husband are agreeable. No agency preference. [facility] has accepted for PT, OT, RN, aide. No further concerns. Husband will transport her home. CSW signing off.    Final next level of care: Home w Home Health Services Barriers to Discharge: Barriers Resolved   Patient Goals and CMS Choice   CMS Medicare.gov Compare Post Acute Care list provided to:: Patient Represenative (must comment) (Husband) Choice offered to / list presented to : Patient      Discharge Placement                Patient to be transferred to facility by: Husband Name of family member notified: Hatsumi Steinhart Patient and family notified of of transfer: 05/28/23  Discharge Plan and Services Additional resources added to the After Visit Summary for                            [facility] Arranged: RN, PT, OT, Nurse's Aide HH Agency: [facility] Date HH Agency Contacted: 05/28/23   Representative spoke with at [facility] Agency: Shaun  Social Drivers of Health (SDOH) Interventions SDOH Screenings   Food Insecurity: No Food Insecurity (05/26/2023)  Housing: Low Risk  (05/26/2023)  Transportation Needs: No Transportation Needs (05/26/2023)  Utilities: Not At Risk (05/26/2023)  Alcohol Screen: Low Risk  (06/11/2018)  Depression (PHQ2-9): Medium Risk (08/10/2022)  Financial Resource Strain: Low Risk  (06/23/2020)  Social Connections: Moderately Isolated (05/26/2023)  Tobacco Use: Medium Risk (05/28/2023)  Health Literacy: Low Risk  (04/17/2020)   Received from [facility]     Readmission Risk Interventions    12/19/2020    1:33 PM   Readmission Risk Prevention Plan  Transportation Screening Complete  Medication Review (RN Care Manager) Complete  PCP or Specialist appointment within 3-5 days of discharge Complete  HRI or Home Care Consult Complete  SW Recovery Care/Counseling Consult Complete  Palliative Care Screening Not Applicable  Skilled Nursing Facility Complete

## 2023-05-29 ENCOUNTER — Encounter: Payer: Self-pay | Admitting: Gastroenterology

## 2023-05-29 DIAGNOSIS — R269 Unspecified abnormalities of gait and mobility: Secondary | ICD-10-CM | POA: Diagnosis not present

## 2023-05-29 LAB — MISC LABCORP TEST (SEND OUT): Labcorp test code: 6510

## 2023-05-30 ENCOUNTER — Telehealth: Payer: Self-pay

## 2023-05-30 ENCOUNTER — Encounter: Payer: Self-pay | Admitting: Internal Medicine

## 2023-05-30 DIAGNOSIS — N186 End stage renal disease: Secondary | ICD-10-CM | POA: Diagnosis not present

## 2023-05-30 DIAGNOSIS — Z992 Dependence on renal dialysis: Secondary | ICD-10-CM | POA: Diagnosis not present

## 2023-05-30 DIAGNOSIS — D631 Anemia in chronic kidney disease: Secondary | ICD-10-CM | POA: Diagnosis not present

## 2023-05-30 LAB — HEPATITIS B SURFACE ANTIBODY, QUANTITATIVE: Hep B S AB Quant (Post): <3.5 m[IU]/mL — ABNORMAL LOW

## 2023-05-31 ENCOUNTER — Telehealth: Payer: Self-pay

## 2023-05-31 ENCOUNTER — Ambulatory Visit: Admitting: Nurse Practitioner

## 2023-05-31 ENCOUNTER — Encounter: Payer: Self-pay | Admitting: Nurse Practitioner

## 2023-05-31 VITALS — BP 154/51 | HR 89 | Temp 98.2°F | Resp 16 | Ht 67.0 in | Wt 180.0 lb

## 2023-05-31 DIAGNOSIS — J449 Chronic obstructive pulmonary disease, unspecified: Secondary | ICD-10-CM

## 2023-05-31 DIAGNOSIS — Z09 Encounter for follow-up examination after completed treatment for conditions other than malignant neoplasm: Secondary | ICD-10-CM

## 2023-05-31 DIAGNOSIS — Z9981 Dependence on supplemental oxygen: Secondary | ICD-10-CM | POA: Diagnosis not present

## 2023-05-31 DIAGNOSIS — I70203 Unspecified atherosclerosis of native arteries of extremities, bilateral legs: Secondary | ICD-10-CM | POA: Diagnosis not present

## 2023-05-31 DIAGNOSIS — K31819 Angiodysplasia of stomach and duodenum without bleeding: Secondary | ICD-10-CM

## 2023-05-31 MED ORDER — CLOPIDOGREL BISULFATE 75 MG PO TABS: ORAL_TABLET | ORAL | 1 refills | Status: DC

## 2023-05-31 NOTE — Progress Notes (Signed)
 [facility] [place], [place] [address] [place] [address] [phone]                                   [facility]   [facility] Discharge Acute Issues Care Follow Up                                                                        Patient Demographics  Jennifer Carrillo, is a 89 y.o. female  DOB Apr 16, 1933  MRN [id number].  Primary MD  Jennifer Mattock, PA-C  Admit date: 05/25/2023 Discharge date: 05/28/2023  Reason for TCC follow Up - GI bleed due to AVM   Past Medical History:  Diagnosis Date   Arthritis    Asthma    Calf pain    Cancer (HCC) 2016   left breast   COPD (chronic obstructive pulmonary disease) (HCC)    Diabetes (HCC)    Discoid lupus    Gastro-esophageal reflux    Glaucoma    Hyperlipemia    Hypertension    Iron  deficiency anemia    Joint pain    Leg swelling    Osteoarthritis    PVD (peripheral vascular disease) (HCC)    Sinus problem    Sleep apnea    No CPAP/ Can't tolerate   Stroke (HCC) 1987    Past Surgical History:  Procedure Laterality Date   BREAST SURGERY Left    left lumpectomy   CAROTID ANGIOGRAPHY Left 06/25/2018   Procedure: CAROTID ANGIOGRAPHY, possible intervention;  Surgeon: Jackquelyn Mass, MD;  Location: [facility];  Service: Cardiovascular;  Laterality: Left;   CAROTID ENDARTERECTOMY Right    CAROTID PTA/STENT INTERVENTION Left 06/25/2018   Procedure: CAROTID PTA/STENT INTERVENTION;  Surgeon: Jackquelyn Mass, MD;  Location: [facility];  Service: Cardiovascular;  Laterality: Left;   CATARACT EXTRACTION Right 2013   CORONARY STENT PLACEMENT     L. L. E.   DIALYSIS/PERMA CATHETER INSERTION N/A 12/29/2020   Procedure: DIALYSIS/PERMA CATHETER INSERTION;  Surgeon: Celso College, MD;  Location: [facility];  Service: Cardiovascular;  Laterality: N/A;   DIALYSIS/PERMA CATHETER INSERTION N/A 12/14/2021   Procedure: DIALYSIS/PERMA CATHETER  INSERTION;  Surgeon: Jackquelyn Mass, MD;  Location: [facility];  Service: Cardiovascular;  Laterality: N/A;   ESOPHAGOGASTRODUODENOSCOPY N/A 05/28/2023   Procedure: EGD (ESOPHAGOGASTRODUODENOSCOPY);  Surgeon: Marnee Sink, MD;  Location[facility];  Service: Endoscopy;  Laterality: N/A;   EYE SURGERY Bilateral    cataract   HOT HEMOSTASIS  05/28/2023   Procedure: EGD, WITH ARGON PLASMA COAGULATION;  Surgeon: Marnee Sink, MD;  Location: [facility] ENDOS[facility];  Service: Endoscopy;;   LEFT HEART CATH AND CORONARY ANGIOGRAPHY Left 05/06/2018   Procedure: LEFT HEART CATH AND CORONARY ANGIOGRAPHY;  Surgeon: Michelle Aid, MD;  Location: [facility];  Service: Cardiovascular;  Laterality: Left;   LOWER EXTREMITY ANGIOGRAPHY Right 01/22/2018   Procedure: LOWER EXTREMITY ANGIOGRAPHY;  Surgeon: Jackquelyn Mass, MD;  Location: [facility];  Service: Cardiovascular;  Laterality: Right;   RENAL ANGIOGRAPHY Left 02/26/2018   Procedure: RENAL ANGIOGRAPHY;  Surgeon:  Schnier, Ninette Basque, MD;  Location: [facility];  Service: Cardiovascular;  Laterality: Left;   STENT PLACEMENT VASCULAR ([facility] HX) Left    stent placement on LLE   TUBAL LIGATION         Recent HPI and Hospital Course  Hospital Course: Jennifer Carrillo is a 89 y.o. female with medical history significant of ESRD on HD TTS, HTN, PVD s/p multiple stenting and femoral artery renal artery left carotid artery on Plavix , multivessel CAD with known RCA occlusion with collaterals, IDDM, chronic iron  deficiency anemia no longer on iron  supplement, COPD with chronic hypoxic respiratory failure on 2 L nightly, dementia, sent from dialysis center for evaluation of shortness of breath.  He also had intermittent black stool for a week. Upon arriving the hospital, hemoglobin was 6.7, received 2 units of transfusion. Patient hemoglobin has been stabilized, EGD was performed 5/20, showed some AVM, no longer has any  bleeding.  At this point, patient is medically stable for discharge.   Assessment and Plan: Acute blood loss anemia secondary to GI bleed. Upper GI bleed probably secondary to gastric AVM. Patient has been having black stools, hemoglobin dropped down to 6.7.  Hemoglobin 8.5 after 2 units of blood transfusion.  Continued Protonix  twice a day. EGD showed gastric AVM, no additional bleeding.  Medically stable for discharge.  I will continue Protonix  once a day.  Hemoglobin today is 9.4 has improved.   End-stage renal disease on dialysis. Hypokalemia. Minimal metabolic acidosis. Continue scheduled dialysis.   Essential hypertension. Blood pressure running high, resume home treatment.   Type 2 diabetes Resume home treatment.   Overweight with BMI 28.36. Diet and excise.   Dementia. Continue to follow.      Post Hospital Acute Care Issue to be followed in the Clinic   Active Problems:   Benign essential HTN   End stage renal disease on dialysis due to type 2 diabetes mellitus (HCC)   GI bleed   Acute blood loss anemia   Hypokalemia   Acute upper GI bleed   Symptomatic anemia   AVM (arteriovenous malformation) of duodenum, acquired   Subjective:   Jennifer Carrillo today has, No headache, No chest pain, No abdominal pain - No Nausea, No new weakness tingling or numbness, No Cough - SOB.   Assessment & Plan   1. Hospital discharge follow-up (Primary) Treated for GI bleed   2. AVM (arteriovenous malformation) of duodenum, acquired Treated while in the hospital via endoscopy   3. Bilateral femoral artery stenosis (HCC) Continue plavix  as prescribed.  - clopidogrel  (PLAVIX ) 75 MG tablet; TAKE 1 TABLET DAILY AT DINNER  Dispense: 90 tablet; Refill: 1  4. Chronic obstructive pulmonary disease, unspecified COPD type (HCC) Continue breztri  inhaler as prescribed.   5. Oxygen dependent Continue using supplemental oxygen as instructed.     Reason for frequent admissions/ER  visits    GI bleed COPD Diabetes ESRD    Objective:   Vitals:   05/31/23 1046  BP: (!) 154/51  Pulse: 89  Resp: 16  Temp: 98.2 F (36.8 C)  SpO2: 100%  Weight: 180 lb (81.6 kg)  Height: 5' 7 (1.702 m)    Wt Readings from Last 3 Encounters:  05/31/23 180 lb (81.6 kg)  05/28/23 166 lb 14.2 oz (75.7 kg)  11/19/22 181 lb (82.1 kg)    Allergies as of 05/31/2023       Reactions   Benazepril Nausea Only        Medication  List        Accurate as of May 31, 2023 11:59 PM. If you have any questions, ask your nurse or doctor.          Accu-Chek Guide Me w/Device Kit Use as instructed. DX e11.65   Accu-Chek Guide test strip Generic drug: glucose blood TEST BLOOD SUGAR THREE TIMES DAILY  AS  DIRECTED   Accu-Chek Softclix Lancets lancets USE AS INSTRUCTED 3 TIMES A DAY   acetaminophen  500 MG tablet Commonly known as: TYLENOL  Take 1 tablet (500 mg total) by mouth every 6 (six) hours as needed for moderate pain or headache.   albuterol  108 (90 Base) MCG/ACT inhaler Commonly known as: VENTOLIN  HFA Inhale 2 puffs into the lungs every 6 (six) hours as needed for wheezing or shortness of breath.   amLODipine  10 MG tablet Commonly known as: NORVASC  TAKE 1 TABLET EVERY DAY AT BREAKFAST   atorvastatin  40 MG tablet Commonly known as: LIPITOR Take 40 mg by mouth daily.   B-D SINGLE USE SWABS REGULAR Pads Use as directed for 3 times daily DX E11.65   Breztri  Aerosphere 160-9-4.8 MCG/ACT Aero inhaler Generic drug: budesonide -glycopyrrolate -formoterol  Inhale 2 puffs into the lungs 2 (two) times daily.   clopidogrel  75 MG tablet Commonly known as: PLAVIX  TAKE 1 TABLET DAILY AT DINNER Started by: Nuri Larmer   docusate sodium  100 MG capsule Commonly known as: COLACE Take 200 mg by mouth at bedtime as needed for mild constipation.   donepezil  10 MG tablet Commonly known as: ARICEPT  TAKE 1 TABLET AT BEDTIME FOR MEMORY (NEED MD APPOINTMENT)   ferrous  sulfate 325 (65 FE) MG tablet Take 1 tablet (325 mg total) by mouth daily with breakfast.   hydrALAZINE  50 MG tablet Commonly known as: APRESOLINE  Take 100 mg by mouth in the morning and at bedtime. Take one tablet in AM and two tablets in PM   isosorbide  mononitrate 60 MG 24 hr tablet Commonly known as: IMDUR  Take 60 mg by mouth daily.   pantoprazole  40 MG tablet Commonly known as: Protonix  Take 1 tablet (40 mg total) by mouth daily.         Physical Exam: Constitutional: Patient appears well-developed and well-nourished. Not in obvious distress. HENT: Normocephalic, atraumatic, External right and left ear normal. Oropharynx is clear and moist.  Eyes: Conjunctivae and EOM are normal. PERRLA, no scleral icterus. Neck: Normal ROM. Neck supple. No JVD. No tracheal deviation. No thyromegaly. CVS: RRR, S1/S2 +, no murmurs, no gallops, no carotid bruit.  Pulmonary: Effort and breath sounds normal, no stridor, rhonchi, wheezes, rales.  Abdominal: Soft. BS +, no distension, tenderness, rebound or guarding.  Musculoskeletal: Normal range of motion. No edema and no tenderness.  Lymphadenopathy: No lymphadenopathy noted, cervical, inguinal or axillary Neuro: Alert. Normal reflexes, muscle tone coordination. No cranial nerve deficit. Skin: Skin is warm and dry. No rash noted. Not diaphoretic. No erythema. No pallor. Psychiatric: Normal mood and affect. Behavior, judgment, thought content normal.   Data Review   Micro Results No results found for this or any previous visit (from the past 240 hours).   CBC No results for input(s): WBC, HGB, HCT, PLT, MCV, MCH, MCHC, RDW, LYMPHSABS, MONOABS, EOSABS, BASOSABS, BANDABS in the last 168 hours.  Invalid input(s): NEUTRABS, BANDSABD   Chemistries  No results for input(s): NA, K, CL, CO2, GLUCOSE, BUN, CREATININE, CALCIUM , MG, AST, ALT, ALKPHOS, BILITOT in the last 168  hours.  Invalid input(s): GFRCGP  ------------------------------------------------------------------------------------------------------------------ CrCl cannot be calculated (Patient's most  recent lab result is older than the maximum 21 days allowed.). ------------------------------------------------------------------------------------------------------------------ No results for input(s): HGBA1C in the last 72 hours. ------------------------------------------------------------------------------------------------------------------ No results for input(s): CHOL, HDL, LDLCALC, TRIG, CHOLHDL, LDLDIRECT in the last 72 hours. ------------------------------------------------------------------------------------------------------------------ No results for input(s): TSH, T4TOTAL, T3FREE, THYROIDAB in the last 72 hours.  Invalid input(s): FREET3 ------------------------------------------------------------------------------------------------------------------ No results for input(s): VITAMINB12, FOLATE, FERRITIN, TIBC, IRON , RETICCTPCT in the last 72 hours.  Coagulation profile No results for input(s): INR, PROTIME in the last 168 hours.   No results for input(s): DDIMER in the last 72 hours.  Cardiac Enzymes No results for input(s): CKMB, TROPONINI, MYOGLOBIN in the last 168 hours.  Invalid input(s): CK ------------------------------------------------------------------------------------------------------------------ Invalid input(s): POCBNP  Return for keep regular scheduled follow up with lauren PA-C.   Time Spent in minutes  45 Time spent with patient included reviewing progress notes, labs, imaging studies, and discussing plan for follow up.   This patient was seen by Laurence Pons, FNP-C in collaboration with Dr. Verneta Gone as a part of collaborative care agreement.    Laurence Pons MSN, FNP-C on 05/31/2023 at 11:14  AM   **Disclaimer: This note may have been dictated with voice recognition software. Similar sounding words can inadvertently be transcribed and this note may contain transcription errors which may not have been corrected upon publication of note.**

## 2023-05-31 NOTE — Telephone Encounter (Signed)
 Patient's husband called to report that patient was in the hospital and had questions about continuing Plavix . Per Barbra Ley, scheduled patient for a hospital follow-up to discuss.

## 2023-05-31 NOTE — Telephone Encounter (Signed)
 Patient being seen in office today.

## 2023-06-01 DIAGNOSIS — N186 End stage renal disease: Secondary | ICD-10-CM | POA: Diagnosis not present

## 2023-06-01 DIAGNOSIS — Z992 Dependence on renal dialysis: Secondary | ICD-10-CM | POA: Diagnosis not present

## 2023-06-04 ENCOUNTER — Inpatient Hospital Stay: Admitting: Internal Medicine

## 2023-06-04 DIAGNOSIS — Z992 Dependence on renal dialysis: Secondary | ICD-10-CM | POA: Diagnosis not present

## 2023-06-04 DIAGNOSIS — D631 Anemia in chronic kidney disease: Secondary | ICD-10-CM | POA: Diagnosis not present

## 2023-06-04 DIAGNOSIS — N186 End stage renal disease: Secondary | ICD-10-CM | POA: Diagnosis not present

## 2023-06-06 ENCOUNTER — Telehealth: Payer: Self-pay

## 2023-06-06 DIAGNOSIS — Z992 Dependence on renal dialysis: Secondary | ICD-10-CM | POA: Diagnosis not present

## 2023-06-06 DIAGNOSIS — N186 End stage renal disease: Secondary | ICD-10-CM | POA: Diagnosis not present

## 2023-06-06 DIAGNOSIS — N250 Renal osteodystrophy: Secondary | ICD-10-CM | POA: Diagnosis not present

## 2023-06-06 NOTE — Telephone Encounter (Signed)
 Patient assistance was approved for Breztri  with [employer].

## 2023-06-08 DIAGNOSIS — Z992 Dependence on renal dialysis: Secondary | ICD-10-CM | POA: Diagnosis not present

## 2023-06-08 DIAGNOSIS — N186 End stage renal disease: Secondary | ICD-10-CM | POA: Diagnosis not present

## 2023-06-11 DIAGNOSIS — N186 End stage renal disease: Secondary | ICD-10-CM | POA: Diagnosis not present

## 2023-06-11 DIAGNOSIS — Z992 Dependence on renal dialysis: Secondary | ICD-10-CM | POA: Diagnosis not present

## 2023-06-13 DIAGNOSIS — N186 End stage renal disease: Secondary | ICD-10-CM | POA: Diagnosis not present

## 2023-06-13 DIAGNOSIS — Z992 Dependence on renal dialysis: Secondary | ICD-10-CM | POA: Diagnosis not present

## 2023-06-15 DIAGNOSIS — N186 End stage renal disease: Secondary | ICD-10-CM | POA: Diagnosis not present

## 2023-06-15 DIAGNOSIS — Z992 Dependence on renal dialysis: Secondary | ICD-10-CM | POA: Diagnosis not present

## 2023-06-18 DIAGNOSIS — N250 Renal osteodystrophy: Secondary | ICD-10-CM | POA: Diagnosis not present

## 2023-06-18 DIAGNOSIS — Z992 Dependence on renal dialysis: Secondary | ICD-10-CM | POA: Diagnosis not present

## 2023-06-18 DIAGNOSIS — N186 End stage renal disease: Secondary | ICD-10-CM | POA: Diagnosis not present

## 2023-06-20 ENCOUNTER — Ambulatory Visit: Payer: Medicare Other | Admitting: Physician Assistant

## 2023-06-20 DIAGNOSIS — Z992 Dependence on renal dialysis: Secondary | ICD-10-CM | POA: Diagnosis not present

## 2023-06-20 DIAGNOSIS — N186 End stage renal disease: Secondary | ICD-10-CM | POA: Diagnosis not present

## 2023-06-22 DIAGNOSIS — N186 End stage renal disease: Secondary | ICD-10-CM | POA: Diagnosis not present

## 2023-06-22 DIAGNOSIS — Z992 Dependence on renal dialysis: Secondary | ICD-10-CM | POA: Diagnosis not present

## 2023-06-25 DIAGNOSIS — Z992 Dependence on renal dialysis: Secondary | ICD-10-CM | POA: Diagnosis not present

## 2023-06-25 DIAGNOSIS — N186 End stage renal disease: Secondary | ICD-10-CM | POA: Diagnosis not present

## 2023-06-26 ENCOUNTER — Encounter: Payer: Self-pay | Admitting: Nurse Practitioner

## 2023-06-27 DIAGNOSIS — Z992 Dependence on renal dialysis: Secondary | ICD-10-CM | POA: Diagnosis not present

## 2023-06-27 DIAGNOSIS — N186 End stage renal disease: Secondary | ICD-10-CM | POA: Diagnosis not present

## 2023-06-29 DIAGNOSIS — N186 End stage renal disease: Secondary | ICD-10-CM | POA: Diagnosis not present

## 2023-06-29 DIAGNOSIS — Z992 Dependence on renal dialysis: Secondary | ICD-10-CM | POA: Diagnosis not present

## 2023-06-29 DIAGNOSIS — R269 Unspecified abnormalities of gait and mobility: Secondary | ICD-10-CM | POA: Diagnosis not present

## 2023-07-02 DIAGNOSIS — Z992 Dependence on renal dialysis: Secondary | ICD-10-CM | POA: Diagnosis not present

## 2023-07-02 DIAGNOSIS — Z794 Long term (current) use of insulin: Secondary | ICD-10-CM | POA: Diagnosis not present

## 2023-07-02 DIAGNOSIS — N186 End stage renal disease: Secondary | ICD-10-CM | POA: Diagnosis not present

## 2023-07-02 DIAGNOSIS — D509 Iron deficiency anemia, unspecified: Secondary | ICD-10-CM | POA: Diagnosis not present

## 2023-07-02 DIAGNOSIS — E119 Type 2 diabetes mellitus without complications: Secondary | ICD-10-CM | POA: Diagnosis not present

## 2023-07-04 DIAGNOSIS — N186 End stage renal disease: Secondary | ICD-10-CM | POA: Diagnosis not present

## 2023-07-04 DIAGNOSIS — Z992 Dependence on renal dialysis: Secondary | ICD-10-CM | POA: Diagnosis not present

## 2023-07-06 DIAGNOSIS — N186 End stage renal disease: Secondary | ICD-10-CM | POA: Diagnosis not present

## 2023-07-06 DIAGNOSIS — Z992 Dependence on renal dialysis: Secondary | ICD-10-CM | POA: Diagnosis not present

## 2023-07-08 DIAGNOSIS — Z992 Dependence on renal dialysis: Secondary | ICD-10-CM | POA: Diagnosis not present

## 2023-07-08 DIAGNOSIS — N186 End stage renal disease: Secondary | ICD-10-CM | POA: Diagnosis not present

## 2023-07-09 DIAGNOSIS — N186 End stage renal disease: Secondary | ICD-10-CM | POA: Diagnosis not present

## 2023-07-09 DIAGNOSIS — D631 Anemia in chronic kidney disease: Secondary | ICD-10-CM | POA: Diagnosis not present

## 2023-07-09 DIAGNOSIS — Z992 Dependence on renal dialysis: Secondary | ICD-10-CM | POA: Diagnosis not present

## 2023-07-11 DIAGNOSIS — N186 End stage renal disease: Secondary | ICD-10-CM | POA: Diagnosis not present

## 2023-07-11 DIAGNOSIS — Z992 Dependence on renal dialysis: Secondary | ICD-10-CM | POA: Diagnosis not present

## 2023-07-13 DIAGNOSIS — Z992 Dependence on renal dialysis: Secondary | ICD-10-CM | POA: Diagnosis not present

## 2023-07-13 DIAGNOSIS — N186 End stage renal disease: Secondary | ICD-10-CM | POA: Diagnosis not present

## 2023-07-15 ENCOUNTER — Ambulatory Visit: Admitting: Physician Assistant

## 2023-07-16 DIAGNOSIS — Z992 Dependence on renal dialysis: Secondary | ICD-10-CM | POA: Diagnosis not present

## 2023-07-16 DIAGNOSIS — N186 End stage renal disease: Secondary | ICD-10-CM | POA: Diagnosis not present

## 2023-07-18 ENCOUNTER — Other Ambulatory Visit: Payer: Self-pay | Admitting: *Deleted

## 2023-07-18 DIAGNOSIS — D631 Anemia in chronic kidney disease: Secondary | ICD-10-CM

## 2023-07-18 DIAGNOSIS — N186 End stage renal disease: Secondary | ICD-10-CM | POA: Diagnosis not present

## 2023-07-18 DIAGNOSIS — Z992 Dependence on renal dialysis: Secondary | ICD-10-CM | POA: Diagnosis not present

## 2023-07-18 DIAGNOSIS — D509 Iron deficiency anemia, unspecified: Secondary | ICD-10-CM

## 2023-07-19 ENCOUNTER — Encounter: Payer: Self-pay | Admitting: Nurse Practitioner

## 2023-07-19 ENCOUNTER — Other Ambulatory Visit: Payer: Self-pay | Admitting: *Deleted

## 2023-07-19 ENCOUNTER — Ambulatory Visit (INDEPENDENT_AMBULATORY_CARE_PROVIDER_SITE_OTHER): Admitting: Physician Assistant

## 2023-07-19 ENCOUNTER — Inpatient Hospital Stay

## 2023-07-19 ENCOUNTER — Encounter: Payer: Self-pay | Admitting: Physician Assistant

## 2023-07-19 ENCOUNTER — Other Ambulatory Visit: Payer: Self-pay

## 2023-07-19 ENCOUNTER — Inpatient Hospital Stay (HOSPITAL_BASED_OUTPATIENT_CLINIC_OR_DEPARTMENT_OTHER): Admitting: Nurse Practitioner

## 2023-07-19 ENCOUNTER — Inpatient Hospital Stay: Attending: Nurse Practitioner

## 2023-07-19 VITALS — BP 142/50 | HR 72 | Temp 98.2°F | Resp 16 | Ht 67.0 in | Wt 168.0 lb

## 2023-07-19 VITALS — BP 135/50 | HR 59 | Temp 98.1°F | Resp 18 | Ht 67.0 in | Wt 168.0 lb

## 2023-07-19 VITALS — BP 146/50

## 2023-07-19 DIAGNOSIS — E785 Hyperlipidemia, unspecified: Secondary | ICD-10-CM | POA: Diagnosis not present

## 2023-07-19 DIAGNOSIS — Z992 Dependence on renal dialysis: Secondary | ICD-10-CM | POA: Insufficient documentation

## 2023-07-19 DIAGNOSIS — Z0000 Encounter for general adult medical examination without abnormal findings: Secondary | ICD-10-CM

## 2023-07-19 DIAGNOSIS — L930 Discoid lupus erythematosus: Secondary | ICD-10-CM | POA: Insufficient documentation

## 2023-07-19 DIAGNOSIS — D500 Iron deficiency anemia secondary to blood loss (chronic): Secondary | ICD-10-CM | POA: Insufficient documentation

## 2023-07-19 DIAGNOSIS — N186 End stage renal disease: Secondary | ICD-10-CM | POA: Diagnosis not present

## 2023-07-19 DIAGNOSIS — Z853 Personal history of malignant neoplasm of breast: Secondary | ICD-10-CM | POA: Insufficient documentation

## 2023-07-19 DIAGNOSIS — D509 Iron deficiency anemia, unspecified: Secondary | ICD-10-CM

## 2023-07-19 DIAGNOSIS — Z79899 Other long term (current) drug therapy: Secondary | ICD-10-CM | POA: Diagnosis not present

## 2023-07-19 DIAGNOSIS — D631 Anemia in chronic kidney disease: Secondary | ICD-10-CM

## 2023-07-19 DIAGNOSIS — D649 Anemia, unspecified: Secondary | ICD-10-CM

## 2023-07-19 DIAGNOSIS — G4733 Obstructive sleep apnea (adult) (pediatric): Secondary | ICD-10-CM | POA: Diagnosis not present

## 2023-07-19 DIAGNOSIS — Z801 Family history of malignant neoplasm of trachea, bronchus and lung: Secondary | ICD-10-CM | POA: Insufficient documentation

## 2023-07-19 DIAGNOSIS — M1990 Unspecified osteoarthritis, unspecified site: Secondary | ICD-10-CM | POA: Diagnosis not present

## 2023-07-19 DIAGNOSIS — K31811 Angiodysplasia of stomach and duodenum with bleeding: Secondary | ICD-10-CM | POA: Diagnosis not present

## 2023-07-19 DIAGNOSIS — I120 Hypertensive chronic kidney disease with stage 5 chronic kidney disease or end stage renal disease: Secondary | ICD-10-CM | POA: Diagnosis not present

## 2023-07-19 DIAGNOSIS — Z833 Family history of diabetes mellitus: Secondary | ICD-10-CM | POA: Insufficient documentation

## 2023-07-19 DIAGNOSIS — Z8249 Family history of ischemic heart disease and other diseases of the circulatory system: Secondary | ICD-10-CM | POA: Insufficient documentation

## 2023-07-19 DIAGNOSIS — Z87891 Personal history of nicotine dependence: Secondary | ICD-10-CM | POA: Diagnosis not present

## 2023-07-19 DIAGNOSIS — F0390 Unspecified dementia without behavioral disturbance: Secondary | ICD-10-CM | POA: Insufficient documentation

## 2023-07-19 DIAGNOSIS — Z888 Allergy status to other drugs, medicaments and biological substances status: Secondary | ICD-10-CM | POA: Diagnosis not present

## 2023-07-19 DIAGNOSIS — J4489 Other specified chronic obstructive pulmonary disease: Secondary | ICD-10-CM | POA: Diagnosis not present

## 2023-07-19 DIAGNOSIS — Z8673 Personal history of transient ischemic attack (TIA), and cerebral infarction without residual deficits: Secondary | ICD-10-CM | POA: Diagnosis not present

## 2023-07-19 DIAGNOSIS — E1122 Type 2 diabetes mellitus with diabetic chronic kidney disease: Secondary | ICD-10-CM | POA: Diagnosis not present

## 2023-07-19 DIAGNOSIS — Z806 Family history of leukemia: Secondary | ICD-10-CM | POA: Diagnosis not present

## 2023-07-19 DIAGNOSIS — I10 Essential (primary) hypertension: Secondary | ICD-10-CM | POA: Diagnosis not present

## 2023-07-19 LAB — IRON AND TIBC
Iron: 33 ug/dL (ref 28–170)
Saturation Ratios: 10 % — ABNORMAL LOW (ref 10.4–31.8)
TIBC: 344 ug/dL (ref 250–450)
UIBC: 311 ug/dL

## 2023-07-19 LAB — CBC WITH DIFFERENTIAL (CANCER CENTER ONLY)
Abs Immature Granulocytes: 0.05 K/uL (ref 0.00–0.07)
Basophils Absolute: 0.1 K/uL (ref 0.0–0.1)
Basophils Relative: 1 %
Eosinophils Absolute: 0.3 K/uL (ref 0.0–0.5)
Eosinophils Relative: 4 %
HCT: 27.1 % — ABNORMAL LOW (ref 36.0–46.0)
Hemoglobin: 7.9 g/dL — ABNORMAL LOW (ref 12.0–15.0)
Immature Granulocytes: 1 %
Lymphocytes Relative: 17 %
Lymphs Abs: 1.2 K/uL (ref 0.7–4.0)
MCH: 26.1 pg (ref 26.0–34.0)
MCHC: 29.2 g/dL — ABNORMAL LOW (ref 30.0–36.0)
MCV: 89.4 fL (ref 80.0–100.0)
Monocytes Absolute: 0.8 K/uL (ref 0.1–1.0)
Monocytes Relative: 12 %
Neutro Abs: 4.6 K/uL (ref 1.7–7.7)
Neutrophils Relative %: 65 %
Platelet Count: 253 K/uL (ref 150–400)
RBC: 3.03 MIL/uL — ABNORMAL LOW (ref 3.87–5.11)
RDW: 19.7 % — ABNORMAL HIGH (ref 11.5–15.5)
WBC Count: 7 K/uL (ref 4.0–10.5)
nRBC: 0.4 % — ABNORMAL HIGH (ref 0.0–0.2)

## 2023-07-19 LAB — FERRITIN: Ferritin: 136 ng/mL (ref 11–307)

## 2023-07-19 LAB — BASIC METABOLIC PANEL - CANCER CENTER ONLY
Anion gap: 13 (ref 5–15)
BUN: 39 mg/dL — ABNORMAL HIGH (ref 8–23)
CO2: 21 mmol/L — ABNORMAL LOW (ref 22–32)
Calcium: 9.3 mg/dL (ref 8.9–10.3)
Chloride: 105 mmol/L (ref 98–111)
Creatinine: 5.16 mg/dL — ABNORMAL HIGH (ref 0.44–1.00)
GFR, Estimated: 8 mL/min — ABNORMAL LOW (ref >60)
Glucose, Bld: 90 mg/dL (ref 70–99)
Potassium: 4 mmol/L (ref 3.5–5.1)
Sodium: 139 mmol/L (ref 135–145)

## 2023-07-19 LAB — SAMPLE TO BLOOD BANK

## 2023-07-19 LAB — PREPARE RBC (CROSSMATCH)

## 2023-07-19 MED ORDER — IRON SUCROSE 20 MG/ML IV SOLN
200.0000 mg | Freq: Once | INTRAVENOUS | Status: AC
  Administered 2023-07-19: 200 mg via INTRAVENOUS

## 2023-07-19 NOTE — Progress Notes (Signed)
 [facility]  [phone]) [phone] [phone]) [phone]  ID: Naomie LITTIE Blush OB: 1933/11/24  MR#: [id number]  RDW#:[id number]  Patient Care Team: Kristina Tinnie [name] as PCP - General (Physician Assistant) Nicholaus Sherlean LITTIE, [facility] (Inactive) as Pharmacist (Pharmacist)  REFERRING PROVIDER: Tinnie Kristina  REASON FOR REFERRAL: anemia of chronic disease  HPI: AZURI BOZARD is a 89 y.o. female with past medical history of hypertension, hyperlipidemia, IDA, COPD, diabetes, left breast cancer, OSA not on CPAP, stroke, ESRD on HD was referred to hematology for normocytic anemia.  Patient has history of ESRD on HD Tuesday Thursday and Saturday regimen.  Follows with Dr. Dennise as outpatient.  She has been getting EPO injection and iron  infusions per nephrology.  Labs from June 2024 showed hemoglobin of 8.1, WBC 6.3 and platelet 300.  Iron  32, saturation 12% and ferritin of 273, B12 523, folate 15.8, TSH 2.25.  Patient has history of left breast cancer.  Oncology history obtained from Dr. Evalene Charter [facility] heme-onc.  Last seen in March 2021.  Oncology History Overview Note  11/10/15: Mammogram with 2 cm left breast abnormality; US  confirmed. Axillary negative.  12/07/15: Bx : Breast, left, 6 o'clock, core biopsy  - Invasive ductal carcinoma - Nottingham combined histologic grade: 1 Tubule formation: 2 Nuclear grade: 2 Mitotic score: 1 - Invasive carcinoma measures approximately 9 mm in this core biopsy specimen - Microcalcifications are associated with invasive carcinoma - ER 100%; PR 1-2%; Her 2+, FISH negative   12/20/15: Partial Mastectomy A: Breast, left savi guided partial mastectomy -Invasive ductal carcinoma with associated ductal carcinoma in situ (DCIS) (see synoptic) -Invasive carcinoma measures 14.0 mm in greatest dimension -Margin status in part A only (correlate with additional parts to determine final margin status)      - Adenocarcinoma is  greater than 2 mm from all surgical margins      - DCIS is focally positive at the posterior black-inked surgical margin; DCIS is greater than 2 mm from all other surgical margins -Ancillary studies previously reported on       - Estrogen receptor: Positive, 91-100%, strong      - Progesterone receptor: Margins positive, 1-10%, moderate      - HER2/Neu: 2+ equivocal FISH results HER2/Neu: Not amplified -See comment   B: Breast, left, posterior margin, excision - Ductal carcinoma in-situ present, 6.0 mm from the black-inked surgical margin - Negative for invasive carcinoma   C: Breast, left, medial margin, excision - Negative for in-situ and invasive carcinoma   D: Breast, left, inferior margin, excision - Negative for in-situ and invasive carcinoma   E: Breast, left, lateral margin, excision - Negative for in-situ and invasive carcinoma   F: Breast, left, superior margin, excision - Ductal carcinoma in-situ is 1.7 mm from the blue-inked surgical margin - Negative for invasive carcinoma  Malignant neoplasm of left female breast (CMS-HCC)  12/16/2015 Initial Diagnosis  Malignant neoplasm of breast in female, estrogen receptor positive (RAF-HCC)   On anastrozole  1 mg daily    INTERVAL HISTORY: FAIZA BANSAL is a 89 y.o. female who was recently hospitalized for symptomatic anemia, found to have small bowel AVMs treated with APC, who returns to for new problem symptomatic anemia.  She was previously seen by Dr. Brutus for breast cancer Dr. Donivan plans to perform push enteroscopy next week.  Patient has dementia and history is by her husband.  She complains of fatigue.  He endorses melena.  REVIEW OF SYSTEMS:   Review of  Systems  Unable to perform ROS: Dementia  Constitutional:  Positive for malaise/fatigue. Negative for chills, fever and weight loss.  HENT:  Negative for hearing loss and nosebleeds.   Respiratory:  Negative for shortness of breath.   Cardiovascular:  Negative  for chest pain, palpitations and leg swelling.  Gastrointestinal:  Positive for melena. Negative for blood in stool, constipation and diarrhea.  Genitourinary:  Negative for dysuria and urgency.  Skin:  Negative for itching and rash.  Neurological:  Negative for dizziness, tingling, sensory change and weakness.  Endo/Heme/Allergies:  Negative for environmental allergies. Does not bruise/bleed easily.  Psychiatric/Behavioral:  Positive for memory loss.   As per HPI. Otherwise, a complete review of systems is negative.  PAST MEDICAL HISTORY: Past Medical History:  Diagnosis Date   Arthritis    Asthma    Calf pain    Cancer (HCC) 2016   left breast   COPD (chronic obstructive pulmonary disease) (HCC)    Diabetes (HCC)    Discoid lupus    Gastro-esophageal reflux    Glaucoma    Hyperlipemia    Hypertension    Iron  deficiency anemia    Joint pain    Leg swelling    Osteoarthritis    PVD (peripheral vascular disease) (HCC)    Sinus problem    Sleep apnea    No CPAP/ Can't tolerate   Stroke (HCC) 1987    PAST SURGICAL HISTORY: Past Surgical History:  Procedure Laterality Date   BREAST SURGERY Left    left lumpectomy   CAROTID ANGIOGRAPHY Left 06/25/2018   Procedure: CAROTID ANGIOGRAPHY, possible intervention;  Surgeon: Jama Cordella MATSU, MD;  Location: [facility] INVASIVE CV [facility];  Service: Cardiovascular;  Laterality: Left;   CAROTID ENDARTERECTOMY Right    CAROTID PTA/STENT INTERVENTION Left 06/25/2018   Procedure: CAROTID PTA/STENT INTERVENTION;  Surgeon: Jama Cordella MATSU, MD;  Location: [facility];  Service: Cardiovascular;  Laterality: Left;   CATARACT EXTRACTION Right 2013   CORONARY STENT PLACEMENT     L. L. E.   DIALYSIS/PERMA CATHETER INSERTION N/A 12/29/2020   Procedure: DIALYSIS/PERMA CATHETER INSERTION;  Surgeon: Marea Selinda RAMAN, MD;  Location: [facility];  Service: Cardiovascular;  Laterality: N/A;   DIALYSIS/PERMA CATHETER INSERTION N/A 12/14/2021    Procedure: DIALYSIS/PERMA CATHETER INSERTION;  Surgeon: Jama Cordella MATSU, MD;  Location: [facility];  Service: Cardiovascular;  Laterality: N/A;   ESOPHAGOGASTRODUODENOSCOPY N/A 05/28/2023   Procedure: EGD (ESOPHAGOGASTRODUODENOSCOPY);  Surgeon: Jinny Carmine, MD;  Location: [facility] ENDOSCOPY;  Service: Endoscopy;  Laterality: N/A;   EYE SURGERY Bilateral    cataract   HOT HEMOSTASIS  05/28/2023   Procedure: EGD, WITH ARGON PLASMA COAGULATION;  Surgeon: Jinny Carmine, MD;  Location: [facility] ENDOSCOPY;  Service: Endoscopy;;   LEFT HEART CATH AND CORONARY ANGIOGRAPHY Left 05/06/2018   Procedure: LEFT HEART CATH AND CORONARY ANGIOGRAPHY;  Surgeon: Hester Wolm PARAS, MD;  Location: [facility];  Service: Cardiovascular;  Laterality: Left;   LOWER EXTREMITY ANGIOGRAPHY Right 01/22/2018   Procedure: LOWER EXTREMITY ANGIOGRAPHY;  Surgeon: Jama Cordella MATSU, MD;  Location: [facility];  Service: Cardiovascular;  Laterality: Right;   RENAL ANGIOGRAPHY Left 02/26/2018   Procedure: RENAL ANGIOGRAPHY;  Surgeon: Jama Cordella MATSU, MD;  Location: [facility];  Service: Cardiovascular;  Laterality: Left;   STENT PLACEMENT VASCULAR ([facility] HX) Left    stent placement on LLE   TUBAL LIGATION     FAMILY HISTORY: Family History  Problem Relation Age  of Onset   Heart disease Mother    Diabetes Sister    Leukemia Daughter    Lung cancer Brother    Diabetes Other    Hypertension Other    Bladder Cancer Neg Hx    Kidney disease Neg Hx    Prostate cancer Neg Hx    Breast cancer Neg Hx    HEALTH MAINTENANCE: Social History   Tobacco Use   Smoking status: Former    Current packs/day: 0.00    Types: Cigarettes    Quit date: 1987    Years since quitting: 38.5   Smokeless tobacco: Never   Tobacco comments:    quit 31 years  Vaping Use   Vaping status: Never Used  Substance Use Topics   Alcohol use: No   Drug use: No   Allergies  Allergen Reactions   Benazepril Nausea  Only   Statins Other (See Comments)    Myalgia  swelling    Outside Source Comment: swelling   Current Outpatient Medications  Medication Sig Dispense Refill   ACCU-CHEK GUIDE test strip TEST BLOOD SUGAR THREE TIMES DAILY  AS  DIRECTED 300 strip 1   Accu-Chek Softclix Lancets lancets USE AS INSTRUCTED 3 TIMES A DAY 300 each 3   acetaminophen  (TYLENOL ) 500 MG tablet Take 1 tablet (500 mg total) by mouth every 6 (six) hours as needed for moderate pain or headache. 30 tablet 0   albuterol  (VENTOLIN  HFA) 108 (90 Base) MCG/ACT inhaler Inhale 2 puffs into the lungs every 6 (six) hours as needed for wheezing or shortness of breath. 8 g 2   Alcohol Swabs (B-D SINGLE USE SWABS REGULAR) PADS Use as directed for 3 times daily DX E11.65 100 each 1   amLODipine  (NORVASC ) 10 MG tablet TAKE 1 TABLET EVERY DAY AT BREAKFAST 90 tablet 1   anastrozole  (ARIMIDEX ) 1 MG tablet Take 1 mg by mouth daily.     atorvastatin  (LIPITOR) 40 MG tablet Take 40 mg by mouth daily.     Blood Glucose Monitoring Suppl (ACCU-CHEK GUIDE ME) w/Device KIT Use as instructed. DX e11.65 1 kit 0   Budeson-Glycopyrrol-Formoterol  (BREZTRI  AEROSPHERE) 160-9-4.8 MCG/ACT AERO Inhale 2 puffs into the lungs 2 (two) times daily. 10.7 g 11   docusate sodium  (COLACE) 100 MG capsule Take 200 mg by mouth at bedtime as needed for mild constipation.     donepezil  (ARICEPT ) 10 MG tablet TAKE 1 TABLET AT BEDTIME FOR MEMORY (NEED MD APPOINTMENT) 90 tablet 3   ferrous sulfate  325 (65 FE) MG tablet Take 1 tablet (325 mg total) by mouth daily with breakfast. 30 tablet 0   hydrALAZINE  (APRESOLINE ) 50 MG tablet Take 100 mg by mouth in the morning and at bedtime. Take one tablet in AM and two tablets in PM     isosorbide  mononitrate (IMDUR ) 60 MG 24 hr tablet Take 60 mg by mouth daily.     pantoprazole  (PROTONIX ) 40 MG tablet Take 1 tablet (40 mg total) by mouth daily. 30 tablet 0   clopidogrel  (PLAVIX ) 75 MG tablet TAKE 1 TABLET DAILY AT DINNER (Patient not  taking: Reported on 07/19/2023) 90 tablet 1   No current facility-administered medications for this visit.   OBJECTIVE: Vitals:   07/19/23 1045  BP: (!) 135/50  Pulse: (!) 59  Resp: 18  Temp: 98.1 F (36.7 C)  SpO2: 100%     Body mass index is 26.31 kg/m.      General: Well-developed, well-nourished, no acute distress. Eyes: Pink conjunctiva,  anicteric sclera. HEENT: Normocephalic, moist mucous membranes, clear oropharnyx. Lungs: Clear to auscultation bilaterally. Heart: Regular rate and rhythm. No rubs, murmurs, or gallops. Abdomen: Soft, nontender, nondistended. No organomegaly noted, normoactive bowel sounds. Musculoskeletal: No edema, cyanosis, or clubbing. Neuro: Alert. Dementia at baseline.  Skin: No rashes or petechiae noted. Psych: Normal affect. Lymphatics: No cervical, calvicular, axillary or inguinal LAD.   LAB RESULTS: Lab Results  Component Value Date   NA 139 07/19/2023   K 4.0 07/19/2023   CL 105 07/19/2023   CO2 21 (L) 07/19/2023   GLUCOSE 90 07/19/2023   BUN 39 (H) 07/19/2023   CREATININE 5.16 (H) 07/19/2023   CALCIUM  9.3 07/19/2023   PROT 7.7 05/25/2023   ALBUMIN 3.7 05/25/2023   AST 36 05/25/2023   ALT 37 05/25/2023   ALKPHOS 75 05/25/2023   BILITOT 0.9 05/25/2023   GFRNONAA 8 (L) 07/19/2023   GFRAA 31 (L) 02/13/2019   Lab Results  Component Value Date   WBC 7.0 07/19/2023   NEUTROABS 4.6 07/19/2023   HGB 7.9 (L) 07/19/2023   HCT 27.1 (L) 07/19/2023   MCV 89.4 07/19/2023   PLT 253 07/19/2023   Lab Results  Component Value Date   TIBC 385 05/25/2023   TIBC 257 06/18/2022   TIBC 238 (L) 08/12/2020   FERRITIN 130 05/25/2023   FERRITIN 273 (H) 06/18/2022   FERRITIN 152 (H) 08/12/2020   IRONPCTSAT 10 (L) 05/25/2023   IRONPCTSAT 12 (L) 06/18/2022   IRONPCTSAT 8 (LL) 08/12/2020   STUDIES: No results found.  ASSESSMENT AND PLAN:   LASHELL MOFFITT is a 89 y.o. female with pmh of hypertension, hyperlipidemia, IDA, COPD, diabetes, left  breast cancer, OSA not on CPAP, stroke, ESRD on HD was referred to hematology for normocytic anemia.  # Iron  deficiency anemia- chronic anemia of CKD, stage V, on hemodialysis and iron  deficiency anemia due to chronic blood loss in setting of gastric and small bowel AVMs.  In May 2025 she presented to [facility] ER with melena and found to have a hemoglobin of 6.7.  She underwent EGD which found multiple AVMs in the duodenum, treated with APC.  Responded appropriately to transfusion.  Hemoglobin was 9.4 at time of discharge.  Hemoglobin has dropped to 7.6 with dialysis and she was referred back to GI for further evaluation.  She is unsure if she has been receiving erythropoietin  during dialysis. - Goal ferritin around 500 in the setting of ESRD. - may consider long term octreotide to reduce risk of bleeding from small bowel AVMs.   # Symptomatic anemia- plan for 1 unit of PRBCs on Monday.  Given that she is quite symptomatic of her anemia, we will plan to give her 1 dose of IV iron  today.    # ESRD on HD - Patient follows with Dr. Dennise nephrology as outpatient.  She is on dialysis Tuesday Thursday and Saturday. Patient has been getting EPO injections and IV iron  infusions per nephrology.  We had previously deferred her hematology management to nephrology since she was found to assist.  However, given the acute drops in her blood counts, she was referred to hematology for management.  Discussed the need to optimize iron  stores in the setting of end-stage renal disease  # History of left breast cancer, stage Ia, ER 91 to 100% positive, 1 to 10% positive and HER2 negative. -Diagnosed in 2017.  Status post left lumpectomy at [facility].  Pathology showing IDC, 14 mm, margins negative for carcinoma, DCIS focally present at  the posterior black inked margin.  ER 91 to 100% positive, PR 1 to 10% and HER2 negative by FISH.  She was treated by Dr. Evalene Charter heme-onc at [facility].  Last followed up in March 2021.  She is on  anastrozole  1 mg daily since beginning of 2018.  Discussed with the patient that she had early stage hormone positive breast cancer with negative margins for carcinoma.  5 years of anastrozole  should be sufficient.  She is close to finishing 7 years of anastrozole .  I advised her to discontinue taking it.  She would be at a higher risk of bone related complications.  -Last mammogram was in September 2024 was negative. Plan to repeat September 2025.   Disposition:  Venofer  today Blood on Monday 7/21- lab (H&H), venofer  7/28- lab, see me, +/- venofer - la  Patient expressed understanding and was in agreement with this plan. She also understands that She can call clinic at any time with any questions, concerns, or complaints.   I spent a total of 35 minutes reviewing chart data, face-to-face evaluation with the patient, counseling and coordination of care as detailed above.  Tinnie KANDICE Dawn, NP   07/19/2023   CC: Dr Unk

## 2023-07-19 NOTE — Progress Notes (Signed)
 Patient referred back to to cancer center by Dr. Unk for iron  def anemia. She last saw Dr. Clista for breast cancer follow-up a year ago. She is due for a mammogram in Sept- but would need an order. Patient reports symptomatic shortness of breath anemia when she ambulates just a few steps with Rolator walker at home.

## 2023-07-19 NOTE — Progress Notes (Signed)
 [facility] [address] [place], [place] [address]  Internal MEDICINE  Office Visit Note  Patient Name: Jennifer Carrillo  [id number]  [id number]  Date of Service: 07/19/2023  Chief Complaint  Patient presents with   Medicare Wellness   Diabetes   Gastroesophageal Reflux   Hypertension   Hyperlipidemia   Quality Metric Gaps    Shingles, Pneumonia vaccines; Eye exam    HPI Jennifer Carrillo presents for an annual well visit  Well-appearing 89 y.o.female  Eye exam: Due for eye exam and will schedule this Other concerns: feeling cold and may be related to anemia. No abdominal pain. BM are dark looking likely from chronic blood loss. Some SOB and uses inhaler. Hasn't really been really using oxygen at night or daytime. Also likely worse due to anemia -Saw GI on Wed and had labs done to plan for procedure--enteroscopy. Was called and told she needed to go hematology office today and is going there after visit this morning.  On chart review hemoglobin had dropped further.     07/19/2023    8:54 AM 06/18/2022    8:58 AM 03/24/2020    9:04 AM  MMSE - Mini Mental State Exam  Orientation to time 3 3 0  Orientation to Place 4 3 5   Registration 2 3 3   Attention/ Calculation 4 5 5   Recall 2 2 3   Language- name 2 objects 2 2 2   Language- repeat 1 1 1   Language- follow 3 step command 3 3 3   Language- read & follow direction 1 1 1   Write a sentence 1 1 1   Copy design 1 1 1   Total score 24 25 25     Functional Status Survey: Is the patient deaf or have difficulty hearing?: No Does the patient have difficulty seeing, even when wearing glasses/contacts?: No Does the patient have difficulty concentrating, remembering, or making decisions?: No Does the patient have difficulty walking or climbing stairs?: No Does the patient have difficulty dressing or bathing?: No Does the patient have difficulty doing errands alone such as visiting a doctor's office or shopping?: No     05/24/2021    12:25 PM 01/04/2022   11:17 AM 03/30/2022   11:48 AM 06/18/2022    8:58 AM 07/19/2023    8:54 AM  Fall Risk  Falls in the past year? 0  0 0 0  Was there an injury with Fall? 0      Fall Risk Category Calculator 0      Fall Risk Category (Retired) Low       (RETIRED) Patient Fall Risk Level Moderate fall risk  Moderate fall risk      Patient at Risk for Falls Due to Impaired balance/gait;Impaired mobility;Medication side effect      Fall risk Follow up Falls evaluation completed;Education provided          Data saved with a previous flowsheet row definition       07/19/2023    8:54 AM  Depression screen PHQ 2/9  Decreased Interest 0  Down, Depressed, Hopeless 0  PHQ - 2 Score 0        No data to display            Current Medication: Outpatient Encounter Medications as of 07/19/2023  Medication Sig Note   ACCU-CHEK GUIDE test strip TEST BLOOD SUGAR THREE TIMES DAILY  AS  DIRECTED    Accu-Chek Softclix Lancets lancets USE AS INSTRUCTED 3 TIMES A DAY    acetaminophen  (TYLENOL )  500 MG tablet Take 1 tablet (500 mg total) by mouth every 6 (six) hours as needed for moderate pain or headache.    albuterol  (VENTOLIN  HFA) 108 (90 Base) MCG/ACT inhaler Inhale 2 puffs into the lungs every 6 (six) hours as needed for wheezing or shortness of breath. 05/25/2023: prn   Alcohol Swabs (B-D SINGLE USE SWABS REGULAR) PADS Use as directed for 3 times daily DX E11.65    amLODipine  (NORVASC ) 10 MG tablet TAKE 1 TABLET EVERY DAY AT BREAKFAST    atorvastatin  (LIPITOR) 40 MG tablet Take 40 mg by mouth daily.    Blood Glucose Monitoring Suppl (ACCU-CHEK GUIDE ME) w/Device KIT Use as instructed. DX e11.65    Budeson-Glycopyrrol-Formoterol  (BREZTRI  AEROSPHERE) 160-9-4.8 MCG/ACT AERO Inhale 2 puffs into the lungs 2 (two) times daily.    clopidogrel  (PLAVIX ) 75 MG tablet TAKE 1 TABLET DAILY AT DINNER    docusate sodium  (COLACE) 100 MG capsule Take 200 mg by mouth at bedtime as needed for mild  constipation.    donepezil  (ARICEPT ) 10 MG tablet TAKE 1 TABLET AT BEDTIME FOR MEMORY (NEED MD APPOINTMENT)    ferrous sulfate  325 (65 FE) MG tablet Take 1 tablet (325 mg total) by mouth daily with breakfast.    hydrALAZINE  (APRESOLINE ) 50 MG tablet Take 100 mg by mouth in the morning and at bedtime. Take one tablet in AM and two tablets in PM    isosorbide  mononitrate (IMDUR ) 60 MG 24 hr tablet Take 60 mg by mouth daily.    pantoprazole  (PROTONIX ) 40 MG tablet Take 1 tablet (40 mg total) by mouth daily.    No facility-administered encounter medications on file as of 07/19/2023.    Surgical History: Past Surgical History:  Procedure Laterality Date   BREAST SURGERY Left    left lumpectomy   CAROTID ANGIOGRAPHY Left 06/25/2018   Procedure: CAROTID ANGIOGRAPHY, possible intervention;  Surgeon: Jama Cordella MATSU, MD;  Location: [facility] IN[facility]ASIVE CV LAB;  Service: Cardiovascular;  Laterality: Left;   CAROTID ENDARTERECTOMY Right    CAROTID PTA/STENT INTERVENTION Left 06/25/2018   Procedure: CAROTID PTA/STENT INTERVENTION;  Surgeon: Jama Cordella MATSU, MD;  Location: [facility];  Service: Cardiovascular;  Laterality: Left;   CATARACT EXTRACTION Right 2013   CORONARY STENT PLACEMENT     L. L. E.   DIALYSIS/PERMA CATHETER INSERTION N/A 12/29/2020   Procedure: DIALYSIS/PERMA CATHETER INSERTION;  Surgeon: Marea Selinda RAMAN, MD;  Location: [facility];  Service: Cardiovascular;  Laterality: N/A;   DIALYSIS/PERMA CATHETER INSERTION N/A 12/14/2021   Procedure: DIALYSIS/PERMA CATHETER INSERTION;  Surgeon: Jama Cordella MATSU, MD;  Location: [facility];  Service: Cardiovascular;  Laterality: N/A;   ESOPHAGOGASTRODUODENOSCOPY N/A 05/28/2023   Procedure: EGD (ESOPHAGOGASTRODUODENOSCOPY);  Surgeon: Jinny Carmine, MD;  Location: [facility] ENDOSCOPY;  Service: Endoscopy;  Laterality: N/A;   EYE SURGERY Bilateral    cataract   HOT HEMOSTASIS  05/28/2023   Procedure: EGD, WITH ARGON PLASMA  COAGULATION;  Surgeon: Jinny Carmine, MD;  Location: [facility] ENDOSCOPY;  Service: Endoscopy;;   LEFT HEART CATH AND CORONARY ANGIOGRAPHY Left 05/06/2018   Procedure: LEFT HEART CATH AND CORONARY ANGIOGRAPHY;  Surgeon: Hester Wolm PARAS, MD;  Location: [facility];  Service: Cardiovascular;  Laterality: Left;   LOWER EXTREMITY ANGIOGRAPHY Right 01/22/2018   Procedure: LOWER EXTREMITY ANGIOGRAPHY;  Surgeon: Jama Cordella MATSU, MD;  Location: [facility];  Service: Cardiovascular;  Laterality: Right;   RENAL ANGIOGRAPHY Left 02/26/2018   Procedure: RENAL ANGIOGRAPHY;  Surgeon: Jama,  Cordella MATSU, MD;  Location: [facility];  Service: Cardiovascular;  Laterality: Left;   STENT PLACEMENT VASCULAR ([facility] HX) Left    stent placement on LLE   TUBAL LIGATION      Medical History: Past Medical History:  Diagnosis Date   Arthritis    Asthma    Calf pain    Cancer (HCC) 2016   left breast   COPD (chronic obstructive pulmonary disease) (HCC)    Diabetes (HCC)    Discoid lupus    Gastro-esophageal reflux    Glaucoma    Hyperlipemia    Hypertension    Iron  deficiency anemia    Joint pain    Leg swelling    Osteoarthritis    PVD (peripheral vascular disease) (HCC)    Sinus problem    Sleep apnea    No CPAP/ Can't tolerate   Stroke (HCC) 1987    Family History: Family History  Problem Relation Age of Onset   Heart disease Mother    Diabetes Sister    Leukemia Daughter    Lung cancer Brother    Diabetes Other    Hypertension Other    Bladder Cancer Neg Hx    Kidney disease Neg Hx    Prostate cancer Neg Hx    Breast cancer Neg Hx     Social History   Socioeconomic History   Marital status: Married    Spouse name: Not on file   Number of children: Not on file   Years of education: Not on file   Highest education level: Not on file  Occupational History   Not on file  Tobacco Use   Smoking status: Former    Current packs/day: 0.00    Types: Cigarettes     Quit date: 1987    Years since quitting: 38.5   Smokeless tobacco: Never   Tobacco comments:    quit 31 years  Vaping Use   Vaping status: Never Used  Substance and Sexual Activity   Alcohol use: No   Drug use: No   Sexual activity: Not on file  Other Topics Concern   Not on file  Social History Narrative   Walks with a rolling walker; remote hx of smoking; no alcohol; [place]- with husband/son. Daughter lives in [place].    Social Drivers of Corporate investment banker Strain: High Risk (07/17/2023)   Received from [facility]   Overall Financial Resource Strain (CARDIA)    Difficulty of Paying Living Expenses: Hard  Food Insecurity: Food Insecurity Present (07/17/2023)   Received from [facility]   Hunger Vital Sign    Within the past 12 months, you worried that your food would run out before you got the money to buy more.: Sometimes true    Within the past 12 months, the food you bought just didn't last and you didn't have money to get more.: Often true  Transportation Needs: Unmet Transportation Needs (07/17/2023)   Received from [facility] - Transportation    In the past 12 months, has lack of transportation kept you from medical appointments or from getting medications?: Yes    Lack of Transportation (Non-Medical): Yes  Physical Activity: Not on file  Stress: Not on file  Social Connections: Moderately Isolated (05/26/2023)   Social Connection and Isolation Panel    Frequency of Communication with Friends and Family: More than three times a week    Frequency of Social Gatherings  with Friends and Family: More than three times a week    Attends Religious Services: Never    Active Member of Clubs or Organizations: No    Attends Banker Meetings: Never    Marital Status: Married  Catering manager Violence: Not At Risk (05/26/2023)   Humiliation, Afraid, Rape, and Kick questionnaire    Fear of  Current or Ex-Partner: No    Emotionally Abused: No    Physically Abused: No    Sexually Abused: No      Review of Systems  Constitutional:  Positive for fatigue.  Respiratory:  Positive for shortness of breath. Negative for cough, chest tightness and wheezing.   Cardiovascular:  Negative for chest pain and palpitations.  Gastrointestinal:  Negative for abdominal pain, nausea and vomiting.       Dark tarry stools  Genitourinary:  Negative for dysuria.  Musculoskeletal:  Positive for gait problem.  Skin:  Negative for rash.  Neurological:  Positive for weakness. Negative for headaches.  Psychiatric/Behavioral:  Negative for suicidal ideas.     Vital Signs: BP (!) 142/50 Comment: 143/77  Pulse 72   Temp 98.2 F (36.8 C)   Resp 16   Ht 5' 7 (1.702 m)   Wt 168 lb (76.2 kg)   SpO2 100%   BMI 26.31 kg/m    Physical Exam Vitals and nursing note reviewed.  Constitutional:      Appearance: Normal appearance.  HENT:     Head: Normocephalic and atraumatic.  Cardiovascular:     Rate and Rhythm: Normal rate and regular rhythm.  Pulmonary:     Effort: Pulmonary effort is normal.     Breath sounds: Normal breath sounds.  Skin:    General: Skin is warm and dry.  Neurological:     Mental Status: She is alert.     Motor: Weakness present.     Gait: Gait abnormal.  Psychiatric:        Mood and Affect: Mood normal.        Behavior: Behavior normal.        Assessment/Plan: 1. Encounter for annual wellness exam in Medicare patient (Primary) AWV performed, due for eye exam  2. Iron  deficiency anemia due to chronic blood loss Followed by GI and hematology and has visit with hematology today to address drop in hemoglobin  3. Primary hypertension Borderline in office and will monitor    General Counseling: tysha grismore understanding of the findings of todays visit and agrees with plan of treatment. I have discussed any further diagnostic evaluation that may be  needed or ordered today. We also reviewed her medications today. she has been encouraged to call the office with any questions or concerns that should arise related to todays visit.    No orders of the defined types were placed in this encounter.   No orders of the defined types were placed in this encounter.   Return in about 6 months (around 01/19/2024) for general follow up.   Total time spent:30 Minutes Time spent includes review of chart, medications, test results, and follow up plan with the patient.    Controlled Substance Database was reviewed by me.  This patient was seen by Tinnie Pro, PA-C in collaboration with Dr. Sigrid Bathe as a part of collaborative care agreement.  Tinnie Pro, PA-C Internal medicine

## 2023-07-19 NOTE — Addendum Note (Signed)
 Addended by: Rayshell Goecke G on: 07/19/2023 03:53 PM   Modules accepted: Orders

## 2023-07-19 NOTE — Patient Instructions (Signed)

## 2023-07-20 DIAGNOSIS — N186 End stage renal disease: Secondary | ICD-10-CM | POA: Diagnosis not present

## 2023-07-20 DIAGNOSIS — D631 Anemia in chronic kidney disease: Secondary | ICD-10-CM | POA: Diagnosis not present

## 2023-07-20 DIAGNOSIS — Z992 Dependence on renal dialysis: Secondary | ICD-10-CM | POA: Diagnosis not present

## 2023-07-22 ENCOUNTER — Inpatient Hospital Stay

## 2023-07-22 DIAGNOSIS — L930 Discoid lupus erythematosus: Secondary | ICD-10-CM | POA: Diagnosis not present

## 2023-07-22 DIAGNOSIS — D631 Anemia in chronic kidney disease: Secondary | ICD-10-CM | POA: Diagnosis not present

## 2023-07-22 DIAGNOSIS — J4489 Other specified chronic obstructive pulmonary disease: Secondary | ICD-10-CM | POA: Diagnosis not present

## 2023-07-22 DIAGNOSIS — Z888 Allergy status to other drugs, medicaments and biological substances status: Secondary | ICD-10-CM | POA: Diagnosis not present

## 2023-07-22 DIAGNOSIS — I120 Hypertensive chronic kidney disease with stage 5 chronic kidney disease or end stage renal disease: Secondary | ICD-10-CM | POA: Diagnosis not present

## 2023-07-22 DIAGNOSIS — D500 Iron deficiency anemia secondary to blood loss (chronic): Secondary | ICD-10-CM | POA: Diagnosis not present

## 2023-07-22 DIAGNOSIS — M1990 Unspecified osteoarthritis, unspecified site: Secondary | ICD-10-CM | POA: Diagnosis not present

## 2023-07-22 DIAGNOSIS — D649 Anemia, unspecified: Secondary | ICD-10-CM

## 2023-07-22 DIAGNOSIS — Z8249 Family history of ischemic heart disease and other diseases of the circulatory system: Secondary | ICD-10-CM | POA: Diagnosis not present

## 2023-07-22 DIAGNOSIS — Z79899 Other long term (current) drug therapy: Secondary | ICD-10-CM | POA: Diagnosis not present

## 2023-07-22 DIAGNOSIS — N186 End stage renal disease: Secondary | ICD-10-CM | POA: Diagnosis not present

## 2023-07-22 DIAGNOSIS — E785 Hyperlipidemia, unspecified: Secondary | ICD-10-CM | POA: Diagnosis not present

## 2023-07-22 DIAGNOSIS — F0390 Unspecified dementia without behavioral disturbance: Secondary | ICD-10-CM | POA: Diagnosis not present

## 2023-07-22 DIAGNOSIS — Z87891 Personal history of nicotine dependence: Secondary | ICD-10-CM | POA: Diagnosis not present

## 2023-07-22 DIAGNOSIS — Z8673 Personal history of transient ischemic attack (TIA), and cerebral infarction without residual deficits: Secondary | ICD-10-CM | POA: Diagnosis not present

## 2023-07-22 DIAGNOSIS — K31811 Angiodysplasia of stomach and duodenum with bleeding: Secondary | ICD-10-CM | POA: Diagnosis not present

## 2023-07-22 DIAGNOSIS — Z801 Family history of malignant neoplasm of trachea, bronchus and lung: Secondary | ICD-10-CM | POA: Diagnosis not present

## 2023-07-22 DIAGNOSIS — Z833 Family history of diabetes mellitus: Secondary | ICD-10-CM | POA: Diagnosis not present

## 2023-07-22 DIAGNOSIS — Z853 Personal history of malignant neoplasm of breast: Secondary | ICD-10-CM | POA: Diagnosis not present

## 2023-07-22 DIAGNOSIS — Z992 Dependence on renal dialysis: Secondary | ICD-10-CM | POA: Diagnosis not present

## 2023-07-22 DIAGNOSIS — E1122 Type 2 diabetes mellitus with diabetic chronic kidney disease: Secondary | ICD-10-CM | POA: Diagnosis not present

## 2023-07-22 DIAGNOSIS — D509 Iron deficiency anemia, unspecified: Secondary | ICD-10-CM

## 2023-07-22 DIAGNOSIS — Z806 Family history of leukemia: Secondary | ICD-10-CM | POA: Diagnosis not present

## 2023-07-22 DIAGNOSIS — G4733 Obstructive sleep apnea (adult) (pediatric): Secondary | ICD-10-CM | POA: Diagnosis not present

## 2023-07-22 MED ORDER — SODIUM CHLORIDE 0.9% FLUSH
10.0000 mL | INTRAVENOUS | Status: DC | PRN
  Filled 2023-07-22: qty 10

## 2023-07-22 MED ORDER — DIPHENHYDRAMINE HCL 25 MG PO CAPS
25.0000 mg | ORAL_CAPSULE | Freq: Once | ORAL | Status: AC
  Administered 2023-07-22: 25 mg via ORAL
  Filled 2023-07-22: qty 1

## 2023-07-22 MED ORDER — ACETAMINOPHEN 325 MG PO TABS
650.0000 mg | ORAL_TABLET | Freq: Once | ORAL | Status: AC
  Administered 2023-07-22: 650 mg via ORAL
  Filled 2023-07-22: qty 2

## 2023-07-22 MED ORDER — SODIUM CHLORIDE 0.9% IV SOLUTION
250.0000 mL | INTRAVENOUS | Status: DC
  Administered 2023-07-22: 250 mL via INTRAVENOUS
  Filled 2023-07-22: qty 250

## 2023-07-22 NOTE — Patient Instructions (Signed)

## 2023-07-23 DIAGNOSIS — Z992 Dependence on renal dialysis: Secondary | ICD-10-CM | POA: Diagnosis not present

## 2023-07-23 DIAGNOSIS — N250 Renal osteodystrophy: Secondary | ICD-10-CM | POA: Diagnosis not present

## 2023-07-23 DIAGNOSIS — N186 End stage renal disease: Secondary | ICD-10-CM | POA: Diagnosis not present

## 2023-07-23 LAB — BPAM RBC
Blood Product Expiration Date: 202508102359
ISSUE DATE / TIME: 202507140815
Unit Type and Rh: 202508102359
Unit Type and Rh: 5100

## 2023-07-23 LAB — TYPE AND SCREEN
ABO/RH(D): O POS
Antibody Screen: NEGATIVE
Unit division: 0

## 2023-07-24 ENCOUNTER — Ambulatory Visit
Admission: RE | Admit: 2023-07-24 | Discharge: 2023-07-24 | Disposition: A | Discharge status: Home / Self Care | Attending: Gastroenterology | Admitting: Gastroenterology

## 2023-07-24 ENCOUNTER — Encounter: Payer: Self-pay | Admitting: Gastroenterology

## 2023-07-24 ENCOUNTER — Other Ambulatory Visit: Payer: Self-pay

## 2023-07-24 ENCOUNTER — Encounter
Admission: RE | Disposition: A | Discharge status: Home / Self Care | Payer: Self-pay | Source: Home / Self Care | Attending: Gastroenterology

## 2023-07-24 ENCOUNTER — Ambulatory Visit: Admitting: Registered Nurse

## 2023-07-24 DIAGNOSIS — D631 Anemia in chronic kidney disease: Secondary | ICD-10-CM | POA: Insufficient documentation

## 2023-07-24 DIAGNOSIS — I252 Old myocardial infarction: Secondary | ICD-10-CM | POA: Insufficient documentation

## 2023-07-24 DIAGNOSIS — K6389 Other specified diseases of intestine: Secondary | ICD-10-CM | POA: Diagnosis not present

## 2023-07-24 DIAGNOSIS — E1151 Type 2 diabetes mellitus with diabetic peripheral angiopathy without gangrene: Secondary | ICD-10-CM | POA: Insufficient documentation

## 2023-07-24 DIAGNOSIS — I120 Hypertensive chronic kidney disease with stage 5 chronic kidney disease or end stage renal disease: Secondary | ICD-10-CM | POA: Diagnosis not present

## 2023-07-24 DIAGNOSIS — Z5986 Financial insecurity: Secondary | ICD-10-CM | POA: Diagnosis not present

## 2023-07-24 DIAGNOSIS — Z5982 Transportation insecurity: Secondary | ICD-10-CM | POA: Diagnosis not present

## 2023-07-24 DIAGNOSIS — Z992 Dependence on renal dialysis: Secondary | ICD-10-CM | POA: Diagnosis not present

## 2023-07-24 DIAGNOSIS — K31811 Angiodysplasia of stomach and duodenum with bleeding: Secondary | ICD-10-CM | POA: Insufficient documentation

## 2023-07-24 DIAGNOSIS — Z87891 Personal history of nicotine dependence: Secondary | ICD-10-CM | POA: Diagnosis not present

## 2023-07-24 DIAGNOSIS — E1122 Type 2 diabetes mellitus with diabetic chronic kidney disease: Secondary | ICD-10-CM | POA: Diagnosis not present

## 2023-07-24 DIAGNOSIS — K219 Gastro-esophageal reflux disease without esophagitis: Secondary | ICD-10-CM | POA: Diagnosis not present

## 2023-07-24 DIAGNOSIS — N186 End stage renal disease: Secondary | ICD-10-CM | POA: Insufficient documentation

## 2023-07-24 DIAGNOSIS — J4489 Other specified chronic obstructive pulmonary disease: Secondary | ICD-10-CM | POA: Insufficient documentation

## 2023-07-24 DIAGNOSIS — D500 Iron deficiency anemia secondary to blood loss (chronic): Secondary | ICD-10-CM | POA: Diagnosis not present

## 2023-07-24 DIAGNOSIS — G4730 Sleep apnea, unspecified: Secondary | ICD-10-CM | POA: Diagnosis not present

## 2023-07-24 DIAGNOSIS — Z8673 Personal history of transient ischemic attack (TIA), and cerebral infarction without residual deficits: Secondary | ICD-10-CM | POA: Diagnosis not present

## 2023-07-24 DIAGNOSIS — Z5941 Food insecurity: Secondary | ICD-10-CM | POA: Insufficient documentation

## 2023-07-24 DIAGNOSIS — K5521 Angiodysplasia of colon with hemorrhage: Secondary | ICD-10-CM | POA: Diagnosis not present

## 2023-07-24 HISTORY — PX: HOT HEMOSTASIS: SHX5433

## 2023-07-24 HISTORY — PX: SUBMUCOSAL INJECTION: SHX5543

## 2023-07-24 HISTORY — PX: ENTEROSCOPY: SHX5533

## 2023-07-24 SURGERY — ENTEROSCOPY
Anesthesia: General

## 2023-07-24 MED ORDER — EPINEPHRINE 1 MG/10ML IJ SOSY
PREFILLED_SYRINGE | INTRAMUSCULAR | Status: AC
  Filled 2023-07-24: qty 10

## 2023-07-24 MED ORDER — STERILE WATER FOR IRRIGATION IR SOLN
Status: DC | PRN
  Administered 2023-07-24: 6060 mL

## 2023-07-24 MED ORDER — SODIUM CHLORIDE 0.9 % IV SOLN: INTRAVENOUS | Status: DC

## 2023-07-24 MED ORDER — SODIUM CHLORIDE (PF) 0.9 % IJ SOLN
PREFILLED_SYRINGE | INTRAMUSCULAR | Status: DC | PRN
  Administered 2023-07-24: 1 mL

## 2023-07-24 MED ORDER — PROPOFOL 10 MG/ML IV BOLUS
INTRAVENOUS | Status: DC | PRN
  Administered 2023-07-24 (×7): 20 mg via INTRAVENOUS
  Administered 2023-07-24: 10 mg via INTRAVENOUS
  Administered 2023-07-24: 50 mg via INTRAVENOUS
  Administered 2023-07-24: 20 mg via INTRAVENOUS

## 2023-07-24 MED ORDER — DEXMEDETOMIDINE HCL IN NACL 80 MCG/20ML IV SOLN
INTRAVENOUS | Status: DC | PRN
  Administered 2023-07-24: 8 ug via INTRAVENOUS

## 2023-07-24 MED ORDER — LIDOCAINE HCL (CARDIAC) PF 100 MG/5ML IV SOSY
PREFILLED_SYRINGE | INTRAVENOUS | Status: DC | PRN
  Administered 2023-07-24: 60 mg via INTRAVENOUS

## 2023-07-24 NOTE — H&P (Signed)
 Jennifer JONELLE Brooklyn, Carrillo [facility], [address]  [place], [place] [address]  Main: [phone] Fax:  [phone] Pager: [phone]   Primary Care Physician:  Jennifer Tinnie POUR, Carrillo Primary Gastroenterologist:  Dr. Corinn JONELLE Carrillo  Pre-Procedure History & Physical: HPI:  Jennifer Carrillo is a 89 y.o. female is here for an enteroscopy.   Past Medical History:  Diagnosis Date   Arthritis    Asthma    Calf pain    Cancer (HCC) 2016   left breast   COPD (chronic obstructive pulmonary disease) (HCC)    Diabetes (HCC)    Discoid lupus    Gastro-esophageal reflux    Glaucoma    Hyperlipemia    Hypertension    Iron  deficiency anemia    Joint pain    Leg swelling    Osteoarthritis    PVD (peripheral vascular disease) (HCC)    Sinus problem    Sleep apnea    No CPAP/ Can't tolerate   Stroke (HCC) 1987    Past Surgical History:  Procedure Laterality Date   BREAST SURGERY Left    left lumpectomy   CAROTID ANGIOGRAPHY Left 06/25/2018   Procedure: CAROTID ANGIOGRAPHY, possible intervention;  Surgeon: Jennifer Cordella MATSU, Carrillo;  Location: [facility];  Service: Cardiovascular;  Laterality: Left;   CAROTID ENDARTERECTOMY Right    CAROTID PTA/STENT INTERVENTION Left 06/25/2018   Procedure: CAROTID PTA/STENT INTERVENTION;  Surgeon: Jennifer Cordella MATSU, Carrillo;  Location: [facility];  Service: Cardiovascular;  Laterality: Left;   CATARACT EXTRACTION Right 2013   CORONARY STENT PLACEMENT     L. L. E.   DIALYSIS/PERMA CATHETER INSERTION N/A 12/29/2020   Procedure: DIALYSIS/PERMA CATHETER INSERTION;  Surgeon: Jennifer Selinda RAMAN, Carrillo;  Location: [facility];  Service: Cardiovascular;  Laterality: N/A;   DIALYSIS/PERMA CATHETER INSERTION N/A 12/14/2021   Procedure: DIALYSIS/PERMA CATHETER INSERTION;  Surgeon: Jennifer Cordella MATSU, Carrillo;  Location: [facility];  Service: Cardiovascular;  Laterality: N/A;   ESOPHAGOGASTRODUODENOSCOPY  N/A 05/28/2023   Procedure: EGD (ESOPHAGOGASTRODUODENOSCOPY);  Surgeon: Jennifer Carmine, Carrillo;  Location: [facility] ENDOSCOPY;  Service: Endoscopy;  Laterality: N/A;   EYE SURGERY Bilateral    cataract   HOT HEMOSTASIS  05/28/2023   Procedure: EGD, WITH ARGON PLASMA COAGULATION;  Surgeon: Jennifer Carmine, Carrillo;  Location: [facility] ENDOSCOPY;  Service: Endoscopy;;   LEFT HEART CATH AND CORONARY ANGIOGRAPHY Left 05/06/2018   Procedure: LEFT HEART CATH AND CORONARY ANGIOGRAPHY;  Surgeon: Jennifer Wolm PARAS, Carrillo;  Location: [facility];  Service: Cardiovascular;  Laterality: Left;   LOWER EXTREMITY ANGIOGRAPHY Right 01/22/2018   Procedure: LOWER EXTREMITY ANGIOGRAPHY;  Surgeon: Jennifer Cordella MATSU, Carrillo;  Location: [facility];  Service: Cardiovascular;  Laterality: Right;   RENAL ANGIOGRAPHY Left 02/26/2018   Procedure: RENAL ANGIOGRAPHY;  Surgeon: Jennifer Cordella MATSU, Carrillo;  Location: [facility];  Service: Cardiovascular;  Laterality: Left;   STENT PLACEMENT VASCULAR ([facility] HX) Left    stent placement on LLE   TUBAL LIGATION      Prior to Admission medications   Medication Sig Start Date End Date Taking? Authorizing Provider  amLODipine  (NORVASC ) 10 MG tablet TAKE 1 TABLET EVERY DAY AT BREAKFAST 06/14/21  Yes Khan, Fozia Carrillo, Carrillo  anastrozole  (ARIMIDEX ) 1 MG tablet Take 1 mg by mouth daily. 04/27/21  Yes Jennifer Carrillo  aspirin  81 MG chewable tablet Chew 81 mg by mouth daily.   Yes Jennifer Carrillo  atorvastatin  (LIPITOR) 40 MG tablet Take 40 mg by mouth daily.   Yes Jennifer Carrillo  clopidogrel  (PLAVIX ) 75 MG tablet TAKE 1 TABLET DAILY AT [facility] 05/31/23  Yes Abernathy, Alyssa, NP  donepezil  (ARICEPT ) 10 MG tablet TAKE 1 TABLET AT BEDTIME FOR MEMORY (NEED Carrillo APPOINTMENT) 02/01/23  Yes McDonough, Tinnie POUR, Carrillo  ferrous sulfate  325 (65 FE) MG tablet Take 1 tablet (325 mg total) by mouth daily with breakfast. 05/29/23  Yes Laurita Pillion, Carrillo  isosorbide  mononitrate (IMDUR ) 60 MG 24  hr tablet Take 60 mg by mouth daily. 12/13/20  Yes Jennifer Carrillo  ACCU-CHEK GUIDE test strip TEST BLOOD SUGAR THREE TIMES DAILY  AS  DIRECTED 04/27/20   Khan, Fozia Carrillo, Carrillo  Accu-Chek Softclix Lancets lancets USE AS INSTRUCTED 3 TIMES A DAY 09/22/19   Khan, Fozia Carrillo, Carrillo  acetaminophen  (TYLENOL ) 500 MG tablet Take 1 tablet (500 mg total) by mouth every 6 (six) hours as needed for moderate pain or headache. 12/31/20   Jennifer Calix, Carrillo  albuterol  (VENTOLIN  HFA) 108 [phone] Base) MCG/ACT inhaler Inhale 2 puffs into the lungs every 6 (six) hours as needed for wheezing or shortness of breath. 02/01/23   McDonough, Tinnie POUR, Carrillo  Alcohol Swabs (B-D SINGLE USE SWABS REGULAR) PADS Use as directed for 3 times daily DX E11.65 06/22/19   Jennifer Juliene PARAS, NP  Blood Glucose Monitoring Suppl (ACCU-CHEK GUIDE ME) w/Device KIT Use as instructed. DX e11.65 06/18/19   Jennifer Juliene PARAS, NP  Budeson-Glycopyrrol-Formoterol  (BREZTRI  AEROSPHERE) 160-9-4.8 MCG/ACT AERO Inhale 2 puffs into the lungs 2 (two) times daily. 02/01/23   McDonough, Jennifer Carrillo  docusate sodium  (COLACE) 100 MG capsule Take 200 mg by mouth at bedtime as needed for mild constipation.    Jennifer Carrillo  hydrALAZINE  (APRESOLINE ) 50 MG tablet Take 100 mg by mouth in the morning and at bedtime. Take one tablet in AM and two tablets in PM 12/12/20 07/19/23  Jennifer Carrillo  pantoprazole  (PROTONIX ) 40 MG tablet Take 1 tablet (40 mg total) by mouth daily. 05/28/23 07/19/23  Laurita Pillion, Carrillo    Allergies as of 07/17/2023 - Review Complete 06/26/2023  Allergen Reaction Noted   Benazepril Nausea Only 05/28/2013    Family History  Problem Relation Age of Onset   Heart disease Mother    Diabetes Sister    Leukemia Daughter    Lung cancer Brother    Diabetes Other    Hypertension Other    Bladder Cancer Neg Hx    Kidney disease Neg Hx    Prostate cancer Neg Hx    Breast cancer Neg Hx     Social History   Socioeconomic History    Marital status: Married    Spouse name: Not on file   Number of children: Not on file   Years of education: Not on file   Highest education level: Not on file  Occupational History   Not on file  Tobacco Use   Smoking status: Former    Current packs/day: 0.00    Types: Cigarettes    Quit date: 1987    Years since quitting: 38.5   Smokeless tobacco: Never   Tobacco comments:    quit 31 years  Vaping Use   Vaping status: Never Used  Substance and Sexual Activity   Alcohol use: No   Drug use: No   Sexual activity: Not on file  Other Topics Concern   Not on file  Social History Narrative  Walks with a rolling walker; remote hx of smoking; no alcohol; [place]- with husband/son. Daughter lives in [place].    Social Drivers of Corporate investment banker Strain: High Risk (07/17/2023)   Received from [facility]   Overall Financial Resource Strain (CARDIA)    Difficulty of Paying Living Expenses: Hard  Food Insecurity: Food Insecurity Present (07/17/2023)   Received from [facility]   Hunger Vital Sign    Within the past 12 months, you worried that your food would run out before you got the money to buy more.: Sometimes true    Within the past 12 months, the food you bought just didn't last and you didn't have money to get more.: Often true  Transportation Needs: Unmet Transportation Needs (07/17/2023)   Received from [facility] - Transportation    In the past 12 months, has lack of transportation kept you from medical appointments or from getting medications?: Yes    Lack of Transportation (Non-Medical): Yes  Physical Activity: Not on file  Stress: Not on file  Social Connections: Moderately Isolated (05/26/2023)   Social Connection and Isolation Panel    Frequency of Communication with Friends and Family: More than three times a week    Frequency of Social Gatherings with Friends and Family: More than three  times a week    Attends Religious Services: Never    Database administrator or Organizations: No    Attends Banker Meetings: Never    Marital Status: Married  Catering manager Violence: Not At Risk (05/26/2023)   Humiliation, Afraid, Rape, and Kick questionnaire    Fear of Current or Ex-Partner: No    Emotionally Abused: No    Physically Abused: No    Sexually Abused: No    Review of Systems: See HPI, otherwise negative ROS  Physical Exam: BP (!) 139/51   Pulse 63   Temp (!) 96.5 F (35.8 C) (Temporal)   Resp 16   Ht 5' 7 (1.702 Carrillo)   Wt 76.2 kg   SpO2 100%   BMI 26.31 kg/Carrillo  General:   Alert,  pleasant and cooperative in NAD Head:  Normocephalic and atraumatic. Neck:  Supple; no masses or thyromegaly. Lungs:  Clear throughout to auscultation.    Heart:  Regular rate and rhythm. Abdomen:  Soft, nontender and nondistended. Normal bowel sounds, without guarding, and without rebound.   Neurologic:  Alert and  oriented x4;  grossly normal neurologically.  Impression/Plan: Jennifer Carrillo is here for an enteroscopy to be performed for melena, acute on chronic anemia, h/o AVMs  Risks, benefits, limitations, and alternatives regarding  enteroscopy have been reviewed with the patient.  Questions have been answered.  All parties agreeable.   Jennifer Brooklyn, Carrillo  07/24/2023, 8:35 AM

## 2023-07-24 NOTE — Op Note (Signed)
 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 Estimated blood loss: None. Procedure:             Pre-Anesthesia Assessment:                        - Prior to the procedure, a History and Physical was                         performed, and patient medications and allergies were                         reviewed. The patient is competent. The risks and                         benefits of the procedure and the sedation options and                         risks were discussed with the patient. All questions                         were answered and informed consent was obtained.                         Patient identification and proposed procedure were                         verified by the physician, the nurse, the                         anesthesiologist, the anesthetist and the technician                         in the pre-procedure area in the procedure room in the                         endoscopy suite. Mental Status Examination: alert and                         oriented. Airway Examination: normal oropharyngeal  airway and neck mobility. Respiratory Examination:                         clear to auscultation. CV Examination: normal.                         Prophylactic  Antibiotics: The patient does not require                         prophylactic antibiotics. Prior Anticoagulants: The                         patient has taken Plavix  (clopidogrel ), last dose was                         5 days prior to procedure. ASA Grade Assessment: III -                         A patient with severe systemic disease. After                         reviewing the risks and benefits, the patient was                         deemed in satisfactory condition to undergo the                         procedure. The anesthesia plan was to use general                         anesthesia. Immediately prior to administration of                         medications, the patient was re-assessed for adequacy                         to receive sedatives. The heart rate, respiratory                         rate, oxygen saturations, blood pressure, adequacy of                         pulmonary ventilation, and response to care were                         monitored throughout the procedure. The physical                         status of the patient was re-assessed after the                         procedure.                        After obtaining informed consent, the endoscope was                         passed under direct vision. Throughout the procedure,  the patient's blood pressure, pulse, and oxygen                         saturations were monitored continuously. The                         Colonoscope was introduced through the mouth and                         advanced to the proximal jejunum. The small bowel                         enteroscopy was accomplished without difficulty. The                         patient tolerated the procedure fairly well. Findings:      A single angioectasia with stigmata of recent bleeding was found in the       fourth portion of the duodenum. Area was successfully injected with 4 mL       of a 0.1 mg/mL solution of epinephrine   for hemostasis. Estimated blood       loss: none. Coagulation for hemostasis using argon plasma was       successful. Estimated blood loss: none.      The esophagus was normal.      The stomach was normal. Impression:            - A single recently bleeding angioectasia in the                         duodenum. Injected. Treated with argon plasma                         coagulation (APC).                        - Normal esophagus.                        - Normal stomach.                        - No specimens collected. Recommendation:        - Discharge patient to home (with escort).                        - Resume previous diet today.                        - Continue present medications.                        - Return to my office in 3 months. Procedure Code(s):     --- Professional ---                        [phone], Small intestinal endoscopy, enteroscopy beyond                         second portion of duodenum, not including ileum; with  control of bleeding (eg, injection, bipolar cautery,                         unipolar cautery, laser, heater probe, stapler, plasma                         coagulator) Diagnosis Code(s):     --- Professional ---                        K31.811, Angiodysplasia of stomach and duodenum with                         bleeding                        D50.0, Iron  deficiency anemia secondary to blood loss                         (chronic)                        K92.1, Melena (includes Hematochezia) CPT copyright 2022 American Medical Association. All rights reserved. The codes documented in this report are preliminary and upon coder review may  be revised to meet current compliance requirements. Dr. Corinn Brooklyn Corinn Jess Brooklyn MD, MD 07/24/2023 9:56:13 AM This report has been signed electronically. Number of Addenda: 0 Note Initiated On: 07/24/2023 9:07 AM Estimated Blood Loss:  Estimated blood loss: none.      [facility]

## 2023-07-24 NOTE — Transfer of Care (Signed)
 Immediate Anesthesia Transfer of Care Note  Patient: Jennifer Carrillo  Procedure(s) Performed: ENTEROSCOPY INJECTION, SUBMUCOSAL EGD, WITH ARGON PLASMA COAGULATION  Patient Location: PACU  Anesthesia Type:General  Level of Consciousness: awake, alert , and oriented  Airway & Oxygen Therapy: Patient Spontanous Breathing  Post-op Assessment: Report given to RN and Post -op Vital signs reviewed and stable  Post vital signs: Reviewed and stable  Last Vitals:  Vitals Value Taken Time  BP 135/49 07/24/23 09:59  Temp    Pulse 75 07/24/23 10:00  Resp 34 07/24/23 10:00  SpO2 96 % 07/24/23 10:00  Vitals shown include unfiled device data.  Last Pain:  Vitals:   07/24/23 0806  TempSrc: Temporal  PainSc: 0-No pain         Complications: No notable events documented.

## 2023-07-24 NOTE — Anesthesia Preprocedure Evaluation (Signed)
 Anesthesia Evaluation  Patient identified by MRN, date of birth, ID band Patient confused    Reviewed: Allergy & Precautions, NPO status , Patient's Chart, lab work & pertinent test results  History of Anesthesia Complications Negative for: history of anesthetic complications  Airway Mallampati: III  TM Distance: >3 FB Neck ROM: full    Dental  (+) Chipped, Dental Advidsory Given   Pulmonary shortness of breath and with exertion, asthma , sleep apnea , COPD,  COPD inhaler, neg recent URI, former smoker   Pulmonary exam normal        Cardiovascular hypertension, (-) angina + CAD, + Past MI and + Peripheral Vascular Disease  (-) Cardiac Stents and (-) CABG Normal cardiovascular exam(-) dysrhythmias (-) Valvular Problems/Murmurs     Neuro/Psych CVA, Residual Symptoms  negative psych ROS   GI/Hepatic Neg liver ROS,GERD  ,,  Endo/Other  negative endocrine ROSdiabetes    Renal/GU ESRF and DialysisRenal disease  negative genitourinary   Musculoskeletal   Abdominal   Peds  Hematology  (+) Blood dyscrasia, anemia   Anesthesia Other Findings Past Medical History: No date: Arthritis No date: Asthma No date: Calf pain 2016: Cancer (HCC)     Comment:  left breast No date: COPD (chronic obstructive pulmonary disease) (HCC) No date: Diabetes (HCC) No date: Discoid lupus No date: Gastro-esophageal reflux No date: Glaucoma No date: Hyperlipemia No date: Hypertension No date: Iron  deficiency anemia No date: Joint pain No date: Leg swelling No date: Osteoarthritis No date: PVD (peripheral vascular disease) (HCC) No date: Sinus problem No date: Sleep apnea     Comment:  No CPAP/ Can't tolerate 1987: Stroke (HCC)  Past Surgical History: No date: BREAST SURGERY; Left     Comment:  left lumpectomy 06/25/2018: CAROTID ANGIOGRAPHY; Left     Comment:  Procedure: CAROTID ANGIOGRAPHY, possible intervention;                 Surgeon: Jama Cordella MATSU, MD;  Location: [facility] INVASIVE              CV LAB;  Service: Cardiovascular;  Laterality: Left; No date: CAROTID ENDARTERECTOMY; Right 06/25/2018: CAROTID PTA/STENT INTERVENTION; Left     Comment:  Procedure: CAROTID PTA/STENT INTERVENTION;  Surgeon:               Jama Cordella MATSU, MD;  Location: [facility] INVASIVE CV LAB;               Service: Cardiovascular;  Laterality: Left; 2013: CATARACT EXTRACTION; Right No date: CORONARY STENT PLACEMENT     Comment:  L. L. E. 12/29/2020: DIALYSIS/PERMA CATHETER INSERTION; N/A     Comment:  Procedure: DIALYSIS/PERMA CATHETER INSERTION;  Surgeon:               Marea Selinda RAMAN, MD;  Location: [facility] INVASIVE CV LAB;                Service: Cardiovascular;  Laterality: N/A; 12/14/2021: DIALYSIS/PERMA CATHETER INSERTION; N/A     Comment:  Procedure: DIALYSIS/PERMA CATHETER INSERTION;  Surgeon:               Jama Cordella MATSU, MD;  Location: [facility] INVASIVE CV LAB;               Service: Cardiovascular;  Laterality: N/A; No date: EYE SURGERY; Bilateral     Comment:  cataract 05/06/2018: LEFT HEART CATH AND CORONARY ANGIOGRAPHY; Left     Comment:  Procedure: LEFT HEART CATH AND CORONARY ANGIOGRAPHY;  Surgeon: Hester Wolm PARAS, MD;  Location: [facility] INVASIVE               CV LAB;  Service: Cardiovascular;  Laterality: Left; 01/22/2018: LOWER EXTREMITY ANGIOGRAPHY; Right     Comment:  Procedure: LOWER EXTREMITY ANGIOGRAPHY;  Surgeon:               Jama Cordella MATSU, MD;  Location: [facility] INVASIVE CV LAB;               Service: Cardiovascular;  Laterality: Right; 02/26/2018: RENAL ANGIOGRAPHY; Left     Comment:  Procedure: RENAL ANGIOGRAPHY;  Surgeon: Jama Cordella MATSU, MD;  Location: [facility] INVASIVE CV LAB;  Service:               Cardiovascular;  Laterality: Left; No date: STENT PLACEMENT VASCULAR ([facility] HX); Left     Comment:  stent placement on LLE No date: TUBAL LIGATION  BMI    Body Mass Index: 28.36  kg/m      Reproductive/Obstetrics negative OB ROS                              Anesthesia Physical Anesthesia Plan  ASA: 3  Anesthesia Plan: General   Post-op Pain Management: Minimal or no pain anticipated   Induction: Intravenous  PONV Risk Score and Plan: 3 and Propofol  infusion and TIVA  Airway Management Planned: Nasal Cannula and Natural Airway  Additional Equipment: None  Intra-op Plan:   Post-operative Plan:   Informed Consent: I have reviewed the patients History and Physical, chart, labs and discussed the procedure including the risks, benefits and alternatives for the proposed anesthesia with the patient or authorized representative who has indicated his/her understanding and acceptance.     Dental advisory given and Consent reviewed with POA  Plan Discussed with: CRNA and Surgeon  Anesthesia Plan Comments: (Discussed risks of anesthesia with patient and husband, including possibility of difficulty with spontaneous ventilation under anesthesia necessitating airway intervention, PONV, and rare risks such as cardiac or respiratory or neurological events, and allergic reactions. Discussed the role of CRNA in patient's perioperative care. Patient's husband understands and consents.)        Anesthesia Quick Evaluation

## 2023-07-24 NOTE — Anesthesia Postprocedure Evaluation (Signed)
 Anesthesia Post Note  Patient: QUINTELLA MURA  Procedure(s) Performed: ENTEROSCOPY INJECTION, SUBMUCOSAL EGD, WITH ARGON PLASMA COAGULATION  Patient location during evaluation: Endoscopy Anesthesia Type: General Level of consciousness: awake and alert Pain management: pain level controlled Vital Signs Assessment: post-procedure vital signs reviewed and stable Respiratory status: spontaneous breathing, nonlabored ventilation, respiratory function stable and patient connected to nasal cannula oxygen Cardiovascular status: blood pressure returned to baseline and stable Postop Assessment: no apparent nausea or vomiting Anesthetic complications: no   No notable events documented.   Last Vitals:  Vitals:   07/24/23 0806 07/24/23 0958  BP: (!) 139/51 (!) 135/49  Pulse: 63 75  Resp: 16 16  Temp: (!) 35.8 C (!) 35.9 C  SpO2: 100% 100%    Last Pain:  Vitals:   07/24/23 1028  TempSrc:   PainSc: 0-No pain                 Prentice Murphy

## 2023-07-25 DIAGNOSIS — N186 End stage renal disease: Secondary | ICD-10-CM | POA: Diagnosis not present

## 2023-07-25 DIAGNOSIS — Z992 Dependence on renal dialysis: Secondary | ICD-10-CM | POA: Diagnosis not present

## 2023-07-27 DIAGNOSIS — Z992 Dependence on renal dialysis: Secondary | ICD-10-CM | POA: Diagnosis not present

## 2023-07-27 DIAGNOSIS — N186 End stage renal disease: Secondary | ICD-10-CM | POA: Diagnosis not present

## 2023-07-29 ENCOUNTER — Inpatient Hospital Stay

## 2023-07-29 ENCOUNTER — Ambulatory Visit

## 2023-07-29 ENCOUNTER — Other Ambulatory Visit

## 2023-07-29 ENCOUNTER — Other Ambulatory Visit: Payer: Self-pay | Admitting: *Deleted

## 2023-07-29 VITALS — BP 143/50 | HR 70 | Temp 96.0°F | Resp 19

## 2023-07-29 DIAGNOSIS — D631 Anemia in chronic kidney disease: Secondary | ICD-10-CM | POA: Diagnosis not present

## 2023-07-29 DIAGNOSIS — D649 Anemia, unspecified: Secondary | ICD-10-CM

## 2023-07-29 DIAGNOSIS — Z853 Personal history of malignant neoplasm of breast: Secondary | ICD-10-CM | POA: Diagnosis not present

## 2023-07-29 DIAGNOSIS — D509 Iron deficiency anemia, unspecified: Secondary | ICD-10-CM

## 2023-07-29 DIAGNOSIS — E785 Hyperlipidemia, unspecified: Secondary | ICD-10-CM | POA: Diagnosis not present

## 2023-07-29 DIAGNOSIS — Z8249 Family history of ischemic heart disease and other diseases of the circulatory system: Secondary | ICD-10-CM | POA: Diagnosis not present

## 2023-07-29 DIAGNOSIS — Z806 Family history of leukemia: Secondary | ICD-10-CM | POA: Diagnosis not present

## 2023-07-29 DIAGNOSIS — Z833 Family history of diabetes mellitus: Secondary | ICD-10-CM | POA: Diagnosis not present

## 2023-07-29 DIAGNOSIS — F0390 Unspecified dementia without behavioral disturbance: Secondary | ICD-10-CM | POA: Diagnosis not present

## 2023-07-29 DIAGNOSIS — J4489 Other specified chronic obstructive pulmonary disease: Secondary | ICD-10-CM | POA: Diagnosis not present

## 2023-07-29 DIAGNOSIS — K31811 Angiodysplasia of stomach and duodenum with bleeding: Secondary | ICD-10-CM | POA: Diagnosis not present

## 2023-07-29 DIAGNOSIS — R269 Unspecified abnormalities of gait and mobility: Secondary | ICD-10-CM | POA: Diagnosis not present

## 2023-07-29 DIAGNOSIS — Z87891 Personal history of nicotine dependence: Secondary | ICD-10-CM | POA: Diagnosis not present

## 2023-07-29 DIAGNOSIS — Z8673 Personal history of transient ischemic attack (TIA), and cerebral infarction without residual deficits: Secondary | ICD-10-CM | POA: Diagnosis not present

## 2023-07-29 DIAGNOSIS — E1122 Type 2 diabetes mellitus with diabetic chronic kidney disease: Secondary | ICD-10-CM | POA: Diagnosis not present

## 2023-07-29 DIAGNOSIS — N186 End stage renal disease: Secondary | ICD-10-CM | POA: Diagnosis not present

## 2023-07-29 DIAGNOSIS — Z992 Dependence on renal dialysis: Secondary | ICD-10-CM | POA: Diagnosis not present

## 2023-07-29 DIAGNOSIS — I120 Hypertensive chronic kidney disease with stage 5 chronic kidney disease or end stage renal disease: Secondary | ICD-10-CM | POA: Diagnosis not present

## 2023-07-29 DIAGNOSIS — Z888 Allergy status to other drugs, medicaments and biological substances status: Secondary | ICD-10-CM | POA: Diagnosis not present

## 2023-07-29 DIAGNOSIS — D500 Iron deficiency anemia secondary to blood loss (chronic): Secondary | ICD-10-CM | POA: Diagnosis not present

## 2023-07-29 DIAGNOSIS — G4733 Obstructive sleep apnea (adult) (pediatric): Secondary | ICD-10-CM | POA: Diagnosis not present

## 2023-07-29 DIAGNOSIS — Z79899 Other long term (current) drug therapy: Secondary | ICD-10-CM | POA: Diagnosis not present

## 2023-07-29 DIAGNOSIS — L930 Discoid lupus erythematosus: Secondary | ICD-10-CM | POA: Diagnosis not present

## 2023-07-29 DIAGNOSIS — M1990 Unspecified osteoarthritis, unspecified site: Secondary | ICD-10-CM | POA: Diagnosis not present

## 2023-07-29 DIAGNOSIS — Z801 Family history of malignant neoplasm of trachea, bronchus and lung: Secondary | ICD-10-CM | POA: Diagnosis not present

## 2023-07-29 LAB — HEMOGLOBIN AND HEMATOCRIT (CANCER CENTER ONLY)
HCT: 24.8 % — ABNORMAL LOW (ref 36.0–46.0)
Hemoglobin: 7.6 g/dL — ABNORMAL LOW (ref 12.0–15.0)

## 2023-07-29 LAB — SAMPLE TO BLOOD BANK

## 2023-07-29 LAB — PREPARE RBC (CROSSMATCH)

## 2023-07-29 MED ORDER — IRON SUCROSE 20 MG/ML IV SOLN
200.0000 mg | Freq: Once | INTRAVENOUS | Status: AC
  Administered 2023-07-29: 200 mg via INTRAVENOUS
  Filled 2023-07-29: qty 10

## 2023-07-30 ENCOUNTER — Inpatient Hospital Stay

## 2023-07-30 ENCOUNTER — Encounter

## 2023-07-30 DIAGNOSIS — I120 Hypertensive chronic kidney disease with stage 5 chronic kidney disease or end stage renal disease: Secondary | ICD-10-CM | POA: Diagnosis not present

## 2023-07-30 DIAGNOSIS — D500 Iron deficiency anemia secondary to blood loss (chronic): Secondary | ICD-10-CM | POA: Diagnosis not present

## 2023-07-30 DIAGNOSIS — Z853 Personal history of malignant neoplasm of breast: Secondary | ICD-10-CM | POA: Diagnosis not present

## 2023-07-30 DIAGNOSIS — Z79899 Other long term (current) drug therapy: Secondary | ICD-10-CM | POA: Diagnosis not present

## 2023-07-30 DIAGNOSIS — Z8673 Personal history of transient ischemic attack (TIA), and cerebral infarction without residual deficits: Secondary | ICD-10-CM | POA: Diagnosis not present

## 2023-07-30 DIAGNOSIS — Z806 Family history of leukemia: Secondary | ICD-10-CM | POA: Diagnosis not present

## 2023-07-30 DIAGNOSIS — Z888 Allergy status to other drugs, medicaments and biological substances status: Secondary | ICD-10-CM | POA: Diagnosis not present

## 2023-07-30 DIAGNOSIS — M1990 Unspecified osteoarthritis, unspecified site: Secondary | ICD-10-CM | POA: Diagnosis not present

## 2023-07-30 DIAGNOSIS — Z992 Dependence on renal dialysis: Secondary | ICD-10-CM | POA: Diagnosis not present

## 2023-07-30 DIAGNOSIS — K31811 Angiodysplasia of stomach and duodenum with bleeding: Secondary | ICD-10-CM | POA: Diagnosis not present

## 2023-07-30 DIAGNOSIS — N186 End stage renal disease: Secondary | ICD-10-CM | POA: Diagnosis not present

## 2023-07-30 DIAGNOSIS — L930 Discoid lupus erythematosus: Secondary | ICD-10-CM | POA: Diagnosis not present

## 2023-07-30 DIAGNOSIS — D631 Anemia in chronic kidney disease: Secondary | ICD-10-CM | POA: Diagnosis not present

## 2023-07-30 DIAGNOSIS — F0390 Unspecified dementia without behavioral disturbance: Secondary | ICD-10-CM | POA: Diagnosis not present

## 2023-07-30 DIAGNOSIS — G4733 Obstructive sleep apnea (adult) (pediatric): Secondary | ICD-10-CM | POA: Diagnosis not present

## 2023-07-30 DIAGNOSIS — D649 Anemia, unspecified: Secondary | ICD-10-CM

## 2023-07-30 DIAGNOSIS — Z833 Family history of diabetes mellitus: Secondary | ICD-10-CM | POA: Diagnosis not present

## 2023-07-30 DIAGNOSIS — D509 Iron deficiency anemia, unspecified: Secondary | ICD-10-CM

## 2023-07-30 DIAGNOSIS — Z8249 Family history of ischemic heart disease and other diseases of the circulatory system: Secondary | ICD-10-CM | POA: Diagnosis not present

## 2023-07-30 DIAGNOSIS — Z801 Family history of malignant neoplasm of trachea, bronchus and lung: Secondary | ICD-10-CM | POA: Diagnosis not present

## 2023-07-30 DIAGNOSIS — E1122 Type 2 diabetes mellitus with diabetic chronic kidney disease: Secondary | ICD-10-CM | POA: Diagnosis not present

## 2023-07-30 DIAGNOSIS — E785 Hyperlipidemia, unspecified: Secondary | ICD-10-CM | POA: Diagnosis not present

## 2023-07-30 DIAGNOSIS — J4489 Other specified chronic obstructive pulmonary disease: Secondary | ICD-10-CM | POA: Diagnosis not present

## 2023-07-30 DIAGNOSIS — Z87891 Personal history of nicotine dependence: Secondary | ICD-10-CM | POA: Diagnosis not present

## 2023-07-30 MED ORDER — DIPHENHYDRAMINE HCL 25 MG PO CAPS
25.0000 mg | ORAL_CAPSULE | Freq: Once | ORAL | Status: AC
  Administered 2023-07-30: 25 mg via ORAL
  Filled 2023-07-30: qty 1

## 2023-07-30 MED ORDER — ACETAMINOPHEN 325 MG PO TABS
650.0000 mg | ORAL_TABLET | Freq: Once | ORAL | Status: AC
  Administered 2023-07-30: 650 mg via ORAL
  Filled 2023-07-30: qty 2

## 2023-07-30 MED ORDER — SODIUM CHLORIDE 0.9% IV SOLUTION
250.0000 mL | INTRAVENOUS | Status: DC
  Administered 2023-07-30: 250 mL via INTRAVENOUS
  Filled 2023-07-30: qty 250

## 2023-07-30 NOTE — Patient Instructions (Signed)
 Blood Transfusion, Adult A blood transfusion is a procedure in which you receive blood or a type of blood cell (blood component) through an IV. You may need a blood transfusion when you have a low blood count, which is a low number of any blood cell. This may result from a bleeding disorder, illness, injury, or surgery. The blood may come from a donor, or you may be able to have your own blood collected and stored (autologous blood donation) before a planned surgery. The blood given in a transfusion may be made up of different blood components. You may receive: Red blood cells. These carry oxygen to the cells in the body. Platelets. These help your blood to clot. Plasma. This is the liquid part of your blood. It carries proteins and other substances throughout the body. White blood cells. These help you fight infections. If you have hemophilia or another clotting disorder, you may also receive other types of blood products. Depending on the type of blood product, this procedure may take 1-4 hours to complete. Tell a health care provider about: Any bleeding problems you have. Any previous reactions you have had during a blood transfusion. Any allergies you have. All medicines you are taking, including vitamins, herbs, eye drops, creams, and over-the-counter medicines. Any surgeries you have had. Any medical conditions you have. Whether you are pregnant or may be pregnant. What are the risks? Talk with your health care provider about risks. The most common problems include: A mild allergic reaction, such as red, swollen areas of skin (hives) and itching. Fever or chills. This may be the body's response to new blood cells received. This may occur during or up to 4 hours after the transfusion. More serious problems may include: A serious allergic reaction that causes difficulty breathing or swelling around the face and lips. Transfusion-associated circulatory overload (TACO), or too much fluid in  the lungs. This may cause breathing problems. Transfusion-related acute lung injury (TRALI), which causes breathing difficulty and low oxygen in the blood. This can occur within hours of the transfusion or several days later. Iron overload. This can happen after receiving many blood transfusions over a period of time. Infection or virus being transmitted. This is rare because donated blood is carefully tested before it is given. Hemolytic transfusion reaction. This is rare. It happens when the body's defense system (immune system)tries to attack the new blood cells. Symptoms may include fever, chills, nausea, low blood pressure, and low back or chest pain. Transfusion-associated graft-versus-host disease (TAGVHD). This is rare. It happens when donated cells attack the body's healthy tissues. What happens before the procedure? You will have a blood test to check your blood type. This test is done to know what kind of blood your body will accept and to match it to the donor blood. If you are going to have a planned surgery, you may be able to do an autologous blood donation. This may be done in case you need to have a transfusion. You will have your temperature, blood pressure, and pulse checked before the transfusion. If you have had an allergic reaction to a transfusion in the past, you may be given medicine to help prevent a reaction. This medicine may be given to you by mouth (orally) or through an IV. What happens during the procedure?  An IV will be inserted into one of your veins. The bag of blood will be attached to your IV. The blood will then enter through your vein. Your temperature, blood pressure,  and pulse will be monitored during the transfusion. This monitoring is done to detect early signs of a transfusion reaction. Tell your nurse right away if you have any of these symptoms during the transfusion: Shortness of breath or trouble breathing. Chest or back pain. Fever or  chills. Itching or hives. If you have any signs or symptoms of a reaction, your transfusion will be stopped and you may be given medicine. When the transfusion is complete, your IV will be removed. Pressure may be applied to the IV site for a few minutes. A bandage (dressing)will be applied. The procedure may vary among health care providers and hospitals. What happens after the procedure? Your temperature, blood pressure, pulse, breathing rate, and blood oxygen level will be monitored until you leave the hospital or clinic. Your blood may be tested to see how you have responded to the transfusion. You may be warmed with fluids or blankets to maintain a normal body temperature. If you receive your blood transfusion in an outpatient setting, you will be told whom to contact to report any reactions. Where to find more information Visit the [employer]: redcross.org Summary A blood transfusion is a procedure in which you receive blood or a type of blood cell (blood component) through an IV. The blood given in a transfusion may be made up of different blood components. You may receive red blood cells, platelets, plasma, or white blood cells depending on the condition treated. Your temperature, blood pressure, and pulse will be monitored before, during, and after the transfusion. After the transfusion, your blood may be tested to see how your body has responded. This information is not intended to replace advice given to you by your health care provider. Make sure you discuss any questions you have with your health care provider. Document Revised: 03/24/2021 Document Reviewed: 03/24/2021 Elsevier Patient Education  2024 [employer].

## 2023-07-31 LAB — TYPE AND SCREEN
ABO/RH(D): O POS
Antibody Screen: NEGATIVE
Unit division: 0

## 2023-07-31 LAB — BPAM RBC
Blood Product Expiration Date: 202508242359
ISSUE DATE / TIME: 202507221324
Unit Type and Rh: 5100

## 2023-08-01 DIAGNOSIS — Z992 Dependence on renal dialysis: Secondary | ICD-10-CM | POA: Diagnosis not present

## 2023-08-01 DIAGNOSIS — N186 End stage renal disease: Secondary | ICD-10-CM | POA: Diagnosis not present

## 2023-08-03 DIAGNOSIS — N186 End stage renal disease: Secondary | ICD-10-CM | POA: Diagnosis not present

## 2023-08-03 DIAGNOSIS — Z992 Dependence on renal dialysis: Secondary | ICD-10-CM | POA: Diagnosis not present

## 2023-08-06 DIAGNOSIS — Z992 Dependence on renal dialysis: Secondary | ICD-10-CM | POA: Diagnosis not present

## 2023-08-06 DIAGNOSIS — N186 End stage renal disease: Secondary | ICD-10-CM | POA: Diagnosis not present

## 2023-08-06 DIAGNOSIS — D631 Anemia in chronic kidney disease: Secondary | ICD-10-CM | POA: Diagnosis not present

## 2023-08-08 DIAGNOSIS — Z992 Dependence on renal dialysis: Secondary | ICD-10-CM | POA: Diagnosis not present

## 2023-08-08 DIAGNOSIS — N186 End stage renal disease: Secondary | ICD-10-CM | POA: Diagnosis not present

## 2023-08-09 ENCOUNTER — Ambulatory Visit: Admitting: Internal Medicine

## 2023-08-09 ENCOUNTER — Other Ambulatory Visit

## 2023-08-09 ENCOUNTER — Inpatient Hospital Stay

## 2023-08-09 ENCOUNTER — Inpatient Hospital Stay: Admitting: Nurse Practitioner

## 2023-08-10 DIAGNOSIS — Z992 Dependence on renal dialysis: Secondary | ICD-10-CM | POA: Diagnosis not present

## 2023-08-10 DIAGNOSIS — N186 End stage renal disease: Secondary | ICD-10-CM | POA: Diagnosis not present

## 2023-08-10 DIAGNOSIS — Z23 Encounter for immunization: Secondary | ICD-10-CM | POA: Diagnosis not present

## 2023-08-12 ENCOUNTER — Ambulatory Visit
Admission: RE | Admit: 2023-08-12 | Discharge: 2023-08-12 | Disposition: A | Discharge status: Home / Self Care | Source: Ambulatory Visit | Attending: Nephrology

## 2023-08-12 VITALS — BP 139/49 | HR 69 | Temp 98.1°F | Resp 17 | Ht 67.0 in | Wt 160.0 lb

## 2023-08-12 DIAGNOSIS — D631 Anemia in chronic kidney disease: Secondary | ICD-10-CM | POA: Diagnosis not present

## 2023-08-12 DIAGNOSIS — N189 Chronic kidney disease, unspecified: Secondary | ICD-10-CM | POA: Diagnosis not present

## 2023-08-12 DIAGNOSIS — D62 Acute posthemorrhagic anemia: Secondary | ICD-10-CM

## 2023-08-12 LAB — PREPARE RBC (CROSSMATCH)

## 2023-08-12 LAB — HEMOGLOBIN: Hemoglobin: 6.5 g/dL — ABNORMAL LOW (ref 12.0–15.0)

## 2023-08-12 MED ORDER — SODIUM CHLORIDE 0.9% IV SOLUTION: Freq: Once | INTRAVENOUS | Status: DC

## 2023-08-13 DIAGNOSIS — Z23 Encounter for immunization: Secondary | ICD-10-CM | POA: Diagnosis not present

## 2023-08-13 DIAGNOSIS — N186 End stage renal disease: Secondary | ICD-10-CM | POA: Diagnosis not present

## 2023-08-13 DIAGNOSIS — Z992 Dependence on renal dialysis: Secondary | ICD-10-CM | POA: Diagnosis not present

## 2023-08-13 LAB — TYPE AND SCREEN
ABO/RH(D): O POS
Antibody Screen: NEGATIVE
Unit division: 0

## 2023-08-13 LAB — BPAM RBC
Blood Product Expiration Date: 202508302359
ISSUE DATE / TIME: 202508040937
Unit Type and Rh: 5100

## 2023-08-15 DIAGNOSIS — Z23 Encounter for immunization: Secondary | ICD-10-CM | POA: Diagnosis not present

## 2023-08-15 DIAGNOSIS — N186 End stage renal disease: Secondary | ICD-10-CM | POA: Diagnosis not present

## 2023-08-15 DIAGNOSIS — Z992 Dependence on renal dialysis: Secondary | ICD-10-CM | POA: Diagnosis not present

## 2023-08-16 ENCOUNTER — Encounter: Payer: Self-pay | Admitting: Nurse Practitioner

## 2023-08-16 ENCOUNTER — Inpatient Hospital Stay: Attending: Nurse Practitioner

## 2023-08-16 ENCOUNTER — Inpatient Hospital Stay

## 2023-08-16 ENCOUNTER — Inpatient Hospital Stay (HOSPITAL_BASED_OUTPATIENT_CLINIC_OR_DEPARTMENT_OTHER): Admitting: Nurse Practitioner

## 2023-08-16 VITALS — BP 107/88 | HR 67 | Temp 97.5°F | Resp 16 | Wt 167.0 lb

## 2023-08-16 VITALS — BP 149/37 | HR 61

## 2023-08-16 DIAGNOSIS — D500 Iron deficiency anemia secondary to blood loss (chronic): Secondary | ICD-10-CM

## 2023-08-16 DIAGNOSIS — K31811 Angiodysplasia of stomach and duodenum with bleeding: Secondary | ICD-10-CM | POA: Insufficient documentation

## 2023-08-16 DIAGNOSIS — C50112 Malignant neoplasm of central portion of left female breast: Secondary | ICD-10-CM | POA: Insufficient documentation

## 2023-08-16 DIAGNOSIS — D631 Anemia in chronic kidney disease: Secondary | ICD-10-CM

## 2023-08-16 DIAGNOSIS — J4489 Other specified chronic obstructive pulmonary disease: Secondary | ICD-10-CM | POA: Diagnosis not present

## 2023-08-16 DIAGNOSIS — D649 Anemia, unspecified: Secondary | ICD-10-CM | POA: Diagnosis not present

## 2023-08-16 DIAGNOSIS — E785 Hyperlipidemia, unspecified: Secondary | ICD-10-CM | POA: Insufficient documentation

## 2023-08-16 DIAGNOSIS — Z833 Family history of diabetes mellitus: Secondary | ICD-10-CM | POA: Insufficient documentation

## 2023-08-16 DIAGNOSIS — Z801 Family history of malignant neoplasm of trachea, bronchus and lung: Secondary | ICD-10-CM | POA: Insufficient documentation

## 2023-08-16 DIAGNOSIS — Z170 Estrogen receptor positive status [ER+]: Secondary | ICD-10-CM | POA: Diagnosis not present

## 2023-08-16 DIAGNOSIS — Z1732 Human epidermal growth factor receptor 2 negative status: Secondary | ICD-10-CM | POA: Insufficient documentation

## 2023-08-16 DIAGNOSIS — Z8249 Family history of ischemic heart disease and other diseases of the circulatory system: Secondary | ICD-10-CM | POA: Insufficient documentation

## 2023-08-16 DIAGNOSIS — Z8673 Personal history of transient ischemic attack (TIA), and cerebral infarction without residual deficits: Secondary | ICD-10-CM | POA: Diagnosis not present

## 2023-08-16 DIAGNOSIS — Z7982 Long term (current) use of aspirin: Secondary | ICD-10-CM | POA: Insufficient documentation

## 2023-08-16 DIAGNOSIS — Z79899 Other long term (current) drug therapy: Secondary | ICD-10-CM | POA: Insufficient documentation

## 2023-08-16 DIAGNOSIS — G4733 Obstructive sleep apnea (adult) (pediatric): Secondary | ICD-10-CM | POA: Insufficient documentation

## 2023-08-16 DIAGNOSIS — N186 End stage renal disease: Secondary | ICD-10-CM | POA: Diagnosis not present

## 2023-08-16 DIAGNOSIS — N184 Chronic kidney disease, stage 4 (severe): Secondary | ICD-10-CM | POA: Diagnosis not present

## 2023-08-16 DIAGNOSIS — L930 Discoid lupus erythematosus: Secondary | ICD-10-CM | POA: Insufficient documentation

## 2023-08-16 DIAGNOSIS — E1122 Type 2 diabetes mellitus with diabetic chronic kidney disease: Secondary | ICD-10-CM | POA: Diagnosis not present

## 2023-08-16 DIAGNOSIS — D509 Iron deficiency anemia, unspecified: Secondary | ICD-10-CM

## 2023-08-16 DIAGNOSIS — Z79811 Long term (current) use of aromatase inhibitors: Secondary | ICD-10-CM | POA: Insufficient documentation

## 2023-08-16 DIAGNOSIS — M1990 Unspecified osteoarthritis, unspecified site: Secondary | ICD-10-CM | POA: Diagnosis not present

## 2023-08-16 DIAGNOSIS — I120 Hypertensive chronic kidney disease with stage 5 chronic kidney disease or end stage renal disease: Secondary | ICD-10-CM | POA: Diagnosis not present

## 2023-08-16 DIAGNOSIS — Z87891 Personal history of nicotine dependence: Secondary | ICD-10-CM | POA: Diagnosis not present

## 2023-08-16 DIAGNOSIS — Z992 Dependence on renal dialysis: Secondary | ICD-10-CM | POA: Insufficient documentation

## 2023-08-16 DIAGNOSIS — Z806 Family history of leukemia: Secondary | ICD-10-CM | POA: Insufficient documentation

## 2023-08-16 DIAGNOSIS — Z888 Allergy status to other drugs, medicaments and biological substances status: Secondary | ICD-10-CM | POA: Insufficient documentation

## 2023-08-16 LAB — HEMOGLOBIN AND HEMATOCRIT (CANCER CENTER ONLY)
HCT: 26.6 % — ABNORMAL LOW (ref 36.0–46.0)
Hemoglobin: 8.1 g/dL — ABNORMAL LOW (ref 12.0–15.0)

## 2023-08-16 LAB — SAMPLE TO BLOOD BANK

## 2023-08-16 MED ORDER — IRON SUCROSE 20 MG/ML IV SOLN
200.0000 mg | Freq: Once | INTRAVENOUS | Status: AC
  Administered 2023-08-16: 200 mg via INTRAVENOUS
  Filled 2023-08-16: qty 10

## 2023-08-16 NOTE — Addendum Note (Signed)
 Addended by: BETHENE ALVIN RAMAN on: 08/16/2023 04:16 PM   Modules accepted: Orders

## 2023-08-16 NOTE — Progress Notes (Signed)
 [facility]  [phone] Fax:([phone]) [phone]  ID: Naomie LITTIE Blush OB: Mar 28, 1933  MR#: [id number]  RDW#:[id number]  Patient Care Team: Kristina Tinnie [name] as PCP - General (Physician Assistant) Nicholaus Sherlean LITTIE, [facility] (Inactive) as Pharmacist (Pharmacist) Rennie Cindy SAUNDERS, MD as Consulting Physician (Oncology)  REFERRING PROVIDER: Tinnie Kristina  REASON FOR REFERRAL: anemia of chronic disease  HPI: VALTA DILLON is a 89 y.o. female with past medical history of hypertension, hyperlipidemia, IDA, COPD, diabetes, left breast cancer, OSA not on CPAP, stroke, ESRD on HD was referred to hematology for normocytic anemia.  Patient has history of ESRD on HD Tuesday Thursday and Saturday regimen.  Follows with Dr. Dennise as outpatient.  She has been getting EPO injection and iron  infusions per nephrology.  Labs from June 2024 showed hemoglobin of 8.1, WBC 6.3 and platelet 300.  Iron  32, saturation 12% and ferritin of 273, B12 523, folate 15.8, TSH 2.25.  Patient has history of left breast cancer.  Oncology history obtained from Dr. Evalene [facility] heme-onc.  Last seen in March 2021.  Oncology History Overview Note  11/10/15: Mammogram with 2 cm left breast abnormality; US  confirmed. Axillary negative.  12/07/15: Bx : Breast, left, 6 o'clock, core biopsy  - Invasive ductal carcinoma - Nottingham combined histologic grade: 1 Tubule formation: 2 Nuclear grade: 2 Mitotic score: 1 - Invasive carcinoma measures approximately 9 mm in this core biopsy specimen - Microcalcifications are associated with invasive carcinoma - ER 100%; PR 1-2%; Her 2+, FISH negative   12/20/15: Partial Mastectomy A: Breast, left savi guided partial mastectomy -Invasive ductal carcinoma with associated ductal carcinoma in situ (DCIS) (see synoptic) -Invasive carcinoma measures 14.0 mm in greatest dimension -Margin status in part A only (correlate with additional  parts to determine final margin status)      - Adenocarcinoma is greater than 2 mm from all surgical margins      - DCIS is focally positive at the posterior black-inked surgical margin; DCIS is greater than 2 mm from all other surgical margins -Ancillary studies previously reported on       - Estrogen receptor: Positive, 91-100%, strong      - Progesterone receptor: Margins positive, 1-10%, moderate      - HER2/Neu: 2+ equivocal FISH results HER2/Neu: Not amplified -See comment   B: Breast, left, posterior margin, excision - Ductal carcinoma in-situ present, 6.0 mm from the black-inked surgical margin - Negative for invasive carcinoma   C: Breast, left, medial margin, excision - Negative for in-situ and invasive carcinoma   D: Breast, left, inferior margin, excision - Negative for in-situ and invasive carcinoma   E: Breast, left, lateral margin, excision - Negative for in-situ and invasive carcinoma   F: Breast, left, superior margin, excision - Ductal carcinoma in-situ is 1.7 mm from the blue-inked surgical margin - Negative for invasive carcinoma  Malignant neoplasm of left female breast (CMS-HCC)  12/16/2015 Initial Diagnosis  Malignant neoplasm of breast in female, estrogen receptor positive (RAF-HCC)   On anastrozole  1 mg daily  2 sons, 1 daughter (2 died)    INTERVAL HISTORY: PRAKRITI CARIGNAN is a 89 y.o. female currently receiving hemodialysis with Dr. Dennise who returns to clinic for possible transfusion.  Patient previously has a history of melena and AVMs and underwent an enteroscopy with Dr. Unk on 07/24/2023.  She mostly sent really received a transfusion on 08/12/2023.  She says she feels better today and more energy. I spoke to her  husband by phone who provides history.    REVIEW OF SYSTEMS:   Review of Systems  Unable to perform ROS: Dementia  Constitutional:  Positive for malaise/fatigue.  HENT:  Negative for nosebleeds.   Respiratory:  Negative for  shortness of breath.   Cardiovascular:  Negative for chest pain.  Gastrointestinal:  Negative for blood in stool and melena.  Genitourinary:  Negative for dysuria and urgency.  Neurological:  Negative for weakness.  Psychiatric/Behavioral:  Positive for memory loss.   As per HPI. Otherwise, a complete review of systems is negative.  PAST MEDICAL HISTORY: Past Medical History:  Diagnosis Date   Arthritis    Asthma    Calf pain    Cancer (HCC) 2016   left breast   COPD (chronic obstructive pulmonary disease) (HCC)    Diabetes (HCC)    Discoid lupus    Gastro-esophageal reflux    Glaucoma    Hyperlipemia    Hypertension    Iron  deficiency anemia    Joint pain    Leg swelling    Osteoarthritis    PVD (peripheral vascular disease) (HCC)    Sinus problem    Sleep apnea    No CPAP/ Can't tolerate   Stroke (HCC) 1987    PAST SURGICAL HISTORY: Past Surgical History:  Procedure Laterality Date   BREAST SURGERY Left    left lumpectomy   CAROTID ANGIOGRAPHY Left 06/25/2018   Procedure: CAROTID ANGIOGRAPHY, possible intervention;  Surgeon: Jama Cordella MATSU, MD;  Location: [facility] INVASIVE CV LAB;  Service: Cardiovascular;  Laterality: Left;   CAROTID ENDARTERECTOMY Right    CAROTID PTA/STENT INTERVENTION Left 06/25/2018   Procedure: CAROTID PTA/STENT INTERVENTION;  Surgeon: Jama Cordella MATSU, MD;  Location: [facility] INVASIVE CV LAB;  Service: Cardiovascular;  Laterality: Left;   CATARACT EXTRACTION Right 2013   CORONARY STENT PLACEMENT     L. L. E.   DIALYSIS/PERMA CATHETER INSERTION N/A 12/29/2020   Procedure: DIALYSIS/PERMA CATHETER INSERTION;  Surgeon: Marea Selinda RAMAN, MD;  Location: [facility] INVASIVE CV LAB;  Service: Cardiovascular;  Laterality: N/A;   DIALYSIS/PERMA CATHETER INSERTION N/A 12/14/2021   Procedure: DIALYSIS/PERMA CATHETER INSERTION;  Surgeon: Jama Cordella MATSU, MD;  Location: [facility] INVASIVE CV LAB;  Service: Cardiovascular;  Laterality: N/A;   ENTEROSCOPY N/A 07/24/2023    Procedure: ENTEROSCOPY;  Surgeon: Unk Corinn Skiff, MD;  Location: [facility] ENDOSCOPY;  Service: Gastroenterology;  Laterality: N/A;   ESOPHAGOGASTRODUODENOSCOPY N/A 05/28/2023   Procedure: EGD (ESOPHAGOGASTRODUODENOSCOPY);  Surgeon: Jinny Carmine, MD;  Location: [facility] ENDOSCOPY;  Service: Endoscopy;  Laterality: N/A;   EYE SURGERY Bilateral    cataract   HOT HEMOSTASIS  05/28/2023   Procedure: EGD, WITH ARGON PLASMA COAGULATION;  Surgeon: Jinny Carmine, MD;  Location: [facility] ENDOSCOPY;  Service: Endoscopy;;   HOT HEMOSTASIS  07/24/2023   Procedure: EGD, WITH ARGON PLASMA COAGULATION;  Surgeon: Unk Corinn Skiff, MD;  Location: [facility] ENDOSCOPY;  Service: Gastroenterology;;   LEFT HEART CATH AND CORONARY ANGIOGRAPHY Left 05/06/2018   Procedure: LEFT HEART CATH AND CORONARY ANGIOGRAPHY;  Surgeon: Hester Wolm PARAS, MD;  Location: [facility] INVASIVE CV [facility];  Service: Cardiovascular;  Laterality: Left;   LOWER EXTREMITY ANGIOGRAPHY Right 01/22/2018   Procedure: LOWER EXTREMITY ANGIOGRAPHY;  Surgeon: Jama Cordella MATSU, MD;  Location: [facility] INVAS[facility] CV [facility];  Service: Cardiovascular;  Laterality: Right;   RENAL ANGIOGRAPHY Left 02/26/2018   Procedure: RENAL ANGIOGRAPHY;  Surgeon: Jama Cordella MATSU, MD;  Location: [facility] INV[facility] CV LAB;  Service: Cardiovascular;  Laterality: Left;   STENT PLACEMENT VASCULAR (  [facility] HX) Left    stent placement on LLE   SUBMUCOSAL INJECTION  07/24/2023   Procedure: INJECTION, SUBMUCOSAL;  Surgeon: Unk Corinn Skiff, MD;  Location: [facility] ENDOSCOPY;  Service: Gastroenterology;;   TUBAL LIGATION     FAMILY HISTORY: Family History  Problem Relation Age of Onset   Heart disease Mother    Diabetes Sister    Leukemia Daughter    Lung cancer Brother    Diabetes Other    Hypertension Other    Bladder Cancer Neg Hx    Kidney disease Neg Hx    Prostate cancer Neg Hx    Breast cancer Neg Hx    HEALTH MAINTENANCE: Social History   Tobacco Use   Smoking status: Former    Current  packs/day: 0.00    Types: Cigarettes    Quit date: 1987    Years since quitting: 38.6   Smokeless tobacco: Never   Tobacco comments:    quit 31 years  Vaping Use   Vaping status: Never Used  Substance Use Topics   Alcohol use: No   Drug use: No   Allergies  Allergen Reactions   Benazepril Nausea Only   Statins Other (See Comments)    Myalgia  swelling    Outside Source Comment: swelling   Current Outpatient Medications  Medication Sig Dispense Refill   ACCU-CHEK GUIDE test strip TEST BLOOD SUGAR THREE TIMES DAILY  AS  DIRECTED 300 strip 1   Accu-Chek Softclix Lancets lancets USE AS INSTRUCTED 3 TIMES A DAY 300 each 3   acetaminophen  (TYLENOL ) 500 MG tablet Take 1 tablet (500 mg total) by mouth every 6 (six) hours as needed for moderate pain or headache. 30 tablet 0   albuterol  (VENTOLIN  HFA) 108 (90 Base) MCG/ACT inhaler Inhale 2 puffs into the lungs every 6 (six) hours as needed for wheezing or shortness of breath. 8 g 2   Alcohol Swabs (B-D SINGLE USE SWABS REGULAR) PADS Use as directed for 3 times daily DX E11.65 100 each 1   amLODipine  (NORVASC ) 10 MG tablet TAKE 1 TABLET EVERY DAY AT BREAKFAST 90 tablet 1   anastrozole  (ARIMIDEX ) 1 MG tablet Take 1 mg by mouth daily.     aspirin  81 MG chewable tablet Chew 81 mg by mouth daily.     atorvastatin  (LIPITOR) 40 MG tablet Take 40 mg by mouth daily.     Blood Glucose Monitoring Suppl (ACCU-CHEK GUIDE ME) w/Device KIT Use as instructed. DX e11.65 1 kit 0   Budeson-Glycopyrrol-Formoterol  (BREZTRI  AEROSPHERE) 160-9-4.8 MCG/ACT AERO Inhale 2 puffs into the lungs 2 (two) times daily. 10.7 g 11   clopidogrel  (PLAVIX ) 75 MG tablet TAKE 1 TABLET DAILY AT DINNER 90 tablet 1   docusate sodium  (COLACE) 100 MG capsule Take 200 mg by mouth at bedtime as needed for mild constipation.     donepezil  (ARICEPT ) 10 MG tablet TAKE 1 TABLET AT BEDTIME FOR MEMORY (NEED MD APPOINTMENT) 90 tablet 3   ferrous sulfate  325 (65 FE) MG tablet Take 1 tablet  (325 mg total) by mouth daily with breakfast. 30 tablet 0   hydrALAZINE  (APRESOLINE ) 50 MG tablet Take 100 mg by mouth in the morning and at bedtime. Take one tablet in AM and two tablets in PM     isosorbide  mononitrate (IMDUR ) 60 MG 24 hr tablet Take 60 mg by mouth daily.     pantoprazole  (PROTONIX ) 40 MG tablet Take 1 tablet (40 mg total) by mouth daily. 30 tablet 0  No current facility-administered medications for this visit.   OBJECTIVE: Vitals:   08/16/23 1442  BP: 107/88  Pulse: 67  Resp: 16  Temp: (!) 97.5 F (36.4 C)  SpO2: 100%     Body mass index is 26.16 kg/m.      General: Well-developed, well-nourished, no acute distress. Eyes: Pink conjunctiva, anicteric sclera. HEENT: Normocephalic, moist mucous membranes, clear oropharnyx. Lungs: Clear to auscultation bilaterally. Heart: Regular rate and rhythm. No rubs, murmurs, or gallops. Abdomen: Soft, nontender, nondistended. No organomegaly noted, normoactive bowel sounds. Musculoskeletal: No edema, cyanosis, or clubbing. Neuro: Alert. Dementia at baseline.  Skin: No rashes or petechiae noted. Psych: Normal affect. Lymphatics: No cervical, calvicular, axillary or inguinal LAD.   LAB RESULTS: Lab Results  Component Value Date   NA 139 07/19/2023   K 4.0 07/19/2023   CL 105 07/19/2023   CO2 21 (L) 07/19/2023   GLUCOSE 90 07/19/2023   BUN 39 (H) 07/19/2023   CREATININE 5.16 (H) 07/19/2023   CALCIUM  9.3 07/19/2023   PROT 7.7 05/25/2023   ALBUMIN 3.7 05/25/2023   AST 36 05/25/2023   ALT 37 05/25/2023   ALKPHOS 75 05/25/2023   BILITOT 0.9 05/25/2023   GFRNONAA 8 (L) 07/19/2023   GFRAA 31 (L) 02/13/2019   Lab Results  Component Value Date   WBC 7.0 07/19/2023   NEUTROABS 4.6 07/19/2023   HGB 8.1 (L) 08/16/2023   HCT 26.6 (L) 08/16/2023   MCV 89.4 07/19/2023   PLT 253 07/19/2023   Lab Results  Component Value Date   TIBC 344 07/19/2023   TIBC 385 05/25/2023   TIBC 257 06/18/2022   FERRITIN 136  07/19/2023   FERRITIN 130 05/25/2023   FERRITIN 273 (H) 06/18/2022   IRONPCTSAT 10 (L) 07/19/2023   IRONPCTSAT 10 (L) 05/25/2023   IRONPCTSAT 12 (L) 06/18/2022   STUDIES: No results found.  ASSESSMENT AND PLAN:   HILARIE SINHA is a 89 y.o. female with pmh of hypertension, hyperlipidemia, IDA, COPD, diabetes, left breast cancer, OSA not on CPAP, stroke, ESRD on HD was referred to hematology for normocytic anemia.  # Iron  deficiency anemia- chronic anemia of CKD, stage V, on hemodialysis and iron  deficiency anemia due to chronic blood loss in setting of gastric and small bowel AVMs.  In May 2025 she presented to [facility] ER with melena and found to have a hemoglobin of 6.7.  She underwent EGD which found multiple AVMs in the duodenum, treated with APC.  Responded to transfusion and hemoglobin was 9.4 at time of discharge.  Hemoglobin dropped to 7.6 with dialysis and she was referred back to GI for further evaluation.  She is unsure if she has been receiving erythropoietin  during dialysis. - Goal ferritin around 500 in the setting of ESRD per Dr Clista - may consider long term octreotide to reduce risk of bleeding from small bowel AVMs.  - s/p enteroscopy with Dr. Unk- 1 single bleeding angioectasia treated with APC. Otherwise normal.   # Symptomatic anemia- s/p transfusion on 08/12/23 for hmg 6.5.  - Symptomatically improved. Hmg 8.1 today.  - Hold transfusion. Plan to check counts weekly. Transfuse for hmg < 7.   # ESRD on HD  - Patient follows with Dr. Dennise nephrology as outpatient.  She is on dialysis Tuesday Thursday and Saturday. Patient has been getting EPO injections and IV iron  infusions per nephrology.  We had previously deferred her hematology management to nephrology since she was on HD however, given drops in her counts due  to AVMs/blood loss, she was re-referred.  - Discussed the need to optimize iron  stores in the setting of end-stage renal disease.  -Proceed with venofer  today.  Plan to check her counts weekly for possible transfusion and will give venofer  weekly.  # History of left breast cancer, stage Ia, ER 91 to 100% positive, 1 to 10% positive and HER2 negative. - Diagnosed in 2017.  Status post left lumpectomy at [facility].  Pathology showing IDC, 14 mm, margins negative for carcinoma, DCIS focally present at the posterior black inked margin.  ER 91 to 100% positive, PR 1 to 10% and HER2 negative by FISH.  She was treated by Dr. Evalene Charter heme[facility]onc at [facility].  Last followed up in March 2021.  She is on anastrozole  1 mg daily since beginning of 2018.  Discussed with the patient that she had early stage hormone positive breast cancer with negative margins for carcinoma.  5 years of anastrozole  should be sufficient.  She has compelted 7 years of anastrozole . D/c d/t bone effects. - Last mammogram was in September 2024 was negative. Plan to repeat September 2025.   Disposition:  1 week- lab (H&H, hold tube), venofer  D2- possible transfusion 2 weeks-  lab (H&H, hold tube), venofer  D2- possible transfusion 3 weeks-  lab (H&H, hold tube), venofer  D2- possible transfusion 4 weeks-  lab (H&H, hold tube), venofer  D2- possible transfusion 5 weeks-  lab (H&H, hold tube), venofer  D2- possible transfusion 6 weeks - lab (cbc, cmp, ferritin, iron  studies, hold tube), Dr Rennie, +/- venofer  D2 - possible transfusion- la  Patient expressed understanding and was in agreement with this plan. She also understands that She can call clinic at any time with any questions, concerns, or complaints.   Tinnie KANDICE Dawn, NP   08/16/2023   CC: Dr Unk

## 2023-08-17 DIAGNOSIS — N186 End stage renal disease: Secondary | ICD-10-CM | POA: Diagnosis not present

## 2023-08-17 DIAGNOSIS — Z992 Dependence on renal dialysis: Secondary | ICD-10-CM | POA: Diagnosis not present

## 2023-08-17 DIAGNOSIS — Z23 Encounter for immunization: Secondary | ICD-10-CM | POA: Diagnosis not present

## 2023-08-19 DIAGNOSIS — I739 Peripheral vascular disease, unspecified: Secondary | ICD-10-CM | POA: Diagnosis not present

## 2023-08-19 DIAGNOSIS — I10 Essential (primary) hypertension: Secondary | ICD-10-CM | POA: Diagnosis not present

## 2023-08-19 DIAGNOSIS — I2510 Atherosclerotic heart disease of native coronary artery without angina pectoris: Secondary | ICD-10-CM | POA: Diagnosis not present

## 2023-08-19 DIAGNOSIS — I700 Atherosclerosis of aorta: Secondary | ICD-10-CM | POA: Diagnosis not present

## 2023-08-19 NOTE — Progress Notes (Signed)
 Established Patient Visit   Chief Complaint: Follow-up coronary artery disease, heart failure Chief Complaint  Patient presents with  . Follow-up    Follow up    Date of Service: 08/19/2023 Date of Birth: July 30, 1933 PCP: Jennifer Sigrid HERO, MD  History of Present Illness: Jennifer Carrillo is a 89 y.o.female patient who presented for follow-up of coronary artery disease, heart failure  Established with me 08/2023.  Past medical history significant for coronary artery disease, left heart cath 2020 with occluded RCA, moderate left main/OM1 disease and mild diffuse LAD disease-currently managed, heart failure with reduced EF, peripheral vascular disease status post carotid endarterectomy/stenting, status post renal artery stent/femoral artery stent, ESRD on hemodialysis.  Echocardiogram 12/2022 with with reduced LVEF of 35 to 40% with global hypokinesis, moderate to severe MR/severe TR. EGD 07/2023 with severe anemia showing angiectasia treated with argon plasma coagulation.  Accompanied by his son to visit, came in wheelchair.  Limited baseline functional status, walks with walker for short distance at home.  Has shortness of breath with household activities.  No complaint of chest pain/pressure.  No palpitation, dizziness.  EKG today showed sinus rhythm with right bundle branch block.  Past Medical and Surgical History  Past Medical History Past Medical History:  Diagnosis Date  . Anemia   . Aortic atherosclerosis ()   . Arthritis   . Atrial fibrillation (CMS/HHS-HCC)   . Breast cancer (CMS/HHS-HCC) 2017   Left breast ER+,PR+ s/p lumpectomy [facility])  . Carotid artery disease ()    s/p rt CEA, s/p lt carotid stent  . CKD (chronic kidney disease) stage 3 - 4   . Connective tissue disease (CMS/HHS-HCC)   . Coronary artery disease   . CVA (cerebral vascular accident) (CMS/HHS-HCC) 1987  . Encounter for blood transfusion 11/2018  . Glaucoma   . Hyperlipidemia   . Hypertension   . OSA (obstructive  sleep apnea)   . Osteoarthritis   . Peripheral vascular disease ()    s/p femoral artery stent  . SVT (supraventricular tachycardia) (CMS/HHS-HCC)   . Type 2 diabetes mellitus (CMS/HHS-HCC)   . Vitamin D  deficiency     Past Surgical History She has a past surgical history that includes Tubal ligation; right carotid endarterectomy; Lower extremitiy stents; and Tubal pregnancy.   Medications and Allergies  Current Medications  Current Outpatient Medications  Medication Sig Dispense Refill  . albuterol  MDI, PROVENTIL , VENTOLIN , PROAIR , HFA 90 mcg/actuation inhaler Inhale 2 Inhalations into the lungs every 6 (six) hours as needed    . amLODIPine  (NORVASC ) 10 MG tablet Take 1 tablet (10 mg total) by mouth once daily 90 tablet 3  . anastrozole  (ARIMIDEX ) 1 mg tablet Take 1 tablet by mouth once daily    . atorvastatin  (LIPITOR) 40 MG tablet Take 1 tablet (40 mg total) by mouth once daily 90 tablet 3  . budesonide -glycopyrrolate -formoterol  (BREZTRI  AEROSPHERE) 160-9-4.8 mcg/actuation inhaler Inhale 2 inhalations into the lungs 2 (two) times daily    . clopidogreL  (PLAVIX ) 75 mg tablet Take 1 tablet (75 mg total) by mouth once daily 90 tablet 3  . ferrous sulfate  325 (65 FE) MG tablet Take 325 mg by mouth daily with breakfast    . fluticasone  propion-salmeteroL (ADVAIR DISKUS) 100-50 mcg/dose diskus inhaler Inhale into the lungs    . hydrALAZINE  (APRESOLINE ) 100 MG tablet     . isosorbide  mononitrate (IMDUR ) 60 MG ER tablet Take 1 tablet (60 mg total) by mouth once daily 90 tablet 3  . donepeziL  (ARICEPT ) 10 MG  tablet Take 1 tablet by mouth once daily (Patient not taking: Reported on 08/19/2023)    . hydrALAZINE  (APRESOLINE ) 50 MG tablet Take 1 tablet (50 mg total) by mouth 3 (three) times daily (Patient not taking: Reported on 08/19/2023) 270 tablet 3  . metoprolol  SUCCinate (TOPROL -XL) 25 MG XL tablet Take 1 tablet (25 mg total) by mouth once daily 30 tablet 11   No current  facility-administered medications for this visit.    Allergies: Benazepril, Other, and Statins-hmg-coa reductase inhibitors  Social and Family History  Social History  reports that she has never smoked. She has never used smokeless tobacco. She reports that she does not drink alcohol and does not use drugs.  Family History Family History  Problem Relation Name Age of Onset  . Ulcers Brother    . Colon cancer Neg Hx    . Colon polyps Neg Hx      Review of Systems   Review of Systems:  Exertional dyspnea  Physical Examination   Vitals:BP 124/76   Pulse 65   Ht 170.2 cm (5' 7)   Wt 76.2 kg (168 lb)   SpO2 95%   BMI 26.31 kg/m  Ht:170.2 cm (5' 7) Wt:76.2 kg (168 lb) ADJ:Anib surface area is 1.9 meters squared. Body mass index is 26.31 kg/m.  HEENT: Pupils equally reactive to light and accomodation  Neck: Supple, no significant JVD Lungs: clear to auscultation bilaterally; no wheezes, rales, rhonchi Heart: Regular rate and rhythm. No murmur Extremities: no pedal edema  Assessment and Plan   89 y.o. female with  Coronary artery disease Heart failure with reduced EF Moderate to severe mitral regurgitation Severe tricuspid regurgitation ESRD on hemodialysis Hypertension Hyperlipidemia Peripheral arterial disease with multiple stents Anemia Limited baseline functional status/deconditioning  Patient with stable exertional dyspnea which is multifactorial including CAD/valvular issues, ESRD on dialysis, anemia, deconditioning issues. Euvolemic on exam, volume status managed by dialysis. We discussed in great detail about cardiac evaluation considering reduced LVEF.  Will continue medical management, avoid invasive procedures considering age/multiple comorbidities which is what patient/family prefer send I agree with. Blood pressure well-controlled. Continue Imdur  and current antihypertensives. Will add Toprol -XL 25 mg daily as part of GDMT.  Continue hydralazine .   Cannot add other GDMT with her renal dysfunction. She is on Plavix  with history of peripheral vascular disease with stent Overall poor long-term prognosis, recommended to establish goals of care with PCP  Orders Placed This Encounter  Procedures  . ECG 12-lead    Return in about 6 months (around 02/19/2024).  KRISHNA CHAITANYA ALLURI, MD  This dictation was prepared with dragon dictation. Any transcription errors that result from this process are unintentional.

## 2023-08-20 DIAGNOSIS — N250 Renal osteodystrophy: Secondary | ICD-10-CM | POA: Diagnosis not present

## 2023-08-20 DIAGNOSIS — Z23 Encounter for immunization: Secondary | ICD-10-CM | POA: Diagnosis not present

## 2023-08-20 DIAGNOSIS — Z992 Dependence on renal dialysis: Secondary | ICD-10-CM | POA: Diagnosis not present

## 2023-08-20 DIAGNOSIS — N186 End stage renal disease: Secondary | ICD-10-CM | POA: Diagnosis not present

## 2023-08-21 ENCOUNTER — Emergency Department

## 2023-08-21 ENCOUNTER — Other Ambulatory Visit: Payer: Self-pay

## 2023-08-21 ENCOUNTER — Inpatient Hospital Stay
Admission: EM | Admit: 2023-08-21 | Discharge: 2023-08-27 | DRG: 871 | Disposition: A | Discharge status: Home Health | Attending: Student | Admitting: Student

## 2023-08-21 DIAGNOSIS — A419 Sepsis, unspecified organism: Principal | ICD-10-CM | POA: Diagnosis present

## 2023-08-21 DIAGNOSIS — Z8249 Family history of ischemic heart disease and other diseases of the circulatory system: Secondary | ICD-10-CM

## 2023-08-21 DIAGNOSIS — Z992 Dependence on renal dialysis: Secondary | ICD-10-CM

## 2023-08-21 DIAGNOSIS — G928 Other toxic encephalopathy: Secondary | ICD-10-CM | POA: Diagnosis present

## 2023-08-21 DIAGNOSIS — I739 Peripheral vascular disease, unspecified: Secondary | ICD-10-CM | POA: Diagnosis present

## 2023-08-21 DIAGNOSIS — F0390 Unspecified dementia without behavioral disturbance: Secondary | ICD-10-CM | POA: Diagnosis present

## 2023-08-21 DIAGNOSIS — Z833 Family history of diabetes mellitus: Secondary | ICD-10-CM

## 2023-08-21 DIAGNOSIS — E1151 Type 2 diabetes mellitus with diabetic peripheral angiopathy without gangrene: Secondary | ICD-10-CM | POA: Diagnosis present

## 2023-08-21 DIAGNOSIS — E1122 Type 2 diabetes mellitus with diabetic chronic kidney disease: Secondary | ICD-10-CM | POA: Diagnosis not present

## 2023-08-21 DIAGNOSIS — Z79811 Long term (current) use of aromatase inhibitors: Secondary | ICD-10-CM

## 2023-08-21 DIAGNOSIS — R918 Other nonspecific abnormal finding of lung field: Secondary | ICD-10-CM | POA: Diagnosis not present

## 2023-08-21 DIAGNOSIS — I2489 Other forms of acute ischemic heart disease: Secondary | ICD-10-CM | POA: Diagnosis present

## 2023-08-21 DIAGNOSIS — Z1152 Encounter for screening for COVID-19: Secondary | ICD-10-CM | POA: Diagnosis not present

## 2023-08-21 DIAGNOSIS — J9601 Acute respiratory failure with hypoxia: Secondary | ICD-10-CM

## 2023-08-21 DIAGNOSIS — Z794 Long term (current) use of insulin: Secondary | ICD-10-CM

## 2023-08-21 DIAGNOSIS — Z8673 Personal history of transient ischemic attack (TIA), and cerebral infarction without residual deficits: Secondary | ICD-10-CM

## 2023-08-21 DIAGNOSIS — J441 Chronic obstructive pulmonary disease with (acute) exacerbation: Secondary | ICD-10-CM | POA: Diagnosis not present

## 2023-08-21 DIAGNOSIS — R059 Cough, unspecified: Secondary | ICD-10-CM | POA: Diagnosis not present

## 2023-08-21 DIAGNOSIS — I132 Hypertensive heart and chronic kidney disease with heart failure and with stage 5 chronic kidney disease, or end stage renal disease: Secondary | ICD-10-CM | POA: Diagnosis present

## 2023-08-21 DIAGNOSIS — L930 Discoid lupus erythematosus: Secondary | ICD-10-CM | POA: Diagnosis present

## 2023-08-21 DIAGNOSIS — J189 Pneumonia, unspecified organism: Secondary | ICD-10-CM | POA: Diagnosis present

## 2023-08-21 DIAGNOSIS — I6523 Occlusion and stenosis of bilateral carotid arteries: Secondary | ICD-10-CM | POA: Diagnosis present

## 2023-08-21 DIAGNOSIS — N186 End stage renal disease: Secondary | ICD-10-CM | POA: Diagnosis present

## 2023-08-21 DIAGNOSIS — E785 Hyperlipidemia, unspecified: Secondary | ICD-10-CM | POA: Diagnosis not present

## 2023-08-21 DIAGNOSIS — Z7982 Long term (current) use of aspirin: Secondary | ICD-10-CM

## 2023-08-21 DIAGNOSIS — F05 Delirium due to known physiological condition: Secondary | ICD-10-CM | POA: Diagnosis not present

## 2023-08-21 DIAGNOSIS — Z7902 Long term (current) use of antithrombotics/antiplatelets: Secondary | ICD-10-CM

## 2023-08-21 DIAGNOSIS — Z9981 Dependence on supplemental oxygen: Secondary | ICD-10-CM

## 2023-08-21 DIAGNOSIS — N2581 Secondary hyperparathyroidism of renal origin: Secondary | ICD-10-CM | POA: Diagnosis present

## 2023-08-21 DIAGNOSIS — J9621 Acute and chronic respiratory failure with hypoxia: Secondary | ICD-10-CM | POA: Diagnosis not present

## 2023-08-21 DIAGNOSIS — R4182 Altered mental status, unspecified: Secondary | ICD-10-CM | POA: Diagnosis present

## 2023-08-21 DIAGNOSIS — J440 Chronic obstructive pulmonary disease with acute lower respiratory infection: Secondary | ICD-10-CM | POA: Diagnosis not present

## 2023-08-21 DIAGNOSIS — E559 Vitamin D deficiency, unspecified: Secondary | ICD-10-CM | POA: Diagnosis present

## 2023-08-21 DIAGNOSIS — Z8719 Personal history of other diseases of the digestive system: Secondary | ICD-10-CM

## 2023-08-21 DIAGNOSIS — D631 Anemia in chronic kidney disease: Secondary | ICD-10-CM | POA: Diagnosis present

## 2023-08-21 DIAGNOSIS — I2510 Atherosclerotic heart disease of native coronary artery without angina pectoris: Secondary | ICD-10-CM | POA: Diagnosis not present

## 2023-08-21 DIAGNOSIS — J449 Chronic obstructive pulmonary disease, unspecified: Secondary | ICD-10-CM

## 2023-08-21 DIAGNOSIS — K31819 Angiodysplasia of stomach and duodenum without bleeding: Secondary | ICD-10-CM | POA: Diagnosis present

## 2023-08-21 DIAGNOSIS — I4510 Unspecified right bundle-branch block: Secondary | ICD-10-CM | POA: Diagnosis present

## 2023-08-21 DIAGNOSIS — Z95828 Presence of other vascular implants and grafts: Secondary | ICD-10-CM | POA: Diagnosis not present

## 2023-08-21 DIAGNOSIS — I120 Hypertensive chronic kidney disease with stage 5 chronic kidney disease or end stage renal disease: Secondary | ICD-10-CM | POA: Diagnosis not present

## 2023-08-21 DIAGNOSIS — I517 Cardiomegaly: Secondary | ICD-10-CM | POA: Diagnosis not present

## 2023-08-21 DIAGNOSIS — J984 Other disorders of lung: Secondary | ICD-10-CM | POA: Diagnosis not present

## 2023-08-21 DIAGNOSIS — Z806 Family history of leukemia: Secondary | ICD-10-CM

## 2023-08-21 DIAGNOSIS — R0602 Shortness of breath: Secondary | ICD-10-CM

## 2023-08-21 DIAGNOSIS — Z87891 Personal history of nicotine dependence: Secondary | ICD-10-CM

## 2023-08-21 DIAGNOSIS — I10 Essential (primary) hypertension: Secondary | ICD-10-CM

## 2023-08-21 DIAGNOSIS — Z801 Family history of malignant neoplasm of trachea, bronchus and lung: Secondary | ICD-10-CM

## 2023-08-21 DIAGNOSIS — Z955 Presence of coronary angioplasty implant and graft: Secondary | ICD-10-CM

## 2023-08-21 LAB — CBC WITH DIFFERENTIAL/PLATELET
Abs Immature Granulocytes: 0.13 K/uL — ABNORMAL HIGH (ref 0.00–0.07)
Basophils Absolute: 0 K/uL (ref 0.0–0.1)
Basophils Relative: 0 %
Eosinophils Absolute: 0 K/uL (ref 0.0–0.5)
Eosinophils Relative: 0 %
HCT: 30.5 % — ABNORMAL LOW (ref 36.0–46.0)
Hemoglobin: 9.3 g/dL — ABNORMAL LOW (ref 12.0–15.0)
Immature Granulocytes: 1 %
Lymphocytes Relative: 18 %
Lymphs Abs: 2.4 K/uL (ref 0.7–4.0)
MCH: 27.6 pg (ref 26.0–34.0)
MCHC: 30.5 g/dL (ref 30.0–36.0)
MCV: 90.5 fL (ref 80.0–100.0)
Monocytes Absolute: 1 K/uL (ref 0.1–1.0)
Monocytes Relative: 8 %
Neutro Abs: 9.4 K/uL — ABNORMAL HIGH (ref 1.7–7.7)
Neutrophils Relative %: 73 %
Platelets: 226 K/uL (ref 150–400)
RBC: 3.37 MIL/uL — ABNORMAL LOW (ref 3.87–5.11)
RDW: 18.6 % — ABNORMAL HIGH (ref 11.5–15.5)
Smear Review: NORMAL
WBC: 13 K/uL — ABNORMAL HIGH (ref 4.0–10.5)
nRBC: 0 % (ref 0.0–0.2)

## 2023-08-21 LAB — RESP PANEL BY RT-PCR (RSV, FLU A&B, COVID)  RVPGX2
Influenza A by PCR: NEGATIVE
Influenza B by PCR: NEGATIVE
Resp Syncytial Virus by PCR: NEGATIVE
SARS Coronavirus 2 by RT PCR: NEGATIVE

## 2023-08-21 LAB — BASIC METABOLIC PANEL WITH GFR
Anion gap: 15 (ref 5–15)
BUN: 41 mg/dL — ABNORMAL HIGH (ref 8–23)
CO2: 23 mmol/L (ref 22–32)
Calcium: 8.5 mg/dL — ABNORMAL LOW (ref 8.9–10.3)
Chloride: 103 mmol/L (ref 98–111)
Creatinine, Ser: 6.12 mg/dL — ABNORMAL HIGH (ref 0.44–1.00)
GFR, Estimated: 6 mL/min — ABNORMAL LOW (ref >60)
Glucose, Bld: 118 mg/dL — ABNORMAL HIGH (ref 70–99)
Potassium: 3.3 mmol/L — ABNORMAL LOW (ref 3.5–5.1)
Sodium: 141 mmol/L (ref 135–145)

## 2023-08-21 LAB — TROPONIN I (HIGH SENSITIVITY)
Troponin I (High Sensitivity): 35 ng/L — ABNORMAL HIGH (ref <18)
Troponin I (High Sensitivity): 32 ng/L — ABNORMAL HIGH (ref <18)

## 2023-08-21 LAB — LACTIC ACID, PLASMA: Lactic Acid, Venous: 1.2 mmol/L (ref 0.5–1.9)

## 2023-08-21 LAB — BRAIN NATRIURETIC PEPTIDE: B Natriuretic Peptide: 2260.1 pg/mL — ABNORMAL HIGH (ref 0.0–100.0)

## 2023-08-21 MED ORDER — ACETAMINOPHEN 650 MG RE SUPP: 650.0000 mg | Freq: Four times a day (QID) | RECTAL | Status: DC | PRN

## 2023-08-21 MED ORDER — DONEPEZIL HCL 5 MG PO TABS
10.0000 mg | ORAL_TABLET | Freq: Every day | ORAL | Status: DC
  Administered 2023-08-22 – 2023-08-26 (×6): 10 mg via ORAL
  Filled 2023-08-21 (×6): qty 2

## 2023-08-21 MED ORDER — ACETAMINOPHEN 325 MG PO TABS
650.0000 mg | ORAL_TABLET | Freq: Four times a day (QID) | ORAL | Status: DC | PRN
  Filled 2023-08-21: qty 2

## 2023-08-21 MED ORDER — HYDRALAZINE HCL 50 MG PO TABS
100.0000 mg | ORAL_TABLET | Freq: Two times a day (BID) | ORAL | Status: DC
  Administered 2023-08-22 – 2023-08-27 (×11): 100 mg via ORAL
  Filled 2023-08-21 (×11): qty 2

## 2023-08-21 MED ORDER — HEPARIN SODIUM (PORCINE) 5000 UNIT/ML IJ SOLN
5000.0000 [IU] | Freq: Three times a day (TID) | INTRAMUSCULAR | Status: DC
  Administered 2023-08-22 – 2023-08-27 (×16): 5000 [IU] via SUBCUTANEOUS
  Filled 2023-08-21 (×16): qty 1

## 2023-08-21 MED ORDER — FERROUS SULFATE 325 (65 FE) MG PO TABS
325.0000 mg | ORAL_TABLET | Freq: Every day | ORAL | Status: DC
  Administered 2023-08-22 – 2023-08-27 (×6): 325 mg via ORAL
  Filled 2023-08-21 (×5): qty 1

## 2023-08-21 MED ORDER — SODIUM CHLORIDE 0.9 % IV SOLN
2.0000 g | INTRAVENOUS | Status: AC
  Administered 2023-08-23 – 2023-08-25 (×4): 2 g via INTRAVENOUS
  Filled 2023-08-21 (×5): qty 20

## 2023-08-21 MED ORDER — ONDANSETRON HCL 4 MG/2ML IJ SOLN: 4.0000 mg | Freq: Four times a day (QID) | INTRAMUSCULAR | Status: DC | PRN

## 2023-08-21 MED ORDER — SODIUM CHLORIDE 0.9 % IV SOLN
1.0000 g | Freq: Once | INTRAVENOUS | Status: AC
  Administered 2023-08-21 (×2): 1 g via INTRAVENOUS
  Filled 2023-08-21: qty 10

## 2023-08-21 MED ORDER — LACTATED RINGERS IV SOLN: 150.0000 mL/h | INTRAVENOUS | Status: DC

## 2023-08-21 MED ORDER — LACTATED RINGERS IV BOLUS
2000.0000 mL | Freq: Once | INTRAVENOUS | Status: AC
  Administered 2023-08-21 (×2): 2000 mL via INTRAVENOUS

## 2023-08-21 MED ORDER — SODIUM CHLORIDE 0.9 % IV SOLN
500.0000 mg | Freq: Once | INTRAVENOUS | Status: AC
  Administered 2023-08-21 (×2): 500 mg via INTRAVENOUS
  Filled 2023-08-21: qty 5

## 2023-08-21 MED ORDER — PANTOPRAZOLE SODIUM 40 MG PO TBEC
40.0000 mg | DELAYED_RELEASE_TABLET | Freq: Every day | ORAL | Status: DC
  Administered 2023-08-22 – 2023-08-27 (×6): 40 mg via ORAL
  Filled 2023-08-21 (×6): qty 1

## 2023-08-21 MED ORDER — ASPIRIN 81 MG PO CHEW
81.0000 mg | CHEWABLE_TABLET | Freq: Every day | ORAL | Status: DC
  Administered 2023-08-22 – 2023-08-24 (×3): 81 mg via ORAL
  Filled 2023-08-21 (×4): qty 1

## 2023-08-21 MED ORDER — SODIUM CHLORIDE 0.9 % IV SOLN
500.0000 mg | INTRAVENOUS | Status: DC
  Administered 2023-08-23: 500 mg via INTRAVENOUS
  Filled 2023-08-21: qty 5

## 2023-08-21 MED ORDER — BUDESON-GLYCOPYRROL-FORMOTEROL 160-9-4.8 MCG/ACT IN AERO: 2.0000 | INHALATION_SPRAY | Freq: Two times a day (BID) | RESPIRATORY_TRACT | Status: DC

## 2023-08-21 MED ORDER — ONDANSETRON HCL 4 MG PO TABS
4.0000 mg | ORAL_TABLET | Freq: Four times a day (QID) | ORAL | Status: DC | PRN
Start: 2023-08-21 — End: 2023-08-27
  Filled 2023-08-21: qty 1

## 2023-08-21 MED ORDER — LACTATED RINGERS IV BOLUS: 3000.0000 mL | Freq: Once | INTRAVENOUS | Status: DC

## 2023-08-21 MED ORDER — IOHEXOL 350 MG/ML SOLN
75.0000 mL | Freq: Once | INTRAVENOUS | Status: AC | PRN
  Administered 2023-08-21 (×2): 75 mL via INTRAVENOUS

## 2023-08-21 MED ORDER — ALBUTEROL SULFATE (2.5 MG/3ML) 0.083% IN NEBU: 2.5000 mg | INHALATION_SOLUTION | RESPIRATORY_TRACT | Status: DC | PRN

## 2023-08-21 MED ORDER — GUAIFENESIN ER 600 MG PO TB12
600.0000 mg | ORAL_TABLET | Freq: Two times a day (BID) | ORAL | Status: DC
  Administered 2023-08-22 – 2023-08-27 (×12): 600 mg via ORAL
  Filled 2023-08-21 (×12): qty 1

## 2023-08-21 NOTE — Assessment & Plan Note (Signed)
 At baseline

## 2023-08-21 NOTE — H&P (Addendum)
 History and Physical    Patient: Jennifer Carrillo FMW:[id number] DOB: July 05, 1933 DOA: 08/21/2023 DOS: the patient was seen and examined on 08/21/2023 PCP: Kristina Tinnie POUR, PA-C  Patient coming from: Home  Chief Complaint:  Chief Complaint  Patient presents with   Altered Mental Status    HPI: Jennifer Carrillo is a 89 y.o. female with medical history significant for HTN, ESRD on HD TTS, CAD, PVD/carotid artery disease s/p stenting, COPD on home O2 2 L, dementia, upper GI bleed from gastric AVM (05/2023), being admitted with sepsis secondary to multifocal pneumonia after presenting with 2-day history of cough and congestion and now weakness and confusion.  She has had no chest pain, fever or chills. In the ED, Tmax 99.1 and tachycardic to the 130s and 140s, tachypneic to 28, O2 sats near 100% on 2 L Labs notable for WBC 13,000 with lactic acid 1.2 and negative respiratory viral panel. Troponin 35 and BNP 2260 Hemoglobin at baseline at 9.3 Potassium 3.3.  Renal function reflecting dialysis status EKG showed sinus tachycardia with RBBB, T wave inversion in lateral leads. CTA chest negative for PE with findings concerning for multifocal patchy airspace disease worrisome for multifocal pneumonia. Patient started on Rocephin  and azithromycin .  Initiated on sepsis fluids Admission requested     Past Medical History:  Diagnosis Date   Arthritis    Asthma    Calf pain    Cancer (HCC) 2016   left breast   COPD (chronic obstructive pulmonary disease) (HCC)    Diabetes (HCC)    Discoid lupus    Gastro-esophageal reflux    Glaucoma    Hyperlipemia    Hypertension    Iron  deficiency anemia    Joint pain    Leg swelling    Osteoarthritis    PVD (peripheral vascular disease) (HCC)    Sinus problem    Sleep apnea    No CPAP/ Can't tolerate   Stroke (HCC) 1987   Past Surgical History:  Procedure Laterality Date   BREAST SURGERY Left    left lumpectomy   CAROTID ANGIOGRAPHY Left  06/25/2018   Procedure: CAROTID ANGIOGRAPHY, possible intervention;  Surgeon: Jama Cordella MATSU, MD;  Location: [facility];  Service: Cardiovascular;  Laterality: Left;   CAROTID ENDARTERECTOMY Right    CAROTID PTA/STENT INTERVENTION Left 06/25/2018   Procedure: CAROTID PTA/STENT INTERVENTION;  Surgeon: Jama Cordella MATSU, MD;  Location: [facility];  Service: Cardiovascular;  Laterality: Left;   CATARACT EXTRACTION Right 2013   CORONARY STENT PLACEMENT     L. L. E.   DIALYSIS/PERMA CATHETER INSERTION N/A 12/29/2020   Procedure: DIALYSIS/PERMA CATHETER INSERTION;  Surgeon: Marea Selinda RAMAN, MD;  Location: [facility];  Service: Cardiovascular;  Laterality: N/A;   DIALYSIS/PERMA CATHETER INSERTION N/A 12/14/2021   Procedure: DIALYSIS/PERMA CATHETER INSERTION;  Surgeon: Jama Cordella MATSU, MD;  Location: [facility];  Service: Cardiovascular;  Laterality: N/A;   ENTEROSCOPY N/A 07/24/2023   Procedure: ENTEROSCOPY;  Surgeon: Unk Corinn Skiff, MD;  Location: [facility]OSCOPY;  Service: Gastroenterology;  Laterality: N/A;   ESOPHAGOGASTRODUODENOSCOPY N/A 05/28/2023   Procedure: EGD (ESOPHAGOGASTRODUODENOSCOPY);  Surgeon: Jinny Carmine, MD;  Location: [facility] ENDOSCOPY;  Service: Endoscopy;  Laterality: N/A;   EYE SURGERY Bilateral    cataract   HOT HEMOSTASIS  05/28/2023   Procedure: EGD, WITH ARGON PLASMA COAGULATION;  Surgeon: Jinny Carmine, MD;  Location: [facility] ENDOSCOPY;  Service: Endoscopy;;   HOT HEMOSTASIS  07/24/2023   Procedure: EGD, WITH  ARGON PLASMA COAGULATION;  Surgeon: Unk Corinn Skiff, MD;  Location: [facility] ENDOSCOPY;  Service: Gastroenterology;;   LEFT HEART CATH AND CORONARY ANGIOGRAPHY Left 05/06/2018   Procedure: LEFT HEART CATH AND CORONARY ANGIOGRAPHY;  Surgeon: Hester Wolm PARAS, MD;  Location: [facility];  Service: Cardiovascular;  Laterality: Left;   LOWER EXTREMITY ANGIOGRAPHY Right 01/22/2018   Procedure: LOWER EXTREMITY ANGIOGRAPHY;   Surgeon: Jama Cordella MATSU, MD;  Location: [facility];  Service: Cardiovascular;  Laterality: Right;   RENAL ANGIOGRAPHY Left 02/26/2018   Procedure: RENAL ANGIOGRAPHY;  Surgeon: Jama Cordella MATSU, MD;  Location: [facility];  Service: Cardiovascular;  Laterality: Left;   STENT PLACEMENT VASCULAR ([facility] HX) Left    stent placement on LLE   SUBMUCOSAL INJECTION  07/24/2023   Procedure: INJECTION, SUBMUCOSAL;  Surgeon: Unk Corinn Skiff, MD;  Location: [facility] ENDOSCOPY;  Service: Gastroenterology;;   TUBAL LIGATION     Social History:  reports that she quit smoking about 38 years ago. Her smoking use included cigarettes. She has never used smokeless tobacco. She reports that she does not drink alcohol and does not use drugs.  Allergies  Allergen Reactions   Benazepril Nausea Only   Statins Other (See Comments)    Myalgia  swelling    Outside Source Comment: swelling    Family History  Problem Relation Age of Onset   Heart disease Mother    Diabetes Sister    Leukemia Daughter    Lung cancer Brother    Diabetes Other    Hypertension Other    Bladder Cancer Neg Hx    Kidney disease Neg Hx    Prostate cancer Neg Hx    Breast cancer Neg Hx     Prior to Admission medications   Medication Sig Start Date End Date Taking? Authorizing Provider  ACCU-CHEK GUIDE test strip TEST BLOOD SUGAR THREE TIMES DAILY  AS  DIRECTED 04/27/20   Khan, Fozia M, MD  Accu-Chek Softclix Lancets lancets USE AS INSTRUCTED 3 TIMES A DAY 09/22/19   Khan, Fozia M, MD  acetaminophen  (TYLENOL ) 500 MG tablet Take 1 tablet (500 mg total) by mouth every 6 (six) hours as needed for moderate pain or headache. 12/31/20   Patel, Sona, MD  albuterol  (VENTOLIN  HFA) 108 [phone] Base) MCG/ACT inhaler Inhale 2 puffs into the lungs every 6 (six) hours as needed for wheezing or shortness of breath. 02/01/23   McDonough, Tinnie POUR, PA-C  Alcohol Swabs (B-D SINGLE USE SWABS REGULAR) PADS Use as directed for 3 times  daily DX E11.65 06/22/19   Scarboro, Adam J, NP  amLODipine  (NORVASC ) 10 MG tablet TAKE 1 TABLET EVERY DAY AT [facility] 06/14/21   Khan, Fozia M, MD  anastrozole  (ARIMIDEX ) 1 MG tablet Take 1 mg by mouth daily. 04/27/21   [provider]  aspirin  81 MG chewable tablet Chew 81 mg by mouth daily.    [provider]  atorvastatin  (LIPITOR) 40 MG tablet Take 40 mg by mouth daily.    [provider]  Blood Glucose Monitoring Suppl (ACCU-CHEK GUIDE ME) w/Device KIT Use as instructed. DX e11.65 06/18/19   Leonce Juliene PARAS, NP  Budeson-Glycopyrrol-Formoterol  (BREZTRI  AEROSPHERE) 160-9-4.8 MCG/ACT AERO Inhale 2 puffs into the lungs 2 (two) times daily. 02/01/23   McDonough, Lauren K, PA-C  clopidogrel  (PLAVIX ) 75 MG tablet TAKE 1 TABLET DAILY AT [facility] 05/31/23   Abernathy, Alyssa, NP  docusate sodium  (COLACE) 100 MG capsule Take 200 mg by mouth at bedtime as  needed for mild constipation.    [provider]  donepezil  (ARICEPT ) 10 MG tablet TAKE 1 TABLET AT BEDTIME FOR MEMORY (NEED MD APPOINTMENT) 02/01/23   McDonough, Tinnie POUR, PA-C  ferrous sulfate  325 (65 FE) MG tablet Take 1 tablet (325 mg total) by mouth daily with breakfast. 05/29/23   Laurita Pillion, MD  hydrALAZINE  (APRESOLINE ) 50 MG tablet Take 100 mg by mouth in the morning and at bedtime. Take one tablet in AM and two tablets in PM 12/12/20 08/16/23  [provider]  isosorbide  mononitrate (IMDUR ) 60 MG 24 hr tablet Take 60 mg by mouth daily. 12/13/20   [provider]  pantoprazole  (PROTONIX ) 40 MG tablet Take 1 tablet (40 mg total) by mouth daily. 05/28/23 08/16/23  Laurita Pillion, MD    Physical Exam: Vitals:   08/21/23 1957 08/21/23 2030 08/21/23 2100 08/21/23 2238  BP: 119/60 122/73 121/76 120/69  Pulse: (!) 144 (!) 147 (!) 143 (!) 135  Resp: 18 (!) 28 (!) 28   Temp: 99.1 F (37.3 C)     TempSrc: Oral     SpO2: 98% 100% 100% 98%  Weight:      Height:       Physical Exam Vitals and nursing note  reviewed.  Constitutional:      General: She is sleeping. She is not in acute distress.    Comments: Patient appears dyspneic but not in distress  HENT:     Head: Normocephalic and atraumatic.  Cardiovascular:     Rate and Rhythm: Regular rhythm. Tachycardia present.     Heart sounds: Normal heart sounds.  Pulmonary:     Effort: Tachypnea present.     Breath sounds: Normal breath sounds.  Abdominal:     Palpations: Abdomen is soft.     Tenderness: There is no abdominal tenderness.  Musculoskeletal:     Right lower leg: No edema.     Left lower leg: No edema.  Neurological:     General: No focal deficit present.     Mental Status: She is easily aroused.     Labs on Admission: I have personally reviewed following labs and imaging studies  CBC: Recent Labs  Lab 08/16/23 1421 08/21/23 2009  WBC  --  13.0*  NEUTROABS  --  9.4*  HGB 8.1* 9.3*  HCT 26.6* 30.5*  MCV  --  90.5  PLT  --  226   Basic Metabolic Panel: Recent Labs  Lab 08/21/23 2009  NA 141  K 3.3*  CL 103  CO2 23  GLUCOSE 118*  BUN 41*  CREATININE 6.12*  CALCIUM  8.5*   GFR: Estimated Creatinine Clearance: 7 mL/min (A) (by C-G formula based on SCr of 6.12 mg/dL (H)). Liver Function Tests: No results for input(s): AST, ALT, ALKPHOS, BILITOT, PROT, ALBUMIN in the last 168 hours. No results for input(s): LIPASE, AMYLASE in the last 168 hours. No results for input(s): AMMONIA in the last 168 hours. Coagulation Profile: No results for input(s): INR, PROTIME in the last 168 hours. Cardiac Enzymes: No results for input(s): CKTOTAL, CKMB, CKMBINDEX, TROPONINI in the last 168 hours. BNP (last 3 results) No results for input(s): PROBNP in the last 8760 hours. HbA1C: No results for input(s): HGBA1C in the last 72 hours. CBG: No results for input(s): GLUCAP in the last 168 hours. Lipid Profile: No results for input(s): CHOL, HDL, LDLCALC, TRIG, CHOLHDL,  LDLDIRECT in the last 72 hours. Thyroid  Function Tests: No results for input(s): TSH, T4TOTAL, FREET4, T3FREE, THYROIDAB  in the last 72 hours. Anemia Panel: No results for input(s): VITAMINB12, FOLATE, FERRITIN, TIBC, IRON , RETICCTPCT in the last 72 hours. Urine analysis:    Component Value Date/Time   COLORURINE STRAW (A) 10/28/2021 1532   APPEARANCEUR CLEAR (A) 10/28/2021 1532   APPEARANCEUR Clear 03/24/2020 0954   LABSPEC 1.004 (L) 10/28/2021 1532   PHURINE 9.0 (H) 10/28/2021 1532   GLUCOSEU NEGATIVE 10/28/2021 1532   HGBUR NEGATIVE 10/28/2021 1532   BILIRUBINUR NEGATIVE 10/28/2021 1532   BILIRUBINUR Negative 03/24/2020 0954   KETONESUR NEGATIVE 10/28/2021 1532   PROTEINUR 100 (A) 10/28/2021 1532   UROBILINOGEN 0.2 12/17/2017 0931   NITRITE NEGATIVE 10/28/2021 1532   LEUKOCYTESUR NEGATIVE 10/28/2021 1532    Radiological Exams on Admission: CT Angio Chest PE W/Cm &/Or Wo Cm Result Date: 08/21/2023 CLINICAL DATA:  High probability for PE. Upper respiratory infection. EXAM: CT ANGIOGRAPHY CHEST WITH CONTRAST TECHNIQUE: Multidetector CT imaging of the chest was performed using the standard protocol during bolus administration of intravenous contrast. Multiplanar CT image reconstructions and MIPs were obtained to evaluate the vascular anatomy. RADIATION DOSE REDUCTION: This exam was performed according to the departmental dose-optimization program which includes automated exposure control, adjustment of the mA and/or kV according to patient size and/or use of iterative reconstruction technique. CONTRAST:  75mL OMNIPAQUE  IOHEXOL  350 MG/ML SOLN COMPARISON:  CT of the chest 10/28/2021. FINDINGS: Cardiovascular: Heart is enlarged. Aorta is normal in size. There is no pericardial effusion. There are atherosclerotic calcifications of the aorta and coronary arteries. There is adequate opacification of the pulmonary arteries to the segmental level. There is no pulmonary  embolism identified. Right-sided central venous catheter tip ends in the superior portion of the right atrium. Mediastinum/Nodes: There are prominent, but nonenlarged AP window lymph nodes, paratracheal lymph nodes and right hilar lymph nodes. Visualized esophagus and thyroid  gland are within normal limits. Lungs/Pleura: Multifocal patchy airspace, ground-glass and tree-in-bud opacities are seen throughout both lungs sparing the lung apices. There is no pleural effusion or pneumothorax. There is central peribronchial wall and bilateral lower lobe peribronchial wall thickening. Upper Abdomen: No acute abnormality. There is thickening of the bilateral adrenal glands nonspecific and unchanged. Musculoskeletal: No chest wall abnormality. No acute or significant osseous findings. Review of the MIP images confirms the above findings. IMPRESSION: 1. No evidence for pulmonary embolism. 2. Multifocal patchy airspace, ground-glass and tree-in-bud opacities throughout both lungs worrisome for multifocal pneumonia. 3. Central peribronchial wall thickening compatible with bronchitis. 4. Cardiomegaly. 5. Aortic atherosclerosis. Aortic Atherosclerosis (ICD10-I70.0). Electronically Signed   By: Greig Pique M.D.   On: 08/21/2023 22:32   DG Chest 1 View Result Date: 08/21/2023 CLINICAL DATA:  10031 Cough 10031 EXAM: CHEST  1 VIEW COMPARISON:  May 25, 2023 FINDINGS: Diffuse bilateral perihilar interstitial opacities. Patchy alveolar opacities in the right mid and lower lung zones. No pleural effusion or pneumothorax. Right hemodialysis catheter terminates in the high right atrium. Mild cardiomegaly.Choose 3no acute fracture or destructive lesion. IMPRESSION: Bilateral perihilar interstitial opacities, which may represent atypical/viral infection or interstitial edema. Patchy alveolar opacities in the right mid and lower lung zones, worrisome for a superimposed bronchopneumonia. Electronically Signed   By: Rogelia Myers M.D.    On: 08/21/2023 20:30   Data Reviewed for HPI: Relevant notes from primary care and specialist visits, past discharge summaries as available in EHR, including Care Everywhere. Prior diagnostic testing as pertinent to current admission diagnoses Updated medications and problem lists for reconciliation ED course, including vitals, labs, imaging, treatment and response  to treatment Triage notes, nursing and pharmacy notes and ED provider's notes Notable results as noted above in HPI      Assessment and Plan: * Sepsis due to pneumonia (HCC) Sepsis criteria include fever, tachycardia and tachypnea with leukocytosis, altered mental status  History of affected contacts per respiratory viral panel negative Rocephin  and azithromycin  Received an initial fluid bolus, will rebolus as needed in view of dialysis status Will get a procalcitonin Antitussives, DuoNebs as needed SLP consult and aspiration precaution   Dementia (HCC) Mild delirium/acute toxic metabolic encephalopathy Patient presented with confusion in the setting of acute infection Neurologic checks, fall and aspiration precautions Continue Aricept   COPD (chronic obstructive pulmonary disease) (HCC) Chronic respiratory failure with hypoxia Acute exacerbation not suspected As needed DuoNebs Supplemental oxygen  ESRD on hemodialysis [facility]) Nephrology consult for continuation of dialysis No missed or incomplete dialysis sessions  Coronary artery disease involving native coronary artery of native heart Elevated troponin of 35, suspect demand No complaints of chest pain, EKG nonacute Continue aspirin , atorvastatin  and Imdur   PAD (HCC) History of stenting of carotid artery, femoral artery and renal artery No acute issues Continue aspirin  and statin  AVM (arteriovenous malformation) of duodenum, acquired History of upper GI bleed 05/2023 (AVM) No acute issues suspected Continue Protonix , ferrous sulfate   Anemia of chronic  kidney failure, stage 5 (HCC) At baseline     DVT prophylaxis: Heparin   Consults: nephrology  Advance Care Planning:   Code Status: Prior   Family Communication: none  Disposition Plan: Back to previous home environment  Severity of Illness: The appropriate patient status for this patient is OBSERVATION. Observation status is judged to be reasonable and necessary in order to provide the required intensity of service to ensure the patient's safety. The patient's presenting symptoms, physical exam findings, and initial radiographic and laboratory data in the context of their medical condition is felt to place them at decreased risk for further clinical deterioration. Furthermore, it is anticipated that the patient will be medically stable for discharge from the hospital within 2 midnights of admission.   Author: Delayne LULLA Solian, MD 08/21/2023 11:32 PM  For on call review [email or web address].

## 2023-08-21 NOTE — Assessment & Plan Note (Addendum)
 History of stenting of carotid artery, femoral artery and renal artery No acute issues Continue aspirin  and statin

## 2023-08-21 NOTE — Assessment & Plan Note (Signed)
 Elevated troponin of 35, suspect demand No complaints of chest pain, EKG nonacute Continue aspirin , atorvastatin  and Imdur 

## 2023-08-21 NOTE — ED Notes (Addendum)
 Fluids stopped at this time after this RN noticed the patients BNP was elevated. Bradler MD made aware and is okay with that at this time. Patient got a total of approximately 625mL's of LR. Jossie, MD made aware.

## 2023-08-21 NOTE — Assessment & Plan Note (Signed)
 Nephrology consult for continuation of dialysis No missed or incomplete dialysis sessions

## 2023-08-21 NOTE — Assessment & Plan Note (Signed)
 Mild delirium/acute toxic metabolic encephalopathy Patient presented with confusion in the setting of acute infection Neurologic checks, fall and aspiration precautions Continue Aricept 

## 2023-08-21 NOTE — Assessment & Plan Note (Signed)
 Sepsis criteria include fever, tachycardia and tachypnea with leukocytosis, altered mental status  History of affected contacts per respiratory viral panel negative Rocephin  and azithromycin  Received an initial fluid bolus, will rebolus as needed in view of dialysis status Will get a procalcitonin Antitussives, DuoNebs as needed SLP consult and aspiration precaution

## 2023-08-21 NOTE — Hospital Course (Signed)
 SABRA

## 2023-08-21 NOTE — Assessment & Plan Note (Addendum)
 Chronic respiratory failure with hypoxia Acute exacerbation not suspected As needed DuoNebs Supplemental oxygen

## 2023-08-21 NOTE — ED Triage Notes (Signed)
 Per daughter pt had URI over the weekend, aprox 2 days ago pt had inc weakness and confusion. Pt has cough congestion, denies pain. Pt alert and oriented x4

## 2023-08-21 NOTE — ED Provider Notes (Signed)
 [facility] Provider Note   Event Date/Time   First MD Initiated Contact with Patient 08/21/23 2001     (approximate) History  Altered Mental Status  HPI Jennifer Carrillo is a 89 y.o. female with a past medical history of COPD, diabetes, hyperlipidemia, hypertension, and end-stage renal disease on dialysis Tuesday/Thursday/Saturday who presents complaining of worsening shortness of breath, subjective fever, cough, and weakness.  Patient's husband at bedside states that she has had increasing confusion as well.  Also has known sick contact of a daughter with an upper respiratory infection over the weekend.  Patient denies any missed dialysis sessions and stated that she was dialyzed yesterday with a full session. ROS: Patient currently denies any vision changes, tinnitus, difficulty speaking, facial droop, chest pain, abdominal pain, vomiting/diarrhea, dysuria, or numbness/paresthesias in any extremity   Physical Exam  Triage Vital Signs: ED Triage Vitals  Encounter Vitals Group     BP 08/21/23 1957 119/60     Girls Systolic BP Percentile --      Girls Diastolic BP Percentile --      Boys Systolic BP Percentile --      Boys Diastolic BP Percentile --      Pulse Rate 08/21/23 1957 (!) 144     Resp 08/21/23 1957 18     Temp 08/21/23 1957 99.1 F (37.3 C)     Temp Source 08/21/23 1957 Oral     SpO2 08/21/23 1957 98 %     Weight 08/21/23 1956 180 lb (81.6 kg)     Height 08/21/23 1956 5' 7 (1.702 m)     Head Circumference --      Peak Flow --      Pain Score 08/21/23 1955 4     Pain Loc --      Pain Education --      Exclude from Growth Chart --    Most recent vital signs: Vitals:   08/22/23 1956 08/22/23 2029  BP: (!) 139/41   Pulse: 68   Resp: 19   Temp: 99.1 F (37.3 C)   SpO2: 95% 94%   General: Awake, oriented x4. CV:  Good peripheral perfusion. Resp:  Increased effort.  Rales over bilateral lung fields Abd:  No distention. Other:  Elderly  overweight African-American female resting comfortably in no acute distress ED Results / Procedures / Treatments  Labs (all labs ordered are listed, but only abnormal results are displayed) Labs Reviewed  CBC WITH DIFFERENTIAL/PLATELET - Abnormal; Notable for the following components:      Result Value   WBC 13.0 (*)    RBC 3.37 (*)    Hemoglobin 9.3 (*)    HCT 30.5 (*)    RDW 18.6 (*)    Neutro Abs 9.4 (*)    Abs Immature Granulocytes 0.13 (*)    All other components within normal limits  BASIC METABOLIC PANEL WITH GFR - Abnormal; Notable for the following components:   Potassium 3.3 (*)    Glucose, Bld 118 (*)    BUN 41 (*)    Creatinine, Ser 6.12 (*)    Calcium  8.5 (*)    GFR, Estimated 6 (*)    All other components within normal limits  BRAIN NATRIURETIC PEPTIDE - Abnormal; Notable for the following components:   B Natriuretic Peptide 2,260.1 (*)    All other components within normal limits  PROTIME-INR - Abnormal; Notable for the following components:   Prothrombin Time 15.5 (*)    All other  components within normal limits  PHOSPHORUS - Abnormal; Notable for the following components:   Phosphorus 4.8 (*)    All other components within normal limits  VITAMIN D  25 HYDROXY (VIT D DEFICIENCY, FRACTURES) - Abnormal; Notable for the following components:   Vit D, 25-Hydroxy 23.09 (*)    All other components within normal limits  CBC - Abnormal; Notable for the following components:   RBC 2.92 (*)    Hemoglobin 7.8 (*)    HCT 26.8 (*)    MCHC 29.1 (*)    RDW 18.3 (*)    All other components within normal limits  RENAL FUNCTION PANEL - Abnormal; Notable for the following components:   Potassium 3.2 (*)    Glucose, Bld 69 (*)    BUN 46 (*)    Creatinine, Ser 6.60 (*)    Calcium  8.1 (*)    Phosphorus 4.9 (*)    Albumin 2.6 (*)    GFR, Estimated 6 (*)    Anion gap 17 (*)    All other components within normal limits  CBG MONITORING, ED - Abnormal; Notable for the  following components:   Glucose-Capillary 117 (*)    All other components within normal limits  TROPONIN I (HIGH SENSITIVITY) - Abnormal; Notable for the following components:   Troponin I (High Sensitivity) 35 (*)    All other components within normal limits  TROPONIN I (HIGH SENSITIVITY) - Abnormal; Notable for the following components:   Troponin I (High Sensitivity) 32 (*)    All other components within normal limits  RESP PANEL BY RT-PCR (RSV, FLU A&B, COVID)  [id number]  EXPECTORATED SPUTUM ASSESSMENT W GRAM STAIN, RFLX TO RESP C  MRSA NEXT GEN BY PCR, NASAL  LACTIC ACID, PLASMA  PATHOLOGIST SMEAR REVIEW  CORTISOL-AM, BLOOD  MAGNESIUM   FOLATE  GLUCOSE, CAPILLARY  BASIC METABOLIC PANEL WITH GFR  CBC  MAGNESIUM   PHOSPHORUS   EKG ED ECG REPORT I, Artist MARLA Kerns, the attending physician, personally viewed and interpreted this ECG. Date: 08/21/2023 EKG Time: 1957 Rate: 145 Rhythm: Tachycardic sinus rhythm QRS Axis: normal Intervals: normal ST/T Wave abnormalities: normal Narrative Interpretation: Tachycardic sinus rhythm.  No evidence of acute ischemia RADIOLOGY ED MD interpretation: Single view portable chest x-ray shows bilateral perihilar interstitial opacities which may present atypical/viral infection or interstitial edema there is also patchy alveolar opacities in the right mid and lower lung zones worrisome for superimposed bronchopneumonia - All radiology independently interpreted and agree with radiology assessment Official radiology report(s): No results found.  PROCEDURES: Critical Care performed: Yes, see critical care procedure note(s) Procedures CRITICAL CARE Performed by: Aveen Stansel K Nelsie Domino  Total critical care time: 41 minutes  Critical care time was exclusive of separately billable procedures and treating other patients.  Critical care was necessary to treat or prevent imminent or life-threatening deterioration.  Critical care was time spent personally by  me on the following activities: development of treatment plan with patient and/or surrogate as well as nursing, discussions with consultants, evaluation of patient's response to treatment, examination of patient, obtaining history from patient or surrogate, ordering and performing treatments and interventions, ordering and review of laboratory studies, ordering and review of radiographic studies, pulse oximetry and re-evaluation of patient's condition.  MEDICATIONS ORDERED IN ED: Medications  aspirin  chewable tablet 81 mg (81 mg Oral Given 08/22/23 1100)  hydrALAZINE  (APRESOLINE ) tablet 100 mg (100 mg Oral Given 08/22/23 2111)  donepezil  (ARICEPT ) tablet 10 mg (10 mg Oral Given 08/22/23 2111)  pantoprazole  (PROTONIX ) EC  tablet 40 mg (40 mg Oral Given 08/22/23 1100)  ferrous sulfate  tablet 325 mg (325 mg Oral Given 08/22/23 0826)  heparin  injection 5,000 Units (5,000 Units Subcutaneous Given 08/22/23 2114)  cefTRIAXone  (ROCEPHIN ) 2 g in sodium chloride  0.9 % 100 mL IVPB (has no administration in time range)  azithromycin  (ZITHROMAX ) 500 mg in sodium chloride  0.9 % 250 mL IVPB (has no administration in time range)  acetaminophen  (TYLENOL ) tablet 650 mg (has no administration in time range)    Or  acetaminophen  (TYLENOL ) suppository 650 mg (has no administration in time range)  ondansetron  (ZOFRAN ) tablet 4 mg (has no administration in time range)    Or  ondansetron  (ZOFRAN ) injection 4 mg (has no administration in time range)  guaiFENesin  (MUCINEX ) 12 hr tablet 600 mg (600 mg Oral Given 08/22/23 2112)  budesonide -glycopyrrolate -formoterol  (BREZTRI ) 160-9-4.8 MCG/ACT inhaler 2 puff (2 puffs Inhalation Given 08/22/23 1059)  guaiFENesin -dextromethorphan  (ROBITUSSIN DM) 100-10 MG/5ML syrup 5 mL (5 mLs Oral Given 08/22/23 2112)  Chlorhexidine  Gluconate Cloth 2 % PADS 6 each (has no administration in time range)  dextrose  50 % solution 12.5 g (12.5 g Intravenous Not Given 08/22/23 1808)  insulin  aspart  (novoLOG ) injection 0-9 Units (has no administration in time range)  methylPREDNISolone  sodium succinate (SOLU-MEDROL ) 125 mg/2 mL injection 80 mg (80 mg Intravenous Given 08/22/23 2117)    Followed by  methylPREDNISolone  sodium succinate (SOLU-MEDROL ) 40 mg/mL injection 40 mg (has no administration in time range)    Followed by  predniSONE  (DELTASONE ) tablet 40 mg (has no administration in time range)  ipratropium-albuterol  (DUONEB) 0.5-2.5 (3) MG/3ML nebulizer solution 3 mL ( Nebulization Canceled Entry 08/22/23 2058)  lactated ringers  bolus 2,000 mL (0 mLs Intravenous Stopped 08/21/23 2240)  iohexol  (OMNIPAQUE ) 350 MG/ML injection 75 mL (75 mLs Intravenous Contrast Given 08/21/23 2131)  cefTRIAXone  (ROCEPHIN ) 1 g in sodium chloride  0.9 % 100 mL IVPB (0 g Intravenous Stopped 08/21/23 2339)  azithromycin  (ZITHROMAX ) 500 mg in sodium chloride  0.9 % 250 mL IVPB (0 mg Intravenous Stopped 08/22/23 0107)  cefTRIAXone  (ROCEPHIN ) 1 g in sodium chloride  0.9 % 100 mL IVPB (0 g Intravenous Stopped 08/22/23 0255)   IMPRESSION / MDM / ASSESSMENT AND PLAN / ED COURSE  I reviewed the triage vital signs and the nursing notes.                             The patient is on the cardiac monitor to evaluate for evidence of arrhythmia and/or significant heart rate changes. Patient's presentation is most consistent with acute presentation with potential threat to life or bodily function. Presents with shortness of breath, cough, and malaise concerning for pneumonia.  DDx: PE, COPD exacerbation, Pneumothorax, TB, Atypical ACS, Esophageal Rupture, Toxic Exposure, Foreign Body Airway Obstruction.  Workup: CXR CBC, CMP, lactate, troponin  Given History, Exam, and Workup presentation most consistent with pneumonia.  Findings: Multifocal pneumonia  Tx: Ceftriaxone  1g IV Azithromycin  500mg  IV  Reassessment: As patient is continuing to require supplemental oxygenation for acute hypoxic respiratory failure, patient  will require admission to the internal medicine service for further evaluation and management  Disposition: Admit   FINAL CLINICAL IMPRESSION(S) / ED DIAGNOSES   Final diagnoses:  Multifocal pneumonia  Acute respiratory failure with hypoxia (HCC)   Rx / DC Orders   ED Discharge Orders     None      Note:  This document was prepared using Dragon voice recognition software and may include  unintentional dictation errors.   Lotus Gover K, MD 08/23/23 [phone]

## 2023-08-21 NOTE — Assessment & Plan Note (Signed)
 Sliding scale insulin  coverage

## 2023-08-21 NOTE — Assessment & Plan Note (Addendum)
 History of upper GI bleed 05/2023 (AVM) No acute issues suspected Continue Protonix , ferrous sulfate 

## 2023-08-22 ENCOUNTER — Telehealth: Payer: Self-pay | Admitting: *Deleted

## 2023-08-22 ENCOUNTER — Inpatient Hospital Stay

## 2023-08-22 DIAGNOSIS — E1151 Type 2 diabetes mellitus with diabetic peripheral angiopathy without gangrene: Secondary | ICD-10-CM | POA: Diagnosis present

## 2023-08-22 DIAGNOSIS — E1122 Type 2 diabetes mellitus with diabetic chronic kidney disease: Secondary | ICD-10-CM | POA: Diagnosis present

## 2023-08-22 DIAGNOSIS — Z794 Long term (current) use of insulin: Secondary | ICD-10-CM | POA: Diagnosis not present

## 2023-08-22 DIAGNOSIS — G928 Other toxic encephalopathy: Secondary | ICD-10-CM | POA: Diagnosis present

## 2023-08-22 DIAGNOSIS — J441 Chronic obstructive pulmonary disease with (acute) exacerbation: Secondary | ICD-10-CM | POA: Diagnosis not present

## 2023-08-22 DIAGNOSIS — R4182 Altered mental status, unspecified: Secondary | ICD-10-CM | POA: Diagnosis present

## 2023-08-22 DIAGNOSIS — A419 Sepsis, unspecified organism: Secondary | ICD-10-CM | POA: Diagnosis present

## 2023-08-22 DIAGNOSIS — J440 Chronic obstructive pulmonary disease with acute lower respiratory infection: Secondary | ICD-10-CM | POA: Diagnosis present

## 2023-08-22 DIAGNOSIS — Z992 Dependence on renal dialysis: Secondary | ICD-10-CM | POA: Diagnosis not present

## 2023-08-22 DIAGNOSIS — Z7902 Long term (current) use of antithrombotics/antiplatelets: Secondary | ICD-10-CM | POA: Diagnosis not present

## 2023-08-22 DIAGNOSIS — J189 Pneumonia, unspecified organism: Secondary | ICD-10-CM | POA: Diagnosis present

## 2023-08-22 DIAGNOSIS — J9621 Acute and chronic respiratory failure with hypoxia: Secondary | ICD-10-CM | POA: Diagnosis present

## 2023-08-22 DIAGNOSIS — N2581 Secondary hyperparathyroidism of renal origin: Secondary | ICD-10-CM | POA: Diagnosis present

## 2023-08-22 DIAGNOSIS — E785 Hyperlipidemia, unspecified: Secondary | ICD-10-CM | POA: Diagnosis present

## 2023-08-22 DIAGNOSIS — I6523 Occlusion and stenosis of bilateral carotid arteries: Secondary | ICD-10-CM | POA: Diagnosis present

## 2023-08-22 DIAGNOSIS — F0390 Unspecified dementia without behavioral disturbance: Secondary | ICD-10-CM | POA: Diagnosis present

## 2023-08-22 DIAGNOSIS — Z95828 Presence of other vascular implants and grafts: Secondary | ICD-10-CM | POA: Diagnosis not present

## 2023-08-22 DIAGNOSIS — F05 Delirium due to known physiological condition: Secondary | ICD-10-CM | POA: Diagnosis not present

## 2023-08-22 DIAGNOSIS — N186 End stage renal disease: Secondary | ICD-10-CM | POA: Diagnosis present

## 2023-08-22 DIAGNOSIS — Z79811 Long term (current) use of aromatase inhibitors: Secondary | ICD-10-CM | POA: Diagnosis not present

## 2023-08-22 DIAGNOSIS — Z1152 Encounter for screening for COVID-19: Secondary | ICD-10-CM | POA: Diagnosis not present

## 2023-08-22 DIAGNOSIS — I2510 Atherosclerotic heart disease of native coronary artery without angina pectoris: Secondary | ICD-10-CM | POA: Diagnosis present

## 2023-08-22 DIAGNOSIS — I2489 Other forms of acute ischemic heart disease: Secondary | ICD-10-CM | POA: Diagnosis present

## 2023-08-22 DIAGNOSIS — D631 Anemia in chronic kidney disease: Secondary | ICD-10-CM | POA: Diagnosis present

## 2023-08-22 DIAGNOSIS — I132 Hypertensive heart and chronic kidney disease with heart failure and with stage 5 chronic kidney disease, or end stage renal disease: Secondary | ICD-10-CM | POA: Diagnosis present

## 2023-08-22 LAB — VITAMIN D 25 HYDROXY (VIT D DEFICIENCY, FRACTURES): Vit D, 25-Hydroxy: 23.09 ng/mL — ABNORMAL LOW (ref 30–100)

## 2023-08-22 LAB — CBC
HCT: 26.8 % — ABNORMAL LOW (ref 36.0–46.0)
Hemoglobin: 7.8 g/dL — ABNORMAL LOW (ref 12.0–15.0)
MCH: 26.7 pg (ref 26.0–34.0)
MCHC: 29.1 g/dL — ABNORMAL LOW (ref 30.0–36.0)
MCV: 91.8 fL (ref 80.0–100.0)
Platelets: 201 K/uL (ref 150–400)
RBC: 2.92 MIL/uL — ABNORMAL LOW (ref 3.87–5.11)
RDW: 18.3 % — ABNORMAL HIGH (ref 11.5–15.5)
WBC: 9 K/uL (ref 4.0–10.5)
nRBC: 0 % (ref 0.0–0.2)

## 2023-08-22 LAB — PROTIME-INR
INR: 1.2 (ref 0.8–1.2)
Prothrombin Time: 15.5 s — ABNORMAL HIGH (ref 11.4–15.2)

## 2023-08-22 LAB — RENAL FUNCTION PANEL
Albumin: 2.6 g/dL — ABNORMAL LOW (ref 3.5–5.0)
Anion gap: 17 — ABNORMAL HIGH (ref 5–15)
BUN: 46 mg/dL — ABNORMAL HIGH (ref 8–23)
CO2: 23 mmol/L (ref 22–32)
Calcium: 8.1 mg/dL — ABNORMAL LOW (ref 8.9–10.3)
Chloride: 101 mmol/L (ref 98–111)
Creatinine, Ser: 6.6 mg/dL — ABNORMAL HIGH (ref 0.44–1.00)
GFR, Estimated: 6 mL/min — ABNORMAL LOW (ref >60)
Glucose, Bld: 69 mg/dL — ABNORMAL LOW (ref 70–99)
Phosphorus: 4.9 mg/dL — ABNORMAL HIGH (ref 2.5–4.6)
Potassium: 3.2 mmol/L — ABNORMAL LOW (ref 3.5–5.1)
Sodium: 141 mmol/L (ref 135–145)

## 2023-08-22 LAB — GLUCOSE, CAPILLARY: Glucose-Capillary: 94 mg/dL (ref 70–99)

## 2023-08-22 LAB — PATHOLOGIST SMEAR REVIEW

## 2023-08-22 LAB — CORTISOL-AM, BLOOD: Cortisol - AM: 10.6 ug/dL (ref 6.7–22.6)

## 2023-08-22 LAB — CBG MONITORING, ED: Glucose-Capillary: 117 mg/dL — ABNORMAL HIGH (ref 70–99)

## 2023-08-22 LAB — FOLATE: Folate: 8.9 ng/mL (ref >5.9)

## 2023-08-22 LAB — MAGNESIUM: Magnesium: 2.2 mg/dL (ref 1.7–2.4)

## 2023-08-22 LAB — PHOSPHORUS: Phosphorus: 4.8 mg/dL — ABNORMAL HIGH (ref 2.5–4.6)

## 2023-08-22 MED ORDER — IPRATROPIUM-ALBUTEROL 0.5-2.5 (3) MG/3ML IN SOLN
3.0000 mL | Freq: Four times a day (QID) | RESPIRATORY_TRACT | Status: DC
  Administered 2023-08-22: 3 mL via RESPIRATORY_TRACT
  Filled 2023-08-22: qty 3

## 2023-08-22 MED ORDER — METHYLPREDNISOLONE SODIUM SUCC 125 MG IJ SOLR
80.0000 mg | Freq: Once | INTRAMUSCULAR | Status: AC
  Administered 2023-08-22: 80 mg via INTRAVENOUS
  Filled 2023-08-22: qty 2

## 2023-08-22 MED ORDER — INSULIN ASPART 100 UNIT/ML IJ SOLN
0.0000 [IU] | Freq: Three times a day (TID) | INTRAMUSCULAR | Status: DC
  Administered 2023-08-23: 3 [IU] via SUBCUTANEOUS
  Administered 2023-08-23 – 2023-08-24 (×4): 2 [IU] via SUBCUTANEOUS
  Administered 2023-08-25: 1 [IU] via SUBCUTANEOUS
  Administered 2023-08-25: 3 [IU] via SUBCUTANEOUS
  Administered 2023-08-25 – 2023-08-26 (×3): 2 [IU] via SUBCUTANEOUS
  Filled 2023-08-22 (×10): qty 1

## 2023-08-22 MED ORDER — PREDNISONE 20 MG PO TABS
40.0000 mg | ORAL_TABLET | Freq: Every day | ORAL | Status: AC
  Administered 2023-08-24 – 2023-08-26 (×3): 40 mg via ORAL
  Filled 2023-08-22 (×3): qty 2

## 2023-08-22 MED ORDER — GUAIFENESIN-DM 100-10 MG/5ML PO SYRP
5.0000 mL | ORAL_SOLUTION | Freq: Four times a day (QID) | ORAL | Status: DC | PRN
  Administered 2023-08-22 – 2023-08-25 (×2): 5 mL via ORAL
  Filled 2023-08-22 (×2): qty 10

## 2023-08-22 MED ORDER — IPRATROPIUM-ALBUTEROL 0.5-2.5 (3) MG/3ML IN SOLN
3.0000 mL | Freq: Four times a day (QID) | RESPIRATORY_TRACT | Status: DC
  Administered 2023-08-23 (×2): 3 mL via RESPIRATORY_TRACT
  Filled 2023-08-22 (×2): qty 3

## 2023-08-22 MED ORDER — HEPARIN SODIUM (PORCINE) 1000 UNIT/ML IJ SOLN
INTRAMUSCULAR | Status: AC
  Filled 2023-08-22: qty 4

## 2023-08-22 MED ORDER — CHLORHEXIDINE GLUCONATE CLOTH 2 % EX PADS
6.0000 | MEDICATED_PAD | Freq: Every day | CUTANEOUS | Status: DC
  Administered 2023-08-23 – 2023-08-27 (×5): 6 via TOPICAL
  Filled 2023-08-22: qty 6

## 2023-08-22 MED ORDER — BUDESON-GLYCOPYRROL-FORMOTEROL 160-9-4.8 MCG/ACT IN AERO
2.0000 | INHALATION_SPRAY | Freq: Two times a day (BID) | RESPIRATORY_TRACT | Status: DC
  Administered 2023-08-22 – 2023-08-27 (×10): 2 via RESPIRATORY_TRACT
  Filled 2023-08-22 (×2): qty 5.9

## 2023-08-22 MED ORDER — METHYLPREDNISOLONE SODIUM SUCC 40 MG IJ SOLR
40.0000 mg | Freq: Two times a day (BID) | INTRAMUSCULAR | Status: AC
  Administered 2023-08-23 (×2): 40 mg via INTRAVENOUS
  Filled 2023-08-22 (×2): qty 1

## 2023-08-22 MED ORDER — SODIUM CHLORIDE 0.9 % IV SOLN
1.0000 g | Freq: Once | INTRAVENOUS | Status: AC
  Administered 2023-08-22: 1 g via INTRAVENOUS
  Filled 2023-08-22: qty 10

## 2023-08-22 MED ORDER — DEXTROSE 50 % IV SOLN
12.5000 g | INTRAVENOUS | Status: AC
  Filled 2023-08-22: qty 25

## 2023-08-22 NOTE — ED Notes (Signed)
 Pt ambulated to the bathroom with assist to void.  Pt has a wet sounding cough

## 2023-08-22 NOTE — Telephone Encounter (Signed)
 The husband called today and said that she is in the hospital and will have to cancel 8/14/ and 8/15. He was scheduled for 814 for labs and Venofer  and 815 for possible transfusion if needed.

## 2023-08-22 NOTE — Progress Notes (Signed)
 [facility]  ROUNDING NOTE   Subjective:   Jennifer Carrillo 89 y.o. female with medical history significant of ESRD on HD TTS, HTN, PVD s/p multiple stenting procedures on Plavix , multivessel CAD with known RCA occlusion with collaterals, IDDM, chronic iron  deficiency anemia no longer on iron  supplement, COPD with chronic hypoxic respiratory failure on 2 L nightly, dementia, sent from dialysis center for evaluation of Sepsis due to pneumonia (HCC) [J18.9, A41.9] CAP (community acquired pneumonia) [J18.9]  Patient receives outpatient dialysis treatments at [facility] on a TTS schedule, supervised by [facility] physicians.  Last treatment received on Tuesday.  Patient states she has been feeling bad for the past few days.  Recently diagnosed with upper respiratory infection.  According to triage note, patient's daughter reports confusion and increased weakness.  Labs on ED arrival unremarkable for renal patient.  BNP greater than 2200 with troponin 35.  White count elevated 13.0 with hemoglobin 9.3.  Respiratory panel negative for influenza, COVID-19, and RSV.  Chest x-ray shows interstitial opacities which may represent atypical/viral infection or interstitial edema.  CT angio chest with and without contrast showed no evidence of pulmonary embolism however multifocal patchy airspace ground glass and tree-in-bud opacities concerning for multifocal pneumonia.  We have been consulted to manage dialysis needs.   Objective:  Vital signs in last 24 hours:  Temp:  [97.7 F (36.5 C)-99.1 F (37.3 C)] 97.8 F (36.6 C) (08/14 1234) Pulse Rate:  [57-147] 64 (08/14 1300) Resp:  [18-32] 25 (08/14 1300) BP: (119-158)/(41-76) 158/48 (08/14 1300) SpO2:  [95 %-100 %] 95 % (08/14 1300) Weight:  [81.6 kg] 81.6 kg (08/13 1956)  Weight change:  Filed Weights   08/21/23 1956  Weight: 81.6 kg    Intake/Output: I/O last 3 completed shifts: In: 600 [IV Piggyback:600] Out: -    Intake/Output  this shift:  No intake/output data recorded.  Physical Exam: General: NAD  Head: Normocephalic, atraumatic. Moist oral mucosal membranes  Eyes: Anicteric  Neck: Supple  Lungs:  [place], [place] O2  Heart: Regular rate and rhythm  Abdomen:  Soft, nontender  Extremities:  No peripheral edema.  Neurologic: Awake, alert, conversant  Skin: Warm,dry, no rash  Access: Rt permcath    Basic Metabolic Panel: Recent Labs  Lab 08/21/23 2009  NA 141  K 3.3*  CL 103  CO2 23  GLUCOSE 118*  BUN 41*  CREATININE 6.12*  CALCIUM  8.5*    Liver Function Tests: No results for input(s): AST, ALT, ALKPHOS, BILITOT, PROT, ALBUMIN in the last 168 hours. No results for input(s): LIPASE, AMYLASE in the last 168 hours. No results for input(s): AMMONIA in the last 168 hours.  CBC: Recent Labs  Lab 08/16/23 1421 08/21/23 2009 08/22/23 1245  WBC  --  13.0* 9.0  NEUTROABS  --  9.4*  --   HGB 8.1* 9.3* 7.8*  HCT 26.6* 30.5* 26.8*  MCV  --  90.5 91.8  PLT  --  226 201    Cardiac Enzymes: No results for input(s): CKTOTAL, CKMB, CKMBINDEX, TROPONINI in the last 168 hours.  BNP: Invalid input(s): POCBNP  CBG: No results for input(s): GLUCAP in the last 168 hours.  Microbiology: Results for orders placed or performed during the hospital encounter of 08/21/23  Resp panel by RT-PCR (RSV, Flu A&B, Covid) Anterior Nasal Swab     Status: None   Collection Time: 08/21/23  8:09 PM   Specimen: Anterior Nasal Swab  Result Value Ref Range Status   SARS Coronavirus  2 by RT PCR NEGATIVE NEGATIVE Final    Comment: (NOTE) SARS-CoV-2 target nucleic acids are NOT DETECTED.  The SARS-CoV-2 RNA is generally detectable in upper respiratory specimens during the acute phase of infection. The lowest concentration of SARS-CoV-2 viral copies this assay can detect is 138 copies/mL. A negative result does not preclude SARS-Cov-2 infection and should not be used as the sole basis for  treatment or other patient management decisions. A negative result may occur with  improper specimen collection/handling, submission of specimen other than nasopharyngeal swab, presence of viral mutation(s) within the areas targeted by this assay, and inadequate number of viral copies(<138 copies/mL). A negative result must be combined with clinical observations, patient history, and epidemiological information. The expected result is Negative.  Fact Sheet for Patients:  BloggerCourse.com  Fact Sheet for Healthcare Providers:  SeriousBroker.it  This test is no t yet approved or cleared by the [place]  FDA and  has been authorized for detection and/or diagnosis of SARS-CoV-2 by FDA under an Emergency Use Authorization (EUA). This EUA will remain  in effect (meaning this test can be used) for the duration of the COVID-19 declaration under Section 564(b)(1) of the Act, 21 U.S.C.section 360bbb-3(b)(1), unless the authorization is terminated  or revoked sooner.       Influenza A by PCR NEGATIVE NEGATIVE Final   Influenza B by PCR NEGATIVE NEGATIVE Final    Comment: (NOTE) The Xpert Xpress SARS-CoV-2/FLU/RSV plus assay is intended as an aid in the diagnosis of influenza from Nasopharyngeal swab specimens and should not be used as a sole basis for treatment. Nasal washings and aspirates are unacceptable for Xpert Xpress SARS-CoV-2/FLU/RSV testing.  Fact Sheet for Patients: BloggerCourse.com  Fact Sheet for Healthcare Providers: SeriousBroker.it  This test is not yet approved or cleared by the [place]  FDA and has been authorized for detection and/or diagnosis of SARS-CoV-2 by FDA under an Emergency Use Authorization (EUA). This EUA will remain in effect (meaning this test can be used) for the duration of the COVID-19 declaration under Section 564(b)(1) of the Act, 21  U.S.C. section 360bbb-3(b)(1), unless the authorization is terminated or revoked.     Resp Syncytial Virus by PCR NEGATIVE NEGATIVE Final    Comment: (NOTE) Fact Sheet for Patients: BloggerCourse.com  Fact Sheet for Healthcare Providers: SeriousBroker.it  This test is not yet approved or cleared by the [place]  FDA and has been authorized for detection and/or diagnosis of SARS-CoV-2 by FDA under an Emergency Use Authorization (EUA). This EUA will remain in effect (meaning this test can be used) for the duration of the COVID-19 declaration under Section 564(b)(1) of the Act, 21 U.S.C. section 360bbb-3(b)(1), unless the authorization is terminated or revoked.  Performed at [facility], [address] [place], [place] [address]     Coagulation Studies: Recent Labs    08/22/23 0455  LABPROT 15.5*  INR 1.2    Urinalysis: No results for input(s): COLORURINE, LABSPEC, PHURINE, GLUCOSEU, HGBUR, BILIRUBINUR, KETONESUR, PROTEINUR, UROBILINOGEN, NITRITE, LEUKOCYTESUR in the last 72 hours.  Invalid input(s): APPERANCEUR    Imaging: CT Angio Chest PE W/Cm &/Or Wo Cm Result Date: 08/21/2023 CLINICAL DATA:  High probability for PE. Upper respiratory infection. EXAM: CT ANGIOGRAPHY CHEST WITH CONTRAST TECHNIQUE: Multidetector CT imaging of the chest was performed using the standard protocol during bolus administration of intravenous contrast. Multiplanar CT image reconstructions and MIPs were obtained to evaluate the vascular anatomy. RADIATION DOSE REDUCTION: This exam was performed according to the departmental dose-optimization program  which includes automated exposure control, adjustment of the mA and/or kV according to patient size and/or use of iterative reconstruction technique. CONTRAST:  75mL OMNIPAQUE  IOHEXOL  350 MG/ML SOLN COMPARISON:  CT of the chest 10/28/2021. FINDINGS: Cardiovascular:  Heart is enlarged. Aorta is normal in size. There is no pericardial effusion. There are atherosclerotic calcifications of the aorta and coronary arteries. There is adequate opacification of the pulmonary arteries to the segmental level. There is no pulmonary embolism identified. Right-sided central venous catheter tip ends in the superior portion of the right atrium. Mediastinum/Nodes: There are prominent, but nonenlarged AP window lymph nodes, paratracheal lymph nodes and right hilar lymph nodes. Visualized esophagus and thyroid  gland are within normal limits. Lungs/Pleura: Multifocal patchy airspace, ground-glass and tree-in-bud opacities are seen throughout both lungs sparing the lung apices. There is no pleural effusion or pneumothorax. There is central peribronchial wall and bilateral lower lobe peribronchial wall thickening. Upper Abdomen: No acute abnormality. There is thickening of the bilateral adrenal glands nonspecific and unchanged. Musculoskeletal: No chest wall abnormality. No acute or significant osseous findings. Review of the MIP images confirms the above findings. IMPRESSION: 1. No evidence for pulmonary embolism. 2. Multifocal patchy airspace, ground-glass and tree-in-bud opacities throughout both lungs worrisome for multifocal pneumonia. 3. Central peribronchial wall thickening compatible with bronchitis. 4. Cardiomegaly. 5. Aortic atherosclerosis. Aortic Atherosclerosis (ICD10-I70.0). Electronically Signed   By: Greig Pique M.D.   On: 08/21/2023 22:32   DG Chest 1 View Result Date: 08/21/2023 CLINICAL DATA:  10031 Cough 10031 EXAM: CHEST  1 VIEW COMPARISON:  May 25, 2023 FINDINGS: Diffuse bilateral perihilar interstitial opacities. Patchy alveolar opacities in the right mid and lower lung zones. No pleural effusion or pneumothorax. Right hemodialysis catheter terminates in the high right atrium. Mild cardiomegaly.Choose 3no acute fracture or destructive lesion. IMPRESSION: Bilateral  perihilar interstitial opacities, which may represent atypical/viral infection or interstitial edema. Patchy alveolar opacities in the right mid and lower lung zones, worrisome for a superimposed bronchopneumonia. Electronically Signed   By: Rogelia Myers M.D.   On: 08/21/2023 20:30     Medications:    [START ON 08/23/2023] azithromycin      [START ON 08/23/2023] cefTRIAXone  (ROCEPHIN )  IV      aspirin   81 mg Oral Daily   budesonide -glycopyrrolate -formoterol   2 puff Inhalation BID   Chlorhexidine  Gluconate Cloth  6 each Topical Q0600   donepezil   10 mg Oral QHS   ferrous sulfate   325 mg Oral Q breakfast   guaiFENesin   600 mg Oral BID   heparin   5,000 Units Subcutaneous Q8H   hydrALAZINE   100 mg Oral BID   pantoprazole   40 mg Oral Daily   acetaminophen  **OR** acetaminophen , albuterol , guaiFENesin -dextromethorphan , ondansetron  **OR** ondansetron  (ZOFRAN ) IV  Assessment/ Plan:  Jennifer Carrillo is a 89 y.o.  female medical history significant of ESRD on HD TTS, HTN, PVD s/p multiple stenting procedures on Plavix , multivessel CAD with known RCA occlusion with collaterals, IDDM, chronic iron  deficiency anemia no longer on iron  supplement, COPD with chronic hypoxic respiratory failure on 2 L nightly, dementia, sent from dialysis center for evaluation of Sepsis due to pneumonia (HCC) [J18.9, A41.9] CAP (community acquired pneumonia) [J18.9]  [facility]/TTS/Rt permcath  Sepsis likely secondary to pneumonia. CT angio chest concerning for multifocal pneumonia Primary team has started antibiotics, Rocephin  and azithromycin .   2. End stage renal disease on hemodialysis. Last treatment received on Tuesday. Will receive scheduled treatment today, UF 0.5-1. Now held due to cramping.   3.  Anemia of chronic kidney disease Lab Results  Component Value Date   HGB 7.8 (L) 08/22/2023    Hgb 7.8, patient receives mircera at outpatient clinic  4. Secondary Hyperparathyroidism: with  outpatient labs:unavailble Lab Results  Component Value Date   PTH 35 06/27/2018   CALCIUM  8.5 (L) 08/21/2023   PHOS 3.4 12/30/2020    Will continue to monitor bone minerals.    LOS: 0 Alanmichael Barmore 8/14/20251:26 PM

## 2023-08-22 NOTE — Progress Notes (Signed)
  Received patient in bed to unit.   Informed consent signed and in chart.    TX duration: 3.5     Transported by [employer] given to patient's nurse. Amber  30H    Access used: CVC Dressing changed Access issues none   Total UF removed:  Medication(s) given: none  Post HD weight:   Cramping in left leg after treatment. Extra saline given   Hunter Hacking LPN [facility] D[facility] Unit

## 2023-08-22 NOTE — Progress Notes (Signed)
 [facility] Progress Note  Patient: Jennifer Carrillo    FMW:[id number]  DOA: 08/21/2023     Date of Service: the patient was seen and examined on 08/22/2023  Chief Complaint  Patient presents with   Altered Mental Status   Brief hospital course: Jennifer Carrillo is a 89 y.o. female with medical history significant for HTN, ESRD on HD TTS, CAD, PVD/carotid artery disease s/p stenting, COPD on home O2 2 L, dementia, upper GI bleed from gastric AVM (05/2023), being admitted with sepsis secondary to multifocal pneumonia after presenting with 2-day history of cough and congestion and now weakness and confusion.  She has had no chest pain, fever or chills. In the ED, Tmax 99.1 and tachycardic to the 130s and 140s, tachypneic to 28, O2 sats near 100% on 2 L Labs notable for WBC 13,000 with lactic acid 1.2 and negative respiratory viral panel. Troponin 35 and BNP 2260 Hemoglobin at baseline at 9.3 Potassium 3.3.  Renal function reflecting dialysis status EKG showed sinus tachycardia with RBBB, T wave inversion in lateral leads. CTA chest negative for PE with findings concerning for multifocal patchy airspace disease worrisome for multifocal pneumonia. Patient started on Rocephin  and azithromycin .  Initiated on sepsis fluids Admission requested    Assessment and Plan: Sepsis due to pneumonia (HCC) Sepsis criteria include fever, tachycardia and tachypnea with leukocytosis, altered mental status  History of affected contacts per respiratory viral panel negative Rocephin  and azithromycin  Received an initial fluid bolus, will rebolus as needed in view of dialysis status Antitussives, DuoNebs as needed SLP consult and aspiration precaution 8/14 Solu-Medrol  IV 80 mg x 1 dose followed by 40 mg IV twice daily followed by prednisone  40 mg p.o. daily for 3 days Continue Breztri  inhaler Continue spirometry, flutter valve Follow sputum culture and blood culture   Dementia (HCC) Mild  delirium/acute toxic metabolic encephalopathy Patient presented with confusion in the setting of acute infection Neurologic checks, fall and aspiration precautions Continue Aricept    COPD (chronic obstructive pulmonary disease) (HCC) Chronic respiratory failure with hypoxia Acute exacerbation not suspected As needed DuoNebs Supplemental oxygen Started steroids as above   ESRD on hemodialysis [facility]) Nephrology consult for continuation of dialysis No missed or incomplete dialysis sessions HD TTS schedule   Coronary artery disease involving native coronary artery of native heart Elevated troponin of 35, suspect demand No complaints of chest pain, EKG nonacute Continue aspirin , atorvastatin  and Imdur    PAD (HCC) History of stenting of carotid artery, femoral artery and renal artery No acute issues Continue aspirin  and statin   AVM (arteriovenous malformation) of duodenum, acquired History of upper GI bleed 05/2023 (AVM) No acute issues suspected Continue Protonix , ferrous sulfate    Anemia of chronic kidney failure, stage 5 (HCC) Iron  deficiency, TSAT 10%, folic acid  within normal range. Continue oral iron  supplement Monitor H&H      Body mass index is 24.76 kg/m.  Interventions:   Diet: Heart healthy/carb modified DVT Prophylaxis: Subcutaneous Heparin     Advance goals of care discussion: Full code  Family Communication: family was not present at bedside, at the time of interview.  The pt provided permission to discuss medical plan with the family. Opportunity was given to ask question and all questions were answered satisfactorily.   Disposition:  Pt is from home, admitted with pneumonia, respiratory failure, still has shortness of breath and on IV antibiotics, which precludes a safe discharge. Discharge to home, when stable, may need few days to improve.  Subjective: No significant events  overnight, patient is feeling little bit improvement but still has  significant chest congestion, productive cough.  Denied any chest pain.  Physical Exam: General: NAD, lying comfortably Appear in no distress, affect appropriate Eyes: PERRLA ENT: Oral Mucosa Clear, moist  Neck: no JVD,  Cardiovascular: S1 and S2 Present, no Murmur,  Respiratory: Good air entry bilaterally, significant crackles bilaterally in the bases  Abdomen: Bowel Sound present, Soft and no tenderness,  Skin: no rashes Extremities: no Pedal edema, no calf tenderness Neurologic: without any new focal findings Gait not checked due to patient safety concerns  Vitals:   08/22/23 1630 08/22/23 1700 08/22/23 1705 08/22/23 1733  BP: 130/64 121/60 (!) 143/95   Pulse: 70 72 71   Resp: (!) 27 (!) 22 17   Temp:   97.6 F (36.4 C)   TempSrc:   Oral   SpO2: 98% 98% 99%   Weight:    71.7 kg  Height:        Intake/Output Summary (Last 24 hours) at 08/22/2023 1746 Last data filed at 08/22/2023 1705 Gross per 24 hour  Intake 600 ml  Output 700 ml  Net -100 ml   Filed Weights   08/21/23 1956 08/22/23 1733  Weight: 81.6 kg 71.7 kg    Data Reviewed: I have personally reviewed and interpreted daily labs, tele strips, imagings as discussed above. I reviewed all nursing notes, pharmacy notes, vitals, pertinent old records I have discussed plan of care as described above with RN and patient/family.  CBC: Recent Labs  Lab 08/16/23 1421 08/21/23 2009 08/22/23 1245  WBC  --  13.0* 9.0  NEUTROABS  --  9.4*  --   HGB 8.1* 9.3* 7.8*  HCT 26.6* 30.5* 26.8*  MCV  --  90.5 91.8  PLT  --  226 201   Basic Metabolic Panel: Recent Labs  Lab 08/21/23 2009 08/22/23 1245  NA 141 141  K 3.3* 3.2*  CL 103 101  CO2 23 23  GLUCOSE 118* 69*  BUN 41* 46*  CREATININE 6.12* 6.60*  CALCIUM  8.5* 8.1*  MG  --  2.2  PHOS  --  4.8*  4.9*    Studies: CT Angio Chest PE W/Cm &/Or Wo Cm Result Date: 08/21/2023 CLINICAL DATA:  High probability for PE. Upper respiratory infection. EXAM: CT  ANGIOGRAPHY CHEST WITH CONTRAST TECHNIQUE: Multidetector CT imaging of the chest was performed using the standard protocol during bolus administration of intravenous contrast. Multiplanar CT image reconstructions and MIPs were obtained to evaluate the vascular anatomy. RADIATION DOSE REDUCTION: This exam was performed according to the departmental dose-optimization program which includes automated exposure control, adjustment of the mA and/or kV according to patient size and/or use of iterative reconstruction technique. CONTRAST:  75mL OMNIPAQUE  IOHEXOL  350 MG/ML SOLN COMPARISON:  CT of the chest 10/28/2021. FINDINGS: Cardiovascular: Heart is enlarged. Aorta is normal in size. There is no pericardial effusion. There are atherosclerotic calcifications of the aorta and coronary arteries. There is adequate opacification of the pulmonary arteries to the segmental level. There is no pulmonary embolism identified. Right-sided central venous catheter tip ends in the superior portion of the right atrium. Mediastinum/Nodes: There are prominent, but nonenlarged AP window lymph nodes, paratracheal lymph nodes and right hilar lymph nodes. Visualized esophagus and thyroid  gland are within normal limits. Lungs/Pleura: Multifocal patchy airspace, ground-glass and tree-in-bud opacities are seen throughout both lungs sparing the lung apices. There is no pleural effusion or pneumothorax. There is central peribronchial wall and bilateral lower lobe peribronchial  wall thickening. Upper Abdomen: No acute abnormality. There is thickening of the bilateral adrenal glands nonspecific and unchanged. Musculoskeletal: No chest wall abnormality. No acute or significant osseous findings. Review of the MIP images confirms the above findings. IMPRESSION: 1. No evidence for pulmonary embolism. 2. Multifocal patchy airspace, ground-glass and tree-in-bud opacities throughout both lungs worrisome for multifocal pneumonia. 3. Central peribronchial wall  thickening compatible with bronchitis. 4. Cardiomegaly. 5. Aortic atherosclerosis. Aortic Atherosclerosis (ICD10-I70.0). Electronically Signed   By: Greig Pique M.D.   On: 08/21/2023 22:32   DG Chest 1 View Result Date: 08/21/2023 CLINICAL DATA:  10031 Cough 10031 EXAM: CHEST  1 VIEW COMPARISON:  May 25, 2023 FINDINGS: Diffuse bilateral perihilar interstitial opacities. Patchy alveolar opacities in the right mid and lower lung zones. No pleural effusion or pneumothorax. Right hemodialysis catheter terminates in the high right atrium. Mild cardiomegaly.Choose 3no acute fracture or destructive lesion. IMPRESSION: Bilateral perihilar interstitial opacities, which may represent atypical/viral infection or interstitial edema. Patchy alveolar opacities in the right mid and lower lung zones, worrisome for a superimposed bronchopneumonia. Electronically Signed   By: Rogelia Myers M.D.   On: 08/21/2023 20:30    Scheduled Meds:  aspirin   81 mg Oral Daily   budesonide -glycopyrrolate -formoterol   2 puff Inhalation BID   Chlorhexidine  Gluconate Cloth  6 each Topical Q0600   dextrose   12.5 g Intravenous STAT   donepezil   10 mg Oral QHS   ferrous sulfate   325 mg Oral Q breakfast   guaiFENesin   600 mg Oral BID   heparin   5,000 Units Subcutaneous Q8H   hydrALAZINE   100 mg Oral BID   [START ON 08/23/2023] insulin  aspart  0-9 Units Subcutaneous TID WC   ipratropium-albuterol   3 mL Nebulization Q6H   methylPREDNISolone  (SOLU-MEDROL ) injection  80 mg Intravenous Once   Followed by   [START ON 08/23/2023] methylPREDNISolone  (SOLU-MEDROL ) injection  40 mg Intravenous Q12H   Followed by   NOREEN ON 08/24/2023] predniSONE   40 mg Oral Q breakfast   pantoprazole   40 mg Oral Daily   Continuous Infusions:  [START ON 08/23/2023] azithromycin      [START ON 08/23/2023] cefTRIAXone  (ROCEPHIN )  IV     PRN Meds: acetaminophen  **OR** acetaminophen , guaiFENesin -dextromethorphan , ondansetron  **OR** ondansetron  (ZOFRAN )  IV  Time spent: 55 minutes  Author: ELVAN SOR. MD [facility] 08/22/2023 5:46 PM  To reach On-call, see care teams to locate the attending and reach out to them via [email or web address]. If 7PM-7AM, please contact night-coverage If you still have difficulty reaching the attending provider, please page the [facility] (Director on Call) for [facility] on amion for assistance.

## 2023-08-22 NOTE — Progress Notes (Signed)
 Pt receives outpt HD at [facility] on TTS. Navigator following to assist with any HD needs.  Suzen Satchel Dialysis Navigator [phone]

## 2023-08-23 ENCOUNTER — Inpatient Hospital Stay

## 2023-08-23 DIAGNOSIS — J189 Pneumonia, unspecified organism: Secondary | ICD-10-CM | POA: Diagnosis not present

## 2023-08-23 DIAGNOSIS — A419 Sepsis, unspecified organism: Secondary | ICD-10-CM | POA: Diagnosis not present

## 2023-08-23 LAB — BASIC METABOLIC PANEL WITH GFR
Anion gap: 12 (ref 5–15)
BUN: 26 mg/dL — ABNORMAL HIGH (ref 8–23)
CO2: 25 mmol/L (ref 22–32)
Calcium: 8.4 mg/dL — ABNORMAL LOW (ref 8.9–10.3)
Chloride: 102 mmol/L (ref 98–111)
Creatinine, Ser: 3.77 mg/dL — ABNORMAL HIGH (ref 0.44–1.00)
GFR, Estimated: 11 mL/min — ABNORMAL LOW (ref >60)
Glucose, Bld: 153 mg/dL — ABNORMAL HIGH (ref 70–99)
Potassium: 3.6 mmol/L (ref 3.5–5.1)
Sodium: 139 mmol/L (ref 135–145)

## 2023-08-23 LAB — CBC
HCT: 25.7 % — ABNORMAL LOW (ref 36.0–46.0)
Hemoglobin: 7.8 g/dL — ABNORMAL LOW (ref 12.0–15.0)
MCH: 27 pg (ref 26.0–34.0)
MCHC: 30.4 g/dL (ref 30.0–36.0)
MCV: 88.9 fL (ref 80.0–100.0)
Platelets: 189 K/uL (ref 150–400)
RBC: 2.89 MIL/uL — ABNORMAL LOW (ref 3.87–5.11)
RDW: 17.9 % — ABNORMAL HIGH (ref 11.5–15.5)
WBC: 5.9 K/uL (ref 4.0–10.5)
nRBC: 0 % (ref 0.0–0.2)

## 2023-08-23 LAB — GLUCOSE, CAPILLARY
Glucose-Capillary: 166 mg/dL — ABNORMAL HIGH (ref 70–99)
Glucose-Capillary: 200 mg/dL — ABNORMAL HIGH (ref 70–99)
Glucose-Capillary: 227 mg/dL — ABNORMAL HIGH (ref 70–99)
Glucose-Capillary: 188 mg/dL — ABNORMAL HIGH (ref 70–99)

## 2023-08-23 LAB — PHOSPHORUS: Phosphorus: 4.6 mg/dL (ref 2.5–4.6)

## 2023-08-23 LAB — EXPECTORATED SPUTUM ASSESSMENT W GRAM STAIN, RFLX TO RESP C

## 2023-08-23 LAB — MAGNESIUM: Magnesium: 2.2 mg/dL (ref 1.7–2.4)

## 2023-08-23 LAB — MRSA NEXT GEN BY PCR, NASAL: MRSA by PCR Next Gen: NOT DETECTED

## 2023-08-23 MED ORDER — VITAMIN D (ERGOCALCIFEROL) 1.25 MG (50000 UNIT) PO CAPS
50000.0000 [IU] | ORAL_CAPSULE | ORAL | Status: DC
  Administered 2023-08-23: 50000 [IU] via ORAL
  Filled 2023-08-23: qty 1

## 2023-08-23 MED ORDER — HYDROMORPHONE HCL 2 MG PO TABS
1.0000 mg | ORAL_TABLET | Freq: Four times a day (QID) | ORAL | Status: DC | PRN
  Administered 2023-08-23: 1 mg via ORAL
  Filled 2023-08-23: qty 1

## 2023-08-23 MED ORDER — IPRATROPIUM-ALBUTEROL 0.5-2.5 (3) MG/3ML IN SOLN
3.0000 mL | Freq: Three times a day (TID) | RESPIRATORY_TRACT | Status: DC
  Administered 2023-08-23 – 2023-08-24 (×3): 3 mL via RESPIRATORY_TRACT
  Filled 2023-08-23 (×3): qty 3

## 2023-08-23 MED ORDER — AZITHROMYCIN 250 MG PO TABS
500.0000 mg | ORAL_TABLET | Freq: Every day | ORAL | Status: AC
  Administered 2023-08-23 – 2023-08-25 (×3): 500 mg via ORAL
  Filled 2023-08-23 (×3): qty 2

## 2023-08-23 NOTE — Care Management Important Message (Signed)
 Important Message  Patient Details  Name: Jennifer Carrillo MRN: [id number] Date of Birth: 1933/09/17   Important Message Given:  Yes - Medicare IM     Jennifer Carrillo 08/23/2023, 2:31 PM

## 2023-08-23 NOTE — Plan of Care (Signed)
  Problem: Clinical Measurements: Goal: Signs and symptoms of infection will decrease Outcome: Progressing   Problem: Respiratory: Goal: Ability to maintain adequate ventilation will improve Outcome: Progressing   Problem: Education: Goal: Knowledge of General Education information will improve Description: Including pain rating scale, medication(s)/side effects and non-pharmacologic comfort measures Outcome: Progressing   Problem: Clinical Measurements: Goal: Respiratory complications will improve Outcome: Progressing   Problem: Nutrition: Goal: Adequate nutrition will be maintained Outcome: Progressing   Problem: Activity: Goal: Risk for activity intolerance will decrease Outcome: Progressing   Problem: Coping: Goal: Level of anxiety will decrease Outcome: Progressing

## 2023-08-23 NOTE — Plan of Care (Signed)
  Problem: Fluid Volume: Goal: Hemodynamic stability will improve Outcome: Progressing   Problem: Clinical Measurements: Goal: Diagnostic test results will improve Outcome: Progressing Goal: Signs and symptoms of infection will decrease Outcome: Progressing   Problem: Respiratory: Goal: Ability to maintain adequate ventilation will improve Outcome: Progressing   Problem: Education: Goal: Knowledge of General Education information will improve Description: Including pain rating scale, medication(s)/side effects and non-pharmacologic comfort measures Outcome: Progressing   Problem: Health Behavior/Discharge Planning: Goal: Ability to manage health-related needs will improve Outcome: Progressing   Problem: Clinical Measurements: Goal: Ability to maintain clinical measurements within normal limits will improve Outcome: Progressing Goal: Will remain free from infection Outcome: Progressing Goal: Diagnostic test results will improve Outcome: Progressing   Problem: Activity: Goal: Risk for activity intolerance will decrease Outcome: Progressing   Problem: Nutrition: Goal: Adequate nutrition will be maintained Outcome: Progressing

## 2023-08-23 NOTE — Progress Notes (Signed)
 [facility] Progress Note  Patient: Jennifer Carrillo    FMW:[id number]  DOA: 08/21/2023     Date of Service: the patient was seen and examined on 08/23/2023  Chief Complaint  Patient presents with   Altered Mental Status   Brief hospital course: IVIS NICOLSON is a 89 y.o. female with medical history significant for HTN, ESRD on HD TTS, CAD, PVD/carotid artery disease s/p stenting, COPD on home O2 2 L, dementia, upper GI bleed from gastric AVM (05/2023), being admitted with sepsis secondary to multifocal pneumonia after presenting with 2-day history of cough and congestion and now weakness and confusion.  She has had no chest pain, fever or chills. In the ED, Tmax 99.1 and tachycardic to the 130s and 140s, tachypneic to 28, O2 sats near 100% on 2 L Labs notable for WBC 13,000 with lactic acid 1.2 and negative respiratory viral panel. Troponin 35 and BNP 2260 Hemoglobin at baseline at 9.3 Potassium 3.3.  Renal function reflecting dialysis status EKG showed sinus tachycardia with RBBB, T wave inversion in lateral leads. CTA chest negative for PE with findings concerning for multifocal patchy airspace disease worrisome for multifocal pneumonia. Patient started on Rocephin  and azithromycin .  Initiated on sepsis fluids Admission requested    Assessment and Plan:  # Sepsis due to pneumonia  Sepsis criteria include fever, tachycardia and tachypnea with leukocytosis, altered mental status  History of affected contacts per respiratory viral panel negative Rocephin  and azithromycin  Received an initial fluid bolus, will rebolus as needed in view of dialysis status Antitussives, DuoNebs as needed SLP consult and aspiration precaution 8/14 Solu-Medrol  IV 80 mg x 1 dose followed by 40 mg IV twice daily followed by prednisone  40 mg p.o. daily for 3 days Continue Breztri  inhaler Continue spirometry, flutter valve Follow sputum culture and blood culture   Dementia (HCC) Mild delirium/acute  toxic metabolic encephalopathy Patient presented with confusion in the setting of acute infection Neurologic checks, fall and aspiration precautions Continue Aricept    COPD exacerbation Chronic respiratory failure with hypoxia Acute exacerbation not suspected As needed DuoNebs Supplemental oxygen Started steroids as above   ESRD on hemodialysis [facility]) Nephrology consult for continuation of dialysis No missed or incomplete dialysis sessions HD TTS schedule   Coronary artery disease involving native coronary artery of native heart Elevated troponin of 35, suspect demand No complaints of chest pain, EKG nonacute Continue aspirin , atorvastatin  and Imdur    PAD (HCC) History of stenting of carotid artery, femoral artery and renal artery No acute issues Continue aspirin  and statin   AVM (arteriovenous malformation) of duodenum, acquired History of upper GI bleed 05/2023 (AVM) No acute issues suspected Continue Protonix , ferrous sulfate    # Anemia of chronic kidney failure, stage 5 (HCC) Iron  deficiency, TSAT 10%, folic acid  within normal range. Continue oral iron  supplement Monitor H&H    # Vitamin D  insufficiency: started vitamin D  50,000 units p.o. weekly, follow with PCP to repeat vitamin D  level after 3 to 6 months.   Body mass index is 24.76 kg/m.  Interventions:   Diet: Heart healthy/carb modified DVT Prophylaxis: Subcutaneous Heparin     Advance goals of care discussion: Full code  Family Communication: family was not present at bedside, at the time of interview.  The pt provided permission to discuss medical plan with the family. Opportunity was given to ask question and all questions were answered satisfactorily.   Disposition:  Pt is from home, admitted with pneumonia, respiratory failure, still has shortness of breath and on  IV antibiotics, which precludes a safe discharge. Discharge to home, when stable, may need few days to improve.  Subjective: No  significant events overnight, patient still has productive cough, feels little bit improvement, denied any worsening of symptoms.  No chest pain.   Physical Exam: General: NAD, lying comfortably Appear in no distress, affect appropriate Eyes: PERRLA ENT: Oral Mucosa Clear, moist  Neck: no JVD,  Cardiovascular: S1 and S2 Present, no Murmur,  Respiratory: Equal air entry bilaterally, bilateral crackles and wheezes, slightly better than yesterday Abdomen: Bowel Sound present, Soft and no tenderness,  Skin: no rashes Extremities: no Pedal edema, no calf tenderness Neurologic: without any new focal findings Gait not checked due to patient safety concerns  Vitals:   08/22/23 2029 08/23/23 0424 08/23/23 0754 08/23/23 0834  BP:  (!) 150/44  (!) 145/44  Pulse:  61  62  Resp:  18  18  Temp:  97.7 F (36.5 C)  97.9 F (36.6 C)  TempSrc:  Oral    SpO2: 94% 99% 98% 98%  Weight:      Height:        Intake/Output Summary (Last 24 hours) at 08/23/2023 1322 Last data filed at 08/23/2023 0900 Gross per 24 hour  Intake 596.44 ml  Output 700 ml  Net -103.56 ml   Filed Weights   08/21/23 1956 08/22/23 1733  Weight: 81.6 kg 71.7 kg    Data Reviewed: I have personally reviewed and interpreted daily labs, tele strips, imagings as discussed above. I reviewed all nursing notes, pharmacy notes, vitals, pertinent old records I have discussed plan of care as described above with RN and patient/family.  CBC: Recent Labs  Lab 08/16/23 1421 08/21/23 2009 08/22/23 1245 08/23/23 0404  WBC  --  13.0* 9.0 5.9  NEUTROABS  --  9.4*  --   --   HGB 8.1* 9.3* 7.8* 7.8*  HCT 26.6* 30.5* 26.8* 25.7*  MCV  --  90.5 91.8 88.9  PLT  --  226 201 189   Basic Metabolic Panel: Recent Labs  Lab 08/21/23 2009 08/22/23 1245 08/23/23 0404  NA 141 141 139  K 3.3* 3.2* 3.6  CL 103 101 102  CO2 23 23 25   GLUCOSE 118* 69* 153*  BUN 41* 46* 26*  CREATININE 6.12* 6.60* 3.77*  CALCIUM  8.5* 8.1* 8.4*  MG   --  2.2 2.2  PHOS  --  4.8*  4.9* 4.6    Studies: No results found.   Scheduled Meds:  aspirin   81 mg Oral Daily   budesonide -glycopyrrolate -formoterol   2 puff Inhalation BID   Chlorhexidine  Gluconate Cloth  6 each Topical Q0600   dextrose   12.5 g Intravenous STAT   donepezil   10 mg Oral QHS   ferrous sulfate   325 mg Oral Q breakfast   guaiFENesin   600 mg Oral BID   heparin   5,000 Units Subcutaneous Q8H   hydrALAZINE   100 mg Oral BID   insulin  aspart  0-9 Units Subcutaneous TID WC   ipratropium-albuterol   3 mL Nebulization Q6H WA   methylPREDNISolone  (SOLU-MEDROL ) injection  40 mg Intravenous Q12H   Followed by   NOREEN ON 08/24/2023] predniSONE   40 mg Oral Q breakfast   pantoprazole   40 mg Oral Daily   Vitamin D  (Ergocalciferol )  50,000 Units Oral Q7 days   Continuous Infusions:  azithromycin  250 mL/hr at 08/23/23 0143   cefTRIAXone  (ROCEPHIN )  IV 2 g (08/23/23 0015)   PRN Meds: acetaminophen  **OR** acetaminophen , guaiFENesin -dextromethorphan , ondansetron  **OR** ondansetron  (ZOFRAN )  IV  Time spent: 40 minutes  Author: ELVAN SOR. MD [facility] 08/23/2023 1:22 PM  To reach On-call, see care teams to locate the attending and reach out to them via [email or web address]. If 7PM-7AM, please contact night-coverage If you still have difficulty reaching the attending provider, please page the [facility] (Director on Call) for [facility] on amion for assistance.

## 2023-08-23 NOTE — Progress Notes (Signed)
 [facility]  ROUNDING NOTE   Subjective:   Jennifer Carrillo 89 y.o. female with medical history significant of ESRD on HD TTS, HTN, PVD s/p multiple stenting procedures on Plavix , multivessel CAD with known RCA occlusion with collaterals, IDDM, chronic iron  deficiency anemia no longer on iron  supplement, COPD with chronic hypoxic respiratory failure on 2 L nightly, dementia, sent from dialysis center for evaluation of Sepsis due to pneumonia (HCC) [J18.9, A41.9] CAP (community acquired pneumonia) [J18.9]  Patient receives outpatient dialysis treatments at [facility] on a TTS schedule, supervised by [facility] physicians.    Patient seen laying in bed Alert and oriented Ill appearing Complaining of left foot pain, burning sensation  Objective:  Vital signs in last 24 hours:  Temp:  [97.6 F (36.4 C)-99.1 F (37.3 C)] 97.9 F (36.6 C) (08/15 0834) Pulse Rate:  [61-72] 62 (08/15 0834) Resp:  [17-35] 18 (08/15 0834) BP: (121-166)/(40-95) 145/44 (08/15 0834) SpO2:  [94 %-100 %] 98 % (08/15 0834) Weight:  [71.7 kg] 71.7 kg (08/14 1733)  Weight change: -9.947 kg Filed Weights   08/21/23 1956 08/22/23 1733  Weight: 81.6 kg 71.7 kg    Intake/Output: I/O last 3 completed shifts: In: 1076.4 [P.O.:120; IV Piggyback:956.4] Out: 700 [Other:700]   Intake/Output this shift:  Total I/O In: 120 [P.O.:120] Out: -   Physical Exam: General: NAD, ill appearing  Head: Normocephalic, atraumatic. Moist oral mucosal membranes  Eyes: Anicteric  Neck: Supple  Lungs:  [place], [place] O2  Heart: Regular rate and rhythm  Abdomen:  Soft, nontender  Extremities:  No peripheral edema.  Neurologic: Awake, alert, conversant  Skin: Warm,dry, no rash  Access: Rt permcath    Basic Metabolic Panel: Recent Labs  Lab 08/21/23 2009 08/22/23 1245 08/23/23 0404  NA 141 141 139  K 3.3* 3.2* 3.6  CL 103 101 102  CO2 23 23 25   GLUCOSE 118* 69* 153*  BUN 41* 46* 26*  CREATININE 6.12* 6.60*  3.77*  CALCIUM  8.5* 8.1* 8.4*  MG  --  2.2 2.2  PHOS  --  4.8*  4.9* 4.6    Liver Function Tests: Recent Labs  Lab 08/22/23 1245  ALBUMIN 2.6*   No results for input(s): LIPASE, AMYLASE in the last 168 hours. No results for input(s): AMMONIA in the last 168 hours.  CBC: Recent Labs  Lab 08/16/23 1421 08/21/23 2009 08/22/23 1245 08/23/23 0404  WBC  --  13.0* 9.0 5.9  NEUTROABS  --  9.4*  --   --   HGB 8.1* 9.3* 7.8* 7.8*  HCT 26.6* 30.5* 26.8* 25.7*  MCV  --  90.5 91.8 88.9  PLT  --  226 201 189    Cardiac Enzymes: No results for input(s): CKTOTAL, CKMB, CKMBINDEX, TROPONINI in the last 168 hours.  BNP: Invalid input(s): POCBNP  CBG: Recent Labs  Lab 08/22/23 1805 08/22/23 2102 08/23/23 0837 08/23/23 1222  GLUCAP 117* 94 166* 200*    Microbiology: Results for orders placed or performed during the hospital encounter of 08/21/23  Resp panel by RT-PCR (RSV, Flu A&B, Covid) Anterior Nasal Swab     Status: None   Collection Time: 08/21/23  8:09 PM   Specimen: Anterior Nasal Swab  Result Value Ref Range Status   SARS Coronavirus 2 by RT PCR NEGATIVE NEGATIVE Final    Comment: (NOTE) SARS-CoV-2 target nucleic acids are NOT DETECTED.  The SARS-CoV-2 RNA is generally detectable in upper respiratory specimens during the acute phase of infection. The lowest concentration  of SARS-CoV-2 viral copies this assay can detect is 138 copies/mL. A negative result does not preclude SARS-Cov-2 infection and should not be used as the sole basis for treatment or other patient management decisions. A negative result may occur with  improper specimen collection/handling, submission of specimen other than nasopharyngeal swab, presence of viral mutation(s) within the areas targeted by this assay, and inadequate number of viral copies(<138 copies/mL). A negative result must be combined with clinical observations, patient history, and epidemiological information.  The expected result is Negative.  Fact Sheet for Patients:  BloggerCourse.com  Fact Sheet for Healthcare Providers:  SeriousBroker.it  This test is no t yet approved or cleared by the [place]  FDA and  has been authorized for detection and/or diagnosis of SARS-CoV-2 by FDA under an Emergency Use Authorization (EUA). This EUA will remain  in effect (meaning this test can be used) for the duration of the COVID-19 declaration under Section 564(b)(1) of the Act, 21 U.S.C.section 360bbb-3(b)(1), unless the authorization is terminated  or revoked sooner.       Influenza A by PCR NEGATIVE NEGATIVE Final   Influenza B by PCR NEGATIVE NEGATIVE Final    Comment: (NOTE) The Xpert Xpress SARS-CoV-2/FLU/RSV plus assay is intended as an aid in the diagnosis of influenza from Nasopharyngeal swab specimens and should not be used as a sole basis for treatment. Nasal washings and aspirates are unacceptable for Xpert Xpress SARS-CoV-2/FLU/RSV testing.  Fact Sheet for Patients: BloggerCourse.com  Fact Sheet for Healthcare Providers: SeriousBroker.it  This test is not yet approved or cleared by the [place]  FDA and has been authorized for detection and/or diagnosis of SARS-CoV-2 by FDA under an Emergency Use Authorization (EUA). This EUA will remain in effect (meaning this test can be used) for the duration of the COVID-19 declaration under Section 564(b)(1) of the Act, 21 U.S.C. section 360bbb-3(b)(1), unless the authorization is terminated or revoked.     Resp Syncytial Virus by PCR NEGATIVE NEGATIVE Final    Comment: (NOTE) Fact Sheet for Patients: BloggerCourse.com  Fact Sheet for Healthcare Providers: SeriousBroker.it  This test is not yet approved or cleared by the [place]  FDA and has been authorized for detection and/or  diagnosis of SARS-CoV-2 by FDA under an Emergency Use Authorization (EUA). This EUA will remain in effect (meaning this test can be used) for the duration of the COVID-19 declaration under Section 564(b)(1) of the Act, 21 U.S.C. section 360bbb-3(b)(1), unless the authorization is terminated or revoked.  Performed at [facility], [address] [place], [place] [address]   MRSA Next Gen by PCR, Nasal     Status: None   Collection Time: 08/23/23  7:55 AM   Specimen: Nasal Mucosa; Nasal Swab  Result Value Ref Range Status   MRSA by PCR Next Gen NOT DETECTED NOT DETECTED Final    Comment: (NOTE) The GeneXpert MRSA Assay (FDA approved for NASAL specimens only), is one component of a comprehensive MRSA colonization surveillance program. It is not intended to diagnose MRSA infection nor to guide or monitor treatment for MRSA infections. Test performance is not FDA approved in patients less than 29 years old. Performed at [facility], [address] [place], [place] [address]   Expectorated Sputum Assessment w Gram Stain, Rflx to Resp Cult     Status: None   Collection Time: 08/23/23  7:56 AM   Specimen: Sputum  Result Value Ref Range Status   Specimen Description SPUTUM  Final   Special Requests NONE  Final   Sputum evaluation   Final    THIS SPECIMEN IS ACCEPTABLE FOR SPUTUM CULTURE Performed at [facility], [address] [place], [place] [address]    Report Status 08/23/2023 FINAL  Final    Coagulation Studies: Recent Labs    08/22/23 0455  LABPROT 15.5*  INR 1.2    Urinalysis: No results for input(s): COLORURINE, LABSPEC, PHURINE, GLUCOSEU, HGBUR, BILIRUBINUR, KETONESUR, PROTEINUR, UROBILINOGEN, NITRITE, LEUKOCYTESUR in the last 72 hours.  Invalid input(s): APPERANCEUR    Imaging: CT Angio Chest PE W/Cm &/Or Wo Cm Result Date: 08/21/2023 CLINICAL DATA:  High probability for PE. Upper respiratory infection.  EXAM: CT ANGIOGRAPHY CHEST WITH CONTRAST TECHNIQUE: Multidetector CT imaging of the chest was performed using the standard protocol during bolus administration of intravenous contrast. Multiplanar CT image reconstructions and MIPs were obtained to evaluate the vascular anatomy. RADIATION DOSE REDUCTION: This exam was performed according to the departmental dose-optimization program which includes automated exposure control, adjustment of the mA and/or kV according to patient size and/or use of iterative reconstruction technique. CONTRAST:  75mL OMNIPAQUE  IOHEXOL  350 MG/ML SOLN COMPARISON:  CT of the chest 10/28/2021. FINDINGS: Cardiovascular: Heart is enlarged. Aorta is normal in size. There is no pericardial effusion. There are atherosclerotic calcifications of the aorta and coronary arteries. There is adequate opacification of the pulmonary arteries to the segmental level. There is no pulmonary embolism identified. Right-sided central venous catheter tip ends in the superior portion of the right atrium. Mediastinum/Nodes: There are prominent, but nonenlarged AP window lymph nodes, paratracheal lymph nodes and right hilar lymph nodes. Visualized esophagus and thyroid  gland are within normal limits. Lungs/Pleura: Multifocal patchy airspace, ground-glass and tree-in-bud opacities are seen throughout both lungs sparing the lung apices. There is no pleural effusion or pneumothorax. There is central peribronchial wall and bilateral lower lobe peribronchial wall thickening. Upper Abdomen: No acute abnormality. There is thickening of the bilateral adrenal glands nonspecific and unchanged. Musculoskeletal: No chest wall abnormality. No acute or significant osseous findings. Review of the MIP images confirms the above findings. IMPRESSION: 1. No evidence for pulmonary embolism. 2. Multifocal patchy airspace, ground-glass and tree-in-bud opacities throughout both lungs worrisome for multifocal pneumonia. 3. Central  peribronchial wall thickening compatible with bronchitis. 4. Cardiomegaly. 5. Aortic atherosclerosis. Aortic Atherosclerosis (ICD10-I70.0). Electronically Signed   By: Greig Pique M.D.   On: 08/21/2023 22:32   DG Chest 1 View Result Date: 08/21/2023 CLINICAL DATA:  10031 Cough 10031 EXAM: CHEST  1 VIEW COMPARISON:  May 25, 2023 FINDINGS: Diffuse bilateral perihilar interstitial opacities. Patchy alveolar opacities in the right mid and lower lung zones. No pleural effusion or pneumothorax. Right hemodialysis catheter terminates in the high right atrium. Mild cardiomegaly.Choose 3no acute fracture or destructive lesion. IMPRESSION: Bilateral perihilar interstitial opacities, which may represent atypical/viral infection or interstitial edema. Patchy alveolar opacities in the right mid and lower lung zones, worrisome for a superimposed bronchopneumonia. Electronically Signed   By: Rogelia Myers M.D.   On: 08/21/2023 20:30     Medications:    azithromycin  250 mL/hr at 08/23/23 0143   cefTRIAXone  (ROCEPHIN )  IV 2 g (08/23/23 0015)    aspirin   81 mg Oral Daily   budesonide -glycopyrrolate -formoterol   2 puff Inhalation BID   Chlorhexidine  Gluconate Cloth  6 each Topical Q0600   dextrose   12.5 g Intravenous STAT   donepezil   10 mg Oral QHS   ferrous sulfate   325 mg Oral Q breakfast   guaiFENesin   600 mg Oral BID  heparin   5,000 Units Subcutaneous Q8H   hydrALAZINE   100 mg Oral BID   insulin  aspart  0-9 Units Subcutaneous TID WC   ipratropium-albuterol   3 mL Nebulization Q6H WA   methylPREDNISolone  (SOLU-MEDROL ) injection  40 mg Intravenous Q12H   Followed by   NOREEN ON 08/24/2023] predniSONE   40 mg Oral Q breakfast   pantoprazole   40 mg Oral Daily   Vitamin D  (Ergocalciferol )  50,000 Units Oral Q7 days   acetaminophen  **OR** acetaminophen , guaiFENesin -dextromethorphan , ondansetron  **OR** ondansetron  (ZOFRAN ) IV  Assessment/ Plan:  Ms. Jennifer Carrillo is a 89 y.o.  female medical history  significant of ESRD on HD TTS, HTN, PVD s/p multiple stenting procedures on Plavix , multivessel CAD with known RCA occlusion with collaterals, IDDM, chronic iron  deficiency anemia no longer on iron  supplement, COPD with chronic hypoxic respiratory failure on 2 L nightly, dementia, sent from dialysis center for evaluation of Sepsis due to pneumonia (HCC) [J18.9, A41.9] CAP (community acquired pneumonia) [J18.9]  [facility] [place]/TTS/Rt permcath  Sepsis likely secondary to pneumonia. CT angio chest concerning for multifocal pneumonia Continue Rocephin  and azithromycin  per primary team  2. End stage renal disease on hemodialysis. Last treatment received on Tuesday. Dialysis received yesterday, UF . Reduced due to cramping. Next treatment scheduled for Saturday.   3. Anemia of chronic kidney disease Lab Results  Component Value Date   HGB 7.8 (L) 08/23/2023    Hgb 7.8, patient receives mircera at outpatient clinic. Will continue to monitor  4. Secondary Hyperparathyroidism: with outpatient labs:unavailble Lab Results  Component Value Date   PTH 35 06/27/2018   CALCIUM  8.4 (L) 08/23/2023   PHOS 4.6 08/23/2023    Calcium  and phos within desired range.    LOS: 1 Clio Gerhart 8/15/202512:28 PM

## 2023-08-23 NOTE — Consult Note (Signed)
 PHARMACIST - PHYSICIAN COMMUNICATION CONCERNING: Antibiotic IV to Oral Route Change Policy  RECOMMENDATION: This patient is receiving azithromycin  intravenously. Based on criteria approved by the Pharmacy and Therapeutics Committee, the antibiotic(s) is/are being converted to the equivalent dose of an oral formulation.   DESCRIPTION: These criteria include: Patient being treated for a respiratory tract infection, urinary tract infection, cellulitis or Clostridioides difficile-associated diarrhea if on metronidazole . The patient is not neutropenic and does not exhibit a malabsorptive GI state. The patient is eating (either orally or via tube) and/or has been taking other orally administered medications for at least 24 hours. The patient is improving clinically and has a 24-hour Tmax of <100.5 F.  If you have questions about this conversion, please contact the Pharmacy Department:  [x]   [phone] )  [facility] []   [phone] )  [facility] []   [phone] )  [facility]  []   [phone] )  [facility]   Will M. Lenon, PharmD Clinical Pharmacist 08/23/2023 1:29 PM

## 2023-08-24 DIAGNOSIS — J189 Pneumonia, unspecified organism: Secondary | ICD-10-CM | POA: Diagnosis not present

## 2023-08-24 DIAGNOSIS — A419 Sepsis, unspecified organism: Secondary | ICD-10-CM | POA: Diagnosis not present

## 2023-08-24 LAB — PHOSPHORUS: Phosphorus: 6.5 mg/dL — ABNORMAL HIGH (ref 2.5–4.6)

## 2023-08-24 LAB — CBC
HCT: 23.8 % — ABNORMAL LOW (ref 36.0–46.0)
Hemoglobin: 7.4 g/dL — ABNORMAL LOW (ref 12.0–15.0)
MCH: 27.1 pg (ref 26.0–34.0)
MCHC: 31.1 g/dL (ref 30.0–36.0)
MCV: 87.2 fL (ref 80.0–100.0)
Platelets: 203 K/uL (ref 150–400)
RBC: 2.73 MIL/uL — ABNORMAL LOW (ref 3.87–5.11)
RDW: 18 % — ABNORMAL HIGH (ref 11.5–15.5)
WBC: 10.6 K/uL — ABNORMAL HIGH (ref 4.0–10.5)
nRBC: 0 % (ref 0.0–0.2)

## 2023-08-24 LAB — BASIC METABOLIC PANEL WITH GFR
Anion gap: 11 (ref 5–15)
BUN: 51 mg/dL — ABNORMAL HIGH (ref 8–23)
CO2: 25 mmol/L (ref 22–32)
Calcium: 8.5 mg/dL — ABNORMAL LOW (ref 8.9–10.3)
Chloride: 99 mmol/L (ref 98–111)
Creatinine, Ser: 5.48 mg/dL — ABNORMAL HIGH (ref 0.44–1.00)
GFR, Estimated: 7 mL/min — ABNORMAL LOW (ref >60)
Glucose, Bld: 220 mg/dL — ABNORMAL HIGH (ref 70–99)
Potassium: 3.8 mmol/L (ref 3.5–5.1)
Sodium: 135 mmol/L (ref 135–145)

## 2023-08-24 LAB — GLUCOSE, CAPILLARY
Glucose-Capillary: 198 mg/dL — ABNORMAL HIGH (ref 70–99)
Glucose-Capillary: 168 mg/dL — ABNORMAL HIGH (ref 70–99)
Glucose-Capillary: 171 mg/dL — ABNORMAL HIGH (ref 70–99)
Glucose-Capillary: 218 mg/dL — ABNORMAL HIGH (ref 70–99)

## 2023-08-24 LAB — MAGNESIUM: Magnesium: 2.5 mg/dL — ABNORMAL HIGH (ref 1.7–2.4)

## 2023-08-24 MED ORDER — QUETIAPINE FUMARATE 25 MG PO TABS
50.0000 mg | ORAL_TABLET | Freq: Every evening | ORAL | Status: DC
  Administered 2023-08-24 – 2023-08-26 (×3): 50 mg via ORAL
  Filled 2023-08-24 (×3): qty 2

## 2023-08-24 MED ORDER — IPRATROPIUM-ALBUTEROL 0.5-2.5 (3) MG/3ML IN SOLN: 3.0000 mL | Freq: Four times a day (QID) | RESPIRATORY_TRACT | Status: DC | PRN

## 2023-08-24 MED ORDER — HEPARIN SODIUM (PORCINE) 1000 UNIT/ML IJ SOLN
INTRAMUSCULAR | Status: AC
Start: 2023-08-24 — End: 2023-08-24
  Filled 2023-08-24: qty 4

## 2023-08-24 NOTE — Discharge Instructions (Signed)
 Contact Information [facility] [address] [place], [place] [address] (Directions)  Enrollment Information: [phone] Office: [phone] FAX: [phone] [place] [facility]: [phone]

## 2023-08-24 NOTE — Plan of Care (Signed)
   Problem: Fluid Volume: Goal: Hemodynamic stability will improve Outcome: Progressing   Problem: Clinical Measurements: Goal: Diagnostic test results will improve Outcome: Progressing Goal: Signs and symptoms of infection will decrease Outcome: Progressing   Problem: Respiratory: Goal: Ability to maintain adequate ventilation will improve Outcome: Progressing

## 2023-08-24 NOTE — Evaluation (Signed)
 Occupational Therapy Evaluation Patient Details Name: Jennifer Carrillo MRN: [id number] DOB: [birth year] Today's Date: 08/24/2023   History of Present Illness   CALLIA SWIM is a 89 y.o. female with medical history significant for HTN, ESRD on HD TTS, CAD, PVD/carotid artery disease s/p stenting, COPD on home O2 2 L, dementia, upper GI bleed from gastric AVM (05/2023), being admitted with sepsis secondary to multifocal pneumonia after presenting with 2-day history of cough and congestion and now weakness and confusion.     Clinical Impressions Jennifer Carrillo was seen for OT evaluation this date. Prior to hospital admission, pt required at least minimal assistance from family members for ADL management including LB bathing and dressing. Family also assist her with IADL management including medication management, cooking, and cleaning tasks. Pt lives with her spouse in a 1 level home with 1 STE. Pt presents with deficits in strength, safety awareness, activity tolerance, and knowledge of AE/DME for ADL management affecting safe and optimal ADL completion. Pt currently requires MIN A for bed/functional mobility, MOD A for LB ADL management from STS, and SET UP assist for UB ADL management.  Pt would benefit from skilled OT services to address noted impairments and functional limitations (see below for any additional details) in order to maximize safety and independence while minimizing falls risk and caregiver burden. Anticipate the need for follow up OT services in the home setting upon acute hospital DC.      If plan is discharge home, recommend the following:   A little help with walking and/or transfers;A lot of help with bathing/dressing/bathroom;Assistance with cooking/housework;Assist for transportation;Help with stairs or ramp for entrance;Supervision due to cognitive status;Direct supervision/assist for medications management     Functional Status Assessment   Patient has  had a recent decline in their functional status and demonstrates the ability to make significant improvements in function in a reasonable and predictable amount of time.     Equipment Recommendations   BSC/3in1     Recommendations for Other Services         Precautions/Restrictions   Precautions Precautions: Fall Recall of Precautions/Restrictions: Impaired Restrictions Weight Bearing Restrictions Per Provider Order: No     Mobility Bed Mobility Overal bed mobility: Needs Assistance Bed Mobility: Supine to Sit, Sit to Supine     Supine to sit: Min assist Sit to supine: Min assist   General bed mobility comments: MIN A for trunk elevation during sup>sit and to bring BLE over EOB with sit>sup.    Transfers Overall transfer level: Needs assistance Equipment used: Rolling walker (2 wheels) Transfers: Sit to/from Stand, Bed to chair/wheelchair/BSC Sit to Stand: Min assist     Step pivot transfers: Contact guard assist            Balance Overall balance assessment: Needs assistance Sitting-balance support: No upper extremity supported, Feet supported Sitting balance-Leahy Scale: Good     Standing balance support: Bilateral upper extremity supported, During functional activity, Reliant on assistive device for balance Standing balance-Leahy Scale: Fair                             ADL either performed or assessed with clinical judgement   ADL Overall ADL's : Needs assistance/impaired  General ADL Comments: MIN A for bed mobility and STS t/fs from EOB and BSC. Pt is able to perform peri-care after toileting, and seated UB grooming with SET UP assist. Anticipate MIN-MOD A for LB dressing and bathing tasks.     Vision Baseline Vision/History: 1 Wears glasses Ability to See in Adequate Light: 1 Impaired Patient Visual Report: No change from baseline       Perception         Praxis          Pertinent Vitals/Pain Pain Assessment Pain Assessment: No/denies pain     Extremity/Trunk Assessment Upper Extremity Assessment Upper Extremity Assessment: Generalized weakness   Lower Extremity Assessment Lower Extremity Assessment: Generalized weakness   Cervical / Trunk Assessment Cervical / Trunk Assessment: Normal   Communication Communication Communication: Impaired Factors Affecting Communication: Hearing impaired   Cognition Arousal: Alert Behavior During Therapy: WFL for tasks assessed/performed, Impulsive Cognition: Cognition impaired   Orientation impairments: Time, Situation Awareness: Online awareness impaired, Intellectual awareness intact Memory impairment (select all impairments): Working Civil Service fast streamer, Copywriter, advertising, Engineer, structural memory Attention impairment (select first level of impairment): Sustained attention, Selective attention Executive functioning impairment (select all impairments): Reasoning, Problem solving                   Following commands: Impaired Following commands impaired: Follows one step commands with increased time, Follows multi-step commands inconsistently     Cueing  General Comments   Cueing Techniques: Verbal cues;Gestural cues;Visual cues  VSS with pt on RA t/o session. Denies any adverse s/s. Educated on energy conservation strategies and PLB during ADL management.   Exercises Other Exercises Other Exercises: Pt and caregiver educated on role of OT in acute setting, safety, falls prevention strategies, DC recs and energy conservation strategies.   Shoulder Instructions      Home Living Family/patient expects to be discharged to:: Private residence Living Arrangements: Spouse/significant other Available Help at Discharge: Family;Available 24 hours/day Type of Home: House Home Access: Stairs to enter [employer] of Steps: 1 Entrance Stairs-Rails: None Home Layout: One level      Bathroom Shower/Tub: Sponge bathes at baseline         Home Equipment: Agricultural consultant (2 wheels);Rollator (4 wheels);BSC/3in1          Prior Functioning/Environment Prior Level of Function : Needs assist             Mobility Comments: Pt reports regular furniture surfing in the home for mobility. She states she also has a rollator but does not use it consistently. Uses WC for community mobility. ADLs Comments: Per pt/husband pt requires assistance from family members for ADLs/IADLs. She requires assist with sponge baths, dressing, and mobility on a regular basis. Family able to provide 24/7 assist.    OT Problem List: Decreased strength;Decreased coordination;Decreased activity tolerance;Decreased safety awareness;Decreased knowledge of use of DME or AE;Impaired balance (sitting and/or standing);Cardiopulmonary status limiting activity;Decreased cognition   OT Treatment/Interventions: Self-care/ADL training;Therapeutic exercise;Therapeutic activities;DME and/or AE instruction;Patient/family education;Balance training;Energy conservation      OT Goals(Current goals can be found in the care plan section)   Acute Rehab OT Goals Patient Stated Goal: To go home OT Goal Formulation: With patient Time For Goal Achievement: 09/07/23 Potential to Achieve Goals: Good ADL Goals Pt Will Perform Grooming: sitting;standing;with modified independence Pt Will Perform Lower Body Dressing: sit to/from stand;with min assist;with adaptive equipment Pt Will Transfer to Toilet: bedside commode;with supervision;with set-up;ambulating Pt Will Perform Toileting - Clothing Manipulation and  hygiene: with adaptive equipment;sitting/lateral leans;with set-up;with supervision   OT Frequency:  Min 2X/week    Co-evaluation              AM-PAC OT 6 Clicks Daily Activity     Outcome Measure Help from another person eating meals?: A Little Help from another person taking care of personal  grooming?: A Little Help from another person toileting, which includes using toliet, bedpan, or urinal?: A Little Help from another person bathing (including washing, rinsing, drying)?: A Little Help from another person to put on and taking off regular upper body clothing?: A Little Help from another person to put on and taking off regular lower body clothing?: A Little 6 Click Score: 18   End of Session Equipment Utilized During Treatment: Gait belt;Rolling walker (2 wheels)  Activity Tolerance: Patient tolerated treatment well Patient left: in bed;with call bell/phone within reach;with bed alarm set;with family/visitor present  OT Visit Diagnosis: Other abnormalities of gait and mobility (R26.89);Muscle weakness (generalized) (M62.81)                Time: 8694-8675 OT Time Calculation (min): 19 min Charges:  OT General Charges $OT Visit: 1 Visit OT Evaluation $OT Eval Moderate Complexity: 1 Mod OT Treatments $Self Care/Home Management : 8-22 mins  Jhonny Pelton, M.S., OTR/L 08/24/23, 3:18 PM

## 2023-08-24 NOTE — Progress Notes (Signed)
 [facility] Progress Note  Patient: Jennifer Carrillo    FMW:[id number]  DOA: 08/21/2023     Date of Service: the patient was seen and examined on 08/24/2023  Chief Complaint  Patient presents with   Altered Mental Status   Brief hospital course: Jennifer Carrillo is a 89 y.o. female with medical history significant for HTN, ESRD on HD TTS, CAD, PVD/carotid artery disease s/p stenting, COPD on home O2 2 L, dementia, upper GI bleed from gastric AVM (05/2023), being admitted with sepsis secondary to multifocal pneumonia after presenting with 2-day history of cough and congestion and now weakness and confusion.  She has had no chest pain, fever or chills. In the ED, Tmax 99.1 and tachycardic to the 130s and 140s, tachypneic to 28, O2 sats near 100% on 2 L Labs notable for WBC 13,000 with lactic acid 1.2 and negative respiratory viral panel. Troponin 35 and BNP 2260 Hemoglobin at baseline at 9.3 Potassium 3.3.  Renal function reflecting dialysis status EKG showed sinus tachycardia with RBBB, T wave inversion in lateral leads. CTA chest negative for PE with findings concerning for multifocal patchy airspace disease worrisome for multifocal pneumonia. Patient started on Rocephin  and azithromycin .  Initiated on sepsis fluids Admission requested    Assessment and Plan:  # Sepsis due to pneumonia  Sepsis criteria include fever, tachycardia and tachypnea with leukocytosis, altered mental status  History of affected contacts per respiratory viral panel negative Rocephin  and azithromycin  Received an initial fluid bolus, will rebolus as needed in view of dialysis status Antitussives, DuoNebs as needed SLP consult and aspiration precaution 8/14 Solu-Medrol  IV 80 mg x 1 dose followed by 40 mg IV twice daily followed by prednisone  40 mg p.o. daily for 3 days Continue Breztri  inhaler Continue spirometry, flutter valve Follow sputum culture and blood culture   Dementia (HCC) Mild delirium/acute  toxic metabolic encephalopathy Patient presented with confusion in the setting of acute infection Neurologic checks, fall and aspiration precautions Continue Aricept    COPD exacerbation Chronic respiratory failure with hypoxia Acute exacerbation not suspected As needed DuoNebs Supplemental oxygen Started steroids as above   ESRD on hemodialysis [facility]) Nephrology consult for continuation of dialysis No missed or incomplete dialysis sessions HD TTS schedule   Coronary artery disease involving native coronary artery of native heart Elevated troponin of 35, suspect demand No complaints of chest pain, EKG nonacute Continue aspirin , atorvastatin  and Imdur    PAD (HCC) History of stenting of carotid artery, femoral artery and renal artery No acute issues Continue aspirin  and statin   AVM (arteriovenous malformation) of duodenum, acquired History of upper GI bleed 05/2023 (AVM) No acute issues suspected Continue Protonix , ferrous sulfate    # Anemia of chronic kidney failure, stage 5 (HCC) Iron  deficiency, Tsat 10%, folic acid  within normal range. Continue oral iron  supplement 8/16 patient is poor historian, as per her husband she gets blood transfusion as an outpatient. Monitor H&H    # Vitamin D  insufficiency: started vitamin D  50,000 units p.o. weekly, follow with PCP to repeat vitamin D  level after 3 to 6 months.  # Sundowning, started Seroquel  50 mg p.o. every evening    Body mass index is 25.28 kg/m.  Interventions:   Diet: Heart healthy/carb modified DVT Prophylaxis: Subcutaneous Heparin     Advance goals of care discussion: Full code  Family Communication: family was not present at bedside, at the time of interview.  The pt provided permission to discuss medical plan with the family. Opportunity was given to ask  question and all questions were answered satisfactorily.   Disposition:  Pt is from home, admitted with pneumonia, respiratory failure, still has  shortness of breath and on IV antibiotics, which precludes a safe discharge. Discharge to home, when stable, may need few days to improve.  Follow-up PT and OT eval for DC plan, most likely discharge home with home services tomorrow a.m.  Subjective: No significant events overnight, patient is feeling improvement in the productive cough, still has mild to moderate symptoms.  Overall feeling improvement.  Patient has generalized weakness and her husband wants some help at home. PT and OT order placed.  TOC involved.    Physical Exam: General: NAD, lying comfortably Appear in no distress, affect appropriate Eyes: PERRLA ENT: Oral Mucosa Clear, moist  Neck: no JVD,  Cardiovascular: S1 and S2 Present, no Murmur,  Respiratory: Equal air entry bilaterally, bilateral crackles and wheezes, slightly better than yesterday Abdomen: Bowel Sound present, Soft and no tenderness,  Skin: no rashes Extremities: no Pedal edema, no calf tenderness Neurologic: without any new focal findings Gait not checked due to patient safety concerns  Vitals:   08/24/23 0810 08/24/23 1407 08/24/23 1433 08/24/23 1436  BP: (!) 138/46   (!) 135/47  Pulse: 69     Resp: 17     Temp: 97.8 F (36.6 C)   97.8 F (36.6 C)  TempSrc:    Oral  SpO2: 100% 100%    Weight:   73.2 kg   Height:        Intake/Output Summary (Last 24 hours) at 08/24/2023 1450 Last data filed at 08/24/2023 1300 Gross per 24 hour  Intake 360 ml  Output --  Net 360 ml   Filed Weights   08/21/23 1956 08/22/23 1733 08/24/23 1433  Weight: 81.6 kg 71.7 kg 73.2 kg    Data Reviewed: I have personally reviewed and interpreted daily labs, tele strips, imagings as discussed above. I reviewed all nursing notes, pharmacy notes, vitals, pertinent old records I have discussed plan of care as described above with RN and patient/family.  CBC: Recent Labs  Lab 08/21/23 2009 08/22/23 1245 08/23/23 0404 08/24/23 0500  WBC 13.0* 9.0 5.9 10.6*   NEUTROABS 9.4*  --   --   --   HGB 9.3* 7.8* 7.8* 7.4*  HCT 30.5* 26.8* 25.7* 23.8*  MCV 90.5 91.8 88.9 87.2  PLT 226 201 189 203   Basic Metabolic Panel: Recent Labs  Lab 08/21/23 2009 08/22/23 1245 08/23/23 0404 08/24/23 0500  NA 141 141 139 135  K 3.3* 3.2* 3.6 3.8  CL 103 101 102 99  CO2 23 23 25 25   GLUCOSE 118* 69* 153* 220*  BUN 41* 46* 26* 51*  CREATININE 6.12* 6.60* 3.77* 5.48*  CALCIUM  8.5* 8.1* 8.4* 8.5*  MG  --  2.2 2.2 2.5*  PHOS  --  4.8*  4.9* 4.6 6.5*    Studies: No results found.   Scheduled Meds:  aspirin   81 mg Oral Daily   azithromycin   500 mg Oral Daily   budesonide -glycopyrrolate -formoterol   2 puff Inhalation BID   Chlorhexidine  Gluconate Cloth  6 each Topical Q0600   donepezil   10 mg Oral QHS   ferrous sulfate   325 mg Oral Q breakfast   guaiFENesin   600 mg Oral BID   heparin   5,000 Units Subcutaneous Q8H   hydrALAZINE   100 mg Oral BID   insulin  aspart  0-9 Units Subcutaneous TID WC   pantoprazole   40 mg Oral Daily  predniSONE   40 mg Oral Q breakfast   QUEtiapine   50 mg Oral QPM   Vitamin D  (Ergocalciferol )  50,000 Units Oral Q7 days   Continuous Infusions:  cefTRIAXone  (ROCEPHIN )  IV 2 g (08/23/23 2350)   PRN Meds: acetaminophen  **OR** acetaminophen , guaiFENesin -dextromethorphan , HYDROmorphone , ipratropium-albuterol , ondansetron  **OR** ondansetron  (ZOFRAN ) IV  Time spent: 40 minutes  Author: ELVAN SOR. MD [facility] 08/24/2023 2:50 PM  To reach On-call, see care teams to locate the attending and reach out to them via [email or web address]. If 7PM-7AM, please contact night-coverage If you still have difficulty reaching the attending provider, please page the [facility] (Director on Call) for [facility] on amion for assistance.

## 2023-08-24 NOTE — Progress Notes (Signed)
 [facility]  PROGRESS NOTE   Subjective:   Patient seen on dialysis.  Denies any chest pain shortness of breath.  She has been on TTS schedule for dialysis.  Objective:  Vital signs: Blood pressure (!) 147/43, pulse 69, temperature 97.8 F (36.6 C), temperature source Oral, resp. rate (!) 26, height 5' 7 (1.702 m), weight 73.2 kg, SpO2 100%.  Intake/Output Summary (Last 24 hours) at 08/24/2023 1659 Last data filed at 08/24/2023 1300 Gross per 24 hour  Intake 360 ml  Output --  Net 360 ml   Filed Weights   08/21/23 1956 08/22/23 1733 08/24/23 1433  Weight: 81.6 kg 71.7 kg 73.2 kg     Physical Exam: General:  No acute distress  Head:  Normocephalic, atraumatic. Moist oral mucosal membranes  Eyes:  Anicteric  Neck:  Supple  Lungs:   Clear to auscultation, normal effort  Heart:  S1S2 no rubs  Abdomen:   Soft, nontender, bowel sounds present  Extremities:  peripheral edema.  Neurologic:  Awake, alert, following commands  Skin:  No lesions  Access:     Basic Metabolic Panel: Recent Labs  Lab 08/21/23 2009 08/22/23 1245 08/23/23 0404 08/24/23 0500  NA 141 141 139 135  K 3.3* 3.2* 3.6 3.8  CL 103 101 102 99  CO2 23 23 25 25   GLUCOSE 118* 69* 153* 220*  BUN 41* 46* 26* 51*  CREATININE 6.12* 6.60* 3.77* 5.48*  CALCIUM  8.5* 8.1* 8.4* 8.5*  MG  --  2.2 2.2 2.5*  PHOS  --  4.8*  4.9* 4.6 6.5*   GFR: Estimated Creatinine Clearance: 6.9 mL/min (A) (by C-G formula based on SCr of 5.48 mg/dL (H)).  Liver Function Tests: Recent Labs  Lab 08/22/23 1245  ALBUMIN 2.6*   No results for input(s): LIPASE, AMYLASE in the last 168 hours. No results for input(s): AMMONIA in the last 168 hours.  CBC: Recent Labs  Lab 08/21/23 2009 08/22/23 1245 08/23/23 0404 08/24/23 0500  WBC 13.0* 9.0 5.9 10.6*  NEUTROABS 9.4*  --   --   --   HGB 9.3* 7.8* 7.8* 7.4*  HCT 30.5* 26.8* 25.7* 23.8*  MCV 90.5 91.8 88.9 87.2  PLT 226 201 189 203      HbA1C: Hemoglobin A1C  Date/Time Value Ref Range Status  03/30/2022 12:00 PM 5.1 4.0 - 5.6 % Final  08/28/2021 08:46 AM 5.0 4.0 - 5.6 % Final   Hgb A1c MFr Bld  Date/Time Value Ref Range Status  05/25/2023 10:24 AM 3.9 (L) 4.8 - 5.6 % Final    Comment:    (NOTE) Pre diabetes:          5.7%-6.4%  Diabetes:              >6.4%  Glycemic control for   <7.0% adults with diabetes   12/18/2020 08:34 AM 5.3 4.8 - 5.6 % Final    Comment:    (NOTE)         Prediabetes: 5.7 - 6.4         Diabetes: >6.4         Glycemic control for adults with diabetes: <7.0     Urinalysis: No results for input(s): COLORURINE, LABSPEC, PHURINE, GLUCOSEU, HGBUR, BILIRUBINUR, KETONESUR, PROTEINUR, UROBILINOGEN, NITRITE, LEUKOCYTESUR in the last 72 hours.  Invalid input(s): APPERANCEUR    Imaging: No results found.   Medications:    cefTRIAXone  (ROCEPHIN )  IV 2 g (08/23/23 2350)    aspirin   81 mg Oral Daily  azithromycin   500 mg Oral Daily   budesonide -glycopyrrolate -formoterol   2 puff Inhalation BID   Chlorhexidine  Gluconate Cloth  6 each Topical Q0600   donepezil   10 mg Oral QHS   ferrous sulfate   325 mg Oral Q breakfast   guaiFENesin   600 mg Oral BID   heparin   5,000 Units Subcutaneous Q8H   hydrALAZINE   100 mg Oral BID   insulin  aspart  0-9 Units Subcutaneous TID WC   pantoprazole   40 mg Oral Daily   predniSONE   40 mg Oral Q breakfast   QUEtiapine   50 mg Oral QPM   Vitamin D  (Ergocalciferol )  50,000 Units Oral Q7 days    Assessment/ Plan:     89 year old female with history of hypertension, coronary artery disease, congestive heart failure, COPD, dementia, end-stage renal disease on hemodialysis on a TTS schedule now admitted with history of confusion, cough and found to have COPD exacerbation.  #1: ESRD: Continue hemodialysis on TTS schedule.  Tolerating treatment well.  Will attempt fluid removal as tolerated.  #2: COPD/sepsis: Patient has been on  azithromycin  and also Rocephin .  Patient has been on steroids..  #3: Anemia: Continue iron .  #4: Secondary hyperparathyroidism: Will continue vitamin D  supplementation.  Continue to monitor PTH, calcium  and phosphorus levels.  #5: Hypertension: Continue hydralazine  as ordered.  Labs and medications reviewed. Will continue to follow along with you.   LOS: 2 Pinkey Edman, MD [facility] 8/16/20254:59 PM

## 2023-08-24 NOTE — TOC Progression Note (Signed)
 Transition of Care [facility]) - Progression Note    Patient Details  Name: Jennifer Carrillo MRN: [id number] Date of Birth: Jul 14, 1933  [facility] CM/SW Contact  Marinda Cooks, RN Phone Number: 08/24/2023, 1:54 PM  Clinical Narrative:    This CM spoke with pt and husband introduced role completed Initial assessement. Informed  by bedside nurse Pt A&Ox 4  Pt husband provided assessment info sharing pt  lives with him  in a ranch style home . Pt has family support provided by  husband and adult children.Pt uses [facility] [address] [place], [place], [place] [address] [phone] & PCP is Dr. Sigrid Mushtaq [facility] [address] [place], [place], [address] and has rollator & oxygen via [employer]  that she only use at night.  Pt not currently receives Dialysis with [facility] in [place], [place], [place] chair time is 6:45am TOC  will cont to follow pt during hospital stay and update as applicable.       Expected Discharge Plan and Services  TBD       Social Drivers of Health (SDOH) Interventions SDOH Screenings   Food Insecurity: No Food Insecurity (08/22/2023)  Recent Concern: Food Insecurity - Food Insecurity Present (07/17/2023)   Received from [facility]  Housing: Low Risk  (08/22/2023)  Transportation Needs: No Transportation Needs (08/22/2023)  Recent Concern: Transportation Needs - Unmet Transportation Needs (07/17/2023)   Received from [facility]  Utilities: Not At Risk (08/22/2023)  Alcohol Screen: Low Risk  (06/11/2018)  Depression (PHQ2-9): Low Risk  (08/16/2023)  Financial Resource Strain: High Risk (07/17/2023)   Received from [facility]  Social Connections: Moderately Isolated (08/22/2023)  Tobacco Use: Medium Risk (08/21/2023)  Health Literacy: Low Risk  (04/17/2020)   Received from [facility]    Readmission Risk Interventions    12/19/2020    1:33 PM  Readmission Risk Prevention Plan   Transportation Screening Complete  Medication Review (RN Care Manager) Complete  PCP or Specialist appointment within 3-5 days of discharge Complete  HRI or Home Care Consult Complete  SW Recovery Care/Counseling Consult Complete  Palliative Care Screening Not Applicable  Skilled Nursing Facility Complete

## 2023-08-24 NOTE — Progress Notes (Signed)
 Hemodialysis Note:  Received patient in bed to unit. Alert and oriented. Informed consent singed and in chart.  Treatment initiated: 1454 Treatment completed: 1757  Access used: Right Subclavian catheter Access issues: None  Patient tolerated well. Transported back to room, alert without acute distress. Report given to patient's RN.  Total UF removed: 500 ml Medications given: None  Post HD weight: 72.7 Kg  [facility]

## 2023-08-24 NOTE — Evaluation (Signed)
 Physical Therapy Evaluation Patient Details Name: Jennifer Carrillo MRN: [id number] DOB: 08/19/1933 Today's Date: 08/24/2023  History of Present Illness  Jennifer Carrillo is a 89 y.o. female with medical history significant for HTN, ESRD on HD TTS, CAD, PVD/carotid artery disease s/p stenting, COPD on home O2 2 L, dementia, upper GI bleed from gastric AVM (05/2023), being admitted with sepsis secondary to multifocal pneumonia after presenting with 2-day history of cough and congestion and now weakness and confusion.   Clinical Impression  Patient received sitting in chair with husband [name] at chairside. Patient agreeable to PT evaluation. Both she and her husband are eager to go home. Patient has dementia at baseline and unreliable historian so [name] provided history (although patient eager to be involved in conversation). She lives at home with [name] where she has 24/7 supervision and assistance with ADLs and IADLs from her family at baseline (daughter helps with bathing alternating weeks, sons help her to/from house to vehicle to go to doctor's office/dialysis). She ambulates short distances in the home with rollator but gets short of breath quickly that limits her. Upon PT evaluation, patient was mod I for bed mobility, completed transfers and ambulation up to 50 feet with RW and CGA. She needed supervision for safety and got lost when she ambulated too far from husband to see him or her chair/bed. Husband states she is supposed to use O2 at home at night, but she refuses to wear it. She completed today's session without O2 except briefly she worse 3L/min directly after ambulation when the pulse ox would not read and she was demonstrating increased labor of breathing. However, the pulse ox read within 30 seconds at 100% and remained this high when O2 was then removed, suggesting if SpO2 dropped while ambulation it returned to normal very quickly. Patient appears to have experience a decline in functional  mobility and strength and would benefit from continued PT once she discharges from the acute care setting. Patient would benefit from skilled physical therapy to address impairments and functional limitations (see PT Problem List below) to work towards stated goals and return to PLOF or maximal functional independence.        If plan is discharge home, recommend the following: A little help with walking and/or transfers;A little help with bathing/dressing/bathroom;Help with stairs or ramp for entrance;Assist for transportation;Supervision due to cognitive status;Assistance with cooking/housework   Can travel by private vehicle        Equipment Recommendations None recommended by PT  Recommendations for Other Services       Functional Status Assessment Patient has had a recent decline in their functional status and demonstrates the ability to make significant improvements in function in a reasonable and predictable amount of time.     Precautions / Restrictions Precautions Precautions: Fall Recall of Precautions/Restrictions: Impaired      Mobility  Bed Mobility Overal bed mobility: Modified Independent             General bed mobility comments: supine <> sit mod I    Transfers Overall transfer level: Needs assistance Equipment used: Rolling walker (2 wheels) Transfers: Sit to/from Stand Sit to Stand: Contact guard assist           General transfer comment: patient completes sit <> chair to bed with CGA and increased effort. She demonstrates fair body mechanics. Is mildly impulsive    Ambulation/Gait Ambulation/Gait assistance: Contact guard assist   Assistive device: Rolling walker (2 wheels) Gait Pattern/deviations: Trunk flexed, Decreased  stride length       General Gait Details: Patient ambulates approx 50 feet with RW with CGA. She gets short of breath quickly and requests to rest after about 25 feet of ambulation. Needs encouragement to continue walking  back to bed instead of standing with elbows resting on RW. Gets lost after losing visual contact with chair and husband.  Stairs            Wheelchair Mobility     Tilt Bed    Modified Rankin (Stroke Patients Only)       Balance Overall balance assessment: Needs assistance Sitting-balance support: No upper extremity supported, Feet supported Sitting balance-Leahy Scale: Good     Standing balance support: Bilateral upper extremity supported, During functional activity, Reliant on assistive device for balance Standing balance-Leahy Scale: Poor Standing balance comment: reliant on B UE support on RW during ambulation and transfers                             Pertinent Vitals/Pain Pain Assessment Pain Assessment: No/denies pain    Home Living Family/patient expects to be discharged to:: Private residence Living Arrangements: Spouse/significant other Available Help at Discharge: Family;Available 24 hours/day Type of Home: House Home Access: Stairs to enter Entrance Stairs-Rails: None Entrance Stairs-Number of Steps: 1   Home Layout: One level Home Equipment: Agricultural consultant (2 wheels);Rollator (4 wheels);BSC/3in1      Prior Function Prior Level of Function : Needs assist             Mobility Comments: Patient ambulates with rollator short distances in the house (unclear if it is mod I or with assistance). She is most limited by shortness of breath. Her son's help her out to the car to go to the doctor where she uses a W/C. She has 24/7 supervision from family. no falls in last 6 months. ADLs Comments: Patient needs help from her family for ADLs and is dependent for IADLs. She takes sponge baths at baseline (usually provided by daughter who can help alternating weeks (works one week out of town).     Extremity/Trunk Assessment   Upper Extremity Assessment Upper Extremity Assessment: Overall WFL for tasks assessed    Lower Extremity  Assessment Lower Extremity Assessment: Generalized weakness    Cervical / Trunk Assessment Cervical / Trunk Assessment: Normal  Communication   Communication Communication: Impaired Factors Affecting Communication: Hearing impaired    Cognition Arousal: Alert Behavior During Therapy: Agitated, Impulsive   PT - Cognitive impairments: History of cognitive impairments                       PT - Cognition Comments: Pateint with dementia at baseline. Is unable to answer questions accurately (but is very interested in being part of the conversation), so history is provided by husband [name]. She cannot remember where to walk to get back to chair after turning a corner where it is no longer visible. [name] states she needs 24/7 supervision at home at baseline. Following commands: Intact       Cueing Cueing Techniques: Verbal cues, Gestural cues, Visual cues     General Comments General comments (skin integrity, edema, etc.): Patient off of O2 upon arrival ([name] states she has it at home but refuses to wear it even at night). Mobility completed on room air. SpO2 readings always 99-100% and HR WFL but patient demo significant labor of breathing with ambulation and 3L  O2 was applied breifly after she sat and pulse ox would not read. It was less than 1 min of O2 application before pulse ox read 100%: removed O2 and SpO2 remained 100%.    Exercises Other Exercises Other Exercises: patient and husband [name] educated on role of PT in acute care setting and discharge reccomendations. Pateint educated on safe mobility techniques.   Assessment/Plan    PT Assessment Patient needs continued PT services  PT Problem List Decreased strength;Decreased safety awareness;Decreased mobility;Decreased knowledge of precautions;Decreased activity tolerance;Decreased cognition;Cardiopulmonary status limiting activity;Decreased balance;Decreased knowledge of use of DME       PT Treatment Interventions  DME instruction;Therapeutic exercise;Gait training;Balance training;Stair training;Neuromuscular re-education;Functional mobility training;Cognitive remediation;Therapeutic activities;Patient/family education    PT Goals (Current goals can be found in the Care Plan section)  Acute Rehab PT Goals Patient Stated Goal: go home today PT Goal Formulation: With patient/family Time For Goal Achievement: 08/24/23 Potential to Achieve Goals: Good    Frequency Min 2X/week     Co-evaluation               AM-PAC PT 6 Clicks Mobility  Outcome Measure Help needed turning from your back to your side while in a flat bed without using bedrails?: None Help needed moving from lying on your back to sitting on the side of a flat bed without using bedrails?: None Help needed moving to and from a bed to a chair (including a wheelchair)?: A Little Help needed standing up from a chair using your arms (e.g., wheelchair or bedside chair)?: A Little Help needed to walk in hospital room?: A Little Help needed climbing 3-5 steps with a railing? : A Lot 6 Click Score: 19    End of Session Equipment Utilized During Treatment: Gait belt;Oxygen Activity Tolerance: Patient limited by fatigue;Patient tolerated treatment well Patient left: with call bell/phone within reach;with bed alarm set;in bed;with family/visitor present (seated at side of bed eating lunch) Nurse Communication: Mobility status PT Visit Diagnosis: Muscle weakness (generalized) (M62.81);Difficulty in walking, not elsewhere classified (R26.2)    Time: 8843-8777 PT Time Calculation (min) (ACUTE ONLY): 26 min   Charges:   PT Evaluation $PT Eval Moderate Complexity: 1 Mod PT Treatments $Therapeutic Activity: 8-22 mins PT General Charges $$ ACUTE PT VISIT: 1 Visit         Camie R. Juli, PT, DPT, Cert. MDT 08/24/23, 12:45 PM  [facility] [address] [place], [place] [address] P: [phone]  I F: [phone]

## 2023-08-25 DIAGNOSIS — A419 Sepsis, unspecified organism: Secondary | ICD-10-CM | POA: Diagnosis not present

## 2023-08-25 DIAGNOSIS — J189 Pneumonia, unspecified organism: Secondary | ICD-10-CM | POA: Diagnosis not present

## 2023-08-25 LAB — BASIC METABOLIC PANEL WITH GFR
Anion gap: 11 (ref 5–15)
BUN: 32 mg/dL — ABNORMAL HIGH (ref 8–23)
CO2: 27 mmol/L (ref 22–32)
Calcium: 8.2 mg/dL — ABNORMAL LOW (ref 8.9–10.3)
Chloride: 97 mmol/L — ABNORMAL LOW (ref 98–111)
Creatinine, Ser: 3.66 mg/dL — ABNORMAL HIGH (ref 0.44–1.00)
GFR, Estimated: 11 mL/min — ABNORMAL LOW (ref >60)
Glucose, Bld: 165 mg/dL — ABNORMAL HIGH (ref 70–99)
Potassium: 3.8 mmol/L (ref 3.5–5.1)
Sodium: 135 mmol/L (ref 135–145)

## 2023-08-25 LAB — GLUCOSE, CAPILLARY
Glucose-Capillary: 172 mg/dL — ABNORMAL HIGH (ref 70–99)
Glucose-Capillary: 128 mg/dL — ABNORMAL HIGH (ref 70–99)
Glucose-Capillary: 211 mg/dL — ABNORMAL HIGH (ref 70–99)
Glucose-Capillary: 217 mg/dL — ABNORMAL HIGH (ref 70–99)

## 2023-08-25 LAB — CBC
HCT: 24 % — ABNORMAL LOW (ref 36.0–46.0)
Hemoglobin: 7.2 g/dL — ABNORMAL LOW (ref 12.0–15.0)
MCH: 26.7 pg (ref 26.0–34.0)
MCHC: 30 g/dL (ref 30.0–36.0)
MCV: 88.9 fL (ref 80.0–100.0)
Platelets: 197 K/uL (ref 150–400)
RBC: 2.7 MIL/uL — ABNORMAL LOW (ref 3.87–5.11)
RDW: 17.8 % — ABNORMAL HIGH (ref 11.5–15.5)
WBC: 8.1 K/uL (ref 4.0–10.5)
nRBC: 0 % (ref 0.0–0.2)

## 2023-08-25 LAB — CULTURE, RESPIRATORY W GRAM STAIN: Culture: NORMAL

## 2023-08-25 LAB — PREPARE RBC (CROSSMATCH)

## 2023-08-25 LAB — PHOSPHORUS: Phosphorus: 3.9 mg/dL (ref 2.5–4.6)

## 2023-08-25 LAB — MAGNESIUM: Magnesium: 2.3 mg/dL (ref 1.7–2.4)

## 2023-08-25 MED ORDER — SODIUM CHLORIDE 0.9% IV SOLUTION: Freq: Once | INTRAVENOUS | Status: AC

## 2023-08-25 MED ORDER — ASPIRIN 81 MG PO TBEC
81.0000 mg | DELAYED_RELEASE_TABLET | Freq: Every day | ORAL | Status: DC
  Administered 2023-08-25 – 2023-08-27 (×3): 81 mg via ORAL
  Filled 2023-08-25 (×3): qty 1

## 2023-08-25 NOTE — Progress Notes (Addendum)
 [facility] Progress Note  Patient: Jennifer Carrillo    FMW:[id number]  DOA: 08/21/2023     Date of Service: the patient was seen and examined on 08/25/2023  Chief Complaint  Patient presents with   Altered Mental Status   Brief hospital course: Jennifer Carrillo is a 89 y.o. female with medical history significant for HTN, ESRD on HD TTS, CAD, PVD/carotid artery disease s/p stenting, COPD on home O2 2 L, dementia, upper GI bleed from gastric AVM (05/2023), being admitted with sepsis secondary to multifocal pneumonia after presenting with 2-day history of cough and congestion and now weakness and confusion.  She has had no chest pain, fever or chills. In the ED, Tmax 99.1 and tachycardic to the 130s and 140s, tachypneic to 28, O2 sats near 100% on 2 L Labs notable for WBC 13,000 with lactic acid 1.2 and negative respiratory viral panel. Troponin 35 and BNP 2260 Hemoglobin at baseline at 9.3 Potassium 3.3.  Renal function reflecting dialysis status EKG showed sinus tachycardia with RBBB, T wave inversion in lateral leads. CTA chest negative for PE with findings concerning for multifocal patchy airspace disease worrisome for multifocal pneumonia. Patient started on Rocephin  and azithromycin .  Initiated on sepsis fluids Admission requested    Assessment and Plan:  # Sepsis due to pneumonia  Sepsis criteria include fever, tachycardia and tachypnea with leukocytosis, altered mental status  History of affected contacts per respiratory viral panel negative Rocephin  and azithromycin  Received an initial fluid bolus, will rebolus as needed in view of dialysis status Antitussives, DuoNebs as needed SLP consult and aspiration precaution 8/14 Solu-Medrol  IV 80 mg x 1 dose followed by 40 mg IV twice daily followed by prednisone  40 mg p.o. daily for 3 days Continue Breztri  inhaler Continue spirometry, flutter valve Follow sputum culture and blood culture   Dementia (HCC) Mild delirium/acute  toxic metabolic encephalopathy Patient presented with confusion in the setting of acute infection Neurologic checks, fall and aspiration precautions Continue Aricept    COPD exacerbation Chronic respiratory failure with hypoxia Acute exacerbation not suspected As needed DuoNebs Supplemental oxygen Started steroids as above   ESRD on hemodialysis [facility]) Nephrology consult for continuation of dialysis No missed or incomplete dialysis sessions HD TTS schedule   Coronary artery disease involving native coronary artery of native heart Elevated troponin of 35, suspect demand No complaints of chest pain, EKG nonacute Continue aspirin , atorvastatin  and Imdur    PAD (HCC) History of stenting of carotid artery, femoral artery and renal artery No acute issues Continue aspirin  and statin   AVM (arteriovenous malformation) of duodenum, acquired History of upper GI bleed 05/2023 (AVM) No acute issues suspected Continue Protonix , ferrous sulfate    # Anemia of chronic kidney failure, stage 5 (HCC) Iron  deficiency, Tsat 10%, folic acid  within normal range. Continue oral iron  supplement 8/16 patient is poor historian, as per her husband she gets blood transfusion as an outpatient. 8/17 Hb 7.2, borderline low.  Patient agreeable to blood transfusion.  1 unit PRBC transfused. Monitor H&H    # Vitamin D  insufficiency: started vitamin D  50,000 units p.o. weekly, follow with PCP to repeat vitamin D  level after 3 to 6 months.  # Sundowning, started Seroquel  50 mg p.o. every evening    Body mass index is 25.1 kg/m.  Interventions:   Diet: Heart healthy/carb modified DVT Prophylaxis: Subcutaneous Heparin     Advance goals of care discussion: Full code  Family Communication: family was not present at bedside, at the time of interview.  The pt provided permission to discuss medical plan with the family. Opportunity was given to ask question and all questions were answered satisfactorily.    Disposition:  Pt is from home, admitted with pneumonia, respiratory failure, still has shortness of breath and on IV antibiotics, which precludes a safe discharge. Discharge to home, when stable, may need few days to improve.  Follow-up PT and OT eval for DC plan, most likely discharge home with home services tomorrow a.m.  Subjective: No significant events overnight, patient was laying orally, still has productive cough, breathing is better. Patient agreed for blood transfusion. Denied any other complaints.    Physical Exam: General: NAD, lying comfortably Appear in no distress, affect appropriate Eyes: PERRLA ENT: Oral Mucosa Clear, moist  Neck: no JVD,  Cardiovascular: S1 and S2 Present, no Murmur,  Respiratory: Equal air entry bilaterally, bilateral crackles and mild wheezes, gradually improving Abdomen: Bowel Sound present, Soft and no tenderness,  Skin: no rashes Extremities: no Pedal edema, no calf tenderness Neurologic: without any new focal findings Gait not checked due to patient safety concerns  Vitals:   08/25/23 1154 08/25/23 1228 08/25/23 1438 08/25/23 1512  BP: (!) 126/48 (!) 130/55 (!) 143/50 (!) 134/55  Pulse: 65 62 69 65  Resp:  18 (!) 24 18  Temp: 97.9 F (36.6 C) (!) 97.1 F (36.2 C) 97.9 F (36.6 C) 98.7 F (37.1 C)  TempSrc: Oral Axillary  Oral  SpO2: 100% 100% 100% 98%  Weight:      Height:        Intake/Output Summary (Last 24 hours) at 08/25/2023 1640 Last data filed at 08/25/2023 1512 Gross per 24 hour  Intake 1009.44 ml  Output 500 ml  Net 509.44 ml   Filed Weights   08/22/23 1733 08/24/23 1433 08/24/23 1757  Weight: 71.7 kg 73.2 kg 72.7 kg    Data Reviewed: I have personally reviewed and interpreted daily labs, tele strips, imagings as discussed above. I reviewed all nursing notes, pharmacy notes, vitals, pertinent old records I have discussed plan of care as described above with RN and patient/family.  CBC: Recent Labs  Lab  08/21/23 2009 08/22/23 1245 08/23/23 0404 08/24/23 0500 08/25/23 0737  WBC 13.0* 9.0 5.9 10.6* 8.1  NEUTROABS 9.4*  --   --   --   --   HGB 9.3* 7.8* 7.8* 7.4* 7.2*  HCT 30.5* 26.8* 25.7* 23.8* 24.0*  MCV 90.5 91.8 88.9 87.2 88.9  PLT 226 201 189 203 197   Basic Metabolic Panel: Recent Labs  Lab 08/21/23 2009 08/22/23 1245 08/23/23 0404 08/24/23 0500 08/25/23 0737  NA 141 141 139 135 135  K 3.3* 3.2* 3.6 3.8 3.8  CL 103 101 102 99 97*  CO2 23 23 25 25 27   GLUCOSE 118* 69* 153* 220* 165*  BUN 41* 46* 26* 51* 32*  CREATININE 6.12* 6.60* 3.77* 5.48* 3.66*  CALCIUM  8.5* 8.1* 8.4* 8.5* 8.2*  MG  --  2.2 2.2 2.5* 2.3  PHOS  --  4.8*  4.9* 4.6 6.5* 3.9    Studies: No results found.   Scheduled Meds:  aspirin  EC  81 mg Oral Daily   azithromycin   500 mg Oral Daily   budesonide -glycopyrrolate -formoterol   2 puff Inhalation BID   Chlorhexidine  Gluconate Cloth  6 each Topical Q0600   donepezil   10 mg Oral QHS   ferrous sulfate   325 mg Oral Q breakfast   guaiFENesin   600 mg Oral BID   heparin   5,000 Units Subcutaneous  Q8H   hydrALAZINE   100 mg Oral BID   insulin  aspart  0-9 Units Subcutaneous TID WC   pantoprazole   40 mg Oral Daily   predniSONE   40 mg Oral Q breakfast   QUEtiapine   50 mg Oral QPM   Vitamin D  (Ergocalciferol )  50,000 Units Oral Q7 days   Continuous Infusions:  cefTRIAXone  (ROCEPHIN )  IV Stopped (08/25/23 0159)   PRN Meds: acetaminophen  **OR** acetaminophen , guaiFENesin -dextromethorphan , HYDROmorphone , ipratropium-albuterol , ondansetron  **OR** ondansetron  (ZOFRAN ) IV  Time spent: 55 minutes  Author: ELVAN SOR. MD [facility] 08/25/2023 4:40 PM  To reach On-call, see care teams to locate the attending and reach out to them via [email or web address]. If 7PM-7AM, please contact night-coverage If you still have difficulty reaching the attending provider, please page the [facility] (Director on Call) for [facility] on amion for assistance.

## 2023-08-25 NOTE — Progress Notes (Signed)
 Telephone blood consent obtained from [place], daughter by this nurse and [place], Charity fundraiser. This nurse informed Jennifer Carrillo of blood administration and Jennifer Carrillo verbalized understanding and agreement.

## 2023-08-25 NOTE — Plan of Care (Signed)
  Problem: Fluid Volume: Goal: Hemodynamic stability will improve Outcome: Progressing   Problem: Clinical Measurements: Goal: Diagnostic test results will improve Outcome: Progressing Goal: Signs and symptoms of infection will decrease Outcome: Progressing   Problem: Respiratory: Goal: Ability to maintain adequate ventilation will improve Outcome: Progressing   Problem: Clinical Measurements: Goal: Ability to maintain clinical measurements within normal limits will improve Outcome: Progressing   Problem: Activity: Goal: Risk for activity intolerance will decrease Outcome: Progressing   Problem: Coping: Goal: Level of anxiety will decrease Outcome: Progressing   Problem: Safety: Goal: Ability to remain free from injury will improve Outcome: Progressing   Problem: Skin Integrity: Goal: Risk for impaired skin integrity will decrease Outcome: Progressing

## 2023-08-25 NOTE — Progress Notes (Signed)
 [facility]  PROGRESS NOTE   Subjective:   Patient seen at bedside.  Comfortable.  Objective:  Vital signs: Blood pressure (!) 130/55, pulse (P) 62, temperature (!) 97.1 F (36.2 C), temperature source (P) Axillary, resp. rate (P) 18, height 5' 7 (1.702 m), weight 72.7 kg, SpO2 (P) 100%.  Intake/Output Summary (Last 24 hours) at 08/25/2023 1255 Last data filed at 08/25/2023 1223 Gross per 24 hour  Intake 680.11 ml  Output 500 ml  Net 180.11 ml   Filed Weights   08/22/23 1733 08/24/23 1433 08/24/23 1757  Weight: 71.7 kg 73.2 kg 72.7 kg     Physical Exam: General:  No acute distress  Head:  Normocephalic, atraumatic. Moist oral mucosal membranes  Eyes:  Anicteric  Neck:  Supple  Lungs:   Clear to auscultation, normal effort  Heart:  S1S2 no rubs  Abdomen:   Soft, nontender, bowel sounds present  Extremities:  peripheral edema.  Neurologic:  Awake, alert, following commands  Skin:  No lesions  Access:     Basic Metabolic Panel: Recent Labs  Lab 08/21/23 2009 08/22/23 1245 08/23/23 0404 08/24/23 0500 08/25/23 0737  NA 141 141 139 135 135  K 3.3* 3.2* 3.6 3.8 3.8  CL 103 101 102 99 97*  CO2 23 23 25 25 27   GLUCOSE 118* 69* 153* 220* 165*  BUN 41* 46* 26* 51* 32*  CREATININE 6.12* 6.60* 3.77* 5.48* 3.66*  CALCIUM  8.5* 8.1* 8.4* 8.5* 8.2*  MG  --  2.2 2.2 2.5* 2.3  PHOS  --  4.8*  4.9* 4.6 6.5* 3.9   GFR: Estimated Creatinine Clearance: 10.3 mL/min (A) (by C-G formula based on SCr of 3.66 mg/dL (H)).  Liver Function Tests: Recent Labs  Lab 08/22/23 1245  ALBUMIN 2.6*   No results for input(s): LIPASE, AMYLASE in the last 168 hours. No results for input(s): AMMONIA in the last 168 hours.  CBC: Recent Labs  Lab 08/21/23 2009 08/22/23 1245 08/23/23 0404 08/24/23 0500 08/25/23 0737  WBC 13.0* 9.0 5.9 10.6* 8.1  NEUTROABS 9.4*  --   --   --   --   HGB 9.3* 7.8* 7.8* 7.4* 7.2*  HCT 30.5* 26.8* 25.7* 23.8* 24.0*  MCV 90.5 91.8  88.9 87.2 88.9  PLT 226 201 189 203 197     HbA1C: Hemoglobin A1C  Date/Time Value Ref Range Status  03/30/2022 12:00 PM 5.1 4.0 - 5.6 % Final  08/28/2021 08:46 AM 5.0 4.0 - 5.6 % Final   Hgb A1c MFr Bld  Date/Time Value Ref Range Status  05/25/2023 10:24 AM 3.9 (L) 4.8 - 5.6 % Final    Comment:    (NOTE) Pre diabetes:          5.7%-6.4%  Diabetes:              >6.4%  Glycemic control for   <7.0% adults with diabetes   12/18/2020 08:34 AM 5.3 4.8 - 5.6 % Final    Comment:    (NOTE)         Prediabetes: 5.7 - 6.4         Diabetes: >6.4         Glycemic control for adults with diabetes: <7.0     Urinalysis: No results for input(s): COLORURINE, LABSPEC, PHURINE, GLUCOSEU, HGBUR, BILIRUBINUR, KETONESUR, PROTEINUR, UROBILINOGEN, NITRITE, LEUKOCYTESUR in the last 72 hours.  Invalid input(s): APPERANCEUR    Imaging: No results found.   Medications:    cefTRIAXone  (ROCEPHIN )  IV Stopped (  08/25/23 0159)    aspirin  EC  81 mg Oral Daily   azithromycin   500 mg Oral Daily   budesonide -glycopyrrolate -formoterol   2 puff Inhalation BID   Chlorhexidine  Gluconate Cloth  6 each Topical Q0600   donepezil   10 mg Oral QHS   ferrous sulfate   325 mg Oral Q breakfast   guaiFENesin   600 mg Oral BID   heparin   5,000 Units Subcutaneous Q8H   hydrALAZINE   100 mg Oral BID   insulin  aspart  0-9 Units Subcutaneous TID WC   pantoprazole   40 mg Oral Daily   predniSONE   40 mg Oral Q breakfast   QUEtiapine   50 mg Oral QPM   Vitamin D  (Ergocalciferol )  50,000 Units Oral Q7 days    Assessment/ Plan:     89 year old female with history of hypertension, coronary artery disease, congestive heart failure, COPD, dementia, end-stage renal disease on hemodialysis on a TTS schedule now admitted with history of confusion, cough and found to have COPD exacerbation.   #1: ESRD: Continue hemodialysis on TTS schedule.  Tolerating treatment well.  Will attempt fluid removal as  tolerated.   #2: COPD/sepsis: Patient has been on azithromycin  and also Rocephin .  Patient has been on steroids..   #3: Anemia: Continue iron . Transfusion if need be.   #4: Secondary hyperparathyroidism: Will continue vitamin D  supplementation.  Continue to monitor PTH, calcium  and phosphorus levels.   #5: Hypertension: Continue hydralazine  as ordered.    Labs and medications reviewed. Will continue to follow along with you.   LOS: 3 Pinkey Edman, MD [facility] 8/17/202512:55 PM

## 2023-08-26 ENCOUNTER — Encounter: Payer: Self-pay | Admitting: Internal Medicine

## 2023-08-26 ENCOUNTER — Other Ambulatory Visit: Payer: Self-pay

## 2023-08-26 DIAGNOSIS — A419 Sepsis, unspecified organism: Secondary | ICD-10-CM | POA: Diagnosis not present

## 2023-08-26 DIAGNOSIS — J189 Pneumonia, unspecified organism: Secondary | ICD-10-CM | POA: Diagnosis not present

## 2023-08-26 LAB — TYPE AND SCREEN
ABO/RH(D): O POS
Antibody Screen: NEGATIVE
Unit division: 0

## 2023-08-26 LAB — BASIC METABOLIC PANEL WITH GFR
Anion gap: 13 (ref 5–15)
BUN: 54 mg/dL — ABNORMAL HIGH (ref 8–23)
CO2: 25 mmol/L (ref 22–32)
Calcium: 8.3 mg/dL — ABNORMAL LOW (ref 8.9–10.3)
Chloride: 96 mmol/L — ABNORMAL LOW (ref 98–111)
Creatinine, Ser: 4.97 mg/dL — ABNORMAL HIGH (ref 0.44–1.00)
GFR, Estimated: 8 mL/min — ABNORMAL LOW (ref >60)
Glucose, Bld: 173 mg/dL — ABNORMAL HIGH (ref 70–99)
Potassium: 4.1 mmol/L (ref 3.5–5.1)
Sodium: 134 mmol/L — ABNORMAL LOW (ref 135–145)

## 2023-08-26 LAB — CBC
HCT: 27.6 % — ABNORMAL LOW (ref 36.0–46.0)
Hemoglobin: 8.8 g/dL — ABNORMAL LOW (ref 12.0–15.0)
MCH: 27.6 pg (ref 26.0–34.0)
MCHC: 31.9 g/dL (ref 30.0–36.0)
MCV: 86.5 fL (ref 80.0–100.0)
Platelets: 199 K/uL (ref 150–400)
RBC: 3.19 MIL/uL — ABNORMAL LOW (ref 3.87–5.11)
RDW: 17.2 % — ABNORMAL HIGH (ref 11.5–15.5)
WBC: 8.8 K/uL (ref 4.0–10.5)
nRBC: 0 % (ref 0.0–0.2)

## 2023-08-26 LAB — GLUCOSE, CAPILLARY
Glucose-Capillary: 117 mg/dL — ABNORMAL HIGH (ref 70–99)
Glucose-Capillary: 165 mg/dL — ABNORMAL HIGH (ref 70–99)
Glucose-Capillary: 174 mg/dL — ABNORMAL HIGH (ref 70–99)
Glucose-Capillary: 141 mg/dL — ABNORMAL HIGH (ref 70–99)

## 2023-08-26 LAB — BPAM RBC
Blood Product Expiration Date: 202509142359
ISSUE DATE / TIME: 202508171206
Unit Type and Rh: 5100

## 2023-08-26 MED ORDER — FERROUS SULFATE 325 (65 FE) MG PO TABS
325.0000 mg | ORAL_TABLET | Freq: Every day | ORAL | 2 refills | Status: AC
  Filled 2023-08-26: qty 30, 30d supply, fill #0

## 2023-08-26 MED ORDER — VITAMIN D (ERGOCALCIFEROL) 1.25 MG (50000 UNIT) PO CAPS
50000.0000 [IU] | ORAL_CAPSULE | ORAL | 2 refills | Status: AC
  Filled 2023-08-26: qty 4, 28d supply, fill #0

## 2023-08-26 MED ORDER — QUETIAPINE FUMARATE 50 MG PO TABS
50.0000 mg | ORAL_TABLET | Freq: Every evening | ORAL | 0 refills | Status: AC
  Filled 2023-08-26: qty 30, 30d supply, fill #0

## 2023-08-26 MED ORDER — DEXTROMETHORPHAN-GUAIFENESIN 20-200 MG/20ML PO LIQD
10.0000 mL | Freq: Four times a day (QID) | ORAL | 0 refills | Status: AC | PRN
  Filled 2023-08-26: qty 118, 1d supply, fill #0

## 2023-08-26 MED ORDER — ALBUTEROL SULFATE HFA 108 (90 BASE) MCG/ACT IN AERS
2.0000 | INHALATION_SPRAY | Freq: Four times a day (QID) | RESPIRATORY_TRACT | 2 refills | Status: AC | PRN
  Filled 2023-08-26: qty 6.7, 30d supply, fill #0

## 2023-08-26 MED ORDER — GUAIFENESIN ER 600 MG PO TB12
600.0000 mg | ORAL_TABLET | Freq: Two times a day (BID) | ORAL | 0 refills | Status: AC
  Filled 2023-08-26: qty 10, 5d supply, fill #0

## 2023-08-26 MED ORDER — BREZTRI AEROSPHERE 160-9-4.8 MCG/ACT IN AERO
2.0000 | INHALATION_SPRAY | Freq: Two times a day (BID) | RESPIRATORY_TRACT | 11 refills | Status: AC
  Filled 2023-08-26: qty 10.7, 30d supply, fill #0

## 2023-08-26 NOTE — Progress Notes (Signed)
 [facility] Progress Note  Patient: Jennifer Carrillo    FMW:[id number]  DOA: 08/21/2023     Date of Service: the patient was seen and examined on 08/26/2023  Chief Complaint  Patient presents with   Altered Mental Status   Brief hospital course: Jennifer Carrillo is a 89 y.o. female with medical history significant for HTN, ESRD on HD TTS, CAD, PVD/carotid artery disease s/p stenting, COPD on home O2 2 L, dementia, upper GI bleed from gastric AVM (05/2023), being admitted with sepsis secondary to multifocal pneumonia after presenting with 2-day history of cough and congestion and now weakness and confusion.  She has had no chest pain, fever or chills. In the ED, Tmax 99.1 and tachycardic to the 130s and 140s, tachypneic to 28, O2 sats near 100% on 2 L Labs notable for WBC 13,000 with lactic acid 1.2 and negative respiratory viral panel. Troponin 35 and BNP 2260 Hemoglobin at baseline at 9.3 Potassium 3.3.  Renal function reflecting dialysis status EKG showed sinus tachycardia with RBBB, T wave inversion in lateral leads. CTA chest negative for PE with findings concerning for multifocal patchy airspace disease worrisome for multifocal pneumonia. Patient started on Rocephin  and azithromycin .  Initiated on sepsis fluids Admission requested    Assessment and Plan:  # Sepsis due to pneumonia  Sepsis criteria include fever, tachycardia and tachypnea with leukocytosis, altered mental status  History of affected contacts per respiratory viral panel negative S/p Rocephin  x 5 days and azithromycin  x 3 days Received an initial fluid bolus, no more further fluid due to dialysis status.  Vital signs remained stable Continue antitussives, DuoNebs as needed SLP consult and aspiration precaution 8/14 Solu-Medrol  IV 80 mg x 1 dose followed by 40 mg IV twice daily followed by prednisone  40 mg p.o. daily for 3 days Continue Breztri  inhaler Continue spirometry, flutter valve Sputum culture growing  rare gram-positive cocci  Dementia (HCC) Mild delirium/acute toxic metabolic encephalopathy Patient presented with confusion in the setting of acute infection Neurologic checks, fall and aspiration precautions Continue Aricept    COPD exacerbation Chronic respiratory failure with hypoxia Acute exacerbation not suspected As needed DuoNebs Supplemental oxygen Started steroids as above   ESRD on hemodialysis [facility]) Nephrology consult for continuation of dialysis No missed or incomplete dialysis sessions HD TTS schedule   Coronary artery disease involving native coronary artery of native heart Elevated troponin of 35, suspect demand No complaints of chest pain, EKG nonacute Continue aspirin , atorvastatin  and Imdur    PAD (HCC) History of stenting of carotid artery, femoral artery and renal artery No acute issues Continue aspirin  and statin   AVM (arteriovenous malformation) of duodenum, acquired History of upper GI bleed 05/2023 (AVM) No acute issues suspected Continue Protonix , ferrous sulfate    # Anemia of chronic kidney failure, stage 5 (HCC) Iron  deficiency, Tsat 10%, folic acid  within normal range. Continue oral iron  supplement 8/16 patient is poor historian, as per her husband she gets blood transfusion as an outpatient. 8/17 Hb 7.2, borderline low.  Patient agreeable to blood transfusion.  1 unit PRBC transfused. Monitor H&H    # Vitamin D  insufficiency: started vitamin D  50,000 units p.o. weekly, follow with PCP to repeat vitamin D  level after 3 to 6 months.  # Sundowning, started Seroquel  50 mg p.o. every evening    Body mass index is 25.1 kg/m.  Interventions:   Diet: Heart healthy/carb modified DVT Prophylaxis: Subcutaneous Heparin     Advance goals of care discussion: Full code  Family Communication: family  was not present at bedside, at the time of interview.  The pt provided permission to discuss medical plan with the family. Opportunity was given to  ask question and all questions were answered satisfactorily.   Disposition:  Pt is from home, admitted with pneumonia, respiratory failure, still has shortness of breath and on IV antibiotics, which precludes a safe discharge. Discharge to home, when stable, most likely tomorrow a.m. after hemodialysis.   Subjective: No significant events overnight, patient still has productive cough, breathing is getting better, but still has significant dyspnea on exertion.  No any other complaints.   Physical Exam: General: NAD, lying comfortably Appear in no distress, affect appropriate Eyes: PERRLA ENT: Oral Mucosa Clear, moist  Neck: no JVD,  Cardiovascular: S1 and S2 Present, no Murmur,  Respiratory: Equal air entry bilaterally, bilateral crackles and mild wheezes, gradually improving Abdomen: Bowel Sound present, Soft and no tenderness,  Skin: no rashes Extremities: no Pedal edema, no calf tenderness Neurologic: without any new focal findings Gait not checked due to patient safety concerns  Vitals:   08/25/23 2337 08/26/23 0412 08/26/23 0846 08/26/23 0848  BP:  (!) 136/54  (!) 139/49  Pulse: (!) 58 69  70  Resp:  18  18  Temp:  97.8 F (36.6 C) 98.5 F (36.9 C)   TempSrc:   Oral   SpO2: 98% 95%  100%  Weight:      Height:        Intake/Output Summary (Last 24 hours) at 08/26/2023 1602 Last data filed at 08/26/2023 1300 Gross per 24 hour  Intake 680 ml  Output --  Net 680 ml   Filed Weights   08/22/23 1733 08/24/23 1433 08/24/23 1757  Weight: 71.7 kg 73.2 kg 72.7 kg    Data Reviewed: I have personally reviewed and interpreted daily labs, tele strips, imagings as discussed above. I reviewed all nursing notes, pharmacy notes, vitals, pertinent old records I have discussed plan of care as described above with RN and patient/family.  CBC: Recent Labs  Lab 08/21/23 2009 08/22/23 1245 08/23/23 0404 08/24/23 0500 08/25/23 0737 08/26/23 0502  WBC 13.0* 9.0 5.9 10.6* 8.1  8.8  NEUTROABS 9.4*  --   --   --   --   --   HGB 9.3* 7.8* 7.8* 7.4* 7.2* 8.8*  HCT 30.5* 26.8* 25.7* 23.8* 24.0* 27.6*  MCV 90.5 91.8 88.9 87.2 88.9 86.5  PLT 226 201 189 203 197 199   Basic Metabolic Panel: Recent Labs  Lab 08/22/23 1245 08/23/23 0404 08/24/23 0500 08/25/23 0737 08/26/23 0502  NA 141 139 135 135 134*  K 3.2* 3.6 3.8 3.8 4.1  CL 101 102 99 97* 96*  CO2 23 25 25 27 25   GLUCOSE 69* 153* 220* 165* 173*  BUN 46* 26* 51* 32* 54*  CREATININE 6.60* 3.77* 5.48* 3.66* 4.97*  CALCIUM  8.1* 8.4* 8.5* 8.2* 8.3*  MG 2.2 2.2 2.5* 2.3  --   PHOS 4.8*  4.9* 4.6 6.5* 3.9  --     Studies: No results found.   Scheduled Meds:  aspirin  EC  81 mg Oral Daily   budesonide -glycopyrrolate -formoterol   2 puff Inhalation BID   Chlorhexidine  Gluconate Cloth  6 each Topical Q0600   donepezil   10 mg Oral QHS   ferrous sulfate   325 mg Oral Q breakfast   guaiFENesin   600 mg Oral BID   heparin   5,000 Units Subcutaneous Q8H   hydrALAZINE   100 mg Oral BID   insulin  aspart  0-9 Units Subcutaneous TID WC   pantoprazole   40 mg Oral Daily   QUEtiapine   50 mg Oral QPM   Vitamin D  (Ergocalciferol )  50,000 Units Oral Q7 days   Continuous Infusions:   PRN Meds: acetaminophen  **OR** acetaminophen , guaiFENesin -dextromethorphan , HYDROmorphone , ipratropium-albuterol , ondansetron  **OR** ondansetron  (ZOFRAN ) IV  Time spent: 35 minutes  Author: ELVAN SOR. MD [facility] 08/26/2023 4:02 PM  To reach On-call, see care teams to locate the attending and reach out to them via [email or web address]. If 7PM-7AM, please contact night-coverage If you still have difficulty reaching the attending provider, please page the [facility] (Director on Call) for [facility] on amion for assistance.

## 2023-08-26 NOTE — Progress Notes (Signed)
 D/C order noted.  Contacted [facility] advised of pt and d/c today. The pt should resume care on  8/19. D/C summary faxed to clinic for continuation of care.  Suzen Satchel Dialysis Navigator [phone]

## 2023-08-26 NOTE — Progress Notes (Addendum)
 [facility]  ROUNDING NOTE   Subjective:   Jennifer Carrillo 89 y.o. female with medical history significant of ESRD on HD TTS, HTN, PVD s/p multiple stenting procedures on Plavix , multivessel CAD with known RCA occlusion with collaterals, IDDM, chronic iron  deficiency anemia no longer on iron  supplement, COPD with chronic hypoxic respiratory failure on 2 L nightly, dementia, sent from dialysis center for evaluation of Sepsis due to pneumonia (HCC) [J18.9, A41.9] CAP (community acquired pneumonia) [J18.9]  Patient receives outpatient dialysis treatments at [facility] on a TTS schedule, supervised by [facility] physicians.    Patient seen laying in bed Ill appearing but states she feels better than yesterday Remains on room air  Objective:  Vital signs in last 24 hours:  Temp:  [97.8 F (36.6 C)-98.7 F (37.1 C)] 98.5 F (36.9 C) (08/18 0846) Pulse Rate:  [58-70] 70 (08/18 0848) Resp:  [18-24] 18 (08/18 0848) BP: (134-143)/(49-55) 139/49 (08/18 0848) SpO2:  [95 %-100 %] 100 % (08/18 0848)  Weight change:  Filed Weights   08/22/23 1733 08/24/23 1433 08/24/23 1757  Weight: 71.7 kg 73.2 kg 72.7 kg    Intake/Output: I/O last 3 completed shifts: In: 1209.4 [P.O.:520; I.V.:31.3; Blood:358; IV Piggyback:300.1] Out: -    Intake/Output this shift:  Total I/O In: 480 [P.O.:480] Out: -   Physical Exam: General: NAD, ill appearing  Head: Normocephalic, atraumatic. Moist oral mucosal membranes  Eyes: Anicteric  Lungs:  Mild wheeze, room air  Heart: Regular rate and rhythm  Abdomen:  Soft, nontender  Extremities:  No peripheral edema.  Neurologic: Awake, alert, conversant  Skin: Warm,dry, no rash  Access: Rt permcath    Basic Metabolic Panel: Recent Labs  Lab 08/22/23 1245 08/23/23 0404 08/24/23 0500 08/25/23 0737 08/26/23 0502  NA 141 139 135 135 134*  K 3.2* 3.6 3.8 3.8 4.1  CL 101 102 99 97* 96*  CO2 23 25 25 27 25   GLUCOSE 69* 153* 220* 165* 173*  BUN  46* 26* 51* 32* 54*  CREATININE 6.60* 3.77* 5.48* 3.66* 4.97*  CALCIUM  8.1* 8.4* 8.5* 8.2* 8.3*  MG 2.2 2.2 2.5* 2.3  --   PHOS 4.8*  4.9* 4.6 6.5* 3.9  --     Liver Function Tests: Recent Labs  Lab 08/22/23 1245  ALBUMIN 2.6*   No results for input(s): LIPASE, AMYLASE in the last 168 hours. No results for input(s): AMMONIA in the last 168 hours.  CBC: Recent Labs  Lab 08/21/23 2009 08/22/23 1245 08/23/23 0404 08/24/23 0500 08/25/23 0737 08/26/23 0502  WBC 13.0* 9.0 5.9 10.6* 8.1 8.8  NEUTROABS 9.4*  --   --   --   --   --   HGB 9.3* 7.8* 7.8* 7.4* 7.2* 8.8*  HCT 30.5* 26.8* 25.7* 23.8* 24.0* 27.6*  MCV 90.5 91.8 88.9 87.2 88.9 86.5  PLT 226 201 189 203 197 199    Cardiac Enzymes: No results for input(s): CKTOTAL, CKMB, CKMBINDEX, TROPONINI in the last 168 hours.  BNP: Invalid input(s): POCBNP  CBG: Recent Labs  Lab 08/25/23 1200 08/25/23 1634 08/25/23 2129 08/26/23 0849 08/26/23 1148  GLUCAP 128* 211* 217* 117* 165*    Microbiology: Results for orders placed or performed during the hospital encounter of 08/21/23  Resp panel by RT-PCR (RSV, Flu A&B, Covid) Anterior Nasal Swab     Status: None   Collection Time: 08/21/23  8:09 PM   Specimen: Anterior Nasal Swab  Result Value Ref Range Status   SARS Coronavirus 2 by  RT PCR NEGATIVE NEGATIVE Final    Comment: (NOTE) SARS-CoV-2 target nucleic acids are NOT DETECTED.  The SARS-CoV-2 RNA is generally detectable in upper respiratory specimens during the acute phase of infection. The lowest concentration of SARS-CoV-2 viral copies this assay can detect is 138 copies/mL. A negative result does not preclude SARS-Cov-2 infection and should not be used as the sole basis for treatment or other patient management decisions. A negative result may occur with  improper specimen collection/handling, submission of specimen other than nasopharyngeal swab, presence of viral mutation(s) within the areas  targeted by this assay, and inadequate number of viral copies(<138 copies/mL). A negative result must be combined with clinical observations, patient history, and epidemiological information. The expected result is Negative.  Fact Sheet for Patients:  BloggerCourse.com  Fact Sheet for Healthcare Providers:  SeriousBroker.it  This test is no t yet approved or cleared by the [place]  FDA and  has been authorized for detection and/or diagnosis of SARS-CoV-2 by FDA under an Emergency Use Authorization (EUA). This EUA will remain  in effect (meaning this test can be used) for the duration of the COVID-19 declaration under Section 564(b)(1) of the Act, 21 U.S.C.section 360bbb-3(b)(1), unless the authorization is terminated  or revoked sooner.       Influenza A by PCR NEGATIVE NEGATIVE Final   Influenza B by PCR NEGATIVE NEGATIVE Final    Comment: (NOTE) The Xpert Xpress SARS-CoV-2/FLU/RSV plus assay is intended as an aid in the diagnosis of influenza from Nasopharyngeal swab specimens and should not be used as a sole basis for treatment. Nasal washings and aspirates are unacceptable for Xpert Xpress SARS-CoV-2/FLU/RSV testing.  Fact Sheet for Patients: BloggerCourse.com  Fact Sheet for Healthcare Providers: SeriousBroker.it  This test is not yet approved or cleared by the [place]  FDA and has been authorized for detection and/or diagnosis of SARS-CoV-2 by FDA under an Emergency Use Authorization (EUA). This EUA will remain in effect (meaning this test can be used) for the duration of the COVID-19 declaration under Section 564(b)(1) of the Act, 21 U.S.C. section 360bbb-3(b)(1), unless the authorization is terminated or revoked.     Resp Syncytial Virus by PCR NEGATIVE NEGATIVE Final    Comment: (NOTE) Fact Sheet for  Patients: BloggerCourse.com  Fact Sheet for Healthcare Providers: SeriousBroker.it  This test is not yet approved or cleared by the [place]  FDA and has been authorized for detection and/or diagnosis of SARS-CoV-2 by FDA under an Emergency Use Authorization (EUA). This EUA will remain in effect (meaning this test can be used) for the duration of the COVID-19 declaration under Section 564(b)(1) of the Act, 21 U.S.C. section 360bbb-3(b)(1), unless the authorization is terminated or revoked.  Performed at [facility], [address] [place], [place] [address]   MRSA Next Gen by PCR, Nasal     Status: None   Collection Time: 08/23/23  7:55 AM   Specimen: Nasal Mucosa; Nasal Swab  Result Value Ref Range Status   MRSA by PCR Next Gen NOT DETECTED NOT DETECTED Final    Comment: (NOTE) The GeneXpert MRSA Assay (FDA approved for NASAL specimens only), is one component of a comprehensive MRSA colonization surveillance program. It is not intended to diagnose MRSA infection nor to guide or monitor treatment for MRSA infections. Test performance is not FDA approved in patients less than 72 years old. Performed at [facility], [address] [place], [place] [address]   Expectorated Sputum Assessment w Gram Stain, Rflx to Resp  Cult     Status: None   Collection Time: 08/23/23  7:56 AM   Specimen: Sputum  Result Value Ref Range Status   Specimen Description SPUTUM  Final   Special Requests NONE  Final   Sputum evaluation   Final    THIS SPECIMEN IS ACCEPTABLE FOR SPUTUM CULTURE Performed at [facility], [address], [place], [place] [address]    Report Status 08/23/2023 FINAL  Final  Culture, Respiratory w Gram Stain     Status: None   Collection Time: 08/23/23  7:56 AM   Specimen: SPU  Result Value Ref Range Status   Specimen Description   Final    SPUTUM Performed at [facility],  [address], [place], [place] [address]    Special Requests   Final    NONE Reflexed from [id number] Performed at [facility], [address] [place], [place] [address]    Gram Stain   Final    RARE SQUAMOUS EPITHELIAL CELLS PRESENT WBC PRESENT, PREDOMINANTLY PMN RARE GRAM POSITIVE COCCI    Culture   Final    Normal respiratory flora-no Staph aureus or Pseudomonas seen Performed at [facility], [address] [place], [place] [address]    Report Status 08/25/2023 FINAL  Final    Coagulation Studies: No results for input(s): LABPROT, INR in the last 72 hours.   Urinalysis: No results for input(s): COLORURINE, LABSPEC, PHURINE, GLUCOSEU, HGBUR, BILIRUBINUR, KETONESUR, PROTEINUR, UROBILINOGEN, NITRITE, LEUKOCYTESUR in the last 72 hours.  Invalid input(s): APPERANCEUR    Imaging: No results found.    Medications:      aspirin  EC  81 mg Oral Daily   budesonide -glycopyrrolate -formoterol   2 puff Inhalation BID   Chlorhexidine  Gluconate Cloth  6 each Topical Q0600   donepezil   10 mg Oral QHS   ferrous sulfate   325 mg Oral Q breakfast   guaiFENesin   600 mg Oral BID   heparin   5,000 Units Subcutaneous Q8H   hydrALAZINE   100 mg Oral BID   insulin  aspart  0-9 Units Subcutaneous TID WC   pantoprazole   40 mg Oral Daily   QUEtiapine   50 mg Oral QPM   Vitamin D  (Ergocalciferol )  50,000 Units Oral Q7 days   acetaminophen  **OR** acetaminophen , guaiFENesin -dextromethorphan , HYDROmorphone , ipratropium-albuterol , ondansetron  **OR** ondansetron  (ZOFRAN ) IV  Assessment/ Plan:  Jennifer Carrillo is a 89 y.o.  female medical history significant of ESRD on HD TTS, HTN, PVD s/p multiple stenting procedures on Plavix , multivessel CAD with known RCA occlusion with collaterals, IDDM, chronic iron  deficiency anemia no longer on iron  supplement, COPD with chronic hypoxic respiratory failure on 2 L nightly, dementia, sent from dialysis center for  evaluation of Sepsis due to pneumonia (HCC) [J18.9, A41.9] CAP (community acquired pneumonia) [J18.9]  [facility]/TTS/Rt permcath  Sepsis likely secondary to pneumonia. CT angio chest concerning for multifocal pneumonia.  Currently receiving azithromycin  and ceftriaxone .  Weaned to room air.  2. End stage renal disease on hemodialysis.  Scheduled to receive dialysis tomorrow.  3. Anemia of chronic kidney disease Lab Results  Component Value Date   HGB 8.8 (L) 08/26/2023    Hgb 8.8, patient receives mircera at outpatient clinic.    4. Secondary Hyperparathyroidism: with outpatient labs:unavailble Lab Results  Component Value Date   PTH 35 06/27/2018   CALCIUM  8.3 (L) 08/26/2023   PHOS 3.9 08/25/2023    Calcium  and phosphorus in optimal range.  Will continue to monitor bone minerals.   LOS:  4 Lydell Moga 8/18/20252:24 PM

## 2023-08-27 ENCOUNTER — Other Ambulatory Visit: Payer: Self-pay

## 2023-08-27 DIAGNOSIS — J189 Pneumonia, unspecified organism: Secondary | ICD-10-CM | POA: Diagnosis not present

## 2023-08-27 DIAGNOSIS — A419 Sepsis, unspecified organism: Secondary | ICD-10-CM | POA: Diagnosis not present

## 2023-08-27 LAB — CBC
HCT: 27.4 % — ABNORMAL LOW (ref 36.0–46.0)
Hemoglobin: 8.8 g/dL — ABNORMAL LOW (ref 12.0–15.0)
MCH: 27.5 pg (ref 26.0–34.0)
MCHC: 32.1 g/dL (ref 30.0–36.0)
MCV: 85.6 fL (ref 80.0–100.0)
Platelets: 208 K/uL (ref 150–400)
RBC: 3.2 MIL/uL — ABNORMAL LOW (ref 3.87–5.11)
RDW: 17.5 % — ABNORMAL HIGH (ref 11.5–15.5)
WBC: 7.9 K/uL (ref 4.0–10.5)
nRBC: 0 % (ref 0.0–0.2)

## 2023-08-27 LAB — BASIC METABOLIC PANEL WITH GFR
Anion gap: 11 (ref 5–15)
BUN: 70 mg/dL — ABNORMAL HIGH (ref 8–23)
CO2: 23 mmol/L (ref 22–32)
Calcium: 8.4 mg/dL — ABNORMAL LOW (ref 8.9–10.3)
Chloride: 96 mmol/L — ABNORMAL LOW (ref 98–111)
Creatinine, Ser: 6.08 mg/dL — ABNORMAL HIGH (ref 0.44–1.00)
GFR, Estimated: 6 mL/min — ABNORMAL LOW (ref >60)
Glucose, Bld: 151 mg/dL — ABNORMAL HIGH (ref 70–99)
Potassium: 4.2 mmol/L (ref 3.5–5.1)
Sodium: 130 mmol/L — ABNORMAL LOW (ref 135–145)

## 2023-08-27 LAB — GLUCOSE, CAPILLARY
Glucose-Capillary: 126 mg/dL — ABNORMAL HIGH (ref 70–99)
Glucose-Capillary: 137 mg/dL — ABNORMAL HIGH (ref 70–99)

## 2023-08-27 LAB — HEPATITIS B SURFACE ANTIGEN: Hepatitis B Surface Ag: NONREACTIVE

## 2023-08-27 MED ORDER — HEPARIN SODIUM (PORCINE) 1000 UNIT/ML IJ SOLN
INTRAMUSCULAR | Status: AC
Start: 2023-08-27 — End: 2023-08-27
  Filled 2023-08-27: qty 4

## 2023-08-27 MED ORDER — ALTEPLASE 2 MG IJ SOLR: 2.0000 mg | Freq: Once | INTRAMUSCULAR | Status: DC | PRN

## 2023-08-27 MED ORDER — HEPARIN SODIUM (PORCINE) 1000 UNIT/ML DIALYSIS
1000.0000 [IU] | INTRAMUSCULAR | Status: DC | PRN
  Administered 2023-08-27: 3300 [IU]

## 2023-08-27 MED ORDER — AMLODIPINE BESYLATE 5 MG PO TABS
5.0000 mg | ORAL_TABLET | Freq: Every day | ORAL | Status: DC
  Administered 2023-08-27: 5 mg via ORAL
  Filled 2023-08-27 (×2): qty 1

## 2023-08-27 MED ORDER — AMLODIPINE BESYLATE 5 MG PO TABS
ORAL_TABLET | ORAL | 11 refills | Status: AC
  Filled 2023-08-27: qty 30, 30d supply, fill #0

## 2023-08-27 NOTE — TOC Transition Note (Signed)
 Transition of Care [facility]) - Discharge Note   Patient Details  Name: Jennifer Carrillo MRN: [id number] Date of Birth: 10-23-1933  Transition of Care [facility]) CM/SW Contact:  Corean ONEIDA Haddock, RN Phone Number: 08/27/2023, 10:59 AM   Clinical Narrative:     Spouse deferred decision of home health to daughter [name].  [name] is in agreement and would like to use [place] home health.  Referral made to [facility] with [facility].  [name] states that patient already has BSC in the home.  Spouse and sons to transport at discharge         Patient Goals and CMS Choice            Discharge Placement                       Discharge Plan and Services Additional resources added to the After Visit Summary for                                       Social Drivers of Health (SDOH) Interventions SDOH Screenings   Food Insecurity: No Food Insecurity (08/22/2023)  Recent Concern: Food Insecurity - Food Insecurity Present (07/17/2023)   Received from [facility]  Housing: Low Risk  (08/22/2023)  Transportation Needs: No Transportation Needs (08/22/2023)  Recent Concern: Transportation Needs - Unmet Transportation Needs (07/17/2023)   Received from [facility]  Utilities: Not At Risk (08/22/2023)  Alcohol Screen: Low Risk  (06/11/2018)  Depression (PHQ2-9): Low Risk  (08/16/2023)  Financial Resource Strain: High Risk (07/17/2023)   Received from [facility]  Social Connections: Moderately Isolated (08/22/2023)  Tobacco Use: Medium Risk (08/21/2023)  Health Literacy: Low Risk  (04/17/2020)   Received from [facility]     Readmission Risk Interventions    12/19/2020    1:33 PM  Readmission Risk Prevention Plan  Transportation Screening Complete  Medication Review (RN Care Manager) Complete  PCP or Specialist appointment within 3-5 days of discharge Complete  HRI or Home Care Consult Complete  SW Recovery Care/Counseling Consult Complete   Palliative Care Screening Not Applicable  Skilled Nursing Facility Complete

## 2023-08-27 NOTE — Progress Notes (Signed)
 [facility]  ROUNDING NOTE   Subjective:   Jennifer Carrillo 89 y.o. female with medical history significant of ESRD on HD TTS, HTN, PVD s/p multiple stenting procedures on Plavix , multivessel CAD with known RCA occlusion with collaterals, IDDM, chronic iron  deficiency anemia no longer on iron  supplement, COPD with chronic hypoxic respiratory failure on 2 L nightly, dementia, sent from dialysis center for evaluation of Sepsis due to pneumonia (HCC) [J18.9, A41.9] CAP (community acquired pneumonia) [J18.9]  Patient receives outpatient dialysis treatments at [facility] on a TTS schedule, supervised by [facility] physicians.    Patient seen and evaluated during dialysis   HEMODIALYSIS FLOWSHEET:  Blood Flow Rate (mL/min): 400 mL/min Arterial Pressure (mmHg): -190 mmHg Venous Pressure (mmHg): 150 mmHg TMP (mmHg): 2 mmHg Ultrafiltration Rate (mL/min): 628 mL/min Dialysate Flow Rate (mL/min): 300 ml/min Dialysis Fluid Bolus: Normal Saline Bolus Amount (mL): 300 mL  Tolerating treatment well   Objective:  Vital signs in last 24 hours:  Temp:  [97.8 F (36.6 C)-98.4 F (36.9 C)] 98 F (36.7 C) (08/19 0808) Pulse Rate:  [56-94] 94 (08/19 1134) Resp:  [12-28] 28 (08/19 1134) BP: (116-164)/(46-84) 116/49 (08/19 1131) SpO2:  [94 %-100 %] 99 % (08/19 1134) Weight:  [78 kg] 78 kg (08/19 0808)  Weight change:  Filed Weights   08/24/23 1433 08/24/23 1757 08/27/23 0808  Weight: 73.2 kg 72.7 kg 78 kg    Intake/Output: I/O last 3 completed shifts: In: 680 [P.O.:580; IV Piggyback:100] Out: -    Intake/Output this shift:  No intake/output data recorded.  Physical Exam: General: NAD  Head: Normocephalic, atraumatic. Moist oral mucosal membranes  Eyes: Anicteric  Lungs:  Mild wheeze, room air  Heart: Regular rate and rhythm  Abdomen:  Soft, nontender  Extremities:  No peripheral edema.  Neurologic: Awake, alert, conversant  Skin: Warm,dry, no rash  Access: Rt permcath     Basic Metabolic Panel: Recent Labs  Lab 08/22/23 1245 08/23/23 0404 08/24/23 0500 08/25/23 0737 08/26/23 0502 08/27/23 0424  NA 141 139 135 135 134* 130*  K 3.2* 3.6 3.8 3.8 4.1 4.2  CL 101 102 99 97* 96* 96*  CO2 23 25 25 27 25 23   GLUCOSE 69* 153* 220* 165* 173* 151*  BUN 46* 26* 51* 32* 54* 70*  CREATININE 6.60* 3.77* 5.48* 3.66* 4.97* 6.08*  CALCIUM  8.1* 8.4* 8.5* 8.2* 8.3* 8.4*  MG 2.2 2.2 2.5* 2.3  --   --   PHOS 4.8*  4.9* 4.6 6.5* 3.9  --   --     Liver Function Tests: Recent Labs  Lab 08/22/23 1245  ALBUMIN 2.6*   No results for input(s): LIPASE, AMYLASE in the last 168 hours. No results for input(s): AMMONIA in the last 168 hours.  CBC: Recent Labs  Lab 08/21/23 2009 08/22/23 1245 08/23/23 0404 08/24/23 0500 08/25/23 0737 08/26/23 0502 08/27/23 0424  WBC 13.0*   < > 5.9 10.6* 8.1 8.8 7.9  NEUTROABS 9.4*  --   --   --   --   --   --   HGB 9.3*   < > 7.8* 7.4* 7.2* 8.8* 8.8*  HCT 30.5*   < > 25.7* 23.8* 24.0* 27.6* 27.4*  MCV 90.5   < > 88.9 87.2 88.9 86.5 85.6  PLT 226   < > 189 203 197 199 208   < > = values in this interval not displayed.    Cardiac Enzymes: No results for input(s): CKTOTAL, CKMB, CKMBINDEX, TROPONINI in the  last 168 hours.  BNP: Invalid input(s): POCBNP  CBG: Recent Labs  Lab 08/26/23 0849 08/26/23 1148 08/26/23 1606 08/26/23 2108 08/27/23 0722  GLUCAP 117* 165* 174* 141* 126*    Microbiology: Results for orders placed or performed during the hospital encounter of 08/21/23  Resp panel by RT-PCR (RSV, Flu A&B, Covid) Anterior Nasal Swab     Status: None   Collection Time: 08/21/23  8:09 PM   Specimen: Anterior Nasal Swab  Result Value Ref Range Status   SARS Coronavirus 2 by RT PCR NEGATIVE NEGATIVE Final    Comment: (NOTE) SARS-CoV-2 target nucleic acids are NOT DETECTED.  The SARS-CoV-2 RNA is generally detectable in upper respiratory specimens during the acute phase of infection. The  lowest concentration of SARS-CoV-2 viral copies this assay can detect is 138 copies/mL. A negative result does not preclude SARS-Cov-2 infection and should not be used as the sole basis for treatment or other patient management decisions. A negative result may occur with  improper specimen collection/handling, submission of specimen other than nasopharyngeal swab, presence of viral mutation(s) within the areas targeted by this assay, and inadequate number of viral copies(<138 copies/mL). A negative result must be combined with clinical observations, patient history, and epidemiological information. The expected result is Negative.  Fact Sheet for Patients:  BloggerCourse.com  Fact Sheet for Healthcare Providers:  SeriousBroker.it  This test is no t yet approved or cleared by the [place]  FDA and  has been authorized for detection and/or diagnosis of SARS-CoV-2 by FDA under an Emergency Use Authorization (EUA). This EUA will remain  in effect (meaning this test can be used) for the duration of the COVID-19 declaration under Section 564(b)(1) of the Act, 21 U.S.C.section 360bbb-3(b)(1), unless the authorization is terminated  or revoked sooner.       Influenza A by PCR NEGATIVE NEGATIVE Final   Influenza B by PCR NEGATIVE NEGATIVE Final    Comment: (NOTE) The Xpert Xpress SARS-CoV-2/FLU/RSV plus assay is intended as an aid in the diagnosis of influenza from Nasopharyngeal swab specimens and should not be used as a sole basis for treatment. Nasal washings and aspirates are unacceptable for Xpert Xpress SARS-CoV-2/FLU/RSV testing.  Fact Sheet for Patients: BloggerCourse.com  Fact Sheet for Healthcare Providers: SeriousBroker.it  This test is not yet approved or cleared by the [place]  FDA and has been authorized for detection and/or diagnosis of SARS-CoV-2 by FDA under  an Emergency Use Authorization (EUA). This EUA will remain in effect (meaning this test can be used) for the duration of the COVID-19 declaration under Section 564(b)(1) of the Act, 21 U.S.C. section 360bbb-3(b)(1), unless the authorization is terminated or revoked.     Resp Syncytial Virus by PCR NEGATIVE NEGATIVE Final    Comment: (NOTE) Fact Sheet for Patients: BloggerCourse.com  Fact Sheet for Healthcare Providers: SeriousBroker.it  This test is not yet approved or cleared by the [place]  FDA and has been authorized for detection and/or diagnosis of SARS-CoV-2 by FDA under an Emergency Use Authorization (EUA). This EUA will remain in effect (meaning this test can be used) for the duration of the COVID-19 declaration under Section 564(b)(1) of the Act, 21 U.S.C. section 360bbb-3(b)(1), unless the authorization is terminated or revoked.  Performed at [facility], [address] [place], [place] [address]   MRSA Next Gen by PCR, Nasal     Status: None   Collection Time: 08/23/23  7:55 AM   Specimen: Nasal Mucosa; Nasal Swab  Result Value Ref Range  Status   MRSA by PCR Next Gen NOT DETECTED NOT DETECTED Final    Comment: (NOTE) The GeneXpert MRSA Assay (FDA approved for NASAL specimens only), is one component of a comprehensive MRSA colonization surveillance program. It is not intended to diagnose MRSA infection nor to guide or monitor treatment for MRSA infections. Test performance is not FDA approved in patients less than 99 years old. Performed at [facility], [address] [place], [place] [address]   Expectorated Sputum Assessment w Gram Stain, Rflx to Resp Cult     Status: None   Collection Time: 08/23/23  7:56 AM   Specimen: Sputum  Result Value Ref Range Status   Specimen Description SPUTUM  Final   Special Requests NONE  Final   Sputum evaluation   Final    THIS SPECIMEN IS  ACCEPTABLE FOR SPUTUM CULTURE Performed at [facility], [address] [place], [place] [address]    Report Status 08/23/2023 FINAL  Final  Culture, Respiratory w Gram Stain     Status: None   Collection Time: 08/23/23  7:56 AM   Specimen: SPU  Result Value Ref Range Status   Specimen Description   Final    SPUTUM Performed at [facility], [address], [place], [place] [address]    Special Requests   Final    NONE Reflexed from [id number] Performed at [facility], [address] [place], [place] [address]    Gram Stain   Final    RARE SQUAMOUS EPITHELIAL CELLS PRESENT WBC PRESENT, PREDOMINANTLY PMN RARE GRAM POSITIVE COCCI    Culture   Final    Normal respiratory flora-no Staph aureus or Pseudomonas seen Performed at [facility], [address], [place], [place] [address]    Report Status 08/25/2023 FINAL  Final    Coagulation Studies: No results for input(s): LABPROT, INR in the last 72 hours.   Urinalysis: No results for input(s): COLORURINE, LABSPEC, PHURINE, GLUCOSEU, HGBUR, BILIRUBINUR, KETONESUR, PROTEINUR, UROBILINOGEN, NITRITE, LEUKOCYTESUR in the last 72 hours.  Invalid input(s): APPERANCEUR    Imaging: No results found.    Medications:      amLODipine   5 mg Oral Daily   aspirin  EC  81 mg Oral Daily   budesonide -glycopyrrolate -formoterol   2 puff Inhalation BID   Chlorhexidine  Gluconate Cloth  6 each Topical Q0600   donepezil   10 mg Oral QHS   ferrous sulfate   325 mg Oral Q breakfast   guaiFENesin   600 mg Oral BID   heparin   5,000 Units Subcutaneous Q8H   hydrALAZINE   100 mg Oral BID   insulin  aspart  0-9 Units Subcutaneous TID WC   pantoprazole   40 mg Oral Daily   QUEtiapine   50 mg Oral QPM   Vitamin D  (Ergocalciferol )  50,000 Units Oral Q7 days   acetaminophen  **OR** acetaminophen , alteplase , guaiFENesin -dextromethorphan , heparin , HYDROmorphone , ipratropium-albuterol ,  ondansetron  **OR** ondansetron  (ZOFRAN ) IV  Assessment/ Plan:  Jennifer Carrillo is a 89 y.o.  female medical history significant of ESRD on HD TTS, HTN, PVD s/p multiple stenting procedures on Plavix , multivessel CAD with known RCA occlusion with collaterals, IDDM, chronic iron  deficiency anemia no longer on iron  supplement, COPD with chronic hypoxic respiratory failure on 2 L nightly, dementia, sent from dialysis center for evaluation of Sepsis due to pneumonia (HCC) [J18.9, A41.9] CAP (community acquired pneumonia) [J18.9]  [facility]/TTS/Rt permcath  Sepsis likely secondary to pneumonia. CT angio chest concerning for multifocal pneumonia.  Completed azithromycin  and  ceftriaxone .    2. End stage renal disease on hemodialysis.  Receiving dialysis today, UF goal 1.5L as tolerated. Next treatment scheduled for Thursday.   3. Anemia of chronic kidney disease Lab Results  Component Value Date   HGB 8.8 (L) 08/27/2023    Hgb 8.8, patient receives mircera at outpatient clinic.    4. Secondary Hyperparathyroidism: with outpatient labs:unavailble Lab Results  Component Value Date   PTH 35 06/27/2018   CALCIUM  8.4 (L) 08/27/2023   PHOS 3.9 08/25/2023    Will continue to monitor bone minerals.   LOS: 5 Winter Jocelyn 8/19/202511:35 AM

## 2023-08-27 NOTE — Plan of Care (Signed)
 Problem: Fluid Volume: Goal: Hemodynamic stability will improve Outcome: Progressing   Problem: Clinical Measurements: Goal: Diagnostic test results will improve Outcome: Progressing Goal: Signs and symptoms of infection will decrease Outcome: Progressing   Problem: Respiratory: Goal: Ability to maintain adequate ventilation will improve Outcome: Progressing   Problem: Education: Goal: Knowledge of General Education information will improve Description: Including pain rating scale, medication(s)/side effects and non-pharmacologic comfort measures Outcome: Progressing   Problem: Health Behavior/Discharge Planning: Goal: Ability to manage health-related needs will improve Outcome: Progressing   Problem: Clinical Measurements: Goal: Ability to maintain clinical measurements within normal limits will improve Outcome: Progressing Goal: Will remain free from infection Outcome: Progressing Goal: Diagnostic test results will improve Outcome: Progressing Goal: Respiratory complications will improve Outcome: Progressing Goal: Cardiovascular complication will be avoided Outcome: Progressing   Problem: Activity: Goal: Risk for activity intolerance will decrease Outcome: Progressing   Problem: Nutrition: Goal: Adequate nutrition will be maintained Outcome: Progressing   Problem: Coping: Goal: Level of anxiety will decrease Outcome: Progressing   Problem: Elimination: Goal: Will not experience complications related to bowel motility Outcome: Progressing Goal: Will not experience complications related to urinary retention Outcome: Progressing   Problem: Pain Managment: Goal: General experience of comfort will improve and/or be controlled Outcome: Progressing   Problem: Safety: Goal: Ability to remain free from injury will improve Outcome: Progressing   Problem: Skin Integrity: Goal: Risk for impaired skin integrity will decrease Outcome: Progressing   Problem:  Education: Goal: Ability to describe self-care measures that may prevent or decrease complications (Diabetes Survival Skills Education) will improve Outcome: Progressing Goal: Individualized Educational Video(s) Outcome: Progressing   Problem: Coping: Goal: Ability to adjust to condition or change in health will improve Outcome: Progressing   Problem: Fluid Volume: Goal: Ability to maintain a balanced intake and output will improve Outcome: Progressing   Problem: Health Behavior/Discharge Planning: Goal: Ability to identify and utilize available resources and services will improve Outcome: Progressing Goal: Ability to manage health-related needs will improve Outcome: Progressing   Problem: Metabolic: Goal: Ability to maintain appropriate glucose levels will improve Outcome: Progressing   Problem: Nutritional: Goal: Maintenance of adequate nutrition will improve Outcome: Progressing Goal: Progress toward achieving an optimal weight will improve Outcome: Progressing   Problem: Skin Integrity: Goal: Risk for impaired skin integrity will decrease Outcome: Progressing   Problem: Tissue Perfusion: Goal: Adequacy of tissue perfusion will improve Outcome: Progressing   Problem: Education: Goal: Knowledge of General Education information will improve Description: Including pain rating scale, medication(s)/side effects and non-pharmacologic comfort measures Outcome: Progressing   Problem: Health Behavior/Discharge Planning: Goal: Ability to manage health-related needs will improve Outcome: Progressing   Problem: Clinical Measurements: Goal: Ability to maintain clinical measurements within normal limits will improve Outcome: Progressing Goal: Will remain free from infection Outcome: Progressing Goal: Diagnostic test results will improve Outcome: Progressing Goal: Respiratory complications will improve Outcome: Progressing Goal: Cardiovascular complication will be  avoided Outcome: Progressing   Problem: Activity: Goal: Risk for activity intolerance will decrease Outcome: Progressing   Problem: Nutrition: Goal: Adequate nutrition will be maintained Outcome: Progressing   Problem: Coping: Goal: Level of anxiety will decrease Outcome: Progressing   Problem: Elimination: Goal: Will not experience complications related to bowel motility Outcome: Progressing Goal: Will not experience complications related to urinary retention Outcome: Progressing   Problem: Pain Managment: Goal: General experience of comfort will improve and/or be controlled Outcome: Progressing   Problem: Safety: Goal: Ability to remain free from injury will improve Outcome: Progressing  Problem: Skin Integrity: Goal: Risk for impaired skin integrity will decrease Outcome: Progressing

## 2023-08-27 NOTE — Discharge Summary (Signed)
 [facility] Discharge Summary   Patient: Jennifer Carrillo FMW:[id number]  PCP: Kristina Tinnie POUR, PA-C  Date of admission: 08/21/2023   Date of discharge:  08/27/2023     Discharge Diagnoses:  Principal Problem:   Sepsis due to pneumonia [facility]) Active Problems:   COPD (chronic obstructive pulmonary disease) (HCC)   Dementia (HCC)   PAD (HCC)   Coronary artery disease involving native coronary artery of native heart   ESRD on hemodialysis (HCC)   Presence of other vascular implants and grafts   AVM (arteriovenous malformation) of duodenum, acquired   Bilateral carotid artery stenosis   Anemia of chronic kidney failure, stage 5 (HCC)   History of GI bleed   CAP (community acquired pneumonia)   Admitted From: Home Disposition:  Home with [facility] services  Recommendations for Outpatient Follow-up:  Follow-up with PCP in 1 week, continue monitor BP at home and follow with PCP to titrate medication accordingly.  Blood pressure was soft, so holding antihypertensive medications for now.  Can be resumed if blood pressure remains high Repeat CBC and BMP in 1 to 2 weeks Repeat chest x-ray after 4 weeks for resolution of pneumonia.  Repeat iron  profile and vitamin D  level after 3 to 6 months.  Continue supplements Follow-up with nephrology and continue hemodialysis TTS schedule. Follow up LABS/TEST:  As above    Follow-up Information     McDonough, Lauren K, PA-C Follow up in 1 week(s).   Specialty: Physician Assistant Contact information: [address] [place] [address] [phone]                Diet recommendation: Renal  Activity: The patient is advised to gradually reintroduce usual activities, as tolerated  Discharge Condition: stable  Code Status: Full code   History of present illness: As per the H and P dictated on admission.  Hospital Course:  Jennifer Carrillo is a 89 y.o. female with medical history significant for HTN, ESRD on HD TTS, CAD,  PVD/carotid artery disease s/p stenting, COPD on home O2 2 L, dementia, upper GI bleed from gastric AVM (05/2023), being admitted with sepsis secondary to multifocal pneumonia after presenting with 2-day history of cough and congestion and now weakness and confusion.  She has had no chest pain, fever or chills. In the ED, Tmax 99.1 and tachycardic to the 130s and 140s, tachypneic to 28, O2 sats near 100% on 2 L Labs notable for WBC 13,000 with lactic acid 1.2 and negative respiratory viral panel. Troponin 35 and BNP 2260 Hemoglobin at baseline at 9.3 Potassium 3.3.  Renal function reflecting dialysis status EKG showed sinus tachycardia with RBBB, T wave inversion in lateral leads. CTA chest negative for PE with findings concerning for multifocal patchy airspace disease worrisome for multifocal pneumonia. Patient started on Rocephin  and azithromycin .  Initiated on sepsis fluids Admission requested      Assessment and Plan:   # Sepsis due to pneumonia  Sepsis criteria include fever, tachycardia and tachypnea with leukocytosis, altered mental status  History of affected contacts per respiratory viral panel negative S/p Rocephin  x 5 days and azithromycin  x 3 days Received an initial fluid bolus, no more further fluid due to dialysis status.  Vital signs remained stable. S/p antitussives, DuoNebs prn SLP consult and aspiration precaution 8/14 Solu-Medrol  IV 80 mg x 1 dose followed by 40 mg IV twice daily followed by prednisone  40 mg p.o. daily for 3 days Continue Breztri  inhaler. S/p spirometry, flutter valve Sputum culture growing rare  gram-positive cocci Clinically patient is stable, respiratory symptoms resolved very minimal productive cough.  Patient does not need more antibiotics.  Recommended to follow with PCP to repeat chest x-ray after 4 weeks for resolution of pneumonia.  Dementia (HCC) Mild delirium/acute toxic metabolic encephalopathy Patient presented with confusion in the setting  of acute infection Neurologic checks, fall and aspiration precautions Continue Aricept    COPD exacerbation Chronic respiratory failure with hypoxia Acute exacerbation not suspected, s/p prn DuoNebs Supplemental oxygen weaned off currently saturating well on room air. S/p steroids as above    ESRD on hemodialysis [facility]) Nephrology consult for continuation of dialysis No missed or incomplete dialysis sessions HD TTS schedule    Coronary artery disease involving native coronary artery of native heart Elevated troponin of 35, suspect demand No complaints of chest pain, EKG nonacute Continue aspirin , atorvastatin  and Imdur    PAD (HCC) History of stenting of carotid artery, femoral artery and renal artery No acute issues. Continue aspirin  and statin   AVM (arteriovenous malformation) of duodenum, acquired History of upper GI bleed 05/2023 (AVM) No acute issues suspected. Continue Protonix , ferrous sulfate    # Anemia of chronic kidney failure, stage 5 (HCC) Iron  deficiency, Tsat 10%, folic acid  within normal range. Continue oral iron  supplement 8/16 patient is poor historian, as per her husband she gets blood transfusion as an outpatient. 8/17 Hb 7.2, borderline low.  Patient agreeable to blood transfusion.  1 unit PRBC transfused. Hemoglobin 8.8 posttransfusion, remained stable.    # Vitamin D  insufficiency: started vitamin D  50,000 units p.o. weekly, follow with PCP to repeat vitamin D  level after 3 to 6 months.   # Sundowning, started Seroquel  50 mg p.o. every evening   Body mass index is 26.93 kg/m.  Nutrition Interventions:  - Patient was instructed, not to drive, operate heavy machinery, perform activities at heights, swimming or participation in water  activities or provide baby sitting services while on Pain, Sleep and Anxiety Medications; until her outpatient Physician has advised to do so again.  - Also recommended to not to take more than prescribed Pain, Sleep and  Anxiety Medications.  Patient was seen by physical therapy, who recommended Home health, which was arranged. On the day of the discharge the patient's vitals were stable, and no other acute medical condition were reported by patient. the patient was felt safe to be discharge at Home with Home health.  Consultants: Nephrology Procedures: Hemodialysis  Discharge Exam: General: Appear in no distress, no Rash; Oral Mucosa Clear, moist. Cardiovascular: S1 and S2 Present, no Murmur, Respiratory: normal respiratory effort, Bilateral Air entry present and no Crackles, no wheezes Abdomen: Bowel Sound present, Soft and no tenderness, no hernia Extremities: no Pedal edema, no calf tenderness Neurology: alert and oriented to time, place, and person affect appropriate.  Filed Weights   08/24/23 1433 08/24/23 1757 08/27/23 0808  Weight: 73.2 kg 72.7 kg 78 kg   Vitals:   08/27/23 1001 08/27/23 1031  BP: (!) 158/49 (!) 158/52  Pulse: 60 (!) 58  Resp: (!) 23 (!) 26  Temp:    SpO2: 100% 94%    DISCHARGE MEDICATION: Allergies as of 08/27/2023       Reactions   Benazepril Nausea Only   Statins Other (See Comments)   Myalgia swelling    Outside Source Comment: swelling        Medication List     STOP taking these medications    anastrozole  1 MG tablet Commonly known as: ARIMIDEX    aspirin  81  MG chewable tablet   atorvastatin  40 MG tablet Commonly known as: LIPITOR   clopidogrel  75 MG tablet Commonly known as: PLAVIX    docusate sodium  100 MG capsule Commonly known as: COLACE   isosorbide  mononitrate 60 MG 24 hr tablet Commonly known as: IMDUR    metoprolol  succinate 25 MG 24 hr tablet Commonly known as: TOPROL -XL   pantoprazole  40 MG tablet Commonly known as: Protonix        TAKE these medications    Accu-Chek Guide Me w/Device Kit Use as instructed. DX e11.65   Accu-Chek Guide test strip Generic drug: glucose blood TEST BLOOD SUGAR THREE TIMES DAILY  AS   DIRECTED   Accu-Chek Softclix Lancets lancets USE AS INSTRUCTED 3 TIMES A DAY   acetaminophen  500 MG tablet Commonly known as: TYLENOL  Take 1 tablet (500 mg total) by mouth every 6 (six) hours as needed for moderate pain or headache.   albuterol  108 (90 Base) MCG/ACT inhaler Commonly known as: VENTOLIN  HFA Inhale 2 puffs into the lungs every 6 (six) hours as needed for wheezing or shortness of breath.   amLODipine  5 MG tablet Commonly known as: NORVASC  Skip the dose if systolic BP less than 140 mmHg What changed:  medication strength additional instructions   B-D SINGLE USE SWABS REGULAR Pads Use as directed for 3 times daily DX E11.65   Breztri  Aerosphere 160-9-4.8 MCG/ACT Aero inhaler Generic drug: budesonide -glycopyrrolate -formoterol  Inhale 2 puffs into the lungs 2 (two) times daily.   donepezil  10 MG tablet Commonly known as: ARICEPT  TAKE 1 TABLET AT BEDTIME FOR MEMORY (NEED MD APPOINTMENT)   FeroSul 325 (65 Fe) MG tablet Generic drug: ferrous sulfate  Take 1 tablet (325 mg total) by mouth daily with breakfast.   guaiFENesin  600 MG 12 hr tablet Commonly known as: MUCINEX  Take 1 tablet (600 mg total) by mouth 2 (two) times daily for 5 days.   hydrALAZINE  100 MG tablet Commonly known as: APRESOLINE  Take 100 mg by mouth 2 (two) times daily.   QUEtiapine  50 MG tablet Commonly known as: SEROQUEL  Take 1 tablet (50 mg total) by mouth every evening.   Robafen DM 20-200 MG/20ML Liqd Generic drug: Dextromethorphan -guaiFENesin  Take 10 mLs by mouth every 6 (six) hours as needed.   torsemide 100 MG tablet Commonly known as: DEMADEX Take 100 mg by mouth daily.   Vitamin D  (Ergocalciferol ) 1.25 MG (50000 UNIT) Caps capsule Commonly known as: DRISDOL  Take 1 capsule (50,000 Units total) by mouth every 7 (seven) days. Start taking on: August 30, 2023       Allergies  Allergen Reactions   Benazepril Nausea Only   Statins Other (See Comments)    Myalgia  swelling     Outside Source Comment: swelling   Discharge Instructions     Call MD for:  difficulty breathing, headache or visual disturbances   Complete by: As directed    Call MD for:  extreme fatigue   Complete by: As directed    Call MD for:  persistant dizziness or light-headedness   Complete by: As directed    Call MD for:  persistant nausea and vomiting   Complete by: As directed    Call MD for:  redness, tenderness, or signs of infection (pain, swelling, redness, odor or green/yellow discharge around incision site)   Complete by: As directed    Call MD for:  severe uncontrolled pain   Complete by: As directed    Call MD for:  temperature >100.4   Complete by: As directed  Diet - low sodium heart healthy   Complete by: As directed    Discharge instructions   Complete by: As directed    Follow-up with PCP in 1 week, continue monitor BP at home and follow with PCP to titrate medication accordingly.  Blood pressure was soft, so holding antihypertensive medications for now.  Can be resumed if blood pressure remains high Repeat CBC and BMP in 1 to 2 weeks Repeat chest x-ray after 4 weeks for resolution of pneumonia.  Repeat iron  profile and vitamin D  level after 3 to 6 months.  Continue supplements Follow-up with nephrology and continue hemodialysis TTS schedule.   Increase activity slowly   Complete by: As directed        The results of significant diagnostics from this hospitalization (including imaging, microbiology, ancillary and laboratory) are listed below for reference.    Significant Diagnostic Studies: CT Angio Chest PE W/Cm &/Or Wo Cm Result Date: 08/21/2023 CLINICAL DATA:  High probability for PE. Upper respiratory infection. EXAM: CT ANGIOGRAPHY CHEST WITH CONTRAST TECHNIQUE: Multidetector CT imaging of the chest was performed using the standard protocol during bolus administration of intravenous contrast. Multiplanar CT image reconstructions and MIPs were obtained to  evaluate the vascular anatomy. RADIATION DOSE REDUCTION: This exam was performed according to the departmental dose-optimization program which includes automated exposure control, adjustment of the mA and/or kV according to patient size and/or use of iterative reconstruction technique. CONTRAST:  75mL OMNIPAQUE  IOHEXOL  350 MG/ML SOLN COMPARISON:  CT of the chest 10/28/2021. FINDINGS: Cardiovascular: Heart is enlarged. Aorta is normal in size. There is no pericardial effusion. There are atherosclerotic calcifications of the aorta and coronary arteries. There is adequate opacification of the pulmonary arteries to the segmental level. There is no pulmonary embolism identified. Right-sided central venous catheter tip ends in the superior portion of the right atrium. Mediastinum/Nodes: There are prominent, but nonenlarged AP window lymph nodes, paratracheal lymph nodes and right hilar lymph nodes. Visualized esophagus and thyroid  gland are within normal limits. Lungs/Pleura: Multifocal patchy airspace, ground-glass and tree-in-bud opacities are seen throughout both lungs sparing the lung apices. There is no pleural effusion or pneumothorax. There is central peribronchial wall and bilateral lower lobe peribronchial wall thickening. Upper Abdomen: No acute abnormality. There is thickening of the bilateral adrenal glands nonspecific and unchanged. Musculoskeletal: No chest wall abnormality. No acute or significant osseous findings. Review of the MIP images confirms the above findings. IMPRESSION: 1. No evidence for pulmonary embolism. 2. Multifocal patchy airspace, ground-glass and tree-in-bud opacities throughout both lungs worrisome for multifocal pneumonia. 3. Central peribronchial wall thickening compatible with bronchitis. 4. Cardiomegaly. 5. Aortic atherosclerosis. Aortic Atherosclerosis (ICD10-I70.0). Electronically Signed   By: Greig Pique M.D.   On: 08/21/2023 22:32   DG Chest 1 View Result Date:  08/21/2023 CLINICAL DATA:  10031 Cough 10031 EXAM: CHEST  1 VIEW COMPARISON:  May 25, 2023 FINDINGS: Diffuse bilateral perihilar interstitial opacities. Patchy alveolar opacities in the right mid and lower lung zones. No pleural effusion or pneumothorax. Right hemodialysis catheter terminates in the high right atrium. Mild cardiomegaly.Choose 3no acute fracture or destructive lesion. IMPRESSION: Bilateral perihilar interstitial opacities, which may represent atypical/viral infection or interstitial edema. Patchy alveolar opacities in the right mid and lower lung zones, worrisome for a superimposed bronchopneumonia. Electronically Signed   By: Rogelia Myers M.D.   On: 08/21/2023 20:30    Microbiology: Recent Results (from the past 240 hours)  Resp panel by RT-PCR (RSV, Flu A&B, Covid) Anterior Nasal Swab  Status: None   Collection Time: 08/21/23  8:09 PM   Specimen: Anterior Nasal Swab  Result Value Ref Range Status   SARS Coronavirus 2 by RT PCR NEGATIVE NEGATIVE Final    Comment: (NOTE) SARS-CoV-2 target nucleic acids are NOT DETECTED.  The SARS-CoV-2 RNA is generally detectable in upper respiratory specimens during the acute phase of infection. The lowest concentration of SARS-CoV-2 viral copies this assay can detect is 138 copies/mL. A negative result does not preclude SARS-Cov-2 infection and should not be used as the sole basis for treatment or other patient management decisions. A negative result may occur with  improper specimen collection/handling, submission of specimen other than nasopharyngeal swab, presence of viral mutation(s) within the areas targeted by this assay, and inadequate number of viral copies(<138 copies/mL). A negative result must be combined with clinical observations, patient history, and epidemiological information. The expected result is Negative.  Fact Sheet for Patients:  BloggerCourse.com  Fact Sheet for Healthcare Providers:   SeriousBroker.it  This test is no t yet approved or cleared by the [place]  FDA and  has been authorized for detection and/or diagnosis of SARS-CoV-2 by FDA under an Emergency Use Authorization (EUA). This EUA will remain  in effect (meaning this test can be used) for the duration of the COVID-19 declaration under Section 564(b)(1) of the Act, 21 U.S.C.section 360bbb-3(b)(1), unless the authorization is terminated  or revoked sooner.       Influenza A by PCR NEGATIVE NEGATIVE Final   Influenza B by PCR NEGATIVE NEGATIVE Final    Comment: (NOTE) The Xpert Xpress SARS-CoV-2/FLU/RSV plus assay is intended as an aid in the diagnosis of influenza from Nasopharyngeal swab specimens and should not be used as a sole basis for treatment. Nasal washings and aspirates are unacceptable for Xpert Xpress SARS-CoV-2/FLU/RSV testing.  Fact Sheet for Patients: BloggerCourse.com  Fact Sheet for Healthcare Providers: SeriousBroker.it  This test is not yet approved or cleared by the [place]  FDA and has been authorized for detection and/or diagnosis of SARS-CoV-2 by FDA under an Emergency Use Authorization (EUA). This EUA will remain in effect (meaning this test can be used) for the duration of the COVID-19 declaration under Section 564(b)(1) of the Act, 21 U.S.C. section 360bbb-3(b)(1), unless the authorization is terminated or revoked.     Resp Syncytial Virus by PCR NEGATIVE NEGATIVE Final    Comment: (NOTE) Fact Sheet for Patients: BloggerCourse.com  Fact Sheet for Healthcare Providers: SeriousBroker.it  This test is not yet approved or cleared by the [place]  FDA and has been authorized for detection and/or diagnosis of SARS-CoV-2 by FDA under an Emergency Use Authorization (EUA). This EUA will remain in effect (meaning this test can be used) for  the duration of the COVID-19 declaration under Section 564(b)(1) of the Act, 21 U.S.C. section 360bbb-3(b)(1), unless the authorization is terminated or revoked.  Performed at [facility], [address] [place], [place] [address]   MRSA Next Gen by PCR, Nasal     Status: None   Collection Time: 08/23/23  7:55 AM   Specimen: Nasal Mucosa; Nasal Swab  Result Value Ref Range Status   MRSA by PCR Next Gen NOT DETECTED NOT DETECTED Final    Comment: (NOTE) The GeneXpert MRSA Assay (FDA approved for NASAL specimens only), is one component of a comprehensive MRSA colonization surveillance program. It is not intended to diagnose MRSA infection nor to guide or monitor treatment for MRSA infections. Test performance is not FDA approved in patients  less than 11 years old. Performed at [facility], [address] [place], [place] [address]   Expectorated Sputum Assessment w Gram Stain, Rflx to Resp Cult     Status: None   Collection Time: 08/23/23  7:56 AM   Specimen: Sputum  Result Value Ref Range Status   Specimen Description SPUTUM  Final   Special Requests NONE  Final   Sputum evaluation   Final    THIS SPECIMEN IS ACCEPTABLE FOR SPUTUM CULTURE Performed at [facility], [address] [place], [place] [address]    Report Status 08/23/2023 FINAL  Final  Culture, Respiratory w Gram Stain     Status: None   Collection Time: 08/23/23  7:56 AM   Specimen: SPU  Result Value Ref Range Status   Specimen Description   Final    SPUTUM Performed at [facility], [address] [place], [place] [address]    Special Requests   Final    NONE Reflexed from [id number] Performed at [facility], [address] [place], [place] [address]    Gram Stain   Final    RARE SQUAMOUS EPITHELIAL CELLS PRESENT WBC PRESENT, PREDOMINANTLY PMN RARE GRAM POSITIVE COCCI    Culture   Final    Normal respiratory flora-no Staph aureus or Pseudomonas  seen Performed at [facility], [address], [place], [place] [address]    Report Status 08/25/2023 FINAL  Final     Labs: CBC: Recent Labs  Lab 08/21/23 2009 08/22/23 1245 08/23/23 0404 08/24/23 0500 08/25/23 0737 08/26/23 0502 08/27/23 0424  WBC 13.0*   < > 5.9 10.6* 8.1 8.8 7.9  NEUTROABS 9.4*  --   --   --   --   --   --   HGB 9.3*   < > 7.8* 7.4* 7.2* 8.8* 8.8*  HCT 30.5*   < > 25.7* 23.8* 24.0* 27.6* 27.4*  MCV 90.5   < > 88.9 87.2 88.9 86.5 85.6  PLT 226   < > 189 203 197 199 208   < > = values in this interval not displayed.   Basic Metabolic Panel: Recent Labs  Lab 08/22/23 1245 08/23/23 0404 08/24/23 0500 08/25/23 0737 08/26/23 0502 08/27/23 0424  NA 141 139 135 135 134* 130*  K 3.2* 3.6 3.8 3.8 4.1 4.2  CL 101 102 99 97* 96* 96*  CO2 23 25 25 27 25 23   GLUCOSE 69* 153* 220* 165* 173* 151*  BUN 46* 26* 51* 32* 54* 70*  CREATININE 6.60* 3.77* 5.48* 3.66* 4.97* 6.08*  CALCIUM  8.1* 8.4* 8.5* 8.2* 8.3* 8.4*  MG 2.2 2.2 2.5* 2.3  --   --   PHOS 4.8*  4.9* 4.6 6.5* 3.9  --   --    Liver Function Tests: Recent Labs  Lab 08/22/23 1245  ALBUMIN 2.6*   No results for input(s): LIPASE, AMYLASE in the last 168 hours. No results for input(s): AMMONIA in the last 168 hours. Cardiac Enzymes: No results for input(s): CKTOTAL, CKMB, CKMBINDEX, TROPONINI in the last 168 hours. BNP (last 3 results) Recent Labs    08/21/23 2009  BNP 2,260.1*   CBG: Recent Labs  Lab 08/26/23 0849 08/26/23 1148 08/26/23 1606 08/26/23 2108 08/27/23 0722  GLUCAP 117* 165* 174* 141* 126*    Time spent: 35 minutes  Signed:  Elvan Sor  [facility] 08/27/2023 10:56 AM

## 2023-08-28 LAB — HEPATITIS B SURFACE ANTIBODY, QUANTITATIVE: Hep B S AB Quant (Post): <3.5 m[IU]/mL — ABNORMAL LOW

## 2023-08-29 ENCOUNTER — Inpatient Hospital Stay

## 2023-08-29 ENCOUNTER — Ambulatory Visit

## 2023-08-29 VITALS — BP 137/48 | HR 75 | Temp 97.9°F | Resp 18

## 2023-08-29 DIAGNOSIS — D649 Anemia, unspecified: Secondary | ICD-10-CM

## 2023-08-29 DIAGNOSIS — L930 Discoid lupus erythematosus: Secondary | ICD-10-CM | POA: Diagnosis not present

## 2023-08-29 DIAGNOSIS — Z801 Family history of malignant neoplasm of trachea, bronchus and lung: Secondary | ICD-10-CM | POA: Diagnosis not present

## 2023-08-29 DIAGNOSIS — M1990 Unspecified osteoarthritis, unspecified site: Secondary | ICD-10-CM | POA: Diagnosis not present

## 2023-08-29 DIAGNOSIS — Z87891 Personal history of nicotine dependence: Secondary | ICD-10-CM | POA: Diagnosis not present

## 2023-08-29 DIAGNOSIS — D500 Iron deficiency anemia secondary to blood loss (chronic): Secondary | ICD-10-CM | POA: Diagnosis not present

## 2023-08-29 DIAGNOSIS — K31811 Angiodysplasia of stomach and duodenum with bleeding: Secondary | ICD-10-CM | POA: Diagnosis not present

## 2023-08-29 DIAGNOSIS — Z8673 Personal history of transient ischemic attack (TIA), and cerebral infarction without residual deficits: Secondary | ICD-10-CM | POA: Diagnosis not present

## 2023-08-29 DIAGNOSIS — E1122 Type 2 diabetes mellitus with diabetic chronic kidney disease: Secondary | ICD-10-CM | POA: Diagnosis not present

## 2023-08-29 DIAGNOSIS — Z8249 Family history of ischemic heart disease and other diseases of the circulatory system: Secondary | ICD-10-CM | POA: Diagnosis not present

## 2023-08-29 DIAGNOSIS — I120 Hypertensive chronic kidney disease with stage 5 chronic kidney disease or end stage renal disease: Secondary | ICD-10-CM | POA: Diagnosis not present

## 2023-08-29 DIAGNOSIS — E785 Hyperlipidemia, unspecified: Secondary | ICD-10-CM | POA: Diagnosis not present

## 2023-08-29 DIAGNOSIS — Z23 Encounter for immunization: Secondary | ICD-10-CM | POA: Diagnosis not present

## 2023-08-29 DIAGNOSIS — Z1732 Human epidermal growth factor receptor 2 negative status: Secondary | ICD-10-CM | POA: Diagnosis not present

## 2023-08-29 DIAGNOSIS — Z833 Family history of diabetes mellitus: Secondary | ICD-10-CM | POA: Diagnosis not present

## 2023-08-29 DIAGNOSIS — D509 Iron deficiency anemia, unspecified: Secondary | ICD-10-CM

## 2023-08-29 DIAGNOSIS — Z7982 Long term (current) use of aspirin: Secondary | ICD-10-CM | POA: Diagnosis not present

## 2023-08-29 DIAGNOSIS — Z888 Allergy status to other drugs, medicaments and biological substances status: Secondary | ICD-10-CM | POA: Diagnosis not present

## 2023-08-29 DIAGNOSIS — Z79811 Long term (current) use of aromatase inhibitors: Secondary | ICD-10-CM | POA: Diagnosis not present

## 2023-08-29 DIAGNOSIS — Z992 Dependence on renal dialysis: Secondary | ICD-10-CM | POA: Diagnosis not present

## 2023-08-29 DIAGNOSIS — Z79899 Other long term (current) drug therapy: Secondary | ICD-10-CM | POA: Diagnosis not present

## 2023-08-29 DIAGNOSIS — D631 Anemia in chronic kidney disease: Secondary | ICD-10-CM | POA: Diagnosis not present

## 2023-08-29 DIAGNOSIS — J4489 Other specified chronic obstructive pulmonary disease: Secondary | ICD-10-CM | POA: Diagnosis not present

## 2023-08-29 DIAGNOSIS — G4733 Obstructive sleep apnea (adult) (pediatric): Secondary | ICD-10-CM | POA: Diagnosis not present

## 2023-08-29 DIAGNOSIS — C50112 Malignant neoplasm of central portion of left female breast: Secondary | ICD-10-CM | POA: Diagnosis not present

## 2023-08-29 DIAGNOSIS — N186 End stage renal disease: Secondary | ICD-10-CM | POA: Diagnosis not present

## 2023-08-29 DIAGNOSIS — Z170 Estrogen receptor positive status [ER+]: Secondary | ICD-10-CM | POA: Diagnosis not present

## 2023-08-29 LAB — HEMOGLOBIN AND HEMATOCRIT (CANCER CENTER ONLY)
HCT: 29.9 % — ABNORMAL LOW (ref 36.0–46.0)
Hemoglobin: 9.2 g/dL — ABNORMAL LOW (ref 12.0–15.0)

## 2023-08-29 LAB — SAMPLE TO BLOOD BANK

## 2023-08-29 MED ORDER — IRON SUCROSE 20 MG/ML IV SOLN
200.0000 mg | Freq: Once | INTRAVENOUS | Status: AC
  Administered 2023-08-29: 200 mg via INTRAVENOUS
  Filled 2023-08-29: qty 10

## 2023-08-30 ENCOUNTER — Inpatient Hospital Stay

## 2023-08-30 ENCOUNTER — Other Ambulatory Visit

## 2023-08-30 ENCOUNTER — Ambulatory Visit

## 2023-08-31 DIAGNOSIS — Z23 Encounter for immunization: Secondary | ICD-10-CM | POA: Diagnosis not present

## 2023-08-31 DIAGNOSIS — N186 End stage renal disease: Secondary | ICD-10-CM | POA: Diagnosis not present

## 2023-08-31 DIAGNOSIS — Z992 Dependence on renal dialysis: Secondary | ICD-10-CM | POA: Diagnosis not present

## 2023-09-02 ENCOUNTER — Inpatient Hospital Stay: Admitting: Physician Assistant

## 2023-09-03 DIAGNOSIS — Z992 Dependence on renal dialysis: Secondary | ICD-10-CM | POA: Diagnosis not present

## 2023-09-03 DIAGNOSIS — D631 Anemia in chronic kidney disease: Secondary | ICD-10-CM | POA: Diagnosis not present

## 2023-09-03 DIAGNOSIS — Z23 Encounter for immunization: Secondary | ICD-10-CM | POA: Diagnosis not present

## 2023-09-03 DIAGNOSIS — N186 End stage renal disease: Secondary | ICD-10-CM | POA: Diagnosis not present

## 2023-09-04 ENCOUNTER — Telehealth: Payer: Self-pay | Admitting: *Deleted

## 2023-09-04 DIAGNOSIS — D631 Anemia in chronic kidney disease: Secondary | ICD-10-CM

## 2023-09-04 DIAGNOSIS — D509 Iron deficiency anemia, unspecified: Secondary | ICD-10-CM

## 2023-09-04 NOTE — Telephone Encounter (Signed)
 Orders entered

## 2023-09-05 ENCOUNTER — Telehealth: Payer: Self-pay | Admitting: *Deleted

## 2023-09-05 ENCOUNTER — Inpatient Hospital Stay

## 2023-09-05 VITALS — BP 152/45 | HR 72 | Temp 97.8°F | Resp 18

## 2023-09-05 DIAGNOSIS — I120 Hypertensive chronic kidney disease with stage 5 chronic kidney disease or end stage renal disease: Secondary | ICD-10-CM | POA: Diagnosis not present

## 2023-09-05 DIAGNOSIS — Z87891 Personal history of nicotine dependence: Secondary | ICD-10-CM | POA: Diagnosis not present

## 2023-09-05 DIAGNOSIS — K31811 Angiodysplasia of stomach and duodenum with bleeding: Secondary | ICD-10-CM | POA: Diagnosis not present

## 2023-09-05 DIAGNOSIS — Z7982 Long term (current) use of aspirin: Secondary | ICD-10-CM | POA: Diagnosis not present

## 2023-09-05 DIAGNOSIS — D631 Anemia in chronic kidney disease: Secondary | ICD-10-CM | POA: Diagnosis not present

## 2023-09-05 DIAGNOSIS — Z992 Dependence on renal dialysis: Secondary | ICD-10-CM | POA: Diagnosis not present

## 2023-09-05 DIAGNOSIS — E1122 Type 2 diabetes mellitus with diabetic chronic kidney disease: Secondary | ICD-10-CM | POA: Diagnosis not present

## 2023-09-05 DIAGNOSIS — M1990 Unspecified osteoarthritis, unspecified site: Secondary | ICD-10-CM | POA: Diagnosis not present

## 2023-09-05 DIAGNOSIS — C50112 Malignant neoplasm of central portion of left female breast: Secondary | ICD-10-CM | POA: Diagnosis not present

## 2023-09-05 DIAGNOSIS — L930 Discoid lupus erythematosus: Secondary | ICD-10-CM | POA: Diagnosis not present

## 2023-09-05 DIAGNOSIS — Z79811 Long term (current) use of aromatase inhibitors: Secondary | ICD-10-CM | POA: Diagnosis not present

## 2023-09-05 DIAGNOSIS — N186 End stage renal disease: Secondary | ICD-10-CM | POA: Diagnosis not present

## 2023-09-05 DIAGNOSIS — D509 Iron deficiency anemia, unspecified: Secondary | ICD-10-CM

## 2023-09-05 DIAGNOSIS — Z23 Encounter for immunization: Secondary | ICD-10-CM | POA: Diagnosis not present

## 2023-09-05 DIAGNOSIS — Z8673 Personal history of transient ischemic attack (TIA), and cerebral infarction without residual deficits: Secondary | ICD-10-CM | POA: Diagnosis not present

## 2023-09-05 DIAGNOSIS — Z801 Family history of malignant neoplasm of trachea, bronchus and lung: Secondary | ICD-10-CM | POA: Diagnosis not present

## 2023-09-05 DIAGNOSIS — E785 Hyperlipidemia, unspecified: Secondary | ICD-10-CM | POA: Diagnosis not present

## 2023-09-05 DIAGNOSIS — Z833 Family history of diabetes mellitus: Secondary | ICD-10-CM | POA: Diagnosis not present

## 2023-09-05 DIAGNOSIS — Z170 Estrogen receptor positive status [ER+]: Secondary | ICD-10-CM | POA: Diagnosis not present

## 2023-09-05 DIAGNOSIS — Z79899 Other long term (current) drug therapy: Secondary | ICD-10-CM | POA: Diagnosis not present

## 2023-09-05 DIAGNOSIS — G4733 Obstructive sleep apnea (adult) (pediatric): Secondary | ICD-10-CM | POA: Diagnosis not present

## 2023-09-05 DIAGNOSIS — D500 Iron deficiency anemia secondary to blood loss (chronic): Secondary | ICD-10-CM | POA: Diagnosis not present

## 2023-09-05 DIAGNOSIS — Z1732 Human epidermal growth factor receptor 2 negative status: Secondary | ICD-10-CM | POA: Diagnosis not present

## 2023-09-05 DIAGNOSIS — J4489 Other specified chronic obstructive pulmonary disease: Secondary | ICD-10-CM | POA: Diagnosis not present

## 2023-09-05 DIAGNOSIS — Z888 Allergy status to other drugs, medicaments and biological substances status: Secondary | ICD-10-CM | POA: Diagnosis not present

## 2023-09-05 DIAGNOSIS — Z8249 Family history of ischemic heart disease and other diseases of the circulatory system: Secondary | ICD-10-CM | POA: Diagnosis not present

## 2023-09-05 LAB — HEMOGLOBIN AND HEMATOCRIT (CANCER CENTER ONLY)
HCT: 31.7 % — ABNORMAL LOW (ref 36.0–46.0)
Hemoglobin: 9.5 g/dL — ABNORMAL LOW (ref 12.0–15.0)

## 2023-09-05 LAB — SAMPLE TO BLOOD BANK

## 2023-09-05 MED ORDER — IRON SUCROSE 20 MG/ML IV SOLN
200.0000 mg | Freq: Once | INTRAVENOUS | Status: AC
  Administered 2023-09-05: 200 mg via INTRAVENOUS
  Filled 2023-09-05: qty 10

## 2023-09-05 NOTE — Telephone Encounter (Signed)
 The doctor has sent out for patient coming over here on August 28 at 2:45 and that is to get labs and getting IV iron .  In on August 29 at 8:45 for blood infusion. He is ok know that he knows what is going on.

## 2023-09-05 NOTE — Patient Instructions (Signed)

## 2023-09-06 ENCOUNTER — Inpatient Hospital Stay

## 2023-09-07 DIAGNOSIS — Z23 Encounter for immunization: Secondary | ICD-10-CM | POA: Diagnosis not present

## 2023-09-07 DIAGNOSIS — Z992 Dependence on renal dialysis: Secondary | ICD-10-CM | POA: Diagnosis not present

## 2023-09-07 DIAGNOSIS — N186 End stage renal disease: Secondary | ICD-10-CM | POA: Diagnosis not present

## 2023-09-08 DIAGNOSIS — N186 End stage renal disease: Secondary | ICD-10-CM | POA: Diagnosis not present

## 2023-09-08 DIAGNOSIS — Z992 Dependence on renal dialysis: Secondary | ICD-10-CM | POA: Diagnosis not present

## 2023-09-10 DIAGNOSIS — Z992 Dependence on renal dialysis: Secondary | ICD-10-CM | POA: Diagnosis not present

## 2023-09-10 DIAGNOSIS — N186 End stage renal disease: Secondary | ICD-10-CM | POA: Diagnosis not present

## 2023-09-12 ENCOUNTER — Inpatient Hospital Stay

## 2023-09-12 ENCOUNTER — Inpatient Hospital Stay: Attending: Nurse Practitioner

## 2023-09-12 VITALS — BP 152/42 | HR 67 | Temp 98.0°F | Resp 18

## 2023-09-12 DIAGNOSIS — Z888 Allergy status to other drugs, medicaments and biological substances status: Secondary | ICD-10-CM | POA: Diagnosis not present

## 2023-09-12 DIAGNOSIS — Z8673 Personal history of transient ischemic attack (TIA), and cerebral infarction without residual deficits: Secondary | ICD-10-CM | POA: Diagnosis not present

## 2023-09-12 DIAGNOSIS — E785 Hyperlipidemia, unspecified: Secondary | ICD-10-CM | POA: Diagnosis not present

## 2023-09-12 DIAGNOSIS — N186 End stage renal disease: Secondary | ICD-10-CM | POA: Diagnosis not present

## 2023-09-12 DIAGNOSIS — Z853 Personal history of malignant neoplasm of breast: Secondary | ICD-10-CM | POA: Insufficient documentation

## 2023-09-12 DIAGNOSIS — G4733 Obstructive sleep apnea (adult) (pediatric): Secondary | ICD-10-CM | POA: Diagnosis not present

## 2023-09-12 DIAGNOSIS — Z79899 Other long term (current) drug therapy: Secondary | ICD-10-CM | POA: Diagnosis not present

## 2023-09-12 DIAGNOSIS — Z87891 Personal history of nicotine dependence: Secondary | ICD-10-CM | POA: Diagnosis not present

## 2023-09-12 DIAGNOSIS — Z9010 Acquired absence of unspecified breast and nipple: Secondary | ICD-10-CM | POA: Diagnosis not present

## 2023-09-12 DIAGNOSIS — M1990 Unspecified osteoarthritis, unspecified site: Secondary | ICD-10-CM | POA: Insufficient documentation

## 2023-09-12 DIAGNOSIS — E1122 Type 2 diabetes mellitus with diabetic chronic kidney disease: Secondary | ICD-10-CM | POA: Insufficient documentation

## 2023-09-12 DIAGNOSIS — Z79811 Long term (current) use of aromatase inhibitors: Secondary | ICD-10-CM | POA: Insufficient documentation

## 2023-09-12 DIAGNOSIS — Z992 Dependence on renal dialysis: Secondary | ICD-10-CM | POA: Diagnosis not present

## 2023-09-12 DIAGNOSIS — L930 Discoid lupus erythematosus: Secondary | ICD-10-CM | POA: Diagnosis not present

## 2023-09-12 DIAGNOSIS — K5521 Angiodysplasia of colon with hemorrhage: Secondary | ICD-10-CM | POA: Insufficient documentation

## 2023-09-12 DIAGNOSIS — K31811 Angiodysplasia of stomach and duodenum with bleeding: Secondary | ICD-10-CM | POA: Diagnosis not present

## 2023-09-12 DIAGNOSIS — D649 Anemia, unspecified: Secondary | ICD-10-CM

## 2023-09-12 DIAGNOSIS — I120 Hypertensive chronic kidney disease with stage 5 chronic kidney disease or end stage renal disease: Secondary | ICD-10-CM | POA: Insufficient documentation

## 2023-09-12 DIAGNOSIS — R413 Other amnesia: Secondary | ICD-10-CM | POA: Diagnosis not present

## 2023-09-12 DIAGNOSIS — Z806 Family history of leukemia: Secondary | ICD-10-CM | POA: Insufficient documentation

## 2023-09-12 DIAGNOSIS — Z8249 Family history of ischemic heart disease and other diseases of the circulatory system: Secondary | ICD-10-CM | POA: Diagnosis not present

## 2023-09-12 DIAGNOSIS — R5383 Other fatigue: Secondary | ICD-10-CM | POA: Insufficient documentation

## 2023-09-12 DIAGNOSIS — Z801 Family history of malignant neoplasm of trachea, bronchus and lung: Secondary | ICD-10-CM | POA: Diagnosis not present

## 2023-09-12 DIAGNOSIS — J4489 Other specified chronic obstructive pulmonary disease: Secondary | ICD-10-CM | POA: Diagnosis not present

## 2023-09-12 DIAGNOSIS — D631 Anemia in chronic kidney disease: Secondary | ICD-10-CM | POA: Insufficient documentation

## 2023-09-12 DIAGNOSIS — Z833 Family history of diabetes mellitus: Secondary | ICD-10-CM | POA: Insufficient documentation

## 2023-09-12 DIAGNOSIS — D509 Iron deficiency anemia, unspecified: Secondary | ICD-10-CM

## 2023-09-12 LAB — HEMOGLOBIN AND HEMATOCRIT (CANCER CENTER ONLY)
HCT: 33.2 % — ABNORMAL LOW (ref 36.0–46.0)
Hemoglobin: 10.1 g/dL — ABNORMAL LOW (ref 12.0–15.0)

## 2023-09-12 MED ORDER — IRON SUCROSE 20 MG/ML IV SOLN
200.0000 mg | Freq: Once | INTRAVENOUS | Status: AC
  Administered 2023-09-12: 200 mg via INTRAVENOUS
  Filled 2023-09-12: qty 10

## 2023-09-13 ENCOUNTER — Inpatient Hospital Stay

## 2023-09-14 DIAGNOSIS — Z992 Dependence on renal dialysis: Secondary | ICD-10-CM | POA: Diagnosis not present

## 2023-09-14 DIAGNOSIS — N186 End stage renal disease: Secondary | ICD-10-CM | POA: Diagnosis not present

## 2023-09-17 DIAGNOSIS — Z992 Dependence on renal dialysis: Secondary | ICD-10-CM | POA: Diagnosis not present

## 2023-09-17 DIAGNOSIS — N250 Renal osteodystrophy: Secondary | ICD-10-CM | POA: Diagnosis not present

## 2023-09-17 DIAGNOSIS — N186 End stage renal disease: Secondary | ICD-10-CM | POA: Diagnosis not present

## 2023-09-19 ENCOUNTER — Inpatient Hospital Stay

## 2023-09-19 ENCOUNTER — Other Ambulatory Visit: Payer: Self-pay

## 2023-09-19 VITALS — BP 132/74 | HR 72 | Temp 97.1°F | Resp 18

## 2023-09-19 DIAGNOSIS — I120 Hypertensive chronic kidney disease with stage 5 chronic kidney disease or end stage renal disease: Secondary | ICD-10-CM | POA: Diagnosis not present

## 2023-09-19 DIAGNOSIS — Z992 Dependence on renal dialysis: Secondary | ICD-10-CM | POA: Diagnosis not present

## 2023-09-19 DIAGNOSIS — D509 Iron deficiency anemia, unspecified: Secondary | ICD-10-CM

## 2023-09-19 DIAGNOSIS — D631 Anemia in chronic kidney disease: Secondary | ICD-10-CM

## 2023-09-19 LAB — HEMOGLOBIN AND HEMATOCRIT (CANCER CENTER ONLY)
HCT: 40.8 % (ref 36.0–46.0)
Hemoglobin: 12.1 g/dL (ref 12.0–15.0)

## 2023-09-19 LAB — SAMPLE TO BLOOD BANK

## 2023-09-19 MED ORDER — IRON SUCROSE 20 MG/ML IV SOLN
200.0000 mg | Freq: Once | INTRAVENOUS | Status: AC
  Administered 2023-09-19: 200 mg via INTRAVENOUS
  Filled 2023-09-19: qty 10

## 2023-09-19 NOTE — Patient Instructions (Signed)

## 2023-09-20 ENCOUNTER — Encounter

## 2023-09-20 ENCOUNTER — Inpatient Hospital Stay

## 2023-09-21 DIAGNOSIS — Z992 Dependence on renal dialysis: Secondary | ICD-10-CM | POA: Diagnosis not present

## 2023-09-21 DIAGNOSIS — N186 End stage renal disease: Secondary | ICD-10-CM | POA: Diagnosis not present

## 2023-09-24 DIAGNOSIS — N186 End stage renal disease: Secondary | ICD-10-CM | POA: Diagnosis not present

## 2023-09-24 DIAGNOSIS — Z992 Dependence on renal dialysis: Secondary | ICD-10-CM | POA: Diagnosis not present

## 2023-09-26 ENCOUNTER — Inpatient Hospital Stay (HOSPITAL_BASED_OUTPATIENT_CLINIC_OR_DEPARTMENT_OTHER): Admitting: Nurse Practitioner

## 2023-09-26 ENCOUNTER — Inpatient Hospital Stay

## 2023-09-26 ENCOUNTER — Encounter: Payer: Self-pay | Admitting: Nurse Practitioner

## 2023-09-26 VITALS — BP 137/49 | HR 73 | Temp 96.8°F | Resp 20 | Wt 165.3 lb

## 2023-09-26 DIAGNOSIS — D631 Anemia in chronic kidney disease: Secondary | ICD-10-CM

## 2023-09-26 DIAGNOSIS — N186 End stage renal disease: Secondary | ICD-10-CM | POA: Diagnosis not present

## 2023-09-26 DIAGNOSIS — Z79899 Other long term (current) drug therapy: Secondary | ICD-10-CM | POA: Diagnosis not present

## 2023-09-26 DIAGNOSIS — N184 Chronic kidney disease, stage 4 (severe): Secondary | ICD-10-CM | POA: Diagnosis not present

## 2023-09-26 DIAGNOSIS — G4733 Obstructive sleep apnea (adult) (pediatric): Secondary | ICD-10-CM | POA: Diagnosis not present

## 2023-09-26 DIAGNOSIS — Z806 Family history of leukemia: Secondary | ICD-10-CM | POA: Diagnosis not present

## 2023-09-26 DIAGNOSIS — Z992 Dependence on renal dialysis: Secondary | ICD-10-CM | POA: Diagnosis not present

## 2023-09-26 DIAGNOSIS — E785 Hyperlipidemia, unspecified: Secondary | ICD-10-CM | POA: Diagnosis not present

## 2023-09-26 DIAGNOSIS — Z853 Personal history of malignant neoplasm of breast: Secondary | ICD-10-CM | POA: Diagnosis not present

## 2023-09-26 DIAGNOSIS — Z801 Family history of malignant neoplasm of trachea, bronchus and lung: Secondary | ICD-10-CM | POA: Diagnosis not present

## 2023-09-26 DIAGNOSIS — K5521 Angiodysplasia of colon with hemorrhage: Secondary | ICD-10-CM | POA: Diagnosis not present

## 2023-09-26 DIAGNOSIS — R413 Other amnesia: Secondary | ICD-10-CM | POA: Diagnosis not present

## 2023-09-26 DIAGNOSIS — D500 Iron deficiency anemia secondary to blood loss (chronic): Secondary | ICD-10-CM | POA: Diagnosis not present

## 2023-09-26 DIAGNOSIS — I120 Hypertensive chronic kidney disease with stage 5 chronic kidney disease or end stage renal disease: Secondary | ICD-10-CM | POA: Diagnosis not present

## 2023-09-26 DIAGNOSIS — M1990 Unspecified osteoarthritis, unspecified site: Secondary | ICD-10-CM | POA: Diagnosis not present

## 2023-09-26 DIAGNOSIS — Z79811 Long term (current) use of aromatase inhibitors: Secondary | ICD-10-CM | POA: Diagnosis not present

## 2023-09-26 DIAGNOSIS — Z8249 Family history of ischemic heart disease and other diseases of the circulatory system: Secondary | ICD-10-CM | POA: Diagnosis not present

## 2023-09-26 DIAGNOSIS — R5383 Other fatigue: Secondary | ICD-10-CM | POA: Diagnosis not present

## 2023-09-26 DIAGNOSIS — Z8673 Personal history of transient ischemic attack (TIA), and cerebral infarction without residual deficits: Secondary | ICD-10-CM | POA: Diagnosis not present

## 2023-09-26 DIAGNOSIS — J4489 Other specified chronic obstructive pulmonary disease: Secondary | ICD-10-CM | POA: Diagnosis not present

## 2023-09-26 DIAGNOSIS — D649 Anemia, unspecified: Secondary | ICD-10-CM

## 2023-09-26 DIAGNOSIS — L930 Discoid lupus erythematosus: Secondary | ICD-10-CM | POA: Diagnosis not present

## 2023-09-26 DIAGNOSIS — K31811 Angiodysplasia of stomach and duodenum with bleeding: Secondary | ICD-10-CM | POA: Diagnosis not present

## 2023-09-26 DIAGNOSIS — E1122 Type 2 diabetes mellitus with diabetic chronic kidney disease: Secondary | ICD-10-CM | POA: Diagnosis not present

## 2023-09-26 DIAGNOSIS — Z87891 Personal history of nicotine dependence: Secondary | ICD-10-CM | POA: Diagnosis not present

## 2023-09-26 DIAGNOSIS — Z888 Allergy status to other drugs, medicaments and biological substances status: Secondary | ICD-10-CM | POA: Diagnosis not present

## 2023-09-26 DIAGNOSIS — Z9010 Acquired absence of unspecified breast and nipple: Secondary | ICD-10-CM | POA: Diagnosis not present

## 2023-09-26 LAB — CMP (CANCER CENTER ONLY)
ALT: 16 U/L (ref 0–44)
AST: 26 U/L (ref 15–41)
Albumin: 3.5 g/dL (ref 3.5–5.0)
Alkaline Phosphatase: 76 U/L (ref 38–126)
Anion gap: 10 (ref 5–15)
BUN: 17 mg/dL (ref 8–23)
CO2: 25 mmol/L (ref 22–32)
Calcium: 9 mg/dL (ref 8.9–10.3)
Chloride: 100 mmol/L (ref 98–111)
Creatinine: 3.36 mg/dL — ABNORMAL HIGH (ref 0.44–1.00)
GFR, Estimated: 13 mL/min — ABNORMAL LOW (ref >60)
Glucose, Bld: 155 mg/dL — ABNORMAL HIGH (ref 70–99)
Potassium: 3.1 mmol/L — ABNORMAL LOW (ref 3.5–5.1)
Sodium: 135 mmol/L (ref 135–145)
Total Bilirubin: 0.8 mg/dL (ref 0.0–1.2)
Total Protein: 6.9 g/dL (ref 6.5–8.1)

## 2023-09-26 LAB — CBC WITH DIFFERENTIAL (CANCER CENTER ONLY)
Abs Immature Granulocytes: 0.06 K/uL (ref 0.00–0.07)
Basophils Absolute: 0.1 K/uL (ref 0.0–0.1)
Basophils Relative: 1 %
Eosinophils Absolute: 0.2 K/uL (ref 0.0–0.5)
Eosinophils Relative: 3 %
HCT: 40.2 % (ref 36.0–46.0)
Hemoglobin: 12.5 g/dL (ref 12.0–15.0)
Immature Granulocytes: 1 %
Lymphocytes Relative: 20 %
Lymphs Abs: 1.4 K/uL (ref 0.7–4.0)
MCH: 26.3 pg (ref 26.0–34.0)
MCHC: 31.1 g/dL (ref 30.0–36.0)
MCV: 84.6 fL (ref 80.0–100.0)
Monocytes Absolute: 0.7 K/uL (ref 0.1–1.0)
Monocytes Relative: 11 %
Neutro Abs: 4.4 K/uL (ref 1.7–7.7)
Neutrophils Relative %: 64 %
Platelet Count: 198 K/uL (ref 150–400)
RBC: 4.75 MIL/uL (ref 3.87–5.11)
RDW: 17.3 % — ABNORMAL HIGH (ref 11.5–15.5)
WBC Count: 6.8 K/uL (ref 4.0–10.5)
nRBC: 0.6 % — ABNORMAL HIGH (ref 0.0–0.2)

## 2023-09-26 LAB — IRON AND TIBC
Iron: 46 ug/dL (ref 28–170)
Saturation Ratios: 18 % (ref 10.4–31.8)
TIBC: 255 ug/dL (ref 250–450)
UIBC: 209 ug/dL

## 2023-09-26 LAB — FERRITIN: Ferritin: 339 ng/mL — ABNORMAL HIGH (ref 11–307)

## 2023-09-26 NOTE — Progress Notes (Signed)
 [facility]  [phone] [phone]  ID: Jennifer Carrillo OB: Jul 08, 1933  MR#: [id number]  RDW#:[id number]  Patient Care Team: Kristina Tinnie [name] as PCP - General (Physician Assistant) Nicholaus Sherlean LITTIE, [facility] (Inactive) as Pharmacist (Pharmacist) Rennie Cindy SAUNDERS, MD as Consulting Physician (Oncology)  REFERRING PROVIDER: Tinnie Kristina  REASON FOR REFERRAL: anemia of chronic disease  HPI: Jennifer Carrillo is a 89 y.o. female with past medical history of hypertension, hyperlipidemia, IDA, COPD, diabetes, left breast cancer, OSA not on CPAP, stroke, ESRD on HD was referred to hematology for normocytic anemia.  Patient has history of ESRD on HD Tuesday Thursday and Saturday regimen.  Follows with Dr. Dennise as outpatient.  She has been getting EPO injection and iron  infusions per nephrology.  Labs from June 2024 showed hemoglobin of 8.1, WBC 6.3 and platelet 300.  Iron  32, saturation 12% and ferritin of 273, B12 523, folate 15.8, TSH 2.25.  Patient has history of left breast cancer.  Oncology history obtained from Dr. Evalene Charter [facility] heme-onc.  Last seen in March 2021.  Oncology History Overview Note  11/10/15: Mammogram with 2 cm left breast abnormality; US  confirmed. Axillary negative.  12/07/15: Bx : Breast, left, 6 o'clock, core biopsy  - Invasive ductal carcinoma - Nottingham combined histologic grade: 1 Tubule formation: 2 Nuclear grade: 2 Mitotic score: 1 - Invasive carcinoma measures approximately 9 mm in this core biopsy specimen - Microcalcifications are associated with invasive carcinoma - ER 100%; PR 1-2%; Her 2+, FISH negative   12/20/15: Partial Mastectomy A: Breast, left savi guided partial mastectomy -Invasive ductal carcinoma with associated ductal carcinoma in situ (DCIS) (see synoptic) -Invasive carcinoma measures 14.0 mm in greatest dimension -Margin status in part A only (correlate with additional  parts to determine final margin status)      - Adenocarcinoma is greater than 2 mm from all surgical margins      - DCIS is focally positive at the posterior black-inked surgical margin; DCIS is greater than 2 mm from all other surgical margins -Ancillary studies previously reported on       - Estrogen receptor: Positive, 91-100%, strong      - Progesterone receptor: Margins positive, 1-10%, moderate      - HER2/Neu: 2+ equivocal FISH results HER2/Neu: Not amplified -See comment   B: Breast, left, posterior margin, excision - Ductal carcinoma in-situ present, 6.0 mm from the black-inked surgical margin - Negative for invasive carcinoma   C: Breast, left, medial margin, excision - Negative for in-situ and invasive carcinoma   D: Breast, left, inferior margin, excision - Negative for in-situ and invasive carcinoma   E: Breast, left, lateral margin, excision - Negative for in-situ and invasive carcinoma   F: Breast, left, superior margin, excision - Ductal carcinoma in-situ is 1.7 mm from the blue-inked surgical margin - Negative for invasive carcinoma  Malignant neoplasm of left female breast (CMS-HCC)  12/16/2015 Initial Diagnosis  Malignant neoplasm of breast in female, estrogen receptor positive (RAF-HCC)   On anastrozole  1 mg daily  2 sons, 1 daughter (2 died)    INTERVAL HISTORY: Jennifer Carrillo is a 89 y.o. female currently receiving hemodialysis with Dr. Dennise who returns to clinic for possible transfusion.  Patient previously has a history of melena and AVMs and underwent an enteroscopy with Dr. Unk on 07/24/2023.  In the interim, she has been receiving IV iron  infusions.  Continues dialysis 3 days a week.  She says that she feels  well today and her energy improved.   REVIEW OF SYSTEMS:   Review of Systems  Unable to perform ROS: Dementia  Constitutional:  Positive for malaise/fatigue.  HENT:  Negative for nosebleeds.   Respiratory:  Negative for shortness of  breath.   Cardiovascular:  Negative for chest pain.  Gastrointestinal:  Negative for blood in stool and melena.  Genitourinary:  Negative for dysuria and urgency.  Neurological:  Negative for weakness.  Psychiatric/Behavioral:  Positive for memory loss.   As per HPI. Otherwise, a complete review of systems is negative.  PAST MEDICAL HISTORY: Past Medical History:  Diagnosis Date   Arthritis    Asthma    Calf pain    Cancer (HCC) 2016   left breast   COPD (chronic obstructive pulmonary disease) (HCC)    Diabetes (HCC)    Discoid lupus    Gastro-esophageal reflux    Glaucoma    Hyperlipemia    Hypertension    Iron  deficiency anemia    Joint pain    Leg swelling    Osteoarthritis    PVD (peripheral vascular disease) (HCC)    Sinus problem    Sleep apnea    No CPAP/ Can't tolerate   Stroke (HCC) 1987    PAST SURGICAL HISTORY: Past Surgical History:  Procedure Laterality Date   BREAST SURGERY Left    left lumpectomy   CAROTID ANGIOGRAPHY Left 06/25/2018   Procedure: CAROTID ANGIOGRAPHY, possible intervention;  Surgeon: Jama Cordella MATSU, MD;  Location: [facility] INVASIVE CV LAB;  Service: Cardiovascular;  Laterality: Left;   CAROTID ENDARTERECTOMY Right    CAROTID PTA/STENT INTERVENTION Left 06/25/2018   Procedure: CAROTID PTA/STENT INTERVENTION;  Surgeon: Jama Cordella MATSU, MD;  Location: [facility] INVASIVE CV LAB;  Service: Cardiovascular;  Laterality: Left;   CATARACT EXTRACTION Right 2013   CORONARY STENT PLACEMENT     L. L. E.   DIALYSIS/PERMA CATHETER INSERTION N/A 12/29/2020   Procedure: DIALYSIS/PERMA CATHETER INSERTION;  Surgeon: Marea Selinda RAMAN, MD;  Location: [facility] INV[facility] CV [facility];  Service: Cardiovascular;  Laterality: N/A;   DIALYSIS/PERMA CATHETER INSERTION N/A 12/14/2021   Procedure: DIALYSIS/PERMA CATHETER INSERTION;  Surgeon: Jama Cordella MATSU, MD;  Location: [facility] INV[facility]IVE CV LAB;  Service: Cardiovascular;  Laterality: N/A;   ENTEROSCOPY N/A 07/24/2023   Procedure:  ENTEROSCOPY;  Surgeon: Unk Corinn Skiff, MD;  Location: [facility] ENDOSCOPY;  Service: Gastroenterology;  Laterality: N/A;   ESOPHAGOGASTRODUODENOSCOPY N/A 05/28/2023   Procedure: EGD (ESOPHAGOGASTRODUODENOSCOPY);  Surgeon: Jinny Carmine, MD;  Location: [facility] ENDOSCOPY;  Service: Endoscopy;  Laterality: N/A;   EYE SURGERY Bilateral    cataract   HOT HEMOSTASIS  05/28/2023   Procedure: EGD, WITH ARGON PLASMA COAGULATION;  Surgeon: Jinny Carmine, MD;  Location: [facility] ENDOSCOPY;  Service: Endoscopy;;   HOT HEMOSTASIS  07/24/2023   Procedure: EGD, WITH ARGON PLASMA COAGULATION;  Surgeon: Unk Corinn Skiff, MD;  Location: [facility] ENDOSCOPY;  Service: Gastroenterology;;   LEFT HEART CATH AND CORONARY ANGIOGRAPHY Left 05/06/2018   Procedure: LEFT HEART CATH AND CORONARY ANGIOGRAPHY;  Surgeon: Hester Wolm PARAS, MD;  Location: [facility] INVASIVE CV [facility];  Service: Cardiovascular;  Laterality: Left;   LOWER EXTREMITY ANGIOGRAPHY Right 01/22/2018   Procedure: LOWER EXTREMITY ANGIOGRAPHY;  Surgeon: Jama Cordella MATSU, MD;  Location: [facility]VAS[facility] CV [facility];  Service: Cardiovascular;  Laterality: Right;   RENAL ANGIOGRAPHY Left 02/26/2018   Procedure: RENAL ANGIOGRAPHY;  Surgeon: Jama Cordella MATSU, MD;  Location: [facility] INVASIVE CV [facility];  Service: Cardiovascular;  Laterality: Left;   STENT PLACEMENT VASCULAR [facility]  HX) Left    stent placement on LLE   SUBMUCOSAL INJECTION  07/24/2023   Procedure: INJECTION, SUBMUCOSAL;  Surgeon: Unk Corinn Skiff, MD;  Location: [facility] ENDOSCOPY;  Service: Gastroenterology;;   TUBAL LIGATION     FAMILY HISTORY: Family History  Problem Relation Age of Onset   Heart disease Mother    Diabetes Sister    Leukemia Daughter    Lung cancer Brother    Diabetes Other    Hypertension Other    Bladder Cancer Neg Hx    Kidney disease Neg Hx    Prostate cancer Neg Hx    Breast cancer Neg Hx    HEALTH MAINTENANCE: Social History   Tobacco Use   Smoking status: Former    Current packs/day: 0.00     Types: Cigarettes    Quit date: 1987    Years since quitting: 38.7   Smokeless tobacco: Never   Tobacco comments:    quit 31 years  Vaping Use   Vaping status: Never Used  Substance Use Topics   Alcohol use: No   Drug use: No   Allergies  Allergen Reactions   Benazepril Nausea Only   Statins Other (See Comments)    Myalgia  swelling    Outside Source Comment: swelling   Current Outpatient Medications  Medication Sig Dispense Refill   ACCU-CHEK GUIDE test strip TEST BLOOD SUGAR THREE TIMES DAILY  AS  DIRECTED 300 strip 1   Accu-Chek Softclix Lancets lancets USE AS INSTRUCTED 3 TIMES A DAY 300 each 3   albuterol  (VENTOLIN  HFA) 108 (90 Base) MCG/ACT inhaler Inhale 2 puffs into the lungs every 6 (six) hours as needed for wheezing or shortness of breath. 8 g 2   Alcohol Swabs (B-D SINGLE USE SWABS REGULAR) PADS Use as directed for 3 times daily DX E11.65 100 each 1   amLODipine  (NORVASC ) 5 MG tablet Skip the dose if systolic BP less than 140 mmHg 30 tablet 11   Blood Glucose Monitoring Suppl (ACCU-CHEK GUIDE ME) w/Device KIT Use as instructed. DX e11.65 1 kit 0   budesonide -glycopyrrolate -formoterol  (BREZTRI  AEROSPHERE) 160-9-4.8 MCG/ACT AERO inhaler Inhale 2 puffs into the lungs 2 (two) times daily. 10.7 g 11   Dextromethorphan -guaiFENesin  20-200 MG/20ML LIQD Take 10 mLs by mouth every 6 (six) hours as needed. 118 mL 0   donepezil  (ARICEPT ) 10 MG tablet TAKE 1 TABLET AT BEDTIME FOR MEMORY (NEED MD APPOINTMENT) 90 tablet 3   ferrous sulfate  325 (65 FE) MG tablet Take 1 tablet (325 mg total) by mouth daily with breakfast. 30 tablet 2   hydrALAZINE  (APRESOLINE ) 100 MG tablet Take 100 mg by mouth 2 (two) times daily.     QUEtiapine  (SEROQUEL ) 50 MG tablet Take 1 tablet (50 mg total) by mouth every evening. 30 tablet 0   torsemide (DEMADEX) 100 MG tablet Take 100 mg by mouth daily.     Vitamin D , Ergocalciferol , (DRISDOL ) 1.25 MG (50000 UNIT) CAPS capsule Take 1 capsule (50,000 Units  total) by mouth every 7 (seven) days. 4 capsule 2   acetaminophen  (TYLENOL ) 500 MG tablet Take 1 tablet (500 mg total) by mouth every 6 (six) hours as needed for moderate pain or headache. (Patient not taking: Reported on 09/26/2023) 30 tablet 0   No current facility-administered medications for this visit.   OBJECTIVE: Vitals:   09/26/23 1437  BP: (!) 137/49  Pulse: 73  Resp: 20  Temp: (!) 96.8 F (36 C)  SpO2: 100%     Body mass  index is 25.89 kg/m.      General: Well-developed, well-nourished, no acute distress. Eyes: Pink conjunctiva, anicteric sclera. HEENT: Normocephalic, moist mucous membranes, clear oropharnyx. Lungs: Clear to auscultation bilaterally. Heart: Regular rate and rhythm. No rubs, murmurs, or gallops. Abdomen: Soft, nontender, nondistended. No organomegaly noted, normoactive bowel sounds. Musculoskeletal: No edema, cyanosis, or clubbing. Neuro: Alert. Dementia at baseline.  Skin: No rashes or petechiae noted. Psych: Normal affect. Lymphatics: No cervical, calvicular, axillary or inguinal LAD.   LAB RESULTS: Lab Results  Component Value Date   NA 135 09/26/2023   K 3.1 (L) 09/26/2023   CL 100 09/26/2023   CO2 25 09/26/2023   GLUCOSE 155 (H) 09/26/2023   BUN 17 09/26/2023   CREATININE 3.36 (H) 09/26/2023   CALCIUM  9.0 09/26/2023   PROT 6.9 09/26/2023   ALBUMIN 3.5 09/26/2023   AST 26 09/26/2023   ALT 16 09/26/2023   ALKPHOS 76 09/26/2023   BILITOT 0.8 09/26/2023   GFRNONAA 13 (L) 09/26/2023   GFRAA 31 (L) 02/13/2019   Lab Results  Component Value Date   WBC 6.8 09/26/2023   NEUTROABS 4.4 09/26/2023   HGB 12.5 09/26/2023   HCT 40.2 09/26/2023   MCV 84.6 09/26/2023   PLT 198 09/26/2023   Lab Results  Component Value Date   TIBC 255 09/26/2023   TIBC 344 07/19/2023   TIBC 385 05/25/2023   FERRITIN 339 (H) 09/26/2023   FERRITIN 136 07/19/2023   FERRITIN 130 05/25/2023   IRONPCTSAT 18 09/26/2023   IRONPCTSAT 10 (L) 07/19/2023    IRONPCTSAT 10 (L) 05/25/2023   STUDIES: No results found.  ASSESSMENT AND PLAN:   Jennifer Carrillo is a 89 y.o. female with pmh of hypertension, hyperlipidemia, IDA, COPD, diabetes, left breast cancer, OSA not on CPAP, stroke, ESRD on HD was referred to hematology for normocytic anemia.  # Iron  deficiency anemia- chronic anemia of CKD, stage V, on hemodialysis and iron  deficiency anemia due to chronic blood loss in setting of gastric and small bowel AVMs.  In May 2025 she presented to [facility] ER with melena and found to have a hemoglobin of 6.7.  She underwent EGD which found multiple AVMs in the duodenum, treated with APC.  Responded to transfusion and hemoglobin was 9.4 at time of discharge.  Hemoglobin dropped to 7.6 with dialysis and she was referred back to GI for further evaluation.  She is unsure if she has been receiving erythropoietin  during dialysis. - Goal ferritin around 500 in the setting of ESRD per Dr Clista - may consider long term octreotide to reduce risk of bleeding from small bowel AVMs.  - s/p enteroscopy with Dr. Unk- 1 single bleeding angioectasia treated with APC. Otherwise normal.  - plan for additional IV iron  monthly.   # Symptomatic anemia- s/p transfusion on 08/12/23 for hmg 6.5.  - Symptomatically improved. Hmg 12.5 today - Hold transfusion. Plan to check counts monthly. Transfuse for hmg < 7.   # ESRD on HD  - Patient follows with Dr. Dennise nephrology as outpatient.  She is on dialysis Tuesday Thursday and Saturday. Patient has been getting EPO injections and IV iron  infusions per nephrology.  We had previously deferred her hematology management to nephrology since she was on HD however, given drops in her counts due to AVMs/blood loss, she was re-referred.  - Discussed the need to optimize iron  stores in the setting of end-stage renal disease.  -Proceed with venofer  monthly given improvement and check counts. If counts drop, we  can see her sooner.   # History of  left breast cancer, stage Ia, ER 91 to 100% positive, 1 to 10% positive and HER2 negative. - Diagnosed in 2017.  Status post left lumpectomy at [facility].  Pathology showing IDC, 14 mm, margins negative for carcinoma, DCIS focally present at the posterior black inked margin.  ER 91 to 100% positive, PR 1 to 10% and HER2 negative by FISH.  She was treated by Dr. Evalene Charter heme-onc at [facility].  Last followed up in March 2021.  She is on anastrozole  1 mg daily since beginning of 2018.  Discussed with the patient that she had early stage hormone positive breast cancer with negative margins for carcinoma.  5 years of anastrozole  should be sufficient.  She has compelted 7 years of anastrozole . D/c d/t bone effects. - Last mammogram was in September 2024 was negative. Plan to repeat September 2025.   Disposition:  No iron  today 4 weeks- lab (H&H, hold tube), venofer  D2 possible transfusion 8 weeks- lab (cbc, ferritin, iron  studies, hold tube), Dr Rennie or myself, +/- venofer  D2 possible transfusion- la  Patient expressed understanding and was in agreement with this plan. She also understands that She can call clinic at any time with any questions, concerns, or complaints.   Tinnie KANDICE Dawn, NP   09/26/2023   CC: Dr Unk

## 2023-09-27 ENCOUNTER — Inpatient Hospital Stay

## 2023-09-28 DIAGNOSIS — Z992 Dependence on renal dialysis: Secondary | ICD-10-CM | POA: Diagnosis not present

## 2023-09-28 DIAGNOSIS — N186 End stage renal disease: Secondary | ICD-10-CM | POA: Diagnosis not present

## 2023-09-29 DIAGNOSIS — R269 Unspecified abnormalities of gait and mobility: Secondary | ICD-10-CM | POA: Diagnosis not present

## 2023-09-30 ENCOUNTER — Inpatient Hospital Stay

## 2023-09-30 ENCOUNTER — Inpatient Hospital Stay: Admitting: Internal Medicine

## 2023-10-01 ENCOUNTER — Encounter

## 2023-10-01 DIAGNOSIS — Z992 Dependence on renal dialysis: Secondary | ICD-10-CM | POA: Diagnosis not present

## 2023-10-01 DIAGNOSIS — Z794 Long term (current) use of insulin: Secondary | ICD-10-CM | POA: Diagnosis not present

## 2023-10-01 DIAGNOSIS — E119 Type 2 diabetes mellitus without complications: Secondary | ICD-10-CM | POA: Diagnosis not present

## 2023-10-01 DIAGNOSIS — N186 End stage renal disease: Secondary | ICD-10-CM | POA: Diagnosis not present

## 2023-10-01 DIAGNOSIS — D509 Iron deficiency anemia, unspecified: Secondary | ICD-10-CM | POA: Diagnosis not present

## 2023-10-03 DIAGNOSIS — Z992 Dependence on renal dialysis: Secondary | ICD-10-CM | POA: Diagnosis not present

## 2023-10-03 DIAGNOSIS — N186 End stage renal disease: Secondary | ICD-10-CM | POA: Diagnosis not present

## 2023-10-05 DIAGNOSIS — Z992 Dependence on renal dialysis: Secondary | ICD-10-CM | POA: Diagnosis not present

## 2023-10-05 DIAGNOSIS — N186 End stage renal disease: Secondary | ICD-10-CM | POA: Diagnosis not present

## 2023-10-08 DIAGNOSIS — Z992 Dependence on renal dialysis: Secondary | ICD-10-CM | POA: Diagnosis not present

## 2023-10-08 DIAGNOSIS — N186 End stage renal disease: Secondary | ICD-10-CM | POA: Diagnosis not present

## 2023-10-09 ENCOUNTER — Encounter

## 2023-10-09 ENCOUNTER — Encounter: Payer: Self-pay | Admitting: Internal Medicine

## 2023-10-10 DIAGNOSIS — Z23 Encounter for immunization: Secondary | ICD-10-CM | POA: Diagnosis not present

## 2023-10-10 DIAGNOSIS — Z992 Dependence on renal dialysis: Secondary | ICD-10-CM | POA: Diagnosis not present

## 2023-10-10 DIAGNOSIS — N186 End stage renal disease: Secondary | ICD-10-CM | POA: Diagnosis not present

## 2023-10-12 DIAGNOSIS — Z992 Dependence on renal dialysis: Secondary | ICD-10-CM | POA: Diagnosis not present

## 2023-10-12 DIAGNOSIS — Z23 Encounter for immunization: Secondary | ICD-10-CM | POA: Diagnosis not present

## 2023-10-12 DIAGNOSIS — N186 End stage renal disease: Secondary | ICD-10-CM | POA: Diagnosis not present

## 2023-10-15 DIAGNOSIS — N186 End stage renal disease: Secondary | ICD-10-CM | POA: Diagnosis not present

## 2023-10-15 DIAGNOSIS — Z992 Dependence on renal dialysis: Secondary | ICD-10-CM | POA: Diagnosis not present

## 2023-10-15 DIAGNOSIS — Z23 Encounter for immunization: Secondary | ICD-10-CM | POA: Diagnosis not present

## 2023-10-17 DIAGNOSIS — N186 End stage renal disease: Secondary | ICD-10-CM | POA: Diagnosis not present

## 2023-10-17 DIAGNOSIS — Z23 Encounter for immunization: Secondary | ICD-10-CM | POA: Diagnosis not present

## 2023-10-17 DIAGNOSIS — Z992 Dependence on renal dialysis: Secondary | ICD-10-CM | POA: Diagnosis not present

## 2023-10-19 DIAGNOSIS — Z23 Encounter for immunization: Secondary | ICD-10-CM | POA: Diagnosis not present

## 2023-10-19 DIAGNOSIS — Z992 Dependence on renal dialysis: Secondary | ICD-10-CM | POA: Diagnosis not present

## 2023-10-19 DIAGNOSIS — N186 End stage renal disease: Secondary | ICD-10-CM | POA: Diagnosis not present

## 2023-10-22 DIAGNOSIS — N250 Renal osteodystrophy: Secondary | ICD-10-CM | POA: Diagnosis not present

## 2023-10-22 DIAGNOSIS — Z992 Dependence on renal dialysis: Secondary | ICD-10-CM | POA: Diagnosis not present

## 2023-10-22 DIAGNOSIS — N186 End stage renal disease: Secondary | ICD-10-CM | POA: Diagnosis not present

## 2023-10-22 DIAGNOSIS — Z23 Encounter for immunization: Secondary | ICD-10-CM | POA: Diagnosis not present

## 2023-10-23 ENCOUNTER — Inpatient Hospital Stay

## 2023-10-23 ENCOUNTER — Other Ambulatory Visit: Payer: Self-pay | Admitting: *Deleted

## 2023-10-23 DIAGNOSIS — D500 Iron deficiency anemia secondary to blood loss (chronic): Secondary | ICD-10-CM

## 2023-10-23 DIAGNOSIS — D631 Anemia in chronic kidney disease: Secondary | ICD-10-CM

## 2023-10-24 ENCOUNTER — Inpatient Hospital Stay

## 2023-10-24 ENCOUNTER — Inpatient Hospital Stay: Attending: Nurse Practitioner

## 2023-10-24 VITALS — BP 168/61 | HR 69 | Resp 16

## 2023-10-24 DIAGNOSIS — Z79899 Other long term (current) drug therapy: Secondary | ICD-10-CM | POA: Insufficient documentation

## 2023-10-24 DIAGNOSIS — D509 Iron deficiency anemia, unspecified: Secondary | ICD-10-CM

## 2023-10-24 DIAGNOSIS — D631 Anemia in chronic kidney disease: Secondary | ICD-10-CM

## 2023-10-24 DIAGNOSIS — D500 Iron deficiency anemia secondary to blood loss (chronic): Secondary | ICD-10-CM

## 2023-10-24 DIAGNOSIS — D649 Anemia, unspecified: Secondary | ICD-10-CM

## 2023-10-24 DIAGNOSIS — Z992 Dependence on renal dialysis: Secondary | ICD-10-CM | POA: Diagnosis not present

## 2023-10-24 DIAGNOSIS — Z23 Encounter for immunization: Secondary | ICD-10-CM | POA: Diagnosis not present

## 2023-10-24 DIAGNOSIS — N186 End stage renal disease: Secondary | ICD-10-CM | POA: Diagnosis not present

## 2023-10-24 LAB — HEMOGLOBIN AND HEMATOCRIT (CANCER CENTER ONLY)
HCT: 40 % (ref 36.0–46.0)
Hemoglobin: 12.5 g/dL (ref 12.0–15.0)

## 2023-10-24 LAB — SAMPLE TO BLOOD BANK

## 2023-10-24 MED ORDER — IRON SUCROSE 20 MG/ML IV SOLN
200.0000 mg | Freq: Once | INTRAVENOUS | Status: DC
  Filled 2023-10-24: qty 10

## 2023-10-25 ENCOUNTER — Inpatient Hospital Stay

## 2023-10-26 DIAGNOSIS — Z992 Dependence on renal dialysis: Secondary | ICD-10-CM | POA: Diagnosis not present

## 2023-10-26 DIAGNOSIS — N186 End stage renal disease: Secondary | ICD-10-CM | POA: Diagnosis not present

## 2023-10-26 DIAGNOSIS — Z23 Encounter for immunization: Secondary | ICD-10-CM | POA: Diagnosis not present

## 2023-10-29 DIAGNOSIS — Z992 Dependence on renal dialysis: Secondary | ICD-10-CM | POA: Diagnosis not present

## 2023-10-29 DIAGNOSIS — N186 End stage renal disease: Secondary | ICD-10-CM | POA: Diagnosis not present

## 2023-10-29 DIAGNOSIS — Z23 Encounter for immunization: Secondary | ICD-10-CM | POA: Diagnosis not present

## 2023-10-31 DIAGNOSIS — Z992 Dependence on renal dialysis: Secondary | ICD-10-CM | POA: Diagnosis not present

## 2023-10-31 DIAGNOSIS — N186 End stage renal disease: Secondary | ICD-10-CM | POA: Diagnosis not present

## 2023-10-31 DIAGNOSIS — Z23 Encounter for immunization: Secondary | ICD-10-CM | POA: Diagnosis not present

## 2023-11-02 DIAGNOSIS — Z23 Encounter for immunization: Secondary | ICD-10-CM | POA: Diagnosis not present

## 2023-11-02 DIAGNOSIS — Z992 Dependence on renal dialysis: Secondary | ICD-10-CM | POA: Diagnosis not present

## 2023-11-02 DIAGNOSIS — N186 End stage renal disease: Secondary | ICD-10-CM | POA: Diagnosis not present

## 2023-11-05 DIAGNOSIS — Z992 Dependence on renal dialysis: Secondary | ICD-10-CM | POA: Diagnosis not present

## 2023-11-05 DIAGNOSIS — Z23 Encounter for immunization: Secondary | ICD-10-CM | POA: Diagnosis not present

## 2023-11-05 DIAGNOSIS — N186 End stage renal disease: Secondary | ICD-10-CM | POA: Diagnosis not present

## 2023-11-05 DIAGNOSIS — D509 Iron deficiency anemia, unspecified: Secondary | ICD-10-CM | POA: Diagnosis not present

## 2023-11-07 DIAGNOSIS — Z992 Dependence on renal dialysis: Secondary | ICD-10-CM | POA: Diagnosis not present

## 2023-11-07 DIAGNOSIS — Z23 Encounter for immunization: Secondary | ICD-10-CM | POA: Diagnosis not present

## 2023-11-07 DIAGNOSIS — N186 End stage renal disease: Secondary | ICD-10-CM | POA: Diagnosis not present

## 2023-11-08 ENCOUNTER — Encounter: Payer: Self-pay | Admitting: Internal Medicine

## 2023-11-08 DIAGNOSIS — Z992 Dependence on renal dialysis: Secondary | ICD-10-CM | POA: Diagnosis not present

## 2023-11-08 DIAGNOSIS — N186 End stage renal disease: Secondary | ICD-10-CM | POA: Diagnosis not present

## 2023-11-09 DIAGNOSIS — N186 End stage renal disease: Secondary | ICD-10-CM | POA: Diagnosis not present

## 2023-11-09 DIAGNOSIS — Z992 Dependence on renal dialysis: Secondary | ICD-10-CM | POA: Diagnosis not present

## 2023-11-12 DIAGNOSIS — Z992 Dependence on renal dialysis: Secondary | ICD-10-CM | POA: Diagnosis not present

## 2023-11-12 DIAGNOSIS — N186 End stage renal disease: Secondary | ICD-10-CM | POA: Diagnosis not present

## 2023-11-14 DIAGNOSIS — Z992 Dependence on renal dialysis: Secondary | ICD-10-CM | POA: Diagnosis not present

## 2023-11-14 DIAGNOSIS — N186 End stage renal disease: Secondary | ICD-10-CM | POA: Diagnosis not present

## 2023-11-16 DIAGNOSIS — N186 End stage renal disease: Secondary | ICD-10-CM | POA: Diagnosis not present

## 2023-11-16 DIAGNOSIS — Z992 Dependence on renal dialysis: Secondary | ICD-10-CM | POA: Diagnosis not present

## 2023-11-19 DIAGNOSIS — N250 Renal osteodystrophy: Secondary | ICD-10-CM | POA: Diagnosis not present

## 2023-11-19 DIAGNOSIS — N186 End stage renal disease: Secondary | ICD-10-CM | POA: Diagnosis not present

## 2023-11-19 DIAGNOSIS — Z992 Dependence on renal dialysis: Secondary | ICD-10-CM | POA: Diagnosis not present

## 2023-11-21 DIAGNOSIS — Z992 Dependence on renal dialysis: Secondary | ICD-10-CM | POA: Diagnosis not present

## 2023-11-21 DIAGNOSIS — N186 End stage renal disease: Secondary | ICD-10-CM | POA: Diagnosis not present

## 2023-11-23 DIAGNOSIS — N186 End stage renal disease: Secondary | ICD-10-CM | POA: Diagnosis not present

## 2023-11-23 DIAGNOSIS — Z992 Dependence on renal dialysis: Secondary | ICD-10-CM | POA: Diagnosis not present

## 2023-11-26 DIAGNOSIS — Z992 Dependence on renal dialysis: Secondary | ICD-10-CM | POA: Diagnosis not present

## 2023-11-26 DIAGNOSIS — N186 End stage renal disease: Secondary | ICD-10-CM | POA: Diagnosis not present

## 2023-11-28 ENCOUNTER — Other Ambulatory Visit: Payer: Self-pay | Admitting: *Deleted

## 2023-11-28 DIAGNOSIS — D631 Anemia in chronic kidney disease: Secondary | ICD-10-CM

## 2023-11-28 DIAGNOSIS — Z992 Dependence on renal dialysis: Secondary | ICD-10-CM | POA: Diagnosis not present

## 2023-11-28 DIAGNOSIS — N186 End stage renal disease: Secondary | ICD-10-CM | POA: Diagnosis not present

## 2023-11-28 NOTE — Progress Notes (Signed)
 [facility]  [phone]) [phone] [phone]) [phone]  ID: Jennifer Carrillo Blush OB: 1933-04-01  MR#: [id number]  RDW#:[id number]  Patient Care Team: Kristina Tinnie [name] as PCP - General (Physician Assistant) Nicholaus Sherlean Carrillo, [facility] (Inactive) as Pharmacist (Pharmacist) Rennie Cindy SAUNDERS, MD as Consulting Physician (Oncology)  REFERRING PROVIDER: Tinnie Kristina  REASON FOR REFERRAL: anemia of chronic disease  HPI: Jennifer Carrillo is a 89 y.o. female with past medical history of hypertension, hyperlipidemia, IDA, COPD, diabetes, left breast cancer, OSA not on CPAP, stroke, ESRD on HD was referred to hematology for normocytic anemia.  Patient has history of ESRD on HD Tuesday Thursday and Saturday regimen.  Follows with Dr. Dennise as outpatient.  She has been getting EPO injection and iron  infusions per nephrology.  Labs from June 2024 showed hemoglobin of 8.1, WBC 6.3 and platelet 300.  Iron  32, saturation 12% and ferritin of 273, B12 523, folate 15.8, TSH 2.25.  Patient has history of left breast cancer.  Oncology history obtained from Dr. Evalene Charter [facility] heme-onc.  Last seen in March 2021.  Oncology History Overview Note  11/10/15: Mammogram with 2 cm left breast abnormality; US  confirmed. Axillary negative.  12/07/15: Bx : Breast, left, 6 o'clock, core biopsy  - Invasive ductal carcinoma - Nottingham combined histologic grade: 1 Tubule formation: 2 Nuclear grade: 2 Mitotic score: 1 - Invasive carcinoma measures approximately 9 mm in this core biopsy specimen - Microcalcifications are associated with invasive carcinoma - ER 100%; PR 1-2%; Her 2+, FISH negative   12/20/15: Partial Mastectomy A: Breast, left savi guided partial mastectomy -Invasive ductal carcinoma with associated ductal carcinoma in situ (DCIS) (see synoptic) -Invasive carcinoma measures 14.0 mm in greatest dimension -Margin status in part A only (correlate with additional  parts to determine final margin status)      - Adenocarcinoma is greater than 2 mm from all surgical margins      - DCIS is focally positive at the posterior black-inked surgical margin; DCIS is greater than 2 mm from all other surgical margins -Ancillary studies previously reported on       - Estrogen receptor: Positive, 91-100%, strong      - Progesterone receptor: Margins positive, 1-10%, moderate      - HER2/Neu: 2+ equivocal FISH results HER2/Neu: Not amplified -See comment   B: Breast, left, posterior margin, excision - Ductal carcinoma in-situ present, 6.0 mm from the black-inked surgical margin - Negative for invasive carcinoma   C: Breast, left, medial margin, excision - Negative for in-situ and invasive carcinoma   D: Breast, left, inferior margin, excision - Negative for in-situ and invasive carcinoma   E: Breast, left, lateral margin, excision - Negative for in-situ and invasive carcinoma   F: Breast, left, superior margin, excision - Ductal carcinoma in-situ is 1.7 mm from the blue-inked surgical margin - Negative for invasive carcinoma  Malignant neoplasm of left female breast (CMS-HCC)  12/16/2015 Initial Diagnosis  Malignant neoplasm of breast in female, estrogen receptor positive (RAF-HCC)   On anastrozole  1 mg daily  2 sons, 1 daughter (2 died)    INTERVAL HISTORY: Jennifer Carrillo is a 89 y.o. female currently receiving hemodialysis with Dr. Dennise who returns to clinic for possible transfusion.  Patient previously has a history of melena and AVMs and underwent an enteroscopy with Dr. Unk on 07/24/2023.  In the interim, she has been receiving IV iron  infusions.  Continues dialysis 3 days a week.  She says that she feels  well today and her energy improved.  Today she is seen sitting in wheelchair by herself no acute distress.  She reports overall doing well.  Hemoglobin is within normal limits today.  She reports having good energy.  Denies seeing any blood in  urine sputum or stool.  She was wearing a brace on her left knee.  She reports that for the past couple weeks her left knee has been hurting and it affects the way she is able to move.  There is no warmth, tenderness, swelling of the left knee today.  Likely arthritis.  Advised her to apply some over-the-counter Biofreeze, heating pad for 15 minutes at a time.  We also will send her to orthopedics as well.  She is in agreement with plan of care.  Since she is not getting any iron  today, we will bring her back in 4 weeks for repeat labs just to make sure her hemoglobin remains stable.   REVIEW OF SYSTEMS:   Review of Systems  Unable to perform ROS: Dementia  Constitutional:  Positive for malaise/fatigue.  HENT:  Negative for nosebleeds.   Respiratory:  Negative for shortness of breath.   Cardiovascular:  Negative for chest pain.  Gastrointestinal:  Negative for blood in stool and melena.  Genitourinary:  Negative for dysuria and urgency.  Musculoskeletal:  Positive for joint pain.  Neurological:  Negative for weakness.  Psychiatric/Behavioral:  Positive for memory loss.   As per HPI. Otherwise, a complete review of systems is negative.  PAST MEDICAL HISTORY: Past Medical History:  Diagnosis Date   Arthritis    Asthma    Calf pain    Cancer (HCC) 2016   left breast   COPD (chronic obstructive pulmonary disease) (HCC)    Diabetes (HCC)    Discoid lupus    Gastro-esophageal reflux    Glaucoma    Hyperlipemia    Hypertension    Iron  deficiency anemia    Joint pain    Leg swelling    Osteoarthritis    PVD (peripheral vascular disease)    Sinus problem    Sleep apnea    No CPAP/ Can't tolerate   Stroke (HCC) 1987    PAST SURGICAL HISTORY: Past Surgical History:  Procedure Laterality Date   BREAST SURGERY Left    left lumpectomy   CAROTID ANGIOGRAPHY Left 06/25/2018   Procedure: CAROTID ANGIOGRAPHY, possible intervention;  Surgeon: Jama Cordella MATSU, MD;  Location: [facility]  INVASIVE CV LAB;  Service: Cardiovascular;  Laterality: Left;   CAROTID ENDARTERECTOMY Right    CAROTID PTA/STENT INTERVENTION Left 06/25/2018   Procedure: CAROTID PTA/STENT INTERVENTION;  Surgeon: Jama Cordella MATSU, MD;  Location: [facility] INVASIVE CV LAB;  Service: Cardiovascular;  Laterality: Left;   CATARACT EXTRACTION Right 2013   CORONARY STENT PLACEMENT     L. L. E.   DIALYSIS/PERMA CATHETER INSERTION N/A 12/29/2020   Procedure: DIALYSIS/PERMA CATHETER INSERTION;  Surgeon: Marea Selinda RAMAN, MD;  Location: [facility] INVASIVE CV LAB;  Service: Cardiovascular;  Laterality: N/A;   DIALYSIS/PERMA CATHETER INSERTION N/A 12/14/2021   Procedure: DIALYSIS/PERMA CATHETER INSERTION;  Surgeon: Jama Cordella MATSU, MD;  Location: [facility] INVASIVE CV LAB;  Service: Cardiovascular;  Laterality: N/A;   ENTEROSCOPY N/A 07/24/2023   Procedure: ENTEROSCOPY;  Surgeon: Unk Corinn Skiff, MD;  Location: [facility] ENDOSCOPY;  Service: Gastroenterology;  Laterality: N/A;   ESOPHAGOGASTRODUODENOSCOPY N/A 05/28/2023   Procedure: EGD (ESOPHAGOGASTRODUODENOSCOPY);  Surgeon: Jinny Carmine, MD;  Location: [facility] ENDOSCOPY;  Service: Endoscopy;  Laterality: N/A;   EYE SURGERY  Bilateral    cataract   HOT HEMOSTASIS  05/28/2023   Procedure: EGD, WITH ARGON PLASMA COAGULATION;  Surgeon: Jinny Carmine, MD;  Location: [facility] ENDOSCOPY;  Service: Endoscopy;;   HOT HEMOSTASIS  07/24/2023   Procedure: EGD, WITH ARGON PLASMA COAGULATION;  Surgeon: Unk Corinn Skiff, MD;  Location: [facility] ENDOSCOPY;  Service: Gastroenterology;;   LEFT HEART CATH AND CORONARY ANGIOGRAPHY Left 05/06/2018   Procedure: LEFT HEART CATH AND CORONARY ANGIOGRAPHY;  Surgeon: Hester Wolm PARAS, MD;  Location: [facility] INVASIVE CV LAB;  Service: Cardiovascular;  Laterality: Left;   LOWER EXTREMITY ANGIOGRAPHY Right 01/22/2018   Procedure: LOWER EXTREMITY ANGIOGRAPHY;  Surgeon: Jama Cordella MATSU, MD;  Location: [facility] INVASIVE CV LAB;  Service: Cardiovascular;  Laterality: Right;   RENAL  ANGIOGRAPHY Left 02/26/2018   Procedure: RENAL ANGIOGRAPHY;  Surgeon: Jama Cordella MATSU, MD;  Location: [facility] INVASIVE CV LAB;  Service: Cardiovascular;  Laterality: Left;   STENT PLACEMENT VASCULAR ([facility] HX) Left    stent placement on LLE   SUBMUCOSAL INJECTION  07/24/2023   Procedure: INJECTION, SUBMUCOSAL;  Surgeon: Unk Corinn Skiff, MD;  Location: [facility] ENDOSCOPY;  Service: Gastroenterology;;   TUBAL LIGATION     FAMILY HISTORY: Family History  Problem Relation Age of Onset   Heart disease Mother    Diabetes Sister    Leukemia Daughter    Lung cancer Brother    Diabetes Other    Hypertension Other    Bladder Cancer Neg Hx    Kidney disease Neg Hx    Prostate cancer Neg Hx    Breast cancer Neg Hx    HEALTH MAINTENANCE: Social History   Tobacco Use   Smoking status: Former    Current packs/day: 0.00    Types: Cigarettes    Quit date: 1987    Years since quitting: 38.9   Smokeless tobacco: Never   Tobacco comments:    quit 31 years  Vaping Use   Vaping status: Never Used  Substance Use Topics   Alcohol use: No   Drug use: No   Allergies  Allergen Reactions   Benazepril Nausea Only   Statins Other (See Comments)    Myalgia  swelling    Outside Source Comment: swelling   Current Outpatient Medications  Medication Sig Dispense Refill   ACCU-CHEK GUIDE test strip TEST BLOOD SUGAR THREE TIMES DAILY  AS  DIRECTED 300 strip 1   Accu-Chek Softclix Lancets lancets USE AS INSTRUCTED 3 TIMES A DAY 300 each 3   acetaminophen  (TYLENOL ) 500 MG tablet Take 1 tablet (500 mg total) by mouth every 6 (six) hours as needed for moderate pain or headache. 30 tablet 0   albuterol  (VENTOLIN  HFA) 108 (90 Base) MCG/ACT inhaler Inhale 2 puffs into the lungs every 6 (six) hours as needed for wheezing or shortness of breath. 8 g 2   Alcohol Swabs (B-D SINGLE USE SWABS REGULAR) PADS Use as directed for 3 times daily DX E11.65 100 each 1   amLODipine  (NORVASC ) 5 MG tablet Skip the dose if  systolic BP less than 140 mmHg 30 tablet 11   Blood Glucose Monitoring Suppl (ACCU-CHEK GUIDE ME) w/Device KIT Use as instructed. DX e11.65 1 kit 0   budesonide -glycopyrrolate -formoterol  (BREZTRI  AEROSPHERE) 160-9-4.8 MCG/ACT AERO inhaler Inhale 2 puffs into the lungs 2 (two) times daily. 10.7 g 11   Dextromethorphan -guaiFENesin  20-200 MG/20ML LIQD Take 10 mLs by mouth every 6 (six) hours as needed. 118 mL 0   donepezil  (ARICEPT ) 10 MG tablet TAKE 1 TABLET AT  BEDTIME FOR MEMORY (NEED MD APPOINTMENT) 90 tablet 3   ferrous sulfate  325 (65 FE) MG tablet Take 1 tablet (325 mg total) by mouth daily with breakfast. 30 tablet 2   hydrALAZINE  (APRESOLINE ) 100 MG tablet Take 100 mg by mouth 2 (two) times daily.     QUEtiapine  (SEROQUEL ) 50 MG tablet Take 1 tablet (50 mg total) by mouth every evening. 30 tablet 0   torsemide (DEMADEX) 100 MG tablet Take 100 mg by mouth daily.     Vitamin D , Ergocalciferol , (DRISDOL ) 1.25 MG (50000 UNIT) CAPS capsule Take 1 capsule (50,000 Units total) by mouth every 7 (seven) days. 4 capsule 2   No current facility-administered medications for this visit.   OBJECTIVE: Vitals:   11/29/23 0921 11/29/23 0957  BP: (!) 147/57 (!) 154/60  Pulse: 64   Temp: (!) 97 F (36.1 C)   SpO2: 100%       Body mass index is 25.33 kg/m.      General: Well-developed, well-nourished, no acute distress. Eyes: Pink conjunctiva, anicteric sclera. HEENT: Normocephalic, moist mucous membranes, clear oropharnyx. Lungs: Clear to auscultation bilaterally. Heart: Regular rate and rhythm. No rubs, murmurs, or gallops. Abdomen: Soft, nontender, nondistended. No organomegaly noted, normoactive bowel sounds. Musculoskeletal: No edema, cyanosis, or clubbing. Neuro: Alert. Dementia at baseline.  Skin: No rashes or petechiae noted. Psych: Normal affect. Lymphatics: No cervical, calvicular, axillary or inguinal LAD.   LAB RESULTS: Lab Results  Component Value Date   NA 140 11/29/2023   K  4.5 11/29/2023   CL 105 11/29/2023   CO2 22 11/29/2023   GLUCOSE 88 11/29/2023   BUN 48 (H) 11/29/2023   CREATININE 5.60 (H) 11/29/2023   CALCIUM  8.9 11/29/2023   PROT 7.5 11/29/2023   ALBUMIN 3.9 11/29/2023   AST 20 11/29/2023   ALT 16 11/29/2023   ALKPHOS 60 11/29/2023   BILITOT 0.8 11/29/2023   GFRNONAA 7 (L) 11/29/2023   GFRAA 31 (L) 02/13/2019   Lab Results  Component Value Date   WBC 4.6 11/29/2023   NEUTROABS 2.0 11/29/2023   HGB 12.5 11/29/2023   HCT 39.9 11/29/2023   MCV 84.2 11/29/2023   PLT 208 11/29/2023   Lab Results  Component Value Date   TIBC 255 09/26/2023   TIBC 344 07/19/2023   TIBC 385 05/25/2023   FERRITIN 339 (H) 09/26/2023   FERRITIN 136 07/19/2023   FERRITIN 130 05/25/2023   IRONPCTSAT 18 09/26/2023   IRONPCTSAT 10 (L) 07/19/2023   IRONPCTSAT 10 (L) 05/25/2023   STUDIES: No results found.  ASSESSMENT AND PLAN:   NATE PERRI is a 89 y.o. female with pmh of hypertension, hyperlipidemia, IDA, COPD, diabetes, left breast cancer, OSA not on CPAP, stroke, ESRD on HD was referred to hematology for normocytic anemia.  # Iron  deficiency anemia- chronic anemia of CKD, stage V, on hemodialysis and iron  deficiency anemia due to chronic blood loss in setting of gastric and small bowel AVMs.  In May 2025 she presented to [facility] ER with melena and found to have a hemoglobin of 6.7.  She underwent EGD which found multiple AVMs in the duodenum, treated with APC.  Responded to transfusion and hemoglobin was 9.4 at time of discharge.  Hemoglobin dropped to 7.6 with dialysis and she was referred back to GI for further evaluation.  She is unsure if she has been receiving erythropoietin  during dialysis. - Goal ferritin around 500 in the setting of ESRD per Dr Clista - may consider long term octreotide to  reduce risk of bleeding from small bowel AVMs, however Hg stable at this time - s/p enteroscopy with Dr. Unk- 1 single bleeding angioectasia treated with APC.  Otherwise normal.  - Last iron  infusion 9/11.  Hg 12.5 today hold venofer  ferritin and iron  panel pending  # Symptomatic anemia- s/p transfusion on 08/12/23 for hmg 6.5.  - Symptomatically improved. Hmg 12.5 today - Hold transfusion. Plan to check counts monthly. Transfuse for hmg < 7.   # ESRD on HD  - Patient follows with Dr. Dennise nephrology as outpatient.  She is on dialysis Tuesday Thursday and Saturday. Patient has been getting EPO injections and IV iron  infusions per nephrology.  We had previously deferred her hematology management to nephrology since she was on HD however, given drops in her counts due to AVMs/blood loss, she was re-referred.  - Discussed the need to optimize iron  stores in the setting of end-stage renal disease.  - F/U with us  in 4 weeks for repeat labs and possible venofer  If counts drop, we can see her sooner.   # History of left breast cancer, stage Ia, ER 91 to 100% positive, 1 to 10% positive and HER2 negative. - Diagnosed in 2017.  Status post left lumpectomy at [facility].  Pathology showing IDC, 14 mm, margins negative for carcinoma, DCIS focally present at the posterior black inked margin.  ER 91 to 100% positive, PR 1 to 10% and HER2 negative by FISH.  She was treated by Dr. Evalene Charter heme-onc at [facility].  Last followed up in March 2021.  She is on anastrozole  1 mg daily since beginning of 2018.  Discussed with the patient that she had early stage hormone positive breast cancer with negative margins for carcinoma.  5 years of anastrozole  should be sufficient.  She has compelted 7 years of anastrozole . D/c d/t bone effects. - Last mammogram was in September 2024 was negative. Plan to repeat September 2025.   Pain in left knee: Patient with compression sleeve on knee today in clinic.  Reports aching pain and stiffness in left knee for a few weeks.  No falls or injuries.  No warmth, tenderness with palpation, no edema.  Likely arthritis.  Instructed her to apply biofreeze  on it daily, she can apply a heating pad for comfort and referred to orthopedics.  Patient reports that the pain is making it hard for her to get up and down.  Disposition:  F/u with orthopedics for left knee pain  No iron  or Blood today F/U in 4 weeks labs possible venofer  or blood  Patient expressed understanding and was in agreement with this plan. She also understands that She can call clinic at any time with any questions, concerns, or complaints.   Morna Husband, NP   11/29/2023   CC: Dr Unk

## 2023-11-29 ENCOUNTER — Other Ambulatory Visit

## 2023-11-29 ENCOUNTER — Inpatient Hospital Stay

## 2023-11-29 ENCOUNTER — Ambulatory Visit

## 2023-11-29 ENCOUNTER — Inpatient Hospital Stay: Attending: Nurse Practitioner

## 2023-11-29 ENCOUNTER — Inpatient Hospital Stay: Admitting: Nurse Practitioner

## 2023-11-29 ENCOUNTER — Encounter: Payer: Self-pay | Admitting: Nurse Practitioner

## 2023-11-29 ENCOUNTER — Ambulatory Visit: Admitting: Internal Medicine

## 2023-11-29 VITALS — BP 154/60 | HR 64 | Temp 97.0°F | Ht 67.0 in | Wt 161.7 lb

## 2023-11-29 DIAGNOSIS — N184 Chronic kidney disease, stage 4 (severe): Secondary | ICD-10-CM

## 2023-11-29 DIAGNOSIS — Z79899 Other long term (current) drug therapy: Secondary | ICD-10-CM | POA: Insufficient documentation

## 2023-11-29 DIAGNOSIS — I120 Hypertensive chronic kidney disease with stage 5 chronic kidney disease or end stage renal disease: Secondary | ICD-10-CM | POA: Diagnosis not present

## 2023-11-29 DIAGNOSIS — Z9841 Cataract extraction status, right eye: Secondary | ICD-10-CM | POA: Insufficient documentation

## 2023-11-29 DIAGNOSIS — J4489 Other specified chronic obstructive pulmonary disease: Secondary | ICD-10-CM | POA: Insufficient documentation

## 2023-11-29 DIAGNOSIS — Z806 Family history of leukemia: Secondary | ICD-10-CM | POA: Diagnosis not present

## 2023-11-29 DIAGNOSIS — N186 End stage renal disease: Secondary | ICD-10-CM | POA: Insufficient documentation

## 2023-11-29 DIAGNOSIS — Z833 Family history of diabetes mellitus: Secondary | ICD-10-CM | POA: Diagnosis not present

## 2023-11-29 DIAGNOSIS — K5521 Angiodysplasia of colon with hemorrhage: Secondary | ICD-10-CM | POA: Diagnosis not present

## 2023-11-29 DIAGNOSIS — Z9851 Tubal ligation status: Secondary | ICD-10-CM | POA: Insufficient documentation

## 2023-11-29 DIAGNOSIS — Z87891 Personal history of nicotine dependence: Secondary | ICD-10-CM | POA: Diagnosis not present

## 2023-11-29 DIAGNOSIS — Z9010 Acquired absence of unspecified breast and nipple: Secondary | ICD-10-CM | POA: Insufficient documentation

## 2023-11-29 DIAGNOSIS — E1122 Type 2 diabetes mellitus with diabetic chronic kidney disease: Secondary | ICD-10-CM | POA: Diagnosis not present

## 2023-11-29 DIAGNOSIS — Z801 Family history of malignant neoplasm of trachea, bronchus and lung: Secondary | ICD-10-CM | POA: Diagnosis not present

## 2023-11-29 DIAGNOSIS — Z853 Personal history of malignant neoplasm of breast: Secondary | ICD-10-CM | POA: Insufficient documentation

## 2023-11-29 DIAGNOSIS — D500 Iron deficiency anemia secondary to blood loss (chronic): Secondary | ICD-10-CM | POA: Diagnosis not present

## 2023-11-29 DIAGNOSIS — Z8249 Family history of ischemic heart disease and other diseases of the circulatory system: Secondary | ICD-10-CM | POA: Insufficient documentation

## 2023-11-29 DIAGNOSIS — M25562 Pain in left knee: Secondary | ICD-10-CM

## 2023-11-29 DIAGNOSIS — M1990 Unspecified osteoarthritis, unspecified site: Secondary | ICD-10-CM | POA: Diagnosis not present

## 2023-11-29 DIAGNOSIS — D631 Anemia in chronic kidney disease: Secondary | ICD-10-CM | POA: Insufficient documentation

## 2023-11-29 DIAGNOSIS — Z888 Allergy status to other drugs, medicaments and biological substances status: Secondary | ICD-10-CM | POA: Insufficient documentation

## 2023-11-29 DIAGNOSIS — Z8673 Personal history of transient ischemic attack (TIA), and cerebral infarction without residual deficits: Secondary | ICD-10-CM | POA: Diagnosis not present

## 2023-11-29 DIAGNOSIS — K31811 Angiodysplasia of stomach and duodenum with bleeding: Secondary | ICD-10-CM | POA: Diagnosis not present

## 2023-11-29 DIAGNOSIS — Z992 Dependence on renal dialysis: Secondary | ICD-10-CM | POA: Insufficient documentation

## 2023-11-29 DIAGNOSIS — D509 Iron deficiency anemia, unspecified: Secondary | ICD-10-CM | POA: Diagnosis not present

## 2023-11-29 LAB — CMP (CANCER CENTER ONLY)
ALT: 16 U/L (ref 0–44)
AST: 20 U/L (ref 15–41)
Albumin: 3.9 g/dL (ref 3.5–5.0)
Alkaline Phosphatase: 60 U/L (ref 38–126)
Anion gap: 13 (ref 5–15)
BUN: 48 mg/dL — ABNORMAL HIGH (ref 8–23)
CO2: 22 mmol/L (ref 22–32)
Calcium: 8.9 mg/dL (ref 8.9–10.3)
Chloride: 105 mmol/L (ref 98–111)
Creatinine: 5.6 mg/dL — ABNORMAL HIGH (ref 0.44–1.00)
GFR, Estimated: 7 mL/min — ABNORMAL LOW (ref >60)
Glucose, Bld: 88 mg/dL (ref 70–99)
Potassium: 4.5 mmol/L (ref 3.5–5.1)
Sodium: 140 mmol/L (ref 135–145)
Total Bilirubin: 0.8 mg/dL (ref 0.0–1.2)
Total Protein: 7.5 g/dL (ref 6.5–8.1)

## 2023-11-29 LAB — CBC WITH DIFFERENTIAL (CANCER CENTER ONLY)
Abs Immature Granulocytes: 0.01 K/uL (ref 0.00–0.07)
Basophils Absolute: 0.1 K/uL (ref 0.0–0.1)
Basophils Relative: 1 %
Eosinophils Absolute: 0.6 K/uL — ABNORMAL HIGH (ref 0.0–0.5)
Eosinophils Relative: 13 %
HCT: 39.9 % (ref 36.0–46.0)
Hemoglobin: 12.5 g/dL (ref 12.0–15.0)
Immature Granulocytes: 0 %
Lymphocytes Relative: 33 %
Lymphs Abs: 1.5 K/uL (ref 0.7–4.0)
MCH: 26.4 pg (ref 26.0–34.0)
MCHC: 31.3 g/dL (ref 30.0–36.0)
MCV: 84.2 fL (ref 80.0–100.0)
Monocytes Absolute: 0.5 K/uL (ref 0.1–1.0)
Monocytes Relative: 10 %
Neutro Abs: 2 K/uL (ref 1.7–7.7)
Neutrophils Relative %: 43 %
Platelet Count: 208 K/uL (ref 150–400)
RBC: 4.74 MIL/uL (ref 3.87–5.11)
RDW: 19.9 % — ABNORMAL HIGH (ref 11.5–15.5)
WBC Count: 4.6 K/uL (ref 4.0–10.5)
nRBC: 0 % (ref 0.0–0.2)

## 2023-11-29 LAB — SAMPLE TO BLOOD BANK

## 2023-11-29 LAB — IRON AND TIBC
Iron: 51 ug/dL (ref 28–170)
Saturation Ratios: 19 % (ref 10.4–31.8)
TIBC: 270 ug/dL (ref 250–450)
UIBC: 220 ug/dL

## 2023-11-29 LAB — FERRITIN: Ferritin: 439 ng/mL — ABNORMAL HIGH (ref 11–307)

## 2023-11-29 NOTE — Progress Notes (Signed)
 Per NP pt does not iron  today. Treatment team updated. All follow ups as scheduled.   Yarah Fuente

## 2023-11-29 NOTE — Patient Instructions (Signed)
 Apply BioFreeze on the left knee Follow-up with orthopedics. Apply Heat as needed (15 mins application, followed by 20 mins rest without heat, repeat as needed). Do not apply heat immediately after using biofreeze to avoid skin burns.

## 2023-11-30 DIAGNOSIS — Z992 Dependence on renal dialysis: Secondary | ICD-10-CM | POA: Diagnosis not present

## 2023-11-30 DIAGNOSIS — N186 End stage renal disease: Secondary | ICD-10-CM | POA: Diagnosis not present

## 2023-12-02 ENCOUNTER — Inpatient Hospital Stay

## 2023-12-03 DIAGNOSIS — Z992 Dependence on renal dialysis: Secondary | ICD-10-CM | POA: Diagnosis not present

## 2023-12-03 DIAGNOSIS — N186 End stage renal disease: Secondary | ICD-10-CM | POA: Diagnosis not present

## 2023-12-05 DIAGNOSIS — N186 End stage renal disease: Secondary | ICD-10-CM | POA: Diagnosis not present

## 2023-12-05 DIAGNOSIS — Z992 Dependence on renal dialysis: Secondary | ICD-10-CM | POA: Diagnosis not present

## 2023-12-07 DIAGNOSIS — N186 End stage renal disease: Secondary | ICD-10-CM | POA: Diagnosis not present

## 2023-12-07 DIAGNOSIS — Z992 Dependence on renal dialysis: Secondary | ICD-10-CM | POA: Diagnosis not present

## 2023-12-08 DIAGNOSIS — Z992 Dependence on renal dialysis: Secondary | ICD-10-CM | POA: Diagnosis not present

## 2023-12-08 DIAGNOSIS — N186 End stage renal disease: Secondary | ICD-10-CM | POA: Diagnosis not present

## 2023-12-17 DIAGNOSIS — D509 Iron deficiency anemia, unspecified: Secondary | ICD-10-CM | POA: Diagnosis not present

## 2023-12-17 DIAGNOSIS — N250 Renal osteodystrophy: Secondary | ICD-10-CM | POA: Diagnosis not present

## 2023-12-17 DIAGNOSIS — Z992 Dependence on renal dialysis: Secondary | ICD-10-CM | POA: Diagnosis not present

## 2023-12-17 DIAGNOSIS — N186 End stage renal disease: Secondary | ICD-10-CM | POA: Diagnosis not present

## 2023-12-17 DIAGNOSIS — E119 Type 2 diabetes mellitus without complications: Secondary | ICD-10-CM | POA: Diagnosis not present

## 2023-12-17 DIAGNOSIS — Z794 Long term (current) use of insulin: Secondary | ICD-10-CM | POA: Diagnosis not present

## 2023-12-27 ENCOUNTER — Encounter: Payer: Self-pay | Admitting: Nurse Practitioner

## 2023-12-27 ENCOUNTER — Inpatient Hospital Stay: Attending: Nurse Practitioner

## 2023-12-27 ENCOUNTER — Inpatient Hospital Stay: Admitting: Nurse Practitioner

## 2023-12-27 VITALS — BP 155/59 | HR 65 | Temp 98.6°F | Resp 20 | Wt 169.7 lb

## 2023-12-27 DIAGNOSIS — D509 Iron deficiency anemia, unspecified: Secondary | ICD-10-CM

## 2023-12-27 DIAGNOSIS — Z79899 Other long term (current) drug therapy: Secondary | ICD-10-CM | POA: Diagnosis not present

## 2023-12-27 DIAGNOSIS — Z8249 Family history of ischemic heart disease and other diseases of the circulatory system: Secondary | ICD-10-CM | POA: Diagnosis not present

## 2023-12-27 DIAGNOSIS — R413 Other amnesia: Secondary | ICD-10-CM | POA: Diagnosis not present

## 2023-12-27 DIAGNOSIS — K31811 Angiodysplasia of stomach and duodenum with bleeding: Secondary | ICD-10-CM | POA: Insufficient documentation

## 2023-12-27 DIAGNOSIS — Z9010 Acquired absence of unspecified breast and nipple: Secondary | ICD-10-CM | POA: Diagnosis not present

## 2023-12-27 DIAGNOSIS — Z806 Family history of leukemia: Secondary | ICD-10-CM | POA: Diagnosis not present

## 2023-12-27 DIAGNOSIS — Z8673 Personal history of transient ischemic attack (TIA), and cerebral infarction without residual deficits: Secondary | ICD-10-CM | POA: Insufficient documentation

## 2023-12-27 DIAGNOSIS — D500 Iron deficiency anemia secondary to blood loss (chronic): Secondary | ICD-10-CM

## 2023-12-27 DIAGNOSIS — Z801 Family history of malignant neoplasm of trachea, bronchus and lung: Secondary | ICD-10-CM | POA: Diagnosis not present

## 2023-12-27 DIAGNOSIS — N186 End stage renal disease: Secondary | ICD-10-CM | POA: Insufficient documentation

## 2023-12-27 DIAGNOSIS — E611 Iron deficiency: Secondary | ICD-10-CM | POA: Diagnosis not present

## 2023-12-27 DIAGNOSIS — M25562 Pain in left knee: Secondary | ICD-10-CM | POA: Diagnosis not present

## 2023-12-27 DIAGNOSIS — M2550 Pain in unspecified joint: Secondary | ICD-10-CM | POA: Insufficient documentation

## 2023-12-27 DIAGNOSIS — Z833 Family history of diabetes mellitus: Secondary | ICD-10-CM | POA: Diagnosis not present

## 2023-12-27 DIAGNOSIS — Z87891 Personal history of nicotine dependence: Secondary | ICD-10-CM | POA: Insufficient documentation

## 2023-12-27 DIAGNOSIS — Z888 Allergy status to other drugs, medicaments and biological substances status: Secondary | ICD-10-CM | POA: Insufficient documentation

## 2023-12-27 DIAGNOSIS — E1122 Type 2 diabetes mellitus with diabetic chronic kidney disease: Secondary | ICD-10-CM | POA: Diagnosis not present

## 2023-12-27 DIAGNOSIS — J4489 Other specified chronic obstructive pulmonary disease: Secondary | ICD-10-CM | POA: Diagnosis not present

## 2023-12-27 DIAGNOSIS — Z992 Dependence on renal dialysis: Secondary | ICD-10-CM | POA: Insufficient documentation

## 2023-12-27 DIAGNOSIS — K5521 Angiodysplasia of colon with hemorrhage: Secondary | ICD-10-CM | POA: Diagnosis not present

## 2023-12-27 DIAGNOSIS — D631 Anemia in chronic kidney disease: Secondary | ICD-10-CM | POA: Diagnosis present

## 2023-12-27 DIAGNOSIS — Z853 Personal history of malignant neoplasm of breast: Secondary | ICD-10-CM | POA: Diagnosis not present

## 2023-12-27 DIAGNOSIS — R5383 Other fatigue: Secondary | ICD-10-CM | POA: Insufficient documentation

## 2023-12-27 DIAGNOSIS — Z9841 Cataract extraction status, right eye: Secondary | ICD-10-CM | POA: Diagnosis not present

## 2023-12-27 LAB — CBC WITH DIFFERENTIAL (CANCER CENTER ONLY)
Abs Immature Granulocytes: 0.02 K/uL (ref 0.00–0.07)
Basophils Absolute: 0.1 K/uL (ref 0.0–0.1)
Basophils Relative: 1 %
Eosinophils Absolute: 0.3 K/uL (ref 0.0–0.5)
Eosinophils Relative: 6 %
HCT: 39.4 % (ref 36.0–46.0)
Hemoglobin: 12.2 g/dL (ref 12.0–15.0)
Immature Granulocytes: 0 %
Lymphocytes Relative: 20 %
Lymphs Abs: 0.9 K/uL (ref 0.7–4.0)
MCH: 26 pg (ref 26.0–34.0)
MCHC: 31 g/dL (ref 30.0–36.0)
MCV: 83.8 fL (ref 80.0–100.0)
Monocytes Absolute: 0.4 K/uL (ref 0.1–1.0)
Monocytes Relative: 9 %
Neutro Abs: 2.9 K/uL (ref 1.7–7.7)
Neutrophils Relative %: 64 %
Platelet Count: 216 K/uL (ref 150–400)
RBC: 4.7 MIL/uL (ref 3.87–5.11)
RDW: 19.2 % — ABNORMAL HIGH (ref 11.5–15.5)
WBC Count: 4.5 K/uL (ref 4.0–10.5)
nRBC: 0 % (ref 0.0–0.2)

## 2023-12-27 LAB — IRON AND TIBC
Iron: 41 ug/dL (ref 28–170)
Saturation Ratios: 16 % (ref 10.4–31.8)
TIBC: 259 ug/dL (ref 250–450)
UIBC: 218 ug/dL

## 2023-12-27 LAB — SAMPLE TO BLOOD BANK

## 2023-12-27 LAB — CMP (CANCER CENTER ONLY)
ALT: 39 U/L (ref 0–44)
AST: 28 U/L (ref 15–41)
Albumin: 4.2 g/dL (ref 3.5–5.0)
Alkaline Phosphatase: 105 U/L (ref 38–126)
Anion gap: 15 (ref 5–15)
BUN: 37 mg/dL — ABNORMAL HIGH (ref 8–23)
CO2: 24 mmol/L (ref 22–32)
Calcium: 9.3 mg/dL (ref 8.9–10.3)
Chloride: 105 mmol/L (ref 98–111)
Creatinine: 5.1 mg/dL — ABNORMAL HIGH (ref 0.44–1.00)
GFR, Estimated: 8 mL/min — ABNORMAL LOW
Glucose, Bld: 112 mg/dL — ABNORMAL HIGH (ref 70–99)
Potassium: 3.9 mmol/L (ref 3.5–5.1)
Sodium: 144 mmol/L (ref 135–145)
Total Bilirubin: 0.6 mg/dL (ref 0.0–1.2)
Total Protein: 7.5 g/dL (ref 6.5–8.1)

## 2023-12-27 LAB — FERRITIN: Ferritin: 509 ng/mL — ABNORMAL HIGH (ref 11–307)

## 2023-12-27 NOTE — Progress Notes (Signed)
 " [facility]  [phone] [phone]  ID: Jennifer Carrillo Blush OB: [birth year]  MR#: [id number]  RDW#:[id number]  Patient Care Team: Carrillo Jennifer [name] as PCP - General (Physician Assistant) Nicholaus Sherlean Carrillo, [facility] (Inactive) as Pharmacist (Pharmacist) Rennie Cindy SAUNDERS, MD as Consulting Physician (Oncology)  REFERRING PROVIDER: Tinnie Carrillo  REASON FOR REFERRAL: anemia of chronic disease  HPI: Jennifer Carrillo is a 89 y.o. female with past medical history of hypertension, hyperlipidemia, IDA, COPD, diabetes, left breast cancer, OSA not on CPAP, stroke, ESRD on HD was referred to hematology for normocytic anemia.  Patient has history of ESRD on HD Tuesday Thursday and Saturday regimen.  Follows with Dr. Dennise as outpatient.  She has been getting EPO injection and iron  infusions per nephrology.  Labs from June 2024 showed hemoglobin of 8.1, WBC 6.3 and platelet 300.  Iron  32, saturation 12% and ferritin of 273, B12 523, folate 15.8, TSH 2.25.  Patient has history of left breast cancer.  Oncology history obtained from Dr. Evalene Charter [facility] heme-onc.  Last seen in March 2021.  Oncology History Overview Note  11/10/15: Mammogram with 2 cm left breast abnormality; US  confirmed. Axillary negative.  12/07/15: Bx : Breast, left, 6 o'clock, core biopsy  - Invasive ductal carcinoma - Nottingham combined histologic grade: 1 Tubule formation: 2 Nuclear grade: 2 Mitotic score: 1 - Invasive carcinoma measures approximately 9 mm in this core biopsy specimen - Microcalcifications are associated with invasive carcinoma - ER 100%; PR 1-2%; Her 2+, FISH negative   12/20/15: Partial Mastectomy A: Breast, left savi guided partial mastectomy -Invasive ductal carcinoma with associated ductal carcinoma in situ (DCIS) (see synoptic) -Invasive carcinoma measures 14.0 mm in greatest dimension -Margin status in part A only (correlate with additional  parts to determine final margin status)      - Adenocarcinoma is greater than 2 mm from all surgical margins      - DCIS is focally positive at the posterior black-inked surgical margin; DCIS is greater than 2 mm from all other surgical margins -Ancillary studies previously reported on       - Estrogen receptor: Positive, 91-100%, strong      - Progesterone receptor: Margins positive, 1-10%, moderate      - HER2/Neu: 2+ equivocal FISH results HER2/Neu: Not amplified -See comment   B: Breast, left, posterior margin, excision - Ductal carcinoma in-situ present, 6.0 mm from the black-inked surgical margin - Negative for invasive carcinoma   C: Breast, left, medial margin, excision - Negative for in-situ and invasive carcinoma   D: Breast, left, inferior margin, excision - Negative for in-situ and invasive carcinoma   E: Breast, left, lateral margin, excision - Negative for in-situ and invasive carcinoma   F: Breast, left, superior margin, excision - Ductal carcinoma in-situ is 1.7 mm from the blue-inked surgical margin - Negative for invasive carcinoma  Malignant neoplasm of left female breast (CMS-HCC)  12/16/2015 Initial Diagnosis  Malignant neoplasm of breast in female, estrogen receptor positive (RAF-HCC)   On anastrozole  1 mg daily  2 sons, 1 daughter (2 died)    INTERVAL HISTORY: Jennifer Carrillo is a 89 y.o. female currently receiving hemodialysis with Dr. Dennise who returns to clinic for possible transfusion.  Patient previously has a history of melena and AVMs and underwent an enteroscopy with Dr. Unk on 07/24/2023.  In the interim, she has been receiving IV iron  infusions.  Continues dialysis 3 days a week.  She says that she  feels well today and her energy improved.  Today she is seen sitting in wheelchair with her son.  She reports overall doing well.  Hemoglobin is within normal limits today.  She reports having good energy.  Denies seeing any blood in urine sputum or  stool.  No concerns today.  She reports dialysis is going well.  Hg with in normal limits, no need for iron  today.    REVIEW OF SYSTEMS:   Review of Systems  Unable to perform ROS: Dementia  Constitutional:  Positive for malaise/fatigue.  HENT:  Negative for nosebleeds.   Respiratory:  Negative for shortness of breath.   Cardiovascular:  Negative for chest pain.  Gastrointestinal:  Negative for blood in stool and melena.  Genitourinary:  Negative for dysuria and urgency.  Musculoskeletal:  Positive for joint pain.  Neurological:  Negative for weakness.  Psychiatric/Behavioral:  Positive for memory loss.   As per HPI. Otherwise, a complete review of systems is negative.  PAST MEDICAL HISTORY: Past Medical History:  Diagnosis Date   Arthritis    Asthma    Calf pain    Cancer (HCC) 2016   left breast   COPD (chronic obstructive pulmonary disease) (HCC)    Diabetes (HCC)    Discoid lupus    Gastro-esophageal reflux    Glaucoma    Hyperlipemia    Hypertension    Iron  deficiency anemia    Joint pain    Leg swelling    Osteoarthritis    PVD (peripheral vascular disease)    Sinus problem    Sleep apnea    No CPAP/ Can't tolerate   Stroke (HCC) 1987    PAST SURGICAL HISTORY: Past Surgical History:  Procedure Laterality Date   BREAST SURGERY Left    left lumpectomy   CAROTID ANGIOGRAPHY Left 06/25/2018   Procedure: CAROTID ANGIOGRAPHY, possible intervention;  Surgeon: Jama Cordella MATSU, MD;  Location: [facility] INVASIVE CV LAB;  Service: Cardiovascular;  Laterality: Left;   CAROTID ENDARTERECTOMY Right    CAROTID PTA/STENT INTERVENTION Left 06/25/2018   Procedure: CAROTID PTA/STENT INTERVENTION;  Surgeon: Jama Cordella MATSU, MD;  Location: [facility] INVASIVE CV [facility];  Service: Cardiovascular;  Laterality: Left;   CATARACT EXTRACTION Right 2013   CORONARY STENT PLACEMENT     L. L. E.   DIALYSIS/PERMA CATHETER INSERTION N/A 12/29/2020   Procedure: DIALYSIS/PERMA CATHETER INSERTION;   Surgeon: Marea Selinda RAMAN, MD;  Location: [facility] INV[facility] CV [facility];  Service: Cardiovascular;  Laterality: N/A;   DIALYSIS/PERMA CATHETER INSERTION N/A 12/14/2021   Procedure: DIALYSIS/PERMA CATHETER INSERTION;  Surgeon: Jama Cordella MATSU, MD;  Location: [facility] INV[facility]IVE CV [facility];  Service: Cardiovascular;  Laterality: N/A;   ENTEROSCOPY N/A 07/24/2023   Procedure: ENTEROSCOPY;  Surgeon: Unk Corinn Skiff, MD;  Location: [facility] ENDOSCOPY;  Service: Gastroenterology;  Laterality: N/A;   ESOPHAGOGASTRODUODENOSCOPY N/A 05/28/2023   Procedure: EGD (ESOPHAGOGASTRODUODENOSCOPY);  Surgeon: Jinny Carmine, MD;  Location: [facility] ENDOSCOPY;  Service: Endoscopy;  Laterality: N/A;   EYE SURGERY Bilateral    cataract   HOT HEMOSTASIS  05/28/2023   Procedure: EGD, WITH ARGON PLASMA COAGULATION;  Surgeon: Jinny Carmine, MD;  Location: [facility] ENDOSCOPY;  Service: Endoscopy;;   HOT HEMOSTASIS  07/24/2023   Procedure: EGD, WITH ARGON PLASMA COAGULATION;  Surgeon: Unk Corinn Skiff, MD;  Location: [facility] ENDOSCOPY;  Service: Gastroenterology;;   LEFT HEART CATH AND CORONARY ANGIOGRAPHY Left 05/06/2018   Procedure: LEFT HEART CATH AND CORONARY ANGIOGRAPHY;  Surgeon: Hester Wolm PARAS, MD;  Location: [facility] CV [facility];  Service:  Cardiovascular;  Laterality: Left;   LOWER EXTREMITY ANGIOGRAPHY Right 01/22/2018   Procedure: LOWER EXTREMITY ANGIOGRAPHY;  Surgeon: Jama Cordella MATSU, MD;  Location: [facility];  Service: Cardiovascular;  Laterality: Right;   RENAL ANGIOGRAPHY Left 02/26/2018   Procedure: RENAL ANGIOGRAPHY;  Surgeon: Jama Cordella MATSU, MD;  Location: [facility] CV [facility];  Service: Cardiovascular;  Laterality: Left;   STENT PLACEMENT VASCULAR ([facility] HX) Left    stent placement on LLE   SUBMUCOSAL INJECTION  07/24/2023   Procedure: INJECTION, SUBMUCOSAL;  Surgeon: Unk Corinn Skiff, MD;  Location: [facility] ENDOSCOPY;  Service: Gastroenterology;;   TUBAL LIGATION     FAMILY HISTORY: Family History  Problem Relation  Age of Onset   Heart disease Mother    Diabetes Sister    Leukemia Daughter    Lung cancer Brother    Diabetes Other    Hypertension Other    Bladder Cancer Neg Hx    Kidney disease Neg Hx    Prostate cancer Neg Hx    Breast cancer Neg Hx    HEALTH MAINTENANCE: Social History   Tobacco Use   Smoking status: Former    Current packs/day: 0.00    Types: Cigarettes    Quit date: 1987    Years since quitting: 38.9   Smokeless tobacco: Never   Tobacco comments:    quit 31 years  Vaping Use   Vaping status: Never Used  Substance Use Topics   Alcohol use: No   Drug use: No   Allergies  Allergen Reactions   Benazepril Nausea Only   Statins Other (See Comments)    Myalgia  swelling    Outside Source Comment: swelling   Current Outpatient Medications  Medication Sig Dispense Refill   ACCU-CHEK GUIDE test strip TEST BLOOD SUGAR THREE TIMES DAILY  AS  DIRECTED 300 strip 1   Accu-Chek Softclix Lancets lancets USE AS INSTRUCTED 3 TIMES A DAY 300 each 3   acetaminophen  (TYLENOL ) 500 MG tablet Take 1 tablet (500 mg total) by mouth every 6 (six) hours as needed for moderate pain or headache. 30 tablet 0   albuterol  (VENTOLIN  HFA) 108 (90 Base) MCG/ACT inhaler Inhale 2 puffs into the lungs every 6 (six) hours as needed for wheezing or shortness of breath. 8 g 2   Alcohol Swabs (B-D SINGLE USE SWABS REGULAR) PADS Use as directed for 3 times daily DX E11.65 100 each 1   amLODipine  (NORVASC ) 5 MG tablet Skip the dose if systolic BP less than 140 mmHg 30 tablet 11   Blood Glucose Monitoring Suppl (ACCU-CHEK GUIDE ME) w/Device KIT Use as instructed. DX e11.65 1 kit 0   budesonide -glycopyrrolate -formoterol  (BREZTRI  AEROSPHERE) 160-9-4.8 MCG/ACT AERO inhaler Inhale 2 puffs into the lungs 2 (two) times daily. 10.7 g 11   Dextromethorphan -guaiFENesin  20-200 MG/20ML LIQD Take 10 mLs by mouth every 6 (six) hours as needed. 118 mL 0   donepezil  (ARICEPT ) 10 MG tablet TAKE 1 TABLET AT BEDTIME FOR  MEMORY (NEED MD APPOINTMENT) 90 tablet 3   ferrous sulfate  325 (65 FE) MG tablet Take 1 tablet (325 mg total) by mouth daily with breakfast. 30 tablet 2   hydrALAZINE  (APRESOLINE ) 100 MG tablet Take 100 mg by mouth 2 (two) times daily.     QUEtiapine  (SEROQUEL ) 50 MG tablet Take 1 tablet (50 mg total) by mouth every evening. 30 tablet 0   torsemide (DEMADEX) 100 MG tablet Take 100 mg by mouth daily.     No current facility-administered medications  for this visit.   OBJECTIVE: Vitals:   12/27/23 0947  BP: (!) 155/59  Pulse: 65  Resp: 20  Temp: 98.6 F (37 C)  SpO2: 100%      Body mass index is 26.58 kg/m.      General: Well-developed, well-nourished, no acute distress. Eyes: Pink conjunctiva, anicteric sclera. HEENT: Normocephalic, moist mucous membranes, clear oropharnyx. Lungs: Clear to auscultation bilaterally. Heart: Regular rate and rhythm. No rubs, murmurs, or gallops. Abdomen: Soft, nontender, nondistended. No organomegaly noted, normoactive bowel sounds. Musculoskeletal: No edema, cyanosis, or clubbing. Neuro: Alert. Dementia at baseline.  Skin: No rashes or petechiae noted. Psych: Normal affect. Lymphatics: No cervical, calvicular, axillary or inguinal LAD.   LAB RESULTS: Lab Results  Component Value Date   NA 144 12/27/2023   K 3.9 12/27/2023   CL 105 12/27/2023   CO2 24 12/27/2023   GLUCOSE 112 (H) 12/27/2023   BUN 37 (H) 12/27/2023   CREATININE 5.10 (H) 12/27/2023   CALCIUM  9.3 12/27/2023   PROT 7.5 12/27/2023   ALBUMIN 4.2 12/27/2023   AST 28 12/27/2023   ALT 39 12/27/2023   ALKPHOS 105 12/27/2023   BILITOT 0.6 12/27/2023   GFRNONAA 8 (L) 12/27/2023   GFRAA 31 (L) 02/13/2019   Lab Results  Component Value Date   WBC 4.5 12/27/2023   NEUTROABS 2.9 12/27/2023   HGB 12.2 12/27/2023   HCT 39.4 12/27/2023   MCV 83.8 12/27/2023   PLT 216 12/27/2023   Lab Results  Component Value Date   TIBC 259 12/27/2023   TIBC 270 11/29/2023   TIBC 255  09/26/2023   FERRITIN 509 (H) 12/27/2023   FERRITIN 439 (H) 11/29/2023   FERRITIN 339 (H) 09/26/2023   IRONPCTSAT 16 12/27/2023   IRONPCTSAT 19 11/29/2023   IRONPCTSAT 18 09/26/2023   STUDIES: No results found.  ASSESSMENT AND PLAN:   Jennifer Carrillo is a 89 y.o. female with pmh of hypertension, hyperlipidemia, IDA, COPD, diabetes, left breast cancer, OSA not on CPAP, stroke, ESRD on HD was referred to hematology for normocytic anemia.  # Iron  deficiency anemia- chronic anemia of CKD, stage V, on hemodialysis and iron  deficiency anemia due to chronic blood loss in setting of gastric and small bowel AVMs.  In May 2025 she presented to [facility] ER with melena and found to have a hemoglobin of 6.7.  She underwent EGD which found multiple AVMs in the duodenum, treated with APC.  Responded to transfusion and hemoglobin was 9.4 at time of discharge.  Hemoglobin dropped to 7.6 with dialysis and she was referred back to GI for further evaluation.  She is unsure if she has been receiving erythropoietin  during dialysis. - Goal ferritin around 500 in the setting of ESRD per Dr Clista - may consider long term octreotide to reduce risk of bleeding from small bowel AVMs, however Hg stable at this time - s/p enteroscopy with Dr. Unk- 1 single bleeding angioectasia treated with APC. Otherwise normal.  - Last iron  infusion 9/11.  Hg 12.2 today hold venofer  today Ferritin 509 iron  sat 16  # Symptomatic anemia- s/p transfusion on 08/12/23 for hmg 6.5.  - Symptomatically improved. Hmg 12.2 today - Hold transfusion. Plan to check counts monthly. Transfuse for hmg < 7.   # ESRD on HD  - Patient follows with Dr. Dennise nephrology as outpatient.  She is on dialysis Tuesday Thursday and Saturday. Patient has been getting EPO injections and IV iron  infusions per nephrology.  We had previously deferred her hematology  management to nephrology since she was on HD however, given drops in her counts due to AVMs/blood loss,  she was re-referred.  - Discussed the need to optimize iron  stores in the setting of end-stage renal disease.  - F/U with us  in 4 weeks for repeat labs and possible venofer  If counts drop, we can see her sooner.   # History of left breast cancer, stage Ia, ER 91 to 100% positive, 1 to 10% positive and HER2 negative. - Diagnosed in 2017.  Status post left lumpectomy at [facility].  Pathology showing IDC, 14 mm, margins negative for carcinoma, DCIS focally present at the posterior black inked margin.  ER 91 to 100% positive, PR 1 to 10% and HER2 negative by FISH.  She was treated by Dr. Evalene Charter heme-onc at [facility].  Last followed up in March 2021.  She is on anastrozole  1 mg daily since beginning of 2018.  Discussed with the patient that she had early stage hormone positive breast cancer with negative margins for carcinoma.  5 years of anastrozole  should be sufficient.  She has compelted 7 years of anastrozole . D/c d/t bone effects. - Last mammogram was in September 2024 was negative. Repeat mammogram ordered no completed yet encouraged patient to reach out and schedule  Pain in left knee: Patient with compression sleeve on knee today in clinic.  Reports aching pain and stiffness in left knee for a few weeks.  No falls or injuries.  No warmth, tenderness with palpation, no edema.  Likely arthritis.  Instructed her to apply biofreeze on it daily, she can apply a heating pad for comfort and referred to orthopedics.  Patient reports that the pain is making it hard for her to get up and down. resolving  Disposition:   No iron  today F/U in 8 weeks see np/mp cbc with diff, ferritin, iron  and TIBC LP    Patient expressed understanding and was in agreement with this plan. She also understands that She can call clinic at any time with any questions, concerns, or complaints.   Morna Husband, NP   12/27/2023   CC: Dr Unk   "

## 2024-01-17 ENCOUNTER — Ambulatory Visit: Admitting: Physician Assistant

## 2024-02-21 ENCOUNTER — Inpatient Hospital Stay: Admitting: Nurse Practitioner

## 2024-02-21 ENCOUNTER — Inpatient Hospital Stay
